# Patient Record
Sex: Male | Born: 1955 | Race: White | Hispanic: No | Marital: Married | Smoking: Never smoker
Health system: Southern US, Community
[De-identification: ages and names within clinical notes are randomized; demographics above are authoritative.]

## PROBLEM LIST (undated history)

## (undated) DIAGNOSIS — I499 Cardiac arrhythmia, unspecified: Secondary | ICD-10-CM

## (undated) DIAGNOSIS — M1990 Unspecified osteoarthritis, unspecified site: Secondary | ICD-10-CM

## (undated) DIAGNOSIS — Z87442 Personal history of urinary calculi: Secondary | ICD-10-CM

## (undated) DIAGNOSIS — N200 Calculus of kidney: Secondary | ICD-10-CM

## (undated) DIAGNOSIS — H00019 Hordeolum externum unspecified eye, unspecified eyelid: Secondary | ICD-10-CM

## (undated) DIAGNOSIS — G709 Myoneural disorder, unspecified: Secondary | ICD-10-CM

## (undated) DIAGNOSIS — C801 Malignant (primary) neoplasm, unspecified: Secondary | ICD-10-CM

## (undated) DIAGNOSIS — I639 Cerebral infarction, unspecified: Secondary | ICD-10-CM

## (undated) DIAGNOSIS — E785 Hyperlipidemia, unspecified: Secondary | ICD-10-CM

## (undated) DIAGNOSIS — D751 Secondary polycythemia: Secondary | ICD-10-CM

## (undated) DIAGNOSIS — G4730 Sleep apnea, unspecified: Secondary | ICD-10-CM

## (undated) DIAGNOSIS — I10 Essential (primary) hypertension: Secondary | ICD-10-CM

## (undated) DIAGNOSIS — K604 Rectal fistula, unspecified: Secondary | ICD-10-CM

## (undated) DIAGNOSIS — K5730 Diverticulosis of large intestine without perforation or abscess without bleeding: Secondary | ICD-10-CM

## (undated) DIAGNOSIS — D500 Iron deficiency anemia secondary to blood loss (chronic): Secondary | ICD-10-CM

## (undated) DIAGNOSIS — K589 Irritable bowel syndrome without diarrhea: Secondary | ICD-10-CM

## (undated) DIAGNOSIS — R011 Cardiac murmur, unspecified: Secondary | ICD-10-CM

## (undated) HISTORY — DX: Cardiac arrhythmia, unspecified: I49.9

## (undated) HISTORY — DX: Irritable bowel syndrome, unspecified: K58.9

## (undated) HISTORY — DX: Hordeolum externum unspecified eye, unspecified eyelid: H00.019

## (undated) HISTORY — DX: Rectal fistula, unspecified: K60.40

## (undated) HISTORY — DX: Calculus of kidney: N20.0

## (undated) HISTORY — DX: Iron deficiency anemia secondary to blood loss (chronic): D50.0

## (undated) HISTORY — DX: Secondary polycythemia: D75.1

## (undated) HISTORY — DX: Hyperlipidemia, unspecified: E78.5

## (undated) HISTORY — DX: Essential (primary) hypertension: I10

## (undated) HISTORY — PX: ROTATOR CUFF REPAIR: SHX139

## (undated) HISTORY — PX: VASECTOMY: SHX75

## (undated) HISTORY — DX: Diverticulosis of large intestine without perforation or abscess without bleeding: K57.30

## (undated) HISTORY — PX: RETINAL DETACHMENT SURGERY: SHX105

## (undated) HISTORY — DX: Rectal fistula: K60.4

---

## 1993-05-07 HISTORY — PX: ROTATOR CUFF REPAIR: SHX139

## 2001-10-24 ENCOUNTER — Ambulatory Visit (HOSPITAL_COMMUNITY): Admission: RE | Admit: 2001-10-24 | Discharge: 2001-10-24 | Payer: Self-pay | Admitting: Internal Medicine

## 2002-09-22 ENCOUNTER — Ambulatory Visit: Admission: RE | Admit: 2002-09-22 | Discharge: 2002-09-22 | Payer: Self-pay | Admitting: Hematology & Oncology

## 2002-09-22 ENCOUNTER — Encounter (INDEPENDENT_AMBULATORY_CARE_PROVIDER_SITE_OTHER): Payer: Self-pay | Admitting: Cardiology

## 2003-05-08 HISTORY — PX: RETINAL DETACHMENT SURGERY: SHX105

## 2004-02-08 ENCOUNTER — Ambulatory Visit (HOSPITAL_COMMUNITY): Admission: RE | Admit: 2004-02-08 | Discharge: 2004-02-09 | Payer: Self-pay | Admitting: Ophthalmology

## 2004-02-23 ENCOUNTER — Emergency Department (HOSPITAL_COMMUNITY): Admission: EM | Admit: 2004-02-23 | Discharge: 2004-02-23 | Payer: Self-pay | Admitting: *Deleted

## 2004-02-23 ENCOUNTER — Encounter (INDEPENDENT_AMBULATORY_CARE_PROVIDER_SITE_OTHER): Payer: Self-pay | Admitting: *Deleted

## 2004-03-09 ENCOUNTER — Ambulatory Visit: Payer: Self-pay | Admitting: Hematology & Oncology

## 2004-05-11 ENCOUNTER — Ambulatory Visit: Payer: Self-pay | Admitting: Hematology & Oncology

## 2004-06-30 ENCOUNTER — Ambulatory Visit: Payer: Self-pay | Admitting: Hematology & Oncology

## 2004-08-15 ENCOUNTER — Ambulatory Visit: Payer: Self-pay | Admitting: Hematology & Oncology

## 2004-09-04 ENCOUNTER — Ambulatory Visit (HOSPITAL_COMMUNITY): Admission: RE | Admit: 2004-09-04 | Discharge: 2004-09-04 | Payer: Self-pay | Admitting: Hematology & Oncology

## 2004-10-11 ENCOUNTER — Ambulatory Visit: Payer: Self-pay | Admitting: Hematology & Oncology

## 2004-12-05 ENCOUNTER — Ambulatory Visit: Payer: Self-pay | Admitting: Hematology & Oncology

## 2005-01-30 ENCOUNTER — Ambulatory Visit: Payer: Self-pay | Admitting: Hematology & Oncology

## 2005-03-26 ENCOUNTER — Ambulatory Visit: Payer: Self-pay | Admitting: Hematology & Oncology

## 2005-05-24 ENCOUNTER — Ambulatory Visit: Payer: Self-pay | Admitting: Hematology & Oncology

## 2005-07-18 ENCOUNTER — Ambulatory Visit: Payer: Self-pay | Admitting: Hematology & Oncology

## 2005-08-23 LAB — CBC WITH DIFFERENTIAL/PLATELET
BASO%: 1.5 % (ref 0.0–2.0)
Basophils Absolute: 0.1 10*3/uL (ref 0.0–0.1)
EOS%: 1.7 % (ref 0.0–7.0)
Eosinophils Absolute: 0.1 10*3/uL (ref 0.0–0.5)
HCT: 41.7 % (ref 38.7–49.9)
HGB: 14 g/dL (ref 13.0–17.1)
LYMPH%: 14.4 % (ref 14.0–48.0)
MCH: 27.4 pg — ABNORMAL LOW (ref 28.0–33.4)
MCHC: 33.5 g/dL (ref 32.0–35.9)
MCV: 81.8 fL (ref 81.6–98.0)
MONO#: 0.3 10*3/uL (ref 0.1–0.9)
MONO%: 7.8 % (ref 0.0–13.0)
NEUT#: 2.8 10*3/uL (ref 1.5–6.5)
NEUT%: 74.6 % (ref 40.0–75.0)
Platelets: 435 10*3/uL — ABNORMAL HIGH (ref 145–400)
RBC: 5.1 10*6/uL (ref 4.20–5.71)
RDW: 31.7 % — ABNORMAL HIGH (ref 11.2–14.6)
WBC: 3.7 10*3/uL — ABNORMAL LOW (ref 4.0–10.0)
lymph#: 0.5 10*3/uL — ABNORMAL LOW (ref 0.9–3.3)

## 2005-08-23 LAB — CHCC SMEAR

## 2005-08-23 LAB — FERRITIN: Ferritin: 10 ng/mL — ABNORMAL LOW (ref 22–322)

## 2005-08-30 ENCOUNTER — Ambulatory Visit (HOSPITAL_COMMUNITY): Admission: RE | Admit: 2005-08-30 | Discharge: 2005-08-30 | Payer: Self-pay | Admitting: Hematology & Oncology

## 2005-09-29 ENCOUNTER — Ambulatory Visit: Payer: Self-pay | Admitting: Hematology & Oncology

## 2005-10-04 LAB — CBC WITH DIFFERENTIAL/PLATELET
BASO%: 0.5 % (ref 0.0–2.0)
Basophils Absolute: 0 10e3/uL (ref 0.0–0.1)
EOS%: 2.3 % (ref 0.0–7.0)
Eosinophils Absolute: 0.1 10e3/uL (ref 0.0–0.5)
HCT: 43.2 % (ref 38.7–49.9)
HGB: 14.7 g/dL (ref 13.0–17.1)
LYMPH%: 20.7 % (ref 14.0–48.0)
MCH: 31.3 pg (ref 28.0–33.4)
MCHC: 34 g/dL (ref 32.0–35.9)
MCV: 91.9 fL (ref 81.6–98.0)
MONO#: 0.5 10e3/uL (ref 0.1–0.9)
MONO%: 9.2 % (ref 0.0–13.0)
NEUT#: 3.6 10e3/uL (ref 1.5–6.5)
NEUT%: 67.3 % (ref 40.0–75.0)
Platelets: 434 10e3/uL — ABNORMAL HIGH (ref 145–400)
RBC: 4.7 10e6/uL (ref 4.20–5.71)
RDW: 21.6 % — ABNORMAL HIGH (ref 11.2–14.6)
WBC: 5.3 10e3/uL (ref 4.0–10.0)
lymph#: 1.1 10e3/uL (ref 0.9–3.3)

## 2005-10-04 LAB — CHCC SMEAR

## 2005-11-26 ENCOUNTER — Ambulatory Visit: Payer: Self-pay | Admitting: Hematology & Oncology

## 2005-12-31 ENCOUNTER — Ambulatory Visit: Payer: Self-pay | Admitting: Internal Medicine

## 2006-01-11 ENCOUNTER — Ambulatory Visit: Payer: Self-pay | Admitting: Internal Medicine

## 2006-01-11 ENCOUNTER — Encounter (INDEPENDENT_AMBULATORY_CARE_PROVIDER_SITE_OTHER): Payer: Self-pay | Admitting: Specialist

## 2006-01-11 ENCOUNTER — Encounter (INDEPENDENT_AMBULATORY_CARE_PROVIDER_SITE_OTHER): Payer: Self-pay | Admitting: *Deleted

## 2006-01-22 ENCOUNTER — Ambulatory Visit: Payer: Self-pay | Admitting: Hematology & Oncology

## 2006-01-24 LAB — CBC WITH DIFFERENTIAL/PLATELET
BASO%: 1.7 % (ref 0.0–2.0)
Basophils Absolute: 0.1 10*3/uL (ref 0.0–0.1)
EOS%: 2.4 % (ref 0.0–7.0)
Eosinophils Absolute: 0.1 10*3/uL (ref 0.0–0.5)
HCT: 46.9 % (ref 38.7–49.9)
HGB: 16 g/dL (ref 13.0–17.1)
LYMPH%: 20.5 % (ref 14.0–48.0)
MCH: 30.3 pg (ref 28.0–33.4)
MCHC: 34 g/dL (ref 32.0–35.9)
MCV: 89 fL (ref 81.6–98.0)
MONO#: 0.4 10*3/uL (ref 0.1–0.9)
MONO%: 7.3 % (ref 0.0–13.0)
NEUT#: 3.4 10*3/uL (ref 1.5–6.5)
NEUT%: 68.1 % (ref 40.0–75.0)
Platelets: 515 10*3/uL — ABNORMAL HIGH (ref 145–400)
RBC: 5.27 10*6/uL (ref 4.20–5.71)
RDW: 15.7 % — ABNORMAL HIGH (ref 11.2–14.6)
WBC: 4.9 10*3/uL (ref 4.0–10.0)
lymph#: 1 10*3/uL (ref 0.9–3.3)

## 2006-01-24 LAB — CHCC SMEAR

## 2006-01-27 LAB — FERRITIN: Ferritin: 12 ng/mL — ABNORMAL LOW (ref 22–322)

## 2006-01-27 LAB — TRANSFERRIN RECEPTOR, SOLUABLE: Transferrin Receptor, Soluble: 9.6 mg/L — ABNORMAL HIGH (ref 2.2–5.0)

## 2006-03-19 ENCOUNTER — Ambulatory Visit: Payer: Self-pay | Admitting: Hematology & Oncology

## 2006-03-21 LAB — CBC WITH DIFFERENTIAL/PLATELET
BASO%: 1.4 % (ref 0.0–2.0)
Basophils Absolute: 0.1 10*3/uL (ref 0.0–0.1)
EOS%: 2.7 % (ref 0.0–7.0)
Eosinophils Absolute: 0.1 10*3/uL (ref 0.0–0.5)
HCT: 47.1 % (ref 38.7–49.9)
HGB: 15.9 g/dL (ref 13.0–17.1)
LYMPH%: 18 % (ref 14.0–48.0)
MCH: 30.5 pg (ref 28.0–33.4)
MCHC: 33.8 g/dL (ref 32.0–35.9)
MCV: 90.5 fL (ref 81.6–98.0)
MONO#: 0.5 10*3/uL (ref 0.1–0.9)
MONO%: 8.9 % (ref 0.0–13.0)
NEUT#: 3.8 10*3/uL (ref 1.5–6.5)
NEUT%: 69 % (ref 40.0–75.0)
Platelets: 440 10*3/uL — ABNORMAL HIGH (ref 145–400)
RBC: 5.2 10*6/uL (ref 4.20–5.71)
RDW: 18.2 % — ABNORMAL HIGH (ref 11.2–14.6)
WBC: 5.5 10*3/uL (ref 4.0–10.0)
lymph#: 1 10*3/uL (ref 0.9–3.3)

## 2006-04-18 LAB — COMPREHENSIVE METABOLIC PANEL
ALT: 18 U/L (ref 0–53)
AST: 18 U/L (ref 0–37)
Albumin: 5 g/dL (ref 3.5–5.2)
Alkaline Phosphatase: 44 U/L (ref 39–117)
BUN: 14 mg/dL (ref 6–23)
CO2: 25 mEq/L (ref 19–32)
Calcium: 9.3 mg/dL (ref 8.4–10.5)
Chloride: 105 mEq/L (ref 96–112)
Creatinine, Ser: 1.09 mg/dL (ref 0.40–1.50)
Glucose, Bld: 92 mg/dL (ref 70–99)
Potassium: 4 mEq/L (ref 3.5–5.3)
Sodium: 140 mEq/L (ref 135–145)
Total Bilirubin: 0.8 mg/dL (ref 0.3–1.2)
Total Protein: 6.7 g/dL (ref 6.0–8.3)

## 2006-04-18 LAB — CBC WITH DIFFERENTIAL/PLATELET
BASO%: 1.3 % (ref 0.0–2.0)
Basophils Absolute: 0.1 10*3/uL (ref 0.0–0.1)
EOS%: 2.4 % (ref 0.0–7.0)
Eosinophils Absolute: 0.1 10*3/uL (ref 0.0–0.5)
HCT: 46.3 % (ref 38.7–49.9)
HGB: 15.8 g/dL (ref 13.0–17.1)
LYMPH%: 18.3 % (ref 14.0–48.0)
MCH: 30.9 pg (ref 28.0–33.4)
MCHC: 34.1 g/dL (ref 32.0–35.9)
MCV: 90.7 fL (ref 81.6–98.0)
MONO#: 0.4 10*3/uL (ref 0.1–0.9)
MONO%: 7.6 % (ref 0.0–13.0)
NEUT#: 4 10*3/uL (ref 1.5–6.5)
NEUT%: 70.4 % (ref 40.0–75.0)
Platelets: 400 10*3/uL (ref 145–400)
RBC: 5.1 10*6/uL (ref 4.20–5.71)
RDW: 17 % — ABNORMAL HIGH (ref 11.2–14.6)
WBC: 5.7 10*3/uL (ref 4.0–10.0)
lymph#: 1 10*3/uL (ref 0.9–3.3)

## 2006-04-18 LAB — FERRITIN: Ferritin: 7 ng/mL — ABNORMAL LOW (ref 22–322)

## 2006-05-13 ENCOUNTER — Ambulatory Visit: Payer: Self-pay | Admitting: Hematology & Oncology

## 2006-05-16 LAB — CBC WITH DIFFERENTIAL/PLATELET
BASO%: 0.4 % (ref 0.0–2.0)
Basophils Absolute: 0 10*3/uL (ref 0.0–0.1)
EOS%: 1.8 % (ref 0.0–7.0)
Eosinophils Absolute: 0.1 10*3/uL (ref 0.0–0.5)
HCT: 41.9 % (ref 38.7–49.9)
HGB: 14.3 g/dL (ref 13.0–17.1)
LYMPH%: 12.7 % — ABNORMAL LOW (ref 14.0–48.0)
MCH: 31.3 pg (ref 28.0–33.4)
MCHC: 34.2 g/dL (ref 32.0–35.9)
MCV: 91.4 fL (ref 81.6–98.0)
MONO#: 0.4 10*3/uL (ref 0.1–0.9)
MONO%: 5.7 % (ref 0.0–13.0)
NEUT#: 5.2 10*3/uL (ref 1.5–6.5)
NEUT%: 79.4 % — ABNORMAL HIGH (ref 40.0–75.0)
Platelets: 373 10*3/uL (ref 145–400)
RBC: 4.59 10*6/uL (ref 4.20–5.71)
RDW: 17 % — ABNORMAL HIGH (ref 11.2–14.6)
WBC: 6.6 10*3/uL (ref 4.0–10.0)
lymph#: 0.8 10*3/uL — ABNORMAL LOW (ref 0.9–3.3)

## 2006-06-13 LAB — CBC WITH DIFFERENTIAL/PLATELET
BASO%: 1 % (ref 0.0–2.0)
Basophils Absolute: 0.1 10*3/uL (ref 0.0–0.1)
EOS%: 2.1 % (ref 0.0–7.0)
Eosinophils Absolute: 0.1 10*3/uL (ref 0.0–0.5)
HCT: 41.2 % (ref 38.7–49.9)
HGB: 14.3 g/dL (ref 13.0–17.1)
LYMPH%: 19.7 % (ref 14.0–48.0)
MCH: 31.4 pg (ref 28.0–33.4)
MCHC: 34.7 g/dL (ref 32.0–35.9)
MCV: 90.4 fL (ref 81.6–98.0)
MONO#: 0.4 10*3/uL (ref 0.1–0.9)
MONO%: 7.2 % (ref 0.0–13.0)
NEUT#: 3.7 10*3/uL (ref 1.5–6.5)
NEUT%: 70 % (ref 40.0–75.0)
Platelets: 426 10*3/uL — ABNORMAL HIGH (ref 145–400)
RBC: 4.55 10*6/uL (ref 4.20–5.71)
RDW: 15.6 % — ABNORMAL HIGH (ref 11.2–14.6)
WBC: 5.3 10*3/uL (ref 4.0–10.0)
lymph#: 1 10*3/uL (ref 0.9–3.3)

## 2006-07-08 ENCOUNTER — Ambulatory Visit: Payer: Self-pay | Admitting: Hematology & Oncology

## 2006-07-11 LAB — CBC WITH DIFFERENTIAL/PLATELET
BASO%: 1.5 % (ref 0.0–2.0)
Basophils Absolute: 0.1 10*3/uL (ref 0.0–0.1)
EOS%: 3.9 % (ref 0.0–7.0)
Eosinophils Absolute: 0.2 10*3/uL (ref 0.0–0.5)
HCT: 45.3 % (ref 38.7–49.9)
HGB: 15.8 g/dL (ref 13.0–17.1)
LYMPH%: 21 % (ref 14.0–48.0)
MCH: 30.9 pg (ref 28.0–33.4)
MCHC: 35 g/dL (ref 32.0–35.9)
MCV: 88.3 fL (ref 81.6–98.0)
MONO#: 0.5 10*3/uL (ref 0.1–0.9)
MONO%: 8.5 % (ref 0.0–13.0)
NEUT#: 3.9 10*3/uL (ref 1.5–6.5)
NEUT%: 65.1 % (ref 40.0–75.0)
Platelets: 494 10*3/uL — ABNORMAL HIGH (ref 145–400)
RBC: 5.12 10*6/uL (ref 4.20–5.71)
RDW: 14.4 % (ref 11.2–14.6)
WBC: 6.1 10*3/uL (ref 4.0–10.0)
lymph#: 1.3 10*3/uL (ref 0.9–3.3)

## 2006-08-20 ENCOUNTER — Ambulatory Visit: Payer: Self-pay | Admitting: Hematology & Oncology

## 2006-08-22 LAB — CBC WITH DIFFERENTIAL/PLATELET
BASO%: 2.7 % — ABNORMAL HIGH (ref 0.0–2.0)
Basophils Absolute: 0.2 10*3/uL — ABNORMAL HIGH (ref 0.0–0.1)
EOS%: 2.1 % (ref 0.0–7.0)
Eosinophils Absolute: 0.1 10*3/uL (ref 0.0–0.5)
HCT: 41.5 % (ref 38.7–49.9)
HGB: 14.4 g/dL (ref 13.0–17.1)
LYMPH%: 16.2 % (ref 14.0–48.0)
MCH: 29.3 pg (ref 28.0–33.4)
MCHC: 34.8 g/dL (ref 32.0–35.9)
MCV: 84.4 fL (ref 81.6–98.0)
MONO#: 0.4 10*3/uL (ref 0.1–0.9)
MONO%: 6.9 % (ref 0.0–13.0)
NEUT#: 4.5 10*3/uL (ref 1.5–6.5)
NEUT%: 72.1 % (ref 40.0–75.0)
Platelets: 384 10*3/uL (ref 145–400)
RBC: 4.92 10*6/uL (ref 4.20–5.71)
RDW: 12.8 % (ref 11.2–14.6)
WBC: 6.3 10*3/uL (ref 4.0–10.0)
lymph#: 1 10*3/uL (ref 0.9–3.3)

## 2006-10-10 ENCOUNTER — Ambulatory Visit: Payer: Self-pay | Admitting: Hematology & Oncology

## 2006-10-15 LAB — CBC WITH DIFFERENTIAL/PLATELET
BASO%: 2.6 % — ABNORMAL HIGH (ref 0.0–2.0)
Basophils Absolute: 0.2 10*3/uL — ABNORMAL HIGH (ref 0.0–0.1)
EOS%: 2.7 % (ref 0.0–7.0)
Eosinophils Absolute: 0.2 10*3/uL (ref 0.0–0.5)
HCT: 48.1 % (ref 38.7–49.9)
HGB: 15.9 g/dL (ref 13.0–17.1)
LYMPH%: 13.8 % — ABNORMAL LOW (ref 14.0–48.0)
MCH: 27.3 pg — ABNORMAL LOW (ref 28.0–33.4)
MCHC: 33.1 g/dL (ref 32.0–35.9)
MCV: 82.4 fL (ref 81.6–98.0)
MONO#: 0.5 10*3/uL (ref 0.1–0.9)
MONO%: 6.5 % (ref 0.0–13.0)
NEUT#: 5.4 10*3/uL (ref 1.5–6.5)
NEUT%: 74.4 % (ref 40.0–75.0)
Platelets: 303 10*3/uL (ref 145–400)
RBC: 5.83 10*6/uL — ABNORMAL HIGH (ref 4.20–5.71)
RDW: 14.4 % (ref 11.2–14.6)
WBC: 7.3 10*3/uL (ref 4.0–10.0)
lymph#: 1 10*3/uL (ref 0.9–3.3)

## 2006-12-25 ENCOUNTER — Ambulatory Visit: Payer: Self-pay | Admitting: Hematology & Oncology

## 2006-12-27 LAB — CBC & DIFF AND RETIC
BASO%: 1.9 % (ref 0.0–2.0)
Basophils Absolute: 0.1 10*3/uL (ref 0.0–0.1)
EOS%: 2.6 % (ref 0.0–7.0)
Eosinophils Absolute: 0.1 10*3/uL (ref 0.0–0.5)
HCT: 44.4 % (ref 38.7–49.9)
HGB: 15.6 g/dL (ref 13.0–17.1)
IRF: 0.34 (ref 0.070–0.380)
LYMPH%: 20.9 % (ref 14.0–48.0)
MCH: 30.1 pg (ref 28.0–33.4)
MCHC: 35.2 g/dL (ref 32.0–35.9)
MCV: 85.6 fL (ref 81.6–98.0)
MONO#: 0.4 10*3/uL (ref 0.1–0.9)
MONO%: 6.7 % (ref 0.0–13.0)
NEUT#: 3.9 10*3/uL (ref 1.5–6.5)
NEUT%: 67.9 % (ref 40.0–75.0)
Platelets: 365 10*3/uL (ref 145–400)
RBC: 5.19 10*6/uL (ref 4.20–5.71)
RDW: 21.8 % — ABNORMAL HIGH (ref 11.2–14.6)
RETIC #: 90.8 10*3/uL (ref 31.8–103.9)
Retic %: 1.8 % (ref 0.7–2.3)
WBC: 5.7 10*3/uL (ref 4.0–10.0)
lymph#: 1.2 10*3/uL (ref 0.9–3.3)

## 2006-12-27 LAB — CHCC SMEAR

## 2006-12-28 LAB — FERRITIN: Ferritin: 9 ng/mL — ABNORMAL LOW (ref 22–322)

## 2007-02-18 ENCOUNTER — Ambulatory Visit: Payer: Self-pay | Admitting: Hematology & Oncology

## 2007-02-20 LAB — CBC WITH DIFFERENTIAL/PLATELET
BASO%: 1.2 % (ref 0.0–2.0)
Basophils Absolute: 0.1 10*3/uL (ref 0.0–0.1)
EOS%: 2.2 % (ref 0.0–7.0)
Eosinophils Absolute: 0.1 10*3/uL (ref 0.0–0.5)
HCT: 43.5 % (ref 38.7–49.9)
HGB: 15.3 g/dL (ref 13.0–17.1)
LYMPH%: 20.5 % (ref 14.0–48.0)
MCH: 32.9 pg (ref 28.0–33.4)
MCHC: 35.2 g/dL (ref 32.0–35.9)
MCV: 93.3 fL (ref 81.6–98.0)
MONO#: 0.5 10*3/uL (ref 0.1–0.9)
MONO%: 10 % (ref 0.0–13.0)
NEUT#: 3.5 10*3/uL (ref 1.5–6.5)
NEUT%: 66.1 % (ref 40.0–75.0)
Platelets: 364 10*3/uL (ref 145–400)
RBC: 4.66 10*6/uL (ref 4.20–5.71)
RDW: 20.7 % — ABNORMAL HIGH (ref 11.2–14.6)
WBC: 5.3 10*3/uL (ref 4.0–10.0)
lymph#: 1.1 10*3/uL (ref 0.9–3.3)

## 2007-04-22 ENCOUNTER — Ambulatory Visit: Payer: Self-pay | Admitting: Hematology & Oncology

## 2007-04-24 LAB — CBC WITH DIFFERENTIAL/PLATELET
BASO%: 2.1 % — ABNORMAL HIGH (ref 0.0–2.0)
Basophils Absolute: 0.1 10*3/uL (ref 0.0–0.1)
EOS%: 2.2 % (ref 0.0–7.0)
Eosinophils Absolute: 0.1 10*3/uL (ref 0.0–0.5)
HCT: UNDETERMINED % (ref 38.7–49.9)
HGB: 14.4 g/dL (ref 13.0–17.1)
LYMPH%: 18.4 % (ref 14.0–48.0)
MCH: UNDETERMINED pg (ref 28.0–33.4)
MCHC: UNDETERMINED g/dL (ref 32.0–35.9)
MCV: UNDETERMINED fL (ref 81.6–98.0)
MONO#: 0.4 10*3/uL (ref 0.1–0.9)
MONO%: 7.1 % (ref 0.0–13.0)
NEUT#: 3.5 10*3/uL (ref 1.5–6.5)
NEUT%: 70.2 % (ref 40.0–75.0)
Platelets: 342 10*3/uL (ref 145–400)
RBC: UNDETERMINED 10*6/uL (ref 4.20–5.71)
RDW: 15.2 % — ABNORMAL HIGH (ref 11.2–14.6)
WBC: 5 10*3/uL (ref 4.0–10.0)
lymph#: 0.9 10*3/uL (ref 0.9–3.3)

## 2007-04-24 LAB — FERRITIN: Ferritin: 51 ng/mL (ref 22–322)

## 2007-04-24 LAB — CHCC SMEAR

## 2007-06-24 ENCOUNTER — Ambulatory Visit: Payer: Self-pay | Admitting: Hematology & Oncology

## 2007-06-26 LAB — CBC WITH DIFFERENTIAL/PLATELET
BASO%: 0.6 % (ref 0.0–2.0)
Basophils Absolute: 0 10*3/uL (ref 0.0–0.1)
EOS%: 2.3 % (ref 0.0–7.0)
Eosinophils Absolute: 0.1 10*3/uL (ref 0.0–0.5)
HCT: 43.6 % (ref 38.7–49.9)
HGB: 15.9 g/dL (ref 13.0–17.1)
LYMPH%: 11.8 % — ABNORMAL LOW (ref 14.0–48.0)
MCH: 39.2 pg — ABNORMAL HIGH (ref 28.0–33.4)
MCHC: 36.5 g/dL — ABNORMAL HIGH (ref 32.0–35.9)
MCV: 107.3 fL — ABNORMAL HIGH (ref 81.6–98.0)
MONO#: 0.5 10*3/uL (ref 0.1–0.9)
MONO%: 7.9 % (ref 0.0–13.0)
NEUT#: 5.1 10*3/uL (ref 1.5–6.5)
NEUT%: 77.4 % — ABNORMAL HIGH (ref 40.0–75.0)
Platelets: 478 10*3/uL — ABNORMAL HIGH (ref 145–400)
RBC: 4.07 10*6/uL — ABNORMAL LOW (ref 4.20–5.71)
RDW: 13.3 % (ref 11.2–14.6)
WBC: 6.6 10*3/uL (ref 4.0–10.0)
lymph#: 0.8 10*3/uL — ABNORMAL LOW (ref 0.9–3.3)

## 2007-07-14 ENCOUNTER — Ambulatory Visit (HOSPITAL_COMMUNITY): Admission: RE | Admit: 2007-07-14 | Discharge: 2007-07-14 | Payer: Self-pay | Admitting: Hematology & Oncology

## 2007-07-14 ENCOUNTER — Encounter (INDEPENDENT_AMBULATORY_CARE_PROVIDER_SITE_OTHER): Payer: Self-pay | Admitting: *Deleted

## 2007-08-20 ENCOUNTER — Ambulatory Visit: Payer: Self-pay | Admitting: Hematology & Oncology

## 2007-08-22 LAB — CBC WITH DIFFERENTIAL/PLATELET
BASO%: 0.4 % (ref 0.0–2.0)
Basophils Absolute: 0 10*3/uL (ref 0.0–0.1)
EOS%: 1.9 % (ref 0.0–7.0)
Eosinophils Absolute: 0.1 10*3/uL (ref 0.0–0.5)
HCT: 44.6 % (ref 38.7–49.9)
HGB: 16.1 g/dL (ref 13.0–17.1)
LYMPH%: 17.4 % (ref 14.0–48.0)
MCH: 38.1 pg — ABNORMAL HIGH (ref 28.0–33.4)
MCHC: 36.1 g/dL — ABNORMAL HIGH (ref 32.0–35.9)
MCV: 105.6 fL — ABNORMAL HIGH (ref 81.6–98.0)
MONO#: 0.3 10*3/uL (ref 0.1–0.9)
MONO%: 5.2 % (ref 0.0–13.0)
NEUT#: 3.7 10*3/uL (ref 1.5–6.5)
NEUT%: 75.1 % — ABNORMAL HIGH (ref 40.0–75.0)
Platelets: 465 10*3/uL — ABNORMAL HIGH (ref 145–400)
RBC: 4.23 10*6/uL (ref 4.20–5.71)
RDW: 14.2 % (ref 11.2–14.6)
WBC: 5 10*3/uL (ref 4.0–10.0)
lymph#: 0.9 10*3/uL (ref 0.9–3.3)

## 2007-08-22 LAB — CHCC SMEAR

## 2007-09-03 LAB — JAK2 GENOTYPR

## 2007-09-03 LAB — FERRITIN: Ferritin: 67 ng/mL (ref 22–322)

## 2007-10-21 ENCOUNTER — Ambulatory Visit: Payer: Self-pay | Admitting: Hematology & Oncology

## 2007-10-23 LAB — FERRITIN: Ferritin: 13 ng/mL — ABNORMAL LOW (ref 22–322)

## 2007-10-23 LAB — CBC WITH DIFFERENTIAL/PLATELET
BASO%: 1.3 % (ref 0.0–2.0)
Basophils Absolute: 0.1 10*3/uL (ref 0.0–0.1)
EOS%: 2.1 % (ref 0.0–7.0)
Eosinophils Absolute: 0.1 10*3/uL (ref 0.0–0.5)
HCT: 48 % (ref 38.7–49.9)
HGB: 16.7 g/dL (ref 13.0–17.1)
LYMPH%: 15.6 % (ref 14.0–48.0)
MCH: 35 pg — ABNORMAL HIGH (ref 28.0–33.4)
MCHC: 34.8 g/dL (ref 32.0–35.9)
MCV: 100.6 fL — ABNORMAL HIGH (ref 81.6–98.0)
MONO#: 0.5 10*3/uL (ref 0.1–0.9)
MONO%: 8.7 % (ref 0.0–13.0)
NEUT#: 4.3 10*3/uL (ref 1.5–6.5)
NEUT%: 72.3 % (ref 40.0–75.0)
Platelets: 394 10*3/uL (ref 145–400)
RBC: 4.77 10*6/uL (ref 4.20–5.71)
RDW: 14.4 % (ref 11.2–14.6)
WBC: 6 10*3/uL (ref 4.0–10.0)
lymph#: 0.9 10*3/uL (ref 0.9–3.3)

## 2007-11-06 LAB — CBC WITH DIFFERENTIAL/PLATELET
BASO%: 0.4 % (ref 0.0–2.0)
Basophils Absolute: 0 10*3/uL (ref 0.0–0.1)
EOS%: 2.6 % (ref 0.0–7.0)
Eosinophils Absolute: 0.2 10*3/uL (ref 0.0–0.5)
HCT: 43.9 % (ref 38.7–49.9)
HGB: 15.2 g/dL (ref 13.0–17.1)
LYMPH%: 16.4 % (ref 14.0–48.0)
MCH: 33.1 pg (ref 28.0–33.4)
MCHC: 34.7 g/dL (ref 32.0–35.9)
MCV: 95.3 fL (ref 81.6–98.0)
MONO#: 0.5 10*3/uL (ref 0.1–0.9)
MONO%: 7.1 % (ref 0.0–13.0)
NEUT#: 5.4 10*3/uL (ref 1.5–6.5)
NEUT%: 73.5 % (ref 40.0–75.0)
Platelets: 507 10*3/uL — ABNORMAL HIGH (ref 145–400)
RBC: 4.61 10*6/uL (ref 4.20–5.71)
RDW: 14.9 % — ABNORMAL HIGH (ref 11.2–14.6)
WBC: 7.3 10*3/uL (ref 4.0–10.0)
lymph#: 1.2 10*3/uL (ref 0.9–3.3)

## 2007-12-23 ENCOUNTER — Ambulatory Visit: Payer: Self-pay | Admitting: Hematology & Oncology

## 2007-12-25 LAB — CBC WITH DIFFERENTIAL (CANCER CENTER ONLY)
BASO#: 0 10*3/uL (ref 0.0–0.2)
BASO%: 0.4 % (ref 0.0–2.0)
EOS%: 4.4 % (ref 0.0–7.0)
Eosinophils Absolute: 0.2 10*3/uL (ref 0.0–0.5)
HCT: 45.6 % (ref 38.7–49.9)
HGB: 15.4 g/dL (ref 13.0–17.1)
LYMPH#: 0.9 10*3/uL (ref 0.9–3.3)
LYMPH%: 21.5 % (ref 14.0–48.0)
MCH: 28.9 pg (ref 28.0–33.4)
MCHC: 33.8 g/dL (ref 32.0–35.9)
MCV: 86 fL (ref 82–98)
MONO#: 0.3 10*3/uL (ref 0.1–0.9)
MONO%: 7.4 % (ref 0.0–13.0)
NEUT#: 2.7 10*3/uL (ref 1.5–6.5)
NEUT%: 66.3 % (ref 40.0–80.0)
Platelets: 337 10*3/uL (ref 145–400)
RBC: 5.32 10*6/uL (ref 4.20–5.70)
RDW: 14.7 % — ABNORMAL HIGH (ref 10.5–14.6)
WBC: 4.1 10*3/uL (ref 4.0–10.0)

## 2007-12-25 LAB — FERRITIN: Ferritin: 7 ng/mL — ABNORMAL LOW (ref 22–322)

## 2007-12-25 LAB — CHCC SATELLITE - SMEAR

## 2008-02-11 ENCOUNTER — Ambulatory Visit: Payer: Self-pay | Admitting: Hematology & Oncology

## 2008-02-12 LAB — CBC WITH DIFFERENTIAL (CANCER CENTER ONLY)
HCT: 47.3 % (ref 38.7–49.9)
HGB: 16 g/dL (ref 13.0–17.1)
MCH: 28 pg (ref 28.0–33.4)
MCHC: 33.9 g/dL (ref 32.0–35.9)
MCV: 83 fL (ref 82–98)
Platelets: 291 10*3/uL (ref 145–400)
RBC: 5.72 10*6/uL — ABNORMAL HIGH (ref 4.20–5.70)
RDW: 19.7 % — ABNORMAL HIGH (ref 10.5–14.6)
WBC: 4.7 10*3/uL (ref 4.0–10.0)

## 2008-02-12 LAB — MANUAL DIFFERENTIAL (CHCC SATELLITE)
ALC: 1.4 10*3/uL (ref 0.9–3.3)
ANC (CHCC HP manual diff): 2.9 10*3/uL (ref 1.5–6.5)
BASO: 2 % (ref 0–2)
Eos: 4 % (ref 0–7)
LYMPH: 29 % (ref 14–48)
MONO: 4 % (ref 0–13)
PLT EST ~~LOC~~: ADEQUATE
SEG: 61 % (ref 40–75)

## 2008-04-07 ENCOUNTER — Ambulatory Visit: Payer: Self-pay | Admitting: Hematology & Oncology

## 2008-04-08 LAB — CBC WITH DIFFERENTIAL (CANCER CENTER ONLY)
BASO#: 0.1 10*3/uL (ref 0.0–0.2)
BASO%: 1.4 % (ref 0.0–2.0)
EOS%: 3.7 % (ref 0.0–7.0)
Eosinophils Absolute: 0.3 10*3/uL (ref 0.0–0.5)
HCT: 46.6 % (ref 38.7–49.9)
HGB: 15.7 g/dL (ref 13.0–17.1)
LYMPH#: 1.4 10*3/uL (ref 0.9–3.3)
LYMPH%: 19.1 % (ref 14.0–48.0)
MCH: 28.5 pg (ref 28.0–33.4)
MCHC: 33.8 g/dL (ref 32.0–35.9)
MCV: 84 fL (ref 82–98)
MONO#: 0.5 10*3/uL (ref 0.1–0.9)
MONO%: 6.7 % (ref 0.0–13.0)
NEUT#: 5.2 10*3/uL (ref 1.5–6.5)
NEUT%: 69.1 % (ref 40.0–80.0)
Platelets: 456 10*3/uL — ABNORMAL HIGH (ref 145–400)
RBC: 5.53 10*6/uL (ref 4.20–5.70)
RDW: 14.5 % (ref 10.5–14.6)
WBC: 7.5 10*3/uL (ref 4.0–10.0)

## 2008-04-08 LAB — FERRITIN: Ferritin: 9 ng/mL — ABNORMAL LOW (ref 22–322)

## 2008-04-08 LAB — CHCC SATELLITE - SMEAR

## 2008-04-09 LAB — URINALYSIS, MICROSCOPIC (CHCC SATELLITE)
Bacteria, UA: NEGATIVE
Bilirubin (Urine): NEGATIVE
Blood: NEGATIVE
Glucose: NEGATIVE g/dL
Ketones: 5 mg/dL
Leukocyte Esterase: NEGATIVE
Nitrite: NEGATIVE
Protein: <30 mg/dL
RBC: NEGATIVE (ref 0–2)
Specific Gravity, Urine: 1.015 (ref 1.003–1.035)
pH: 6 (ref 4.60–8.00)

## 2008-04-11 LAB — URINE CULTURE

## 2008-04-15 LAB — CBC WITH DIFFERENTIAL (CANCER CENTER ONLY)
BASO#: 0 10*3/uL (ref 0.0–0.2)
BASO%: 0.6 % (ref 0.0–2.0)
EOS%: 4 % (ref 0.0–7.0)
Eosinophils Absolute: 0.3 10*3/uL (ref 0.0–0.5)
HCT: 44.4 % (ref 38.7–49.9)
HGB: 14.9 g/dL (ref 13.0–17.1)
LYMPH#: 1.1 10*3/uL (ref 0.9–3.3)
LYMPH%: 15.2 % (ref 14.0–48.0)
MCH: 27.8 pg — ABNORMAL LOW (ref 28.0–33.4)
MCHC: 33.5 g/dL (ref 32.0–35.9)
MCV: 83 fL (ref 82–98)
MONO#: 0.6 10*3/uL (ref 0.1–0.9)
MONO%: 8.1 % (ref 0.0–13.0)
NEUT#: 5.1 10*3/uL (ref 1.5–6.5)
NEUT%: 72.1 % (ref 40.0–80.0)
Platelets: 577 10*3/uL — ABNORMAL HIGH (ref 145–400)
RBC: 5.34 10*6/uL (ref 4.20–5.70)
RDW: 13.8 % (ref 10.5–14.6)
WBC: 7.1 10*3/uL (ref 4.0–10.0)

## 2008-04-22 LAB — CBC WITH DIFFERENTIAL (CANCER CENTER ONLY)
BASO#: 0.1 10*3/uL (ref 0.0–0.2)
BASO%: 0.9 % (ref 0.0–2.0)
EOS%: 3.6 % (ref 0.0–7.0)
Eosinophils Absolute: 0.3 10*3/uL (ref 0.0–0.5)
HCT: 42.2 % (ref 38.7–49.9)
HGB: 14.2 g/dL (ref 13.0–17.1)
LYMPH#: 1.3 10*3/uL (ref 0.9–3.3)
LYMPH%: 18.6 % (ref 14.0–48.0)
MCH: 27.4 pg — ABNORMAL LOW (ref 28.0–33.4)
MCHC: 33.6 g/dL (ref 32.0–35.9)
MCV: 82 fL (ref 82–98)
MONO#: 0.5 10*3/uL (ref 0.1–0.9)
MONO%: 7.1 % (ref 0.0–13.0)
NEUT#: 4.9 10*3/uL (ref 1.5–6.5)
NEUT%: 69.8 % (ref 40.0–80.0)
Platelets: 572 10*3/uL — ABNORMAL HIGH (ref 145–400)
RBC: 5.18 10*6/uL (ref 4.20–5.70)
RDW: 14.5 % (ref 10.5–14.6)
WBC: 7 10*3/uL (ref 4.0–10.0)

## 2008-05-19 LAB — FECAL OCCULT BLOOD, GUIAC - CHCC SATELLITE: Occult Blood: NEGATIVE

## 2008-05-26 ENCOUNTER — Ambulatory Visit: Payer: Self-pay | Admitting: Hematology & Oncology

## 2008-05-27 ENCOUNTER — Encounter: Payer: Self-pay | Admitting: Internal Medicine

## 2008-06-03 DIAGNOSIS — D751 Secondary polycythemia: Secondary | ICD-10-CM | POA: Insufficient documentation

## 2008-06-03 DIAGNOSIS — K5730 Diverticulosis of large intestine without perforation or abscess without bleeding: Secondary | ICD-10-CM | POA: Insufficient documentation

## 2008-06-03 DIAGNOSIS — E785 Hyperlipidemia, unspecified: Secondary | ICD-10-CM | POA: Insufficient documentation

## 2008-06-08 ENCOUNTER — Ambulatory Visit: Payer: Self-pay | Admitting: Internal Medicine

## 2008-06-08 DIAGNOSIS — K625 Hemorrhage of anus and rectum: Secondary | ICD-10-CM | POA: Insufficient documentation

## 2008-06-08 DIAGNOSIS — K5900 Constipation, unspecified: Secondary | ICD-10-CM | POA: Insufficient documentation

## 2008-06-29 ENCOUNTER — Ambulatory Visit: Payer: Self-pay | Admitting: Internal Medicine

## 2008-06-29 ENCOUNTER — Encounter: Payer: Self-pay | Admitting: Internal Medicine

## 2008-07-01 ENCOUNTER — Encounter: Payer: Self-pay | Admitting: Internal Medicine

## 2008-07-01 LAB — CBC WITH DIFFERENTIAL (CANCER CENTER ONLY)
BASO#: 0 10*3/uL (ref 0.0–0.2)
BASO%: 0.7 % (ref 0.0–2.0)
EOS%: 4.9 % (ref 0.0–7.0)
Eosinophils Absolute: 0.3 10*3/uL (ref 0.0–0.5)
HCT: 43.5 % (ref 38.7–49.9)
HGB: 14.1 g/dL (ref 13.0–17.1)
LYMPH#: 1.2 10*3/uL (ref 0.9–3.3)
LYMPH%: 19.8 % (ref 14.0–48.0)
MCH: 25.3 pg — ABNORMAL LOW (ref 28.0–33.4)
MCHC: 32.4 g/dL (ref 32.0–35.9)
MCV: 78 fL — ABNORMAL LOW (ref 82–98)
MONO#: 0.4 10*3/uL (ref 0.1–0.9)
MONO%: 6.5 % (ref 0.0–13.0)
NEUT#: 4.1 10*3/uL (ref 1.5–6.5)
NEUT%: 68.1 % (ref 40.0–80.0)
Platelets: 362 10*3/uL (ref 145–400)
RBC: 5.57 10*6/uL (ref 4.20–5.70)
RDW: 15.6 % — ABNORMAL HIGH (ref 10.5–14.6)
WBC: 6 10*3/uL (ref 4.0–10.0)

## 2008-07-04 ENCOUNTER — Encounter: Payer: Self-pay | Admitting: Internal Medicine

## 2008-08-10 ENCOUNTER — Ambulatory Visit: Payer: Self-pay | Admitting: Hematology & Oncology

## 2008-08-12 LAB — CBC WITH DIFFERENTIAL (CANCER CENTER ONLY)
BASO#: 0.1 10*3/uL (ref 0.0–0.2)
BASO%: 0.8 % (ref 0.0–2.0)
EOS%: 4.3 % (ref 0.0–7.0)
Eosinophils Absolute: 0.4 10*3/uL (ref 0.0–0.5)
HCT: 46.3 % (ref 38.7–49.9)
HGB: 15 g/dL (ref 13.0–17.1)
LYMPH#: 1.4 10*3/uL (ref 0.9–3.3)
LYMPH%: 15.8 % (ref 14.0–48.0)
MCH: 24.4 pg — ABNORMAL LOW (ref 28.0–33.4)
MCHC: 32.4 g/dL (ref 32.0–35.9)
MCV: 75 fL — ABNORMAL LOW (ref 82–98)
MONO#: 0.5 10*3/uL (ref 0.1–0.9)
MONO%: 5.5 % (ref 0.0–13.0)
NEUT#: 6.4 10*3/uL (ref 1.5–6.5)
NEUT%: 73.6 % (ref 40.0–80.0)
Platelets: 674 10*3/uL — ABNORMAL HIGH (ref 145–400)
RBC: 6.15 10*6/uL — ABNORMAL HIGH (ref 4.20–5.70)
RDW: 14.7 % — ABNORMAL HIGH (ref 10.5–14.6)
WBC: 8.8 10*3/uL (ref 4.0–10.0)

## 2008-08-12 LAB — LACTATE DEHYDROGENASE: LDH: 283 U/L — ABNORMAL HIGH (ref 94–250)

## 2008-08-12 LAB — CHCC SATELLITE - SMEAR

## 2008-08-12 LAB — TECHNOLOGIST REVIEW CHCC SATELLITE: Tech Review: 1

## 2008-08-12 LAB — FERRITIN: Ferritin: 4 ng/mL — ABNORMAL LOW (ref 22–322)

## 2008-08-19 LAB — CBC WITH DIFFERENTIAL (CANCER CENTER ONLY)
BASO#: 0 10*3/uL (ref 0.0–0.2)
BASO%: 0.5 % (ref 0.0–2.0)
EOS%: 4.6 % (ref 0.0–7.0)
Eosinophils Absolute: 0.3 10*3/uL (ref 0.0–0.5)
HCT: 43.5 % (ref 38.7–49.9)
HGB: 14 g/dL (ref 13.0–17.1)
LYMPH#: 1.4 10*3/uL (ref 0.9–3.3)
LYMPH%: 20.4 % (ref 14.0–48.0)
MCH: 23.8 pg — ABNORMAL LOW (ref 28.0–33.4)
MCHC: 32.3 g/dL (ref 32.0–35.9)
MCV: 74 fL — ABNORMAL LOW (ref 82–98)
MONO#: 0.4 10*3/uL (ref 0.1–0.9)
MONO%: 5.4 % (ref 0.0–13.0)
NEUT#: 4.7 10*3/uL (ref 1.5–6.5)
NEUT%: 69.1 % (ref 40.0–80.0)
Platelets: 416 10*3/uL — ABNORMAL HIGH (ref 145–400)
RBC: 5.88 10*6/uL — ABNORMAL HIGH (ref 4.20–5.70)
RDW: 15.1 % — ABNORMAL HIGH (ref 10.5–14.6)
WBC: 6.8 10*3/uL (ref 4.0–10.0)

## 2008-09-23 ENCOUNTER — Encounter: Payer: Self-pay | Admitting: Internal Medicine

## 2008-09-23 LAB — CBC WITH DIFFERENTIAL (CANCER CENTER ONLY)
BASO#: 0.1 10*3/uL (ref 0.0–0.2)
BASO%: 0.6 % (ref 0.0–2.0)
EOS%: 4 % (ref 0.0–7.0)
Eosinophils Absolute: 0.3 10*3/uL (ref 0.0–0.5)
HCT: 42.5 % (ref 38.7–49.9)
HGB: 14.1 g/dL (ref 13.0–17.1)
LYMPH#: 1.5 10*3/uL (ref 0.9–3.3)
LYMPH%: 18.5 % (ref 14.0–48.0)
MCH: 24.7 pg — ABNORMAL LOW (ref 28.0–33.4)
MCHC: 33.2 g/dL (ref 32.0–35.9)
MCV: 74 fL — ABNORMAL LOW (ref 82–98)
MONO#: 0.6 10*3/uL (ref 0.1–0.9)
MONO%: 7.7 % (ref 0.0–13.0)
NEUT#: 5.6 10*3/uL (ref 1.5–6.5)
NEUT%: 69.2 % (ref 40.0–80.0)
Platelets: 396 10*3/uL (ref 145–400)
RBC: 5.73 10*6/uL — ABNORMAL HIGH (ref 4.20–5.70)
RDW: 15.8 % — ABNORMAL HIGH (ref 10.5–14.6)
WBC: 8.1 10*3/uL (ref 4.0–10.0)

## 2008-10-14 ENCOUNTER — Ambulatory Visit (HOSPITAL_BASED_OUTPATIENT_CLINIC_OR_DEPARTMENT_OTHER): Admission: RE | Admit: 2008-10-14 | Discharge: 2008-10-14 | Payer: Self-pay | Admitting: Hematology & Oncology

## 2008-10-14 ENCOUNTER — Ambulatory Visit: Payer: Self-pay | Admitting: Diagnostic Radiology

## 2008-11-10 ENCOUNTER — Ambulatory Visit: Payer: Self-pay | Admitting: Hematology & Oncology

## 2008-11-11 ENCOUNTER — Encounter: Payer: Self-pay | Admitting: Internal Medicine

## 2008-11-11 LAB — CBC WITH DIFFERENTIAL (CANCER CENTER ONLY)
BASO#: 0.1 10*3/uL (ref 0.0–0.2)
BASO%: 1.1 % (ref 0.0–2.0)
EOS%: 5.6 % (ref 0.0–7.0)
Eosinophils Absolute: 0.5 10*3/uL (ref 0.0–0.5)
HCT: 44.8 % (ref 38.7–49.9)
HGB: 14.2 g/dL (ref 13.0–17.1)
LYMPH#: 1.6 10*3/uL (ref 0.9–3.3)
LYMPH%: 17.9 % (ref 14.0–48.0)
MCH: 23.2 pg — ABNORMAL LOW (ref 28.0–33.4)
MCHC: 31.7 g/dL — ABNORMAL LOW (ref 32.0–35.9)
MCV: 73 fL — ABNORMAL LOW (ref 82–98)
MONO#: 0.5 10*3/uL (ref 0.1–0.9)
MONO%: 6 % (ref 0.0–13.0)
NEUT#: 6.2 10*3/uL (ref 1.5–6.5)
NEUT%: 69.4 % (ref 40.0–80.0)
Platelets: 441 10*3/uL — ABNORMAL HIGH (ref 145–400)
RBC: 6.13 10*6/uL — ABNORMAL HIGH (ref 4.20–5.70)
RDW: 15.8 % — ABNORMAL HIGH (ref 10.5–14.6)
WBC: 9 10*3/uL (ref 4.0–10.0)

## 2008-11-11 LAB — CHCC SATELLITE - SMEAR

## 2008-11-19 ENCOUNTER — Encounter: Payer: Self-pay | Admitting: Cardiology

## 2009-01-26 ENCOUNTER — Ambulatory Visit: Payer: Self-pay | Admitting: Hematology & Oncology

## 2009-01-27 ENCOUNTER — Encounter: Payer: Self-pay | Admitting: Internal Medicine

## 2009-01-27 LAB — CBC WITH DIFFERENTIAL (CANCER CENTER ONLY)
BASO#: 0.1 10*3/uL (ref 0.0–0.2)
BASO%: 0.9 % (ref 0.0–2.0)
EOS%: 5.4 % (ref 0.0–7.0)
Eosinophils Absolute: 0.7 10*3/uL — ABNORMAL HIGH (ref 0.0–0.5)
HCT: 44.4 % (ref 38.7–49.9)
HGB: 14.4 g/dL (ref 13.0–17.1)
LYMPH#: 1.7 10*3/uL (ref 0.9–3.3)
LYMPH%: 13.9 % — ABNORMAL LOW (ref 14.0–48.0)
MCH: 22.3 pg — ABNORMAL LOW (ref 28.0–33.4)
MCHC: 32.5 g/dL (ref 32.0–35.9)
MCV: 68 fL — ABNORMAL LOW (ref 82–98)
MONO#: 0.8 10*3/uL (ref 0.1–0.9)
MONO%: 6.1 % (ref 0.0–13.0)
NEUT#: 9 10*3/uL — ABNORMAL HIGH (ref 1.5–6.5)
NEUT%: 73.7 % (ref 40.0–80.0)
Platelets: 715 10*3/uL — ABNORMAL HIGH (ref 145–400)
RBC: 6.49 10*6/uL — ABNORMAL HIGH (ref 4.20–5.70)
RDW: 15.4 % — ABNORMAL HIGH (ref 10.5–14.6)
WBC: 12.2 10*3/uL — ABNORMAL HIGH (ref 4.0–10.0)

## 2009-01-27 LAB — FERRITIN: Ferritin: 7 ng/mL — ABNORMAL LOW (ref 22–322)

## 2009-02-24 ENCOUNTER — Encounter: Payer: Self-pay | Admitting: Cardiology

## 2009-02-24 LAB — CBC WITH DIFFERENTIAL (CANCER CENTER ONLY)
BASO#: 0.1 10*3/uL (ref 0.0–0.2)
BASO%: 0.8 % (ref 0.0–2.0)
EOS%: 4.5 % (ref 0.0–7.0)
Eosinophils Absolute: 0.3 10*3/uL (ref 0.0–0.5)
HCT: 43.3 % (ref 38.7–49.9)
HGB: 14.2 g/dL (ref 13.0–17.1)
LYMPH#: 1.4 10*3/uL (ref 0.9–3.3)
LYMPH%: 18.8 % (ref 14.0–48.0)
MCH: 22.4 pg — ABNORMAL LOW (ref 28.0–33.4)
MCHC: 32.7 g/dL (ref 32.0–35.9)
MCV: 68 fL — ABNORMAL LOW (ref 82–98)
MONO#: 0.4 10*3/uL (ref 0.1–0.9)
MONO%: 5.4 % (ref 0.0–13.0)
NEUT#: 5.3 10*3/uL (ref 1.5–6.5)
NEUT%: 70.5 % (ref 40.0–80.0)
Platelets: 697 10*3/uL — ABNORMAL HIGH (ref 145–400)
RBC: 6.32 10*6/uL — ABNORMAL HIGH (ref 4.20–5.70)
RDW: 17.7 % — ABNORMAL HIGH (ref 10.5–14.6)
WBC: 7.5 10*3/uL (ref 4.0–10.0)

## 2009-04-12 ENCOUNTER — Ambulatory Visit: Payer: Self-pay | Admitting: Hematology & Oncology

## 2009-04-14 LAB — COMPREHENSIVE METABOLIC PANEL
ALT: 68 U/L — ABNORMAL HIGH (ref 0–53)
AST: 46 U/L — ABNORMAL HIGH (ref 0–37)
Albumin: 4.8 g/dL (ref 3.5–5.2)
Alkaline Phosphatase: 66 U/L (ref 39–117)
BUN: 16 mg/dL (ref 6–23)
CO2: 22 mEq/L (ref 19–32)
Calcium: 9 mg/dL (ref 8.4–10.5)
Chloride: 104 mEq/L (ref 96–112)
Creatinine, Ser: 1.3 mg/dL (ref 0.40–1.50)
Glucose, Bld: 97 mg/dL (ref 70–99)
Potassium: 3.7 mEq/L (ref 3.5–5.3)
Sodium: 138 mEq/L (ref 135–145)
Total Bilirubin: 0.8 mg/dL (ref 0.3–1.2)
Total Protein: 6.7 g/dL (ref 6.0–8.3)

## 2009-04-14 LAB — CHCC SATELLITE - SMEAR

## 2009-04-14 LAB — CBC WITH DIFFERENTIAL (CANCER CENTER ONLY)
BASO#: 0.1 10*3/uL (ref 0.0–0.2)
BASO%: 0.8 % (ref 0.0–2.0)
EOS%: 4.4 % (ref 0.0–7.0)
Eosinophils Absolute: 0.4 10*3/uL (ref 0.0–0.5)
HCT: 44.8 % (ref 38.7–49.9)
HGB: 14.8 g/dL (ref 13.0–17.1)
LYMPH#: 1.6 10*3/uL (ref 0.9–3.3)
LYMPH%: 16.2 % (ref 14.0–48.0)
MCH: 22.5 pg — ABNORMAL LOW (ref 28.0–33.4)
MCHC: 33 g/dL (ref 32.0–35.9)
MCV: 68 fL — ABNORMAL LOW (ref 82–98)
MONO#: 0.7 10*3/uL (ref 0.1–0.9)
MONO%: 7 % (ref 0.0–13.0)
NEUT#: 7 10*3/uL — ABNORMAL HIGH (ref 1.5–6.5)
NEUT%: 71.6 % (ref 40.0–80.0)
Platelets: 732 10*3/uL — ABNORMAL HIGH (ref 145–400)
RBC: 6.57 10*6/uL — ABNORMAL HIGH (ref 4.20–5.70)
RDW: 18.6 % — ABNORMAL HIGH (ref 10.5–14.6)
WBC: 9.8 10*3/uL (ref 4.0–10.0)

## 2009-04-14 LAB — FERRITIN: Ferritin: 8 ng/mL — ABNORMAL LOW (ref 22–322)

## 2009-05-24 ENCOUNTER — Ambulatory Visit: Payer: Self-pay | Admitting: Hematology & Oncology

## 2009-05-26 LAB — CBC WITH DIFFERENTIAL (CANCER CENTER ONLY)
BASO#: 0.1 10*3/uL (ref 0.0–0.2)
BASO%: 0.7 % (ref 0.0–2.0)
EOS%: 3.8 % (ref 0.0–7.0)
Eosinophils Absolute: 0.4 10*3/uL (ref 0.0–0.5)
HCT: 45.8 % (ref 38.7–49.9)
HGB: 15 g/dL (ref 13.0–17.1)
LYMPH#: 1.6 10*3/uL (ref 0.9–3.3)
LYMPH%: 14.8 % (ref 14.0–48.0)
MCH: 22.9 pg — ABNORMAL LOW (ref 28.0–33.4)
MCHC: 32.6 g/dL (ref 32.0–35.9)
MCV: 70 fL — ABNORMAL LOW (ref 82–98)
MONO#: 0.6 10*3/uL (ref 0.1–0.9)
MONO%: 5.1 % (ref 0.0–13.0)
NEUT#: 8.1 10*3/uL — ABNORMAL HIGH (ref 1.5–6.5)
NEUT%: 75.6 % (ref 40.0–80.0)
Platelets: 500 10*3/uL — ABNORMAL HIGH (ref 145–400)
RBC: 6.53 10*6/uL — ABNORMAL HIGH (ref 4.20–5.70)
RDW: 16.6 % — ABNORMAL HIGH (ref 10.5–14.6)
WBC: 10.8 10*3/uL — ABNORMAL HIGH (ref 4.0–10.0)

## 2009-05-26 LAB — FERRITIN: Ferritin: 8 ng/mL — ABNORMAL LOW (ref 22–322)

## 2009-05-26 LAB — CHCC SATELLITE - SMEAR

## 2009-05-26 LAB — VITAMIN D 25 HYDROXY (VIT D DEFICIENCY, FRACTURES): Vit D, 25-Hydroxy: 31 ng/mL (ref 30–89)

## 2009-06-29 ENCOUNTER — Ambulatory Visit: Payer: Self-pay | Admitting: Hematology & Oncology

## 2009-06-30 ENCOUNTER — Encounter: Payer: Self-pay | Admitting: Cardiology

## 2009-06-30 ENCOUNTER — Ambulatory Visit (HOSPITAL_BASED_OUTPATIENT_CLINIC_OR_DEPARTMENT_OTHER): Admission: RE | Admit: 2009-06-30 | Discharge: 2009-06-30 | Payer: Self-pay | Admitting: Hematology & Oncology

## 2009-06-30 ENCOUNTER — Ambulatory Visit: Payer: Self-pay | Admitting: Diagnostic Radiology

## 2009-06-30 LAB — CBC WITH DIFFERENTIAL (CANCER CENTER ONLY)
BASO#: 0.1 10*3/uL (ref 0.0–0.2)
BASO%: 0.8 % (ref 0.0–2.0)
EOS%: 4.8 % (ref 0.0–7.0)
Eosinophils Absolute: 0.4 10*3/uL (ref 0.0–0.5)
HCT: 40.5 % (ref 38.7–49.9)
HGB: 13 g/dL (ref 13.0–17.1)
LYMPH#: 1.4 10*3/uL (ref 0.9–3.3)
LYMPH%: 16.9 % (ref 14.0–48.0)
MCH: 22.5 pg — ABNORMAL LOW (ref 28.0–33.4)
MCHC: 32.1 g/dL (ref 32.0–35.9)
MCV: 70 fL — ABNORMAL LOW (ref 82–98)
MONO#: 0.6 10*3/uL (ref 0.1–0.9)
MONO%: 6.9 % (ref 0.0–13.0)
NEUT#: 5.8 10*3/uL (ref 1.5–6.5)
NEUT%: 70.6 % (ref 40.0–80.0)
Platelets: 659 10*3/uL — ABNORMAL HIGH (ref 145–400)
RBC: 5.77 10*6/uL — ABNORMAL HIGH (ref 4.20–5.70)
RDW: 16.7 % — ABNORMAL HIGH (ref 10.5–14.6)
WBC: 8.3 10*3/uL (ref 4.0–10.0)

## 2009-06-30 LAB — COMPREHENSIVE METABOLIC PANEL
ALT: 36 U/L (ref 0–53)
AST: 33 U/L (ref 0–37)
Albumin: 5 g/dL (ref 3.5–5.2)
Alkaline Phosphatase: 55 U/L (ref 39–117)
BUN: 14 mg/dL (ref 6–23)
CO2: 25 mEq/L (ref 19–32)
Calcium: 9.4 mg/dL (ref 8.4–10.5)
Chloride: 103 mEq/L (ref 96–112)
Creatinine, Ser: 0.88 mg/dL (ref 0.40–1.50)
Glucose, Bld: 93 mg/dL (ref 70–99)
Potassium: 4 mEq/L (ref 3.5–5.3)
Sodium: 138 mEq/L (ref 135–145)
Total Bilirubin: 0.7 mg/dL (ref 0.3–1.2)
Total Protein: 6.9 g/dL (ref 6.0–8.3)

## 2009-06-30 LAB — VITAMIN D 25 HYDROXY (VIT D DEFICIENCY, FRACTURES): Vit D, 25-Hydroxy: 30 ng/mL (ref 30–89)

## 2009-08-09 ENCOUNTER — Ambulatory Visit: Payer: Self-pay | Admitting: Hematology & Oncology

## 2009-08-11 ENCOUNTER — Encounter: Payer: Self-pay | Admitting: Cardiology

## 2009-08-11 LAB — CBC WITH DIFFERENTIAL (CANCER CENTER ONLY)
BASO#: 0.2 10*3/uL (ref 0.0–0.2)
BASO%: 2.1 % — ABNORMAL HIGH (ref 0.0–2.0)
EOS%: 2.9 % (ref 0.0–7.0)
Eosinophils Absolute: 0.2 10*3/uL (ref 0.0–0.5)
HCT: 42 % (ref 38.7–49.9)
HGB: 13.2 g/dL (ref 13.0–17.1)
LYMPH#: 1.1 10*3/uL (ref 0.9–3.3)
LYMPH%: 14.3 % (ref 14.0–48.0)
MCH: 23.7 pg — ABNORMAL LOW (ref 28.0–33.4)
MCHC: 31.5 g/dL — ABNORMAL LOW (ref 32.0–35.9)
MCV: 75 fL — ABNORMAL LOW (ref 82–98)
MONO#: 0.5 10*3/uL (ref 0.1–0.9)
MONO%: 6.8 % (ref 0.0–13.0)
NEUT#: 5.4 10*3/uL (ref 1.5–6.5)
NEUT%: 73.9 % (ref 40.0–80.0)
Platelets: 490 10*3/uL — ABNORMAL HIGH (ref 145–400)
RBC: 5.58 10*6/uL (ref 4.20–5.70)
RDW: 21.3 % — ABNORMAL HIGH (ref 10.5–14.6)
WBC: 7.4 10*3/uL (ref 4.0–10.0)

## 2009-08-11 LAB — FERRITIN: Ferritin: 6 ng/mL — ABNORMAL LOW (ref 22–322)

## 2009-08-11 LAB — CHCC SATELLITE - SMEAR

## 2009-09-09 DIAGNOSIS — R079 Chest pain, unspecified: Secondary | ICD-10-CM | POA: Insufficient documentation

## 2009-09-14 ENCOUNTER — Ambulatory Visit: Payer: Self-pay | Admitting: Cardiovascular Disease

## 2009-09-14 ENCOUNTER — Encounter: Payer: Self-pay | Admitting: Cardiology

## 2009-09-14 DIAGNOSIS — R002 Palpitations: Secondary | ICD-10-CM | POA: Insufficient documentation

## 2009-09-15 ENCOUNTER — Ambulatory Visit: Payer: Self-pay | Admitting: Hematology & Oncology

## 2009-09-16 ENCOUNTER — Encounter: Payer: Self-pay | Admitting: Cardiology

## 2009-09-16 LAB — COMPREHENSIVE METABOLIC PANEL
ALT: 41 U/L (ref 0–53)
AST: 30 U/L (ref 0–37)
Albumin: 4.7 g/dL (ref 3.5–5.2)
Alkaline Phosphatase: 66 U/L (ref 39–117)
BUN: 14 mg/dL (ref 6–23)
CO2: 29 mEq/L (ref 19–32)
Calcium: 8.8 mg/dL (ref 8.4–10.5)
Chloride: 101 mEq/L (ref 96–112)
Creatinine, Ser: 0.9 mg/dL (ref 0.40–1.50)
Glucose, Bld: 76 mg/dL (ref 70–99)
Potassium: 4 mEq/L (ref 3.5–5.3)
Sodium: 138 mEq/L (ref 135–145)
Total Bilirubin: 0.7 mg/dL (ref 0.3–1.2)
Total Protein: 6.5 g/dL (ref 6.0–8.3)

## 2009-09-16 LAB — CBC WITH DIFFERENTIAL (CANCER CENTER ONLY)
BASO#: 0 10*3/uL (ref 0.0–0.2)
BASO%: 0.6 % (ref 0.0–2.0)
EOS%: 3.5 % (ref 0.0–7.0)
Eosinophils Absolute: 0.2 10*3/uL (ref 0.0–0.5)
HCT: 40.7 % (ref 38.7–49.9)
HGB: 13.5 g/dL (ref 13.0–17.1)
LYMPH#: 1.1 10*3/uL (ref 0.9–3.3)
LYMPH%: 19.3 % (ref 14.0–48.0)
MCH: 25.5 pg — ABNORMAL LOW (ref 28.0–33.4)
MCHC: 33.1 g/dL (ref 32.0–35.9)
MCV: 77 fL — ABNORMAL LOW (ref 82–98)
MONO#: 0.6 10*3/uL (ref 0.1–0.9)
MONO%: 10.5 % (ref 0.0–13.0)
NEUT#: 3.7 10*3/uL (ref 1.5–6.5)
NEUT%: 66.1 % (ref 40.0–80.0)
Platelets: 406 10*3/uL — ABNORMAL HIGH (ref 145–400)
RBC: 5.28 10*6/uL (ref 4.20–5.70)
RDW: 21 % — ABNORMAL HIGH (ref 10.5–14.6)
WBC: 5.6 10*3/uL (ref 4.0–10.0)

## 2009-09-16 LAB — CHCC SATELLITE - SMEAR

## 2009-09-16 LAB — FERRITIN: Ferritin: 4 ng/mL — ABNORMAL LOW (ref 22–322)

## 2009-09-16 LAB — VITAMIN D 25 HYDROXY (VIT D DEFICIENCY, FRACTURES): Vit D, 25-Hydroxy: 48 ng/mL (ref 30–89)

## 2009-09-26 ENCOUNTER — Ambulatory Visit: Payer: Self-pay | Admitting: Cardiology

## 2009-10-13 ENCOUNTER — Ambulatory Visit: Payer: Self-pay

## 2009-10-13 ENCOUNTER — Ambulatory Visit (HOSPITAL_COMMUNITY): Admission: RE | Admit: 2009-10-13 | Discharge: 2009-10-13 | Payer: Self-pay | Admitting: Cardiology

## 2009-10-13 ENCOUNTER — Ambulatory Visit: Payer: Self-pay | Admitting: Cardiovascular Disease

## 2009-10-13 ENCOUNTER — Ambulatory Visit: Payer: Self-pay | Admitting: Cardiology

## 2009-10-26 ENCOUNTER — Ambulatory Visit: Payer: Self-pay | Admitting: Hematology & Oncology

## 2009-10-27 ENCOUNTER — Encounter: Payer: Self-pay | Admitting: Cardiology

## 2009-10-27 LAB — CBC WITH DIFFERENTIAL (CANCER CENTER ONLY)
BASO#: 0.1 10*3/uL (ref 0.0–0.2)
BASO%: 0.8 % (ref 0.0–2.0)
EOS%: 4.4 % (ref 0.0–7.0)
Eosinophils Absolute: 0.3 10*3/uL (ref 0.0–0.5)
HCT: 41.5 % (ref 38.7–49.9)
HGB: 14 g/dL (ref 13.0–17.1)
LYMPH#: 1.3 10*3/uL (ref 0.9–3.3)
LYMPH%: 21.2 % (ref 14.0–48.0)
MCH: 28 pg (ref 28.0–33.4)
MCHC: 33.7 g/dL (ref 32.0–35.9)
MCV: 83 fL (ref 82–98)
MONO#: 0.5 10*3/uL (ref 0.1–0.9)
MONO%: 8 % (ref 0.0–13.0)
NEUT#: 4.1 10*3/uL (ref 1.5–6.5)
NEUT%: 65.6 % (ref 40.0–80.0)
Platelets: 541 10*3/uL — ABNORMAL HIGH (ref 145–400)
RBC: 4.99 10*6/uL (ref 4.20–5.70)
RDW: 18.9 % — ABNORMAL HIGH (ref 10.5–14.6)
WBC: 6.2 10*3/uL (ref 4.0–10.0)

## 2009-10-27 LAB — COMPREHENSIVE METABOLIC PANEL
ALT: 33 U/L (ref 0–53)
AST: 32 U/L (ref 0–37)
Albumin: 4.7 g/dL (ref 3.5–5.2)
Alkaline Phosphatase: 57 U/L (ref 39–117)
BUN: 13 mg/dL (ref 6–23)
CO2: 23 mEq/L (ref 19–32)
Calcium: 9.2 mg/dL (ref 8.4–10.5)
Chloride: 104 mEq/L (ref 96–112)
Creatinine, Ser: 0.97 mg/dL (ref 0.40–1.50)
Glucose, Bld: 90 mg/dL (ref 70–99)
Potassium: 4.1 mEq/L (ref 3.5–5.3)
Sodium: 140 mEq/L (ref 135–145)
Total Bilirubin: 0.6 mg/dL (ref 0.3–1.2)
Total Protein: 6.6 g/dL (ref 6.0–8.3)

## 2009-10-27 LAB — RETICULOCYTES (CHCC)
ABS Retic: 77.1 10*3/uL (ref 19.0–186.0)
RBC.: 5.14 MIL/uL (ref 4.22–5.81)
Retic Ct Pct: 1.5 % (ref 0.4–3.1)

## 2009-10-27 LAB — FERRITIN: Ferritin: 8 ng/mL — ABNORMAL LOW (ref 22–322)

## 2009-10-27 LAB — CHCC SATELLITE - SMEAR

## 2009-11-03 ENCOUNTER — Telehealth (INDEPENDENT_AMBULATORY_CARE_PROVIDER_SITE_OTHER): Payer: Self-pay | Admitting: *Deleted

## 2009-11-16 ENCOUNTER — Encounter: Payer: Self-pay | Admitting: Cardiology

## 2009-11-16 ENCOUNTER — Ambulatory Visit: Payer: Self-pay | Admitting: Cardiology

## 2009-12-21 ENCOUNTER — Ambulatory Visit: Payer: Self-pay | Admitting: Hematology & Oncology

## 2009-12-22 LAB — COMPREHENSIVE METABOLIC PANEL
ALT: 62 U/L — ABNORMAL HIGH (ref 0–53)
AST: 33 U/L (ref 0–37)
Albumin: 4.7 g/dL (ref 3.5–5.2)
Alkaline Phosphatase: 65 U/L (ref 39–117)
BUN: 15 mg/dL (ref 6–23)
CO2: 23 mEq/L (ref 19–32)
Calcium: 9.3 mg/dL (ref 8.4–10.5)
Chloride: 105 mEq/L (ref 96–112)
Creatinine, Ser: 0.94 mg/dL (ref 0.40–1.50)
Glucose, Bld: 98 mg/dL (ref 70–99)
Potassium: 4.4 mEq/L (ref 3.5–5.3)
Sodium: 140 mEq/L (ref 135–145)
Total Bilirubin: 0.7 mg/dL (ref 0.3–1.2)
Total Protein: 6.2 g/dL (ref 6.0–8.3)

## 2009-12-22 LAB — CBC WITH DIFFERENTIAL (CANCER CENTER ONLY)
BASO#: 0.1 10*3/uL (ref 0.0–0.2)
BASO%: 0.6 % (ref 0.0–2.0)
EOS%: 4.4 % (ref 0.0–7.0)
Eosinophils Absolute: 0.4 10*3/uL (ref 0.0–0.5)
HCT: 45.5 % (ref 38.7–49.9)
HGB: 15 g/dL (ref 13.0–17.1)
LYMPH#: 1.6 10*3/uL (ref 0.9–3.3)
LYMPH%: 17.5 % (ref 14.0–48.0)
MCH: 26.3 pg — ABNORMAL LOW (ref 28.0–33.4)
MCHC: 32.9 g/dL (ref 32.0–35.9)
MCV: 80 fL — ABNORMAL LOW (ref 82–98)
MONO#: 0.5 10*3/uL (ref 0.1–0.9)
MONO%: 5.6 % (ref 0.0–13.0)
NEUT#: 6.7 10*3/uL — ABNORMAL HIGH (ref 1.5–6.5)
NEUT%: 71.9 % (ref 40.0–80.0)
Platelets: 778 10*3/uL — ABNORMAL HIGH (ref 145–400)
RBC: 5.7 10*6/uL (ref 4.20–5.70)
RDW: 14.3 % (ref 10.5–14.6)
WBC: 9.3 10*3/uL (ref 4.0–10.0)

## 2009-12-22 LAB — FERRITIN: Ferritin: 7 ng/mL — ABNORMAL LOW (ref 22–322)

## 2009-12-22 LAB — LACTATE DEHYDROGENASE: LDH: 276 U/L — ABNORMAL HIGH (ref 94–250)

## 2009-12-22 LAB — CHCC SATELLITE - SMEAR

## 2010-01-06 LAB — MORPHOLOGY - CHCC SATELLITE: PLT EST ~~LOC~~: ADEQUATE

## 2010-01-06 LAB — CBC WITH DIFFERENTIAL (CANCER CENTER ONLY)
BASO#: 0.1 10*3/uL (ref 0.0–0.2)
BASO%: 0.9 % (ref 0.0–2.0)
EOS%: 4.9 % (ref 0.0–7.0)
Eosinophils Absolute: 0.3 10*3/uL (ref 0.0–0.5)
HCT: 43.5 % (ref 38.7–49.9)
HGB: 14.3 g/dL (ref 13.0–17.1)
LYMPH#: 1.3 10*3/uL (ref 0.9–3.3)
LYMPH%: 20.4 % (ref 14.0–48.0)
MCH: 25.6 pg — ABNORMAL LOW (ref 28.0–33.4)
MCHC: 32.8 g/dL (ref 32.0–35.9)
MCV: 78 fL — ABNORMAL LOW (ref 82–98)
MONO#: 0.6 10*3/uL (ref 0.1–0.9)
MONO%: 8.9 % (ref 0.0–13.0)
NEUT#: 4.3 10*3/uL (ref 1.5–6.5)
NEUT%: 64.9 % (ref 40.0–80.0)
Platelets: 321 10*3/uL (ref 145–400)
RBC: 5.56 10*6/uL (ref 4.20–5.70)
RDW: 14.4 % (ref 10.5–14.6)
WBC: 6.6 10*3/uL (ref 4.0–10.0)

## 2010-01-24 ENCOUNTER — Ambulatory Visit: Payer: Self-pay | Admitting: Hematology & Oncology

## 2010-01-25 LAB — CBC WITH DIFFERENTIAL (CANCER CENTER ONLY)
BASO#: 0 10*3/uL (ref 0.0–0.2)
BASO%: 0.7 % (ref 0.0–2.0)
EOS%: 3.8 % (ref 0.0–7.0)
Eosinophils Absolute: 0.2 10*3/uL (ref 0.0–0.5)
HCT: 43.4 % (ref 38.7–49.9)
HGB: 14.2 g/dL (ref 13.0–17.1)
LYMPH#: 1.5 10*3/uL (ref 0.9–3.3)
LYMPH%: 24.9 % (ref 14.0–48.0)
MCH: 25 pg — ABNORMAL LOW (ref 28.0–33.4)
MCHC: 32.6 g/dL (ref 32.0–35.9)
MCV: 76 fL — ABNORMAL LOW (ref 82–98)
MONO#: 0.5 10*3/uL (ref 0.1–0.9)
MONO%: 8.7 % (ref 0.0–13.0)
NEUT#: 3.8 10*3/uL (ref 1.5–6.5)
NEUT%: 61.9 % (ref 40.0–80.0)
Platelets: 242 10*3/uL (ref 145–400)
RBC: 5.67 10*6/uL (ref 4.20–5.70)
RDW: 15.3 % — ABNORMAL HIGH (ref 10.5–14.6)
WBC: 6.1 10*3/uL (ref 4.0–10.0)

## 2010-01-25 LAB — LACTATE DEHYDROGENASE: LDH: 196 U/L (ref 94–250)

## 2010-01-25 LAB — COMPREHENSIVE METABOLIC PANEL
ALT: 54 U/L — ABNORMAL HIGH (ref 0–53)
AST: 41 U/L — ABNORMAL HIGH (ref 0–37)
Albumin: 4.8 g/dL (ref 3.5–5.2)
Alkaline Phosphatase: 58 U/L (ref 39–117)
BUN: 13 mg/dL (ref 6–23)
CO2: 29 mEq/L (ref 19–32)
Calcium: 9.4 mg/dL (ref 8.4–10.5)
Chloride: 102 mEq/L (ref 96–112)
Creatinine, Ser: 0.89 mg/dL (ref 0.40–1.50)
Glucose, Bld: 102 mg/dL — ABNORMAL HIGH (ref 70–99)
Potassium: 3.8 mEq/L (ref 3.5–5.3)
Sodium: 139 mEq/L (ref 135–145)
Total Bilirubin: 0.8 mg/dL (ref 0.3–1.2)
Total Protein: 6.8 g/dL (ref 6.0–8.3)

## 2010-01-25 LAB — RETICULOCYTES (CHCC)
ABS Retic: 56.6 10*3/uL (ref 19.0–186.0)
RBC.: 5.66 MIL/uL (ref 4.22–5.81)
Retic Ct Pct: 1 % (ref 0.4–3.1)

## 2010-01-25 LAB — FERRITIN: Ferritin: 7 ng/mL — ABNORMAL LOW (ref 22–322)

## 2010-01-25 LAB — CHCC SATELLITE - SMEAR

## 2010-03-08 ENCOUNTER — Ambulatory Visit: Payer: Self-pay | Admitting: Hematology & Oncology

## 2010-03-09 ENCOUNTER — Encounter: Payer: Self-pay | Admitting: Cardiology

## 2010-03-09 LAB — TECHNOLOGIST REVIEW CHCC SATELLITE

## 2010-03-09 LAB — CBC WITH DIFFERENTIAL (CANCER CENTER ONLY)
BASO#: 0 10*3/uL (ref 0.0–0.2)
BASO%: 0.7 % (ref 0.0–2.0)
EOS%: 4.3 % (ref 0.0–7.0)
Eosinophils Absolute: 0.3 10*3/uL (ref 0.0–0.5)
HCT: 46.9 % (ref 38.7–49.9)
HGB: 15.4 g/dL (ref 13.0–17.1)
LYMPH#: 1.1 10*3/uL (ref 0.9–3.3)
LYMPH%: 18 % (ref 14.0–48.0)
MCH: 25.9 pg — ABNORMAL LOW (ref 28.0–33.4)
MCHC: 32.8 g/dL (ref 32.0–35.9)
MCV: 79 fL — ABNORMAL LOW (ref 82–98)
MONO#: 0.7 10*3/uL (ref 0.1–0.9)
MONO%: 10.8 % (ref 0.0–13.0)
NEUT#: 4.1 10*3/uL (ref 1.5–6.5)
NEUT%: 66.2 % (ref 40.0–80.0)
Platelets: 485 10*3/uL — ABNORMAL HIGH (ref 145–400)
RBC: 5.93 10*6/uL — ABNORMAL HIGH (ref 4.20–5.70)
RDW: 20.2 % — ABNORMAL HIGH (ref 10.5–14.6)
WBC: 6.3 10*3/uL (ref 4.0–10.0)

## 2010-03-09 LAB — CHCC SATELLITE - SMEAR

## 2010-03-09 LAB — FERRITIN: Ferritin: 7 ng/mL — ABNORMAL LOW (ref 22–322)

## 2010-05-17 ENCOUNTER — Encounter: Payer: Self-pay | Admitting: Cardiology

## 2010-05-17 ENCOUNTER — Ambulatory Visit
Admission: RE | Admit: 2010-05-17 | Discharge: 2010-05-17 | Payer: Self-pay | Source: Home / Self Care | Attending: Cardiology | Admitting: Cardiology

## 2010-05-17 ENCOUNTER — Ambulatory Visit: Payer: Self-pay | Admitting: Hematology & Oncology

## 2010-05-18 ENCOUNTER — Encounter: Payer: Self-pay | Admitting: Cardiology

## 2010-05-18 LAB — LACTATE DEHYDROGENASE: LDH: 204 U/L (ref 94–250)

## 2010-05-18 LAB — CHCC SATELLITE - SMEAR

## 2010-05-18 LAB — CBC WITH DIFFERENTIAL (CANCER CENTER ONLY)
BASO#: 0 10*3/uL (ref 0.0–0.2)
BASO%: 0.6 % (ref 0.0–2.0)
EOS%: 3 % (ref 0.0–7.0)
Eosinophils Absolute: 0.2 10*3/uL (ref 0.0–0.5)
HCT: 43.1 % (ref 38.7–49.9)
HGB: 14.4 g/dL (ref 13.0–17.1)
LYMPH#: 1 10*3/uL (ref 0.9–3.3)
LYMPH%: 15.8 % (ref 14.0–48.0)
MCH: 27.5 pg — ABNORMAL LOW (ref 28.0–33.4)
MCHC: 33.3 g/dL (ref 32.0–35.9)
MCV: 82 fL (ref 82–98)
MONO#: 0.5 10*3/uL (ref 0.1–0.9)
MONO%: 7.8 % (ref 0.0–13.0)
NEUT#: 4.6 10*3/uL (ref 1.5–6.5)
NEUT%: 72.8 % (ref 40.0–80.0)
Platelets: 470 10*3/uL — ABNORMAL HIGH (ref 145–400)
RBC: 5.23 10*6/uL (ref 4.20–5.70)
RDW: 14.4 % (ref 10.5–14.6)
WBC: 6.3 10*3/uL (ref 4.0–10.0)

## 2010-05-18 LAB — FERRITIN: Ferritin: 6 ng/mL — ABNORMAL LOW (ref 22–322)

## 2010-05-28 ENCOUNTER — Encounter: Payer: Self-pay | Admitting: Hematology & Oncology

## 2010-05-29 ENCOUNTER — Encounter: Payer: Self-pay | Admitting: Hematology & Oncology

## 2010-06-04 LAB — CONVERTED CEMR LAB: TSH: 3.38 microintl units/mL (ref 0.35–5.50)

## 2010-06-06 NOTE — Progress Notes (Signed)
  Phone Note Outgoing Call   Call placed by: Deliah Goody, RN,  November 03, 2009 11:42 AM Summary of Call: monitor reviewed by dr [name], sinus with occ PAC and PVC's. pt aware Deliah Goody, RN  November 03, 2009 11:44 AM

## 2010-06-06 NOTE — Assessment & Plan Note (Signed)
Summary: Rectal Bleeding / Change Bowel Habits    History of Present Illness Visit Type: follow up Primary GI MD: Yancey Flemings MD Primary Provider: Jarome Matin Requesting Provider: Arlan Organ Chief Complaint: Rectal bleeding, onset 1 mo blood in stool, one time only History of Present Illness:   55 year old white male with a history of polycythemia and dyslipidemia. He is sent by Dr. Myna Hidalgo regarding change in bowel habits and rectal bleeding. The patient reports a 4 to five-month history of change in bowel habits. Specifically, he describes been more constipated and experiencing cramping in the lower abdomen with increased gas. He is also noticed increased bowel sounds with a churning or gurgling noise. Previously, he had difficulties with loose stools and cramping for which she was using Levbid and Lomotil. He has not used these and months.Last month, he had one episode of moderately severe rectal bleeding. Specifically, he describes blood admixed with the stool as well as bright blood on the tissue with wiping. The problem resolved without recurrence. She has had no weight loss. He does report that symptoms with his abdomen seem worse with stress. At times, this makes his work difficult as he feels the need to defecate urgently or he feels embarrassment from loud bowel sounds. He did undergo complete colonoscopy in September of 2007 for the purposes of screening as well as to evaluate abdominal discomfort and diarrhea. Sigmoid diverticulosis was noted. The colon was otherwise normal as was the ileum. Random biopsies of the colon were normal. He denies the interval development of colon cancer in his family.             Updated Prior Medication List: HYOSCYAMINE SULFATE CR 0.375 MG  XR12H-CAP (HYOSCYAMINE SULFATE) 1 tablet two times a day  as needed for cramps and loose stools AMBIEN CR 12.5 MG CR-TABS (ZOLPIDEM TARTRATE) 1 tablet by mouth at bedtime PROPRANOLOL HCL 10 MG TABS  (PROPRANOLOL HCL) 1 tablet by mouth as needed ASPIRIN 81 MG TBEC (ASPIRIN) 1 tablet by mouth once daily MULTIVITAMINS  TABS (MULTIPLE VITAMIN) 1 tablet by mouth once daily TYLENOL 325 MG TABS (ACETAMINOPHEN) as needed DIPHENOXYLATE-ATROPINE 2.5-0.025 MG TABS (DIPHENOXYLATE-ATROPINE) as needed  Current Allergies (reviewed today): No known allergies   Past Medical History:    Reviewed history from 06/03/2008 and no changes required:       Current Problems:        DIVERTICULOSIS OF COLON (ICD-562.10)       DYSLIPIDEMIA (ICD-272.4)       POLYCYTHEMIA (ICD-289.0)         Past Surgical History:    Reviewed history from 06/03/2008 and no changes required:       Rt. Shoulder Rotator Cuff       Vasectomy   Family History:    Reviewed history and no changes required:       Abdominal aneuryesm- Father       Kidney Stones-patient       IBS-Patient  Social History:    Reviewed history and no changes required:       Occupation: Art gallery manager, [place] [employer]       Patient has never smoked.        Alcohol Use - yes       Daily Caffeine Use       Illicit Drug Use - no       Patient gets regular exercise.-walking, excercise   Risk Factors:  Tobacco use:  never Drug use:  no Alcohol use:  yes Exercise:  yes    Vital Signs:  Patient Profile:   56 Years Old Male Height:     72 inches Weight:      197.38 pounds Pulse rate:   72 / minute Pulse rhythm:   regular BP sitting:   114 / 80  (right arm) Cuff size:   regular  Vitals Entered By: Lowry Ram CMA (June 08, 2008 8:54 AM)                  Physical Exam  General:     Well developed, well nourished, no acute distress. Head:     Normocephalic and atraumatic. Eyes:     PERRLA, no icterus. Mouth:     No deformity or lesions, dentition normal. Neck:     Supple; no masses or thyromegaly. Lungs:     Clear throughout to auscultation. Heart:     Regular rate and rhythm; no murmurs, rubs,  or  bruits. Abdomen:     Soft, nontender and nondistended. No masses, hepatosplenomegaly or hernias noted. Normal bowel sounds. Rectal:     Normal exam. Msk:     Symmetrical with no gross deformities. Normal posture. Pulses:     Normal pulses noted. Extremities:     No clubbing, cyanosis, edema or deformities noted. Neurologic:     Alert and  oriented x4;  grossly normal neurologically. Skin:     Intact without significant lesions or rashes. Psych:     Alert and cooperative. Normal mood and affect.    Impression & Recommendations:  Problem # 1:  RECTAL BLEEDING (ICD-569.3) Rectal bleeding of uncertain etiology. Complete colonoscopy in 2007 as described. He may have internal hemorrhoid. Neoplasia should be excluded.  Plan: Colonoscopy. The nature of the procedure as well as the risks, benefits, and alternatives were reviewed in detail. He understood and agreed to proceed. Movi prep.  Problem # 2:  CONSTIPATION (ICD-564.00) likely functional. Recommend GlycoLax p.r.n. Colonoscopy to exclude obstructing lesion, particularly given bleeding.   Patient Instructions: 1)  Colonoscopy [facility] 06/29/08 1:30  2)  Movi prep instructions given to patient. 3)  Movi prep Rx. sent to pharmacy. 4)  Colonoscopy  brochure given. 5)  Copy to: Dr. Arlan Organ, Dr. Jarome Matin    Prescriptions: MOVIPREP 100 GM  SOLR (PEG-KCL-NACL-NASULF-NA ASC-C) As per prep instructions.  #1 x 0   Entered by:   Milford Cage CMA   Authorized by:   Hilarie Fredrickson MD   Signed by:   Milford Cage CMA on 06/08/2008   Method used:   Electronically to        [facility] [phone]* (retail)       [address]       [place], [place]  [address]       Ph: [phone]       Fax: [phone]   RxID:   [id number]

## 2010-06-06 NOTE — Letter (Signed)
Summary: [facility]  [facility]   Imported By: Kassie Mends 09/22/2009 09:45:46  _____________________________________________________________________  External Attachment:    Type:   Image     Comment:   External Document

## 2010-06-06 NOTE — Letter (Signed)
[facility]   Imported By: Lanelle Bal 06/15/2008 15:49:30  _____________________________________________________________________  External Attachment:    Type:   Image     Comment:   External Document

## 2010-06-06 NOTE — Assessment & Plan Note (Signed)
Summary: [facility]  Medications Added HYDROXYUREA 500 MG CAPS (HYDROXYUREA) Take 1 capsule by mouth two times a day PROBIOTIC  CAPS (PROBIOTIC PRODUCT) 240mg . oncce a day      Allergies Added: NKDA  Visit Type:  Initial Consult Primary Provider:  Jarome Matin  CC:  Chest discomfort- palpitations.  History of Present Illness: 55 yo male for evaluation of chest pain. The patient has no prior cardiac history. He typically does not have dyspnea on exertion, orthopnea, PND, pedal edema, syncope or exertional chest pain. He has had occasional chest pain for over one year. It is in various locations on his chest. It is described as a sharp pain without radiation. There is no associated symptoms. It is not pleuritic, positional or exertional. It lasts anywhere from 5-30 minutes and resolve spontaneously. He has also had intermittent palpitations for years. They are becoming more frequent. They are described as heart racing usually lasting 2-5 minutes and resolving spontaneously. There is no associated chest pain, shortness of breath or syncope. Because of the above we were asked to further evaluate.  Current Medications (verified): 1)  Hyoscyamine Sulfate Cr 0.375 Mg  Xr12h-Cap (Hyoscyamine Sulfate) .Marland Kitchen.. 1 Tablet Two Times A Day  As Needed For Cramps and Loose Stools 2)  Ambien Cr 12.5 Mg Cr-Tabs (Zolpidem Tartrate) .Marland Kitchen.. 1 Tablet By Mouth At Bedtime 3)  Propranolol Hcl 10 Mg Tabs (Propranolol Hcl) .Marland Kitchen.. 1 Tablet By Mouth As Needed 4)  Aspirin 81 Mg Tbec (Aspirin) .Marland Kitchen.. 1 Tablet By Mouth Once Daily 5)  Tylenol 325 Mg Tabs (Acetaminophen) .... As Needed 6)  Diphenoxylate-Atropine 2.5-0.025 Mg Tabs (Diphenoxylate-Atropine) .... As Needed 7)  Hydroxyurea 500 Mg Caps (Hydroxyurea) .... Take 1 Capsule By Mouth Two Times A Day 8)  Probiotic  Caps (Probiotic Product) .... 240mg . Oncce A Day  Allergies (verified): No Known Drug Allergies  Past History:  Past Medical History: DYSLIPIDEMIA  (ICD-272.4) DIVERTICULOSIS OF COLON (ICD-562.10) POLYCYTHEMIA (ICD-289.0) Nephrolithiasis Irritable Bowel Syndrome  Past Surgical History: Rt. Shoulder Rotator Cuff Vasectomy Detached Retina  Family History: Reviewed history from 06/08/2008 and no changes required. Abdominal aneuryesm- Father Brother with MI  Social History: Reviewed history from 06/08/2008 and no changes required. 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  Alcohol Use - yes Daily Caffeine Use Illicit Drug Use - no Patient gets regular exercise.-walking, excercise  Review of Systems       Some arthralgiasbut no fevers or chills, productive cough, hemoptysis, dysphasia, odynophagia, melena, hematochezia, dysuria, hematuria, rash, seizure activity, orthopnea, PND, pedal edema, claudication. Remaining systems are negative.   Vital Signs:  Patient profile:   55 year old male Height:      72 inches Weight:      197 pounds BMI:     26.81 Pulse rate:   60 / minute Pulse rhythm:   regular Resp:     18 per minute BP sitting:   140 / 90  (left arm) Cuff size:   large  Vitals Entered By: Vikki Ports (Sep 14, 2009 10:14 AM)  Physical Exam  General:  Well developed/well nourished in NAD Skin warm/dry Patient not depressed No peripheral clubbing Back-normal HEENT-normal/normal eyelids Neck supple/normal carotid upstroke bilaterally; no bruits; no JVD; no thyromegaly chest - CTA/ normal expansion CV - RRR/normal S1 and S2; no murmurs, rubs or gallops;  PMI nondisplaced Abdomen -NT/ND, no HSM, no mass, + bowel sounds, no bruit 2+ femoral pulses, no bruits Ext-no edema, chords, 2+ DP Neuro-grossly nonfocal  EKG  Procedure date:  09/14/2009  Findings:      NSR with no ST changes.  Impression & Recommendations:  Problem # 1:  PALPITATIONS (ICD-785.1) Continue propranolol. Schedule CardioNet. His updated medication list for this problem includes:    Propranolol Hcl  10 Mg Tabs (Propranolol hcl) .Marland Kitchen... 1 tablet by mouth as needed    Aspirin 81 Mg Tbec (Aspirin) .Marland Kitchen... 1 tablet by mouth once daily  Orders: Event (Event)  Problem # 2:  CHEST PAIN (ICD-786.50) Symptoms somewhat atypical. Schedule stress echocardiogram. His updated medication list for this problem includes:    Propranolol Hcl 10 Mg Tabs (Propranolol hcl) .Marland Kitchen... 1 tablet by mouth as needed    Aspirin 81 Mg Tbec (Aspirin) .Marland Kitchen... 1 tablet by mouth once daily  Orders: Stress Echo (Stress Echo)  Problem # 3:  DYSLIPIDEMIA (ICD-272.4) Management per primary care.  Problem # 4:  POLYCYTHEMIA (ICD-289.0) Followed by hematology.  Patient Instructions: 1)  Your physician recommends that you schedule a follow-up appointment in: 6-8 WEEKS 2)  Your physician recommends that you return for lab work ZO:XWRU STRESS TEST 3)  Your physician has requested that you have a stress echocardiogram. For further information please visit [email or web address]  Please follow instruction sheet as given. 4)  Your physician has recommended that you wear an event monitor.  Event monitors are medical devices that record the heart's electrical activity. Doctors most often use these monitors to diagnose arrhythmias. Arrhythmias are problems with the speed or rhythm of the heartbeat. The monitor is a small, portable device. You can wear one while you do your normal daily activities. This is usually used to diagnose what is causing palpitations/syncope (passing out).

## 2010-06-06 NOTE — Letter (Signed)
Summary: Patient Notice- Polyp Results  [facility]  [address] [place], [place] [address]   Phone: [phone]  Fax: [phone]        July 04, 2008 MRN: [id number]    [facility] [address] [place], [place]  [address]    Dear Mr. Salemi,  I am pleased to inform you that the colon polyp(s) removed during your recent colonoscopy was (were) found to be benign (no cancer detected) upon pathologic examination.  I recommend you have a repeat colonoscopy examination in 5 years to look for recurrent polyps, as having colon polyps increases your risk for having recurrent polyps or even colon cancer in the future.  Should you develop new or worsening symptoms of abdominal pain, bowel habit changes or bleeding from the rectum or bowels, please schedule an evaluation with either your primary care physician or with me.  Additional information/recommendations:  _X_ No further action with gastroenterology is needed at this time. Please      follow-up with your primary care physician for your other healthcare      needs.  Please call us if you are having persistent problems or have questions about your condition that have not been fully answered at this time.  Sincerely,  Hilarie Fredrickson MD  This letter has been electronically signed by your physician.

## 2010-06-06 NOTE — Letter (Signed)
Summary: [facility]  [facility]   Imported By: Sherian Rein 03/27/2010 14:43:51  _____________________________________________________________________  External Attachment:    Type:   Image     Comment:   External Document

## 2010-06-06 NOTE — Letter (Signed)
[facility]  [facility]   Imported By: Marylou Mccoy 11/25/2009 08:53:36  _____________________________________________________________________  External Attachment:    Type:   Image     Comment:   External Document

## 2010-06-06 NOTE — Procedures (Signed)
Summary: Colonoscopy   Colonoscopy  Procedure date:  01/11/2006  Findings:      Results: Diverticulosis.       Location:  [facility].    Comments:      Further recommendations pending biopsy results.   Patient Name: Josean, Lycan MRN:  Procedure Procedures: Colonoscopy CPT: [phone].    with biopsy. CPT: [id number].  Personnel: Endoscopist: [name]. [name], MD.  Referred By: Barry Dienes [name], MD.  Exam Location: Exam performed in Outpatient Clinic. Outpatient  Patient Consent: Procedure, Alternatives, Risks and Benefits discussed, consent obtained, from patient. Consent was obtained by the RN.  Indications Symptoms: Diarrhea Abdominal pain / bloating.  Average Risk Screening Routine.  History  Current Medications: Patient is not currently taking Coumadin.  Pre-Exam Physical: Performed Jan 11, 2006. Cardio-pulmonary exam, Rectal exam, HEENT exam , Abdominal exam, Mental status exam WNL.  Comments: Pt. history reviewed/updated, physical exam performed prior to initiation of sedation?YES Exam Exam: Extent of exam reached: Terminal Ileum, extent intended: Terminal Ileum.  The cecum was identified by appendiceal orifice and IC valve. Patient position: on left side. Time to Cecum: 00:01:41. Time for Withdrawl: 00:11:24. Colon retroflexion performed. Images taken. ASA Classification: II. Tolerance: excellent.  Monitoring: Pulse and BP monitoring, Oximetry used. Supplemental O2 given.  Colon Prep Used MIRALAX for colon prep. Prep results: excellent.  Sedation Meds: Patient assessed and found to be appropriate for moderate (conscious) sedation. Fentanyl 75 mcg. given IV. Versed 8 mg. given IV.  Findings NORMAL EXAM: Cecum to Rectum. Biopsy/Normal Exam taken. Comments: RANDOM BX TAKEN TO R/O MICRO. COLITIS.  NORMAL EXAM: Ileum. Comments: NORMAL X 10CM.  - DIVERTICULOSIS: Descending Colon to Sigmoid Colon. ICD9: Diverticulosis, Colon: 562.10.    Assessment  Diagnoses: 562.10: Diverticulosis, Colon.   Comments: SUSPECT IRRITABLE BOWEL Events  Unplanned Interventions: No intervention was required.  Unplanned Events: There were no complications. Plans Comments: CONTINUE FIBER AND IMMODIUM AS NEEDED. WILL ADD LEVBID BID PRN Disposition: After procedure patient sent to recovery. After recovery patient sent home.  Scheduling/Referral: Call office for appointment, to [name]. [name], MD, [place] 6-8 WEEKS,    This report was created from the original endoscopy report, which was reviewed and signed by the above listed endoscopist.   cc:  Barry Dienes. [name], MD      Arlan Organ, MD      The Patient

## 2010-06-06 NOTE — Assessment & Plan Note (Signed)
Summary: [name] [name]   Visit Type:  6 weeks follow up Primary Provider:  Jarome Matin  CC:  Irregular heart beat. Toes numbness.  History of Present Illness: 55 yo male I saw in May of 2011 for evaluation of chest pain and palpitations. Stress echocardiogram in June of 2011 was normal. A TSH was normal. A CardioNet revealed sinus rhythm with occasional PACs and PVCs. Since I last saw him the patient denies any dyspnea on exertion, orthopnea, PND, pedal edema, syncope or chest pain. He does have occasional rare palpitations but not associated with other symptoms.    Current Medications (verified): 1)  Hyoscyamine Sulfate Cr 0.375 Mg  Xr12h-Cap (Hyoscyamine Sulfate) .Marland Kitchen.. 1 Tablet Two Times A Day  As Needed For Cramps and Loose Stools 2)  Ambien Cr 12.5 Mg Cr-Tabs (Zolpidem Tartrate) .Marland Kitchen.. 1 Tablet By Mouth At Bedtime 3)  Propranolol Hcl 10 Mg Tabs (Propranolol Hcl) .Marland Kitchen.. 1 Tablet By Mouth As Needed 4)  Aspirin 81 Mg Tbec (Aspirin) .Marland Kitchen.. 1 Tablet By Mouth Once Daily 5)  Tylenol 325 Mg Tabs (Acetaminophen) .... As Needed 6)  Diphenoxylate-Atropine 2.5-0.025 Mg Tabs (Diphenoxylate-Atropine) .... As Needed 7)  Hydroxyurea 500 Mg Caps (Hydroxyurea) .... Take 1 Capsule By Mouth Two Times A Day  Allergies (verified): No Known Drug Allergies  Past History:  Past Medical History: Reviewed history from 09/14/2009 and no changes required. DYSLIPIDEMIA (ICD-272.4) DIVERTICULOSIS OF COLON (ICD-562.10) POLYCYTHEMIA (ICD-289.0) Nephrolithiasis Irritable Bowel Syndrome  Past Surgical History: Reviewed history from 09/14/2009 and no changes required. Rt. Shoulder Rotator Cuff Vasectomy Detached Retina  Social History: Reviewed history from 06/08/2008 and no changes required. 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  Alcohol Use - yes Daily Caffeine Use Illicit Drug Use - no Patient gets regular exercise.-walking, excercise  Review of Systems   Tingling Sensations in His  Feet and Hands but no fevers or chills, productive cough, hemoptysis, dysphasia, odynophagia, melena, hematochezia, dysuria, hematuria, rash, seizure activity, orthopnea, PND, pedal edema, claudication. Remaining systems are negative.   Vital Signs:  Patient profile:   55 year old male Height:      72 inches Weight:      196.50 pounds BMI:     26.75 Pulse rate:   72 / minute Pulse rhythm:   regular Resp:     18 per minute BP sitting:   120 / 84  (left arm) Cuff size:   large  Vitals Entered By: Vikki Ports (November 16, 2009 8:51 AM)  Physical Exam  General:  Well-developed well-nourished in no acute distress.  Skin is warm and dry.  HEENT is normal.  Neck is supple. No thyromegaly.  Chest is clear to auscultation with normal expansion.  Cardiovascular exam is regular rate and rhythm.  Abdominal exam nontender or distended. No masses palpated. Extremities show no edema. neuro grossly intact    Impression & Recommendations:  Problem # 1:  PALPITATIONS (ICD-785.1) These appeared to be secondary to PACs and PVCs. We have a discussion today concerning treatment options. I explained that these are benign. We have elected to continue propranolol as needed. If they worsen we will add a running dose. His updated medication list for this problem includes:    Propranolol Hcl 10 Mg Tabs (Propranolol hcl) .Marland Kitchen... 1 tablet by mouth as needed    Aspirin 81 Mg Tbec (Aspirin) .Marland Kitchen... 1 tablet by mouth once daily  Problem # 2:  CHEST PAIN (ICD-786.50) No further symptoms. Stress echo normal. No further cardiac workup  at this time. His updated medication list for this problem includes:    Propranolol Hcl 10 Mg Tabs (Propranolol hcl) .Marland Kitchen... 1 tablet by mouth as needed    Aspirin 81 Mg Tbec (Aspirin) .Marland Kitchen... 1 tablet by mouth once daily  Problem # 3:  DYSLIPIDEMIA (ICD-272.4) Management per primary care.  Problem # 4:  POLYCYTHEMIA (ICD-289.0) Management per  hematology.  Patient Instructions: 1)  Your physician recommends that you schedule a follow-up appointment in: 6 MONTHS

## 2010-06-06 NOTE — Letter (Signed)
Summary: Follow Up/[facility]  Follow Up/[facility]   Imported By: Sherian Rein 07/20/2008 08:53:47  _____________________________________________________________________  External Attachment:    Type:   Image     Comment:   External Document

## 2010-06-06 NOTE — Letter (Signed)
Summary: [facility]  [facility]   Imported By: Sherian Rein 10/13/2008 10:06:16  _____________________________________________________________________  External Attachment:    Type:   Image     Comment:   External Document

## 2010-06-06 NOTE — Letter (Signed)
Summary: [facility] Office Progress [facility]   [facility] Office Progress Note   Imported By: Roderic Ovens 10/31/2009 12:18:24  _____________________________________________________________________  External Attachment:    Type:   Image     Comment:   External Document

## 2010-06-06 NOTE — Letter (Signed)
[facility]   [facility]   Imported By: Roderic Ovens 11/30/2009 09:40:51  _____________________________________________________________________  External Attachment:    Type:   Image     Comment:   External Document

## 2010-06-06 NOTE — Letter (Signed)
[facility]   [facility]   Imported By: Roderic Ovens 11/30/2009 09:42:54  _____________________________________________________________________  External Attachment:    Type:   Image     Comment:   External Document

## 2010-06-06 NOTE — Letter (Signed)
Summary: [facility] Note [facility]  [facility] Note / [facility]   Imported By: Lennie Odor 12/16/2008 16:51:12  _____________________________________________________________________  External Attachment:    Type:   Image     Comment:   External Document

## 2010-06-06 NOTE — Procedures (Signed)
Summary: Colonoscopy   Colonoscopy  Procedure date:  06/29/2008  Findings:      Location:  [facility].    Procedures Next Due Date:    Colonoscopy: 07/2013  COLONOSCOPY PROCEDURE REPORT  PATIENT:  [name] [name], [name] [name]  MR#:  [id number] BIRTHDATE:   November 03, 1955   GENDER:   male  ENDOSCOPIST:   [name]. Eda Keys, MD Referred by: Arlan Organ, M.D.  PROCEDURE DATE:  06/29/2008 PROCEDURE:  Colonoscopy with snare polypectomy ASA CLASS:   Class II INDICATIONS: rectal bleeding, change in bowel habits   MEDICATIONS:    Fentanyl 75 mcg IV, Versed 8 mg IV  DESCRIPTION OF PROCEDURE:   After the risks benefits and alternatives of the procedure were thoroughly explained, informed consent was obtained.  Digital rectal exam was performed and revealed no abnormalities.   The LB CF-[id number] endoscope was introduced through the anus and advanced to the cecum, which was identified by both the appendix and ileocecal valve, without limitations. TIME TO CECUM = 1:50 MIN The quality of the prep was excellent, using MoviPrep.  The instrument was then withdrawn (TIME 12:25 MIN) as the colon was fully examined. <<PROCEDUREIMAGES>>          <<OLD IMAGES>>  FINDINGS:  Mild diverticulosis was found in the sigmoid colon.  A 3mm diminutive polyp was found in the proximal transverse colon. Polyp was snared without cautery. Retrieval was successful.    Retroflexed views in the rectum revealed internal hemorrhoids.    The scope was then withdrawn from the patient and the procedure completed.  COMPLICATIONS:   None  ENDOSCOPIC IMPRESSION:  1) Mild diverticulosis in the sigmoid colon  2) Diminutive polyp in the proximal transverse colon - removed  3) Internal hemorrhoids - cause for prior bleeding  RECOMMENDATIONS:  1) Repeat colonoscopy in 5 years if polyp adenomatous; otherwise 10 years 2) Glycolax as needed for contipation  3) Trial of probiotic Align, one daily for 2-3  weeks    _______________________________ [name]. Eda Keys, MD  CC: Arlan Organ MD, Jarome Matin MD, The Patient    REPORT OF SURGICAL PATHOLOGY   Case #: [id number] Patient Name: [name] [name], [name] [name] Office Chart Number:  [id number]   MRN: [id number] Pathologist: Ivin Booty B. Colonel Bald, MD DOB/Age  [birth year] (Age: 55)    Gender: M Date Taken:  06/29/2008 Date Received: 06/30/2008   FINAL DIAGNOSIS   ***MICROSCOPIC EXAMINATION AND DIAGNOSIS***   COLON, TRANSVERSE, POLYP: - TUBULAR ADENOMA. - HIGH GRADE DYSPLASIA IS NOT IDENTIFIED.    mj Date Reported:  07/01/2008     Ferd Hibbs. Colonel Bald, MD *** Electronically Signed Out By JBK ***   July 04, 2008 MRN: [id number]    [facility] [address], [place]  [address]    Dear [name] [name],  I am pleased to inform you that the colon polyp(s) removed during your recent colonoscopy was (were) found to be benign (no cancer detected) upon pathologic examination.  I recommend you have a repeat colonoscopy examination in 5 years to look for recurrent polyps, as having colon polyps increases your risk for having recurrent polyps or even colon cancer in the future.  Should you develop new or worsening symptoms of abdominal pain, bowel habit changes or bleeding from the rectum or bowels, please schedule an evaluation with either your primary care physician or with me.  Additional information/recommendations:  _X_ No further action with gastroenterology is needed at this time. Please      follow-up with your primary  care physician for your other healthcare      needs.  Please call us if you are having persistent problems or have questions about your condition that have not been fully answered at this time.  Sincerely,  Hilarie Fredrickson MD  This letter has been electronically signed by your physician.  This report was created from the original endoscopy report, which was reviewed and signed by the above listed endoscopist.

## 2010-06-08 NOTE — Assessment & Plan Note (Signed)
Summary: [name] [name]   Visit Type:  Follow-up Primary Provider:  Jarome Matin  CC:  irregular heart beat- chest pains around chest.  History of Present Illness: 55 yo male I saw in May of 2011 for evaluation of chest pain and palpitations. Stress echocardiogram in June of 2011 was normal. A TSH was normal. A CardioNet revealed sinus rhythm with occasional PACs and PVCs.  I last saw him in July of 2011. Since then, he continues to have occasional palpitations that are described as a flutter. There is no associated syncope. There is no dyspnea on exertion, orthopnea, PND or pedal edema. He continues to have occasional pain for one to 2 seconds at various locations on his chest. These can also occur in his legs.  Current Medications (verified): 1)  Hyoscyamine Sulfate Cr 0.375 Mg  Xr12h-Cap (Hyoscyamine Sulfate) .Marland Kitchen.. 1 Tablet Two Times A Day  As Needed For Cramps and Loose Stools 2)  Ambien Cr 12.5 Mg Cr-Tabs (Zolpidem Tartrate) .Marland Kitchen.. 1 Tablet By Mouth At Bedtime. As Needed 3)  Propranolol Hcl 10 Mg Tabs (Propranolol Hcl) .Marland Kitchen.. 1 Tablet By Mouth As Needed 4)  Aspirin 81 Mg Tbec (Aspirin) .Marland Kitchen.. 1 Tablet By Mouth Once Daily 5)  Tylenol 325 Mg Tabs (Acetaminophen) .... As Needed 6)  Diphenoxylate-Atropine 2.5-0.025 Mg Tabs (Diphenoxylate-Atropine) .... As Needed 7)  Hydroxyurea 500 Mg Caps (Hydroxyurea) .... Take 1 Capsule By Mouth Two Times A Day  Allergies (verified): No Known Drug Allergies  Past History:  Past Medical History: Reviewed history from 09/14/2009 and no changes required. DYSLIPIDEMIA (ICD-272.4) DIVERTICULOSIS OF COLON (ICD-562.10) POLYCYTHEMIA (ICD-289.0) Nephrolithiasis Irritable Bowel Syndrome  Past Surgical History: Reviewed history from 09/14/2009 and no changes required. Rt. Shoulder Rotator Cuff Vasectomy Detached Retina  Social History: Reviewed history from 06/08/2008 and no changes required. Occupation: Art gallery manager, [place] [employer] Patient has  never smoked.  Alcohol Use - yes Daily Caffeine Use Illicit Drug Use - no Patient gets regular exercise.-walking, excercise  Review of Systems       C/O occasional headaches, abdominal pain but no fevers or chills, productive cough, hemoptysis, dysphasia, odynophagia, melena, hematochezia, dysuria, hematuria, rash, seizure activity, orthopnea, PND, pedal edema, claudication. Remaining systems are negative.   Vital Signs:  Patient profile:   55 year old male Height:      72 inches Weight:      197.50 pounds BMI:     26.88 Pulse rate:   70 / minute Pulse rhythm:   regular Resp:     18 per minute BP sitting:   130 / 90  (right arm) Cuff size:   large  Vitals Entered By: Vikki Ports (May 17, 2010 10:35 AM)  Physical Exam  General:  Well-developed well-nourished in no acute distress.  Skin is warm and dry.  HEENT is normal.  Neck is supple. No thyromegaly.  Chest is clear to auscultation with normal expansion.  Cardiovascular exam is regular rate and rhythm.  Abdominal exam nontender or distended. No masses palpated. Extremities show no edema. neuro grossly intact    EKG  Procedure date:  05/17/2010  Findings:      Sinus with Incomplete Right Bundle Branch Block  Impression & Recommendations:  Problem # 1:  PALPITATIONS (ICD-785.1) Previous monitor showed PACs and PVCs. They continued to be bothersome. Add Inderal LA 80 mg p.o. daily. His updated medication list for this problem includes:    Inderal La 80 Mg Xr24h-cap (Propranolol hcl) ..... One tablet by mouth once daily  Aspirin 81 Mg Tbec (Aspirin) .Marland Kitchen... 1 tablet by mouth once daily  Problem # 2:  CHEST PAIN (ICD-786.50) Symptoms atypical. Previous stress test normal. No further workup at this time. His updated medication list for this problem includes:    Inderal La 80 Mg Xr24h-cap (Propranolol hcl) ..... One tablet by mouth once daily    Aspirin 81 Mg Tbec (Aspirin) .Marland Kitchen... 1 tablet by mouth once  daily  Problem # 3:  DYSLIPIDEMIA (ICD-272.4) Management per primary care.  Problem # 4:  POLYCYTHEMIA (ICD-289.0)  Patient Instructions: 1)  Your physician has recommended you make the following change in your medication: START INDERAL LA 80MG  ONCE DAILY 2)  Your physician wants you to follow-up in:6 MONTHS   You will receive a reminder letter in the mail two months in advance. If you don't receive a letter, please call our office to schedule the follow-up appointment. Prescriptions: INDERAL LA 80 MG XR24H-CAP (PROPRANOLOL HCL) one tablet by mouth once daily  #30 x 12   Entered by:   Deliah Goody, RN   Authorized by:   Ferman Hamming, MD, [facility]   Signed by:   Deliah Goody, RN on 05/17/2010   Method used:   Electronically to        [facility] [phone]* (retail)       [address]       [place], [place]  [address]       Ph: [phone]       Fax: [phone]   RxID:   [id number]

## 2010-06-09 NOTE — Letter (Signed)
Summary: [facility]  [facility]   Imported By: Sherian Rein 02/24/2009 10:40:29  _____________________________________________________________________  External Attachment:    Type:   Image     Comment:   External Document

## 2010-06-21 ENCOUNTER — Encounter: Payer: Self-pay | Admitting: Cardiology

## 2010-06-21 ENCOUNTER — Encounter (HOSPITAL_BASED_OUTPATIENT_CLINIC_OR_DEPARTMENT_OTHER): Payer: Managed Care, Other (non HMO) | Admitting: Hematology & Oncology

## 2010-06-21 ENCOUNTER — Other Ambulatory Visit: Payer: Self-pay | Admitting: Hematology & Oncology

## 2010-06-21 DIAGNOSIS — D45 Polycythemia vera: Secondary | ICD-10-CM

## 2010-06-21 DIAGNOSIS — D696 Thrombocytopenia, unspecified: Secondary | ICD-10-CM

## 2010-06-21 DIAGNOSIS — Z7982 Long term (current) use of aspirin: Secondary | ICD-10-CM

## 2010-06-21 LAB — CBC WITH DIFFERENTIAL (CANCER CENTER ONLY)
BASO#: 0 10*3/uL (ref 0.0–0.2)
BASO%: 0.6 % (ref 0.0–2.0)
EOS%: 4.9 % (ref 0.0–7.0)
Eosinophils Absolute: 0.3 10*3/uL (ref 0.0–0.5)
HCT: 44.5 % (ref 38.7–49.9)
HGB: 14.9 g/dL (ref 13.0–17.1)
LYMPH#: 1.3 10*3/uL (ref 0.9–3.3)
LYMPH%: 25.2 % (ref 14.0–48.0)
MCH: 28.3 pg (ref 28.0–33.4)
MCHC: 33.4 g/dL (ref 32.0–35.9)
MCV: 85 fL (ref 82–98)
MONO#: 0.5 10*3/uL (ref 0.1–0.9)
MONO%: 10.3 % (ref 0.0–13.0)
NEUT#: 3.1 10*3/uL (ref 1.5–6.5)
NEUT%: 59 % (ref 40.0–80.0)
Platelets: 145 10*3/uL (ref 145–400)
RBC: 5.26 10*6/uL (ref 4.20–5.70)
RDW: 15.4 % — ABNORMAL HIGH (ref 10.5–14.6)
WBC: 5.2 10*3/uL (ref 4.0–10.0)

## 2010-06-21 LAB — RETICULOCYTES (CHCC)
ABS Retic: 63.2 10*3/uL (ref 19.0–186.0)
RBC.: 5.27 MIL/uL (ref 4.22–5.81)
Retic Ct Pct: 1.2 % (ref 0.4–3.1)

## 2010-06-21 LAB — FERRITIN: Ferritin: 15 ng/mL — ABNORMAL LOW (ref 22–322)

## 2010-06-21 LAB — LACTATE DEHYDROGENASE: LDH: 193 U/L (ref 94–250)

## 2010-06-21 LAB — CHCC SATELLITE - SMEAR

## 2010-07-04 NOTE — Progress Notes (Signed)
Summary: [facility]: Office Progress Note  [facility]: Office Progress Note   Imported By: Earl Many 06/28/2010 09:18:03  _____________________________________________________________________  External Attachment:    Type:   Image     Comment:   External Document

## 2010-07-18 NOTE — Progress Notes (Signed)
Summary: [facility]: Office Visit  [facility]: Office Visit   Imported By: Earl Many 07/14/2010 13:28:18  _____________________________________________________________________  External Attachment:    Type:   Image     Comment:   External Document

## 2010-08-03 ENCOUNTER — Other Ambulatory Visit: Payer: Self-pay | Admitting: Hematology & Oncology

## 2010-08-03 ENCOUNTER — Encounter (HOSPITAL_BASED_OUTPATIENT_CLINIC_OR_DEPARTMENT_OTHER): Payer: Managed Care, Other (non HMO) | Admitting: Hematology & Oncology

## 2010-08-03 DIAGNOSIS — D45 Polycythemia vera: Secondary | ICD-10-CM

## 2010-08-03 DIAGNOSIS — Z7982 Long term (current) use of aspirin: Secondary | ICD-10-CM

## 2010-08-03 LAB — RETICULOCYTES (CHCC)
ABS Retic: 83.3 10*3/uL (ref 19.0–186.0)
RBC.: 5.55 MIL/uL (ref 4.22–5.81)
Retic Ct Pct: 1.5 % (ref 0.4–3.1)

## 2010-08-03 LAB — CBC WITH DIFFERENTIAL (CANCER CENTER ONLY)
BASO#: 0.2 10*3/uL (ref 0.0–0.2)
BASO%: 2.1 % — ABNORMAL HIGH (ref 0.0–2.0)
EOS%: 3.4 % (ref 0.0–7.0)
Eosinophils Absolute: 0.3 10*3/uL (ref 0.0–0.5)
HCT: 44.8 % (ref 38.7–49.9)
HGB: 14.8 g/dL (ref 13.0–17.1)
LYMPH#: 1.3 10*3/uL (ref 0.9–3.3)
LYMPH%: 13.9 % — ABNORMAL LOW (ref 14.0–48.0)
MCH: 27.2 pg — ABNORMAL LOW (ref 28.0–33.4)
MCHC: 33 g/dL (ref 32.0–35.9)
MCV: 82 fL (ref 82–98)
MONO#: 0.8 10*3/uL (ref 0.1–0.9)
MONO%: 8.8 % (ref 0.0–13.0)
NEUT#: 6.5 10*3/uL (ref 1.5–6.5)
NEUT%: 71.8 % (ref 40.0–80.0)
Platelets: 407 10*3/uL — ABNORMAL HIGH (ref 145–400)
RBC: 5.45 10*6/uL (ref 4.20–5.70)
RDW: 15.6 % (ref 11.1–15.7)
WBC: 9 10*3/uL (ref 4.0–10.0)

## 2010-08-03 LAB — LACTATE DEHYDROGENASE: LDH: 236 U/L (ref 94–250)

## 2010-08-03 LAB — CHCC SATELLITE - SMEAR

## 2010-08-03 LAB — COMPREHENSIVE METABOLIC PANEL
ALT: 36 U/L (ref 0–53)
AST: 32 U/L (ref 0–37)
Albumin: 4.7 g/dL (ref 3.5–5.2)
Alkaline Phosphatase: 62 U/L (ref 39–117)
BUN: 12 mg/dL (ref 6–23)
CO2: 29 mEq/L (ref 19–32)
Calcium: 9.2 mg/dL (ref 8.4–10.5)
Chloride: 106 mEq/L (ref 96–112)
Creatinine, Ser: 0.91 mg/dL (ref 0.40–1.50)
Glucose, Bld: 93 mg/dL (ref 70–99)
Potassium: 4.1 mEq/L (ref 3.5–5.3)
Sodium: 144 mEq/L (ref 135–145)
Total Bilirubin: 0.8 mg/dL (ref 0.3–1.2)
Total Protein: 6.7 g/dL (ref 6.0–8.3)

## 2010-08-03 LAB — FERRITIN: Ferritin: 8 ng/mL — ABNORMAL LOW (ref 22–322)

## 2010-09-21 ENCOUNTER — Other Ambulatory Visit: Payer: Self-pay | Admitting: Hematology & Oncology

## 2010-09-21 ENCOUNTER — Encounter (HOSPITAL_BASED_OUTPATIENT_CLINIC_OR_DEPARTMENT_OTHER): Payer: Managed Care, Other (non HMO) | Admitting: Hematology & Oncology

## 2010-09-21 DIAGNOSIS — D696 Thrombocytopenia, unspecified: Secondary | ICD-10-CM

## 2010-09-21 DIAGNOSIS — Z7982 Long term (current) use of aspirin: Secondary | ICD-10-CM

## 2010-09-21 DIAGNOSIS — D45 Polycythemia vera: Secondary | ICD-10-CM

## 2010-09-21 LAB — CBC WITH DIFFERENTIAL (CANCER CENTER ONLY)
BASO#: 0.3 10*3/uL — ABNORMAL HIGH (ref 0.0–0.2)
BASO%: 2.9 % — ABNORMAL HIGH (ref 0.0–2.0)
EOS%: 4.6 % (ref 0.0–7.0)
Eosinophils Absolute: 0.5 10*3/uL (ref 0.0–0.5)
HCT: 44.9 % (ref 38.7–49.9)
HGB: 14.5 g/dL (ref 13.0–17.1)
LYMPH#: 1.5 10*3/uL (ref 0.9–3.3)
LYMPH%: 14.9 % (ref 14.0–48.0)
MCH: 24.2 pg — ABNORMAL LOW (ref 28.0–33.4)
MCHC: 32.3 g/dL (ref 32.0–35.9)
MCV: 75 fL — ABNORMAL LOW (ref 82–98)
MONO#: 0.8 10*3/uL (ref 0.1–0.9)
MONO%: 8.6 % (ref 0.0–13.0)
NEUT#: 6.8 10*3/uL — ABNORMAL HIGH (ref 1.5–6.5)
NEUT%: 69 % (ref 40.0–80.0)
Platelets: 429 10*3/uL — ABNORMAL HIGH (ref 145–400)
RBC: 5.99 10*6/uL — ABNORMAL HIGH (ref 4.20–5.70)
RDW: 15.9 % — ABNORMAL HIGH (ref 11.1–15.7)
WBC: 9.8 10*3/uL (ref 4.0–10.0)

## 2010-09-21 LAB — TECHNOLOGIST REVIEW CHCC SATELLITE

## 2010-09-21 LAB — FERRITIN: Ferritin: 7 ng/mL — ABNORMAL LOW (ref 22–322)

## 2010-09-21 LAB — CHCC SATELLITE - SMEAR

## 2010-09-21 LAB — RETICULOCYTES (CHCC)
ABS Retic: 76.7 10*3/uL (ref 19.0–186.0)
RBC.: 5.9 MIL/uL — ABNORMAL HIGH (ref 4.22–5.81)
Retic Ct Pct: 1.3 % (ref 0.4–3.1)

## 2010-09-22 NOTE — Op Note (Signed)
NAMEJARREN, PARA               ACCOUNT NO.:  [id number]   MEDICAL RECORD NO.:  [id number]          PATIENT TYPE:  OIB   LOCATION:  2895                         FACILITY:  [facility]   PHYSICIAN:  Beulah Gandy. Ashley Royalty, M.D. DATE OF BIRTH:  Jan 21, 1956   DATE OF PROCEDURE:  02/08/2004  DATE OF DISCHARGE:                                 OPERATIVE REPORT   PREOPERATIVE DIAGNOSIS:  Rhegmatogenous retinal detachment with lattice  degeneration, left eye.   POSTOPERATIVE DIAGNOSIS:  Rhegmatogenous retinal detachment with lattice  degeneration, left eye.   PROCEDURE:  Scleral buckle left eye, retinal photocoagulation  left eye.   SURGEON:  Beulah Gandy. Ashley Royalty, M.D.   ASSISTANT:  Bryan Lemma. Lundquist, P.A.   ANESTHESIA:  General.   DESCRIPTION OF PROCEDURE:  Usual prep and drape.  A 360 degrees limbal  peritomy.  Isolation of 4 rectus muscles on 2-0 silk.  Localization of break  at 4:30 o'clock. Scleral dissection from 12 o'clock to 7 o'clock to admit a  #279 intrascleral implant.  Diathermy placed in the bed.  Two sutures per  quadrant, for a total of 5 sutures placed in the scleral flaps.   The 279 implant was placed with a 508 G radial segment placed beneath the  4:30 break.  Perforation site chosen at 5 o'clock.  A small amount of thick,  colorless, subretinal fluid came forth.  The buckle elements were placed and  the scleral flaps were closed.  A 240 band placed around the eye with a belt  loop at 8 o'clock and  suture at 10 o'clock, 270 sleeve at 11 o'clock.   The indirect ophthalmoscopy showed the retina to be lying nicely on the  scleral buckle with a break well supported. Indirect ophthalmoscope laser  was moved into place, and 197 burns were placed around the retinal periphery  with a power of 500 milliwatts, 1000 microns each and 0.1 seconds each.  The  buckle was adjusted and trimmed, the band was adjusted and trimmed.  The  sutures were knotted and trimmed. The conjunctiva was  reposited with 7-0  chromic suture.  Polymixin and gentamicin were irrigated in Tenon's space.  Paracenteses x2 obtained a closing tension between 10 and 15 with a  Barraquer tonometer.  Decadron 10 mg was injected into the lower  subconjunctival space.  Marcaine was injected around the globe for postop  pain. Polymyxin and gentamicin were irrigated in Tenon's space. Atropine  solution was applied.  Polysporin, a patch and shield were placed.  The  patient was awakened and taken to recovery in satisfactory condition      JDM/MEDQ  D:  02/08/2004  T:  02/08/2004  Job:  [id number]

## 2010-09-22 NOTE — Assessment & Plan Note (Signed)
[name] [name]                           GASTROENTEROLOGY OFFICE NOTE   NAME:[name], [name]                        MRN:          [id number]  DATE:12/31/2005                            DOB:          August 24, 1955    REFERRING PHYSICIAN:  Barry [name]. [name] Harman, MD   REASON FOR CONSULTATION:  Chronic abdominal complaints.   HISTORY:  This is a 55 year old white male with a history of polycythemia  and dyslipidemia who was referred through the courtesy of Dr. Eloise [name]  regarding chronic abdominal complaints.  Patient reports a 30-year history  of postprandial abdominal cramping associated with increased intestinal  activity, lower abdominal pressure and gas, all followed by severe urgency  and generally loose bowel movements.  Defecation does result in relief.  Problems are worse around times of stress or travel.  He denies weight loss  or bleeding.  No fevers.  No significant nocturnal component.  He apparently  underwent a flexible sigmoidoscopy about 15 years ago and was told he had  diverticular disease.  He self-medicates himself with Metamucil at night,  which he states helps him have a predictable bowel movement in the morning  which may aid in the remainder of his day.  He will also use Imodium  sparingly if he is out in social situations which transiently seems to help.  No upper gastrointestinal complaints, such as heartburn, dysphagia, nausea  or vomiting.  Rarely will he have problems with constipation, often related  to the use of Imodium.  No significant bleeding other than occasional red  blood on the tissue.  He had had some weight loss last year by intention,  though has gained some weight recently.   PAST MEDICAL HISTORY:  1. Dyslipidemia.  2. Polycythemia for which he undergoes phlebotomy and is on medication.   PAST SURGICAL HISTORY:  1. Right rotator cuff repair.  2. Vasectomy.  3. Bilateral retinal eye surgery.   ALLERGIES:  None  listed.   CURRENT MEDICATIONS:  1. Hydroxyurea 1000 mg daily.  2. Fenofibrate 160 mg daily.  3. Ambien CR 12.5 mg at night.  4. Propranolol p.r.n..  5. Aspirin 81 mg daily.  6. Multivitamin without iron.  7. Mirtazapine 30 mg daily.  8. Tylenol p.r.n.Marland Kitchen   FAMILY HISTORY:  No family history of gastrointestinal malignancy or  inflammatory bowel disease.   SOCIAL HISTORY:  The patient is married with two daughters.  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  He works  for the [employer] in the area of transportation, mostly as an  Production designer, theatre/television/film.  He does not smoke, occasionally has beer and wine.   REVIEW OF SYSTEMS:  Per diagnostic evaluation form.   PHYSICAL EXAMINATION:  Well-appearing male in no acute distress.  Blood  pressure is 128/76.  Heart rate is 68.  Weight is 185 degrees.  He is 6 foot  in height.  HEENT:  Sclerae anicteric.  Conjunctivae are pink.  Oral mucosa  intact.  No adenopathy.  LUNGS:  Clear.  HEART:  Regular.  ABDOMEN:  Soft  without tenderness, mass or hernia.  Spleen tip is palpable.  Liver is  normal in terms of size.  RECTAL:  Exam is omitted.  Recent Hemoccult  studies were negative.  EXTREMITIES:  Without edema.   LABORATORY DATA:  Laboratories 11/12/2005 reveal normal comprehensive  metabolic panel, CBC, and lipids.  As well, normal urinalysis and PSA.   IMPRESSION:  This is a 55 year old gentleman with chronic lower abdominal  complaints, principally manifested by postprandial or stress-induced  cramping with urgency and loose stools.  Symptoms consistent with irritable  bowel syndrome.   RECOMMENDATIONS:  1. Obtain tissue transglutaminase antibody as a screening test for occult      celiac sprue.  2. Schedule colonoscopy to evaluate symptoms and exclude occult      microscopic colitis or occult inflammatory bowel disease (though      unlikely). As well, at 75-1/53 years old, provide colorectal neoplasia       screening.  The nature of the procedure, as well as the risks, benefits      and alternatives were discussed in great detail.  He understood and      agreed to proceed.                                   [name] [name]. [name] [name]., MD   [name] [name]  DD:  12/31/2005  DT:  01/01/2006  Job #:  [id number]   cc:   [name] [name]. [name] Harman, MD  [name] Phi. [name] Hidalgo, MD

## 2010-09-26 ENCOUNTER — Ambulatory Visit (HOSPITAL_BASED_OUTPATIENT_CLINIC_OR_DEPARTMENT_OTHER)
Admission: RE | Admit: 2010-09-26 | Discharge: 2010-09-26 | Disposition: A | Discharge status: Home / Self Care | Payer: Managed Care, Other (non HMO) | Source: Ambulatory Visit | Attending: Hematology & Oncology | Admitting: Hematology & Oncology

## 2010-09-26 DIAGNOSIS — K824 Cholesterolosis of gallbladder: Secondary | ICD-10-CM | POA: Insufficient documentation

## 2010-09-26 DIAGNOSIS — D45 Polycythemia vera: Secondary | ICD-10-CM

## 2010-09-26 DIAGNOSIS — R161 Splenomegaly, not elsewhere classified: Secondary | ICD-10-CM | POA: Insufficient documentation

## 2010-10-31 ENCOUNTER — Ambulatory Visit (INDEPENDENT_AMBULATORY_CARE_PROVIDER_SITE_OTHER): Payer: Managed Care, Other (non HMO) | Admitting: Internal Medicine

## 2010-10-31 ENCOUNTER — Encounter: Payer: Self-pay | Admitting: Internal Medicine

## 2010-10-31 VITALS — BP 100/70 | HR 56 | Ht 72.0 in | Wt 199.0 lb

## 2010-10-31 DIAGNOSIS — B49 Unspecified mycosis: Secondary | ICD-10-CM

## 2010-10-31 DIAGNOSIS — R1084 Generalized abdominal pain: Secondary | ICD-10-CM

## 2010-10-31 DIAGNOSIS — B689 Taeniasis, unspecified: Secondary | ICD-10-CM

## 2010-10-31 DIAGNOSIS — B369 Superficial mycosis, unspecified: Secondary | ICD-10-CM

## 2010-10-31 DIAGNOSIS — K589 Irritable bowel syndrome without diarrhea: Secondary | ICD-10-CM

## 2010-10-31 DIAGNOSIS — R197 Diarrhea, unspecified: Secondary | ICD-10-CM

## 2010-10-31 MED ORDER — CILIDINIUM-CHLORDIAZEPOXIDE 2.5-5 MG PO CAPS: 1.0000 | ORAL_CAPSULE | Freq: Three times a day (TID) | ORAL | Status: DC

## 2010-10-31 NOTE — Patient Instructions (Signed)
Lamisil cream OTC use twice daily x 4 weeks Librax #100 take 1 before meals as needed   Rx. Sent to your pharmacy Lomotil as needed for diarrhea Follow-up in 4-6 weeks

## 2010-10-31 NOTE — Progress Notes (Signed)
HISTORY OF PRESENT ILLNESS:  [name] [name] is a 55 y.o. male with long-standing diarrhea predominant irritable bowel syndrome. He presents today with chief complaints of tender perianal skin, abdominal pain, ongoing diarrhea. Patient was evaluated for chronic abdominal complaints in August of 2007. See that dictation. Subsequent complete colonoscopy including ileal intubation revealed diverticulosis. Random colon biopsies were normal. It appeared therapies including fiber, Imodium, and Levbid. He was to followup thereafter, but did not. He has not been seen since February 2010 when he presented with a chief complaint of rectal bleeding. Repeat colonoscopy revealed diverticulosis, internal hemorrhoids parentheses as a cause for bleeding) and a diminutive colon polyp (tubular adenoma) which was removed. Routine followup in 5 years recommended. Patient has a history of polycythemia for which he undergoes chronic phlebotomy. Recent abdominal ultrasound was unremarkable except for mild splenomegaly and gallbladder polyp. He continues with intermittent abdominal cramping described as sharp. As well her urgency with diarrhea. Problems are exacerbated by meals and stress. He feels that his symptoms have been worse over the past 3-5 months. No bleeding or fevers. Weight has been stable  REVIEW OF SYSTEMS:  All non-GI ROS negative except for anxiety, arthritis, back pain, fatigue, palpitations, muscle cramps, insomnia, pain with urination and urinary frequency.  Past Medical History  Diagnosis Date  . Other and unspecified hyperlipidemia   . Diverticulosis of colon (without mention of hemorrhage)   . Polycythemia, secondary   . Nephrolithiasis   . IBS (irritable bowel syndrome)     Past Surgical History  Procedure Date  . Rotator cuff repair     rt.  . Vasectomy   . Retinal detachment surgery     Social History Norvell Caswell  reports that he has never smoked. He has never used smokeless tobacco. He  reports that he drinks alcohol. He reports that he does not use illicit drugs.  family history includes Aortic aneurysm in his father and Heart attack in his brother.  No Known Allergies     PHYSICAL EXAMINATION: Vital signs: BP 100/70  Pulse 56  Ht 6' (1.829 m)  Wt 199 lb (90.266 kg)  BMI 26.99 kg/m2  Constitutional: generally well-appearing, no acute distress Psychiatric: alert and oriented x3, cooperative Eyes: extraocular movements intact, anicteric, conjunctiva pink Mouth: oral pharynx moist, no lesions Neck: supple no lymphadenopathy Cardiovascular: heart regular rate and rhythm, no murmur Lungs: clear to auscultation bilaterally Abdomen: soft, nontender, nondistended, no obvious ascites, no peritoneal signs, normal bowel sounds, no organomegaly Rectal: Excoriated perianal skin, possibly fungal related Extremities: no lower extremity edema bilaterally Skin: no lesions on visible extremities Neuro: No focal deficits.   ASSESSMENT:  #1. Diarrhea predominant irritable bowel syndrome. Worsening symptoms over 3-5 months #2. Excoriated perianal skin, likely fungal related #3. History of adenomatous polyps. Last colonoscopy 2010   PLAN:  #1. Prescribed Librax 1 by mouth a.c. when necessary #2. Imodium when necessary #3. Lamisil cream twice a day to affected area #4. Keep affected area dry #5. Routine office followup in 4-6 weeks #6. Surveillance colonoscopy planned for 2015

## 2010-11-15 ENCOUNTER — Other Ambulatory Visit: Payer: Self-pay | Admitting: Hematology & Oncology

## 2010-11-15 ENCOUNTER — Encounter (HOSPITAL_BASED_OUTPATIENT_CLINIC_OR_DEPARTMENT_OTHER): Payer: Managed Care, Other (non HMO) | Admitting: Hematology & Oncology

## 2010-11-15 DIAGNOSIS — D45 Polycythemia vera: Secondary | ICD-10-CM

## 2010-11-15 LAB — CBC WITH DIFFERENTIAL (CANCER CENTER ONLY)
BASO#: 0.3 10*3/uL — ABNORMAL HIGH (ref 0.0–0.2)
BASO%: 2.9 % — ABNORMAL HIGH (ref 0.0–2.0)
EOS%: 5.5 % (ref 0.0–7.0)
Eosinophils Absolute: 0.6 10*3/uL — ABNORMAL HIGH (ref 0.0–0.5)
HCT: 44.2 % (ref 38.7–49.9)
HGB: 14.6 g/dL (ref 13.0–17.1)
LYMPH#: 1.7 10*3/uL (ref 0.9–3.3)
LYMPH%: 15.5 % (ref 14.0–48.0)
MCH: 23.2 pg — ABNORMAL LOW (ref 28.0–33.4)
MCHC: 33 g/dL (ref 32.0–35.9)
MCV: 70 fL — ABNORMAL LOW (ref 82–98)
MONO#: 0.8 10*3/uL (ref 0.1–0.9)
MONO%: 7.4 % (ref 0.0–13.0)
NEUT#: 7.4 10*3/uL — ABNORMAL HIGH (ref 1.5–6.5)
NEUT%: 68.7 % (ref 40.0–80.0)
Platelets: 527 10*3/uL — ABNORMAL HIGH (ref 145–400)
RBC: 6.3 10*6/uL — ABNORMAL HIGH (ref 4.20–5.70)
RDW: 18 % — ABNORMAL HIGH (ref 11.1–15.7)
WBC: 10.8 10*3/uL — ABNORMAL HIGH (ref 4.0–10.0)

## 2010-11-15 LAB — FERRITIN: Ferritin: 8 ng/mL — ABNORMAL LOW (ref 22–322)

## 2010-11-15 LAB — RETICULOCYTES (CHCC)
ABS Retic: 93 10*3/uL (ref 19.0–186.0)
RBC.: 6.2 MIL/uL — ABNORMAL HIGH (ref 4.22–5.81)
Retic Ct Pct: 1.5 % (ref 0.4–2.3)

## 2010-11-15 LAB — IRON AND TIBC
Iron: 21 ug/dL — ABNORMAL LOW (ref 42–165)
UIBC: >450 ug/dL

## 2010-11-15 LAB — VITAMIN D 25 HYDROXY (VIT D DEFICIENCY, FRACTURES): Vit D, 25-Hydroxy: 40 ng/mL (ref 30–89)

## 2010-11-15 LAB — LACTATE DEHYDROGENASE: LDH: 262 U/L — ABNORMAL HIGH (ref 94–250)

## 2010-11-15 LAB — CHCC SATELLITE - SMEAR

## 2010-11-28 ENCOUNTER — Encounter: Payer: Self-pay | Admitting: Internal Medicine

## 2010-11-28 ENCOUNTER — Ambulatory Visit (INDEPENDENT_AMBULATORY_CARE_PROVIDER_SITE_OTHER): Payer: Managed Care, Other (non HMO) | Admitting: Internal Medicine

## 2010-11-28 VITALS — BP 114/82 | HR 60 | Ht 72.0 in | Wt 204.0 lb

## 2010-11-28 DIAGNOSIS — R21 Rash and other nonspecific skin eruption: Secondary | ICD-10-CM

## 2010-11-28 DIAGNOSIS — K589 Irritable bowel syndrome without diarrhea: Secondary | ICD-10-CM

## 2010-11-28 MED ORDER — ALIGN PO CAPS: 1.0000 | ORAL_CAPSULE | Freq: Every day | ORAL | Status: DC

## 2010-11-28 NOTE — Patient Instructions (Signed)
Take the Align 1 per day for 1 month if this help you may purchase it over the counter. Please follow up as needed.

## 2010-11-28 NOTE — Progress Notes (Signed)
HISTORY OF PRESENT ILLNESS:  [name] [name] is a 55 y.o. male with a history of polycythemia, irritable bowel syndrome, and colon polyps. He was seen in 4 weeks ago regarding diarrhea predominant irritable bowel syndrome and excoriated perianal skin. See that dictation for details. For his IBS complaints, he was treated with Librax before meals as needed. He has used this periodically with good results. He has not been using Imodium. Still with symptoms, however. Next, Lamisil cream has helped his rash. Still with minimal symptoms. Finally, he reports ongoing problems with bloating as well as several month history of abdominal discomfort to the touch over the abdominal wall. Recent abdominal ultrasound negative except for splenomegaly  REVIEW OF SYSTEMS:  All non-GI ROS negative. Past Medical History  Diagnosis Date  . Other and unspecified hyperlipidemia   . Diverticulosis of colon (without mention of hemorrhage)   . Polycythemia, secondary   . Nephrolithiasis   . IBS (irritable bowel syndrome)     Past Surgical History  Procedure Date  . Rotator cuff repair     rt.  . Vasectomy   . Retinal detachment surgery     Social History Lelan Cush  reports that he has never smoked. He has never used smokeless tobacco. He reports that he drinks alcohol. He reports that he does not use illicit drugs.  family history includes Aortic aneurysm in his father and Heart attack in his brother.  No Known Allergies     PHYSICAL EXAMINATION:  Vital signs: BP 114/82  Pulse 60  Ht 6' (1.829 m)  Wt 204 lb (92.534 kg)  BMI 27.67 kg/m2 General: Well-developed, well-nourished, no acute distress HEENT: Sclerae are anicteric, conjunctiva pink. Oral mucosa intact Lungs: Clear Heart: Regular Abdomen: soft, abdominal wall tenderness, nondistended, no obvious ascites, no peritoneal signs, normal bowel sounds. No organomegaly. Extremities: No edema Psychiatric: alert and oriented x3. Cooperative      ASSESSMENT:  #1. Diarrhea predominant IBS. Improved with Librax. Will provide probiotic trial #2. Perianal fungal rash. Improved with Lamisil #3. History of adenomatous colon polyps. Surveillance up to date. Due for followup around March 2015 #4. Abdominal wall tenderness   PLAN:  #1. Continue Librax when necessary #2. Continue Imodium when necessary #3. Continue Lamisil until rash cleared #4. Surveillance colonoscopy 2015 #5. Prilosec probiotic Align. 4 weeks samples given #6. GI followup as needed

## 2011-01-03 ENCOUNTER — Other Ambulatory Visit: Payer: Self-pay | Admitting: Hematology & Oncology

## 2011-01-03 ENCOUNTER — Encounter (HOSPITAL_BASED_OUTPATIENT_CLINIC_OR_DEPARTMENT_OTHER): Payer: Managed Care, Other (non HMO) | Admitting: Hematology & Oncology

## 2011-01-03 DIAGNOSIS — D45 Polycythemia vera: Secondary | ICD-10-CM

## 2011-01-03 LAB — CBC WITH DIFFERENTIAL (CANCER CENTER ONLY)
BASO#: 0.3 10*3/uL — ABNORMAL HIGH (ref 0.0–0.2)
BASO%: 2.9 % — ABNORMAL HIGH (ref 0.0–2.0)
EOS%: 4.9 % (ref 0.0–7.0)
Eosinophils Absolute: 0.5 10*3/uL (ref 0.0–0.5)
HCT: 45.5 % (ref 38.7–49.9)
HGB: 15 g/dL (ref 13.0–17.1)
LYMPH#: 1.3 10*3/uL (ref 0.9–3.3)
LYMPH%: 11.8 % — ABNORMAL LOW (ref 14.0–48.0)
MCH: 22.8 pg — ABNORMAL LOW (ref 28.0–33.4)
MCHC: 33 g/dL (ref 32.0–35.9)
MCV: 69 fL — ABNORMAL LOW (ref 82–98)
MONO#: 0.8 10*3/uL (ref 0.1–0.9)
MONO%: 7.6 % (ref 0.0–13.0)
NEUT#: 7.8 10*3/uL — ABNORMAL HIGH (ref 1.5–6.5)
NEUT%: 72.8 % (ref 40.0–80.0)
Platelets: 564 10*3/uL — ABNORMAL HIGH (ref 145–400)
RBC: 6.58 10*6/uL — ABNORMAL HIGH (ref 4.20–5.70)
RDW: 19.7 % — ABNORMAL HIGH (ref 11.1–15.7)
WBC: 10.7 10*3/uL — ABNORMAL HIGH (ref 4.0–10.0)

## 2011-01-03 LAB — COMPREHENSIVE METABOLIC PANEL
ALT: 39 U/L (ref 0–53)
AST: 35 U/L (ref 0–37)
Albumin: 5 g/dL (ref 3.5–5.2)
Alkaline Phosphatase: 57 U/L (ref 39–117)
BUN: 17 mg/dL (ref 6–23)
CO2: 25 mEq/L (ref 19–32)
Calcium: 9.2 mg/dL (ref 8.4–10.5)
Chloride: 105 mEq/L (ref 96–112)
Creatinine, Ser: 0.84 mg/dL (ref 0.50–1.35)
Glucose, Bld: 92 mg/dL (ref 70–99)
Potassium: 4.2 mEq/L (ref 3.5–5.3)
Sodium: 141 mEq/L (ref 135–145)
Total Bilirubin: 0.8 mg/dL (ref 0.3–1.2)
Total Protein: 6.5 g/dL (ref 6.0–8.3)

## 2011-01-03 LAB — RETICULOCYTES (CHCC)
ABS Retic: 104.8 10*3/uL (ref 19.0–186.0)
RBC.: 6.55 MIL/uL — ABNORMAL HIGH (ref 4.22–5.81)
Retic Ct Pct: 1.6 % (ref 0.4–2.3)

## 2011-01-03 LAB — FERRITIN: Ferritin: 9 ng/mL — ABNORMAL LOW (ref 22–322)

## 2011-01-03 LAB — CHCC SATELLITE - SMEAR

## 2011-01-03 LAB — LACTATE DEHYDROGENASE: LDH: 340 U/L — ABNORMAL HIGH (ref 94–250)

## 2011-02-21 ENCOUNTER — Encounter (HOSPITAL_BASED_OUTPATIENT_CLINIC_OR_DEPARTMENT_OTHER): Payer: Managed Care, Other (non HMO) | Admitting: Hematology & Oncology

## 2011-02-21 ENCOUNTER — Other Ambulatory Visit: Payer: Self-pay | Admitting: Hematology & Oncology

## 2011-02-21 DIAGNOSIS — D45 Polycythemia vera: Secondary | ICD-10-CM

## 2011-02-21 LAB — CBC WITH DIFFERENTIAL (CANCER CENTER ONLY)
BASO#: 0.4 10*3/uL — ABNORMAL HIGH (ref 0.0–0.2)
BASO%: 2.9 % — ABNORMAL HIGH (ref 0.0–2.0)
EOS%: 4.6 % (ref 0.0–7.0)
Eosinophils Absolute: 0.6 10*3/uL — ABNORMAL HIGH (ref 0.0–0.5)
HCT: 43.7 % (ref 38.7–49.9)
HGB: 14.1 g/dL (ref 13.0–17.1)
LYMPH#: 1.5 10*3/uL (ref 0.9–3.3)
LYMPH%: 12.8 % — ABNORMAL LOW (ref 14.0–48.0)
MCH: 22.3 pg — ABNORMAL LOW (ref 28.0–33.4)
MCHC: 32.3 g/dL (ref 32.0–35.9)
MCV: 69 fL — ABNORMAL LOW (ref 82–98)
MONO#: 0.9 10*3/uL (ref 0.1–0.9)
MONO%: 7.3 % (ref 0.0–13.0)
NEUT#: 8.6 10*3/uL — ABNORMAL HIGH (ref 1.5–6.5)
NEUT%: 72.4 % (ref 40.0–80.0)
Platelets: 603 10*3/uL — ABNORMAL HIGH (ref 145–400)
RBC: 6.31 10*6/uL — ABNORMAL HIGH (ref 4.20–5.70)
RDW: 18.8 % — ABNORMAL HIGH (ref 11.1–15.7)
WBC: 11.9 10*3/uL — ABNORMAL HIGH (ref 4.0–10.0)

## 2011-02-21 LAB — LACTATE DEHYDROGENASE: LDH: 328 U/L — ABNORMAL HIGH (ref 94–250)

## 2011-02-21 LAB — RETICULOCYTES (CHCC)
ABS Retic: 87.6 10*3/uL (ref 19.0–186.0)
RBC.: 6.26 MIL/uL — ABNORMAL HIGH (ref 4.22–5.81)
Retic Ct Pct: 1.4 % (ref 0.4–2.3)

## 2011-02-21 LAB — COMPREHENSIVE METABOLIC PANEL
ALT: 62 U/L — ABNORMAL HIGH (ref 0–53)
AST: 36 U/L (ref 0–37)
Albumin: 4.8 g/dL (ref 3.5–5.2)
Alkaline Phosphatase: 68 U/L (ref 39–117)
BUN: 14 mg/dL (ref 6–23)
CO2: 26 mEq/L (ref 19–32)
Calcium: 9.2 mg/dL (ref 8.4–10.5)
Chloride: 106 mEq/L (ref 96–112)
Creatinine, Ser: 0.85 mg/dL (ref 0.50–1.35)
Glucose, Bld: 98 mg/dL (ref 70–99)
Potassium: 4.2 mEq/L (ref 3.5–5.3)
Sodium: 141 mEq/L (ref 135–145)
Total Bilirubin: 0.6 mg/dL (ref 0.3–1.2)
Total Protein: 6.4 g/dL (ref 6.0–8.3)

## 2011-02-21 LAB — IRON AND TIBC
Iron: 28 ug/dL — ABNORMAL LOW (ref 42–165)
UIBC: >450 ug/dL — ABNORMAL HIGH (ref 125–400)

## 2011-02-21 LAB — CHCC SATELLITE - SMEAR

## 2011-02-21 LAB — FERRITIN: Ferritin: 7 ng/mL — ABNORMAL LOW (ref 22–322)

## 2011-02-21 LAB — VITAMIN D 25 HYDROXY (VIT D DEFICIENCY, FRACTURES): Vit D, 25-Hydroxy: 51 ng/mL (ref 30–89)

## 2011-04-06 ENCOUNTER — Other Ambulatory Visit (HOSPITAL_BASED_OUTPATIENT_CLINIC_OR_DEPARTMENT_OTHER): Payer: Managed Care, Other (non HMO) | Admitting: Lab

## 2011-04-06 ENCOUNTER — Other Ambulatory Visit: Payer: Self-pay | Admitting: Hematology & Oncology

## 2011-04-06 ENCOUNTER — Ambulatory Visit (HOSPITAL_BASED_OUTPATIENT_CLINIC_OR_DEPARTMENT_OTHER): Payer: Managed Care, Other (non HMO) | Admitting: Hematology & Oncology

## 2011-04-06 VITALS — BP 137/93 | HR 65 | Temp 97.3°F | Ht 72.0 in | Wt 204.0 lb

## 2011-04-06 DIAGNOSIS — D45 Polycythemia vera: Secondary | ICD-10-CM

## 2011-04-06 DIAGNOSIS — D751 Secondary polycythemia: Secondary | ICD-10-CM

## 2011-04-06 DIAGNOSIS — M542 Cervicalgia: Secondary | ICD-10-CM

## 2011-04-06 LAB — CBC WITH DIFFERENTIAL (CANCER CENTER ONLY)
BASO#: 0.3 10*3/uL — ABNORMAL HIGH (ref 0.0–0.2)
BASO%: 2.8 % — ABNORMAL HIGH (ref 0.0–2.0)
EOS%: 5.5 % (ref 0.0–7.0)
Eosinophils Absolute: 0.6 10*3/uL — ABNORMAL HIGH (ref 0.0–0.5)
HCT: 47.2 % (ref 38.7–49.9)
HGB: 15.3 g/dL (ref 13.0–17.1)
LYMPH#: 1.7 10*3/uL (ref 0.9–3.3)
LYMPH%: 15.6 % (ref 14.0–48.0)
MCH: 22.8 pg — ABNORMAL LOW (ref 28.0–33.4)
MCHC: 32.4 g/dL (ref 32.0–35.9)
MCV: 70 fL — ABNORMAL LOW (ref 82–98)
MONO#: 0.8 10*3/uL (ref 0.1–0.9)
MONO%: 7.1 % (ref 0.0–13.0)
NEUT#: 7.5 10*3/uL — ABNORMAL HIGH (ref 1.5–6.5)
NEUT%: 69 % (ref 40.0–80.0)
Platelets: 394 10*3/uL (ref 145–400)
RBC: 6.72 10*6/uL — ABNORMAL HIGH (ref 4.20–5.70)
RDW: 20.2 % — ABNORMAL HIGH (ref 11.1–15.7)
WBC: 10.9 10*3/uL — ABNORMAL HIGH (ref 4.0–10.0)

## 2011-04-06 LAB — IRON AND TIBC
%SAT: 7 % — ABNORMAL LOW (ref 20–55)
Iron: 33 ug/dL — ABNORMAL LOW (ref 42–165)
TIBC: 474 ug/dL — ABNORMAL HIGH (ref 215–435)
UIBC: 441 ug/dL — ABNORMAL HIGH (ref 125–400)

## 2011-04-06 LAB — FERRITIN: Ferritin: 8 ng/mL — ABNORMAL LOW (ref 22–322)

## 2011-04-06 LAB — CHCC SATELLITE - SMEAR

## 2011-04-06 LAB — LACTATE DEHYDROGENASE: LDH: 283 U/L — ABNORMAL HIGH (ref 94–250)

## 2011-04-06 NOTE — Progress Notes (Signed)
This office note has been dictated.

## 2011-04-06 NOTE — Progress Notes (Signed)
CC:   [name] [name]. [name], M.D. [name]. [name], MD [name]. [name], MD, [facility]  DIAGNOSIS:  Polycythemia vera, JAK2 positive.  CURRENT THERAPY: 1. Hydrea 500 mg p.o. b.i.d. 2. Aspirin 81 mg p.o. daily. 3. Phlebotomy to maintain hematocrit less than 45%.  INTERIM HISTORY:  [name] [name] comes in for followup.  He is doing well. He has had a good Thanksgiving.  He and his wife are heading down to [facility] next weekend to see the daughter who goes to [employer].  He is doing well with the increased dose of Hydrea.  He has not had any nausea or vomiting.  He has not noted any cough.  There is no fever.  He has had no rashes. There has been no leg swelling.  He does state that he had a brief episode of pain in his right neck. This seemed to radiate up to his head and the back of his scalp.  This seemed to resolve on its own.  His last phlebotomy was back in August.  PHYSICAL EXAM:  General:  This is a well-developed well-nourished white gentleman in no obvious distress.  Vital Signs:  Temperature of 97.3, pulse 65, respiratory rate 18, blood pressure 137/93.  Weight is 204. Head/Neck:  Normocephalic, atraumatic skull.  There are no ocular or oral lesions.  There are no palpable cervical or supraclavicular lymph nodes.  Lungs:  Clear to percussion and auscultation bilaterally. Cardiac:  Regular rate and rhythm with a normal S1, S2.  There are no murmurs, rubs or bruits.  Abdomen:  Soft with good bowel sounds.  There is no palpable abdominal mass.  There is no fluid wave.  There is no palpable hepatosplenomegaly.  Back:  No tenderness of the spine, ribs, or hips.  Extremities:  No clubbing, cyanosis or edema.  LABORATORY STUDIES:  White cell count 10.9, hemoglobin 15.3, hematocrit 47.2, platelet count 394.  IMPRESSION:  [name] [name] is a 55 year old gentleman with polycythemia. He has done well with this.  He has had no complications to date from this.  His Hydrea dose is doing  well.  I will maintain him on his b.i.d. dosing.  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  It sounds suggestive of carotidynia.  We will phlebotomize him now.  He actually has done quite well.  He typically gets phlebotomized every 3-4 months.  We will have him come back in 6 weeks for followup.  I do not see the need for any x-ray, tests or labs in between visits.    ______________________________ Josph Macho, M.D. PRE/MEDQ  D:  04/06/2011  T:  04/06/2011  Job:  [id number]

## 2011-04-21 ENCOUNTER — Emergency Department (INDEPENDENT_AMBULATORY_CARE_PROVIDER_SITE_OTHER): Payer: Managed Care, Other (non HMO)

## 2011-04-21 ENCOUNTER — Emergency Department (HOSPITAL_BASED_OUTPATIENT_CLINIC_OR_DEPARTMENT_OTHER)
Admission: EM | Admit: 2011-04-21 | Discharge: 2011-04-21 | Disposition: A | Discharge status: Home / Self Care | Payer: Managed Care, Other (non HMO) | Attending: Emergency Medicine | Admitting: Emergency Medicine

## 2011-04-21 ENCOUNTER — Encounter (HOSPITAL_BASED_OUTPATIENT_CLINIC_OR_DEPARTMENT_OTHER): Payer: Self-pay | Admitting: *Deleted

## 2011-04-21 DIAGNOSIS — K589 Irritable bowel syndrome without diarrhea: Secondary | ICD-10-CM | POA: Insufficient documentation

## 2011-04-21 DIAGNOSIS — E785 Hyperlipidemia, unspecified: Secondary | ICD-10-CM | POA: Insufficient documentation

## 2011-04-21 DIAGNOSIS — X58XXXA Exposure to other specified factors, initial encounter: Secondary | ICD-10-CM

## 2011-04-21 DIAGNOSIS — W19XXXA Unspecified fall, initial encounter: Secondary | ICD-10-CM | POA: Insufficient documentation

## 2011-04-21 DIAGNOSIS — S99929A Unspecified injury of unspecified foot, initial encounter: Secondary | ICD-10-CM

## 2011-04-21 DIAGNOSIS — Z79899 Other long term (current) drug therapy: Secondary | ICD-10-CM | POA: Insufficient documentation

## 2011-04-21 DIAGNOSIS — S8990XA Unspecified injury of unspecified lower leg, initial encounter: Secondary | ICD-10-CM

## 2011-04-21 DIAGNOSIS — S93409A Sprain of unspecified ligament of unspecified ankle, initial encounter: Secondary | ICD-10-CM | POA: Insufficient documentation

## 2011-04-21 MED ORDER — HYDROCODONE-ACETAMINOPHEN 5-500 MG PO TABS: 2.0000 | ORAL_TABLET | Freq: Four times a day (QID) | ORAL | Status: AC | PRN

## 2011-04-21 MED ORDER — OXYCODONE-ACETAMINOPHEN 5-325 MG PO TABS
1.0000 | ORAL_TABLET | Freq: Once | ORAL | Status: AC
  Administered 2011-04-21: 1 via ORAL
  Filled 2011-04-21: qty 1

## 2011-04-21 NOTE — ED Notes (Signed)
Neuro and circulation intact post splint placement, verified by this RN.

## 2011-04-21 NOTE — ED Notes (Signed)
Pt states he missed a step and twisted his left ankle earlier this p.m.

## 2011-04-21 NOTE — ED Provider Notes (Signed)
History  Scribed for Merlie Noga K Lue Dubuque-Rasch, MD, the patient was seen in room Room/bed info not found. This chart was scribed by Candelaria Stagers. The patient's care started at 8:35 PM    CSN: [id number] Arrival date & time: 04/21/2011  8:16 PM   First MD Initiated Contact with Patient 04/21/11 2012      Chief Complaint  Patient presents with  . Ankle Pain     Patient is a 55 y.o. male presenting with ankle pain.  Ankle Pain  The incident occurred less than 1 hour ago. The incident occurred at home. The injury mechanism was a fall. The pain is present in the left ankle. The quality of the pain is described as sharp. The pain is at a severity of 8/10. The pain is severe. The pain has been constant since onset. Pertinent negatives include no numbness, no inability to bear weight, no loss of motion, no muscle weakness, no loss of sensation and no tingling. He reports no foreign bodies present. The symptoms are aggravated by palpation. He has tried nothing for the symptoms. The treatment provided no relief.   [name] [name] is a 55 y.o. male who presents to the Emergency Department complaining of sharp left ankle pain after missing a step and twisting his ankle about 1 hour ago.  Pain is a 7/10 on palpation and 2/10 presently.  He is experiencing no other sx.     Past Medical History  Diagnosis Date  . Other and unspecified hyperlipidemia   . Diverticulosis of colon (without mention of hemorrhage)   . Polycythemia, secondary   . Nephrolithiasis   . IBS (irritable bowel syndrome)     Past Surgical History  Procedure Date  . Rotator cuff repair     rt.  . Vasectomy   . Retinal detachment surgery     Family History  Problem Relation Age of Onset  . Aortic aneurysm Father   . Heart attack Brother     History  Substance Use Topics  . Smoking status: Never Smoker   . Smokeless tobacco: Never Used  . Alcohol Use: Yes     3-4 beers a week      Review of Systems    Constitutional: Negative for fever.       10 Systems reviewed and are negative for acute change except as noted in the HPI.  HENT: Negative for congestion.   Eyes: Negative for discharge and redness.  Respiratory: Negative for cough and shortness of breath.   Cardiovascular: Negative for chest pain.  Gastrointestinal: Negative for vomiting and abdominal pain.  Musculoskeletal: Positive for arthralgias (left ankle). Negative for back pain.  Skin: Negative for rash.  Neurological: Negative for tingling, syncope, numbness and headaches.  Hematological: Negative.   Psychiatric/Behavioral: Negative.        No behavior change.    Allergies  Review of patient's allergies indicates no known allergies.  Home Medications   Current Outpatient Rx  Name Route Sig Dispense Refill  . ACETAMINOPHEN 500 MG PO TABS Oral Take 1,000 mg by mouth every 6 (six) hours as needed. For pain     . ASPIRIN 81 MG PO TABS Oral Take 81 mg by mouth daily.      Marland Kitchen CLINDINIUM-CHLORDIAZEPOXIDE 2.5-5 MG PO CAPS Oral Take 1 capsule by mouth 3 (three) times daily before meals. 100 capsule 6  . DIPHENHYDRAMINE HCL 50 MG/30ML PO LIQD Oral Take by mouth.     Marland Kitchen DIPHENOXYLATE-ATROPINE 2.5-0.025 MG PO TABS Oral Take  1 tablet by mouth 4 (four) times daily as needed. For diarrhea    . HYDROXYUREA 500 MG PO CAPS Oral Take 500 mg by mouth 2 (two) times daily. May take with food to minimize GI side effects.    Marland Kitchen HYOSCYAMINE SULFATE 0.375 MG PO TB12 Oral Take 0.375 mg by mouth every 12 (twelve) hours as needed. For cramping    . NAPROXEN 500 MG PO TABS Oral Take 500 mg by mouth 2 (two) times daily with a meal. For pain     . PROPRANOLOL HCL 80 MG PO TABS Oral Take 80 mg by mouth daily.      Marland Kitchen ZOLPIDEM TARTRATE ER 12.5 MG PO TBCR Oral Take 12.5 mg by mouth at bedtime as needed. For sleep    . IMIQUIMOD 5 % EX CREA        BP 127/78  Pulse 68  Temp(Src) 98.1 F (36.7 C) (Oral)  Resp 20  Ht 6' (1.829 m)  Wt 195 lb (88.451 kg)   BMI 26.45 kg/m2  SpO2 98%  Physical Exam  Nursing note and vitals reviewed. Constitutional: He is oriented to person, place, and time. He appears well-developed and well-nourished. No distress.  HENT:  Head: Normocephalic and atraumatic.  Mouth/Throat: Oropharynx is clear and moist. No oropharyngeal exudate.  Eyes: Conjunctivae and EOM are normal. Pupils are equal, round, and reactive to light.  Neck: Normal range of motion. Neck supple.  Cardiovascular: Normal rate and regular rhythm.  Exam reveals no gallop and no friction rub.   No murmur heard. Pulmonary/Chest: Effort normal. He has no wheezes. He has no rales.  Abdominal: Soft. Bowel sounds are normal.  Musculoskeletal: Normal range of motion. He exhibits tenderness.       Pain over the anterior ligament and lateral malleolus. Neg. Tompson test. 2+ dorsalis pedis.    Neurological: He is alert and oriented to person, place, and time.  Skin: Skin is warm and dry.  Psychiatric: He has a normal mood and affect. His behavior is normal.    ED Course  Procedures   DIAGNOSTIC STUDIES: Oxygen Saturation is 98% on room air, normal by my interpretation.    COORDINATION OF CARE:  8:15PM Ordered: DG ANKLE COMPLETE LEFT  8:48PM Ordered: Apply ASO ankle ; oxyCODONE-acetaminophen (PERCOCET) 5-325 MG per tablet 1 tablet    Labs Reviewed - No data to display Dg Ankle Complete Left  04/21/2011  *RADIOLOGY REPORT*  Clinical Data: Ankle injury  LEFT ANKLE COMPLETE - 3+ VIEW  Comparison: None.  Findings: Normal alignment and no fracture.  Mild lateral soft tissue swelling.  IMPRESSION: Negative for fracture.  Original Report Authenticated By: Camelia Phenes, M.D.     No diagnosis found.    MDM  Negative for fracture per radiology report, will give air cast and percocet for pain. Return for worsening symptoms.  Patient and family verbalize understanding  I personally performed the services described in this documentation, which was  scribed in my presence. The recorded information has been reviewed and considered.        Jasmine Awe, MD 04/21/11 2057

## 2011-05-18 ENCOUNTER — Other Ambulatory Visit: Payer: Self-pay | Admitting: Internal Medicine

## 2011-05-29 ENCOUNTER — Other Ambulatory Visit: Payer: Self-pay | Admitting: Cardiology

## 2011-05-29 MED ORDER — PROPRANOLOL HCL 80 MG PO TABS: 80.0000 mg | ORAL_TABLET | Freq: Every day | ORAL | Status: DC

## 2011-05-29 MED ORDER — PROPRANOLOL HCL ER BEADS 80 MG PO CP24: 80.0000 mg | ORAL_CAPSULE | Freq: Every day | ORAL | Status: DC

## 2011-05-31 ENCOUNTER — Other Ambulatory Visit (HOSPITAL_BASED_OUTPATIENT_CLINIC_OR_DEPARTMENT_OTHER): Payer: Managed Care, Other (non HMO) | Admitting: Lab

## 2011-05-31 ENCOUNTER — Ambulatory Visit (HOSPITAL_BASED_OUTPATIENT_CLINIC_OR_DEPARTMENT_OTHER): Payer: Managed Care, Other (non HMO) | Admitting: Hematology & Oncology

## 2011-05-31 VITALS — BP 121/82 | HR 68 | Temp 97.3°F | Ht 72.0 in | Wt 206.0 lb

## 2011-05-31 DIAGNOSIS — D751 Secondary polycythemia: Secondary | ICD-10-CM

## 2011-05-31 DIAGNOSIS — D45 Polycythemia vera: Secondary | ICD-10-CM

## 2011-05-31 LAB — COMPREHENSIVE METABOLIC PANEL
ALT: 29 U/L (ref 0–53)
AST: 26 U/L (ref 0–37)
Albumin: 4.7 g/dL (ref 3.5–5.2)
Alkaline Phosphatase: 56 U/L (ref 39–117)
BUN: 14 mg/dL (ref 6–23)
CO2: 23 mEq/L (ref 19–32)
Calcium: 8.8 mg/dL (ref 8.4–10.5)
Chloride: 107 mEq/L (ref 96–112)
Creatinine, Ser: 0.97 mg/dL (ref 0.50–1.35)
Glucose, Bld: 87 mg/dL (ref 70–99)
Potassium: 4 mEq/L (ref 3.5–5.3)
Sodium: 141 mEq/L (ref 135–145)
Total Bilirubin: 0.6 mg/dL (ref 0.3–1.2)
Total Protein: 6.2 g/dL (ref 6.0–8.3)

## 2011-05-31 LAB — CBC WITH DIFFERENTIAL (CANCER CENTER ONLY)
BASO#: 0.2 10*3/uL (ref 0.0–0.2)
BASO%: 2.4 % — ABNORMAL HIGH (ref 0.0–2.0)
EOS%: 3.3 % (ref 0.0–7.0)
Eosinophils Absolute: 0.2 10*3/uL (ref 0.0–0.5)
HCT: 44.3 % (ref 38.7–49.9)
HGB: 14.3 g/dL (ref 13.0–17.1)
LYMPH#: 0.9 10*3/uL (ref 0.9–3.3)
LYMPH%: 12.9 % — ABNORMAL LOW (ref 14.0–48.0)
MCH: 24.7 pg — ABNORMAL LOW (ref 28.0–33.4)
MCHC: 32.3 g/dL (ref 32.0–35.9)
MCV: 77 fL — ABNORMAL LOW (ref 82–98)
MONO#: 0.6 10*3/uL (ref 0.1–0.9)
MONO%: 8.4 % (ref 0.0–13.0)
NEUT#: 4.9 10*3/uL (ref 1.5–6.5)
NEUT%: 73 % (ref 40.0–80.0)
Platelets: 400 10*3/uL (ref 145–400)
RBC: 5.78 10*6/uL — ABNORMAL HIGH (ref 4.20–5.70)
RDW: 23.8 % — ABNORMAL HIGH (ref 11.1–15.7)
WBC: 6.7 10*3/uL (ref 4.0–10.0)

## 2011-05-31 LAB — FERRITIN: Ferritin: 9 ng/mL — ABNORMAL LOW (ref 22–322)

## 2011-05-31 LAB — LACTATE DEHYDROGENASE: LDH: 237 U/L (ref 94–250)

## 2011-05-31 NOTE — Progress Notes (Signed)
This office note has been dictated.

## 2011-06-01 ENCOUNTER — Telehealth: Payer: Self-pay | Admitting: Hematology & Oncology

## 2011-06-01 NOTE — Progress Notes (Signed)
CC:   Barry Dienes. [name], M.D. [name]. [name], MD [name]. [name], MD, [facility]  DIAGNOSIS:  Polycythemia vera, JAK2 positive.  CURRENT THERAPY: 1. Hydrea 500 mg p.o. b.i.d. 2. Aspirin 81 mg p.o. daily. 3. Phlebotomy to maintain hematocrit less than 45%.  INTERIM HISTORY:  [name] comes in for follow-up.  He is doing okay.  He was last phlebotomized on November 30th.  By that point in time, his hematocrit was 47.3.  He had no problems over the holidays.  He has had no complaints.  He did sprain his, I think, left ankle at his daughter's house.  Thankfully, he did not require a cast or any kind of walking boot.  He is still working without any difficulties.  There has been no nausea or vomiting with the Hydrea.  He has had no bleeding.  There has been no change in bowel or bladder habits.  He has not had any abdominal pain.  We want to make sure that his iron is nice and low.  When we last saw him in November, his ferritin was only 8 with iron saturation of 7.  PHYSICAL EXAMINATION:  General Appearance:  This is a well-developed, well-nourished white gentleman in no obvious distress.  Vital Signs: Show a temperature of 97.3, pulse 68, respiratory rate 14, blood pressure 121/82.  Weight is 206.  Head and Neck Exam:  Shows a normocephalic, atraumatic skull.  There are no ocular or oral lesions. There are no palpable cervical or supraclavicular lymph nodes.  Lungs: Clear to percussion and auscultation bilaterally.  Cardiac Exam: Regular rate and rhythm with a normal S1 and S2.  There are no murmurs, rubs or bruits.  Abdominal Exam:  Soft with good bowel sounds.  There is no palpable abdominal mass.  There is no fluid wave.  There is no palpable hepatomegaly.  His spleen tip is not palpable with deep inspiration.  Extremities:  Show no clubbing, cyanosis or edema.  Skin Exam:  No rashes, ecchymosis, or petechia.  Neurological Exam:  Shows no focal neurological  deficits.  LABORATORY STUDIES:  White cell count 6.7, hemoglobin 14.3, hematocrit 44.3, platelet count 400,000.  MCV is 77.  IMPRESSION:  [name] is a 56 year old gentleman with polycythemia vera.  He is doing quite well.  He has had no complications from the polycythemia to date.  The Hydrea dose is appropriate for him.  His platelet count is nice and stable.  He does not need to be phlebotomized today.  I do want to make sure that I keep his hematocrit below 45.  I want to see him back in about 6 weeks' time.  At that point in time, we are probably going to have to phlebotomize him.    ______________________________ Josph Macho, M.D. PRE/MEDQ  D:  05/31/2011  T:  06/01/2011  Job:  [id number]

## 2011-06-01 NOTE — Telephone Encounter (Signed)
Mailed 2-27 schedule

## 2011-07-04 ENCOUNTER — Ambulatory Visit (HOSPITAL_BASED_OUTPATIENT_CLINIC_OR_DEPARTMENT_OTHER): Payer: Managed Care, Other (non HMO) | Admitting: Hematology & Oncology

## 2011-07-04 ENCOUNTER — Other Ambulatory Visit (HOSPITAL_BASED_OUTPATIENT_CLINIC_OR_DEPARTMENT_OTHER): Payer: Managed Care, Other (non HMO) | Admitting: Lab

## 2011-07-04 ENCOUNTER — Ambulatory Visit (HOSPITAL_BASED_OUTPATIENT_CLINIC_OR_DEPARTMENT_OTHER): Payer: Managed Care, Other (non HMO)

## 2011-07-04 ENCOUNTER — Telehealth: Payer: Self-pay | Admitting: Hematology & Oncology

## 2011-07-04 VITALS — BP 126/84 | HR 74 | Temp 97.4°F | Ht 70.0 in | Wt 206.0 lb

## 2011-07-04 DIAGNOSIS — D45 Polycythemia vera: Secondary | ICD-10-CM

## 2011-07-04 LAB — CBC WITH DIFFERENTIAL (CANCER CENTER ONLY)
BASO#: 0.2 10*3/uL (ref 0.0–0.2)
BASO%: 3.6 % — ABNORMAL HIGH (ref 0.0–2.0)
EOS%: 3.9 % (ref 0.0–7.0)
Eosinophils Absolute: 0.2 10*3/uL (ref 0.0–0.5)
HCT: 44.8 % (ref 38.7–49.9)
HGB: 15 g/dL (ref 13.0–17.1)
LYMPH#: 1.1 10*3/uL (ref 0.9–3.3)
LYMPH%: 19.2 % (ref 14.0–48.0)
MCH: 27.8 pg — ABNORMAL LOW (ref 28.0–33.4)
MCHC: 33.5 g/dL (ref 32.0–35.9)
MCV: 83 fL (ref 82–98)
MONO#: 0.5 10*3/uL (ref 0.1–0.9)
MONO%: 8.2 % (ref 0.0–13.0)
NEUT#: 3.8 10*3/uL (ref 1.5–6.5)
NEUT%: 65.1 % (ref 40.0–80.0)
Platelets: 201 10*3/uL (ref 145–400)
RBC: 5.39 10*6/uL (ref 4.20–5.70)
RDW: 23.6 % — ABNORMAL HIGH (ref 11.1–15.7)
WBC: 5.8 10*3/uL (ref 4.0–10.0)

## 2011-07-04 NOTE — Telephone Encounter (Signed)
Per patient moved 4-11 to 4-17 due to time needs

## 2011-07-04 NOTE — Progress Notes (Signed)
[name] [name] presents today for phlebotomy per MD orders. Phlebotomy procedure started at 1500 and ended at 1515. 500 grams removed. Patient observed for 30 minutes after procedure without any incident. Patient tolerated procedure well. IV needle removed intact. Last set of vital sign BP-124/81 P-97.4 RR 18 T-97.4

## 2011-07-04 NOTE — Progress Notes (Signed)
This office note has been dictated.

## 2011-07-05 NOTE — Progress Notes (Signed)
CC:   [name] [name]. [name] [name], M.D. [name] [name]. [name] Goodell, MD [name] [name]. [name] Som, MD, [facility]  DIAGNOSIS:  Polycythemia vera, JAK2 positive.  CURRENT THERAPY: 1. Hydrea 500 mg p.o. b.i.d. 2. Aspirin 81 mg p.o. daily. 3. Phlebotomy to maintain hematocrit less than 45%.  INTERIM HISTORY:  [name] [name] comes in for his follow-up.  We last saw him back in January.  He is doing well.  He is getting ready to go down to [facility] to see his daughter who is at [employer] tomorrow.  He has had no headache.  He has had no increase in fatigue or weakness.  He does state an occasional pain over on the left side.  He was last phlebotomized back in November.  He typically gets phlebotomized every 3 or 4 months.  When we last saw him in January, his ferritin was 9.  He has had no headache.  He has had no double vision or blurred vision. He has had no leg swelling.  PHYSICAL EXAMINATION:  General Appearance:  This is a well-developed, well-nourished white gentleman in no obvious distress.  Vital Signs: Show a temperature of 97.4, pulse 59, respiratory rate 18, blood pressure 124/81.  Weight was 206.  Head and Neck Exam:  Shows a normocephalic, atraumatic skull.  There are no ocular or oral lesions. He has no scleral icterus.  There is no adenopathy in his neck.  Lungs: Clear bilaterally.  Cardiac Exam:  Regular rate and rhythm with a normal S1 and S2.  There are no murmurs, rubs or bruits.  Abdominal Exam:  Soft with good bowel sounds.  There is no palpable abdominal mass.  There is no fluid wave.  There may be some slight tenderness in the left upper quadrant to palpation.  There is no splenomegaly.  Extremities:  Show no clubbing, cyanosis or edema.  Skin Exam:  No rashes, ecchymosis, or petechia.  He has a little bit of a ruddy complexion.  Back Exam:  No tenderness over the spine, ribs or hips.  Neurological Exam:  No focal neurological deficits.  LABORATORY STUDIES:  White cell count is 5.8,  hemoglobin 15, hematocrit 44.8, platelet count 201.  IMPRESSION:  [name] [name] is a 56 year old gentleman with polycythemia vera.  He actually is JAK2 positive.  His platelet count is certainly nice and stable.  We will have to monitor his platelet count.  He is on Hydrea twice a day.  We may need to readjust this dose depending on his next platelet count.  We will go ahead and phlebotomize him today.  I will plan to see him back in another 6 weeks.    ______________________________ Josph Macho, M.D. PRE/MEDQ  D:  07/04/2011  T:  07/05/2011  Job:  [id number]

## 2011-08-16 ENCOUNTER — Ambulatory Visit: Payer: Managed Care, Other (non HMO) | Admitting: Hematology & Oncology

## 2011-08-16 ENCOUNTER — Other Ambulatory Visit: Payer: Managed Care, Other (non HMO) | Admitting: Lab

## 2011-08-22 ENCOUNTER — Ambulatory Visit (HOSPITAL_BASED_OUTPATIENT_CLINIC_OR_DEPARTMENT_OTHER): Payer: Managed Care, Other (non HMO) | Admitting: Hematology & Oncology

## 2011-08-22 ENCOUNTER — Ambulatory Visit: Payer: Managed Care, Other (non HMO)

## 2011-08-22 ENCOUNTER — Other Ambulatory Visit (HOSPITAL_BASED_OUTPATIENT_CLINIC_OR_DEPARTMENT_OTHER): Payer: Managed Care, Other (non HMO) | Admitting: Lab

## 2011-08-22 VITALS — BP 118/82 | HR 65 | Temp 96.2°F | Ht 70.0 in | Wt 202.0 lb

## 2011-08-22 DIAGNOSIS — D45 Polycythemia vera: Secondary | ICD-10-CM

## 2011-08-22 DIAGNOSIS — D751 Secondary polycythemia: Secondary | ICD-10-CM

## 2011-08-22 DIAGNOSIS — D509 Iron deficiency anemia, unspecified: Secondary | ICD-10-CM

## 2011-08-22 LAB — FERRITIN: Ferritin: 12 ng/mL — ABNORMAL LOW (ref 22–322)

## 2011-08-22 LAB — CBC WITH DIFFERENTIAL (CANCER CENTER ONLY)
BASO#: 0.2 10*3/uL (ref 0.0–0.2)
BASO%: 4.1 % — ABNORMAL HIGH (ref 0.0–2.0)
EOS%: 3.5 % (ref 0.0–7.0)
Eosinophils Absolute: 0.2 10*3/uL (ref 0.0–0.5)
HCT: 41.9 % (ref 38.7–49.9)
HGB: 14 g/dL (ref 13.0–17.1)
LYMPH#: 0.9 10*3/uL (ref 0.9–3.3)
LYMPH%: 17.2 % (ref 14.0–48.0)
MCH: 30.4 pg (ref 28.0–33.4)
MCHC: 33.4 g/dL (ref 32.0–35.9)
MCV: 91 fL (ref 82–98)
MONO#: 0.5 10*3/uL (ref 0.1–0.9)
MONO%: 9.8 % (ref 0.0–13.0)
NEUT#: 3.6 10*3/uL (ref 1.5–6.5)
NEUT%: 65.4 % (ref 40.0–80.0)
Platelets: 207 10*3/uL (ref 145–400)
RBC: 4.61 10*6/uL (ref 4.20–5.70)
RDW: 17.9 % — ABNORMAL HIGH (ref 11.1–15.7)
WBC: 5.4 10*3/uL (ref 4.0–10.0)

## 2011-08-22 LAB — LACTATE DEHYDROGENASE: LDH: 208 U/L (ref 94–250)

## 2011-08-22 NOTE — Progress Notes (Signed)
This office note has been dictated.

## 2011-08-22 NOTE — Progress Notes (Signed)
No phlebotomy today per Dr Ennever 

## 2011-08-23 NOTE — Progress Notes (Signed)
CC:   [name] [name]. [name], M.D. [name] [name], MD, [facility]  DIAGNOSIS: 1. Polycythemia vera/JAK2 positive.  CURRENT THERAPY: 1. Hydrea 500 mg p.o. b.i.d. 2. Aspirin 81 mg p.o. daily. 3. Phlebotomy to maintain hematocrit less than 45%.  INTERVAL HISTORY:  [name] [name] comes in for his followup.  He is doing well.  He has had no real complaints.  He has noticed some slight tenderness under the arms.  He also states that occasionally at nighttime, his arms "go to sleep."  This seems to be positionally related from my point of view.  His phlebotomy was actually back in November of last year.  His iron studies show that he is clearly iron deficient.  His last ferritin was 9 back in January.  He is working without any difficulties.  He has had no leg swelling.  He has had no rashes.  He has had no dysphagia or odynophagia.  He is on a beta blocker for palpitations.  This has helped.  PHYSICAL EXAMINATION:  General:  This is a well-developed, well- nourished white gentleman in no obvious distress.  Vital Signs: Temperature 96.8, pulse 65, respiratory rate 18, blood pressure 118/82, weight is 202.  Head and Neck Exam:  Shows a normocephalic, atraumatic skull.  There are no ocular or oral lesions.  There are no palpable cervical or supraclavicular lymph nodes.  Lungs:  Clear bilaterally. Cardiac Exam:  Regular rate and rhythm with a normal S1 and S2.  There are no murmurs, rubs, or bruits.  Abdominal Exam:  Soft with good bowel sounds.  There is no palpable abdominal mass.  There is no fluid wave. There is no palpable hepatosplenomegaly.  Back Exam:  No tenderness over the spine, ribs, or hips.  Extremities:  Show no clubbing, cyanosis, or edema.  Neurological Exam:  Shows no focal neurological deficits.  LABORATORY STUDIES:  White cell count 5.4, hemoglobin 14, hematocrit 40, platelet count 207.  MCV is 91.  IMPRESSION:  [name] [name] is a 56 year old gentleman with  polycythemia vera.  He has actually done quite well.  The fact that he has not been phlebotomized now in close to 5 months is remarkable.  I will not change his Hydrea dose.  He is iron deficient, which is helping.  We will plan to get back in 6 weeks' time now.  I do not see that he needs any blood work in between visits.    ______________________________ Josph Macho, M.D. PRE/MEDQ  D:  08/22/2011  T:  08/23/2011  Job:  [id number]

## 2011-08-27 ENCOUNTER — Other Ambulatory Visit: Payer: Self-pay

## 2011-08-27 MED ORDER — PROPRANOLOL HCL ER BEADS 80 MG PO CP24: 80.0000 mg | ORAL_CAPSULE | Freq: Every day | ORAL | Status: DC

## 2011-08-29 ENCOUNTER — Encounter: Payer: Self-pay | Admitting: Internal Medicine

## 2011-08-29 ENCOUNTER — Ambulatory Visit (INDEPENDENT_AMBULATORY_CARE_PROVIDER_SITE_OTHER): Payer: Managed Care, Other (non HMO) | Admitting: Internal Medicine

## 2011-08-29 ENCOUNTER — Telehealth: Payer: Self-pay | Admitting: Internal Medicine

## 2011-08-29 VITALS — BP 120/84 | HR 60 | Ht 72.0 in | Wt 203.0 lb

## 2011-08-29 DIAGNOSIS — K603 Anal fistula: Secondary | ICD-10-CM

## 2011-08-29 DIAGNOSIS — Z86010 Personal history of colonic polyps: Secondary | ICD-10-CM

## 2011-08-29 DIAGNOSIS — K589 Irritable bowel syndrome without diarrhea: Secondary | ICD-10-CM

## 2011-08-29 MED ORDER — METRONIDAZOLE 500 MG PO TABS: 500.0000 mg | ORAL_TABLET | Freq: Two times a day (BID) | ORAL | Status: AC

## 2011-08-29 MED ORDER — CIPROFLOXACIN HCL 500 MG PO TABS: 500.0000 mg | ORAL_TABLET | Freq: Two times a day (BID) | ORAL | Status: AC

## 2011-08-29 NOTE — Telephone Encounter (Signed)
Pt states he is having problems with hemorrhoids/rectal pain. Requesting to be seen today. Scheduled pt to see Dr. [name] today at 2:15pm. Pt aware of appt date and time.

## 2011-08-29 NOTE — Progress Notes (Signed)
HISTORY OF PRESENT ILLNESS:  [name] [name] is a 56 y.o. male with a history of polycythemia vera, curable bowel syndrome, and adenomatous colon polyps. He has undergone complete colonoscopy with intubation of the terminal ileum in September 2007. This was normal except for left-sided diverticulosis. Random biopsies of the colon were normal. He underwent repeat colonoscopy in February 2010 to evaluate rectal bleeding and change in bowel habits. Examination revealed a diminutive transverse colon polyp (adenoma) which was removed, mild diverticulosis, and internal hemorrhoids as the cause for bleeding. He presents today regarding tenderness around the buttock. His problem began about 3 weeks ago he noticed a tender nodule slightly larger than a pea. The pain intensified in detail the nodule ruptured. He has has had purulent drainage. Subsequently developed a similar tender nodule on the opposite buttock near the anus. He self medicated 3 days ago with amoxicillin, which he had on hand for other reasons. He thinks he may have had fever. He continues with alternating bowel habits and abdominal discomfort, but no significant change from years gone by.  REVIEW OF SYSTEMS:  All non-GI ROS negative .  Past Medical History  Diagnosis Date  . Other and unspecified hyperlipidemia   . Diverticulosis of colon (without mention of hemorrhage)   . Polycythemia, secondary   . Nephrolithiasis   . IBS (irritable bowel syndrome)   . Hemorrhoids   . Irregular heartbeat     Past Surgical History  Procedure Date  . Rotator cuff repair     rt.  . Vasectomy   . Retinal detachment surgery     Social History [name] [name]  reports that he has never smoked. He has never used smokeless tobacco. He reports that he drinks alcohol. He reports that he does not use illicit drugs.  family history includes Aortic aneurysm in his father and Heart attack in his brother.  There is no history of Colon cancer.  No Known  Allergies     PHYSICAL EXAMINATION: Vital signs: BP 120/84  Pulse 60  Ht 6' (1.829 m)  Wt 203 lb (92.08 kg)  BMI 27.53 kg/m2  Constitutional: generally well-appearing, no acute distress Psychiatric: alert and oriented x3, cooperative Eyes: extraocular movements intact, anicteric, conjunctiva pink Mouth: oral pharynx moist, no lesions Neck: supple no lymphadenopathy Cardiovascular: heart regular rate and rhythm, no murmur Lungs: clear to auscultation bilaterally Abdomen: soft, nontender, nondistended, no obvious ascites, no peritoneal signs, normal bowel sounds, no organomegaly Rectal:draining perirectal fistula on the right buttock, at 1:00 and slightly tender nondraining nodule on the left buttock, at 9:00 Extremities: no lower extremity edema bilaterally Skin: no lesions on visible extremities Neuro: No focal deficits.   ASSESSMENT:  #1. Perirectal fistula, draining. As well, nondraining slightly tender nodule. #2. Irritable bowel syndrome #3. History of adenomatous colon polyps #4. Colonoscopy in 2007 and again in 2010 without evidence of Crohn's disease macroscopically were microscopically   PLAN:  #1. Prescribed ciprofloxacin 500 mg by mouth twice a day x2 weeks #2. Prescribed metronidazole 500 mg by mouth twice a day x2 weeks. Avoid alcohol #3. Sitz baths twice a day #4. Office followup in 3-4 weeks #5. If symptoms worsen in the interim, general surgical referral and/or ER evaluation (if general surgeon not readily available for outpatient evaluation and consultation). He understands

## 2011-08-29 NOTE — Patient Instructions (Addendum)
We have sent the following medications to your pharmacy for you to pick up at your convenience:  cipro and flagyl  You may take sitz baths twice a day  Please follow up in 3-4 weeks

## 2011-09-25 ENCOUNTER — Encounter: Payer: Self-pay | Admitting: Internal Medicine

## 2011-09-25 ENCOUNTER — Ambulatory Visit (INDEPENDENT_AMBULATORY_CARE_PROVIDER_SITE_OTHER): Payer: Managed Care, Other (non HMO) | Admitting: Internal Medicine

## 2011-09-25 VITALS — BP 118/70 | HR 78 | Ht 72.0 in | Wt 199.0 lb

## 2011-09-25 DIAGNOSIS — K625 Hemorrhage of anus and rectum: Secondary | ICD-10-CM

## 2011-09-25 DIAGNOSIS — K648 Other hemorrhoids: Secondary | ICD-10-CM

## 2011-09-25 DIAGNOSIS — K603 Anal fistula: Secondary | ICD-10-CM

## 2011-09-25 DIAGNOSIS — K604 Rectal fistula: Secondary | ICD-10-CM

## 2011-09-25 DIAGNOSIS — K5900 Constipation, unspecified: Secondary | ICD-10-CM

## 2011-09-25 DIAGNOSIS — K589 Irritable bowel syndrome without diarrhea: Secondary | ICD-10-CM

## 2011-09-25 NOTE — Progress Notes (Signed)
HISTORY OF PRESENT ILLNESS:  [name] [name] is a 56 y.o. male with a history of polycythemia vera, irritable bowel syndrome, and adenomatous colon polyps. He was last seen 08/29/2011 regarding a draining perirectal fistula and non-draining tender perirectal nodule. See that dictation. He was treated with antibiotics and sitz baths therapies. He presents today for followup. The patient reports, at about 5 days into his treatment, his symptoms resolved. He has been asymptomatic since. He does mention a tendency toward constipation and some minor rectal bleeding. He is known to have hemorrhoids on his most recent colonoscopy.  REVIEW OF SYSTEMS:  All non-GI ROS negative your patient report   Past Medical History  Diagnosis Date  . Other and unspecified hyperlipidemia   . Diverticulosis of colon (without mention of hemorrhage)   . Polycythemia, secondary   . Nephrolithiasis   . IBS (irritable bowel syndrome)   . Hemorrhoids   . Irregular heartbeat     Past Surgical History  Procedure Date  . Rotator cuff repair     rt.  . Vasectomy   . Retinal detachment surgery     Social History [name]  reports that he has never smoked. He has never used smokeless tobacco. He reports that he drinks alcohol. He reports that he does not use illicit drugs.  family history includes Aortic aneurysm in his father and Heart attack in his brother.  There is no history of Colon cancer.  No Known Allergies     PHYSICAL EXAMINATION: Vital signs: BP 118/70  Pulse 78  Ht 6' (1.829 m)  Wt 199 lb (90.266 kg)  BMI 26.99 kg/m2 General: Well-developed, well-nourished, no acute distress Abdomen: not reexamined Rectal: Complete healing of perirectal area Psychiatric: alert and oriented x3. Cooperative      ASSESSMENT:  #1. Perirectal fistula with drainage. Resolved with medical therapies #2. Irritable bowel syndrome #3. History of adenomatous colon polyps. Previous colonoscopies 2007 and again  in 2010 #4. Mild constipation with minor rectal bleeding due to known hemorrhoids   PLAN:  #1. Increase fiber #2. Surveillance colonoscopy around 2015. Interval followup as needed

## 2011-09-25 NOTE — Patient Instructions (Signed)
Please follow up as needed 

## 2011-10-02 ENCOUNTER — Other Ambulatory Visit: Payer: Self-pay | Admitting: *Deleted

## 2011-10-02 MED ORDER — PROPRANOLOL HCL ER BEADS 80 MG PO CP24: 80.0000 mg | ORAL_CAPSULE | Freq: Every day | ORAL | Status: DC

## 2011-10-03 ENCOUNTER — Other Ambulatory Visit (HOSPITAL_BASED_OUTPATIENT_CLINIC_OR_DEPARTMENT_OTHER): Payer: Managed Care, Other (non HMO) | Admitting: Lab

## 2011-10-03 ENCOUNTER — Ambulatory Visit (HOSPITAL_BASED_OUTPATIENT_CLINIC_OR_DEPARTMENT_OTHER): Payer: Managed Care, Other (non HMO) | Admitting: Hematology & Oncology

## 2011-10-03 ENCOUNTER — Other Ambulatory Visit: Payer: Self-pay | Admitting: *Deleted

## 2011-10-03 ENCOUNTER — Ambulatory Visit (HOSPITAL_BASED_OUTPATIENT_CLINIC_OR_DEPARTMENT_OTHER): Payer: Managed Care, Other (non HMO)

## 2011-10-03 VITALS — BP 112/71 | HR 72 | Temp 97.0°F | Ht 71.0 in | Wt 199.0 lb

## 2011-10-03 VITALS — BP 112/78 | HR 78 | Temp 97.4°F

## 2011-10-03 DIAGNOSIS — D751 Secondary polycythemia: Secondary | ICD-10-CM

## 2011-10-03 DIAGNOSIS — D45 Polycythemia vera: Secondary | ICD-10-CM

## 2011-10-03 LAB — CBC WITH DIFFERENTIAL (CANCER CENTER ONLY)
BASO#: 0.2 10*3/uL (ref 0.0–0.2)
BASO%: 2.6 % — ABNORMAL HIGH (ref 0.0–2.0)
EOS%: 2.9 % (ref 0.0–7.0)
Eosinophils Absolute: 0.2 10*3/uL (ref 0.0–0.5)
HCT: 44.7 % (ref 38.7–49.9)
HGB: 14.9 g/dL (ref 13.0–17.1)
LYMPH#: 0.9 10*3/uL (ref 0.9–3.3)
LYMPH%: 11.7 % — ABNORMAL LOW (ref 14.0–48.0)
MCH: 30.5 pg (ref 28.0–33.4)
MCHC: 33.3 g/dL (ref 32.0–35.9)
MCV: 91 fL (ref 82–98)
MONO#: 0.6 10*3/uL (ref 0.1–0.9)
MONO%: 8.6 % (ref 0.0–13.0)
NEUT#: 5.5 10*3/uL (ref 1.5–6.5)
NEUT%: 74.2 % (ref 40.0–80.0)
Platelets: 334 10*3/uL (ref 145–400)
RBC: 4.89 10*6/uL (ref 4.20–5.70)
RDW: 16.1 % — ABNORMAL HIGH (ref 11.1–15.7)
WBC: 7.4 10*3/uL (ref 4.0–10.0)

## 2011-10-03 NOTE — Progress Notes (Signed)
This office note has been dictated.

## 2011-10-03 NOTE — Progress Notes (Signed)
[name] [name] presents today for phlebotomy per MD orders. Phlebotomy procedure started at 1640 and ended at 1700.  removed. Patient observed for 30 minutes after procedure without any incident. Patient tolerated procedure well. IV needle removed intact.

## 2011-10-04 NOTE — Progress Notes (Signed)
CC:   Barry Dienes. [name], M.D. [name] [name], MD, [facility]  DIAGNOSIS:  Polycythemia vera, JAK2-positive.  CURRENT THERAPY: 1. Hydrea 500 mg p.o. b.i.d. 2. Aspirin 81 mg p.o. daily. 3. Phlebotomy to maintain hematocrit less than 45%.  INTERIM HISTORY:  Mr. Weida comes in for followup.  He is doing okay. He has had no real complaints.  He had a good Memorial Day weekend.  His last phlebotomy was back in February.  He typically gets phlebotomized every 3 or 4 months.  His last ferritin back in April was 12.  He has had some intermittent numbness in his arms when he sleeps.  He said that this does not happen every time.  I am not sure exactly what is going on with this.  He is working without difficulties.  He has had no headache.  He has had no abdominal pain.  He has had maybe a couple of palpitations.  He is on a beta-blocker for this.  He has not noticed any change in bowel or bladder habits.  There has been no leg swelling.  PHYSICAL EXAMINATION:  This is a well-developed, well-nourished white gentleman in no obvious distress.  vital signs:  Temperature of 97, pulse 72, respiratory rate 18, blood pressure 112/71.  Weight is 199. Head and neck:  A normocephalic, atraumatic skull.  There are no ocular or oral lesions.  There are no palpable cervical or supraclavicular lymph nodes.  Lungs:  Clear to percussion and auscultation bilaterally. Cardiac:  Regular rate and rhythm with a normal S1 and S2.  There are no murmurs, rubs, or bruits.  Abdomen:  Soft with good bowel sounds.  There is no palpable abdominal mass.  There is no fluid wave.  There is no guarding or rebound tenderness.  There is no palpable hepatosplenomegaly.  Back:  No tenderness over the spine, ribs, or hips. Extremities:  No clubbing, cyanosis or edema.  He has good range of motion of his joints.  Neurologic:  No focal neurological deficits.  LABORATORY STUDIES:  White cell count is 7.2, hemoglobin 15,  hematocrit 45, platelet count 334.  MCV is 91.  IMPRESSION:  Mr. Garriga is a 56 year old gentleman with JAK2-positive polycythemia.  He is doing okay with this.  He has had no complications from this.  We will go ahead and phlebotomize him today.  I think if we do this, we can give get him through all of summer without having to come back.  He is okay with this.  He is still taking the aspirin, which I think is critical for him.  I am still not sure what's going on with his arms.  Again, I think this might be something that is positional and not anything related to his polycythemia or "hyperviscosity."  I will see Mr. Mccord back after Labor Day.  He certainly can come in anytime beforehand if he feels that he needs to have his blood checked.    ______________________________ Josph Macho, M.D. PRE/MEDQ  D:  10/03/2011  T:  10/04/2011  Job:  [id number]

## 2011-10-19 ENCOUNTER — Other Ambulatory Visit: Payer: Self-pay

## 2011-11-22 ENCOUNTER — Other Ambulatory Visit: Payer: Self-pay | Admitting: *Deleted

## 2011-11-22 MED ORDER — PROPRANOLOL HCL ER BEADS 80 MG PO CP24: 80.0000 mg | ORAL_CAPSULE | Freq: Every day | ORAL | Status: DC

## 2011-11-22 NOTE — Telephone Encounter (Signed)
Refilled propranolol one time only

## 2011-12-24 ENCOUNTER — Other Ambulatory Visit: Payer: Self-pay | Admitting: Cardiology

## 2012-01-16 ENCOUNTER — Other Ambulatory Visit (HOSPITAL_BASED_OUTPATIENT_CLINIC_OR_DEPARTMENT_OTHER): Payer: Managed Care, Other (non HMO) | Admitting: Lab

## 2012-01-16 ENCOUNTER — Ambulatory Visit (HOSPITAL_BASED_OUTPATIENT_CLINIC_OR_DEPARTMENT_OTHER): Payer: Managed Care, Other (non HMO) | Admitting: Hematology & Oncology

## 2012-01-16 ENCOUNTER — Ambulatory Visit (HOSPITAL_BASED_OUTPATIENT_CLINIC_OR_DEPARTMENT_OTHER): Payer: Managed Care, Other (non HMO)

## 2012-01-16 VITALS — BP 135/91 | HR 62 | Temp 98.0°F | Resp 20 | Ht 71.0 in | Wt 196.0 lb

## 2012-01-16 DIAGNOSIS — D45 Polycythemia vera: Secondary | ICD-10-CM

## 2012-01-16 DIAGNOSIS — L738 Other specified follicular disorders: Secondary | ICD-10-CM

## 2012-01-16 DIAGNOSIS — L678 Other hair color and hair shaft abnormalities: Secondary | ICD-10-CM

## 2012-01-16 DIAGNOSIS — L739 Follicular disorder, unspecified: Secondary | ICD-10-CM

## 2012-01-16 DIAGNOSIS — D751 Secondary polycythemia: Secondary | ICD-10-CM

## 2012-01-16 LAB — COMPREHENSIVE METABOLIC PANEL
ALT: 15 U/L (ref 0–53)
AST: 18 U/L (ref 0–37)
Albumin: 4.7 g/dL (ref 3.5–5.2)
Alkaline Phosphatase: 51 U/L (ref 39–117)
BUN: 14 mg/dL (ref 6–23)
CO2: 26 mEq/L (ref 19–32)
Calcium: 9 mg/dL (ref 8.4–10.5)
Chloride: 103 mEq/L (ref 96–112)
Creatinine, Ser: 0.87 mg/dL (ref 0.50–1.35)
Glucose, Bld: 78 mg/dL (ref 70–99)
Potassium: 4 mEq/L (ref 3.5–5.3)
Sodium: 138 mEq/L (ref 135–145)
Total Bilirubin: 0.9 mg/dL (ref 0.3–1.2)
Total Protein: 6.5 g/dL (ref 6.0–8.3)

## 2012-01-16 LAB — CBC WITH DIFFERENTIAL (CANCER CENTER ONLY)
BASO#: 0.2 10*3/uL (ref 0.0–0.2)
BASO%: 3 % — ABNORMAL HIGH (ref 0.0–2.0)
EOS%: 2.4 % (ref 0.0–7.0)
Eosinophils Absolute: 0.2 10*3/uL (ref 0.0–0.5)
HCT: 44.5 % (ref 38.7–49.9)
HGB: 15.4 g/dL (ref 13.0–17.1)
LYMPH#: 1 10*3/uL (ref 0.9–3.3)
LYMPH%: 15.2 % (ref 14.0–48.0)
MCH: 31.7 pg (ref 28.0–33.4)
MCHC: 34.6 g/dL (ref 32.0–35.9)
MCV: 92 fL (ref 82–98)
MONO#: 0.6 10*3/uL (ref 0.1–0.9)
MONO%: 9.5 % (ref 0.0–13.0)
NEUT#: 4.4 10*3/uL (ref 1.5–6.5)
NEUT%: 69.9 % (ref 40.0–80.0)
Platelets: 279 10*3/uL (ref 145–400)
RBC: 4.86 10*6/uL (ref 4.20–5.70)
RDW: 17.4 % — ABNORMAL HIGH (ref 11.1–15.7)
WBC: 6.3 10*3/uL (ref 4.0–10.0)

## 2012-01-16 LAB — LIPID PANEL
Cholesterol: 166 mg/dL (ref 0–200)
HDL: 32 mg/dL — ABNORMAL LOW (ref >39)
LDL Cholesterol: 73 mg/dL (ref 0–99)
Total CHOL/HDL Ratio: 5.2 Ratio
Triglycerides: 303 mg/dL — ABNORMAL HIGH (ref <150)
VLDL: 61 mg/dL — ABNORMAL HIGH (ref 0–40)

## 2012-01-16 MED ORDER — DOXYCYCLINE HYCLATE 100 MG PO TABS: 100.0000 mg | ORAL_TABLET | Freq: Two times a day (BID) | ORAL | Status: AC

## 2012-01-16 NOTE — Patient Instructions (Signed)
Call if probleems

## 2012-01-16 NOTE — Progress Notes (Signed)
This office note has been dictated.

## 2012-01-17 NOTE — Progress Notes (Signed)
CC:   [name] [name]. [name], M.D. [name]. [name], MD [name]. [name], MD, [facility]  DIAGNOSIS:  Polycythemia vera, JAK2 positive.  CURRENT THERAPY: 1. Hydrea 500 mg p.o. b.i.d. 2. Aspirin 81 mg p.o. daily. 3. Phlebotomy to keep hematocrit below 45%.  INTERIM HISTORY:  [name] [name] comes in for his followup.  He is doing okay.  He seems to have developed some folliculitis on his gluteal area. He also has a tiny carbuncle under the right axilla.  I will go ahead and put him on some doxycycline for this.  He was phlebotomized when we saw him back in May.  He has had a good summer.  He and his wife were up in [place] for 10 days. They really enjoyed themselves.  He has had no other real issues.  He has had no nausea or vomiting.  He has had no headache.  We are keeping him iron deficient.  Over the last year, his ferritin was 12.  He has not noted any problems with leg swelling.  He has had no fever.  PHYSICAL EXAMINATION:  This is a well-developed, well-nourished white gentleman in no obvious distress.  Vital signs:  Temperature of 98.2, pulse 62, respiratory rate 20, blood pressure 135/91.  Weight is 196. Head and neck:  Normocephalic, atraumatic skull.  There are no ocular or oral lesions. There are no palpable cervical or supraclavicular lymph nodes.  Lungs:  Clear bilaterally.  Cardiac:  Regular rate and rhythm with a normal S1, S2.  There are no murmurs, rubs or bruits.  Axillary exam does show the carbuncle in the right axilla.  Abdomen:  Soft with good bowel sounds.  His spleen tip is palpable with deep inspiration. There is no palpable hepatomegaly.  Back:  No tenderness of the spine, ribs, or hips.  Gluteal exam does show some areas of folliculitis on bilateral gluteal regions.  Extremities:  No clubbing, cyanosis or edema.  Neurologic:  No focal neurological deficits.  LABORATORY STUDIES:  White cell count 6.3, hemoglobin 15.4, hematocrit 44.5, platelet count 279.  MCV is  92.  IMPRESSION:  [name] [name] is a 56 year old gentleman with polycythemia. We will go ahead and phlebotomize him today.  I like to make sure that we keep his hematocrit below 45%.  I am not sure why he has developed this folliculitis.  His immune system should be intact.  He is on Hydrea, but this really has not affected his immunity from my point of view.  Again, we will put him on some doxycycline p.o., have him take it for 10 days.  We will plan to get him back in another 3 months for followup.    ______________________________ Josph Macho, M.D. PRE/MEDQ  D:  01/16/2012  T:  01/17/2012  Job:  [id number]

## 2012-01-21 ENCOUNTER — Telehealth: Payer: Self-pay | Admitting: *Deleted

## 2012-01-21 NOTE — Telephone Encounter (Signed)
Labs faxed to Dr. Dossie Arbour at [phone]. Per dr. Myna Hidalgo request

## 2012-01-30 ENCOUNTER — Encounter: Payer: Self-pay | Admitting: Cardiology

## 2012-01-30 ENCOUNTER — Ambulatory Visit (INDEPENDENT_AMBULATORY_CARE_PROVIDER_SITE_OTHER): Payer: Managed Care, Other (non HMO) | Admitting: Cardiology

## 2012-01-30 VITALS — BP 130/82 | HR 58 | Ht 72.0 in | Wt 195.0 lb

## 2012-01-30 DIAGNOSIS — R002 Palpitations: Secondary | ICD-10-CM

## 2012-01-30 MED ORDER — PROPRANOLOL HCL ER 80 MG PO CP24: 80.0000 mg | ORAL_CAPSULE | Freq: Every day | ORAL | Status: DC

## 2012-01-30 NOTE — Patient Instructions (Addendum)
Your physician recommends that you schedule a follow-up appointment in: AS NEEDED  

## 2012-01-30 NOTE — Progress Notes (Signed)
HPI: Pleasant male I previously saw in May of 2011 for evaluation of chest pain and palpitations. Stress echocardiogram in June of 2011 was normal. A TSH was normal. A CardioNet revealed sinus rhythm with occasional PACs and PVCs.  I last saw him in Jan 2012. We added beta blocker for palpitations at that time. Since then, the patient denies any dyspnea on exertion, orthopnea, PND, pedal edema, syncope or chest pain. Occasional brief palpitation but much improved on beta blocker.   Current Outpatient Prescriptions  Medication Sig Dispense Refill  . acetaminophen (TYLENOL) 500 MG tablet Take 1,000 mg by mouth every 6 (six) hours as needed. For pain       . alfuzosin (UROXATRAL) 10 MG 24 hr tablet Take 10 mg by mouth every other day.       Marland Kitchen aspirin 81 MG tablet Take 81 mg by mouth daily.        . DiphenhydrAMINE HCl (ZZZQUIL) 50 MG/30ML LIQD Take by mouth as needed.       . diphenoxylate-atropine (LOMOTIL) 2.5-0.025 MG per tablet Take 1 tablet by mouth 4 (four) times daily as needed. For diarrhea      . finasteride (PROSCAR) 5 MG tablet 5 mg every other day.       . hydroxyurea (HYDREA) 500 MG capsule Take 500 mg by mouth 2 (two) times daily. May take with food to minimize GI side effects.      . hyoscyamine (LEVBID) 0.375 MG 12 hr tablet as needed.       . propranolol (INDERAL) 10 MG tablet Take 10 mg by mouth as needed.      . propranolol ER (INDERAL LA) 80 MG 24 hr capsule TAKE 1 CAPSULE (80 MG TOTAL) BY MOUTH AT BEDTIME.  30 capsule  0  . zolpidem (AMBIEN CR) 12.5 MG CR tablet Take by mouth as needed. For sleep pt usually takes 1/2 tablet      . DISCONTD: propranolol (INNOPRAN XL) 80 MG 24 hr capsule Take 1 capsule (80 mg total) by mouth at bedtime.  30 capsule  0     Past Medical History  Diagnosis Date  . Other and unspecified hyperlipidemia   . Diverticulosis of colon (without mention of hemorrhage)   . Polycythemia, secondary   . Nephrolithiasis   . IBS (irritable bowel syndrome)    . Hemorrhoids   . Irregular heartbeat     Past Surgical History  Procedure Date  . Rotator cuff repair     rt.  . Vasectomy   . Retinal detachment surgery     History   Social History  . Marital Status: Married    Spouse Name: N/A    Number of Children: 2  . Years of Education: N/A   Occupational History  . ENGINEER [employer]   Social History Main Topics  . Smoking status: Never Smoker   . Smokeless tobacco: Never Used  . Alcohol Use: Yes     3-4 beers a week  . Drug Use: No  . Sexually Active: Not on file   Other Topics Concern  . Not on file   Social History Narrative  . No narrative on file    ROS: no fevers or chills, productive cough, hemoptysis, dysphasia, odynophagia, melena, hematochezia, dysuria, hematuria, rash, seizure activity, orthopnea, PND, pedal edema, claudication. Remaining systems are negative.  Physical Exam: Well-developed well-nourished in no acute distress.  Skin is warm and dry.  HEENT is normal.  Neck is supple.  Chest is clear to auscultation with normal expansion.  Cardiovascular exam is regular rate and rhythm.  Abdominal exam nontender or distended. No masses palpated. Extremities show no edema. neuro grossly intact  ECG sinus bradycardia at a rate of 58. Incomplete right bundle branch block. No ST changes.

## 2012-01-30 NOTE — Assessment & Plan Note (Signed)
Previous functional study negative. No further symptoms.

## 2012-01-30 NOTE — Assessment & Plan Note (Signed)
Management per primary care. 

## 2012-01-30 NOTE — Assessment & Plan Note (Signed)
Previous monitor showed PACs and PVCs. His symptoms are markedly improved on beta-blockade. Will continue. LV function is normal.

## 2012-02-28 ENCOUNTER — Other Ambulatory Visit: Payer: Self-pay | Admitting: Hematology & Oncology

## 2012-03-23 ENCOUNTER — Other Ambulatory Visit: Payer: Self-pay | Admitting: Internal Medicine

## 2012-03-26 ENCOUNTER — Other Ambulatory Visit: Payer: Self-pay | Admitting: Internal Medicine

## 2012-03-28 NOTE — Telephone Encounter (Signed)
See previous note

## 2012-04-16 ENCOUNTER — Other Ambulatory Visit (HOSPITAL_BASED_OUTPATIENT_CLINIC_OR_DEPARTMENT_OTHER): Payer: Managed Care, Other (non HMO) | Admitting: Lab

## 2012-04-16 ENCOUNTER — Ambulatory Visit (HOSPITAL_BASED_OUTPATIENT_CLINIC_OR_DEPARTMENT_OTHER): Payer: Managed Care, Other (non HMO)

## 2012-04-16 ENCOUNTER — Ambulatory Visit (HOSPITAL_BASED_OUTPATIENT_CLINIC_OR_DEPARTMENT_OTHER): Payer: Managed Care, Other (non HMO) | Admitting: Medical

## 2012-04-16 VITALS — BP 123/80 | HR 75 | Temp 98.3°F | Resp 16 | Wt 199.0 lb

## 2012-04-16 VITALS — BP 129/87 | HR 66 | Resp 20

## 2012-04-16 DIAGNOSIS — D45 Polycythemia vera: Secondary | ICD-10-CM

## 2012-04-16 DIAGNOSIS — D751 Secondary polycythemia: Secondary | ICD-10-CM

## 2012-04-16 LAB — CBC WITH DIFFERENTIAL (CANCER CENTER ONLY)
BASO#: 0.2 10*3/uL (ref 0.0–0.2)
BASO%: 3.4 % — ABNORMAL HIGH (ref 0.0–2.0)
EOS%: 2.5 % (ref 0.0–7.0)
Eosinophils Absolute: 0.1 10*3/uL (ref 0.0–0.5)
HCT: 44.5 % (ref 38.7–49.9)
HGB: 15.5 g/dL (ref 13.0–17.1)
LYMPH#: 0.8 10*3/uL — ABNORMAL LOW (ref 0.9–3.3)
LYMPH%: 14.6 % (ref 14.0–48.0)
MCH: 34.6 pg — ABNORMAL HIGH (ref 28.0–33.4)
MCHC: 34.8 g/dL (ref 32.0–35.9)
MCV: 99 fL — ABNORMAL HIGH (ref 82–98)
MONO#: 0.6 10*3/uL (ref 0.1–0.9)
MONO%: 10.3 % (ref 0.0–13.0)
NEUT#: 3.8 10*3/uL (ref 1.5–6.5)
NEUT%: 69.2 % (ref 40.0–80.0)
Platelets: 262 10*3/uL (ref 145–400)
RBC: 4.48 10*6/uL (ref 4.20–5.70)
RDW: 16.1 % — ABNORMAL HIGH (ref 11.1–15.7)
WBC: 5.6 10*3/uL (ref 4.0–10.0)

## 2012-04-16 LAB — COMPREHENSIVE METABOLIC PANEL
ALT: 27 U/L (ref 0–53)
AST: 24 U/L (ref 0–37)
Albumin: 5 g/dL (ref 3.5–5.2)
Alkaline Phosphatase: 66 U/L (ref 39–117)
BUN: 16 mg/dL (ref 6–23)
CO2: 22 mEq/L (ref 19–32)
Calcium: 9.4 mg/dL (ref 8.4–10.5)
Chloride: 107 mEq/L (ref 96–112)
Creatinine, Ser: 0.88 mg/dL (ref 0.50–1.35)
Glucose, Bld: 103 mg/dL — ABNORMAL HIGH (ref 70–99)
Potassium: 4.6 mEq/L (ref 3.5–5.3)
Sodium: 142 mEq/L (ref 135–145)
Total Bilirubin: 1 mg/dL (ref 0.3–1.2)
Total Protein: 6.5 g/dL (ref 6.0–8.3)

## 2012-04-16 LAB — FERRITIN: Ferritin: 18 ng/mL — ABNORMAL LOW (ref 22–322)

## 2012-04-16 LAB — CHCC SATELLITE - SMEAR

## 2012-04-16 NOTE — Patient Instructions (Signed)

## 2012-04-16 NOTE — Progress Notes (Signed)
[name] [name] presents today for phlebotomy per MD orders. Phlebotomy procedure started at 1530 and ended at 1550. Had to cannulate x 2 d/t needle clotting. Approximately 500 mls removed. Patient observed for 30 minutes after procedure without any incident. Patient tolerated procedure well. IV needle removed intact. Teola Bradley, Averey Koning [employer]

## 2012-04-16 NOTE — Progress Notes (Signed)
Diagnosis: Polycythemia vera, JAK 2 positive.  Current therapy: #1 Hydrea, 500 mg by mouth twice a day. #2 aspirin 81 mg by mouth daily. #3 phlebotomy to keep, hematocrit below 45%.  Interim history: [name] [name] presents today for an office followup visit.  [name] [name], reports, that he is a little bit more sluggish than his normal.  He states, that he rather have 6-8 weeks, office visits, instead of every 3 months.  He feels, that if he is phlebotomized more often.  He will be less sluggish.  His hemoglobin today is 44.5.  He will receive a phlebotomy.  We are keeping him.  Iron deficient.  His last ferritin in April, was 12.  He does not report any new problems.  He's not reporting any nausea, vomiting, he's not reporting any visual changes, or rashes.  He does have a an occasional headache.  He denies any fevers, chills, or night sweats.  He denies any cough, chest pain, or shortness of breath.  He's not noted any problems with leg swelling.  He denies any obvious, or abnormal bleeding.  He denies any recent or chronic infections.  Review of Systems: Constitutional:Negative for malaise/fatigue, fever, chills, weight loss, diaphoresis, activity change, appetite change, and unexpected weight change.  HEENT: Negative for double vision, blurred vision, visual loss, ear pain, tinnitus, congestion, rhinorrhea, epistaxis sore throat or sinus disease, oral pain/lesion, tongue soreness Respiratory: Negative for cough, chest tightness, shortness of breath, wheezing and stridor.  Cardiovascular: Negative for chest pain, palpitations, leg swelling, orthopnea, PND, DOE or claudication Gastrointestinal: Negative for nausea, vomiting, abdominal pain, diarrhea, constipation, blood in stool, melena, hematochezia, abdominal distention, anal bleeding, rectal pain, anorexia and hematemesis.  Genitourinary: Negative for dysuria, frequency, hematuria,  Musculoskeletal: Negative for myalgias, back pain, joint  swelling, arthralgias and gait problem.  Skin: Negative for rash, color change, pallor and wound.  Neurological:. Negative for dizziness/light-headedness, tremors, seizures, syncope, facial asymmetry, speech difficulty, weakness, numbness, headaches and paresthesias.  Hematological: Negative for adenopathy. Does not bruise/bleed easily.  Psychiatric/Behavioral:  Negative for depression, no loss of interest in normal activity or change in sleep pattern.   Physical Exam:  This is a pleasant, 56 year old, well-developed, well-nourished, white gentleman, in no obvious distress Vitals:, temperature 90.3 degrees, pulse 75, respirations 16, blood pressure 123.  Bradycardia, weight 199 pounds  HEENT reveals a normocephalic, atraumatic skull, no scleral icterus, no oral lesions  Neck is supple without any cervical or supraclavicular adenopathy.  Lungs are clear to auscultation bilaterally. There are no wheezes, rales or rhonci Cardiac is regular rate and rhythm with a normal S1 and S2. There are no murmurs, rubs, or bruits.  Abdomen is soft with good bowel sounds, there is no palpable mass. There is no palpable hepatosplenomegaly. There is no palpable fluid wave.  Musculoskeletal no tenderness of the spine, ribs, or hips.  Extremities there are no clubbing, cyanosis, or edema.  Skin no petechia, purpura or ecchymosis Neurologic is nonfocal.  Laboratory Data:  white count 5.6, hemoglobin 15.5, hematocrit 44.5.  Platelets 262,000   Current Outpatient Prescriptions on File Prior to Visit  Medication Sig Dispense Refill  . acetaminophen (TYLENOL) 500 MG tablet Take 1,000 mg by mouth every 6 (six) hours as needed. For pain       . alfuzosin (UROXATRAL) 10 MG 24 hr tablet Take 10 mg by mouth every other day.       Marland Kitchen aspirin 81 MG tablet Take 81 mg by mouth daily.        Marland Kitchen  clidinium-chlordiazePOXIDE (LIBRAX) 2.5-5 MG per capsule TAKE 1 CAPSULE BY MOUTH 3 (THREE) TIMES DAILY BEFORE MEALS.  100 capsule  2   . DiphenhydrAMINE HCl (ZZZQUIL) 50 MG/30ML LIQD Take by mouth as needed.       . diphenoxylate-atropine (LOMOTIL) 2.5-0.025 MG per tablet Take 1 tablet by mouth 4 (four) times daily as needed. For diarrhea      . finasteride (PROSCAR) 5 MG tablet 5 mg every other day.       . hydroxyurea (HYDREA) 500 MG capsule TAKE 2 CAPSULES BY MOUTH EVERY DAY  60 capsule  9  . hyoscyamine (LEVBID) 0.375 MG 12 hr tablet as needed.       . propranolol (INDERAL) 10 MG tablet Take 10 mg by mouth as needed.      . propranolol ER (INDERAL LA) 80 MG 24 hr capsule Take 1 capsule (80 mg total) by mouth daily.  30 capsule  12  . zolpidem (AMBIEN CR) 12.5 MG CR tablet Take by mouth as needed. For sleep pt usually takes 1/2 tablet      . [DISCONTINUED] propranolol (INNOPRAN XL) 80 MG 24 hr capsule Take 1 capsule (80 mg total) by mouth at bedtime.  30 capsule  0   Assessment/Plan: This is a pleasant, 56 year old, white gentleman, with the following issues:  #1 polycythemia vera. He is JAK 2 positive.  He will go ahead and get phlebotomized today.  He would like to be seen regularly on a 6-8 weeks.  Schedule instead of every 3 month.  He seems to be more symptomatic past the eight week period.  He will remain on his Hydrea, and aspirin.  #2.Follow.  [name] [name] follow back up with [place] in 6-8 weeks, but before then should there be questions or concerns.

## 2012-05-29 ENCOUNTER — Ambulatory Visit: Payer: Managed Care, Other (non HMO)

## 2012-05-29 ENCOUNTER — Other Ambulatory Visit (HOSPITAL_BASED_OUTPATIENT_CLINIC_OR_DEPARTMENT_OTHER): Payer: Managed Care, Other (non HMO) | Admitting: Lab

## 2012-05-29 ENCOUNTER — Ambulatory Visit (HOSPITAL_BASED_OUTPATIENT_CLINIC_OR_DEPARTMENT_OTHER): Payer: Managed Care, Other (non HMO) | Admitting: Hematology & Oncology

## 2012-05-29 ENCOUNTER — Other Ambulatory Visit: Payer: Managed Care, Other (non HMO) | Admitting: Lab

## 2012-05-29 ENCOUNTER — Ambulatory Visit: Payer: Managed Care, Other (non HMO) | Admitting: Hematology & Oncology

## 2012-05-29 VITALS — BP 141/82 | HR 56 | Temp 98.1°F | Resp 18 | Ht 72.0 in | Wt 201.0 lb

## 2012-05-29 DIAGNOSIS — D45 Polycythemia vera: Secondary | ICD-10-CM

## 2012-05-29 DIAGNOSIS — D751 Secondary polycythemia: Secondary | ICD-10-CM

## 2012-05-29 LAB — CBC WITH DIFFERENTIAL (CANCER CENTER ONLY)
BASO#: 0.2 10*3/uL (ref 0.0–0.2)
BASO%: 2.8 % — ABNORMAL HIGH (ref 0.0–2.0)
EOS%: 2.6 % (ref 0.0–7.0)
Eosinophils Absolute: 0.2 10*3/uL (ref 0.0–0.5)
HCT: 44.3 % (ref 38.7–49.9)
HGB: 15.2 g/dL (ref 13.0–17.1)
LYMPH#: 0.9 10*3/uL (ref 0.9–3.3)
LYMPH%: 15.9 % (ref 14.0–48.0)
MCH: 33.3 pg (ref 28.0–33.4)
MCHC: 34.3 g/dL (ref 32.0–35.9)
MCV: 97 fL (ref 82–98)
MONO#: 0.7 10*3/uL (ref 0.1–0.9)
MONO%: 11.6 % (ref 0.0–13.0)
NEUT#: 3.9 10*3/uL (ref 1.5–6.5)
NEUT%: 67.1 % (ref 40.0–80.0)
Platelets: 355 10*3/uL (ref 145–400)
RBC: 4.56 10*6/uL (ref 4.20–5.70)
RDW: 14.9 % (ref 11.1–15.7)
WBC: 5.8 10*3/uL (ref 4.0–10.0)

## 2012-05-29 LAB — COMPREHENSIVE METABOLIC PANEL
ALT: 18 U/L (ref 0–53)
AST: 22 U/L (ref 0–37)
Albumin: 4.8 g/dL (ref 3.5–5.2)
Alkaline Phosphatase: 53 U/L (ref 39–117)
BUN: 13 mg/dL (ref 6–23)
CO2: 29 mEq/L (ref 19–32)
Calcium: 9.1 mg/dL (ref 8.4–10.5)
Chloride: 104 mEq/L (ref 96–112)
Creatinine, Ser: 0.82 mg/dL (ref 0.50–1.35)
Glucose, Bld: 80 mg/dL (ref 70–99)
Potassium: 4 mEq/L (ref 3.5–5.3)
Sodium: 140 mEq/L (ref 135–145)
Total Bilirubin: 1.1 mg/dL (ref 0.3–1.2)
Total Protein: 6.4 g/dL (ref 6.0–8.3)

## 2012-05-29 LAB — CHCC SATELLITE - SMEAR

## 2012-05-29 LAB — FERRITIN: Ferritin: 12 ng/mL — ABNORMAL LOW (ref 22–322)

## 2012-05-29 NOTE — Progress Notes (Signed)
This office note has been dictated.

## 2012-05-29 NOTE — Progress Notes (Signed)
Patient seen by Dr. Myna Hidalgo, no phlebotomy indicated. HCT  44.3.  [name] [name], [name] [name]

## 2012-05-30 NOTE — Progress Notes (Signed)
CC:   [name] [name]. [name] [name], M.D.  DIAGNOSES:  Polycythemia vera, JAK2 positive.  CURRENT THERAPY: 1. Hydrea 500 mg p.o. b.i.d. 2. Aspirin 81 mg p.o. daily. 3. Phlebotomy to keep hematocrit below 45%.  INTERIM HISTORY:  [name] [name] comes in today for followup.  We last saw him back in December.  He is doing okay.  He is under a lot of stress with work and family issues.  As always, he will get through this.  I think his last phlebotomy was back in December.  His hematocrit was 44.5%.  I felt that he likely would benefit from phlebotomy.  He is complaining of pain in both axilla.  This comes and goes.  It is not a sharp pain.  It is more of a nagging type of pain.  There is no burning associated with this.  It is not associated with any radicular component.  I am not sure what could be causing this.  He is on Hydrea.  Sometimes Hydrea might be associated with chest wall pain.  He has had no cough or shortness of breath.  He has had no bleeding. There is no change in bowel or bladder habits.  He has not had any cardiac issues.  He is on a beta-blocker.  There has been no leg swelling.  He has had no rashes.  PHYSICAL EXAMINATION:  General:  This is a well-developed, well- nourished white gentleman in no obvious distress.  Vital signs:  Show temperature of 98.1, pulse 56, respiratory rate 18, blood pressure 141/82.  Weight is 201.  Head and neck:  Shows a normocephalic, atraumatic skull.  There are no ocular or oral lesions.  There are no palpable cervical or supraclavicular lymph nodes.  Lungs:  Clear bilaterally.  Cardiac:  Regular rate and rhythm with a normal S1, S2. There are no murmurs, rubs or bruits.  Abdomen:  Soft with good bowel sounds.  There is no palpable abdominal mass.  There is no palpable hepatosplenomegaly.  Extremities:  Show no clubbing, cyanosis or edema. Skin:  No rash, ecchymosis or petechia.  Neurological:  Shows no focal neurological  deficits.  LABORATORY STUDIES:  White cell count is 5.8, hemoglobin 15.2, hematocrit 44.3, platelet count 355.  MCV is 97.  Ferritin (04/16/2012) was 18.  IMPRESSION:  [name] [name] is a 57 year old gentleman with polycythemia. Again, he is JAK2 positive.  We will hold on phlebotomy for right now.  I am not sure what the source of this axillary discomfort is.  I cannot feel anything when I examine his axilla.  There is no adenopathy.  There is really not much tenderness when I palpated the axillary region.  We will plan to get him back in 2 months' time.  I think we could probably keep his visits to every 2 months.  I do not think that his hematocrit will go up all that quickly given that he is iron deficient and that he is on Hydrea.    ______________________________ Josph Macho, M.D. PRE/MEDQ  D:  05/29/2012  T:  05/30/2012  Job:  [id number]

## 2012-07-16 ENCOUNTER — Other Ambulatory Visit: Payer: Managed Care, Other (non HMO) | Admitting: Lab

## 2012-07-16 ENCOUNTER — Ambulatory Visit: Payer: Managed Care, Other (non HMO) | Admitting: Hematology & Oncology

## 2012-07-31 ENCOUNTER — Ambulatory Visit (HOSPITAL_BASED_OUTPATIENT_CLINIC_OR_DEPARTMENT_OTHER): Payer: Managed Care, Other (non HMO)

## 2012-07-31 ENCOUNTER — Ambulatory Visit (HOSPITAL_BASED_OUTPATIENT_CLINIC_OR_DEPARTMENT_OTHER): Payer: Managed Care, Other (non HMO) | Admitting: Medical

## 2012-07-31 ENCOUNTER — Other Ambulatory Visit (HOSPITAL_BASED_OUTPATIENT_CLINIC_OR_DEPARTMENT_OTHER): Payer: Managed Care, Other (non HMO) | Admitting: Lab

## 2012-07-31 VITALS — BP 113/83 | HR 68 | Temp 97.7°F | Resp 18 | Ht 72.0 in | Wt 202.0 lb

## 2012-07-31 DIAGNOSIS — D751 Secondary polycythemia: Secondary | ICD-10-CM

## 2012-07-31 DIAGNOSIS — D45 Polycythemia vera: Secondary | ICD-10-CM

## 2012-07-31 LAB — CBC WITH DIFFERENTIAL (CANCER CENTER ONLY)
BASO#: 0.1 10*3/uL (ref 0.0–0.2)
BASO%: 3 % — ABNORMAL HIGH (ref 0.0–2.0)
EOS%: 2.4 % (ref 0.0–7.0)
Eosinophils Absolute: 0.1 10*3/uL (ref 0.0–0.5)
HCT: 44.4 % (ref 38.7–49.9)
HGB: 16 g/dL (ref 13.0–17.1)
LYMPH#: 0.8 10*3/uL — ABNORMAL LOW (ref 0.9–3.3)
LYMPH%: 16.1 % (ref 14.0–48.0)
MCH: 33.8 pg — ABNORMAL HIGH (ref 28.0–33.4)
MCHC: 36 g/dL — ABNORMAL HIGH (ref 32.0–35.9)
MCV: 94 fL (ref 82–98)
MONO#: 0.5 10*3/uL (ref 0.1–0.9)
MONO%: 9.9 % (ref 0.0–13.0)
NEUT#: 3.2 10*3/uL (ref 1.5–6.5)
NEUT%: 68.6 % (ref 40.0–80.0)
Platelets: 293 10*3/uL (ref 145–400)
RBC: 4.74 10*6/uL (ref 4.20–5.70)
RDW: 18.8 % — ABNORMAL HIGH (ref 11.1–15.7)
WBC: 4.7 10*3/uL (ref 4.0–10.0)

## 2012-07-31 LAB — LACTATE DEHYDROGENASE: LDH: 244 U/L (ref 94–250)

## 2012-07-31 LAB — COMPREHENSIVE METABOLIC PANEL
ALT: 64 U/L — ABNORMAL HIGH (ref 0–53)
AST: 30 U/L (ref 0–37)
Albumin: 4.6 g/dL (ref 3.5–5.2)
Alkaline Phosphatase: 78 U/L (ref 39–117)
BUN: 11 mg/dL (ref 6–23)
CO2: 28 mEq/L (ref 19–32)
Calcium: 9.3 mg/dL (ref 8.4–10.5)
Chloride: 101 mEq/L (ref 96–112)
Creatinine, Ser: 0.99 mg/dL (ref 0.50–1.35)
Glucose, Bld: 79 mg/dL (ref 70–99)
Potassium: 4.2 mEq/L (ref 3.5–5.3)
Sodium: 138 mEq/L (ref 135–145)
Total Bilirubin: 0.9 mg/dL (ref 0.3–1.2)
Total Protein: 6.5 g/dL (ref 6.0–8.3)

## 2012-07-31 LAB — CHCC SATELLITE - SMEAR

## 2012-07-31 LAB — FERRITIN: Ferritin: 52 ng/mL (ref 22–322)

## 2012-07-31 NOTE — Patient Instructions (Signed)
Polycythemia Vera  Polycythemia Dwana Curd is a condition in which the body makes too many red blood cells and there is no known cause. The red blood cells (erythrocytes) are the cells which carry the oxygen in your blood stream to the cells of your body. Because of the increased red blood cells, the blood becomes thicker and does not circulate as well. It would be similar to your car having oil which is too thick so it cannot start and circulate as well. When the blood is too thick it often causes headaches and dizziness. It may also cause blood clots. Even though the blood clots easier, these patients bleed easier. The bleeding is caused because the blood cells which help stop bleeding (platelets) do not function normally. It occurs in all age groups but is more common in the 53 to 80 year age range. TREATMENT  The treatment of polycythemia vera for many years has been blood removal (phlebotomy) which is similar to blood removal in a blood bank, however this blood is not used for donation. Hydroxyurea is used to supplement phlebotomy. Aspirin is commonly given to thin the blood as long as the patient does not have a problem with bleeding. Other drugs are used based on the progression of the disease. Document Released: 01/16/2001 Document Revised: 07/16/2011 Document Reviewed: 07/23/2008 [facility] Patient Information 2013 [place], [place].

## 2012-07-31 NOTE — Progress Notes (Signed)
Diagnosis: Polycythemia vera, JAK 2 positive.  Current therapy: #1 Hydrea, 500 mg by mouth twice a day. #2 aspirin 81 mg by mouth daily. #3 phlebotomy to keep, hematocrit below 45%.  Interim history: [name] [name] presents today for an office followup visit.  [name] [name], reports that he's been doing relatively well.  He's not had any new problems or new complaints.  He's not on any new medication.  He still likes to the, seen on an every 6-8 week basis.Marland Kitchen  He feels, that if he is phlebotomized more often, he will be less sluggish.  His hemoglobin today is 44.4.  He will receive a phlebotomy.  We are keeping him iron deficient.  His last ferritin in January, was 12.  He's not reporting any nausea, vomiting, he's not reporting any visual changes, or rashes.  He does have a an occasional headache.  He denies any fevers, chills, or night sweats.  He denies any cough, chest pain, or shortness of breath.  He's not noted any problems with leg swelling.  He denies any obvious, or abnormal bleeding.  He denies any recent or chronic infections.  Review of Systems: Constitutional:Negative for malaise/fatigue, fever, chills, weight loss, diaphoresis, activity change, appetite change, and unexpected weight change.  HEENT: Negative for double vision, blurred vision, visual loss, ear pain, tinnitus, congestion, rhinorrhea, epistaxis sore throat or sinus disease, oral pain/lesion, tongue soreness Respiratory: Negative for cough, chest tightness, shortness of breath, wheezing and stridor.  Cardiovascular: Negative for chest pain, palpitations, leg swelling, orthopnea, PND, DOE or claudication Gastrointestinal: Negative for nausea, vomiting, abdominal pain, diarrhea, constipation, blood in stool, melena, hematochezia, abdominal distention, anal bleeding, rectal pain, anorexia and hematemesis.  Genitourinary: Negative for dysuria, frequency, hematuria,  Musculoskeletal: Negative for myalgias, back pain, joint swelling,  arthralgias and gait problem.  Skin: Negative for rash, color change, pallor and wound.  Neurological:. Negative for dizziness/light-headedness, tremors, seizures, syncope, facial asymmetry, speech difficulty, weakness, numbness, headaches and paresthesias.  Hematological: Negative for adenopathy. Does not bruise/bleed easily.  Psychiatric/Behavioral:  Negative for depression, no loss of interest in normal activity or change in sleep pattern.   Physical Exam:  This is a pleasant, 57 year old, well-developed, well-nourished, white gentleman, in no obvious distress Vitals:,  temperature 97.7 degrees, pulse 68, respirations 18, blood pressure 113/83, weight 202 pounds  HEENT reveals a normocephalic, atraumatic skull, no scleral icterus, no oral lesions  Neck is supple without any cervical or supraclavicular adenopathy.  Lungs are clear to auscultation bilaterally. There are no wheezes, rales or rhonci Cardiac is regular rate and rhythm with a normal S1 and S2. There are no murmurs, rubs, or bruits.  Abdomen is soft with good bowel sounds, there is no palpable mass. There is no palpable hepatosplenomegaly. There is no palpable fluid wave.  Musculoskeletal no tenderness of the spine, ribs, or hips.  Extremities there are no clubbing, cyanosis, or edema.  Skin no petechia, purpura or ecchymosis Neurologic is nonfocal.  Laboratory Data:  White count 4.7, hemoglobin 16.0, hematocrit 44.4, platelets 293,000  Current Outpatient Prescriptions on File Prior to Visit  Medication Sig Dispense Refill  . acetaminophen (TYLENOL) 500 MG tablet Take 1,000 mg by mouth every 6 (six) hours as needed. For pain       . alfuzosin (UROXATRAL) 10 MG 24 hr tablet Take 10 mg by mouth every other day.       Marland Kitchen aspirin 81 MG tablet Take 81 mg by mouth daily.        . clidinium-chlordiazePOXIDE (  LIBRAX) 2.5-5 MG per capsule TAKE 1 CAPSULE BY MOUTH 3 (THREE) TIMES DAILY BEFORE MEALS.  100 capsule  2  . DiphenhydrAMINE HCl  (ZZZQUIL) 50 MG/30ML LIQD Take by mouth as needed.       . diphenoxylate-atropine (LOMOTIL) 2.5-0.025 MG per tablet Take 1 tablet by mouth 4 (four) times daily as needed. For diarrhea      . finasteride (PROSCAR) 5 MG tablet 5 mg every other day.       . hydroxyurea (HYDREA) 500 MG capsule TAKE 2 CAPSULES BY MOUTH EVERY DAY  60 capsule  9  . hyoscyamine (LEVBID) 0.375 MG 12 hr tablet as needed.       . propranolol (INDERAL) 10 MG tablet Take 10 mg by mouth as needed.      . propranolol ER (INDERAL LA) 80 MG 24 hr capsule Take 1 capsule (80 mg total) by mouth daily.  30 capsule  12  . zolpidem (AMBIEN CR) 12.5 MG CR tablet Take by mouth as needed. For sleep pt usually takes 1/2 tablet      . [DISCONTINUED] propranolol (INNOPRAN XL) 80 MG 24 hr capsule Take 1 capsule (80 mg total) by mouth at bedtime.  30 capsule  0   No current facility-administered medications on file prior to visit.   Assessment/Plan: This is a pleasant, 57 year old, white gentleman, with the following issues:  #1 polycythemia vera. He is JAK 2 positive.  Marland Kitchen  He wishes to have a phlebotomy today.  We like to keep his hematocrit below 45%.    #2.Follow.  [name] [name] follow back up with [place] in 8 weeks, but before then should there be questions or concerns.

## 2012-07-31 NOTE — Progress Notes (Signed)
[name] [name] presents today for phlebotomy per MD orders. Phlebotomy procedure started at 1515 and ended at 1540. Approximately 1/3 bag removed. Cannulated x 2 d/t sluggish blood flow.  Patient observed for 30 minutes after procedure without any incident. Patient tolerated procedure well. IV needle removed intact.

## 2012-08-27 ENCOUNTER — Other Ambulatory Visit: Payer: Self-pay | Admitting: Internal Medicine

## 2012-09-25 ENCOUNTER — Other Ambulatory Visit: Payer: Managed Care, Other (non HMO) | Admitting: Lab

## 2012-09-25 ENCOUNTER — Ambulatory Visit: Payer: Managed Care, Other (non HMO) | Admitting: Medical

## 2012-10-02 ENCOUNTER — Ambulatory Visit (HOSPITAL_BASED_OUTPATIENT_CLINIC_OR_DEPARTMENT_OTHER): Payer: Managed Care, Other (non HMO)

## 2012-10-02 ENCOUNTER — Ambulatory Visit (HOSPITAL_BASED_OUTPATIENT_CLINIC_OR_DEPARTMENT_OTHER): Payer: Managed Care, Other (non HMO) | Admitting: Medical

## 2012-10-02 ENCOUNTER — Other Ambulatory Visit (HOSPITAL_BASED_OUTPATIENT_CLINIC_OR_DEPARTMENT_OTHER): Payer: Managed Care, Other (non HMO) | Admitting: Lab

## 2012-10-02 VITALS — BP 137/91 | HR 57 | Resp 20

## 2012-10-02 VITALS — BP 124/80 | HR 65 | Temp 98.2°F | Resp 18 | Ht 70.0 in | Wt 204.0 lb

## 2012-10-02 DIAGNOSIS — D45 Polycythemia vera: Secondary | ICD-10-CM

## 2012-10-02 DIAGNOSIS — D751 Secondary polycythemia: Secondary | ICD-10-CM

## 2012-10-02 LAB — COMPREHENSIVE METABOLIC PANEL
ALT: 20 U/L (ref 0–53)
AST: 21 U/L (ref 0–37)
Albumin: 4.2 g/dL (ref 3.5–5.2)
Alkaline Phosphatase: 55 U/L (ref 39–117)
BUN: 10 mg/dL (ref 6–23)
CO2: 25 mEq/L (ref 19–32)
Calcium: 8.9 mg/dL (ref 8.4–10.5)
Chloride: 105 mEq/L (ref 96–112)
Creatinine, Ser: 0.83 mg/dL (ref 0.50–1.35)
Glucose, Bld: 112 mg/dL — ABNORMAL HIGH (ref 70–99)
Potassium: 3.7 mEq/L (ref 3.5–5.3)
Sodium: 137 mEq/L (ref 135–145)
Total Bilirubin: 1.2 mg/dL (ref 0.3–1.2)
Total Protein: 6.1 g/dL (ref 6.0–8.3)

## 2012-10-02 LAB — CBC WITH DIFFERENTIAL (CANCER CENTER ONLY)
BASO#: 0.1 10*3/uL (ref 0.0–0.2)
BASO%: 2.7 % — ABNORMAL HIGH (ref 0.0–2.0)
EOS%: 2.5 % (ref 0.0–7.0)
Eosinophils Absolute: 0.1 10*3/uL (ref 0.0–0.5)
HCT: 44.7 % (ref 38.7–49.9)
HGB: 15.7 g/dL (ref 13.0–17.1)
LYMPH#: 0.8 10*3/uL — ABNORMAL LOW (ref 0.9–3.3)
LYMPH%: 15.8 % (ref 14.0–48.0)
MCH: 35.8 pg — ABNORMAL HIGH (ref 28.0–33.4)
MCHC: 35.1 g/dL (ref 32.0–35.9)
MCV: 102 fL — ABNORMAL HIGH (ref 82–98)
MONO#: 0.5 10*3/uL (ref 0.1–0.9)
MONO%: 9.1 % (ref 0.0–13.0)
NEUT#: 3.6 10*3/uL (ref 1.5–6.5)
NEUT%: 69.9 % (ref 40.0–80.0)
Platelets: 350 10*3/uL (ref 145–400)
RBC: 4.38 10*6/uL (ref 4.20–5.70)
RDW: 17 % — ABNORMAL HIGH (ref 11.1–15.7)
WBC: 5.1 10*3/uL (ref 4.0–10.0)

## 2012-10-02 LAB — FERRITIN: Ferritin: 20 ng/mL — ABNORMAL LOW (ref 22–322)

## 2012-10-02 NOTE — Progress Notes (Signed)
[name] [name] presents today for phlebotomy per MD orders. Phlebotomy procedure started at 1555and ended at . 500 mls removed. Patient observed for 30 minutes after procedure without any incident. Patient tolerated procedure well. IV needle removed intact.

## 2012-10-02 NOTE — Progress Notes (Signed)
Diagnosis: Polycythemia vera, JAK 2 positive.  Current therapy: #1 Hydrea, 500 mg by mouth twice a day. #2 aspirin 81 mg by mouth daily. #3 phlebotomy to keep, hematocrit below 45%.  Interim history: [name] presents today for an office followup visit.  [name], reports that he's been doing relatively well.  He's not had any new problems or new complaints.  He's not on any new medication.  He still likes to the, seen on an every 6-8 week basis.Marland Kitchen Unfortunately, the last time he was here the nurses were unable to phlebotomize him.  His hematocrit is 44.7 today.  We will go ahead and phlebotomize him. He feels, that if he is phlebotomized more often, he will be less sluggish.   We like to keep him iron deficient.  His last ferritin in March, was 52.  He's not reporting any nausea, vomiting, he's not reporting any visual changes, or rashes.  He does have a an occasional headache.  He denies any fevers, chills, or night sweats.  He denies any cough, chest pain, or shortness of breath.  He's not noted any problems with leg swelling.  He denies any obvious, or abnormal bleeding.  He denies any recent or chronic infections.  Review of Systems: Constitutional:Negative for malaise/fatigue, fever, chills, weight loss, diaphoresis, activity change, appetite change, and unexpected weight change.  HEENT: Negative for double vision, blurred vision, visual loss, ear pain, tinnitus, congestion, rhinorrhea, epistaxis sore throat or sinus disease, oral pain/lesion, tongue soreness Respiratory: Negative for cough, chest tightness, shortness of breath, wheezing and stridor.  Cardiovascular: Negative for chest pain, palpitations, leg swelling, orthopnea, PND, DOE or claudication Gastrointestinal: Negative for nausea, vomiting, abdominal pain, diarrhea, constipation, blood in stool, melena, hematochezia, abdominal distention, anal bleeding, rectal pain, anorexia and hematemesis.  Genitourinary: Negative for  dysuria, frequency, hematuria,  Musculoskeletal: Negative for myalgias, back pain, joint swelling, arthralgias and gait problem.  Skin: Negative for rash, color change, pallor and wound.  Neurological:. Negative for dizziness/light-headedness, tremors, seizures, syncope, facial asymmetry, speech difficulty, weakness, numbness, headaches and paresthesias.  Hematological: Negative for adenopathy. Does not bruise/bleed easily.  Psychiatric/Behavioral:  Negative for depression, no loss of interest in normal activity or change in sleep pattern.   Physical Exam:  This is a pleasant, 57 year old, well-developed, well-nourished, white gentleman, in no obvious distress Vitals:,  Temperature 97.7 degrees pulse 81 respirations 18 blood pressure 92/55 weight 37 pounds HEENT reveals a normocephalic, atraumatic skull, no scleral icterus, no oral lesions  Neck is supple without any cervical or supraclavicular adenopathy.  Lungs are clear to auscultation bilaterally. There are no wheezes, rales or rhonci Cardiac is regular rate and rhythm with a normal S1 and S2. There are no murmurs, rubs, or bruits.  Abdomen is soft with good bowel sounds, there is no palpable mass. There is no palpable hepatosplenomegaly. There is no palpable fluid wave.   Musculoskeletal no tenderness of the spine, ribs, or hips.  Extremities there are no clubbing, cyanosis, or edema.  Skin no petechia, purpura or ecchymosis Neurologic is nonfocal.  Laboratory Data: White count 5.1 hemoglobin 15.7 hematocrit 44.7 platelets 350,000  Current Outpatient Prescriptions on File Prior to Visit  Medication Sig Dispense Refill  . acetaminophen (TYLENOL) 500 MG tablet Take 1,000 mg by mouth every 6 (six) hours as needed. For pain       . alfuzosin (UROXATRAL) 10 MG 24 hr tablet Take 10 mg by mouth every other day.       Marland Kitchen aspirin 81 MG  tablet Take 81 mg by mouth daily.        . clidinium-chlordiazePOXIDE (LIBRAX) 2.5-5 MG per capsule TAKE 1  CAPSULE BY MOUTH 3 (THREE) TIMES DAILY BEFORE MEALS.  100 capsule  2  . DiphenhydrAMINE HCl (ZZZQUIL) 50 MG/30ML LIQD Take by mouth as needed.       . diphenoxylate-atropine (LOMOTIL) 2.5-0.025 MG per tablet Take 1 tablet by mouth 4 (four) times daily as needed. For diarrhea      . finasteride (PROSCAR) 5 MG tablet 5 mg every other day.       . hydroxyurea (HYDREA) 500 MG capsule TAKE 2 CAPSULES BY MOUTH EVERY DAY  60 capsule  9  . hyoscyamine (LEVBID) 0.375 MG 12 hr tablet as needed.       . propranolol (INDERAL) 10 MG tablet Take 10 mg by mouth as needed.      . propranolol ER (INDERAL LA) 80 MG 24 hr capsule Take 1 capsule (80 mg total) by mouth daily.  30 capsule  12  . zolpidem (AMBIEN CR) 12.5 MG CR tablet Take by mouth as needed. For sleep pt usually takes 1/2 tablet      . [DISCONTINUED] propranolol (INNOPRAN XL) 80 MG 24 hr capsule Take 1 capsule (80 mg total) by mouth at bedtime.  30 capsule  0   No current facility-administered medications on file prior to visit.   Assessment/Plan: This is a pleasant, 57 year old, white gentleman, with the following issues:  #1 polycythemia vera. He is JAK 2 positive.  Marland Kitchen  He wishes to have a phlebotomy today.  We like to keep his hematocrit below 45%.    #2.Follow Up.  [name] follow back up with [place] in 6 weeks, but before then should there be questions or concerns.

## 2012-10-02 NOTE — Patient Instructions (Signed)

## 2012-11-13 ENCOUNTER — Ambulatory Visit (HOSPITAL_BASED_OUTPATIENT_CLINIC_OR_DEPARTMENT_OTHER): Payer: Managed Care, Other (non HMO) | Admitting: Hematology & Oncology

## 2012-11-13 ENCOUNTER — Ambulatory Visit (HOSPITAL_BASED_OUTPATIENT_CLINIC_OR_DEPARTMENT_OTHER): Payer: Managed Care, Other (non HMO)

## 2012-11-13 ENCOUNTER — Other Ambulatory Visit (HOSPITAL_BASED_OUTPATIENT_CLINIC_OR_DEPARTMENT_OTHER): Payer: Managed Care, Other (non HMO) | Admitting: Lab

## 2012-11-13 VITALS — BP 127/86 | HR 64 | Resp 20

## 2012-11-13 VITALS — BP 135/84 | HR 65 | Temp 98.0°F | Resp 18 | Ht 70.0 in | Wt 201.0 lb

## 2012-11-13 DIAGNOSIS — D751 Secondary polycythemia: Secondary | ICD-10-CM

## 2012-11-13 LAB — COMPREHENSIVE METABOLIC PANEL
ALT: 48 U/L (ref 0–53)
AST: 40 U/L — ABNORMAL HIGH (ref 0–37)
Albumin: 4.8 g/dL (ref 3.5–5.2)
Alkaline Phosphatase: 55 U/L (ref 39–117)
BUN: 12 mg/dL (ref 6–23)
CO2: 27 mEq/L (ref 19–32)
Calcium: 9 mg/dL (ref 8.4–10.5)
Chloride: 104 mEq/L (ref 96–112)
Creatinine, Ser: 0.93 mg/dL (ref 0.50–1.35)
Glucose, Bld: 82 mg/dL (ref 70–99)
Potassium: 4.1 mEq/L (ref 3.5–5.3)
Sodium: 139 mEq/L (ref 135–145)
Total Bilirubin: 1 mg/dL (ref 0.3–1.2)
Total Protein: 6.4 g/dL (ref 6.0–8.3)

## 2012-11-13 LAB — CBC WITH DIFFERENTIAL (CANCER CENTER ONLY)
BASO#: 0.2 10*3/uL (ref 0.0–0.2)
BASO%: 3.7 % — ABNORMAL HIGH (ref 0.0–2.0)
EOS%: 2.9 % (ref 0.0–7.0)
Eosinophils Absolute: 0.1 10*3/uL (ref 0.0–0.5)
HCT: 44.6 % (ref 38.7–49.9)
HGB: 15.5 g/dL (ref 13.0–17.1)
LYMPH#: 0.9 10*3/uL (ref 0.9–3.3)
LYMPH%: 17.8 % (ref 14.0–48.0)
MCH: 36.6 pg — ABNORMAL HIGH (ref 28.0–33.4)
MCHC: 34.8 g/dL (ref 32.0–35.9)
MCV: 105 fL — ABNORMAL HIGH (ref 82–98)
MONO#: 0.5 10*3/uL (ref 0.1–0.9)
MONO%: 10.4 % (ref 0.0–13.0)
NEUT#: 3.2 10*3/uL (ref 1.5–6.5)
NEUT%: 65.2 % (ref 40.0–80.0)
Platelets: 306 10*3/uL (ref 145–400)
RBC: 4.23 10*6/uL (ref 4.20–5.70)
RDW: 15.6 % (ref 11.1–15.7)
WBC: 4.8 10*3/uL (ref 4.0–10.0)

## 2012-11-13 NOTE — Progress Notes (Signed)
[name] [name] presents today for phlebotomy per MD orders. Phlebotomy procedure started at 1558 and ended at 1630 Approximately 500 mls removed. Patient observed for 30 minutes after procedure without any incident. Patient tolerated procedure well. IV needle removed intact.

## 2012-11-13 NOTE — Patient Instructions (Signed)

## 2012-11-13 NOTE — Progress Notes (Signed)
This office note has been dictated.

## 2012-11-14 LAB — FERRITIN CHCC: Ferritin: 18 ng/ml — ABNORMAL LOW (ref 22–316)

## 2012-11-14 NOTE — Progress Notes (Signed)
CC:   Barry Dienes. [name], M.D.  DIAGNOSIS:  Polycythemia vera, JAK2 positive.  CURRENT THERAPY: 1. Hydrea 500 mg p.o. b.i.d. 2. Aspirin 81 mg p.o. daily. 3. Phlebotomy to maintain hematocrit below 45%.  INTERIM HISTORY:  Mr. Colden comes in for followup.  We last saw him back in May.  He is doing fairly well.  He had a good summer so far.  He has had no problems with headache.  He has had no abdominal pain.  He has had no cardiac issues.  He has not noted any problems with fevers, sweats or chills.  There has been no leg swelling.  He has had no rashes.  He is iron-deficient.  When we last checked his ferritin, it was 20 back in May.  PHYSICAL EXAMINATION:  General:  This is a well-developed, well- nourished white gentleman in no obvious distress.  Vital signs: Temperature of 98.6, pulse 65, respiratory rate 18, blood pressure 135/84.  Weight is 201.  Head and neck:  Normocephalic, atraumatic skull.  There are no ocular or oral lesions.  There are no palpable cervical or supraclavicular lymph nodes.  Lungs:  Clear bilaterally. Cardiac:  Regular rate and rhythm with a normal S1 and S2.  There are no murmurs, rubs or bruits.  Abdomen:  Soft with good bowel sounds.  There is no palpable abdominal mass.  There is no fluid wave.  There is no palpable hepatosplenomegaly.  Extremities:  Show no clubbing, no cyanosis or edema.  Neurological:  Shows no focal neurological deficits.  LABORATORY STUDIES:  White cell count is 4.8, hemoglobin 15.5, hematocrit 44.6, platelet count is 306.  IMPRESSION:  Mr. Wishon is a 57 year old gentleman with polycythemia vera.  He has had no complications from this to date.  We will go ahead and phlebotomize him.  He just feels better, so that he will not have to come back for another couple of months.  We will plan to get him back after Labor Day now.   ______________________________ Josph Macho, M.D. PRE/MEDQ  D:  11/13/2012  T:   11/14/2012  Job:  [id number]

## 2012-12-25 ENCOUNTER — Ambulatory Visit: Payer: Managed Care, Other (non HMO) | Admitting: Hematology & Oncology

## 2012-12-25 ENCOUNTER — Other Ambulatory Visit: Payer: Managed Care, Other (non HMO) | Admitting: Lab

## 2013-01-08 ENCOUNTER — Other Ambulatory Visit: Payer: Self-pay | Admitting: Hematology & Oncology

## 2013-01-08 ENCOUNTER — Ambulatory Visit (HOSPITAL_BASED_OUTPATIENT_CLINIC_OR_DEPARTMENT_OTHER): Payer: Managed Care, Other (non HMO) | Admitting: Hematology & Oncology

## 2013-01-08 ENCOUNTER — Other Ambulatory Visit (HOSPITAL_BASED_OUTPATIENT_CLINIC_OR_DEPARTMENT_OTHER): Payer: Managed Care, Other (non HMO) | Admitting: Lab

## 2013-01-08 ENCOUNTER — Ambulatory Visit: Payer: Managed Care, Other (non HMO)

## 2013-01-08 VITALS — BP 120/72 | HR 67 | Temp 98.0°F | Resp 67 | Ht 70.0 in | Wt 202.0 lb

## 2013-01-08 DIAGNOSIS — D751 Secondary polycythemia: Secondary | ICD-10-CM

## 2013-01-08 DIAGNOSIS — D45 Polycythemia vera: Secondary | ICD-10-CM

## 2013-01-08 LAB — CBC WITH DIFFERENTIAL (CANCER CENTER ONLY)
BASO#: 0.1 10*3/uL (ref 0.0–0.2)
BASO%: 2.3 % — ABNORMAL HIGH (ref 0.0–2.0)
EOS%: 2.5 % (ref 0.0–7.0)
Eosinophils Absolute: 0.1 10*3/uL (ref 0.0–0.5)
HCT: 43.4 % (ref 38.7–49.9)
HGB: 15.2 g/dL (ref 13.0–17.1)
LYMPH#: 0.9 10*3/uL (ref 0.9–3.3)
LYMPH%: 18.8 % (ref 14.0–48.0)
MCH: 37.3 pg — ABNORMAL HIGH (ref 28.0–33.4)
MCHC: 35 g/dL (ref 32.0–35.9)
MCV: 106 fL — ABNORMAL HIGH (ref 82–98)
MONO#: 0.3 10*3/uL (ref 0.1–0.9)
MONO%: 6.9 % (ref 0.0–13.0)
NEUT#: 3.3 10*3/uL (ref 1.5–6.5)
NEUT%: 69.5 % (ref 40.0–80.0)
Platelets: 255 10*3/uL (ref 145–400)
RBC: 4.08 10*6/uL — ABNORMAL LOW (ref 4.20–5.70)
RDW: 14.9 % (ref 11.1–15.7)
WBC: 4.8 10*3/uL (ref 4.0–10.0)

## 2013-01-08 LAB — COMPREHENSIVE METABOLIC PANEL
ALT: 32 U/L (ref 0–53)
AST: 25 U/L (ref 0–37)
Albumin: 4.4 g/dL (ref 3.5–5.2)
Alkaline Phosphatase: 49 U/L (ref 39–117)
BUN: 12 mg/dL (ref 6–23)
CO2: 26 mEq/L (ref 19–32)
Calcium: 8.5 mg/dL (ref 8.4–10.5)
Chloride: 104 mEq/L (ref 96–112)
Creatinine, Ser: 0.73 mg/dL (ref 0.50–1.35)
Glucose, Bld: 86 mg/dL (ref 70–99)
Potassium: 3.7 mEq/L (ref 3.5–5.3)
Sodium: 139 mEq/L (ref 135–145)
Total Bilirubin: 1.1 mg/dL (ref 0.3–1.2)
Total Protein: 6.2 g/dL (ref 6.0–8.3)

## 2013-01-08 LAB — LACTATE DEHYDROGENASE: LDH: 212 U/L (ref 94–250)

## 2013-01-08 LAB — CHCC SATELLITE - SMEAR

## 2013-01-08 NOTE — Progress Notes (Signed)
No phlebotomy needed today per d.r ennever 

## 2013-01-08 NOTE — Progress Notes (Signed)
This office note has been dictated.

## 2013-01-09 LAB — FERRITIN CHCC: Ferritin: 14 ng/ml — ABNORMAL LOW (ref 22–316)

## 2013-01-09 NOTE — Progress Notes (Signed)
CC:   [name] Rea. [name], MD, [name], FACP, FAGA [name], M.D.  DIAGNOSIS:  Polycythemia vera-JAK2 positive.  CURRENT THERAPY: 1. Hydrea 500 mg p.o. b.i.d. 2. Aspirin 81 mg p.o. daily. 3. Phlebotomy to maintain hematocrit below 45%.  INTERIM HISTORY:  [name] [name] comes back for his followup.  He is doing quite well.  He has had a very busy summer.  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  He really needed to "treat himself" because quality is done for his work and his family.  He was last phlebotomized him, I think maybe back in May.  His last ferritin was 18.  He is not having any kind of cough or shortness of breath issues.  He does have some occasional palpitations.  There has been no problems with bowels or bladder.  He has had no rashes.  He has had no leg swelling.  PHYSICAL EXAMINATION:  General:  This is a well-developed, well- nourished white gentleman in no obvious distress.  Vital signs: Temperature of 98, pulse 67, respiratory rate 18, blood pressure 120/72. Weight is 202.  Head and neck exam:  Shows no ocular or oral lesions. There is no scleral icterus.  He has no conjunctival inflammation. There is no facial plethora.  Lungs:  Clear bilaterally.  Cardiac: Regular rate and rhythm with a normal S1, S2.  There are no murmurs, rubs or bruits.  Abdomen:  Soft.  He has good bowel sounds.  There is no fluid wave.  There is no palpable hepatosplenomegaly.  Extremities: Show no clubbing, cyanosis or edema.  Skin:  No rashes, ecchymoses or petechia.  There is no ruddy complexion.  LABORATORY STUDIES:  White blood cell count 4.8, hemoglobin 15.2, hematocrit 43.4, platelet count 255,000.  IMPRESSION:  [name] [name] is a 57 year old gentleman with polycythemia vera.  Again, he is JAK2 positive.  He does not need to be phlebotomized today.  I will plan to get him back in another, I think 6 weeks so.  I think that we are doing pretty good with respect to any  complications from the polycythemia.    ______________________________ [name] [name], M.D. PRE/MEDQ  D:  01/08/2013  T:  01/09/2013  Job:  [id number]

## 2013-02-07 ENCOUNTER — Other Ambulatory Visit: Payer: Self-pay | Admitting: Cardiology

## 2013-03-04 ENCOUNTER — Other Ambulatory Visit (HOSPITAL_BASED_OUTPATIENT_CLINIC_OR_DEPARTMENT_OTHER): Payer: Managed Care, Other (non HMO) | Admitting: Lab

## 2013-03-04 ENCOUNTER — Ambulatory Visit (HOSPITAL_BASED_OUTPATIENT_CLINIC_OR_DEPARTMENT_OTHER)
Admission: RE | Admit: 2013-03-04 | Discharge: 2013-03-04 | Disposition: A | Discharge status: Home / Self Care | Payer: Managed Care, Other (non HMO) | Source: Ambulatory Visit | Attending: Hematology & Oncology | Admitting: Hematology & Oncology

## 2013-03-04 ENCOUNTER — Ambulatory Visit: Payer: Managed Care, Other (non HMO)

## 2013-03-04 ENCOUNTER — Ambulatory Visit (HOSPITAL_BASED_OUTPATIENT_CLINIC_OR_DEPARTMENT_OTHER): Payer: Managed Care, Other (non HMO) | Admitting: Hematology & Oncology

## 2013-03-04 VITALS — BP 107/75 | HR 63 | Temp 98.3°F | Resp 18 | Ht 70.0 in | Wt 202.0 lb

## 2013-03-04 DIAGNOSIS — M25559 Pain in unspecified hip: Secondary | ICD-10-CM | POA: Insufficient documentation

## 2013-03-04 DIAGNOSIS — M1990 Unspecified osteoarthritis, unspecified site: Secondary | ICD-10-CM

## 2013-03-04 DIAGNOSIS — D751 Secondary polycythemia: Secondary | ICD-10-CM

## 2013-03-04 LAB — CBC WITH DIFFERENTIAL (CANCER CENTER ONLY)
BASO#: 0.1 10*3/uL (ref 0.0–0.2)
BASO%: 2.1 % — ABNORMAL HIGH (ref 0.0–2.0)
EOS%: 1.9 % (ref 0.0–7.0)
Eosinophils Absolute: 0.1 10*3/uL (ref 0.0–0.5)
HCT: 41.6 % (ref 38.7–49.9)
HGB: 14.9 g/dL (ref 13.0–17.1)
LYMPH#: 0.9 10*3/uL (ref 0.9–3.3)
LYMPH%: 14.8 % (ref 14.0–48.0)
MCH: 39 pg — ABNORMAL HIGH (ref 28.0–33.4)
MCHC: 35.8 g/dL (ref 32.0–35.9)
MCV: 109 fL — ABNORMAL HIGH (ref 82–98)
MONO#: 0.8 10*3/uL (ref 0.1–0.9)
MONO%: 12.2 % (ref 0.0–13.0)
NEUT#: 4.3 10*3/uL (ref 1.5–6.5)
NEUT%: 69 % (ref 40.0–80.0)
Platelets: 332 10*3/uL (ref 145–400)
RBC: 3.82 10*6/uL — ABNORMAL LOW (ref 4.20–5.70)
RDW: 17.5 % — ABNORMAL HIGH (ref 11.1–15.7)
WBC: 6.2 10*3/uL (ref 4.0–10.0)

## 2013-03-04 MED ORDER — MELOXICAM 15 MG PO TABS: 15.0000 mg | ORAL_TABLET | Freq: Every day | ORAL | Status: DC

## 2013-03-04 NOTE — Progress Notes (Signed)
Pt seen by Dr. Myna Hidalgo, no phlebotomy today, Hgb 14.0, Hct 41.6.

## 2013-03-04 NOTE — Progress Notes (Signed)
This office note has been dictated.

## 2013-03-05 ENCOUNTER — Telehealth: Payer: Self-pay | Admitting: *Deleted

## 2013-03-05 LAB — FERRITIN CHCC: Ferritin: 81 ng/ml (ref 22–316)

## 2013-03-05 NOTE — Telephone Encounter (Signed)
Called patient to let him know that his hip xray is ok per dr. Myna Hidalgo.  If continues, may need to see orthopedist

## 2013-03-05 NOTE — Telephone Encounter (Signed)
Message copied by Anselm Jungling on Thu Mar 05, 2013 12:01 PM ------      Message from: Arlan Organ R      Created: Wed Mar 04, 2013  7:10 PM       Call - hip xray looks ok!!  If continued issues, may need to see orthopedist.  Cindee Lame ------

## 2013-03-05 NOTE — Progress Notes (Signed)
DIAGNOSIS:  Polycythemia vera-JAK2 positive.  CURRENT THERAPY: 1. Hydrea 500 mg p.o. b.i.d. 2. Aspirin 81 mg p.o. daily. 3. Phlebotomy to maintain hematocrit below 45%.  INTERIM HISTORY:  Mr. Alegria comes in for followup.  We last saw him back in early September.  He has been doing pretty well.  He and his wife were in [place] a couple of weeks ago.  He had a good time.  I think he was last phlebotomized back in July.  Back in September, his ferritin was only 14.  He was complaining of some pain in the right hip. He does have some arthritic issues.  We will get some x-rays of the right hip to see how that is looking.  He has had no cough.  He has had no nausea or vomiting.  There has been no change in bowel or bladder habits.  He has had no leg swelling. There has been no rashes.  PHYSICAL EXAMINATION:  General:  This is a well-developed, well- nourished white gentleman in no obvious distress.  Vital signs: Temperature of 98.3, pulse 63, respiratory rate 18, blood pressure 107/75.  Weight is 202 pounds.  Head and Neck:  Show a normocephalic atraumatic skull.  There are no ocular or oral lesions.  There are no palpable cervical or supraclavicular lymph nodes.  Lungs:  Clear bilaterally.  Cardiac:  Regular rate and rhythm with a normal S1 and S2. There are no murmurs, rubs, or bruits.  Abdomen:  Soft.  He has good bowel sounds.  There is no fluid wave.  There is no palpable abdominal mass.  There is no palpable hepatosplenomegaly.  Back:  Exam shows no tenderness over the spine, ribs, or hips.  He has decent range of motion of the right hip.  Extremities:  Show no clubbing, cyanosis, or edema. He has good strength in his legs. SKIN:  No rashes, ecchymosis, or petechiae.  Neurological:  Shows no focal neurological deficits.  LABORATORY STUDIES:  White cell count is 6.2, hemoglobin 14.9, hematocrit 41.6, platelet count 332.  MCV is 109.  IMPRESSION:  Mr. Arterberry is a 57 year old  gentleman with polycythemia vera.  Again, he is JAK2 positive. He has done very well.  We have been following him now for, I think, a good 7 or 8 years.  He does not need to be phlebotomized today.  I am not sure why he is having all this arthritic issue.  He had been on Mobic before.  I do not see any problems with him being on Mobic.  I told him to make sure he takes the Mobic with food. We will see what the x-ray shows of his left hip.  I reviewed his lab work with him.  He is very satisfied with how well he is doing.  I looked at his smear under the microscope.  I do not see anything that would suggest any transformation to a more malignant bone marrow disorder.  We will plan to get him back in 6 weeks time.    ______________________________ Josph Macho, M.D. PRE/MEDQ  D:  03/04/2013  T:  03/05/2013  Job:  [id number]

## 2013-03-18 ENCOUNTER — Other Ambulatory Visit: Payer: Self-pay | Admitting: Cardiology

## 2013-04-16 ENCOUNTER — Ambulatory Visit (HOSPITAL_BASED_OUTPATIENT_CLINIC_OR_DEPARTMENT_OTHER): Payer: Managed Care, Other (non HMO) | Admitting: Hematology & Oncology

## 2013-04-16 ENCOUNTER — Ambulatory Visit: Payer: Managed Care, Other (non HMO)

## 2013-04-16 ENCOUNTER — Other Ambulatory Visit (HOSPITAL_BASED_OUTPATIENT_CLINIC_OR_DEPARTMENT_OTHER): Payer: Managed Care, Other (non HMO) | Admitting: Lab

## 2013-04-16 VITALS — BP 115/66 | HR 72 | Temp 98.0°F | Resp 14 | Ht 70.0 in | Wt 204.0 lb

## 2013-04-16 DIAGNOSIS — D45 Polycythemia vera: Secondary | ICD-10-CM

## 2013-04-16 DIAGNOSIS — D751 Secondary polycythemia: Secondary | ICD-10-CM

## 2013-04-16 DIAGNOSIS — R002 Palpitations: Secondary | ICD-10-CM

## 2013-04-16 LAB — CMP (CANCER CENTER ONLY)
ALT(SGPT): 38 U/L (ref 10–47)
AST: 32 U/L (ref 11–38)
Albumin: 4 g/dL (ref 3.3–5.5)
Alkaline Phosphatase: 51 U/L (ref 26–84)
BUN, Bld: 11 mg/dL (ref 7–22)
CO2: 32 mEq/L (ref 18–33)
Calcium: 8.6 mg/dL (ref 8.0–10.3)
Chloride: 102 mEq/L (ref 98–108)
Creat: 0.9 mg/dl (ref 0.6–1.2)
Glucose, Bld: 98 mg/dL (ref 73–118)
Potassium: 3.7 mEq/L (ref 3.3–4.7)
Sodium: 138 mEq/L (ref 128–145)
Total Bilirubin: 1.4 mg/dl (ref 0.20–1.60)
Total Protein: 6.7 g/dL (ref 6.4–8.1)

## 2013-04-16 LAB — CBC WITH DIFFERENTIAL (CANCER CENTER ONLY)
BASO#: 0.1 10*3/uL (ref 0.0–0.2)
BASO%: 2.4 % — ABNORMAL HIGH (ref 0.0–2.0)
EOS%: 2.1 % (ref 0.0–7.0)
Eosinophils Absolute: 0.1 10*3/uL (ref 0.0–0.5)
HCT: 39.5 % (ref 38.7–49.9)
HGB: 13.8 g/dL (ref 13.0–17.1)
LYMPH#: 0.7 10*3/uL — ABNORMAL LOW (ref 0.9–3.3)
LYMPH%: 17.9 % (ref 14.0–48.0)
MCH: 40.1 pg — ABNORMAL HIGH (ref 28.0–33.4)
MCHC: 34.9 g/dL (ref 32.0–35.9)
MCV: 115 fL — ABNORMAL HIGH (ref 82–98)
MONO#: 0.2 10*3/uL (ref 0.1–0.9)
MONO%: 6.3 % (ref 0.0–13.0)
NEUT#: 2.7 10*3/uL (ref 1.5–6.5)
NEUT%: 71.3 % (ref 40.0–80.0)
Platelets: 305 10*3/uL (ref 145–400)
RBC: 3.44 10*6/uL — ABNORMAL LOW (ref 4.20–5.70)
RDW: 15.3 % (ref 11.1–15.7)
WBC: 3.8 10*3/uL — ABNORMAL LOW (ref 4.0–10.0)

## 2013-04-16 LAB — LACTATE DEHYDROGENASE: LDH: 244 U/L (ref 94–250)

## 2013-04-16 NOTE — Progress Notes (Incomplete)
Per

## 2013-04-16 NOTE — Progress Notes (Signed)
This office note has been dictated.

## 2013-04-17 LAB — IRON AND TIBC CHCC
%SAT: 39 % (ref 20–55)
Iron: 144 ug/dL (ref 42–163)
TIBC: 370 ug/dL (ref 202–409)
UIBC: 226 ug/dL (ref 117–376)

## 2013-04-17 LAB — FERRITIN CHCC: Ferritin: 57 ng/ml (ref 22–316)

## 2013-04-17 LAB — TSH CHCC: TSH: 1.823 m(IU)/L (ref 0.320–4.118)

## 2013-04-18 NOTE — Progress Notes (Signed)
CC:   [name] [name]. [name] [name], M.D.  DIAGNOSIS:  Polycythemia vera -- JAK-2 positive.  CURRENT THERAPY: 1. Hydrea 500 mg p.o. b.i.d., alternating with daily dosing. 2. Aspirin 81 mg p.o. daily. 3. Phlebotomy to maintain hematocrit below 45%.  INTERIM HISTORY:  [name] [name] comes in for his followup.  He is doing fairly well.  He does feel tired.  I think a lot of this is due to the fact that he is iron deficient.  We have knocked his iron down.  His last ferritin was actually 81 back in October.  We have not phlebotomized him now probably for a good 5 years.  I am going to go ahead and adjust his Hydrea dose because his white cell count is down a little bit.  I think we can probably alternate 500 mg a day with 1000 mg a day.  He has had no bleeding or bruising.  He has had no fever.  He has had no palpitations.  He has had no change in bowel or bladder habits.  PHYSICAL EXAMINATION:  General:  This is a well-developed, well- nourished white gentleman, in no obvious distress.  Vital Signs: Temperature of 98, pulse 72, respiratory rate 14, blood pressure 115/66. Weight is 204 pounds.  Head and Neck:  Normocephalic, atraumatic skull. There are no ocular or oral lesions.  There is no palpable cervical or supraclavicular lymph nodes.  Lungs:  Clear bilaterally.  Cardiac: Regular rate and rhythm with a normal S1 and S2.  There are no murmurs, rubs, or bruits.  Abdomen:  Soft.  He has good bowel sounds.  There is no fluid wave.  I cannot palpate his spleen tip.  There is no palpable hepatomegaly.  Back:  No tenderness over the spine, ribs, or hips. Extremities:  No clubbing, cyanosis, or edema.  There is good range motion of his joints.  Skin:  No rashes, ecchymosis, or petechiae. Neurologic:  No focal neurological deficits.  LABORATORY STUDIES:  White cell count is 3.8, hemoglobin 13.8, hematocrit 39.5, platelet count 305.  MCV is 115.  His TSH is pending.  IMPRESSION:  [name] [name] is  a 57 year old gentleman with polycythemia vera.  Again, he is JAK-2 positive.  We have been seeing him now for about 7 years or more.  We are still holding on phlebotomy.  Again, hopefully, adjusting his Hydrea dose might make him feel a little bit better.  We will plan to check his thyroid because of his fatigue.  I want to see him back in about 6 weeks' time.  Hopefully, we will see how he is feeling, maybe he might be feeling a little bit better.    ______________________________ Josph Macho, M.D. PRE/MEDQ  D:  04/16/2013  T:  04/17/2013  Job:  [id number]

## 2013-04-20 ENCOUNTER — Telehealth: Payer: Self-pay | Admitting: Nurse Practitioner

## 2013-04-20 NOTE — Telephone Encounter (Addendum)
Message copied by Glee Arvin on Mon Apr 20, 2013  5:51 PM ------      Message from: Josph Macho      Created: Sun Apr 19, 2013  8:36 PM       Call - thyroid is normal!! Cindee Lame ------LVM on pt's personal machine and instructed him to contact our office if he has further concerns.

## 2013-06-04 ENCOUNTER — Ambulatory Visit (HOSPITAL_BASED_OUTPATIENT_CLINIC_OR_DEPARTMENT_OTHER): Payer: Managed Care, Other (non HMO) | Admitting: Hematology & Oncology

## 2013-06-04 ENCOUNTER — Other Ambulatory Visit (HOSPITAL_BASED_OUTPATIENT_CLINIC_OR_DEPARTMENT_OTHER): Payer: Managed Care, Other (non HMO) | Admitting: Lab

## 2013-06-04 ENCOUNTER — Encounter: Payer: Self-pay | Admitting: Hematology & Oncology

## 2013-06-04 ENCOUNTER — Ambulatory Visit (HOSPITAL_BASED_OUTPATIENT_CLINIC_OR_DEPARTMENT_OTHER): Payer: Managed Care, Other (non HMO)

## 2013-06-04 VITALS — BP 140/100 | HR 75 | Resp 20

## 2013-06-04 VITALS — BP 136/81 | HR 73 | Temp 98.2°F | Resp 18 | Ht 72.0 in | Wt 208.0 lb

## 2013-06-04 DIAGNOSIS — R002 Palpitations: Secondary | ICD-10-CM

## 2013-06-04 DIAGNOSIS — Z7982 Long term (current) use of aspirin: Secondary | ICD-10-CM

## 2013-06-04 DIAGNOSIS — D751 Secondary polycythemia: Secondary | ICD-10-CM

## 2013-06-04 LAB — COMPREHENSIVE METABOLIC PANEL
ALT: 69 U/L — ABNORMAL HIGH (ref 0–53)
AST: 41 U/L — ABNORMAL HIGH (ref 0–37)
Albumin: 4.7 g/dL (ref 3.5–5.2)
Alkaline Phosphatase: 55 U/L (ref 39–117)
BUN: 11 mg/dL (ref 6–23)
CO2: 26 mEq/L (ref 19–32)
Calcium: 9.3 mg/dL (ref 8.4–10.5)
Chloride: 101 mEq/L (ref 96–112)
Creatinine, Ser: 0.79 mg/dL (ref 0.50–1.35)
Glucose, Bld: 85 mg/dL (ref 70–99)
Potassium: 4.1 mEq/L (ref 3.5–5.3)
Sodium: 134 mEq/L — ABNORMAL LOW (ref 135–145)
Total Bilirubin: 1 mg/dL (ref 0.2–1.2)
Total Protein: 6.7 g/dL (ref 6.0–8.3)

## 2013-06-04 LAB — LACTATE DEHYDROGENASE: LDH: 322 U/L — ABNORMAL HIGH (ref 94–250)

## 2013-06-04 LAB — CBC WITH DIFFERENTIAL (CANCER CENTER ONLY)
BASO#: 0.2 10*3/uL (ref 0.0–0.2)
BASO%: 2.2 % — ABNORMAL HIGH (ref 0.0–2.0)
EOS%: 3.5 % (ref 0.0–7.0)
Eosinophils Absolute: 0.3 10*3/uL (ref 0.0–0.5)
HCT: 48.2 % (ref 38.7–49.9)
HGB: 16.6 g/dL (ref 13.0–17.1)
LYMPH#: 1.1 10*3/uL (ref 0.9–3.3)
LYMPH%: 14.6 % (ref 14.0–48.0)
MCH: 33.3 pg (ref 28.0–33.4)
MCHC: 34.4 g/dL (ref 32.0–35.9)
MCV: 97 fL (ref 82–98)
MONO#: 0.6 10*3/uL (ref 0.1–0.9)
MONO%: 8.7 % (ref 0.0–13.0)
NEUT#: 5.2 10*3/uL (ref 1.5–6.5)
NEUT%: 71 % (ref 40.0–80.0)
Platelets: 480 10*3/uL — ABNORMAL HIGH (ref 145–400)
RBC: 4.99 10*6/uL (ref 4.20–5.70)
RDW: 13.9 % (ref 11.1–15.7)
WBC: 7.4 10*3/uL (ref 4.0–10.0)

## 2013-06-04 NOTE — Patient Instructions (Signed)

## 2013-06-04 NOTE — Progress Notes (Signed)
This office note has been dictated.

## 2013-06-05 LAB — IRON AND TIBC CHCC
%SAT: 13 % — ABNORMAL LOW (ref 20–55)
Iron: 60 ug/dL (ref 42–163)
TIBC: 454 ug/dL — ABNORMAL HIGH (ref 202–409)
UIBC: 394 ug/dL — ABNORMAL HIGH (ref 117–376)

## 2013-06-05 LAB — FERRITIN CHCC: Ferritin: 13 ng/ml — ABNORMAL LOW (ref 22–316)

## 2013-06-05 NOTE — Progress Notes (Signed)
CC:   Ermalene Searing. Philip Aspen, M.D.  DIAGNOSIS:  Polycythemia vera - JAK2 positive.  CURRENT THERAPY: 1. Hydrea 500 mg p.o. daily. 2. Aspirin 181 mg p.o. daily. 3. Phlebotomy to maintain hematocrit below 45%.  INTERIM HISTORY:  [name] [name] comes in for followup.  We last saw him back in December.  Since then, he has been doing okay.  He gained a little weight over the holidays.  He is not too happy about that part.  When we saw him in December, his ferritin was 57 with an iron saturation of 39%.  We cut his Hydrea dose back a little bit when we saw him at that point in time.  He has had no headache.  He has had some shortness of breath.  He feels this probably is from him gaining a little weight.  He has had no bleeding.  He has had no fever.  He has had no dysphagia or odynophagia.  He has had no change in bowel or bladder habits.  He has not had any issues with palpitations.  He has had no leg swelling.  PHYSICAL EXAMINATION:  General:  This is a well-developed, well- nourished white gentleman in no obvious distress.  Vital Signs: Temperature of 98.2, pulse 73, respiratory rate 18, blood pressure 136/81, and weight is 208 pounds.  Head and Neck:  Normocephalic and atraumatic skull.  There are no ocular or oral lesions.  There are no palpable cervical or supraclavicular lymph nodes.  Lungs:  Clear bilaterally.  Cardiac:  Regular rate and rhythm with a normal S1 and S2. There are no murmurs, rubs, or bruits.  Abdomen:  Soft.  He has good bowel sounds.  There is no fluid wave.  There is no palpable hepatomegaly.  Spleen tip is not palpable.  Back:  No tenderness over the spine, ribs, or hips.  Extremities:  No clubbing, cyanosis, or edema.  He has good range motion of his joints.  He has good muscle strength in upper and lower extremities.  Back:  No tenderness over the spine, ribs, or hips.  LABORATORY DATA:  White cell count 7.4, hemoglobin 16.6, hematocrit 48.2, and platelet  count 480.  MCV is 97.  IMPRESSION:  Mr. Choquette is a 58 year old gentleman.  He has polycythemia vera.  He is JAK2 positive.  We will go ahead and phlebotomize him.  We also will need to increase his Hydrea dose again.  We will get him back to 500 mg a day alternating with 1000 mg a day.  This was pretty effective for him.  I though we could try to cut his dose back a little bit just to make it little bit easier with taking medications.  We will plan to get him back in 6 weeks' time now.  I just want to make sure that we keep staying aggressive with his phlebotomies.    ______________________________ [name], M.D. PRE/MEDQ  D:  06/04/2013  T:  06/05/2013  Job:  [id number]

## 2013-06-12 ENCOUNTER — Encounter: Payer: Self-pay | Admitting: Internal Medicine

## 2013-06-20 ENCOUNTER — Other Ambulatory Visit: Payer: Self-pay | Admitting: Internal Medicine

## 2013-06-22 ENCOUNTER — Other Ambulatory Visit: Payer: Self-pay

## 2013-06-22 MED ORDER — CILIDINIUM-CHLORDIAZEPOXIDE 2.5-5 MG PO CAPS: 1.0000 | ORAL_CAPSULE | Freq: Three times a day (TID) | ORAL | Status: DC

## 2013-06-24 ENCOUNTER — Other Ambulatory Visit: Payer: Self-pay

## 2013-06-24 ENCOUNTER — Encounter: Payer: Self-pay | Admitting: Internal Medicine

## 2013-06-24 MED ORDER — CILIDINIUM-CHLORDIAZEPOXIDE 2.5-5 MG PO CAPS: 1.0000 | ORAL_CAPSULE | Freq: Three times a day (TID) | ORAL | Status: DC

## 2013-06-24 NOTE — Telephone Encounter (Signed)
Denied Librax , needs office visit.  Last seen May 2013.

## 2013-07-16 ENCOUNTER — Ambulatory Visit (HOSPITAL_BASED_OUTPATIENT_CLINIC_OR_DEPARTMENT_OTHER): Payer: Managed Care, Other (non HMO) | Admitting: Hematology & Oncology

## 2013-07-16 ENCOUNTER — Ambulatory Visit (HOSPITAL_BASED_OUTPATIENT_CLINIC_OR_DEPARTMENT_OTHER): Payer: Managed Care, Other (non HMO)

## 2013-07-16 ENCOUNTER — Other Ambulatory Visit (HOSPITAL_BASED_OUTPATIENT_CLINIC_OR_DEPARTMENT_OTHER): Payer: Managed Care, Other (non HMO) | Admitting: Lab

## 2013-07-16 ENCOUNTER — Encounter: Payer: Self-pay | Admitting: Hematology & Oncology

## 2013-07-16 VITALS — BP 135/82 | HR 61 | Temp 97.5°F | Resp 18 | Ht 70.0 in | Wt 208.0 lb

## 2013-07-16 VITALS — BP 141/93 | HR 56 | Resp 20

## 2013-07-16 DIAGNOSIS — D45 Polycythemia vera: Secondary | ICD-10-CM

## 2013-07-16 DIAGNOSIS — D751 Secondary polycythemia: Secondary | ICD-10-CM

## 2013-07-16 LAB — CBC WITH DIFFERENTIAL (CANCER CENTER ONLY)
BASO#: 0.2 10*3/uL (ref 0.0–0.2)
BASO%: 3.1 % — ABNORMAL HIGH (ref 0.0–2.0)
EOS%: 4 % (ref 0.0–7.0)
Eosinophils Absolute: 0.3 10*3/uL (ref 0.0–0.5)
HCT: 48.7 % (ref 38.7–49.9)
HGB: 16.1 g/dL (ref 13.0–17.1)
LYMPH#: 1.3 10*3/uL (ref 0.9–3.3)
LYMPH%: 17.6 % (ref 14.0–48.0)
MCH: 29 pg (ref 28.0–33.4)
MCHC: 33.1 g/dL (ref 32.0–35.9)
MCV: 88 fL (ref 82–98)
MONO#: 0.5 10*3/uL (ref 0.1–0.9)
MONO%: 7.2 % (ref 0.0–13.0)
NEUT#: 5.1 10*3/uL (ref 1.5–6.5)
NEUT%: 68.1 % (ref 40.0–80.0)
Platelets: 396 10*3/uL (ref 145–400)
RBC: 5.56 10*6/uL (ref 4.20–5.70)
RDW: 15.8 % — ABNORMAL HIGH (ref 11.1–15.7)
WBC: 7.5 10*3/uL (ref 4.0–10.0)

## 2013-07-16 NOTE — Progress Notes (Signed)
[name] [name] presents today for phlebotomy per MD orders. Phlebotomy procedure started at 1545 and ended at 1625. 500 mls  removed. Patient observed for 30 minutes after procedure without any incident. Patient tolerated procedure well. IV needle removed intact.

## 2013-07-16 NOTE — Patient Instructions (Signed)

## 2013-07-16 NOTE — Progress Notes (Signed)
Hematology and Oncology Follow Up Visit  [name] [name] [id number] 1956-01-28 58 y.o. 07/16/2013   Principle Diagnosis:   Polycythemia vera- JAK2 (+)  Current Therapy:    Phlebotomy to maintain hematocrit below 45%  Hydrea 500 mg by mouth daily alternating with 1000 mg by mouth daily  Aspirin 81 mg by mouth daily     Interim History:  Mr.  [name] [name] is in for followup. He is doing well. He's had no problems with headache. There is no abdominal pain. Is having no cough. There is no leg swelling. He's had no rashes.  He still working. He's had no fatigue or weakness. He's had no nausea or vomiting. He's had no arthralgias or myalgias.  We are mayhem iron deficient. His last ferritin was 13 back in January.      Medications: Current outpatient prescriptions:acetaminophen (TYLENOL) 500 MG tablet, Take 1,000 mg by mouth every 6 (six) hours as needed. For pain , Disp: , Rfl: ;  aspirin 81 MG tablet, Take 81 mg by mouth daily.  , Disp: , Rfl: ;  clidinium-chlordiazePOXIDE (LIBRAX) 5-2.5 MG per capsule, Take 1 capsule by mouth as needed., Disp: , Rfl: ;  hydroxyurea (HYDREA) 500 MG capsule, TAKE 1 CAPSULES BY MOUTH EVERY DAY, Disp: , Rfl:  hyoscyamine (LEVBID) 0.375 MG 12 hr tablet, TAKE 1 TABLET BY MOUTH TWICE A DAY AS NEEDED FOR CRAMPS AND LOOSE STOOLS, Disp: 60 tablet, Rfl: 1;  meloxicam (MOBIC) 15 MG tablet, Take 1 tablet (15 mg total) by mouth daily., Disp: 30 tablet, Rfl: 6;  propranolol (INDERAL) 10 MG tablet, Take 10 mg by mouth as needed., Disp: , Rfl: ;  propranolol ER (INDERAL LA) 80 MG 24 hr capsule, TAKE 1 CAPSULE (80 MG TOTAL) BY MOUTH DAILY., Disp: 30 capsule, Rfl: 3 psyllium (METAMUCIL) 58.6 % powder, Take 1 packet by mouth daily., Disp: , Rfl: ;  traZODone (DESYREL) 50 MG tablet, Take 50 mg by mouth at bedtime as needed. , Disp: , Rfl: ;  zolpidem (AMBIEN CR) 12.5 MG CR tablet, Take by mouth as needed. For sleep pt usually takes 1/2 tablet, Disp: , Rfl: ;  [DISCONTINUED]  propranolol (INNOPRAN XL) 80 MG 24 hr capsule, Take 1 capsule (80 mg total) by mouth at bedtime., Disp: 30 capsule, Rfl: 0  Allergies: No Known Allergies  Past Medical History, Surgical history, Social history, and Family History were reviewed and updated.  Review of Systems: As above  Physical Exam:  height is 5\' 10"  (1.778 m) and weight is 208 lb (94.348 kg). His oral temperature is 97.5 F (36.4 C). His blood pressure is 135/82 and his pulse is 61. His respiration is 18.   Lungs are clear. Cardiac exam regular in rhythm. Abdomen soft. No fluid wave. No palpable spleen. Liver is non-palpable. Head and neck exam shows no adenopathy. No oral lesions. Extremities no clubbing cyanosis or edema. Neurological exam no focal neurological deficits. Skin exam no rashes.  Lab Results  Component Value Date   WBC 7.5 07/16/2013   HGB 16.1 07/16/2013   HCT 48.7 07/16/2013   MCV 88 07/16/2013   PLT 396 07/16/2013     Chemistry      Component Value Date/Time   NA 134* 06/04/2013 1440   NA 138 04/16/2013 1418   K 4.1 06/04/2013 1440   K 3.7 04/16/2013 1418   CL 101 06/04/2013 1440   CL 102 04/16/2013 1418   CO2 26 06/04/2013 1440   CO2 32 04/16/2013 1418   BUN 11  06/04/2013 1440   BUN 11 04/16/2013 1418   CREATININE 0.79 06/04/2013 1440   CREATININE 0.9 04/16/2013 1418      Component Value Date/Time   CALCIUM 9.3 06/04/2013 1440   CALCIUM 8.6 04/16/2013 1418   ALKPHOS 55 06/04/2013 1440   ALKPHOS 51 04/16/2013 1418   AST 41* 06/04/2013 1440   AST 32 04/16/2013 1418   ALT 69* 06/04/2013 1440   ALT 38 04/16/2013 1418   BILITOT 1.0 06/04/2013 1440   BILITOT 1.40 04/16/2013 1418         Impression and Plan: [name] is a 58 year old gentleman. He has a polycythemia. He is a well with Hydrea.  We will phlebotomize him today. This always helps.  I will not change his Hydrea dose.  I'll plan to get him back to see me in 6 weeks.   [name], MD 3/12/20153:38 PM

## 2013-07-17 ENCOUNTER — Telehealth: Payer: Self-pay | Admitting: Hematology & Oncology

## 2013-07-17 LAB — FERRITIN CHCC: Ferritin: 11 ng/ml — ABNORMAL LOW (ref 22–316)

## 2013-07-17 NOTE — Telephone Encounter (Signed)
Pt moved 4-23 to 5-1 he is going out of town. We couldn't find other dates earlier he could do.

## 2013-07-21 ENCOUNTER — Other Ambulatory Visit: Payer: Self-pay

## 2013-07-21 MED ORDER — PROPRANOLOL HCL ER 80 MG PO CP24: ORAL_CAPSULE | ORAL | Status: DC

## 2013-07-27 ENCOUNTER — Ambulatory Visit (AMBULATORY_SURGERY_CENTER): Payer: Self-pay | Admitting: *Deleted

## 2013-07-27 VITALS — Ht 71.0 in | Wt 208.0 lb

## 2013-07-27 DIAGNOSIS — Z86010 Personal history of colonic polyps: Secondary | ICD-10-CM

## 2013-07-27 MED ORDER — MOVIPREP 100 G PO SOLR: ORAL | Status: DC

## 2013-07-27 NOTE — Progress Notes (Signed)
Patient denies any allergies to eggs or soy. Patient denies any problems with anesthesia.  

## 2013-07-29 ENCOUNTER — Encounter: Payer: Self-pay | Admitting: Nurse Practitioner

## 2013-07-29 NOTE — Progress Notes (Signed)
[name] [name] presents today 06/04/13 (late entry) phlebotomy per MD orders. Phlebotomy procedure started at 1555 and ended at 1605. 500 grams removed. Patient observed for 30 minutes after procedure without any incident. Patient tolerated procedure well. IV needle removed intact.

## 2013-08-04 ENCOUNTER — Encounter: Payer: Self-pay | Admitting: Internal Medicine

## 2013-08-10 ENCOUNTER — Ambulatory Visit (AMBULATORY_SURGERY_CENTER): Payer: Managed Care, Other (non HMO) | Admitting: Internal Medicine

## 2013-08-10 ENCOUNTER — Encounter: Payer: Self-pay | Admitting: Internal Medicine

## 2013-08-10 VITALS — BP 130/83 | HR 59 | Temp 98.3°F | Resp 16 | Ht 71.0 in | Wt 208.0 lb

## 2013-08-10 DIAGNOSIS — Z86010 Personal history of colonic polyps: Secondary | ICD-10-CM

## 2013-08-10 MED ORDER — SODIUM CHLORIDE 0.9 % IV SOLN: 500.0000 mL | INTRAVENOUS | Status: DC

## 2013-08-10 NOTE — Patient Instructions (Signed)
YOU HAD AN ENDOSCOPIC PROCEDURE TODAY AT [facility]: Refer to the procedure report that was given to you for any specific questions about what was found during the examination.  If the procedure report does not answer your questions, please call your gastroenterologist to clarify.  If you requested that your care partner not be given the details of your procedure findings, then the procedure report has been included in a sealed envelope for you to review at your convenience later.  YOU SHOULD EXPECT: Some feelings of bloating in the abdomen. Passage of more gas than usual.  Walking can help get rid of the air that was put into your GI tract during the procedure and reduce the bloating. If you had a lower endoscopy (such as a colonoscopy or flexible sigmoidoscopy) you may notice spotting of blood in your stool or on the toilet paper. If you underwent a bowel prep for your procedure, then you may not have a normal bowel movement for a few days.  DIET: Your first meal following the procedure should be a light meal and then it is ok to progress to your normal diet.  A half-sandwich or bowl of soup is an example of a good first meal.  Heavy or fried foods are harder to digest and may make you feel nauseous or bloated.  Likewise meals heavy in dairy and vegetables can cause extra gas to form and this can also increase the bloating.  Drink plenty of fluids but you should avoid alcoholic beverages for 24 hours.  Try to eat a high fiber fdiet to help prevention of Diverticulitis.  ACTIVITY: Your care partner should take you home directly after the procedure.  You should plan to take it easy, moving slowly for the rest of the day.  You can resume normal activity the day after the procedure however you should NOT DRIVE or use heavy machinery for 24 hours (because of the sedation medicines used during the test).    SYMPTOMS TO REPORT IMMEDIATELY: A gastroenterologist can be reached at any hour.   During normal business hours, 8:30 AM to 5:00 PM Monday through Friday, call [phone].  After hours and on weekends, please call the GI answering service at [phone] who will take a message and have the physician on call contact you.   Following lower endoscopy (colonoscopy or flexible sigmoidoscopy):  Excessive amounts of blood in the stool  Significant tenderness or worsening of abdominal pains  Swelling of the abdomen that is new, acute  Fever of 100F or higher  FOLLOW UP: If any biopsies were taken you will be contacted by phone or by letter within the next 1-3 weeks.  Call your gastroenterologist if you have not heard about the biopsies in 3 weeks.  Our staff will call the home number listed on your records the next business day following your procedure to check on you and address any questions or concerns that you may have at that time regarding the information given to you following your procedure. This is a courtesy call and so if there is no answer at the home number and we have not heard from you through the emergency physician on call, we will assume that you have returned to your regular daily activities without incident.  SIGNATURES/CONFIDENTIALITY: You and/or your care partner have signed paperwork which will be entered into your electronic medical record.  These signatures attest to the fact that that the information above on your After Visit Summary has  been reviewed and is understood.  Full responsibility of the confidentiality of this discharge information lies with you and/or your care-partner.

## 2013-08-10 NOTE — Op Note (Signed)
[facility]  [address] [place], [address]   COLONOSCOPY PROCEDURE REPORT  PATIENT: [name] [name], [name] [name]  MR#: [id number] BIRTHDATE: 18-Apr-1956 , 57  yrs. old GENDER: Male ENDOSCOPIST: Eustace Quail, MD REFERRED OM:[id number] Program Recall PROCEDURE DATE:  08/10/2013 PROCEDURE:   Colonoscopy, surveillance First Screening Colonoscopy - Avg.  risk and is 50 yrs.  old or older - No.  Prior Negative Screening - Now for repeat screening. N/A  History of Adenoma - Now for follow-up colonoscopy & has been > or = to 3 yrs.  Yes hx of adenoma.  Has been 3 or more years since last colonoscopy.  Polyps Removed Today? No.  Recommend repeat exam, <10 yrs? No. ASA CLASS:   Class II INDICATIONS:Patient's personal history of adenomatous colon polyps. Index exam February 2010 with diminutive adenoma. MEDICATIONS: MAC sedation, administered by CRNA and propofol (Diprivan) 200mg  IV  DESCRIPTION OF PROCEDURE:   After the risks benefits and alternatives of the procedure were thoroughly explained, informed consent was obtained.  A digital rectal exam revealed no abnormalities of the rectum.   The LB [id number]  endoscope was introduced through the anus and advanced to the cecum, which was identified by both the appendix and ileocecal valve. No adverse events experienced.   The quality of the prep was excellent, using MoviPrep  The instrument was then slowly withdrawn as the colon was fully examined.    COLON FINDINGS: Moderate diverticulosis was noted The finding was in the left colon.   The colon was otherwise normal.  There was no inflammation, polyps or cancers unless previously stated. Retroflexed views revealed internal hemorrhoids. The time to cecum=1 minutes 28 seconds.  Withdrawal time=7 minutes 52 seconds. The scope was withdrawn and the procedure completed. COMPLICATIONS: There were no complications.  ENDOSCOPIC IMPRESSION: 1.   Moderate diverticulosis was  noted in the left colon 2.   The colon was otherwise normal  RECOMMENDATIONS: 1. Continue current colorectal surveillance recommendations with a repeat colonoscopy in 10 years.   eSigned:  Eustace Quail, MD 08/10/2013 12:25 PM   cc: Leanna Battles, MD and The Patient

## 2013-08-10 NOTE — Progress Notes (Signed)
Procedure ends, to recovery, report given and VSS. 

## 2013-08-11 ENCOUNTER — Telehealth: Payer: Self-pay

## 2013-08-11 NOTE — Telephone Encounter (Signed)
  Follow up Call-  Call back number 08/10/2013  Post procedure Call Back phone  # [phone] cell  Permission to leave phone message Yes     Patient questions:  Do you have a fever, pain , or abdominal swelling? no Pain Score  0 *  Have you tolerated food without any problems? yes  Have you been able to return to your normal activities? yes  Do you have any questions about your discharge instructions: Diet   no Medications  no Follow up visit  no  Do you have questions or concerns about your Care? no  Actions: * If pain score is 4 or above: No action needed, pain <4.  Per the pt "everyone treated me very well".  No problems noted. Maw

## 2013-08-12 ENCOUNTER — Telehealth: Payer: Self-pay | Admitting: Internal Medicine

## 2013-08-12 MED ORDER — CILIDINIUM-CHLORDIAZEPOXIDE 2.5-5 MG PO CAPS: 1.0000 | ORAL_CAPSULE | ORAL | Status: DC | PRN

## 2013-08-12 MED ORDER — HYOSCYAMINE SULFATE ER 0.375 MG PO TB12: 0.3750 mg | ORAL_TABLET | Freq: Two times a day (BID) | ORAL | Status: DC

## 2013-08-12 NOTE — Telephone Encounter (Signed)
Refilled Levbid and Librax

## 2013-08-27 ENCOUNTER — Other Ambulatory Visit: Payer: Managed Care, Other (non HMO) | Admitting: Lab

## 2013-08-27 ENCOUNTER — Ambulatory Visit: Payer: Managed Care, Other (non HMO) | Admitting: Hematology & Oncology

## 2013-09-04 ENCOUNTER — Ambulatory Visit (HOSPITAL_BASED_OUTPATIENT_CLINIC_OR_DEPARTMENT_OTHER): Payer: Managed Care, Other (non HMO)

## 2013-09-04 ENCOUNTER — Encounter: Payer: Self-pay | Admitting: Hematology & Oncology

## 2013-09-04 ENCOUNTER — Other Ambulatory Visit (HOSPITAL_BASED_OUTPATIENT_CLINIC_OR_DEPARTMENT_OTHER): Payer: Managed Care, Other (non HMO) | Admitting: Lab

## 2013-09-04 ENCOUNTER — Ambulatory Visit (HOSPITAL_BASED_OUTPATIENT_CLINIC_OR_DEPARTMENT_OTHER): Payer: Managed Care, Other (non HMO) | Admitting: Hematology & Oncology

## 2013-09-04 VITALS — BP 129/83 | HR 65 | Temp 98.1°F | Resp 18 | Ht 70.0 in | Wt 207.0 lb

## 2013-09-04 VITALS — BP 143/93 | HR 63 | Temp 97.1°F | Resp 16

## 2013-09-04 DIAGNOSIS — D751 Secondary polycythemia: Secondary | ICD-10-CM

## 2013-09-04 DIAGNOSIS — D45 Polycythemia vera: Secondary | ICD-10-CM

## 2013-09-04 LAB — CBC WITH DIFFERENTIAL (CANCER CENTER ONLY)
BASO#: 0.3 10*3/uL — ABNORMAL HIGH (ref 0.0–0.2)
BASO%: 3.3 % — ABNORMAL HIGH (ref 0.0–2.0)
EOS%: 4.1 % (ref 0.0–7.0)
Eosinophils Absolute: 0.3 10*3/uL (ref 0.0–0.5)
HCT: 48.8 % (ref 38.7–49.9)
HGB: 15.8 g/dL (ref 13.0–17.1)
LYMPH#: 1.4 10*3/uL (ref 0.9–3.3)
LYMPH%: 16.4 % (ref 14.0–48.0)
MCH: 26.5 pg — ABNORMAL LOW (ref 28.0–33.4)
MCHC: 32.4 g/dL (ref 32.0–35.9)
MCV: 82 fL (ref 82–98)
MONO#: 0.7 10*3/uL (ref 0.1–0.9)
MONO%: 8.5 % (ref 0.0–13.0)
NEUT#: 5.6 10*3/uL (ref 1.5–6.5)
NEUT%: 67.7 % (ref 40.0–80.0)
Platelets: 379 10*3/uL (ref 145–400)
RBC: 5.96 10*6/uL — ABNORMAL HIGH (ref 4.20–5.70)
RDW: 16.6 % — ABNORMAL HIGH (ref 11.1–15.7)
WBC: 8.3 10*3/uL (ref 4.0–10.0)

## 2013-09-04 LAB — CMP (CANCER CENTER ONLY)
ALT(SGPT): 36 U/L (ref 10–47)
AST: 35 U/L (ref 11–38)
Albumin: 4 g/dL (ref 3.3–5.5)
Alkaline Phosphatase: 46 U/L (ref 26–84)
BUN, Bld: 13 mg/dL (ref 7–22)
CO2: 32 mEq/L (ref 18–33)
Calcium: 9.1 mg/dL (ref 8.0–10.3)
Chloride: 101 mEq/L (ref 98–108)
Creat: 1.2 mg/dl (ref 0.6–1.2)
Glucose, Bld: 85 mg/dL (ref 73–118)
Potassium: 4 mEq/L (ref 3.3–4.7)
Sodium: 138 mEq/L (ref 128–145)
Total Bilirubin: 0.9 mg/dl (ref 0.20–1.60)
Total Protein: 6.7 g/dL (ref 6.4–8.1)

## 2013-09-04 LAB — LACTATE DEHYDROGENASE: LDH: 219 U/L (ref 94–250)

## 2013-09-04 NOTE — Progress Notes (Signed)
[name] [name] presents today for phlebotomy per MD orders. Phlebotomy procedure started at 1555 and ended at 1605. 585mL removed. Patient observed for 30 minutes after procedure without any incident. Patient tolerated procedure well. IV needle removed intact.

## 2013-09-04 NOTE — Patient Instructions (Signed)

## 2013-09-05 NOTE — Progress Notes (Signed)
Hematology and Oncology Follow Up Visit  Tosh Glaze [id number] [birth year] 58 y.o. 09/05/2013   Principle Diagnosis:  Polycythemia vera- JAK2 (+)  Current Therapy:    Phlebotomy to maintain hematocrit below 45%  Hydrea 500 mg by mouth daily alternating with 1000 mg by mouth daily  Aspirin 81 mg by mouth daily     Interim History:  Mr.  Paolini is back for followup. He's doing okay. He's had no specific complaints. He's had no nausea vomiting. His headache. He's had a skin parotids. There's been no change in bowel or bladder habits. He's had no fever. Generally swollen. He's had no cough.  Medications: Current outpatient prescriptions:acetaminophen (TYLENOL) 500 MG tablet, Take 1,000 mg by mouth every 6 (six) hours as needed. For pain , Disp: , Rfl: ;  aspirin 81 MG tablet, Take 81 mg by mouth daily.  , Disp: , Rfl: ;  clidinium-chlordiazePOXIDE (LIBRAX) 5-2.5 MG per capsule, Take 1 capsule by mouth as needed., Disp: 60 capsule, Rfl: 1;  diphenhydrAMINE (BENADRYL) 25 MG tablet, Take 25 mg by mouth as needed., Disp: , Rfl:  diphenoxylate-atropine (LOMOTIL) 2.5-0.025 MG per tablet, Take 1 tablet by mouth as needed for diarrhea or loose stools., Disp: , Rfl: ;  doxycycline (VIBRAMYCIN) 100 MG capsule, Take 100 mg by mouth daily. , Disp: , Rfl: ;  hydroxyurea (HYDREA) 500 MG capsule, TAKE 1 CAPSULES BY MOUTH EVERY DAY, Disp: , Rfl: ;  hyoscyamine (LEVBID) 0.375 MG 12 hr tablet, Take 0.375 mg by mouth as needed., Disp: , Rfl:  meloxicam (MOBIC) 15 MG tablet, Take 15 mg by mouth as needed., Disp: , Rfl: ;  Naproxen Sod-Diphenhydramine (ALEVE PM PO), Take 0.5 tablets by mouth as needed., Disp: , Rfl: ;  propranolol ER (INDERAL LA) 80 MG 24 hr capsule, TAKE 1 CAPSULE (80 MG TOTAL) BY MOUTH DAILY., Disp: 30 capsule, Rfl: 3;  psyllium (METAMUCIL) 58.6 % powder, Take 1 packet by mouth daily., Disp: , Rfl:  TOBRADEX ophthalmic ointment, Place 1 application into the right eye 2 (two) times daily. , Disp: ,  Rfl: ;  zolpidem (AMBIEN CR) 12.5 MG CR tablet, Take by mouth as needed. For sleep pt usually takes 1/2 tablet, Disp: , Rfl: ;  [DISCONTINUED] propranolol (INNOPRAN XL) 80 MG 24 hr capsule, Take 1 capsule (80 mg total) by mouth at bedtime., Disp: 30 capsule, Rfl: 0  Allergies: No Known Allergies  Past Medical History, Surgical history, Social history, and Family History were reviewed and updated.  Review of Systems: As above  Physical Exam:  height is 5\' 10"  (1.778 m) and weight is 207 lb (93.895 kg). His oral temperature is 98.1 F (36.7 C). His blood pressure is 129/83 and his pulse is 65. His respiration is 18.   Well-developed and well-nourished white gentleman. Lungs are clear. Cardiac exam regular in rhythm with no murmurs rubs or bruits. Abdomen soft. Has good bowel sounds. There is no fluid wave. There is a palpable liver or spleen tip. Back exam no tenderness over the spine ribs or hips. Extremities shows no clubbing cyanosis or edema. Skin exam no rashes ecchymosis or petechia. Neurological exam is nonfocal.  Lab Results  Component Value Date   WBC 8.3 09/04/2013   HGB 15.8 09/04/2013   HCT 48.8 09/04/2013   MCV 82 09/04/2013   PLT 379 09/04/2013     Chemistry      Component Value Date/Time   NA 138 09/04/2013 1442   NA 134* 06/04/2013 1440   K 4.0  09/04/2013 1442   K 4.1 06/04/2013 1440   CL 101 09/04/2013 1442   CL 101 06/04/2013 1440   CO2 32 09/04/2013 1442   CO2 26 06/04/2013 1440   BUN 13 09/04/2013 1442   BUN 11 06/04/2013 1440   CREATININE 1.2 09/04/2013 1442   CREATININE 0.79 06/04/2013 1440      Component Value Date/Time   CALCIUM 9.1 09/04/2013 1442   CALCIUM 9.3 06/04/2013 1440   ALKPHOS 46 09/04/2013 1442   ALKPHOS 55 06/04/2013 1440   AST 35 09/04/2013 1442   AST 41* 06/04/2013 1440   ALT 36 09/04/2013 1442   ALT 69* 06/04/2013 1440   BILITOT 0.90 09/04/2013 1442   BILITOT 1.0 06/04/2013 1440         Impression and Plan: Mr. Dilone is 58 year old gentleman with polycythemia.  He's doing well with his. He's had no complications from this.  We will go ahead and phlebotomize him today.  He continues on Hydrea. He's done well with this. We will have to watch his response continually. It is possible that if we see that his hemoglobin is pending further upward, we might have to consider switching him over to [facility].  We will go ahead and plan to get him back in another 4-6 weeks.   [name], MD 5/2/20156:59 AM

## 2013-09-07 LAB — FERRITIN CHCC: Ferritin: 7 ng/ml — ABNORMAL LOW (ref 22–316)

## 2013-09-15 ENCOUNTER — Ambulatory Visit: Payer: Managed Care, Other (non HMO) | Admitting: Internal Medicine

## 2013-09-17 ENCOUNTER — Other Ambulatory Visit: Payer: Managed Care, Other (non HMO) | Admitting: Lab

## 2013-09-17 ENCOUNTER — Ambulatory Visit: Payer: Managed Care, Other (non HMO) | Admitting: Hematology & Oncology

## 2013-10-15 ENCOUNTER — Other Ambulatory Visit (HOSPITAL_BASED_OUTPATIENT_CLINIC_OR_DEPARTMENT_OTHER): Payer: Managed Care, Other (non HMO) | Admitting: Lab

## 2013-10-15 ENCOUNTER — Encounter: Payer: Self-pay | Admitting: Hematology & Oncology

## 2013-10-15 ENCOUNTER — Ambulatory Visit (HOSPITAL_BASED_OUTPATIENT_CLINIC_OR_DEPARTMENT_OTHER): Payer: Managed Care, Other (non HMO) | Admitting: Hematology & Oncology

## 2013-10-15 ENCOUNTER — Telehealth: Payer: Self-pay | Admitting: Hematology & Oncology

## 2013-10-15 ENCOUNTER — Ambulatory Visit (HOSPITAL_BASED_OUTPATIENT_CLINIC_OR_DEPARTMENT_OTHER): Payer: Managed Care, Other (non HMO)

## 2013-10-15 VITALS — BP 121/74 | HR 70 | Temp 98.3°F | Resp 18 | Ht 70.0 in | Wt 207.0 lb

## 2013-10-15 DIAGNOSIS — H0019 Chalazion unspecified eye, unspecified eyelid: Secondary | ICD-10-CM

## 2013-10-15 DIAGNOSIS — D751 Secondary polycythemia: Secondary | ICD-10-CM

## 2013-10-15 DIAGNOSIS — D45 Polycythemia vera: Secondary | ICD-10-CM

## 2013-10-15 LAB — CMP (CANCER CENTER ONLY)
ALT(SGPT): 36 U/L (ref 10–47)
AST: 35 U/L (ref 11–38)
Albumin: 4 g/dL (ref 3.3–5.5)
Alkaline Phosphatase: 48 U/L (ref 26–84)
BUN, Bld: 12 mg/dL (ref 7–22)
CO2: 29 mEq/L (ref 18–33)
Calcium: 8.9 mg/dL (ref 8.0–10.3)
Chloride: 102 mEq/L (ref 98–108)
Creat: 1.1 mg/dl (ref 0.6–1.2)
Glucose, Bld: 97 mg/dL (ref 73–118)
Potassium: 3.7 mEq/L (ref 3.3–4.7)
Sodium: 139 mEq/L (ref 128–145)
Total Bilirubin: 1.1 mg/dl (ref 0.20–1.60)
Total Protein: 6.9 g/dL (ref 6.4–8.1)

## 2013-10-15 LAB — CBC WITH DIFFERENTIAL (CANCER CENTER ONLY)
BASO#: 0.3 10*3/uL — ABNORMAL HIGH (ref 0.0–0.2)
BASO%: 2.9 % — ABNORMAL HIGH (ref 0.0–2.0)
EOS%: 4.5 % (ref 0.0–7.0)
Eosinophils Absolute: 0.4 10*3/uL (ref 0.0–0.5)
HCT: 47.5 % (ref 38.7–49.9)
HGB: 15.4 g/dL (ref 13.0–17.1)
LYMPH#: 1.3 10*3/uL (ref 0.9–3.3)
LYMPH%: 13.5 % — ABNORMAL LOW (ref 14.0–48.0)
MCH: 25.1 pg — ABNORMAL LOW (ref 28.0–33.4)
MCHC: 32.4 g/dL (ref 32.0–35.9)
MCV: 78 fL — ABNORMAL LOW (ref 82–98)
MONO#: 0.7 10*3/uL (ref 0.1–0.9)
MONO%: 7.1 % (ref 0.0–13.0)
NEUT#: 6.7 10*3/uL — ABNORMAL HIGH (ref 1.5–6.5)
NEUT%: 72 % (ref 40.0–80.0)
Platelets: 521 10*3/uL — ABNORMAL HIGH (ref 145–400)
RBC: 6.13 10*6/uL — ABNORMAL HIGH (ref 4.20–5.70)
RDW: 16.9 % — ABNORMAL HIGH (ref 11.1–15.7)
WBC: 9.3 10*3/uL (ref 4.0–10.0)

## 2013-10-15 LAB — LACTATE DEHYDROGENASE: LDH: 273 U/L — ABNORMAL HIGH (ref 94–250)

## 2013-10-15 NOTE — Telephone Encounter (Signed)
MD aware pt made 7-23 appointments which is 6 weeks

## 2013-10-15 NOTE — Progress Notes (Signed)
Hematology and Oncology Follow Up Visit  [name] [name] [id number] [birth year] 58 y.o. 10/15/2013   Principle Diagnosis:  Polycythemia vera- JAK2 (+)  Current Therapy:    Phlebotomy to maintain hematocrit below 45%  Hydrea 500 mg by mouth daily alternating with 1000 mg by mouth daily  Aspirin 81 mg by mouth daily     Interim History:  [name] [name] is back for followup. We saw him back in early May. He was phlebotomized then.  He's been doing okay. He has had quite a bit of stress. Work is becoming more of a problem for him. 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 This is a [place] a stressful job for him but he is managing quite well.  He's had problems with his right eyelid. He has a chalazion. He will see an ophthalmologist for this. History on some topical antibiotic.  He has had no bowel pain. He has had no cough or shortness of breath. There has been no positive bowels or bladder. He has had no rashes. He has had an occasional headache. Medications: Current outpatient prescriptions:acetaminophen (TYLENOL) 500 MG tablet, Take 1,000 mg by mouth every 6 (six) hours as needed. For pain , Disp: , Rfl: ;  aspirin 81 MG tablet, Take 81 mg by mouth daily.  , Disp: , Rfl: ;  clidinium-chlordiazePOXIDE (LIBRAX) 5-2.5 MG per capsule, Take 1 capsule by mouth as needed., Disp: 60 capsule, Rfl: 1;  diphenhydrAMINE (BENADRYL) 25 MG tablet, Take 25 mg by mouth as needed., Disp: , Rfl:  diphenhydramine-acetaminophen (TYLENOL PM) 25-500 MG TABS, Take 1 tablet by mouth at bedtime as needed., Disp: , Rfl: ;  diphenoxylate-atropine (LOMOTIL) 2.5-0.025 MG per tablet, Take 1 tablet by mouth as needed for diarrhea or loose stools., Disp: , Rfl: ;  hydroxyurea (HYDREA) 500 MG capsule, TAKE 1 CAPSULES BY MOUTH EVERY DAY, Disp: , Rfl: ;  hyoscyamine (LEVBID) 0.375 MG 12 hr tablet, Take 0.375 mg by mouth as needed., Disp: , Rfl:  meloxicam (MOBIC) 15 MG tablet, Take 15 mg by mouth as needed.,  Disp: , Rfl: ;  propranolol ER (INDERAL LA) 80 MG 24 hr capsule, TAKE 1 CAPSULE (80 MG TOTAL) BY MOUTH DAILY., Disp: 30 capsule, Rfl: 3;  psyllium (METAMUCIL) 58.6 % powder, Take 1 packet by mouth daily., Disp: , Rfl: ;  TOBRADEX ophthalmic ointment, Place 1 application into the right eye 2 (two) times daily. , Disp: , Rfl:  zolpidem (AMBIEN CR) 12.5 MG CR tablet, Take by mouth as needed. For sleep pt usually takes 1/2 tablet, Disp: , Rfl: ;  [DISCONTINUED] propranolol (INNOPRAN XL) 80 MG 24 hr capsule, Take 1 capsule (80 mg total) by mouth at bedtime., Disp: 30 capsule, Rfl: 0  Allergies: No Known Allergies  Past Medical History, Surgical history, Social history, and Family History were reviewed and updated.  Review of Systems: As above  Physical Exam:  height is 5\' 10"  (1.778 m) and weight is 207 lb (93.895 kg). His oral temperature is 98.3 F (36.8 C). His blood pressure is 121/74 and his pulse is 70. His respiration is 18.   Well-developed and well-nourished gentleman. There are no ocular or oral lesions. He does have a chalazion on the right eyelid. It is somewhat red. It is slightly tender. It probably measures 2 mm in size. There is no oral lesion. He has no adenopathy in the neck. Thyroid is nonpalpable. Lungs are clear. Cardiac exam regular rate and rhythm. There are no murmurs.  Abdomen is soft. Has good bowel sounds. There is no fluid wave. There is no palpable liver or spleen tip. Extremities shows no clubbing cyanosis or edema. Skin exam no rashes. Neurological exam no focal deficits.  Lab Results  Component Value Date   WBC 9.3 10/15/2013   HGB 15.4 10/15/2013   HCT 47.5 10/15/2013   MCV 78* 10/15/2013   PLT 521* 10/15/2013     Chemistry      Component Value Date/Time   NA 139 10/15/2013 1406   NA 134* 06/04/2013 1440   K 3.7 10/15/2013 1406   K 4.1 06/04/2013 1440   CL 102 10/15/2013 1406   CL 101 06/04/2013 1440   CO2 29 10/15/2013 1406   CO2 26 06/04/2013 1440   BUN 12 10/15/2013  1406   BUN 11 06/04/2013 1440   CREATININE 1.1 10/15/2013 1406   CREATININE 0.79 06/04/2013 1440      Component Value Date/Time   CALCIUM 8.9 10/15/2013 1406   CALCIUM 9.3 06/04/2013 1440   ALKPHOS 48 10/15/2013 1406   ALKPHOS 55 06/04/2013 1440   AST 35 10/15/2013 1406   AST 41* 06/04/2013 1440   ALT 36 10/15/2013 1406   ALT 69* 06/04/2013 1440   BILITOT 1.10 10/15/2013 1406   BILITOT 1.0 06/04/2013 1440         Impression and Plan: [name] [name] is 58 year old white male with polycythemia vera.  He will be phlebotomized.  Am not sure as to why the platelet count is so high. His platelet count is totally been fairly well controlled. I suppose this might be reflective of this chalazion. If there is infection or inflammation the platelets could go up.  I will not change his Hydrea dose for right now.  I do want him back in about a month.  If we are starting to see his blood counts becoming more difficult to control, we may have to consider readjusting our therapy and possibly putting him on a Percell Belt, MD 6/11/20153:53 PM

## 2013-10-15 NOTE — Patient Instructions (Signed)

## 2013-10-16 LAB — IRON AND TIBC CHCC
%SAT: 10 % — ABNORMAL LOW (ref 20–55)
Iron: 48 ug/dL (ref 42–163)
TIBC: 475 ug/dL — ABNORMAL HIGH (ref 202–409)
UIBC: 427 ug/dL — ABNORMAL HIGH (ref 117–376)

## 2013-10-16 LAB — FERRITIN CHCC: Ferritin: 8 ng/ml — ABNORMAL LOW (ref 22–316)

## 2013-11-20 ENCOUNTER — Other Ambulatory Visit: Payer: Self-pay | Admitting: *Deleted

## 2013-11-20 ENCOUNTER — Other Ambulatory Visit: Payer: Managed Care, Other (non HMO) | Admitting: Lab

## 2013-11-20 ENCOUNTER — Ambulatory Visit: Payer: Managed Care, Other (non HMO) | Admitting: Hematology & Oncology

## 2013-11-20 MED ORDER — PROPRANOLOL HCL ER 80 MG PO CP24: ORAL_CAPSULE | ORAL | Status: DC

## 2013-11-26 ENCOUNTER — Encounter: Payer: Self-pay | Admitting: Hematology & Oncology

## 2013-11-26 ENCOUNTER — Ambulatory Visit (HOSPITAL_BASED_OUTPATIENT_CLINIC_OR_DEPARTMENT_OTHER)
Admission: RE | Admit: 2013-11-26 | Discharge: 2013-11-26 | Disposition: A | Discharge status: Home / Self Care | Payer: Managed Care, Other (non HMO) | Source: Ambulatory Visit | Attending: Hematology & Oncology | Admitting: Hematology & Oncology

## 2013-11-26 ENCOUNTER — Other Ambulatory Visit (HOSPITAL_BASED_OUTPATIENT_CLINIC_OR_DEPARTMENT_OTHER): Payer: Managed Care, Other (non HMO) | Admitting: Lab

## 2013-11-26 ENCOUNTER — Ambulatory Visit (HOSPITAL_BASED_OUTPATIENT_CLINIC_OR_DEPARTMENT_OTHER): Payer: Managed Care, Other (non HMO) | Admitting: Hematology & Oncology

## 2013-11-26 ENCOUNTER — Telehealth: Payer: Self-pay | Admitting: *Deleted

## 2013-11-26 ENCOUNTER — Ambulatory Visit (HOSPITAL_BASED_OUTPATIENT_CLINIC_OR_DEPARTMENT_OTHER): Payer: Managed Care, Other (non HMO)

## 2013-11-26 VITALS — BP 131/91 | HR 57 | Resp 14

## 2013-11-26 VITALS — BP 132/86 | HR 57 | Temp 98.3°F | Resp 16 | Ht 70.0 in | Wt 205.0 lb

## 2013-11-26 DIAGNOSIS — M79605 Pain in left leg: Principal | ICD-10-CM

## 2013-11-26 DIAGNOSIS — D45 Polycythemia vera: Secondary | ICD-10-CM

## 2013-11-26 DIAGNOSIS — M79609 Pain in unspecified limb: Secondary | ICD-10-CM

## 2013-11-26 DIAGNOSIS — D751 Secondary polycythemia: Secondary | ICD-10-CM

## 2013-11-26 DIAGNOSIS — M79604 Pain in right leg: Secondary | ICD-10-CM

## 2013-11-26 LAB — CBC WITH DIFFERENTIAL (CANCER CENTER ONLY)
BASO#: 0.3 10*3/uL — ABNORMAL HIGH (ref 0.0–0.2)
BASO%: 3.1 % — ABNORMAL HIGH (ref 0.0–2.0)
EOS%: 4.7 % (ref 0.0–7.0)
Eosinophils Absolute: 0.4 10*3/uL (ref 0.0–0.5)
HCT: 46.3 % (ref 38.7–49.9)
HGB: 14.6 g/dL (ref 13.0–17.1)
LYMPH#: 1.4 10*3/uL (ref 0.9–3.3)
LYMPH%: 15.2 % (ref 14.0–48.0)
MCH: 23.7 pg — ABNORMAL LOW (ref 28.0–33.4)
MCHC: 31.5 g/dL — ABNORMAL LOW (ref 32.0–35.9)
MCV: 75 fL — ABNORMAL LOW (ref 82–98)
MONO#: 0.9 10*3/uL (ref 0.1–0.9)
MONO%: 9.4 % (ref 0.0–13.0)
NEUT#: 6.4 10*3/uL (ref 1.5–6.5)
NEUT%: 67.6 % (ref 40.0–80.0)
Platelets: 424 10*3/uL — ABNORMAL HIGH (ref 145–400)
RBC: 6.15 10*6/uL — ABNORMAL HIGH (ref 4.20–5.70)
RDW: 16.3 % — ABNORMAL HIGH (ref 11.1–15.7)
WBC: 9.4 10*3/uL (ref 4.0–10.0)

## 2013-11-26 LAB — TECHNOLOGIST REVIEW CHCC SATELLITE

## 2013-11-26 LAB — COMPREHENSIVE METABOLIC PANEL
ALT: 57 U/L — ABNORMAL HIGH (ref 0–53)
AST: 37 U/L (ref 0–37)
Albumin: 4.5 g/dL (ref 3.5–5.2)
Alkaline Phosphatase: 56 U/L (ref 39–117)
BUN: 15 mg/dL (ref 6–23)
CO2: 28 mEq/L (ref 19–32)
Calcium: 8.9 mg/dL (ref 8.4–10.5)
Chloride: 102 mEq/L (ref 96–112)
Creatinine, Ser: 0.97 mg/dL (ref 0.50–1.35)
Glucose, Bld: 70 mg/dL (ref 70–99)
Potassium: 4.4 mEq/L (ref 3.5–5.3)
Sodium: 138 mEq/L (ref 135–145)
Total Bilirubin: 0.8 mg/dL (ref 0.2–1.2)
Total Protein: 6.5 g/dL (ref 6.0–8.3)

## 2013-11-26 LAB — CHCC SATELLITE - SMEAR

## 2013-11-26 LAB — LACTATE DEHYDROGENASE: LDH: 225 U/L (ref 94–250)

## 2013-11-26 NOTE — Telephone Encounter (Signed)
Left voicemail informing pt we suggest he gets the ultrasound done tomorrow before he goes out of town instead of on Monday.

## 2013-11-26 NOTE — Progress Notes (Signed)
[name] [name] presents today for phlebotomy per MD orders. Phlebotomy procedure started at 1430 and ended at 1445 . 500 mls removed. Patient observed for 30 minutes after procedure without any incident. Patient tolerated procedure well. IV needle removed intact.

## 2013-11-26 NOTE — Patient Instructions (Signed)

## 2013-11-26 NOTE — Progress Notes (Signed)
Hematology and Oncology Follow Up Visit  [name] [name] [id number] 03/08/1956 58 y.o. 11/26/2013   Principle Diagnosis:  Polycythemia vera- JAK2 (+)  Current Therapy:    Phlebotomy to maintain hematocrit below 45%  Hydrea 500 mg by mouth daily alternating with 1000 mg by mouth daily  Aspirin 81 mg by mouth daily     Interim History:  [name] [name] is back for followup. He is doing okay. He feels all right. He is complaining of some pain in his legs. He has some point tenderness in his lower legs and also in his thighs. I think we will have to check to make sure he does not have any thromboembolism. He is at risk for this.  He's done well with the Hydrea. He's had no problems with skin ulcerations. He's had no nausea vomiting.  We have been phlebotomizing him every 4-6 weeks. His hematocrit is slowly coming down.  He is still working. There is still a lot of stress at work.  He is iron deficient.  The chalazion on the right I. she is beginning better.  Medications: Current outpatient prescriptions:acetaminophen (TYLENOL) 500 MG tablet, Take 1,000 mg by mouth every 6 (six) hours as needed. For pain , Disp: , Rfl: ;  aspirin 81 MG tablet, Take 81 mg by mouth daily.  , Disp: , Rfl: ;  clidinium-chlordiazePOXIDE (LIBRAX) 5-2.5 MG per capsule, Take 1 capsule by mouth as needed., Disp: 60 capsule, Rfl: 1 diphenhydramine-acetaminophen (TYLENOL PM) 25-500 MG TABS, Take 1 tablet by mouth at bedtime as needed., Disp: , Rfl: ;  diphenoxylate-atropine (LOMOTIL) 2.5-0.025 MG per tablet, Take 1 tablet by mouth as needed for diarrhea or loose stools., Disp: , Rfl: ;  hydroxyurea (HYDREA) 500 MG capsule, TAKE 1 CAPSULES BY MOUTH EVERY DAY, Disp: , Rfl: ;  hyoscyamine (LEVBID) 0.375 MG 12 hr tablet, Take 0.375 mg by mouth as needed., Disp: , Rfl:  meloxicam (MOBIC) 15 MG tablet, Take 15 mg by mouth as needed., Disp: , Rfl: ;  propranolol ER (INDERAL LA) 80 MG 24 hr capsule, TAKE 1 CAPSULE (80 MG TOTAL) BY  MOUTH DAILY., Disp: 30 capsule, Rfl: 0;  psyllium (METAMUCIL) 58.6 % powder, Take 1 packet by mouth daily., Disp: , Rfl: ;  zolpidem (AMBIEN CR) 12.5 MG CR tablet, Take by mouth as needed. For sleep pt usually takes 1/2 tablet, Disp: , Rfl:  [DISCONTINUED] propranolol (INNOPRAN XL) 80 MG 24 hr capsule, Take 1 capsule (80 mg total) by mouth at bedtime., Disp: 30 capsule, Rfl: 0  Allergies: No Known Allergies  Past Medical History, Surgical history, Social history, and Family History were reviewed and updated.  Review of Systems: As above  Physical Exam:  height is 5\' 10"  (1.778 m) and weight is 205 lb (92.987 kg). His oral temperature is 98.3 F (36.8 C). His blood pressure is 132/86 and his pulse is 57. His respiration is 16.   Well-developed and well-nourished white male. Head and neck exam is no ocular or oral lesions. He has no palpable cervical or supraclavicular lymph nodes. Lungs are clear bilaterally. Cardiac exam regular in rhythm with no murmurs rubs or bruits. Abdomen is soft. Has good bowel sounds. There is no fluid wave. There is no palpable abdominal mass. There is no palpable liver or spleen tip. Back exam shows no tenderness over the spine ribs or hips. Extremities shows no clubbing cyanosis or edema. No obvious venous cords noted in the legs. No erythema is noted about his joints. Neurological exam  is nonfocal.  Lab Results  Component Value Date   WBC 9.4 11/26/2013   HGB 14.6 11/26/2013   HCT 46.3 11/26/2013   MCV 75* 11/26/2013   PLT 424* 11/26/2013     Chemistry      Component Value Date/Time   NA 139 10/15/2013 1406   NA 134* 06/04/2013 1440   K 3.7 10/15/2013 1406   K 4.1 06/04/2013 1440   CL 102 10/15/2013 1406   CL 101 06/04/2013 1440   CO2 29 10/15/2013 1406   CO2 26 06/04/2013 1440   BUN 12 10/15/2013 1406   BUN 11 06/04/2013 1440   CREATININE 1.1 10/15/2013 1406   CREATININE 0.79 06/04/2013 1440      Component Value Date/Time   CALCIUM 8.9 10/15/2013 1406   CALCIUM  9.3 06/04/2013 1440   ALKPHOS 48 10/15/2013 1406   ALKPHOS 55 06/04/2013 1440   AST 35 10/15/2013 1406   AST 41* 06/04/2013 1440   ALT 36 10/15/2013 1406   ALT 69* 06/04/2013 1440   BILITOT 1.10 10/15/2013 1406   BILITOT 1.0 06/04/2013 1440         Impression and Plan: [name] [name] is 58 her old gentleman with polycythemia vera.. He's doing okay. His blood count is better. This I am happy about.  We will go ahead and phlebotomize him.  We will also get Dopplers of his legs just to make sure that there is no thromboembolic disease.  If he does have thrombo-embolic diseases leg, we'll have to consider anticoagulation.  We will go ahead and get him back in another 6 weeks. Hopefully, we can get him back after the Labor Day holiday.  [name], MD 7/23/20154:48 PM

## 2013-11-27 ENCOUNTER — Telehealth: Payer: Self-pay | Admitting: *Deleted

## 2013-11-27 LAB — FERRITIN CHCC: Ferritin: 9 ng/ml — ABNORMAL LOW (ref 22–316)

## 2013-11-27 NOTE — Telephone Encounter (Signed)
Called pt back. Pt called last night and spoke with [facility] stating that he can schedule his Ultrasound appt for this Saturday.  Left voicemail informing pt that he can have an appt with imaging at 11:30 on Saturday. Gave pt imaging's phone number in case he needed to re-schedule.

## 2013-11-28 ENCOUNTER — Ambulatory Visit (HOSPITAL_BASED_OUTPATIENT_CLINIC_OR_DEPARTMENT_OTHER)
Admission: RE | Admit: 2013-11-28 | Discharge: 2013-11-28 | Disposition: A | Discharge status: Home / Self Care | Payer: Managed Care, Other (non HMO) | Source: Ambulatory Visit | Attending: Hematology & Oncology | Admitting: Hematology & Oncology

## 2013-11-28 DIAGNOSIS — M79609 Pain in unspecified limb: Secondary | ICD-10-CM | POA: Insufficient documentation

## 2013-11-28 DIAGNOSIS — M7989 Other specified soft tissue disorders: Secondary | ICD-10-CM | POA: Insufficient documentation

## 2013-11-30 ENCOUNTER — Telehealth: Payer: Self-pay | Admitting: *Deleted

## 2013-11-30 ENCOUNTER — Other Ambulatory Visit (HOSPITAL_BASED_OUTPATIENT_CLINIC_OR_DEPARTMENT_OTHER): Payer: Managed Care, Other (non HMO)

## 2013-11-30 ENCOUNTER — Ambulatory Visit (HOSPITAL_BASED_OUTPATIENT_CLINIC_OR_DEPARTMENT_OTHER): Payer: Managed Care, Other (non HMO)

## 2013-11-30 NOTE — Telephone Encounter (Signed)
Per Dr Marin Olp, informed pt that the doppler was negative for DVT's.

## 2013-12-24 ENCOUNTER — Other Ambulatory Visit: Payer: Self-pay

## 2013-12-24 MED ORDER — PROPRANOLOL HCL ER 80 MG PO CP24: ORAL_CAPSULE | ORAL | Status: DC

## 2013-12-28 ENCOUNTER — Encounter: Payer: Self-pay | Admitting: Internal Medicine

## 2014-01-07 ENCOUNTER — Other Ambulatory Visit: Payer: Managed Care, Other (non HMO) | Admitting: Lab

## 2014-01-07 ENCOUNTER — Ambulatory Visit: Payer: Managed Care, Other (non HMO) | Admitting: Hematology & Oncology

## 2014-01-08 ENCOUNTER — Telehealth: Payer: Self-pay | Admitting: Hematology & Oncology

## 2014-01-08 NOTE — Telephone Encounter (Signed)
Pt moved 9-8 to 9-25

## 2014-01-12 ENCOUNTER — Other Ambulatory Visit: Payer: Managed Care, Other (non HMO) | Admitting: Lab

## 2014-01-12 ENCOUNTER — Ambulatory Visit: Payer: Managed Care, Other (non HMO) | Admitting: Hematology & Oncology

## 2014-01-29 ENCOUNTER — Other Ambulatory Visit (HOSPITAL_BASED_OUTPATIENT_CLINIC_OR_DEPARTMENT_OTHER): Payer: Managed Care, Other (non HMO) | Admitting: Lab

## 2014-01-29 ENCOUNTER — Telehealth: Payer: Self-pay | Admitting: Hematology & Oncology

## 2014-01-29 ENCOUNTER — Ambulatory Visit: Payer: Managed Care, Other (non HMO)

## 2014-01-29 ENCOUNTER — Encounter: Payer: Self-pay | Admitting: Hematology & Oncology

## 2014-01-29 ENCOUNTER — Ambulatory Visit (HOSPITAL_BASED_OUTPATIENT_CLINIC_OR_DEPARTMENT_OTHER): Payer: Managed Care, Other (non HMO) | Admitting: Hematology & Oncology

## 2014-01-29 VITALS — BP 131/70 | HR 64 | Temp 98.8°F | Resp 18 | Ht 70.0 in | Wt 204.0 lb

## 2014-01-29 DIAGNOSIS — D751 Secondary polycythemia: Secondary | ICD-10-CM

## 2014-01-29 DIAGNOSIS — D45 Polycythemia vera: Secondary | ICD-10-CM

## 2014-01-29 LAB — CMP (CANCER CENTER ONLY)
ALT(SGPT): 45 U/L (ref 10–47)
AST: 40 U/L — ABNORMAL HIGH (ref 11–38)
Albumin: 3.9 g/dL (ref 3.3–5.5)
Alkaline Phosphatase: 51 U/L (ref 26–84)
BUN, Bld: 11 mg/dL (ref 7–22)
CO2: 24 mEq/L (ref 18–33)
Calcium: 8.6 mg/dL (ref 8.0–10.3)
Chloride: 104 mEq/L (ref 98–108)
Creat: 0.9 mg/dl (ref 0.6–1.2)
Glucose, Bld: 108 mg/dL (ref 73–118)
Potassium: 3.6 mEq/L (ref 3.3–4.7)
Sodium: 140 mEq/L (ref 128–145)
Total Bilirubin: 1.1 mg/dl (ref 0.20–1.60)
Total Protein: 6.4 g/dL (ref 6.4–8.1)

## 2014-01-29 LAB — CBC WITH DIFFERENTIAL (CANCER CENTER ONLY)
BASO#: 0.3 10*3/uL — ABNORMAL HIGH (ref 0.0–0.2)
BASO%: 3 % — ABNORMAL HIGH (ref 0.0–2.0)
EOS%: 4.4 % (ref 0.0–7.0)
Eosinophils Absolute: 0.4 10*3/uL (ref 0.0–0.5)
HCT: 43.9 % (ref 38.7–49.9)
HGB: 13.8 g/dL (ref 13.0–17.1)
LYMPH#: 1.2 10*3/uL (ref 0.9–3.3)
LYMPH%: 13.8 % — ABNORMAL LOW (ref 14.0–48.0)
MCH: 23.1 pg — ABNORMAL LOW (ref 28.0–33.4)
MCHC: 31.4 g/dL — ABNORMAL LOW (ref 32.0–35.9)
MCV: 73 fL — ABNORMAL LOW (ref 82–98)
MONO#: 0.7 10*3/uL (ref 0.1–0.9)
MONO%: 8.2 % (ref 0.0–13.0)
NEUT#: 6.2 10*3/uL (ref 1.5–6.5)
NEUT%: 70.6 % (ref 40.0–80.0)
Platelets: 391 10*3/uL (ref 145–400)
RBC: 5.98 10*6/uL — ABNORMAL HIGH (ref 4.20–5.70)
RDW: 18 % — ABNORMAL HIGH (ref 11.1–15.7)
WBC: 8.8 10*3/uL (ref 4.0–10.0)

## 2014-01-29 LAB — FERRITIN CHCC: Ferritin: 8 ng/ml — ABNORMAL LOW (ref 22–316)

## 2014-01-29 LAB — LACTATE DEHYDROGENASE: LDH: 237 U/L (ref 94–250)

## 2014-01-29 NOTE — Telephone Encounter (Signed)
Left pt message with 11-6 appointment

## 2014-01-29 NOTE — Progress Notes (Signed)
Per MD, no phlebotomy today.

## 2014-01-29 NOTE — Progress Notes (Signed)
Hematology and Oncology Follow Up Visit  [name] [name] [id number] August 28, 1955 58 y.o. 01/29/2014   Principle Diagnosis:  Polycythemia vera- JAK2 (+)  Current Therapy:    Phlebotomy to maintain hematocrit below 45%  Hydrea 500 mg by mouth daily  Aspirin 81 mg by mouth daily     Interim History:  [name] [name] is back for followup. He's been doing quite good. We last saw him back in July.  He's had no problems with pain. He's had no problems with cough present no problems with shortness of breath. He's had no problems with bowels or bladder. He's had no leg swelling. He's had no rashes. He's had no headache.  We were making him iron deficient. We last saw him, his ferritin was 9.  Medications: Current outpatient prescriptions:acetaminophen (TYLENOL) 500 MG tablet, Take 1,000 mg by mouth every 6 (six) hours as needed. For pain , Disp: , Rfl: ;  aspirin 81 MG tablet, Take 81 mg by mouth daily.  , Disp: , Rfl: ;  BuPROPion HCl (WELLBUTRIN PO), Take by mouth daily. TAKES 1/2  TAB DAILY, Disp: , Rfl: ;  clidinium-chlordiazePOXIDE (LIBRAX) 5-2.5 MG per capsule, Take 1 capsule by mouth as needed., Disp: 60 capsule, Rfl: 1 diphenhydramine-acetaminophen (TYLENOL PM) 25-500 MG TABS, Take 1 tablet by mouth at bedtime as needed. TAKES 1/2 TAB AT BEDTIME, Disp: , Rfl: ;  diphenoxylate-atropine (LOMOTIL) 2.5-0.025 MG per tablet, Take 1 tablet by mouth as needed for diarrhea or loose stools., Disp: , Rfl: ;  hydroxyurea (HYDREA) 500 MG capsule, TAKE 1 CAPSULES BY MOUTH EVERY DAY, Disp: , Rfl:  hyoscyamine (LEVBID) 0.375 MG 12 hr tablet, Take 0.375 mg by mouth as needed., Disp: , Rfl: ;  meloxicam (MOBIC) 15 MG tablet, Take 15 mg by mouth as needed., Disp: , Rfl: ;  propranolol ER (INDERAL LA) 80 MG 24 hr capsule, TAKE 1 CAPSULE (80 MG TOTAL) BY MOUTH DAILY., Disp: 15 capsule, Rfl: 0;  psyllium (METAMUCIL) 58.6 % powder, Take 1 packet by mouth daily., Disp: , Rfl:  zolpidem (AMBIEN CR) 12.5 MG CR tablet,  Take by mouth as needed. For sleep pt usually takes 1/2 tablet, Disp: , Rfl: ;  [DISCONTINUED] propranolol (INNOPRAN XL) 80 MG 24 hr capsule, Take 1 capsule (80 mg total) by mouth at bedtime., Disp: 30 capsule, Rfl: 0  Allergies: No Known Allergies  Past Medical History, Surgical history, Social history, and Family History were reviewed and updated.  Review of Systems: As above  Physical Exam:  height is 5\' 10"  (1.778 m) and weight is 204 lb (92.534 kg). His oral temperature is 98.8 F (37.1 C). His blood pressure is 131/70 and his pulse is 64. His respiration is 18.   Well-developed and well-nourished white gentleman. Head and neck exam shows no ocular or oral lesions. He has no scleral icterus. She has no adenopathy in the neck. Lungs are clear. Cardiac exam regular rate and rhythm with no murmurs, rubs or bruits. Abdomen soft. Has good bowel sounds. There is no fluid wave. There is no palpable liver or spleen tip. Extremities shows no clubbing, cyanosis or edema. Skin exam no rashes, ecchymosis or petechia. Neurological exam shows no focal neurological deficits.  Lab Results  Component Value Date   WBC 8.8 01/29/2014   HGB 13.8 01/29/2014   HCT 43.9 01/29/2014   MCV 73* 01/29/2014   PLT 391 01/29/2014     Chemistry      Component Value Date/Time   NA 140 01/29/2014 0816  NA 138 11/26/2013 1344   K 3.6 01/29/2014 0816   K 4.4 11/26/2013 1344   CL 104 01/29/2014 0816   CL 102 11/26/2013 1344   CO2 24 01/29/2014 0816   CO2 28 11/26/2013 1344   BUN 11 01/29/2014 0816   BUN 15 11/26/2013 1344   CREATININE 0.9 01/29/2014 0816   CREATININE 0.97 11/26/2013 1344      Component Value Date/Time   CALCIUM 8.6 01/29/2014 0816   CALCIUM 8.9 11/26/2013 1344   ALKPHOS 51 01/29/2014 0816   ALKPHOS 56 11/26/2013 1344   AST 40* 01/29/2014 0816   AST 37 11/26/2013 1344   ALT 45 01/29/2014 0816   ALT 57* 11/26/2013 1344   BILITOT 1.10 01/29/2014 0816   BILITOT 0.8 11/26/2013 1344         Impression and  Plan: [name] [name] is 58 year old gentleman with polycythemia vera. He's doing very nicely. He does not a to be phlebotomized today. Again, his artificial soak this will help with decreasing the speed of his hematocrit rise.  His platelet count is actually better. His white cell count is actually better. He's on on 500 mg a day of Hydrea.  We will plan to get him back in 6 weeks. By then,, we probably will phlebotomize him so we can get him through the holidays.     [name], MD 9/25/20159:04 AM

## 2014-02-18 ENCOUNTER — Other Ambulatory Visit: Payer: Self-pay | Admitting: Nurse Practitioner

## 2014-02-18 MED ORDER — HYDROXYUREA 500 MG PO CAPS: ORAL_CAPSULE | ORAL | Status: DC

## 2014-03-03 ENCOUNTER — Other Ambulatory Visit: Payer: Self-pay | Admitting: Internal Medicine

## 2014-03-04 ENCOUNTER — Telehealth: Payer: Self-pay | Admitting: Hematology & Oncology

## 2014-03-04 NOTE — Telephone Encounter (Signed)
Pt moved 11-6 to 11-25

## 2014-03-12 ENCOUNTER — Ambulatory Visit: Payer: Managed Care, Other (non HMO) | Admitting: Hematology & Oncology

## 2014-03-12 ENCOUNTER — Other Ambulatory Visit: Payer: Managed Care, Other (non HMO) | Admitting: Lab

## 2014-03-23 ENCOUNTER — Emergency Department (HOSPITAL_BASED_OUTPATIENT_CLINIC_OR_DEPARTMENT_OTHER)
Admission: EM | Admit: 2014-03-23 | Discharge: 2014-03-23 | Disposition: A | Discharge status: Home / Self Care | Payer: Managed Care, Other (non HMO) | Attending: Emergency Medicine | Admitting: Emergency Medicine

## 2014-03-23 ENCOUNTER — Encounter (HOSPITAL_BASED_OUTPATIENT_CLINIC_OR_DEPARTMENT_OTHER): Payer: Self-pay

## 2014-03-23 DIAGNOSIS — Z79899 Other long term (current) drug therapy: Secondary | ICD-10-CM | POA: Insufficient documentation

## 2014-03-23 DIAGNOSIS — Z87448 Personal history of other diseases of urinary system: Secondary | ICD-10-CM | POA: Diagnosis not present

## 2014-03-23 DIAGNOSIS — S61002A Unspecified open wound of left thumb without damage to nail, initial encounter: Secondary | ICD-10-CM | POA: Insufficient documentation

## 2014-03-23 DIAGNOSIS — Z8639 Personal history of other endocrine, nutritional and metabolic disease: Secondary | ICD-10-CM | POA: Insufficient documentation

## 2014-03-23 DIAGNOSIS — Z8669 Personal history of other diseases of the nervous system and sense organs: Secondary | ICD-10-CM | POA: Diagnosis not present

## 2014-03-23 DIAGNOSIS — Z23 Encounter for immunization: Secondary | ICD-10-CM | POA: Diagnosis not present

## 2014-03-23 DIAGNOSIS — Y9389 Activity, other specified: Secondary | ICD-10-CM | POA: Diagnosis not present

## 2014-03-23 DIAGNOSIS — W5501XA Bitten by cat, initial encounter: Secondary | ICD-10-CM | POA: Diagnosis not present

## 2014-03-23 DIAGNOSIS — Z7982 Long term (current) use of aspirin: Secondary | ICD-10-CM | POA: Insufficient documentation

## 2014-03-23 DIAGNOSIS — K589 Irritable bowel syndrome without diarrhea: Secondary | ICD-10-CM | POA: Diagnosis not present

## 2014-03-23 DIAGNOSIS — Y998 Other external cause status: Secondary | ICD-10-CM | POA: Diagnosis not present

## 2014-03-23 DIAGNOSIS — Z8679 Personal history of other diseases of the circulatory system: Secondary | ICD-10-CM | POA: Diagnosis not present

## 2014-03-23 DIAGNOSIS — Z862 Personal history of diseases of the blood and blood-forming organs and certain disorders involving the immune mechanism: Secondary | ICD-10-CM | POA: Insufficient documentation

## 2014-03-23 DIAGNOSIS — Y9289 Other specified places as the place of occurrence of the external cause: Secondary | ICD-10-CM | POA: Insufficient documentation

## 2014-03-23 DIAGNOSIS — S61051A Open bite of right thumb without damage to nail, initial encounter: Secondary | ICD-10-CM | POA: Diagnosis present

## 2014-03-23 DIAGNOSIS — S61252A Open bite of right middle finger without damage to nail, initial encounter: Secondary | ICD-10-CM | POA: Insufficient documentation

## 2014-03-23 MED ORDER — TETANUS-DIPHTH-ACELL PERTUSSIS 5-2.5-18.5 LF-MCG/0.5 IM SUSP
0.5000 mL | Freq: Once | INTRAMUSCULAR | Status: AC
  Administered 2014-03-23: 0.5 mL via INTRAMUSCULAR
  Filled 2014-03-23: qty 0.5

## 2014-03-23 MED ORDER — AMOXICILLIN-POT CLAVULANATE 875-125 MG PO TABS: 1.0000 | ORAL_TABLET | Freq: Two times a day (BID) | ORAL | Status: DC

## 2014-03-23 NOTE — Discharge Instructions (Signed)

## 2014-03-23 NOTE — ED Notes (Addendum)
Pt was bit by own cat when he was rescuing cat from dog attacking cat-puncture wounds to both thumbs and right index finger-cat UTD-pt states he has cleaned wounds-small puncture to index finger continues to ooze blood-gauze given

## 2014-03-23 NOTE — ED Provider Notes (Signed)
CSN: [id number]     Arrival date & time 03/23/14  1551 History   First MD Initiated Contact with Patient 03/23/14 1610     Chief Complaint  Patient presents with  . Animal Bite     (Consider location/radiation/quality/duration/timing/severity/associated sxs/prior Treatment) Patient is a 58 y.o. male presenting with animal bite.  Animal Bite Contact animal:  Cat Location:  Hand Hand injury location:  R fingers and L fingers Pain details:    Quality:  Aching   Severity:  Mild   Timing:  Constant   Progression:  Unchanged Incident location:  Home Provoked: provoked   Notifications:  Animal control Animal's rabies vaccination status:  Up to date Animal in possession: yes   Tetanus status:  Out of date Relieved by:  Nothing Worsened by:  Activity Ineffective treatments:  None tried Associated symptoms: swelling   Associated symptoms: no fever and no numbness     Past Medical History  Diagnosis Date  . Other and unspecified hyperlipidemia   . Diverticulosis of colon (without mention of hemorrhage)   . Polycythemia, secondary   . Nephrolithiasis   . IBS (irritable bowel syndrome)   . Hemorrhoids   . Irregular heartbeat   . Stye     blocker duct right eye lid  . Perirectal fistula    Past Surgical History  Procedure Laterality Date  . Rotator cuff repair      rt.  . Vasectomy    . Retinal detachment surgery     Family History  Problem Relation Age of Onset  . Aortic aneurysm Father   . Heart attack Brother   . Colon cancer Neg Hx   . Esophageal cancer Neg Hx   . Pancreatic cancer Neg Hx   . Prostate cancer Neg Hx   . Rectal cancer Neg Hx   . Stomach cancer Neg Hx    History  Substance Use Topics  . Smoking status: Never Smoker   . Smokeless tobacco: Never Used     Comment: never used tobacco  . Alcohol Use: Yes    Review of Systems  Constitutional: Negative for fever.  Neurological: Negative for numbness.  All other systems reviewed and are  negative.     Allergies  Review of patient's allergies indicates no known allergies.  Home Medications   Prior to Admission medications   Medication Sig Start Date End Date Taking? Authorizing Provider  acetaminophen (TYLENOL) 500 MG tablet Take 1,000 mg by mouth every 6 (six) hours as needed. For pain     Historical Provider, MD  amoxicillin-clavulanate (AUGMENTIN) 875-125 MG per tablet Take 1 tablet by mouth every 12 (twelve) hours. 03/23/14   Debby Freiberg, MD  aspirin 81 MG tablet Take 81 mg by mouth daily.      Historical Provider, MD  BuPROPion HCl (WELLBUTRIN PO) Take by mouth daily. TAKES 1/2  TAB DAILY 01/11/14   Historical Provider, MD  clidinium-chlordiazePOXIDE (LIBRAX) 5-2.5 MG per capsule TAKE 1 CAPSULE BY MOUTH AS NEEDED 03/04/14   Irene Shipper, MD  diphenhydramine-acetaminophen (TYLENOL PM) 25-500 MG TABS Take 1 tablet by mouth at bedtime as needed. TAKES 1/2 TAB AT BEDTIME    Historical Provider, MD  diphenoxylate-atropine (LOMOTIL) 2.5-0.025 MG per tablet Take 1 tablet by mouth as needed for diarrhea or loose stools.    Historical Provider, MD  hydroxyurea (HYDREA) 500 MG capsule TAKE 1 CAPSULES BY MOUTH EVERY DAY 02/18/14   [name], MD  hyoscyamine (LEVBID) 0.375 MG 12 hr tablet  Take 0.375 mg by mouth as needed. 08/12/13   Irene Shipper, MD  meloxicam (MOBIC) 15 MG tablet Take 15 mg by mouth as needed. 03/04/13   [name], MD  propranolol ER (INDERAL LA) 80 MG 24 hr capsule TAKE 1 CAPSULE (80 MG TOTAL) BY MOUTH DAILY. 12/24/13   Lelon Perla, MD  psyllium (METAMUCIL) 58.6 % powder Take 1 packet by mouth daily.    Historical Provider, MD  zolpidem (AMBIEN CR) 12.5 MG CR tablet Take by mouth as needed. For sleep pt usually takes 1/2 tablet    Historical Provider, MD   BP 149/88 mmHg  Pulse 64  Temp(Src) 98.3 F (36.8 C) (Oral)  Resp 18  Ht 6' (1.829 m)  Wt 195 lb (88.451 kg)  BMI 26.44 kg/m2 Physical Exam  Constitutional: He is oriented to person,  place, and time. He appears well-developed and well-nourished.  HENT:  Head: Normocephalic and atraumatic.  Eyes: Conjunctivae and EOM are normal.  Neck: Normal range of motion. Neck supple.  Cardiovascular: Normal rate, regular rhythm and normal heart sounds.   Pulmonary/Chest: Effort normal and breath sounds normal. No respiratory distress.  Abdominal: He exhibits no distension. There is no tenderness. There is no rebound and no guarding.  Musculoskeletal: Normal range of motion.  Punctures to R thumb, 3rd finger, L thumb  Neurological: He is alert and oriented to person, place, and time.  Skin: Skin is warm and dry.  Vitals reviewed.   ED Course  Procedures (including critical care time) Labs Review Labs Reviewed - No data to display  Imaging Review No results found.   EKG Interpretation None      MDM   Final diagnoses:  Cat bite, initial encounter    58 y.o. male with pertinent PMH of polycythemia presents with cat bites from his own cat.  Patient was in his normal state of health and noticed that his cat was being attacked by an unknown dog. The cat was severely injured. On taking his cat to the, who is up-to-date on all vaccinations, he was bitten in the hands numerous times. He has had no systemic symptoms. He washed the wound copiously at the vet. On arrival today vitals signs and physical exam as above. Systemic symptoms. No acute infections this time. Will update tetanus and discharged with prophylactic Augmentin. Discharge home to follow-up for wound check in 3 days.    1. Cat bite, initial encounter         Debby Freiberg, MD 03/23/14 1623

## 2014-03-31 ENCOUNTER — Encounter: Payer: Self-pay | Admitting: Hematology & Oncology

## 2014-03-31 ENCOUNTER — Other Ambulatory Visit (HOSPITAL_BASED_OUTPATIENT_CLINIC_OR_DEPARTMENT_OTHER): Payer: Managed Care, Other (non HMO) | Admitting: Lab

## 2014-03-31 ENCOUNTER — Ambulatory Visit (HOSPITAL_BASED_OUTPATIENT_CLINIC_OR_DEPARTMENT_OTHER): Payer: Managed Care, Other (non HMO) | Admitting: Hematology & Oncology

## 2014-03-31 ENCOUNTER — Ambulatory Visit (HOSPITAL_BASED_OUTPATIENT_CLINIC_OR_DEPARTMENT_OTHER): Payer: Managed Care, Other (non HMO)

## 2014-03-31 VITALS — BP 130/81 | HR 60 | Temp 98.0°F | Resp 18 | Ht 71.0 in | Wt 205.0 lb

## 2014-03-31 DIAGNOSIS — D45 Polycythemia vera: Secondary | ICD-10-CM

## 2014-03-31 DIAGNOSIS — D751 Secondary polycythemia: Secondary | ICD-10-CM

## 2014-03-31 LAB — CMP (CANCER CENTER ONLY)
ALT(SGPT): 103 U/L — ABNORMAL HIGH (ref 10–47)
AST: 53 U/L — ABNORMAL HIGH (ref 11–38)
Albumin: 3.8 g/dL (ref 3.3–5.5)
Alkaline Phosphatase: 70 U/L (ref 26–84)
BUN, Bld: 9 mg/dL (ref 7–22)
CO2: 23 mEq/L (ref 18–33)
Calcium: 8.9 mg/dL (ref 8.0–10.3)
Chloride: 102 mEq/L (ref 98–108)
Creat: 0.8 mg/dl (ref 0.6–1.2)
Glucose, Bld: 116 mg/dL (ref 73–118)
Potassium: 4 mEq/L (ref 3.3–4.7)
Sodium: 139 mEq/L (ref 128–145)
Total Bilirubin: 0.8 mg/dl (ref 0.20–1.60)
Total Protein: 6.9 g/dL (ref 6.4–8.1)

## 2014-03-31 LAB — CBC WITH DIFFERENTIAL (CANCER CENTER ONLY)
BASO#: 0.3 10*3/uL — ABNORMAL HIGH (ref 0.0–0.2)
BASO%: 2.4 % — ABNORMAL HIGH (ref 0.0–2.0)
EOS%: 5.1 % (ref 0.0–7.0)
Eosinophils Absolute: 0.6 10*3/uL — ABNORMAL HIGH (ref 0.0–0.5)
HCT: 48.7 % (ref 38.7–49.9)
HGB: 15.2 g/dL (ref 13.0–17.1)
LYMPH#: 1.5 10*3/uL (ref 0.9–3.3)
LYMPH%: 11.9 % — ABNORMAL LOW (ref 14.0–48.0)
MCH: 22.9 pg — ABNORMAL LOW (ref 28.0–33.4)
MCHC: 31.2 g/dL — ABNORMAL LOW (ref 32.0–35.9)
MCV: 73 fL — ABNORMAL LOW (ref 82–98)
MONO#: 0.7 10*3/uL (ref 0.1–0.9)
MONO%: 5.7 % (ref 0.0–13.0)
NEUT#: 9.3 10*3/uL — ABNORMAL HIGH (ref 1.5–6.5)
NEUT%: 74.9 % (ref 40.0–80.0)
Platelets: 824 10*3/uL — ABNORMAL HIGH (ref 145–400)
RBC: 6.64 10*6/uL — ABNORMAL HIGH (ref 4.20–5.70)
RDW: 20.4 % — ABNORMAL HIGH (ref 11.1–15.7)
WBC: 12.4 10*3/uL — ABNORMAL HIGH (ref 4.0–10.0)

## 2014-03-31 LAB — LACTATE DEHYDROGENASE: LDH: 432 U/L — ABNORMAL HIGH (ref 94–250)

## 2014-03-31 NOTE — Progress Notes (Signed)
[name] [name] presents today for phlebotomy per MD orders. Phlebotomy procedure started at 1430 and ended at 1436 500 grams removed. Patient observed for 30 minutes after procedure without any incident. Patient tolerated procedure well. IV needle removed intact.

## 2014-03-31 NOTE — Progress Notes (Signed)
Hematology and Oncology Follow Up Visit  Eric Nees [id number] 04-15-1956 58 y.o. 03/31/2014   Principle Diagnosis:  Polycythemia vera- JAK2 (+)  Current Therapy:    Phlebotomy to maintain hematocrit below 45%  Hydrea 500 mg by mouth daily  Aspirin 81 mg by mouth daily     Interim History:  Mr.  Hornstein is back for followup. We last saw him in September. I think it has been about 3 months since he was last phlebotomized. It actually was back in July of this year.  Unfortunately, his cat got mauled by a dog and did not survive. He tried to get the cat to the doctors. In trying different to do this, the cat really bit him and he got an infection. He had antibiotics. Again, the cat did not make it. This is very upsetting to me.  His oldest daughter is moving to [place] with her husband. They are leaving this weekend area. Overall, he is been doing okay otherwise. He is still on Hydrea.  He's had no bleeding. He does state that there is some blood per to him. He may have some hemorrhoids.  He's had no abdominal pain. He's had no nausea or vomiting. He's had no cough. He's had no rashes. There's been no leg swelling.  His last ferritin back in September was 8.  Medications: Current outpatient prescriptions: acetaminophen (TYLENOL) 500 MG tablet, Take 1,000 mg by mouth every 6 (six) hours as needed. For pain , Disp: , Rfl: ;  aspirin 81 MG tablet, Take 81 mg by mouth daily.  , Disp: , Rfl: ;  BuPROPion HCl (WELLBUTRIN PO), Take by mouth daily. TAKES 1/2  TAB DAILY, Disp: , Rfl: ;  clidinium-chlordiazePOXIDE (LIBRAX) 5-2.5 MG per capsule, TAKE 1 CAPSULE BY MOUTH AS NEEDED, Disp: 60 capsule, Rfl: 1 diphenoxylate-atropine (LOMOTIL) 2.5-0.025 MG per tablet, Take 1 tablet by mouth as needed for diarrhea or loose stools., Disp: , Rfl: ;  hydroxyurea (HYDREA) 500 MG capsule, TAKE 1 CAPSULES BY MOUTH EVERY DAY, Disp: 60 capsule, Rfl: 9;  hyoscyamine (LEVBID) 0.375 MG 12 hr tablet, Take 0.375 mg  by mouth as needed., Disp: , Rfl: ;  meloxicam (MOBIC) 15 MG tablet, Take 15 mg by mouth as needed., Disp: , Rfl:  propranolol ER (INDERAL LA) 80 MG 24 hr capsule, TAKE 1 CAPSULE (80 MG TOTAL) BY MOUTH DAILY., Disp: 15 capsule, Rfl: 0;  psyllium (METAMUCIL) 58.6 % powder, Take 1 packet by mouth daily., Disp: , Rfl: ;  zolpidem (AMBIEN CR) 12.5 MG CR tablet, Take by mouth as needed. For sleep pt usually takes 1/2 tablet, Disp: , Rfl:  [DISCONTINUED] propranolol (INNOPRAN XL) 80 MG 24 hr capsule, Take 1 capsule (80 mg total) by mouth at bedtime., Disp: 30 capsule, Rfl: 0  Allergies: No Known Allergies  Past Medical History, Surgical history, Social history, and Family History were reviewed and updated.  Review of Systems: As above  Physical Exam:  height is 5\' 11"  (1.803 m) and weight is 205 lb (92.987 kg). His oral temperature is 98 F (36.7 C). His blood pressure is 130/81 and his pulse is 60. His respiration is 18.   Well-developed and well-nourished white gentleman. Head and neck exam shows no ocular or oral lesions. He has no scleral icterus. She has no adenopathy in the neck. Lungs are clear. Cardiac exam regular rate and rhythm with no murmurs, rubs or bruits. Abdomen soft. Has good bowel sounds. There is no fluid wave. There is no palpable liver  or spleen tip. Extremities shows no clubbing, cyanosis or edema. Skin exam no rashes, ecchymosis or petechia. Neurological exam shows no focal neurological deficits.  Lab Results  Component Value Date   WBC 12.4* 03/31/2014   HGB 15.2 03/31/2014   HCT 48.7 03/31/2014   MCV 73* 03/31/2014   PLT 824* 03/31/2014     Chemistry      Component Value Date/Time   NA 139 03/31/2014 1309   NA 138 11/26/2013 1344   K 4.0 03/31/2014 1309   K 4.4 11/26/2013 1344   CL 102 03/31/2014 1309   CL 102 11/26/2013 1344   CO2 23 03/31/2014 1309   CO2 28 11/26/2013 1344   BUN 9 03/31/2014 1309   BUN 15 11/26/2013 1344   CREATININE 0.8 03/31/2014 1309    CREATININE 0.97 11/26/2013 1344      Component Value Date/Time   CALCIUM 8.9 03/31/2014 1309   CALCIUM 8.9 11/26/2013 1344   ALKPHOS 70 03/31/2014 1309   ALKPHOS 56 11/26/2013 1344   AST 53* 03/31/2014 1309   AST 37 11/26/2013 1344   ALT 103* 03/31/2014 1309   ALT 57* 11/26/2013 1344   BILITOT 0.80 03/31/2014 1309   BILITOT 0.8 11/26/2013 1344         Impression and Plan: Mr. Hires is 58 year old gentleman with polycythemia vera. He's doing very nicely. His counts are certainly different. I think this is all reflective of the recent infection that he had secondary to the cat bite. I think the thrombocytosis is mostly reactive area and  I looked at his blood smear. I do not see nothing that looked suspicious on the blood smear that would suggest some type of transformation.  He will be phlebotomized today.  We will go ahead and plan to phlebotomize him. I will then get him back in 6 weeks. Hopefully, we will see that his counts have normalized.   [name], MD 11/25/20152:14 PM

## 2014-03-31 NOTE — Patient Instructions (Signed)

## 2014-04-02 LAB — IRON AND TIBC CHCC
%SAT: 6 % — ABNORMAL LOW (ref 20–55)
Iron: 29 ug/dL — ABNORMAL LOW (ref 42–163)
TIBC: 466 ug/dL — ABNORMAL HIGH (ref 202–409)
UIBC: 437 ug/dL — ABNORMAL HIGH (ref 117–376)

## 2014-04-02 LAB — FERRITIN CHCC: Ferritin: 9 ng/ml — ABNORMAL LOW (ref 22–316)

## 2014-05-12 ENCOUNTER — Encounter: Payer: Self-pay | Admitting: Hematology & Oncology

## 2014-05-12 ENCOUNTER — Other Ambulatory Visit (HOSPITAL_BASED_OUTPATIENT_CLINIC_OR_DEPARTMENT_OTHER): Payer: Managed Care, Other (non HMO) | Admitting: Lab

## 2014-05-12 ENCOUNTER — Ambulatory Visit: Payer: Managed Care, Other (non HMO)

## 2014-05-12 ENCOUNTER — Ambulatory Visit (HOSPITAL_BASED_OUTPATIENT_CLINIC_OR_DEPARTMENT_OTHER): Payer: Managed Care, Other (non HMO) | Admitting: Hematology & Oncology

## 2014-05-12 VITALS — BP 144/80 | HR 90 | Temp 98.2°F | Resp 18 | Ht 70.0 in | Wt 209.0 lb

## 2014-05-12 DIAGNOSIS — D45 Polycythemia vera: Secondary | ICD-10-CM

## 2014-05-12 DIAGNOSIS — R05 Cough: Secondary | ICD-10-CM

## 2014-05-12 DIAGNOSIS — R002 Palpitations: Secondary | ICD-10-CM

## 2014-05-12 DIAGNOSIS — D751 Secondary polycythemia: Secondary | ICD-10-CM

## 2014-05-12 DIAGNOSIS — R059 Cough, unspecified: Secondary | ICD-10-CM

## 2014-05-12 LAB — CMP (CANCER CENTER ONLY)
ALT(SGPT): 53 U/L — ABNORMAL HIGH (ref 10–47)
AST: 42 U/L — ABNORMAL HIGH (ref 11–38)
Albumin: 4.3 g/dL (ref 3.3–5.5)
Alkaline Phosphatase: 58 U/L (ref 26–84)
BUN, Bld: 10 mg/dL (ref 7–22)
CO2: 29 mEq/L (ref 18–33)
Calcium: 9.7 mg/dL (ref 8.0–10.3)
Chloride: 105 mEq/L (ref 98–108)
Creat: 0.8 mg/dl (ref 0.6–1.2)
Glucose, Bld: 107 mg/dL (ref 73–118)
Potassium: 3.7 mEq/L (ref 3.3–4.7)
Sodium: 143 mEq/L (ref 128–145)
Total Bilirubin: 1 mg/dl (ref 0.20–1.60)
Total Protein: 7.3 g/dL (ref 6.4–8.1)

## 2014-05-12 LAB — CBC WITH DIFFERENTIAL (CANCER CENTER ONLY)
BASO#: 0.3 10*3/uL — ABNORMAL HIGH (ref 0.0–0.2)
BASO%: 2.9 % — ABNORMAL HIGH (ref 0.0–2.0)
EOS%: 4.1 % (ref 0.0–7.0)
Eosinophils Absolute: 0.5 10*3/uL (ref 0.0–0.5)
HCT: 47 % (ref 38.7–49.9)
HGB: 15 g/dL (ref 13.0–17.1)
LYMPH#: 1.5 10*3/uL (ref 0.9–3.3)
LYMPH%: 13.4 % — ABNORMAL LOW (ref 14.0–48.0)
MCH: 23.2 pg — ABNORMAL LOW (ref 28.0–33.4)
MCHC: 31.9 g/dL — ABNORMAL LOW (ref 32.0–35.9)
MCV: 73 fL — ABNORMAL LOW (ref 82–98)
MONO#: 0.6 10*3/uL (ref 0.1–0.9)
MONO%: 5.5 % (ref 0.0–13.0)
NEUT#: 8.3 10*3/uL — ABNORMAL HIGH (ref 1.5–6.5)
NEUT%: 74.1 % (ref 40.0–80.0)
Platelets: 520 10*3/uL — ABNORMAL HIGH (ref 145–400)
RBC: 6.47 10*6/uL — ABNORMAL HIGH (ref 4.20–5.70)
RDW: 18.9 % — ABNORMAL HIGH (ref 11.1–15.7)
WBC: 11.2 10*3/uL — ABNORMAL HIGH (ref 4.0–10.0)

## 2014-05-12 LAB — LACTATE DEHYDROGENASE: LDH: 360 U/L — ABNORMAL HIGH (ref 94–250)

## 2014-05-12 LAB — CHCC SATELLITE - SMEAR

## 2014-05-12 MED ORDER — PROPRANOLOL HCL ER 120 MG PO CP24: ORAL_CAPSULE | ORAL | Status: DC

## 2014-05-12 NOTE — Patient Instructions (Signed)

## 2014-05-12 NOTE — Progress Notes (Signed)
  Abbe Amsterdam presents today for phlebotomy per MD orders. Phlebotomy procedure started at 1545 and ended at 1555. 131mL removed. Patient tolerated procedure well, but needle clotted off. IV needle removed intact.  Nursing staff tried to perform phlebotomy and were unsuccessful after three sticks.  First stick, pt was able to have a little bit of blood phlebotomized (approx. 118mL) and then it clotted off.  The second and third stick, pt's needle had flashback, but then no blood would flow.  Nursing staff were unable to use phlebotomy set because none were at the facility.  Pt stated that he would return to the facility to have his phlebotomy performed once there were phlebotomy sets again. Pt and scheduler aware to set up an appt.

## 2014-05-12 NOTE — Progress Notes (Signed)
Hematology and Oncology Follow Up Visit  [name] [name] [id number] 1955/05/12 59 y.o. 05/12/2014   Principle Diagnosis:  Polycythemia vera- JAK2 (+)  Current Therapy:    Phlebotomy to maintain hematocrit below 45%  Hydrea 500 mg by mouth daily  Aspirin 81 mg by mouth daily     Interim History:  [name] is back for followup. He's not doing as well. He's been complaining of some shortness of breath. This is been going on for a couple months. He's had a little bit of a dry cough. He's had some chronic leg pain. I think we've done Dopplers on his legs before.  He's had no fever. He's had some headache. Again, this is been going on for several weeks area did nothing triggers the headache.  He also feels a few palpitations. He does see a cardiologist but has not seen him in a while.  There's been no rashes. He's had overall, pretty decent Christmas and Thanksgiving.  There has not been any change in bowel or bladder habits.  He's had no change in medications.   His last ferritin back in November was 8.  Medications: Current outpatient prescriptions: acetaminophen (TYLENOL) 500 MG tablet, Take 1,000 mg by mouth every 6 (six) hours as needed. For pain , Disp: , Rfl: ;  aspirin 81 MG tablet, Take 81 mg by mouth daily.  , Disp: , Rfl: ;  BuPROPion HCl (WELLBUTRIN PO), Take by mouth daily. TAKES 1/2  TAB DAILY, Disp: , Rfl: ;  clidinium-chlordiazePOXIDE (LIBRAX) 5-2.5 MG per capsule, TAKE 1 CAPSULE BY MOUTH AS NEEDED, Disp: 60 capsule, Rfl: 1 diphenoxylate-atropine (LOMOTIL) 2.5-0.025 MG per tablet, Take 1 tablet by mouth as needed for diarrhea or loose stools., Disp: , Rfl: ;  doxycycline (VIBRA-TABS) 100 MG tablet, Take 100 mg by mouth every 12 (twelve) hours. Start 12-22-- take for next 1 to 2 months, Disp: , Rfl: 1;  hydroxyurea (HYDREA) 500 MG capsule, TAKE 1 CAPSULES BY MOUTH EVERY DAY, Disp: 60 capsule, Rfl: 9 hyoscyamine (LEVBID) 0.375 MG 12 hr tablet, Take 0.375 mg by mouth as  needed., Disp: , Rfl: ;  meloxicam (MOBIC) 15 MG tablet, Take 15 mg by mouth as needed., Disp: , Rfl: ;  propranolol ER (INDERAL LA) 120 MG 24 hr capsule, TAKE 1 CAPSULE (80 MG TOTAL) BY MOUTH DAILY., Disp: 30 capsule, Rfl: 5;  psyllium (METAMUCIL) 58.6 % powder, Take 1 packet by mouth daily., Disp: , Rfl:  zolpidem (AMBIEN CR) 12.5 MG CR tablet, Take by mouth as needed. For sleep pt usually takes 1/2 tablet, Disp: , Rfl: ;  [DISCONTINUED] propranolol (INNOPRAN XL) 80 MG 24 hr capsule, Take 1 capsule (80 mg total) by mouth at bedtime., Disp: 30 capsule, Rfl: 0  Allergies: No Known Allergies  Past Medical History, Surgical history, Social history, and Family History were reviewed and updated.  Review of Systems: As above  Physical Exam:  height is 5\' 10"  (1.778 m) and weight is 209 lb (94.802 kg). His oral temperature is 98.2 F (36.8 C). His blood pressure is 144/80 and his pulse is 90. His respiration is 18.   Well-developed and well-nourished white gentleman. Head and neck exam shows no ocular or oral lesions. He has no scleral icterus. He has no adenopathy in the neck. Lungs are clear. Cardiac exam regular rate and rhythm with no murmurs, rubs or bruits. Abdomen is soft. He has good bowel sounds. There is no fluid wave. There is no palpable liver or spleen tip. Extremities  shows no clubbing, cyanosis or edema. No palpable venous cord is noted. Skin exam no rashes, ecchymosis or petechia. Neurological exam shows no focal neurological deficits.  Lab Results  Component Value Date   WBC 11.2* 05/12/2014   HGB 15.0 05/12/2014   HCT 47.0 05/12/2014   MCV 73* 05/12/2014   PLT 520 Large platelets present* 05/12/2014     Chemistry      Component Value Date/Time   NA 143 05/12/2014 1412   NA 138 11/26/2013 1344   K 3.7 05/12/2014 1412   K 4.4 11/26/2013 1344   CL 105 05/12/2014 1412   CL 102 11/26/2013 1344   CO2 29 05/12/2014 1412   CO2 28 11/26/2013 1344   BUN 10 05/12/2014 1412   BUN  15 11/26/2013 1344   CREATININE 0.8 05/12/2014 1412   CREATININE 0.97 11/26/2013 1344      Component Value Date/Time   CALCIUM 9.7 05/12/2014 1412   CALCIUM 8.9 11/26/2013 1344   ALKPHOS 58 05/12/2014 1412   ALKPHOS 56 11/26/2013 1344   AST 42* 05/12/2014 1412   AST 37 11/26/2013 1344   ALT 53* 05/12/2014 1412   ALT 57* 11/26/2013 1344   BILITOT 1.00 05/12/2014 1412   BILITOT 0.8 11/26/2013 1344         Impression and Plan: [name] [name] is 59 year old gentleman with polycythemia vera.  I'm not sure what to make of his complaints. We will get a chest x-ray on him.  His possible of the headache might be from the increased blood count. He is on baby aspirin.  We will increase his Hydrea to 500 mg twice a day. His plantar callus on the high side. It's come down since we last saw him. However, I still think the platelets are too high.  I also think his Inderal LA these to be increased. I will put him on 120 mg a day. His heart rate is quite high for the current dose that he is on. His blood is a little on the high side also. He will be phlebotomized today.  We will go ahead and plan to phlebotomize him. I will then get him back in 3 weeks. I think we have to closely monitor him right now.  I spent about 30 minutes with him. [name], MD 1/6/20165:34 PM

## 2014-05-14 ENCOUNTER — Ambulatory Visit (HOSPITAL_BASED_OUTPATIENT_CLINIC_OR_DEPARTMENT_OTHER)
Admission: RE | Admit: 2014-05-14 | Discharge: 2014-05-14 | Disposition: A | Discharge status: Home / Self Care | Payer: Managed Care, Other (non HMO) | Source: Ambulatory Visit | Attending: Hematology & Oncology | Admitting: Hematology & Oncology

## 2014-05-14 ENCOUNTER — Ambulatory Visit (HOSPITAL_BASED_OUTPATIENT_CLINIC_OR_DEPARTMENT_OTHER): Payer: Managed Care, Other (non HMO)

## 2014-05-14 DIAGNOSIS — R05 Cough: Secondary | ICD-10-CM

## 2014-05-14 DIAGNOSIS — D45 Polycythemia vera: Secondary | ICD-10-CM

## 2014-05-14 DIAGNOSIS — R0602 Shortness of breath: Secondary | ICD-10-CM | POA: Insufficient documentation

## 2014-05-14 DIAGNOSIS — R002 Palpitations: Secondary | ICD-10-CM

## 2014-05-14 DIAGNOSIS — R059 Cough, unspecified: Secondary | ICD-10-CM

## 2014-05-14 DIAGNOSIS — D751 Secondary polycythemia: Secondary | ICD-10-CM

## 2014-05-14 NOTE — Progress Notes (Signed)
[name] [name] presents today for phlebotomy per MD orders. Phlebotomy procedure started at [facility] and ended at 0900. 579mL removed. Patient observed for 30 minutes after procedure without any incident. Patient tolerated procedure well. IV needle removed intact.

## 2014-05-14 NOTE — Patient Instructions (Signed)

## 2014-05-17 ENCOUNTER — Telehealth: Payer: Self-pay | Admitting: *Deleted

## 2014-05-17 NOTE — Telephone Encounter (Addendum)
Patient aware  ----- Message from [name], MD sent at 05/14/2014  4:31 PM EST ----- Call and the him know that the chest x-ray is normal.

## 2014-06-02 ENCOUNTER — Ambulatory Visit (HOSPITAL_BASED_OUTPATIENT_CLINIC_OR_DEPARTMENT_OTHER): Payer: Managed Care, Other (non HMO) | Admitting: Family

## 2014-06-02 ENCOUNTER — Other Ambulatory Visit (HOSPITAL_BASED_OUTPATIENT_CLINIC_OR_DEPARTMENT_OTHER): Payer: Managed Care, Other (non HMO) | Admitting: Lab

## 2014-06-02 ENCOUNTER — Encounter: Payer: Self-pay | Admitting: Family

## 2014-06-02 DIAGNOSIS — D751 Secondary polycythemia: Secondary | ICD-10-CM

## 2014-06-02 LAB — CBC WITH DIFFERENTIAL (CANCER CENTER ONLY)
BASO#: 0.2 10*3/uL (ref 0.0–0.2)
BASO%: 3.8 % — ABNORMAL HIGH (ref 0.0–2.0)
EOS%: 4.8 % (ref 0.0–7.0)
Eosinophils Absolute: 0.3 10*3/uL (ref 0.0–0.5)
HCT: 43.7 % (ref 38.7–49.9)
HGB: 13.8 g/dL (ref 13.0–17.1)
LYMPH#: 1.4 10*3/uL (ref 0.9–3.3)
LYMPH%: 26 % (ref 14.0–48.0)
MCH: 23.7 pg — ABNORMAL LOW (ref 28.0–33.4)
MCHC: 31.6 g/dL — ABNORMAL LOW (ref 32.0–35.9)
MCV: 75 fL — ABNORMAL LOW (ref 82–98)
MONO#: 0.4 10*3/uL (ref 0.1–0.9)
MONO%: 6.9 % (ref 0.0–13.0)
NEUT#: 3.2 10*3/uL (ref 1.5–6.5)
NEUT%: 58.5 % (ref 40.0–80.0)
Platelets: 164 10*3/uL (ref 145–400)
RBC: 5.83 10*6/uL — ABNORMAL HIGH (ref 4.20–5.70)
RDW: 20 % — ABNORMAL HIGH (ref 11.1–15.7)
WBC: 5.5 10*3/uL (ref 4.0–10.0)

## 2014-06-02 NOTE — Progress Notes (Signed)
[facility]  [phone] [phone]  ID: [name] [name] OB: 03/16/1956 MR#: [id number] SNK#:[id number] Patient Care Team: Leanna Battles, MD as PCP - General (Internal Medicine)  DIAGNOSIS: Polycythemia vera- JAK2 (+)  INTERVAL HISTORY: [name] [name] is back for a follow-up. He doing much better.  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  His headaches are improved. He denies fever, chills, n/v, cough, rash, dizziness, SOB, chest pain, palpitations, abdominal pain, constipation, diarrhea, blood in urine or stool.  No swelling, tenderness or numbness in his extremities. He has mild tingling in his feet at times.  His appetite is good and he is drinking plenty of fluids. His weight is stable at 207 lbs.  His platelet count is improved at 164. He is doing well on the increased dose of Hydrea.   CURRENT TREATMENT: Phlebotomy to maintain hematocrit below 45% Hydrea 1000 mg by mouth daily Aspirin 81 mg by mouth daily  REVIEW OF SYSTEMS: All other 10 point review of systems is negative.   PAST MEDICAL HISTORY: Past Medical History  Diagnosis Date  . Other and unspecified hyperlipidemia   . Diverticulosis of colon (without mention of hemorrhage)   . Polycythemia, secondary   . Nephrolithiasis   . IBS (irritable bowel syndrome)   . Hemorrhoids   . Irregular heartbeat   . Stye     blocker duct right eye lid  . Perirectal fistula     PAST SURGICAL HISTORY: Past Surgical History  Procedure Laterality Date  . Rotator cuff repair      rt.  . Vasectomy    . Retinal detachment surgery      FAMILY HISTORY Family History  Problem Relation Age of Onset  . Aortic aneurysm Father   . Heart attack Brother   . Colon cancer Neg Hx   . Esophageal cancer Neg Hx   . Pancreatic cancer Neg Hx   . Prostate cancer Neg Hx   . Rectal cancer Neg Hx   . Stomach cancer Neg Hx     GYNECOLOGIC HISTORY:  No LMP for male patient.   SOCIAL  HISTORY: History   Social History  . Marital Status: Married    Spouse Name: N/A    Number of Children: 2  . Years of Education: N/A   Occupational History  . [place] History Main Topics  . Smoking status: Never Smoker   . Smokeless tobacco: Never Used     Comment: never used tobacco  . Alcohol Use: 0.0 oz/week    0 Not specified per week  . Drug Use: No  . Sexual Activity: Not on file   Other Topics Concern  . Not on file   Social History Narrative    ADVANCED DIRECTIVES:  <no information>  HEALTH MAINTENANCE: History  Substance Use Topics  . Smoking status: Never Smoker   . Smokeless tobacco: Never Used     Comment: never used tobacco  . Alcohol Use: 0.0 oz/week    0 Not specified per week   Colonoscopy: PAP: Bone density: Lipid panel:  No Known Allergies  Current Outpatient Prescriptions  Medication Sig Dispense Refill  . acetaminophen (TYLENOL) 500 MG tablet Take 1,000 mg by mouth every 6 (six) hours as needed. For pain     . aspirin 81 MG tablet Take 81 mg by mouth daily.      . BuPROPion HCl (WELLBUTRIN PO) Take by mouth daily. TAKES 1/2  TAB DAILY    .  clidinium-chlordiazePOXIDE (LIBRAX) 5-2.5 MG per capsule TAKE 1 CAPSULE BY MOUTH AS NEEDED 60 capsule 1  . diphenoxylate-atropine (LOMOTIL) 2.5-0.025 MG per tablet Take 1 tablet by mouth as needed for diarrhea or loose stools.    Marland Kitchen doxycycline (VIBRA-TABS) 100 MG tablet Take 100 mg by mouth every 12 (twelve) hours. Start 12-22-- take for next 1 to 2 months  1  . hydroxyurea (HYDREA) 500 MG capsule TAKE 1 CAPSULES BY MOUTH EVERY DAY (Patient taking differently: Take by mouth 2 (two) times daily. TAKE 2 CAPSULES BY MOUTH EVERY DAY) 60 capsule 9  . hyoscyamine (LEVBID) 0.375 MG 12 hr tablet Take 0.375 mg by mouth as needed.    . meloxicam (MOBIC) 15 MG tablet Take 15 mg by mouth as needed.    . propranolol ER (INDERAL LA) 120 MG 24 hr capsule TAKE 1 CAPSULE (80 MG TOTAL) BY MOUTH  DAILY. (Patient taking differently: Take 120 mg by mouth daily. TAKE 1 CAPSULE (120 MG TOTAL) BY MOUTH DAILY.) 30 capsule 5  . psyllium (METAMUCIL) 58.6 % powder Take 1 packet by mouth daily.    Marland Kitchen zolpidem (AMBIEN CR) 12.5 MG CR tablet Take by mouth as needed. For sleep pt usually takes 1/2 tablet    . [DISCONTINUED] propranolol (INNOPRAN XL) 80 MG 24 hr capsule Take 1 capsule (80 mg total) by mouth at bedtime. 30 capsule 0   No current facility-administered medications for this visit.    OBJECTIVE: Filed Vitals:   06/02/14 1454  BP: 122/79  Pulse: 68  Temp: 97.9 F (36.6 C)  Resp: 18    Filed Weights   06/02/14 1454  Weight: 207 lb (93.895 kg)   ECOG FS:0 - Asymptomatic Ocular: Sclerae unicteric, pupils equal, round and reactive to light Ear-nose-throat: Oropharynx clear, dentition fair Lymphatic: No cervical or supraclavicular adenopathy Lungs no rales or rhonchi, good excursion bilaterally Heart regular rate and rhythm, no murmur appreciated Abd soft, nontender, positive bowel sounds MSK no focal spinal tenderness, no joint edema Neuro: non-focal, well-oriented, appropriate affect  LAB RESULTS: CMP     Component Value Date/Time   NA 143 05/12/2014 1412   NA 138 11/26/2013 1344   K 3.7 05/12/2014 1412   K 4.4 11/26/2013 1344   CL 105 05/12/2014 1412   CL 102 11/26/2013 1344   CO2 29 05/12/2014 1412   CO2 28 11/26/2013 1344   GLUCOSE 107 05/12/2014 1412   GLUCOSE 70 11/26/2013 1344   BUN 10 05/12/2014 1412   BUN 15 11/26/2013 1344   CREATININE 0.8 05/12/2014 1412   CREATININE 0.97 11/26/2013 1344   CALCIUM 9.7 05/12/2014 1412   CALCIUM 8.9 11/26/2013 1344   PROT 7.3 05/12/2014 1412   PROT 6.5 11/26/2013 1344   ALBUMIN 4.5 11/26/2013 1344   AST 42* 05/12/2014 1412   AST 37 11/26/2013 1344   ALT 53* 05/12/2014 1412   ALT 57* 11/26/2013 1344   ALKPHOS 58 05/12/2014 1412   ALKPHOS 56 11/26/2013 1344   BILITOT 1.00 05/12/2014 1412   BILITOT 0.8 11/26/2013 1344    INo results found for: SPEP, UPEP Lab Results  Component Value Date   WBC 5.5 06/02/2014   NEUTROABS 3.2 06/02/2014   HGB 13.8 06/02/2014   HCT 43.7 06/02/2014   MCV 75* 06/02/2014   PLT 164 06/02/2014   No results found for: LABCA2 No components found for: [id number] No results for input(s): INR in the last 168 hours.  STUDIES: Dg Chest 2 View  05/14/2014   CLINICAL DATA:  Shortness of breath.  EXAM: CHEST  2 VIEW  COMPARISON:  02/08/2004.  FINDINGS: Mediastinum and hilar structures normal. Lungs are clear. Heart size normal. No pleural effusion or pneumothorax. Postsurgical changes right shoulder.  IMPRESSION: No active cardiopulmonary disease.   Electronically Signed   By: Marcello Moores  Register   On: 05/14/2014 12:13    ASSESSMENT/PLAN: Mr. Kimes is 59 year old gentleman with polycythemia vera. He is feeling much better and is asymptomatic with the polycythemia at this time.   He is doing well on the increased dose of Hydrea. His platelets are now 164.  He will continue taking the Baby aspirin daily. His Hct today is 43.7 so we will not phlebotomize him at this time.  We will see him back in 3 weeks for labs and follow-up.  He knows to call here with any questions or concerns and to go to the ED in the event of an emergency. We can certainly see him sooner if needed.   Eliezer Bottom, NP 06/02/2014 3:27 PM

## 2014-06-03 ENCOUNTER — Telehealth: Payer: Self-pay | Admitting: Hematology & Oncology

## 2014-06-03 NOTE — Telephone Encounter (Signed)
Pt made 2-22 appointment is aware MD wanted him to come back in 3 weeks not 4

## 2014-06-28 ENCOUNTER — Ambulatory Visit (HOSPITAL_BASED_OUTPATIENT_CLINIC_OR_DEPARTMENT_OTHER): Payer: Managed Care, Other (non HMO) | Admitting: Hematology & Oncology

## 2014-06-28 ENCOUNTER — Other Ambulatory Visit (HOSPITAL_BASED_OUTPATIENT_CLINIC_OR_DEPARTMENT_OTHER): Payer: Managed Care, Other (non HMO) | Admitting: Lab

## 2014-06-28 ENCOUNTER — Encounter: Payer: Self-pay | Admitting: Hematology & Oncology

## 2014-06-28 VITALS — BP 145/81 | HR 58 | Temp 98.0°F | Resp 18 | Ht 70.0 in | Wt 206.0 lb

## 2014-06-28 DIAGNOSIS — D45 Polycythemia vera: Secondary | ICD-10-CM

## 2014-06-28 DIAGNOSIS — D751 Secondary polycythemia: Secondary | ICD-10-CM

## 2014-06-28 LAB — CBC WITH DIFFERENTIAL (CANCER CENTER ONLY)
BASO#: 0.4 10*3/uL — ABNORMAL HIGH (ref 0.0–0.2)
BASO%: 4.3 % — ABNORMAL HIGH (ref 0.0–2.0)
EOS%: 3.3 % (ref 0.0–7.0)
Eosinophils Absolute: 0.3 10*3/uL (ref 0.0–0.5)
HCT: 43.9 % (ref 38.7–49.9)
HGB: 13.8 g/dL (ref 13.0–17.1)
LYMPH#: 1.4 10*3/uL (ref 0.9–3.3)
LYMPH%: 17 % (ref 14.0–48.0)
MCH: 24.8 pg — ABNORMAL LOW (ref 28.0–33.4)
MCHC: 31.4 g/dL — ABNORMAL LOW (ref 32.0–35.9)
MCV: 79 fL — ABNORMAL LOW (ref 82–98)
MONO#: 0.5 10*3/uL (ref 0.1–0.9)
MONO%: 6.3 % (ref 0.0–13.0)
NEUT#: 5.8 10*3/uL (ref 1.5–6.5)
NEUT%: 69.1 % (ref 40.0–80.0)
Platelets: 280 10*3/uL (ref 145–400)
RBC: 5.57 10*6/uL (ref 4.20–5.70)
RDW: 22.9 % — ABNORMAL HIGH (ref 11.1–15.7)
WBC: 8.4 10*3/uL (ref 4.0–10.0)

## 2014-06-28 NOTE — Progress Notes (Signed)
Hematology and Oncology Follow Up Visit  [name] [name] [id number] [birth year] 59 y.o. 06/28/2014   Principle Diagnosis:  Polycythemia vera- JAK2 (+)  Current Therapy:    Phlebotomy to maintain hematocrit below 45%  Hydrea 500 mg by mouth daily  Aspirin 81 mg by mouth daily     Interim History:  [name] is back for followup. He is complaining of some fatigue. He has a headache. The headache is frontal in nature. There is no time a day in which it bothers him. Is not associated with any visual issues. He's had no nausea with it.  He sees his eye doctor tomorrow. He has a chalazion on the right eye. He is not sure if this may not be causing some of the headache issue.  He is fatigued. This might be from iron deficiency. His last ferritin was 8 that we checked. Unfortunately, we cannot give him any iron because of his polycythemia.  He's had no abdominal pain. There's been no change in bowel or bladder habits.  He's had no leg swelling. He's had no rashes. He's had no pruritus.  He's had no cough or shortness of breath.  Overall, his performance status is ECOG 1. .  Medications:  Current outpatient prescriptions:  .  acetaminophen (TYLENOL) 500 MG tablet, Take 1,000 mg by mouth every 6 (six) hours as needed. For pain , Disp: , Rfl:  .  aspirin 81 MG tablet, Take 81 mg by mouth daily.  , Disp: , Rfl:  .  BuPROPion HCl (WELLBUTRIN PO), Take by mouth daily. TAKES 1/2  TAB DAILY, Disp: , Rfl:  .  clidinium-chlordiazePOXIDE (LIBRAX) 5-2.5 MG per capsule, TAKE 1 CAPSULE BY MOUTH AS NEEDED, Disp: 60 capsule, Rfl: 1 .  diphenoxylate-atropine (LOMOTIL) 2.5-0.025 MG per tablet, Take 1 tablet by mouth as needed for diarrhea or loose stools., Disp: , Rfl:  .  doxycycline (VIBRA-TABS) 100 MG tablet, Take 100 mg by mouth every 12 (twelve) hours. Start 12-22-- take for next 1 to 2 months, Disp: , Rfl: 1 .  hydroxyurea (HYDREA) 500 MG capsule, TAKE 1 CAPSULES BY MOUTH EVERY DAY (Patient  taking differently: Take 500 mg by mouth 2 (two) times daily. TAKE 2 CAPSULES BY MOUTH EVERY DAY), Disp: 60 capsule, Rfl: 9 .  hyoscyamine (LEVBID) 0.375 MG 12 hr tablet, Take 0.375 mg by mouth as needed., Disp: , Rfl:  .  meloxicam (MOBIC) 15 MG tablet, Take 15 mg by mouth as needed., Disp: , Rfl:  .  propranolol ER (INDERAL LA) 120 MG 24 hr capsule, TAKE 1 CAPSULE (80 MG TOTAL) BY MOUTH DAILY. (Patient taking differently: Take 120 mg by mouth daily. TAKE 1 CAPSULE (120 MG TOTAL) BY MOUTH DAILY.), Disp: 30 capsule, Rfl: 5 .  psyllium (METAMUCIL) 58.6 % powder, Take 1 packet by mouth daily., Disp: , Rfl:  .  zolpidem (AMBIEN CR) 12.5 MG CR tablet, Take by mouth as needed. For sleep pt usually takes 1/2 tablet, Disp: , Rfl:  .  [DISCONTINUED] propranolol (INNOPRAN XL) 80 MG 24 hr capsule, Take 1 capsule (80 mg total) by mouth at bedtime., Disp: 30 capsule, Rfl: 0  Allergies: No Known Allergies  Past Medical History, Surgical history, Social history, and Family History were reviewed and updated.  Review of Systems: As above  Physical Exam:  height is 5\' 10"  (1.778 m) and weight is 206 lb (93.441 kg). His oral temperature is 98 F (36.7 C). His blood pressure is 145/81 and his pulse is 58.  His respiration is 18.   Well-developed and well-nourished white gentleman. Head and neck exam shows no ocular or oral lesions. He does have the chalazion on both the lower eyelid and upper eyelid on the right eye. 5 positive He has no scleral icterus. He has no adenopathy in the neck. Lungs are clear. Cardiac exam regular rate and rhythm with no murmurs, rubs or bruits. Abdomen is soft. He has good bowel sounds. There is no fluid wave. There is no palpable liver or spleen tip. Extremities shows no clubbing, cyanosis or edema. No palpable venous cord is noted. Skin exam no rashes, ecchymosis or petechia. Neurological exam shows no focal neurological deficits.  Lab Results  Component Value Date   WBC 8.4  06/28/2014   HGB 13.8 06/28/2014   HCT 43.9 06/28/2014   MCV 79* 06/28/2014   PLT 280 06/28/2014     Chemistry      Component Value Date/Time   NA 143 05/12/2014 1412   NA 138 11/26/2013 1344   K 3.7 05/12/2014 1412   K 4.4 11/26/2013 1344   CL 105 05/12/2014 1412   CL 102 11/26/2013 1344   CO2 29 05/12/2014 1412   CO2 28 11/26/2013 1344   BUN 10 05/12/2014 1412   BUN 15 11/26/2013 1344   CREATININE 0.8 05/12/2014 1412   CREATININE 0.97 11/26/2013 1344      Component Value Date/Time   CALCIUM 9.7 05/12/2014 1412   CALCIUM 8.9 11/26/2013 1344   ALKPHOS 58 05/12/2014 1412   ALKPHOS 56 11/26/2013 1344   AST 42* 05/12/2014 1412   AST 37 11/26/2013 1344   ALT 53* 05/12/2014 1412   ALT 57* 11/26/2013 1344   BILITOT 1.00 05/12/2014 1412   BILITOT 0.8 11/26/2013 1344         Impression and Plan: [name] [name] is 59 year old gentleman with polycythemia vera.  I'm not sure what to make of his complaints. It is possible that being iron deficient can cause and had the fatigue. He still has the eye problems. He sees his eye doctor tomorrow.  We will decrease his Hydrea to 500 mg daily.. This might make a difference and might help his fatigue.  I told him to start taking vitamin D. I told him that 2000 units of vitamin D daily might help with some of the fatigue and arthralgias that he has.  I also think his Inderal LA these to be increased. I will put him on 120 mg a day. His heart rate is quite high for the current dose that he is on. His blood is a little on the high side also. He will be phlebotomized today.  We will go ahead and plan to phlebotomize him. I will then get him back in 3 weeks. I think we have to closely monitor him right now.  I spent about 30 minutes with him. [name], MD 2/22/20164:16 PM

## 2014-06-29 LAB — IRON AND TIBC CHCC
%SAT: 12 % — ABNORMAL LOW (ref 20–55)
Iron: 48 ug/dL (ref 42–163)
TIBC: 413 ug/dL — ABNORMAL HIGH (ref 202–409)
UIBC: 365 ug/dL (ref 117–376)

## 2014-06-29 LAB — FERRITIN CHCC: Ferritin: 11 ng/ml — ABNORMAL LOW (ref 22–316)

## 2014-07-29 ENCOUNTER — Ambulatory Visit: Payer: Managed Care, Other (non HMO) | Admitting: Hematology & Oncology

## 2014-07-29 ENCOUNTER — Other Ambulatory Visit: Payer: Managed Care, Other (non HMO) | Admitting: Lab

## 2014-08-06 ENCOUNTER — Ambulatory Visit: Payer: Managed Care, Other (non HMO)

## 2014-08-06 ENCOUNTER — Other Ambulatory Visit (HOSPITAL_BASED_OUTPATIENT_CLINIC_OR_DEPARTMENT_OTHER): Payer: Managed Care, Other (non HMO)

## 2014-08-06 ENCOUNTER — Ambulatory Visit (HOSPITAL_BASED_OUTPATIENT_CLINIC_OR_DEPARTMENT_OTHER): Payer: Managed Care, Other (non HMO) | Admitting: Hematology & Oncology

## 2014-08-06 ENCOUNTER — Encounter: Payer: Self-pay | Admitting: Hematology & Oncology

## 2014-08-06 VITALS — BP 136/89 | HR 64 | Temp 98.0°F | Resp 18 | Ht 70.0 in | Wt 204.0 lb

## 2014-08-06 DIAGNOSIS — D751 Secondary polycythemia: Secondary | ICD-10-CM

## 2014-08-06 DIAGNOSIS — D45 Polycythemia vera: Secondary | ICD-10-CM

## 2014-08-06 LAB — COMPREHENSIVE METABOLIC PANEL
ALT: 33 U/L (ref 0–53)
AST: 30 U/L (ref 0–37)
Albumin: 4.4 g/dL (ref 3.5–5.2)
Alkaline Phosphatase: 51 U/L (ref 39–117)
BUN: 15 mg/dL (ref 6–23)
CO2: 26 mEq/L (ref 19–32)
Calcium: 8.9 mg/dL (ref 8.4–10.5)
Chloride: 104 mEq/L (ref 96–112)
Creatinine, Ser: 0.87 mg/dL (ref 0.50–1.35)
Glucose, Bld: 81 mg/dL (ref 70–99)
Potassium: 4.1 mEq/L (ref 3.5–5.3)
Sodium: 138 mEq/L (ref 135–145)
Total Bilirubin: 0.7 mg/dL (ref 0.2–1.2)
Total Protein: 6.4 g/dL (ref 6.0–8.3)

## 2014-08-06 LAB — CBC WITH DIFFERENTIAL (CANCER CENTER ONLY)
BASO#: 0.3 10*3/uL — ABNORMAL HIGH (ref 0.0–0.2)
BASO%: 3.3 % — ABNORMAL HIGH (ref 0.0–2.0)
EOS%: 4.2 % (ref 0.0–7.0)
Eosinophils Absolute: 0.4 10*3/uL (ref 0.0–0.5)
HCT: 44.3 % (ref 38.7–49.9)
HGB: 14.1 g/dL (ref 13.0–17.1)
LYMPH#: 1.5 10*3/uL (ref 0.9–3.3)
LYMPH%: 16.6 % (ref 14.0–48.0)
MCH: 25.3 pg — ABNORMAL LOW (ref 28.0–33.4)
MCHC: 31.8 g/dL — ABNORMAL LOW (ref 32.0–35.9)
MCV: 79 fL — ABNORMAL LOW (ref 82–98)
MONO#: 0.8 10*3/uL (ref 0.1–0.9)
MONO%: 9.1 % (ref 0.0–13.0)
NEUT#: 6 10*3/uL (ref 1.5–6.5)
NEUT%: 66.8 % (ref 40.0–80.0)
Platelets: 389 10*3/uL (ref 145–400)
RBC: 5.58 10*6/uL (ref 4.20–5.70)
RDW: 20.2 % — ABNORMAL HIGH (ref 11.1–15.7)
WBC: 9 10*3/uL (ref 4.0–10.0)

## 2014-08-06 NOTE — Progress Notes (Signed)
Hematology and Oncology Follow Up Visit  [name] [name] [id number] [birth year] 59 y.o. 08/06/2014   Principle Diagnosis:  Polycythemia vera- JAK2 (+)  Current Therapy:    Phlebotomy to maintain hematocrit below 45%  Hydrea 500 mg by mouth daily  Aspirin 81 mg by mouth daily     Interim History:  Mr.  [name] [name] is back for followup. He is doing a little bit better. Unfortunately, over the Easter weekend, a brother of his had a grand mal seizure. Thank you, he was able to get help. He went to the hospital. He has a history of cerebral palsy. They've really cannot find any source for the seizure.  [name] had the chalazion on his right lower eyelid removed. This is done at [facility]. Is feeling better now.  He is working. He's having no problems with work. He's having no increasing fatigue. We'll last saw him, he was having a lot of fatigue. This seems to be better.  The big news is that he has wife are going up to [facility] the end of May to see their daughter and son-in-law. They will then be going on a cruise up to [place]. He is looking for to this.  He's had no fever. His had no cough. He's had no leg swelling. He's had no rashes.   He's had no cough or shortness of breath.  Overall, his performance status is ECOG 1. .  Medications:  Current outpatient prescriptions:  .  acetaminophen (TYLENOL) 500 MG tablet, Take 1,000 mg by mouth every 6 (six) hours as needed. For pain , Disp: , Rfl:  .  aspirin 81 MG tablet, Take 81 mg by mouth daily.  , Disp: , Rfl:  .  BuPROPion HCl (WELLBUTRIN PO), Take by mouth daily. TAKES 1/2  TAB DAILY, Disp: , Rfl:  .  clidinium-chlordiazePOXIDE (LIBRAX) 5-2.5 MG per capsule, TAKE 1 CAPSULE BY MOUTH AS NEEDED, Disp: 60 capsule, Rfl: 1 .  diphenoxylate-atropine (LOMOTIL) 2.5-0.025 MG per tablet, Take 1 tablet by mouth as needed for diarrhea or loose stools., Disp: , Rfl:  .  doxycycline (VIBRA-TABS) 100 MG tablet, Take 100 mg by mouth every  12 (twelve) hours. Start 12-22-- take for next 1 to 2 months, Disp: , Rfl: 1 .  hydroxyurea (HYDREA) 500 MG capsule, TAKE 1 CAPSULES BY MOUTH EVERY DAY (Patient taking differently: Take 500 mg by mouth 2 (two) times daily. TAKE 2 CAPSULES BY MOUTH EVERY DAY), Disp: 60 capsule, Rfl: 9 .  hyoscyamine (LEVBID) 0.375 MG 12 hr tablet, Take 0.375 mg by mouth as needed., Disp: , Rfl:  .  meloxicam (MOBIC) 15 MG tablet, Take 15 mg by mouth as needed., Disp: , Rfl:  .  propranolol ER (INDERAL LA) 120 MG 24 hr capsule, TAKE 1 CAPSULE (80 MG TOTAL) BY MOUTH DAILY. (Patient taking differently: Take 120 mg by mouth daily. TAKE 1 CAPSULE (120 MG TOTAL) BY MOUTH DAILY.), Disp: 30 capsule, Rfl: 5 .  psyllium (METAMUCIL) 58.6 % powder, Take 1 packet by mouth daily., Disp: , Rfl:  .  zolpidem (AMBIEN CR) 12.5 MG CR tablet, Take by mouth as needed. For sleep pt usually takes 1/2 tablet, Disp: , Rfl:  .  [DISCONTINUED] propranolol (INNOPRAN XL) 80 MG 24 hr capsule, Take 1 capsule (80 mg total) by mouth at bedtime., Disp: 30 capsule, Rfl: 0  Allergies: No Known Allergies  Past Medical History, Surgical history, Social history, and Family History were reviewed and updated.  Review of Systems: As above  Physical Exam:  height is 5\' 10"  (1.778 m) and weight is 204 lb (92.534 kg). His oral temperature is 98 F (36.7 C). His blood pressure is 136/89 and his pulse is 64. His respiration is 18.   Well-developed and well-nourished white gentleman. Head and neck exam shows no ocular or oral lesions. He does have the surgical incision on the lower eyelid and on the right eye. He has no scleral icterus. He has no adenopathy in the neck. Lungs are clear. Cardiac exam regular rate and rhythm with no murmurs, rubs or bruits. Abdomen is soft. He has good bowel sounds. There is no fluid wave. There is no palpable liver or spleen tip. Extremities shows no clubbing, cyanosis or edema. No palpable venous cord is noted. Skin exam no  rashes, ecchymosis or petechia. Neurological exam shows no focal neurological deficits.  Lab Results  Component Value Date   WBC 9.0 08/06/2014   HGB 14.1 08/06/2014   HCT 44.3 08/06/2014   MCV 79* 08/06/2014   PLT 389 08/06/2014     Chemistry      Component Value Date/Time   NA 143 05/12/2014 1412   NA 138 11/26/2013 1344   K 3.7 05/12/2014 1412   K 4.4 11/26/2013 1344   CL 105 05/12/2014 1412   CL 102 11/26/2013 1344   CO2 29 05/12/2014 1412   CO2 28 11/26/2013 1344   BUN 10 05/12/2014 1412   BUN 15 11/26/2013 1344   CREATININE 0.8 05/12/2014 1412   CREATININE 0.97 11/26/2013 1344      Component Value Date/Time   CALCIUM 9.7 05/12/2014 1412   CALCIUM 8.9 11/26/2013 1344   ALKPHOS 58 05/12/2014 1412   ALKPHOS 56 11/26/2013 1344   AST 42* 05/12/2014 1412   AST 37 11/26/2013 1344   ALT 53* 05/12/2014 1412   ALT 57* 11/26/2013 1344   BILITOT 1.00 05/12/2014 1412   BILITOT 0.8 11/26/2013 1344         Impression and Plan: [name] is 59 year old gentleman with polycythemia vera.  He does not need to be phlebotomized today.  Since he will be going up to [place] state and going on a cruise in of May, we will try to coordinate his appointments here so that we get him seen before he goes out [place]. I want to make sure that his blood is doing well when he does travel.  I'm glad that his right eye is doing well. He went to [facility] and have this operated on. This is doing much better.  He has had no problems from the polycythemia. He's had no couple occasions from the polycythemia so this is a blessing. I will not change his Hydrea dose.  I will see him back in one month.  I spent about 30 minutes with him. [name], MD 4/1/20165:18 PM

## 2014-08-06 NOTE — Progress Notes (Signed)
No phlebotomy today per dr. ennever 

## 2014-08-09 LAB — IRON AND TIBC CHCC
%SAT: 8 % — ABNORMAL LOW (ref 20–55)
Iron: 32 ug/dL — ABNORMAL LOW (ref 42–163)
TIBC: 416 ug/dL — ABNORMAL HIGH (ref 202–409)
UIBC: 384 ug/dL — ABNORMAL HIGH (ref 117–376)

## 2014-08-09 LAB — FERRITIN CHCC: Ferritin: 9 ng/mL — ABNORMAL LOW (ref 22–316)

## 2014-08-23 ENCOUNTER — Emergency Department (HOSPITAL_BASED_OUTPATIENT_CLINIC_OR_DEPARTMENT_OTHER): Payer: Managed Care, Other (non HMO)

## 2014-08-23 ENCOUNTER — Encounter (HOSPITAL_BASED_OUTPATIENT_CLINIC_OR_DEPARTMENT_OTHER): Payer: Self-pay | Admitting: *Deleted

## 2014-08-23 ENCOUNTER — Emergency Department (HOSPITAL_BASED_OUTPATIENT_CLINIC_OR_DEPARTMENT_OTHER)
Admission: EM | Admit: 2014-08-23 | Discharge: 2014-08-23 | Disposition: A | Discharge status: Home / Self Care | Payer: Managed Care, Other (non HMO) | Attending: Emergency Medicine | Admitting: Emergency Medicine

## 2014-08-23 DIAGNOSIS — H00019 Hordeolum externum unspecified eye, unspecified eyelid: Secondary | ICD-10-CM | POA: Diagnosis not present

## 2014-08-23 DIAGNOSIS — Z87442 Personal history of urinary calculi: Secondary | ICD-10-CM | POA: Insufficient documentation

## 2014-08-23 DIAGNOSIS — Y999 Unspecified external cause status: Secondary | ICD-10-CM | POA: Insufficient documentation

## 2014-08-23 DIAGNOSIS — Y929 Unspecified place or not applicable: Secondary | ICD-10-CM | POA: Insufficient documentation

## 2014-08-23 DIAGNOSIS — Z8719 Personal history of other diseases of the digestive system: Secondary | ICD-10-CM | POA: Insufficient documentation

## 2014-08-23 DIAGNOSIS — Z7982 Long term (current) use of aspirin: Secondary | ICD-10-CM | POA: Insufficient documentation

## 2014-08-23 DIAGNOSIS — Z79899 Other long term (current) drug therapy: Secondary | ICD-10-CM | POA: Insufficient documentation

## 2014-08-23 DIAGNOSIS — Z862 Personal history of diseases of the blood and blood-forming organs and certain disorders involving the immune mechanism: Secondary | ICD-10-CM | POA: Diagnosis not present

## 2014-08-23 DIAGNOSIS — S8991XA Unspecified injury of right lower leg, initial encounter: Secondary | ICD-10-CM | POA: Diagnosis present

## 2014-08-23 DIAGNOSIS — Z8639 Personal history of other endocrine, nutritional and metabolic disease: Secondary | ICD-10-CM | POA: Insufficient documentation

## 2014-08-23 DIAGNOSIS — I499 Cardiac arrhythmia, unspecified: Secondary | ICD-10-CM | POA: Insufficient documentation

## 2014-08-23 DIAGNOSIS — X58XXXA Exposure to other specified factors, initial encounter: Secondary | ICD-10-CM | POA: Diagnosis not present

## 2014-08-23 DIAGNOSIS — Y939 Activity, unspecified: Secondary | ICD-10-CM | POA: Insufficient documentation

## 2014-08-23 DIAGNOSIS — S86911A Strain of unspecified muscle(s) and tendon(s) at lower leg level, right leg, initial encounter: Secondary | ICD-10-CM | POA: Insufficient documentation

## 2014-08-23 DIAGNOSIS — T148XXA Other injury of unspecified body region, initial encounter: Secondary | ICD-10-CM

## 2014-08-23 MED ORDER — MELOXICAM 15 MG PO TABS: 15.0000 mg | ORAL_TABLET | Freq: Every day | ORAL | Status: DC

## 2014-08-23 MED ORDER — KETOROLAC TROMETHAMINE 60 MG/2ML IM SOLN
60.0000 mg | Freq: Once | INTRAMUSCULAR | Status: AC
  Administered 2014-08-23: 60 mg via INTRAMUSCULAR
  Filled 2014-08-23: qty 2

## 2014-08-23 NOTE — ED Provider Notes (Signed)
CSN: [id number]     Arrival date & time 08/23/14  2023 History  This chart was scribed for [name] Romano, MD by Tula Nakayama, ED Scribe. This patient was seen in room MH02/MH02 and the patient's care was started at 10:55 PM.    Chief Complaint  Patient presents with  . Leg Pain   Patient is a 59 y.o. male presenting with leg pain. The history is provided by the patient. No language interpreter was used.  Leg Pain Location:  Leg Time since incident:  2 days Injury: no   Leg location:  R lower leg Pain details:    Quality:  Aching   Radiates to:  Does not radiate   Severity:  Moderate   Onset quality:  Gradual   Duration:  2 days   Timing:  Constant   Progression:  Unchanged Chronicity:  New Dislocation: no   Prior injury to area:  Unable to specify Relieved by:  Nothing Worsened by:  Nothing tried Ineffective treatments:  Acetaminophen Associated symptoms: no back pain, no decreased ROM, no fever, no numbness, no swelling and no tingling   Risk factors: no concern for non-accidental trauma     HPI Comments: [name] [name] is a 59 y.o. male who presents to the Emergency Department complaining of constant, moderate pain in his lower right leg that started 2 days ago. He tried Aleve last night with no relief. Pt does not recall any injuries, but states he was working in the yard prior to the onset of pain. He was wearing tennis shoes. Pt denies recent long travel. He denies ankle swelling as an associated symptom.  Past Medical History  Diagnosis Date  . Other and unspecified hyperlipidemia   . Diverticulosis of colon (without mention of hemorrhage)   . Polycythemia, secondary   . Nephrolithiasis   . IBS (irritable bowel syndrome)   . Hemorrhoids   . Irregular heartbeat   . Stye     blocker duct right eye lid  . Perirectal fistula    Past Surgical History  Procedure Laterality Date  . Rotator cuff repair      rt.  . Vasectomy    . Retinal detachment surgery      Family History  Problem Relation Age of Onset  . Aortic aneurysm Father   . Heart attack Brother   . Colon cancer Neg Hx   . Esophageal cancer Neg Hx   . Pancreatic cancer Neg Hx   . Prostate cancer Neg Hx   . Rectal cancer Neg Hx   . Stomach cancer Neg Hx    History  Substance Use Topics  . Smoking status: Never Smoker   . Smokeless tobacco: Never Used     Comment: never used tobacco  . Alcohol Use: 0.0 oz/week    0 Standard drinks or equivalent per week    Review of Systems  Constitutional: Negative for fever.  Cardiovascular: Negative for chest pain, palpitations and leg swelling.  Musculoskeletal: Positive for arthralgias. Negative for back pain.  Neurological: Negative for numbness.  All other systems reviewed and are negative.     Allergies  Review of patient's allergies indicates no known allergies.  Home Medications   Prior to Admission medications   Medication Sig Start Date End Date Taking? Authorizing Provider  acetaminophen (TYLENOL) 500 MG tablet Take 1,000 mg by mouth every 6 (six) hours as needed. For pain     Historical Provider, MD  aspirin 81 MG tablet Take 81 mg by mouth  daily.      Historical Provider, MD  BuPROPion HCl (WELLBUTRIN PO) Take by mouth daily. TAKES 1/2  TAB DAILY 01/11/14   Historical Provider, MD  clidinium-chlordiazePOXIDE (LIBRAX) 5-2.5 MG per capsule TAKE 1 CAPSULE BY MOUTH AS NEEDED 03/04/14   Irene Shipper, MD  diphenoxylate-atropine (LOMOTIL) 2.5-0.025 MG per tablet Take 1 tablet by mouth as needed for diarrhea or loose stools.    Historical Provider, MD  hydroxyurea (HYDREA) 500 MG capsule TAKE 1 CAPSULES BY MOUTH EVERY DAY Patient taking differently: Take 500 mg by mouth 2 (two) times daily. TAKE 2 CAPSULES BY MOUTH EVERY DAY 02/18/14   [name], MD  hyoscyamine (LEVBID) 0.375 MG 12 hr tablet Take 0.375 mg by mouth as needed. 08/12/13   Irene Shipper, MD  meloxicam (MOBIC) 15 MG tablet Take 15 mg by mouth as needed. 03/04/13    [name], MD  propranolol ER (INDERAL LA) 120 MG 24 hr capsule TAKE 1 CAPSULE (80 MG TOTAL) BY MOUTH DAILY. Patient taking differently: Take 120 mg by mouth daily. TAKE 1 CAPSULE (120 MG TOTAL) BY MOUTH DAILY. 05/12/14   [name], MD  psyllium (METAMUCIL) 58.6 % powder Take 1 packet by mouth daily.    Historical Provider, MD  zolpidem (AMBIEN CR) 12.5 MG CR tablet Take by mouth as needed. For sleep pt usually takes 1/2 tablet    Historical Provider, MD   BP 136/91 mmHg  Pulse 72  Temp(Src) 99.4 F (37.4 C) (Oral)  Resp 16  Ht 6' (1.829 m)  Wt 195 lb (88.451 kg)  BMI 26.44 kg/m2  SpO2 100% Physical Exam  Constitutional: He is oriented to person, place, and time. He appears well-developed and well-nourished. No distress.  HENT:  Head: Normocephalic and atraumatic.  Right Ear: External ear normal.  Mouth/Throat: Oropharynx is clear and moist.  Eyes: Conjunctivae and EOM are normal. Pupils are equal, round, and reactive to light.  Neck: Normal range of motion. Neck supple. No tracheal deviation present.  Cardiovascular: Normal rate, regular rhythm and normal heart sounds.   Pulmonary/Chest: Effort normal and breath sounds normal. No respiratory distress. He has no wheezes. He has no rales.  Abdominal: Soft. Bowel sounds are normal. There is no tenderness. There is no rebound and no guarding.  Musculoskeletal: Normal range of motion.       Right ankle: Normal. Achilles tendon normal.       Right lower leg: He exhibits no bony tenderness, no swelling, no edema and no deformity.       Right foot: Normal.  3+ dorsalis pedis; 3+ posterior tibial; intact Achilles; right foot neurovascularly intact; no cords; no edema; no laxity of the knee to varus or valgus stress; negative anterior and posterior drawer test; no step-offs of tibial plateau  Neurological: He is alert and oriented to person, place, and time.  Skin: Skin is warm and dry.  Psychiatric: He has a normal mood and  affect. His behavior is normal.  Nursing note and vitals reviewed.   ED Course  Procedures   DIAGNOSTIC STUDIES: Oxygen Saturation is 100% on RA, normal by my interpretation.    COORDINATION OF CARE: 10:58 PM Discussed treatment plan with pt at bedside and pt agreed to plan.  Labs Review Labs Reviewed - No data to display  Imaging Review US Venous Img Lower Unilateral Right  08/23/2014   CLINICAL DATA:  Polycythemia vera.  Right upper calf pain.  EXAM: Right LOWER EXTREMITY VENOUS DOPPLER  ULTRASOUND  TECHNIQUE: Gray-scale sonography with graded compression, as well as color Doppler and duplex ultrasound were performed to evaluate the lower extremity deep venous systems from the level of the common femoral vein and including the common femoral, femoral, profunda femoral, popliteal and calf veins including the posterior tibial, peroneal and gastrocnemius veins when visible. The superficial great saphenous vein was also interrogated. Spectral Doppler was utilized to evaluate flow at rest and with distal augmentation maneuvers in the common femoral, femoral and popliteal veins.  COMPARISON:  None.  FINDINGS: Contralateral Common Femoral Vein: Respiratory phasicity is normal and symmetric with the symptomatic side. No evidence of thrombus. Normal compressibility.  Common Femoral Vein: No evidence of thrombus. Normal compressibility, respiratory phasicity and response to augmentation.  Saphenofemoral Junction: No evidence of thrombus. Normal compressibility and flow on color Doppler imaging.  Profunda Femoral Vein: No evidence of thrombus. Normal compressibility and flow on color Doppler imaging.  Femoral Vein: No evidence of thrombus. Normal compressibility, respiratory phasicity and response to augmentation.  Popliteal Vein: No evidence of thrombus. Normal compressibility, respiratory phasicity and response to augmentation.  Calf Veins: Limited assessment.  Normal compressibility.  Superficial Great  Saphenous Vein: No evidence of thrombus. Normal compressibility.  Venous Reflux:  None.  Other Findings: No appreciable abnormality corresponding to the site of pain.  IMPRESSION: No evidence of deep venous thrombosis.   Electronically Signed   By: Carlos Levering M.D.   On: 08/23/2014 22:06     EKG Interpretation None      MDM   Final diagnoses:  None   No DVT pain is muscular in nature suspect strain from working in the yard will treat symptomatically. I personally performed the services described in this documentation, which was scribed in my presence. The recorded information has been reviewed and is accurate.     Veatrice Kells, MD 08/24/14 [phone]

## 2014-08-23 NOTE — Discharge Instructions (Signed)

## 2014-08-23 NOTE — ED Notes (Signed)
Pt c/o left lower leg and calf pain x 2 days

## 2014-08-24 ENCOUNTER — Encounter (HOSPITAL_BASED_OUTPATIENT_CLINIC_OR_DEPARTMENT_OTHER): Payer: Self-pay | Admitting: Emergency Medicine

## 2014-09-09 ENCOUNTER — Encounter: Payer: Self-pay | Admitting: Hematology & Oncology

## 2014-09-09 ENCOUNTER — Ambulatory Visit (HOSPITAL_BASED_OUTPATIENT_CLINIC_OR_DEPARTMENT_OTHER): Payer: Managed Care, Other (non HMO) | Admitting: Hematology & Oncology

## 2014-09-09 ENCOUNTER — Other Ambulatory Visit (HOSPITAL_BASED_OUTPATIENT_CLINIC_OR_DEPARTMENT_OTHER): Payer: Managed Care, Other (non HMO)

## 2014-09-09 ENCOUNTER — Ambulatory Visit (HOSPITAL_BASED_OUTPATIENT_CLINIC_OR_DEPARTMENT_OTHER): Payer: Managed Care, Other (non HMO)

## 2014-09-09 VITALS — BP 141/79 | HR 64 | Temp 98.5°F | Resp 18 | Ht 72.0 in | Wt 201.0 lb

## 2014-09-09 VITALS — BP 136/90 | HR 59 | Resp 18

## 2014-09-09 DIAGNOSIS — D751 Secondary polycythemia: Secondary | ICD-10-CM

## 2014-09-09 LAB — CBC WITH DIFFERENTIAL (CANCER CENTER ONLY)
BASO#: 0.4 10*3/uL — ABNORMAL HIGH (ref 0.0–0.2)
BASO%: 3.5 % — ABNORMAL HIGH (ref 0.0–2.0)
EOS%: 4.1 % (ref 0.0–7.0)
Eosinophils Absolute: 0.4 10*3/uL (ref 0.0–0.5)
HCT: 46.9 % (ref 38.7–49.9)
HGB: 14.9 g/dL (ref 13.0–17.1)
LYMPH#: 1.4 10*3/uL (ref 0.9–3.3)
LYMPH%: 12.9 % — ABNORMAL LOW (ref 14.0–48.0)
MCH: 25.2 pg — ABNORMAL LOW (ref 28.0–33.4)
MCHC: 31.8 g/dL — ABNORMAL LOW (ref 32.0–35.9)
MCV: 79 fL — ABNORMAL LOW (ref 82–98)
MONO#: 0.9 10*3/uL (ref 0.1–0.9)
MONO%: 8.2 % (ref 0.0–13.0)
NEUT#: 7.5 10*3/uL — ABNORMAL HIGH (ref 1.5–6.5)
NEUT%: 71.3 % (ref 40.0–80.0)
Platelets: 596 10*3/uL — ABNORMAL HIGH (ref 145–400)
RBC: 5.92 10*6/uL — ABNORMAL HIGH (ref 4.20–5.70)
RDW: 18.7 % — ABNORMAL HIGH (ref 11.1–15.7)
WBC: 10.6 10*3/uL — ABNORMAL HIGH (ref 4.0–10.0)

## 2014-09-09 LAB — COMPREHENSIVE METABOLIC PANEL
ALT: 23 U/L (ref 0–53)
AST: 27 U/L (ref 0–37)
Albumin: 4.4 g/dL (ref 3.5–5.2)
Alkaline Phosphatase: 53 U/L (ref 39–117)
BUN: 12 mg/dL (ref 6–23)
CO2: 22 mEq/L (ref 19–32)
Calcium: 9.1 mg/dL (ref 8.4–10.5)
Chloride: 103 mEq/L (ref 96–112)
Creatinine, Ser: 0.98 mg/dL (ref 0.50–1.35)
Glucose, Bld: 81 mg/dL (ref 70–99)
Potassium: 4.5 mEq/L (ref 3.5–5.3)
Sodium: 139 mEq/L (ref 135–145)
Total Bilirubin: 0.9 mg/dL (ref 0.2–1.2)
Total Protein: 6.6 g/dL (ref 6.0–8.3)

## 2014-09-09 NOTE — Patient Instructions (Signed)

## 2014-09-09 NOTE — Progress Notes (Signed)
Hematology and Oncology Follow Up Visit  [name] [name] [id number] January 19, 1956 59 y.o. 09/09/2014   Principle Diagnosis:  Polycythemia vera- JAK2 (+)  Current Therapy:    Phlebotomy to maintain hematocrit below 45%  Hydrea 1000 mg by mouth daily  Aspirin 81 mg by mouth daily     Interim History:  [name] [name] is back for followup. He is doing a little bit better. He's doing okay. He and his wife are going to go to [place] for a cruise in late May. They will leave 3 weeks from today.  He had a good emergency room a couple weeks ago. He had pain in the right leg. He had a little bit swelling. A Doppler was done which was negative for any thrombus.  He is still working. He's not having any problems with nausea vomiting. He's had no cough. He's had no abdominal pain.  He is on Hydrea 500 mg a day.I will increase his of 2000 mg a day given his blood count being much more elevated.   He's had no cough or shortness of breath.  Overall, his performance status is ECOG 1. .  Medications:  Current outpatient prescriptions:  .  acetaminophen (TYLENOL) 500 MG tablet, Take 1,000 mg by mouth every 6 (six) hours as needed. For pain , Disp: , Rfl:  .  aspirin 81 MG tablet, Take 81 mg by mouth daily.  , Disp: , Rfl:  .  BuPROPion HCl (WELLBUTRIN PO), Take by mouth daily. TAKES 1/2  TAB DAILY, Disp: , Rfl:  .  clidinium-chlordiazePOXIDE (LIBRAX) 5-2.5 MG per capsule, TAKE 1 CAPSULE BY MOUTH AS NEEDED, Disp: 60 capsule, Rfl: 1 .  diphenoxylate-atropine (LOMOTIL) 2.5-0.025 MG per tablet, Take 1 tablet by mouth as needed for diarrhea or loose stools., Disp: , Rfl:  .  doxycycline (PERIOSTAT) 20 MG tablet, Take 20 mg by mouth 2 (two) times daily., Disp: , Rfl: 11 .  hydroxyurea (HYDREA) 500 MG capsule, TAKE 1 CAPSULES BY MOUTH EVERY DAY (Patient taking differently: Take 500 mg by mouth 2 (two) times daily. ), Disp: 60 capsule, Rfl: 9 .  hyoscyamine (LEVBID) 0.375 MG 12 hr tablet, Take 0.375 mg by  mouth as needed., Disp: , Rfl:  .  meloxicam (MOBIC) 15 MG tablet, Take 15 mg by mouth as needed., Disp: , Rfl:  .  propranolol ER (INDERAL LA) 120 MG 24 hr capsule, TAKE 1 CAPSULE (80 MG TOTAL) BY MOUTH DAILY. (Patient taking differently: Take 120 mg by mouth daily. TAKE 1 CAPSULE (120 MG TOTAL) BY MOUTH DAILY.), Disp: 30 capsule, Rfl: 5 .  psyllium (METAMUCIL) 58.6 % powder, Take 1 packet by mouth daily., Disp: , Rfl:  .  TOBRADEX ophthalmic ointment, , Disp: , Rfl: 0 .  zolpidem (AMBIEN CR) 12.5 MG CR tablet, Take by mouth as needed. For sleep pt usually takes 1/2 tablet, Disp: , Rfl:  .  [DISCONTINUED] propranolol (INNOPRAN XL) 80 MG 24 hr capsule, Take 1 capsule (80 mg total) by mouth at bedtime., Disp: 30 capsule, Rfl: 0  Allergies: No Known Allergies  Past Medical History, Surgical history, Social history, and Family History were reviewed and updated.  Review of Systems: As above  Physical Exam:  height is 6' (1.829 m) and weight is 201 lb (91.173 kg). His oral temperature is 98.5 F (36.9 C). His blood pressure is 141/79 and his pulse is 64. His respiration is 18.   Well-developed and well-nourished white gentleman. Head and neck exam shows no ocular or  oral lesions. He does have the surgical incision on the lower eyelid and on the right eye. He has no scleral icterus. He has no adenopathy in the neck. Lungs are clear. Cardiac exam regular rate and rhythm with no murmurs, rubs or bruits. Abdomen is soft. He has good bowel sounds. There is no fluid wave. There is no palpable liver or spleen tip. Extremities shows no clubbing, cyanosis or edema. No palpable venous cord is noted. Skin exam no rashes, ecchymosis or petechia. Neurological exam shows no focal neurological deficits.  Lab Results  Component Value Date   WBC 10.6* 09/09/2014   HGB 14.9 09/09/2014   HCT 46.9 09/09/2014   MCV 79* 09/09/2014   PLT 596* 09/09/2014     Chemistry      Component Value Date/Time   NA 138  08/06/2014 1428   NA 143 05/12/2014 1412   K 4.1 08/06/2014 1428   K 3.7 05/12/2014 1412   CL 104 08/06/2014 1428   CL 105 05/12/2014 1412   CO2 26 08/06/2014 1428   CO2 29 05/12/2014 1412   BUN 15 08/06/2014 1428   BUN 10 05/12/2014 1412   CREATININE 0.87 08/06/2014 1428   CREATININE 0.8 05/12/2014 1412      Component Value Date/Time   CALCIUM 8.9 08/06/2014 1428   CALCIUM 9.7 05/12/2014 1412   ALKPHOS 51 08/06/2014 1428   ALKPHOS 58 05/12/2014 1412   AST 30 08/06/2014 1428   AST 42* 05/12/2014 1412   ALT 33 08/06/2014 1428   ALT 53* 05/12/2014 1412   BILITOT 0.7 08/06/2014 1428   BILITOT 1.00 05/12/2014 1412         Impression and Plan: [name] [name] is 59 year old gentleman with polycythemia vera.  We will go ahead and phlebotomize him today. His been a while since he has been phlebotomized.  I will increase his Hydrea dose to 1000 mg a day. His platelet count has gone up too much for my taste.  I'm sad that he had to go to the emergency room a couple weeks ago. I'm glad that there is no thrombus noted on Doppler.  I want to see him back before he goes on his cruise up to [place]. He leaves in 3 weeks. I want to try to get him back right before the cruise so Ican at least check his blood work. If this is not feasible, then we will plan to see him back in about 6 weeks.  I spent about 30 minutes with him. [name], MD 5/5/20164:02 PM

## 2014-09-09 NOTE — Progress Notes (Signed)
[name] [name] presents today for phlebotomy per MD orders. Phlebotomy procedure started at 1520 and ended at 1545. 516mL removed. Patient observed for 30 minutes after procedure without any incident. Patient tolerated procedure well. IV catheter removed intact.

## 2014-09-23 ENCOUNTER — Other Ambulatory Visit (HOSPITAL_BASED_OUTPATIENT_CLINIC_OR_DEPARTMENT_OTHER): Payer: Managed Care, Other (non HMO)

## 2014-09-23 ENCOUNTER — Other Ambulatory Visit: Payer: Self-pay | Admitting: *Deleted

## 2014-09-23 DIAGNOSIS — D751 Secondary polycythemia: Secondary | ICD-10-CM | POA: Diagnosis not present

## 2014-09-23 LAB — CBC WITH DIFFERENTIAL (CANCER CENTER ONLY)
BASO#: 0.2 10*3/uL (ref 0.0–0.2)
BASO%: 3.4 % — ABNORMAL HIGH (ref 0.0–2.0)
EOS%: 4.3 % (ref 0.0–7.0)
Eosinophils Absolute: 0.3 10*3/uL (ref 0.0–0.5)
HCT: 42.8 % (ref 38.7–49.9)
HGB: 13.9 g/dL (ref 13.0–17.1)
LYMPH#: 1.2 10*3/uL (ref 0.9–3.3)
LYMPH%: 17.2 % (ref 14.0–48.0)
MCH: 25.7 pg — ABNORMAL LOW (ref 28.0–33.4)
MCHC: 32.5 g/dL (ref 32.0–35.9)
MCV: 79 fL — ABNORMAL LOW (ref 82–98)
MONO#: 0.7 10*3/uL (ref 0.1–0.9)
MONO%: 9.2 % (ref 0.0–13.0)
NEUT#: 4.6 10*3/uL (ref 1.5–6.5)
NEUT%: 65.9 % (ref 40.0–80.0)
Platelets: 289 10*3/uL (ref 145–400)
RBC: 5.41 10*6/uL (ref 4.20–5.70)
RDW: 16.9 % — ABNORMAL HIGH (ref 11.1–15.7)
WBC: 7 10*3/uL (ref 4.0–10.0)

## 2014-09-24 ENCOUNTER — Telehealth: Payer: Self-pay | Admitting: *Deleted

## 2014-09-24 NOTE — Telephone Encounter (Signed)
-----   Message from [name], MD sent at 09/23/2014  8:15 PM EDT ----- Call - Platelets are much better!!!  pete

## 2014-10-21 ENCOUNTER — Other Ambulatory Visit: Payer: Managed Care, Other (non HMO)

## 2014-10-21 ENCOUNTER — Ambulatory Visit: Payer: Managed Care, Other (non HMO) | Admitting: Hematology & Oncology

## 2014-10-25 ENCOUNTER — Telehealth: Payer: Self-pay | Admitting: Hematology & Oncology

## 2014-10-25 NOTE — Telephone Encounter (Signed)
Contacted pt regarding future appt on 7/8 per pt request

## 2014-11-12 ENCOUNTER — Other Ambulatory Visit (HOSPITAL_BASED_OUTPATIENT_CLINIC_OR_DEPARTMENT_OTHER): Payer: Managed Care, Other (non HMO)

## 2014-11-12 ENCOUNTER — Other Ambulatory Visit: Payer: Self-pay | Admitting: Hematology & Oncology

## 2014-11-12 ENCOUNTER — Ambulatory Visit (HOSPITAL_BASED_OUTPATIENT_CLINIC_OR_DEPARTMENT_OTHER): Payer: Managed Care, Other (non HMO)

## 2014-11-12 ENCOUNTER — Ambulatory Visit (HOSPITAL_BASED_OUTPATIENT_CLINIC_OR_DEPARTMENT_OTHER): Payer: Managed Care, Other (non HMO) | Admitting: Hematology & Oncology

## 2014-11-12 VITALS — BP 134/77 | HR 64 | Temp 98.2°F | Resp 18

## 2014-11-12 VITALS — BP 138/85 | HR 63 | Temp 98.0°F | Resp 18 | Wt 199.0 lb

## 2014-11-12 DIAGNOSIS — D751 Secondary polycythemia: Secondary | ICD-10-CM

## 2014-11-12 DIAGNOSIS — D45 Polycythemia vera: Secondary | ICD-10-CM

## 2014-11-12 LAB — CBC WITH DIFFERENTIAL (CANCER CENTER ONLY)
BASO#: 0.4 10*3/uL — ABNORMAL HIGH (ref 0.0–0.2)
BASO%: 3.9 % — ABNORMAL HIGH (ref 0.0–2.0)
EOS%: 5.1 % (ref 0.0–7.0)
Eosinophils Absolute: 0.5 10*3/uL (ref 0.0–0.5)
HCT: 46.3 % (ref 38.7–49.9)
HGB: 14.8 g/dL (ref 13.0–17.1)
LYMPH#: 1.4 10*3/uL (ref 0.9–3.3)
LYMPH%: 14 % (ref 14.0–48.0)
MCH: 24.6 pg — ABNORMAL LOW (ref 28.0–33.4)
MCHC: 32 g/dL (ref 32.0–35.9)
MCV: 77 fL — ABNORMAL LOW (ref 82–98)
MONO#: 0.9 10*3/uL (ref 0.1–0.9)
MONO%: 8.3 % (ref 0.0–13.0)
NEUT#: 7.1 10*3/uL — ABNORMAL HIGH (ref 1.5–6.5)
NEUT%: 68.7 % (ref 40.0–80.0)
Platelets: 367 10*3/uL (ref 145–400)
RBC: 6.02 10*6/uL — ABNORMAL HIGH (ref 4.20–5.70)
RDW: 17.7 % — ABNORMAL HIGH (ref 11.1–15.7)
WBC: 10.3 10*3/uL — ABNORMAL HIGH (ref 4.0–10.0)

## 2014-11-12 LAB — CHCC SATELLITE - SMEAR

## 2014-11-12 LAB — COMPREHENSIVE METABOLIC PANEL
ALT: 18 U/L (ref 0–53)
AST: 23 U/L (ref 0–37)
Albumin: 4.6 g/dL (ref 3.5–5.2)
Alkaline Phosphatase: 53 U/L (ref 39–117)
BUN: 11 mg/dL (ref 6–23)
CO2: 26 mEq/L (ref 19–32)
Calcium: 9.1 mg/dL (ref 8.4–10.5)
Chloride: 102 mEq/L (ref 96–112)
Creatinine, Ser: 0.94 mg/dL (ref 0.50–1.35)
Glucose, Bld: 81 mg/dL (ref 70–99)
Potassium: 4.2 mEq/L (ref 3.5–5.3)
Sodium: 139 mEq/L (ref 135–145)
Total Bilirubin: 0.8 mg/dL (ref 0.2–1.2)
Total Protein: 6.6 g/dL (ref 6.0–8.3)

## 2014-11-12 LAB — TECHNOLOGIST REVIEW CHCC SATELLITE

## 2014-11-12 LAB — LACTATE DEHYDROGENASE: LDH: 239 U/L (ref 94–250)

## 2014-11-12 NOTE — Patient Instructions (Signed)

## 2014-11-12 NOTE — Progress Notes (Signed)
Hematology and Oncology Follow Up Visit  [name] [name] [id number] 1955/07/28 59 y.o. 11/12/2014   Principle Diagnosis:  Polycythemia vera- JAK2 (+)  Current Therapy:    Phlebotomy to maintain hematocrit below 45%  Hydrea 1000 mg by mouth daily  Aspirin 81 mg by mouth daily     Interim History:  [name] is back for followup. He is doing well. He had a great time over in [place] and [place]. He was over there for about 21/2 weeks. His daughter and her husband just moved to [place]. He went on a cruise with his wife up to [place] and had a great time.  He is still working. He does not have any problems with fatigue or weakness.  He's had no abdominal pain. He's had no cardiac issues. He's had no palpitations.  He's had no leg swelling. He's had no rashes. He's had no weight loss or weight gain.  He's had no cough or shortness of breath.  Overall, his performance status is ECOG 1. .  Medications:  Current outpatient prescriptions:  .  acetaminophen (TYLENOL) 500 MG tablet, Take 1,000 mg by mouth every 6 (six) hours as needed. For pain , Disp: , Rfl:  .  aspirin 81 MG tablet, Take 81 mg by mouth daily.  , Disp: , Rfl:  .  BuPROPion HCl (WELLBUTRIN PO), Take by mouth daily. TAKES 1/2  TAB DAILY, Disp: , Rfl:  .  clidinium-chlordiazePOXIDE (LIBRAX) 5-2.5 MG per capsule, TAKE 1 CAPSULE BY MOUTH AS NEEDED, Disp: 60 capsule, Rfl: 1 .  diphenoxylate-atropine (LOMOTIL) 2.5-0.025 MG per tablet, Take 1 tablet by mouth as needed for diarrhea or loose stools., Disp: , Rfl:  .  doxycycline (PERIOSTAT) 20 MG tablet, Take 20 mg by mouth 2 (two) times daily., Disp: , Rfl: 11 .  hydroxyurea (HYDREA) 500 MG capsule, TAKE 1 CAPSULES BY MOUTH EVERY DAY (Patient taking differently: Take 500 mg by mouth 2 (two) times daily. ), Disp: 60 capsule, Rfl: 9 .  hyoscyamine (LEVBID) 0.375 MG 12 hr tablet, Take 0.375 mg by mouth as needed., Disp: , Rfl:  .  meloxicam (MOBIC) 15 MG tablet, Take 15 mg by  mouth as needed., Disp: , Rfl:  .  propranolol ER (INDERAL LA) 120 MG 24 hr capsule, TAKE ONE CAPSULE BY MOUTH EVERY DAY, Disp: 30 capsule, Rfl: 5 .  psyllium (METAMUCIL) 58.6 % powder, Take 1 packet by mouth daily., Disp: , Rfl:  .  TOBRADEX ophthalmic ointment, , Disp: , Rfl: 0 .  zolpidem (AMBIEN CR) 12.5 MG CR tablet, Take by mouth as needed. For sleep pt usually takes 1/2 tablet, Disp: , Rfl:  .  [DISCONTINUED] propranolol (INNOPRAN XL) 80 MG 24 hr capsule, Take 1 capsule (80 mg total) by mouth at bedtime., Disp: 30 capsule, Rfl: 0  Allergies: No Known Allergies  Past Medical History, Surgical history, Social history, and Family History were reviewed and updated.  Review of Systems: As above  Physical Exam:  weight is 199 lb (90.266 kg). His oral temperature is 98 F (36.7 C). His blood pressure is 138/85 and his pulse is 63. His respiration is 18.   Well-developed and well-nourished white gentleman. Head and neck exam shows no ocular or oral lesions. He does have the surgical incision on the lower eyelid and on the right eye. He has no scleral icterus. He has no adenopathy in the neck. Lungs are clear. Cardiac exam regular rate and rhythm with no murmurs, rubs or bruits. Abdomen is  soft. He has good bowel sounds. There is no fluid wave. There is no palpable liver or spleen tip. Extremities shows no clubbing, cyanosis or edema. No palpable venous cord is noted. Skin exam no rashes, ecchymosis or petechia. Neurological exam shows no focal neurological deficits.  Lab Results  Component Value Date   WBC 10.3* 11/12/2014   HGB 14.8 11/12/2014   HCT 46.3 11/12/2014   MCV 77* 11/12/2014   PLT 367 11/12/2014     Chemistry      Component Value Date/Time   NA 139 09/09/2014 1408   NA 143 05/12/2014 1412   K 4.5 09/09/2014 1408   K 3.7 05/12/2014 1412   CL 103 09/09/2014 1408   CL 105 05/12/2014 1412   CO2 22 09/09/2014 1408   CO2 29 05/12/2014 1412   BUN 12 09/09/2014 1408   BUN  10 05/12/2014 1412   CREATININE 0.98 09/09/2014 1408   CREATININE 0.8 05/12/2014 1412      Component Value Date/Time   CALCIUM 9.1 09/09/2014 1408   CALCIUM 9.7 05/12/2014 1412   ALKPHOS 53 09/09/2014 1408   ALKPHOS 58 05/12/2014 1412   AST 27 09/09/2014 1408   AST 42* 05/12/2014 1412   ALT 23 09/09/2014 1408   ALT 53* 05/12/2014 1412   BILITOT 0.9 09/09/2014 1408   BILITOT 1.00 05/12/2014 1412         Impression and Plan: [name] [name] is 59 year old gentleman with polycythemia vera.  We will go ahead and phlebotomize him today. It has been a couple months since he had a phlebotomy.  Otherwise, I do not see need to make any changes with his Hydrea.  He still is on baby aspirin.  I think we've probably get him back now in 2 months.    I spent about 30 minutes with him.   [name], MD 7/8/20163:35 PM

## 2015-01-13 ENCOUNTER — Other Ambulatory Visit (HOSPITAL_BASED_OUTPATIENT_CLINIC_OR_DEPARTMENT_OTHER): Payer: Managed Care, Other (non HMO)

## 2015-01-13 ENCOUNTER — Ambulatory Visit: Payer: Managed Care, Other (non HMO)

## 2015-01-13 ENCOUNTER — Encounter: Payer: Self-pay | Admitting: Hematology & Oncology

## 2015-01-13 ENCOUNTER — Ambulatory Visit (HOSPITAL_BASED_OUTPATIENT_CLINIC_OR_DEPARTMENT_OTHER): Payer: Managed Care, Other (non HMO) | Admitting: Hematology & Oncology

## 2015-01-13 VITALS — BP 121/71 | HR 62 | Temp 98.5°F | Resp 16 | Ht 72.0 in | Wt 203.0 lb

## 2015-01-13 DIAGNOSIS — D45 Polycythemia vera: Secondary | ICD-10-CM | POA: Diagnosis not present

## 2015-01-13 DIAGNOSIS — D751 Secondary polycythemia: Secondary | ICD-10-CM

## 2015-01-13 LAB — COMPREHENSIVE METABOLIC PANEL
ALT: 23 U/L (ref 9–46)
AST: 22 U/L (ref 10–35)
Albumin: 4.6 g/dL (ref 3.6–5.1)
Alkaline Phosphatase: 52 U/L (ref 40–115)
BUN: 12 mg/dL (ref 7–25)
CO2: 27 mmol/L (ref 20–31)
Calcium: 8.9 mg/dL (ref 8.6–10.3)
Chloride: 103 mmol/L (ref 98–110)
Creatinine, Ser: 0.86 mg/dL (ref 0.70–1.33)
Glucose, Bld: 103 mg/dL — ABNORMAL HIGH (ref 65–99)
Potassium: 4.2 mmol/L (ref 3.5–5.3)
Sodium: 138 mmol/L (ref 135–146)
Total Bilirubin: 0.7 mg/dL (ref 0.2–1.2)
Total Protein: 6.4 g/dL (ref 6.1–8.1)

## 2015-01-13 LAB — LACTATE DEHYDROGENASE: LDH: 208 U/L (ref 94–250)

## 2015-01-13 LAB — CBC WITH DIFFERENTIAL (CANCER CENTER ONLY)
BASO#: 0.2 10*3/uL (ref 0.0–0.2)
BASO%: 2.7 % — ABNORMAL HIGH (ref 0.0–2.0)
EOS%: 4.6 % (ref 0.0–7.0)
Eosinophils Absolute: 0.4 10*3/uL (ref 0.0–0.5)
HCT: 43.1 % (ref 38.7–49.9)
HGB: 13.1 g/dL (ref 13.0–17.1)
LYMPH#: 1.1 10*3/uL (ref 0.9–3.3)
LYMPH%: 13.9 % — ABNORMAL LOW (ref 14.0–48.0)
MCH: 22.7 pg — ABNORMAL LOW (ref 28.0–33.4)
MCHC: 30.4 g/dL — ABNORMAL LOW (ref 32.0–35.9)
MCV: 75 fL — ABNORMAL LOW (ref 82–98)
MONO#: 0.5 10*3/uL (ref 0.1–0.9)
MONO%: 6.2 % (ref 0.0–13.0)
NEUT#: 5.9 10*3/uL (ref 1.5–6.5)
NEUT%: 72.6 % (ref 40.0–80.0)
Platelets: 368 10*3/uL (ref 145–400)
RBC: 5.76 10*6/uL — ABNORMAL HIGH (ref 4.20–5.70)
RDW: 16.1 % — ABNORMAL HIGH (ref 11.1–15.7)
WBC: 8.1 10*3/uL (ref 4.0–10.0)

## 2015-01-13 NOTE — Progress Notes (Signed)
No phlebotomy today per dr. ennever 

## 2015-01-13 NOTE — Progress Notes (Signed)
Hematology and Oncology Follow Up Visit  [name] [name] [id number] Jul 05, 1955 59 y.o. 01/13/2015   Principle Diagnosis:  Polycythemia vera- JAK2 (+)  Current Therapy:    Phlebotomy to maintain hematocrit below 45%  Hydrea 1000 mg by mouth daily  Aspirin 81 mg by mouth daily     Interim History:  [name] is back for followup. He is doing quite well. His only complaint has been some headaches. He's had this for a couple weeks. They seem to respond to either Tylenol or Motrin. They're not related to anything he does. They're not related to eating. They're not worse at nighttime.  He was phlebotomized last time he was here.  He is working without difficulties.  He has wife are getting ready to go down to [facility] to see their daughter tomorrow.  He is under quite a bit of stress. His mother, who is 77 years old, can no longer drive so he has to pick up the "slack" for her.  He's had no cough or shortness of breath.  Overall, his performance status is ECOG 1. .  Medications:  Current outpatient prescriptions:  .  acetaminophen (TYLENOL) 500 MG tablet, Take 1,000 mg by mouth every 6 (six) hours as needed. For pain , Disp: , Rfl:  .  aspirin 81 MG tablet, Take 81 mg by mouth daily.  , Disp: , Rfl:  .  BuPROPion HCl (WELLBUTRIN PO), Take by mouth daily. TAKES 1/2  TAB DAILY, Disp: , Rfl:  .  clidinium-chlordiazePOXIDE (LIBRAX) 5-2.5 MG per capsule, TAKE 1 CAPSULE BY MOUTH AS NEEDED, Disp: 60 capsule, Rfl: 1 .  diphenoxylate-atropine (LOMOTIL) 2.5-0.025 MG per tablet, Take 1 tablet by mouth as needed for diarrhea or loose stools., Disp: , Rfl:  .  doxazosin (CARDURA) 4 MG tablet, TAKE 1/2 TABLET AT BEDTIME FOR 1 WEEK THEN TAKE 1 TABLET BY MOUTH EVERY DAY AT BEDTIME, Disp: , Rfl: 11 .  doxycycline (PERIOSTAT) 20 MG tablet, Take 20 mg by mouth 2 (two) times daily., Disp: , Rfl: 11 .  hydroxyurea (HYDREA) 500 MG capsule, TAKE 1 CAPSULES BY MOUTH EVERY DAY (Patient taking differently:  Take 500 mg by mouth 2 (two) times daily. ), Disp: 60 capsule, Rfl: 9 .  hyoscyamine (LEVBID) 0.375 MG 12 hr tablet, Take 0.375 mg by mouth as needed., Disp: , Rfl:  .  meloxicam (MOBIC) 15 MG tablet, Take 15 mg by mouth as needed., Disp: , Rfl:  .  propranolol ER (INDERAL LA) 120 MG 24 hr capsule, TAKE ONE CAPSULE BY MOUTH EVERY DAY, Disp: 30 capsule, Rfl: 5 .  psyllium (METAMUCIL) 58.6 % powder, Take 1 packet by mouth daily., Disp: , Rfl:  .  TOBRADEX ophthalmic ointment, , Disp: , Rfl: 0 .  zolpidem (AMBIEN CR) 12.5 MG CR tablet, Take by mouth as needed. For sleep pt usually takes 1/2 tablet, Disp: , Rfl:  .  [DISCONTINUED] propranolol (INNOPRAN XL) 80 MG 24 hr capsule, Take 1 capsule (80 mg total) by mouth at bedtime., Disp: 30 capsule, Rfl: 0  Allergies: No Known Allergies  Past Medical History, Surgical history, Social history, and Family History were reviewed and updated.  Review of Systems: As above  Physical Exam:  height is 6' (1.829 m) and weight is 203 lb (92.08 kg). His oral temperature is 98.5 F (36.9 C). His blood pressure is 121/71 and his pulse is 62. His respiration is 16.   Well-developed and well-nourished white gentleman. Head and neck exam shows no ocular  or oral lesions. He does have the surgical incision on the lower eyelid and on the right eye. He has no scleral icterus. He has no adenopathy in the neck. Lungs are clear. Cardiac exam regular rate and rhythm with no murmurs, rubs or bruits. Abdomen is soft. He has good bowel sounds. There is no fluid wave. There is no palpable liver or spleen tip. Extremities shows no clubbing, cyanosis or edema. No palpable venous cord is noted. Skin exam no rashes, ecchymosis or petechia. Neurological exam shows no focal neurological deficits.  Lab Results  Component Value Date   WBC 8.1 01/13/2015   HGB 13.1 01/13/2015   HCT 43.1 01/13/2015   MCV 75* 01/13/2015   PLT 368 01/13/2015     Chemistry      Component Value  Date/Time   NA 139 11/12/2014 1421   NA 143 05/12/2014 1412   K 4.2 11/12/2014 1421   K 3.7 05/12/2014 1412   CL 102 11/12/2014 1421   CL 105 05/12/2014 1412   CO2 26 11/12/2014 1421   CO2 29 05/12/2014 1412   BUN 11 11/12/2014 1421   BUN 10 05/12/2014 1412   CREATININE 0.94 11/12/2014 1421   CREATININE 0.8 05/12/2014 1412      Component Value Date/Time   CALCIUM 9.1 11/12/2014 1421   CALCIUM 9.7 05/12/2014 1412   ALKPHOS 53 11/12/2014 1421   ALKPHOS 58 05/12/2014 1412   AST 23 11/12/2014 1421   AST 42* 05/12/2014 1412   ALT 18 11/12/2014 1421   ALT 53* 05/12/2014 1412   BILITOT 0.8 11/12/2014 1421   BILITOT 1.00 05/12/2014 1412         Impression and Plan: [name] [name] is 59 year old gentleman with polycythemia vera. She does not need to be phlebotomized today.  I'll plan to get him back in about 6-8 weeks. I want to get him back before the holidays make sure that everything is doing okay and tried again through the holidays without having to be phlebotomized.  I'm not sure was having some headaches. His blood pressure looks pretty good. His hemoglobin certainly is not too high. He is taking his aspirin. He does see his family doctor in a couple weeks.     [name], MD 9/8/20163:05 PM

## 2015-01-14 LAB — IRON AND TIBC CHCC
%SAT: 7 % — ABNORMAL LOW (ref 20–55)
Iron: 32 ug/dL — ABNORMAL LOW (ref 42–163)
TIBC: 460 ug/dL — ABNORMAL HIGH (ref 202–409)
UIBC: 429 ug/dL — ABNORMAL HIGH (ref 117–376)

## 2015-01-14 LAB — FERRITIN CHCC: Ferritin: 7 ng/ml — ABNORMAL LOW (ref 22–316)

## 2015-02-24 ENCOUNTER — Encounter: Payer: Self-pay | Admitting: Hematology & Oncology

## 2015-02-24 ENCOUNTER — Ambulatory Visit (HOSPITAL_BASED_OUTPATIENT_CLINIC_OR_DEPARTMENT_OTHER): Payer: Managed Care, Other (non HMO)

## 2015-02-24 ENCOUNTER — Ambulatory Visit (HOSPITAL_BASED_OUTPATIENT_CLINIC_OR_DEPARTMENT_OTHER): Payer: Managed Care, Other (non HMO) | Admitting: Hematology & Oncology

## 2015-02-24 ENCOUNTER — Other Ambulatory Visit (HOSPITAL_BASED_OUTPATIENT_CLINIC_OR_DEPARTMENT_OTHER): Payer: Managed Care, Other (non HMO)

## 2015-02-24 VITALS — BP 119/75 | HR 63 | Resp 18

## 2015-02-24 VITALS — BP 130/77 | HR 60 | Temp 98.4°F | Resp 18

## 2015-02-24 DIAGNOSIS — D45 Polycythemia vera: Secondary | ICD-10-CM | POA: Diagnosis not present

## 2015-02-24 DIAGNOSIS — D751 Secondary polycythemia: Secondary | ICD-10-CM

## 2015-02-24 LAB — COMPREHENSIVE METABOLIC PANEL
ALT: 20 U/L (ref 9–46)
AST: 22 U/L (ref 10–35)
Albumin: 4.6 g/dL (ref 3.6–5.1)
Alkaline Phosphatase: 52 U/L (ref 40–115)
BUN: 17 mg/dL (ref 7–25)
CO2: 26 mmol/L (ref 20–31)
Calcium: 9.1 mg/dL (ref 8.6–10.3)
Chloride: 104 mmol/L (ref 98–110)
Creatinine, Ser: 0.89 mg/dL (ref 0.70–1.33)
Glucose, Bld: 83 mg/dL (ref 65–99)
Potassium: 4.5 mmol/L (ref 3.5–5.3)
Sodium: 140 mmol/L (ref 135–146)
Total Bilirubin: 0.7 mg/dL (ref 0.2–1.2)
Total Protein: 6.5 g/dL (ref 6.1–8.1)

## 2015-02-24 LAB — CBC WITH DIFFERENTIAL (CANCER CENTER ONLY)
BASO#: 0.3 10*3/uL — ABNORMAL HIGH (ref 0.0–0.2)
BASO%: 2.8 % — ABNORMAL HIGH (ref 0.0–2.0)
EOS%: 5 % (ref 0.0–7.0)
Eosinophils Absolute: 0.4 10*3/uL (ref 0.0–0.5)
HCT: 45.6 % (ref 38.7–49.9)
HGB: 14.1 g/dL (ref 13.0–17.1)
LYMPH#: 1.1 10*3/uL (ref 0.9–3.3)
LYMPH%: 12.2 % — ABNORMAL LOW (ref 14.0–48.0)
MCH: 23 pg — ABNORMAL LOW (ref 28.0–33.4)
MCHC: 30.9 g/dL — ABNORMAL LOW (ref 32.0–35.9)
MCV: 74 fL — ABNORMAL LOW (ref 82–98)
MONO#: 0.7 10*3/uL (ref 0.1–0.9)
MONO%: 7.9 % (ref 0.0–13.0)
NEUT#: 6.4 10*3/uL (ref 1.5–6.5)
NEUT%: 72.1 % (ref 40.0–80.0)
Platelets: 596 10*3/uL — ABNORMAL HIGH (ref 145–400)
RBC: 6.14 10*6/uL — ABNORMAL HIGH (ref 4.20–5.70)
RDW: 18.8 % — ABNORMAL HIGH (ref 11.1–15.7)
WBC: 8.8 10*3/uL (ref 4.0–10.0)

## 2015-02-24 LAB — LACTATE DEHYDROGENASE: LDH: 274 U/L — ABNORMAL HIGH (ref 94–250)

## 2015-02-24 NOTE — Progress Notes (Signed)
Hematology and Oncology Follow Up Visit  [name] [name] [id number] [birth year] 59 y.o. 02/24/2015   Principle Diagnosis:  Polycythemia vera- JAK2 (+)  Current Therapy:    Phlebotomy to maintain hematocrit below 45%  Hydrea 1000 mg by mouth daily  Aspirin 81 mg by mouth daily     Interim History:  [name] is back for followup. He is doing okay. He is quite busy at work. This is the [employer] . With this, he does stay busy.  He apparently has been only taking the Hydrea at 500 mg a day.  He's had no problem with headaches. There's been no abdominal pain. He's had no nausea or vomiting. He's had no cough. He's had no rashes.  His iron studies back in early September showed a ferritin of 7 with an iron saturation of 7%. His LDH in September was 208.  He has not had any issues with fever. He has had no problems with cough or shortness of breath.  His performance status is ECOG 0.   Medications:  Current outpatient prescriptions:  .  acetaminophen (TYLENOL) 500 MG tablet, Take 1,000 mg by mouth every 6 (six) hours as needed. For pain , Disp: , Rfl:  .  aspirin 81 MG tablet, Take 81 mg by mouth daily.  , Disp: , Rfl:  .  buPROPion (WELLBUTRIN SR) 150 MG 12 hr tablet, Take 150 mg by mouth every morning., Disp: , Rfl: 12 .  BuPROPion HCl (WELLBUTRIN PO), Take by mouth daily. TAKES 1/2  TAB DAILY, Disp: , Rfl:  .  clidinium-chlordiazePOXIDE (LIBRAX) 5-2.5 MG per capsule, TAKE 1 CAPSULE BY MOUTH AS NEEDED, Disp: 60 capsule, Rfl: 1 .  diphenoxylate-atropine (LOMOTIL) 2.5-0.025 MG per tablet, Take 1 tablet by mouth as needed for diarrhea or loose stools., Disp: , Rfl:  .  doxazosin (CARDURA) 4 MG tablet, TAKE 1/2 TABLET AT BEDTIME FOR 1 WEEK THEN TAKE 1 TABLET BY MOUTH EVERY DAY AT BEDTIME, Disp: , Rfl: 11 .  doxycycline (PERIOSTAT) 20 MG tablet, Take 20 mg by mouth 2 (two) times daily., Disp: , Rfl: 11 .  hydroxyurea (HYDREA) 500 MG capsule, TAKE 1 CAPSULES BY MOUTH EVERY  DAY (Patient taking differently: Take 500 mg by mouth 2 (two) times daily. ), Disp: 60 capsule, Rfl: 9 .  hyoscyamine (LEVBID) 0.375 MG 12 hr tablet, Take 0.375 mg by mouth as needed., Disp: , Rfl:  .  meloxicam (MOBIC) 15 MG tablet, Take 15 mg by mouth as needed., Disp: , Rfl:  .  propranolol ER (INDERAL LA) 120 MG 24 hr capsule, TAKE ONE CAPSULE BY MOUTH EVERY DAY, Disp: 30 capsule, Rfl: 5 .  psyllium (METAMUCIL) 58.6 % powder, Take 1 packet by mouth daily., Disp: , Rfl:  .  TOBRADEX ophthalmic ointment, , Disp: , Rfl: 0 .  zolpidem (AMBIEN CR) 12.5 MG CR tablet, Take by mouth as needed. For sleep pt usually takes 1/2 tablet, Disp: , Rfl:  .  [DISCONTINUED] propranolol (INNOPRAN XL) 80 MG 24 hr capsule, Take 1 capsule (80 mg total) by mouth at bedtime., Disp: 30 capsule, Rfl: 0  Allergies: No Known Allergies  Past Medical History, Surgical history, Social history, and Family History were reviewed and updated.  Review of Systems: As above  Physical Exam:  oral temperature is 98.4 F (36.9 C). His blood pressure is 130/77 and his pulse is 60. His respiration is 18.   Well-developed and well-nourished white gentleman. Head and neck exam shows no ocular or oral  lesions. He does have the surgical incision on the lower eyelid and on the right eye. He has no scleral icterus. He has no adenopathy in the neck. Lungs are clear. Cardiac exam regular rate and rhythm with no murmurs, rubs or bruits. Abdomen is soft. He has good bowel sounds. There is no fluid wave. There is no palpable liver or spleen tip. Extremities shows no clubbing, cyanosis or edema. No palpable venous cord is noted. Skin exam no rashes, ecchymosis or petechia. Neurological exam shows no focal neurological deficits.  Lab Results  Component Value Date   WBC 8.8 02/24/2015   HGB 14.1 02/24/2015   HCT 45.6 02/24/2015   MCV 74* 02/24/2015   PLT 596* 02/24/2015     Chemistry      Component Value Date/Time   NA 138 01/13/2015  1411   NA 143 05/12/2014 1412   K 4.2 01/13/2015 1411   K 3.7 05/12/2014 1412   CL 103 01/13/2015 1411   CL 105 05/12/2014 1412   CO2 27 01/13/2015 1411   CO2 29 05/12/2014 1412   BUN 12 01/13/2015 1411   BUN 10 05/12/2014 1412   CREATININE 0.86 01/13/2015 1411   CREATININE 0.8 05/12/2014 1412      Component Value Date/Time   CALCIUM 8.9 01/13/2015 1411   CALCIUM 9.7 05/12/2014 1412   ALKPHOS 52 01/13/2015 1411   ALKPHOS 58 05/12/2014 1412   AST 22 01/13/2015 1411   AST 42* 05/12/2014 1412   ALT 23 01/13/2015 1411   ALT 53* 05/12/2014 1412   BILITOT 0.7 01/13/2015 1411   BILITOT 1.00 05/12/2014 1412         Impression and Plan: [name] [name] is 58 year old gentleman with polycythemia vera. Apparently, he is only doing the Hydrea 500 mg a day. I will have to make sure that he goes up to 1000 mg daily.  We will go ahead and phlebotomize him today.  I will does blood on the microscope. I do not see anything that looked suspicious for any type of progression or transformation.  We will plan to get him back in about 6 weeks now. I want to try to coordinate his appointments so that we do not interfere with his vacation schedule.    [name], MD 10/20/20164:42 PM

## 2015-02-24 NOTE — Patient Instructions (Signed)
Therapeutic Phlebotomy, Care After  Refer to this sheet in the next few weeks. These instructions provide you with information about caring for yourself after your procedure. Your health care provider may also give you more specific instructions. Your treatment has been planned according to current medical practices, but problems sometimes occur. Call your health care provider if you have any problems or questions after your procedure.  WHAT TO EXPECT AFTER THE PROCEDURE  After your procedure, it is common to have:   Light-headedness or dizziness. You may feel faint.   Nausea.   Tiredness.  HOME CARE INSTRUCTIONS  Activities   Return to your normal activities as directed by your health care provider. Most people can go back to their normal activities right away.   Avoid strenuous physical activity and heavy lifting or pulling for about 5 hours after the procedure. Do not lift anything that is heavier than 10 lb (4.5 kg).   Athletes should avoid strenuous exercise for at least 12 hours.   Change positions slowly for the remainder of the day. This will help to prevent light-headedness or fainting.   If you feel light-headed, lie down until the feeling goes away.  Eating and Drinking   Be sure to eat well-balanced meals for the next 24 hours.   Drink enough fluid to keep your urine clear or pale yellow.   Avoid drinking alcohol on the day that you had the procedure.  Care of the Needle Insertion Site   Keep your bandage dry. You can remove the bandage after about 5 hours or as directed by your health care provider.   If you have bleeding from the needle insertion site, elevate your arm and press firmly on the site until the bleeding stops.   If you have bruising at the site, apply ice to the area:   Put ice in a plastic bag.   Place a towel between your skin and the bag.   Leave the ice on for 20 minutes, 2-3 times a day for the first 24 hours.   If the swelling does not go away after 24 hours, apply  a warm, moist washcloth to the area for 20 minutes, 2-3 times a day.  General Instructions   Avoid smoking for at least 30 minutes after the procedure.   Keep all follow-up visits as directed by your health care provider. It is important to continue with further therapeutic phlebotomy treatments as directed.  SEEK MEDICAL CARE IF:   You have redness, swelling, or pain at the needle insertion site.   You have fluid, blood, or pus coming from the needle insertion site.   You feel light-headed, dizzy, or nauseated, and the feeling does not go away.   You notice new bruising at the needle insertion site.   You feel weaker than normal.   You have a fever or chills.  SEEK IMMEDIATE MEDICAL CARE IF:   You have severe nausea or vomiting.   You have chest pain.   You have trouble breathing.    This information is not intended to replace advice given to you by your health care provider. Make sure you discuss any questions you have with your health care provider.    Document Released: 09/25/2010 Document Revised: 09/07/2014 Document Reviewed: 04/19/2014  Elsevier Interactive Patient Education 2016 [employer]

## 2015-02-24 NOTE — Progress Notes (Signed)
[name] [name] presents today for phlebotomy per MD orders. Phlebotomy procedure started at 1605 and ended at 1620. 500 ml removed. Patient observed for 30 minutes after procedure without any incident. Patient tolerated procedure well. IV needle removed intact.

## 2015-02-25 LAB — FERRITIN CHCC: Ferritin: 9 ng/ml — ABNORMAL LOW (ref 22–316)

## 2015-02-25 LAB — IRON AND TIBC CHCC
%SAT: 7 % — ABNORMAL LOW (ref 20–55)
Iron: 31 ug/dL — ABNORMAL LOW (ref 42–163)
TIBC: 445 ug/dL — ABNORMAL HIGH (ref 202–409)
UIBC: 414 ug/dL — ABNORMAL HIGH (ref 117–376)

## 2015-03-16 ENCOUNTER — Other Ambulatory Visit: Payer: Self-pay | Admitting: Hematology & Oncology

## 2015-03-25 ENCOUNTER — Other Ambulatory Visit: Payer: Self-pay | Admitting: Internal Medicine

## 2015-04-14 ENCOUNTER — Ambulatory Visit (HOSPITAL_BASED_OUTPATIENT_CLINIC_OR_DEPARTMENT_OTHER): Payer: Managed Care, Other (non HMO) | Admitting: Hematology & Oncology

## 2015-04-14 ENCOUNTER — Ambulatory Visit (HOSPITAL_BASED_OUTPATIENT_CLINIC_OR_DEPARTMENT_OTHER): Payer: Managed Care, Other (non HMO)

## 2015-04-14 ENCOUNTER — Encounter: Payer: Self-pay | Admitting: Hematology & Oncology

## 2015-04-14 ENCOUNTER — Other Ambulatory Visit (HOSPITAL_BASED_OUTPATIENT_CLINIC_OR_DEPARTMENT_OTHER): Payer: Managed Care, Other (non HMO)

## 2015-04-14 VITALS — BP 133/77 | HR 71 | Temp 98.2°F | Resp 16 | Ht 72.0 in | Wt 204.0 lb

## 2015-04-14 VITALS — BP 118/79 | HR 61

## 2015-04-14 DIAGNOSIS — K5901 Slow transit constipation: Secondary | ICD-10-CM

## 2015-04-14 DIAGNOSIS — D45 Polycythemia vera: Secondary | ICD-10-CM

## 2015-04-14 DIAGNOSIS — D751 Secondary polycythemia: Secondary | ICD-10-CM

## 2015-04-14 DIAGNOSIS — R292 Abnormal reflex: Secondary | ICD-10-CM | POA: Diagnosis not present

## 2015-04-14 LAB — CBC WITH DIFFERENTIAL (CANCER CENTER ONLY)
BASO#: 0.3 10*3/uL — ABNORMAL HIGH (ref 0.0–0.2)
BASO%: 2.7 % — ABNORMAL HIGH (ref 0.0–2.0)
EOS%: 4.6 % (ref 0.0–7.0)
Eosinophils Absolute: 0.5 10*3/uL (ref 0.0–0.5)
HCT: 44.4 % (ref 38.7–49.9)
HGB: 13.8 g/dL (ref 13.0–17.1)
LYMPH#: 1.1 10*3/uL (ref 0.9–3.3)
LYMPH%: 10.8 % — ABNORMAL LOW (ref 14.0–48.0)
MCH: 22.5 pg — ABNORMAL LOW (ref 28.0–33.4)
MCHC: 31.1 g/dL — ABNORMAL LOW (ref 32.0–35.9)
MCV: 72 fL — ABNORMAL LOW (ref 82–98)
MONO#: 0.7 10*3/uL (ref 0.1–0.9)
MONO%: 7 % (ref 0.0–13.0)
NEUT#: 7.4 10*3/uL — ABNORMAL HIGH (ref 1.5–6.5)
NEUT%: 74.9 % (ref 40.0–80.0)
Platelets: 545 10*3/uL — ABNORMAL HIGH (ref 145–400)
RBC: 6.13 10*6/uL — ABNORMAL HIGH (ref 4.20–5.70)
RDW: 18.3 % — ABNORMAL HIGH (ref 11.1–15.7)
WBC: 9.9 10*3/uL (ref 4.0–10.0)

## 2015-04-14 LAB — COMPREHENSIVE METABOLIC PANEL (CC13)
ALT: 25 U/L (ref 9–46)
AST: 26 U/L (ref 10–35)
Albumin: 4.3 g/dL (ref 3.6–5.1)
Alkaline Phosphatase: 50 U/L (ref 40–115)
BUN: 12 mg/dL (ref 7–25)
CO2: 31 mmol/L (ref 20–31)
Calcium: 9.6 mg/dL (ref 8.6–10.3)
Chloride: 102 mmol/L (ref 98–110)
Creatinine, Ser: 0.96 mg/dL (ref 0.70–1.33)
Glucose, Bld: 76 mg/dL (ref 65–99)
Potassium: 4.1 mmol/L (ref 3.5–5.3)
Sodium: 140 mmol/L (ref 135–146)
Total Bilirubin: 0.7 mg/dL (ref 0.2–1.2)
Total Protein: 6.5 g/dL (ref 6.1–8.1)

## 2015-04-14 MED ORDER — HYOSCYAMINE SULFATE ER 0.375 MG PO TB12: 0.3750 mg | ORAL_TABLET | ORAL | Status: DC | PRN

## 2015-04-14 MED ORDER — CILIDINIUM-CHLORDIAZEPOXIDE 2.5-5 MG PO CAPS: ORAL_CAPSULE | ORAL | Status: DC

## 2015-04-14 NOTE — Patient Instructions (Signed)
Therapeutic Phlebotomy, Care After  Refer to this sheet in the next few weeks. These instructions provide you with information about caring for yourself after your procedure. Your health care provider may also give you more specific instructions. Your treatment has been planned according to current medical practices, but problems sometimes occur. Call your health care provider if you have any problems or questions after your procedure.  WHAT TO EXPECT AFTER THE PROCEDURE  After your procedure, it is common to have:   Light-headedness or dizziness. You may feel faint.   Nausea.   Tiredness.  HOME CARE INSTRUCTIONS  Activities   Return to your normal activities as directed by your health care provider. Most people can go back to their normal activities right away.   Avoid strenuous physical activity and heavy lifting or pulling for about 5 hours after the procedure. Do not lift anything that is heavier than 10 lb (4.5 kg).   Athletes should avoid strenuous exercise for at least 12 hours.   Change positions slowly for the remainder of the day. This will help to prevent light-headedness or fainting.   If you feel light-headed, lie down until the feeling goes away.  Eating and Drinking   Be sure to eat well-balanced meals for the next 24 hours.   Drink enough fluid to keep your urine clear or pale yellow.   Avoid drinking alcohol on the day that you had the procedure.  Care of the Needle Insertion Site   Keep your bandage dry. You can remove the bandage after about 5 hours or as directed by your health care provider.   If you have bleeding from the needle insertion site, elevate your arm and press firmly on the site until the bleeding stops.   If you have bruising at the site, apply ice to the area:   Put ice in a plastic bag.   Place a towel between your skin and the bag.   Leave the ice on for 20 minutes, 2-3 times a day for the first 24 hours.   If the swelling does not go away after 24 hours, apply  a warm, moist washcloth to the area for 20 minutes, 2-3 times a day.  General Instructions   Avoid smoking for at least 30 minutes after the procedure.   Keep all follow-up visits as directed by your health care provider. It is important to continue with further therapeutic phlebotomy treatments as directed.  SEEK MEDICAL CARE IF:   You have redness, swelling, or pain at the needle insertion site.   You have fluid, blood, or pus coming from the needle insertion site.   You feel light-headed, dizzy, or nauseated, and the feeling does not go away.   You notice new bruising at the needle insertion site.   You feel weaker than normal.   You have a fever or chills.  SEEK IMMEDIATE MEDICAL CARE IF:   You have severe nausea or vomiting.   You have chest pain.   You have trouble breathing.    This information is not intended to replace advice given to you by your health care provider. Make sure you discuss any questions you have with your health care provider.    Document Released: 09/25/2010 Document Revised: 09/07/2014 Document Reviewed: 04/19/2014  Elsevier Interactive Patient Education 2016 [employer]

## 2015-04-14 NOTE — Progress Notes (Signed)
Abbe Amsterdam presents today for phlebotomy per MD orders. Phlebotomy procedure started at 1615 and ended at 1635. 500 ml  removed. Patient observed for 30 minutes after procedure without any incident. Patient tolerated procedure well. IV needle removed intact.

## 2015-04-14 NOTE — Progress Notes (Signed)
Hematology and Oncology Follow Up Visit  Rodrick Priestly [id number] 09-20-1955 59 y.o. 04/14/2015   Principle Diagnosis:  Polycythemia vera- JAK2 (+)  Current Therapy:    Phlebotomy to maintain hematocrit below 45%  Hydrea 1000 mg by mouth daily  Aspirin 81 mg by mouth daily     Interim History:  Mr.  Sagal is back for followup. He had a pretty busy Thanksgiving.   Hopefully, he will be staying home for Christmas.    his main complaint has been this reflux-type symptom. This seems to happen after he eats. It doesn't happen all the time but most of the time. He has no vomiting. There's been no diarrhea.   He also has noticed some discomfort in the left upper chest wall. This seems to be localized.   He's had no fever.  He's had no change in his medications. He sometimes misses his Hydrea.   He has seen Dr. Scarlette Shorts of gastroenterology.    he is still working. He is not having any problems with work.    there has not been any rashes. He's had no leg swelling. He's had no change in bowel or bladder habits.  His performance status is ECOG 0.   Medications:  Current outpatient prescriptions:  .  acetaminophen (TYLENOL) 500 MG tablet, Take 1,000 mg by mouth every 6 (six) hours as needed. For pain , Disp: , Rfl:  .  aspirin 81 MG tablet, Take 81 mg by mouth daily.  , Disp: , Rfl:  .  BuPROPion HCl (WELLBUTRIN PO), Take by mouth daily. TAKES 1/2  TAB DAILY, Disp: , Rfl:  .  clidinium-chlordiazePOXIDE (LIBRAX) 5-2.5 MG capsule, TAKE 1 CAPSULE BY MOUTH AS NEEDED every 12 hrs, Disp: 60 capsule, Rfl: 2 .  diphenoxylate-atropine (LOMOTIL) 2.5-0.025 MG per tablet, Take 1 tablet by mouth as needed for diarrhea or loose stools., Disp: , Rfl:  .  doxazosin (CARDURA) 4 MG tablet, TAKE 1/2 TABLET AT BEDTIME FOR 1 WEEK THEN TAKE 1 TABLET BY MOUTH EVERY DAY AT BEDTIME, Disp: , Rfl: 11 .  doxycycline (PERIOSTAT) 20 MG tablet, Take 20 mg by mouth 2 (two) times daily., Disp: , Rfl: 11 .   hydroxyurea (HYDREA) 500 MG capsule, TAKE 1 CAPSULES BY MOUTH EVERY DAY, Disp: 60 capsule, Rfl: 6 .  hyoscyamine (LEVBID) 0.375 MG 12 hr tablet, Take 1 tablet (0.375 mg total) by mouth as needed., Disp: 60 tablet, Rfl: 4 .  propranolol ER (INDERAL LA) 120 MG 24 hr capsule, TAKE ONE CAPSULE BY MOUTH EVERY DAY, Disp: 30 capsule, Rfl: 5 .  psyllium (METAMUCIL) 58.6 % powder, Take 1 packet by mouth daily., Disp: , Rfl:  .  TOBRADEX ophthalmic ointment, , Disp: , Rfl: 0 .  zolpidem (AMBIEN CR) 12.5 MG CR tablet, Take by mouth as needed. For sleep pt usually takes 1/2 tablet, Disp: , Rfl:  .  [DISCONTINUED] propranolol (INNOPRAN XL) 80 MG 24 hr capsule, Take 1 capsule (80 mg total) by mouth at bedtime., Disp: 30 capsule, Rfl: 0  Allergies: No Known Allergies  Past Medical History, Surgical history, Social history, and Family History were reviewed and updated.  Review of Systems: As above  Physical Exam:  height is 6' (1.829 m) and weight is 204 lb (92.534 kg). His oral temperature is 98.2 F (36.8 C). His blood pressure is 133/77 and his pulse is 71. His respiration is 16.   Well-developed and well-nourished white gentleman. Head and neck exam shows no ocular or oral lesions.  He does have the surgical incision on the lower eyelid and on the right eye. He has no scleral icterus. He has no adenopathy in the neck. Lungs are clear. Cardiac exam regular rate and rhythm with no murmurs, rubs or bruits. Abdomen is soft. He has good bowel sounds. There is no fluid wave. There is no palpable liver or spleen tip. Extremities shows no clubbing, cyanosis or edema. No palpable venous cord is noted. Skin exam no rashes, ecchymosis or petechia. Neurological exam shows no focal neurological deficits.  Lab Results  Component Value Date   WBC 9.9 04/14/2015   HGB 13.8 04/14/2015   HCT 44.4 04/14/2015   MCV 72* 04/14/2015   PLT 545* 04/14/2015     Chemistry      Component Value Date/Time   NA 140 02/24/2015  1509   NA 143 05/12/2014 1412   K 4.5 02/24/2015 1509   K 3.7 05/12/2014 1412   CL 104 02/24/2015 1509   CL 105 05/12/2014 1412   CO2 26 02/24/2015 1509   CO2 29 05/12/2014 1412   BUN 17 02/24/2015 1509   BUN 10 05/12/2014 1412   CREATININE 0.89 02/24/2015 1509   CREATININE 0.8 05/12/2014 1412      Component Value Date/Time   CALCIUM 9.1 02/24/2015 1509   CALCIUM 9.7 05/12/2014 1412   ALKPHOS 52 02/24/2015 1509   ALKPHOS 58 05/12/2014 1412   AST 22 02/24/2015 1509   AST 42* 05/12/2014 1412   ALT 20 02/24/2015 1509   ALT 53* 05/12/2014 1412   BILITOT 0.7 02/24/2015 1509   BILITOT 1.00 05/12/2014 1412         Impression and Plan: Mr. Worku is 59 year old gentleman with polycythemia vera.  his main complaint now is that he has his reflux-type symptom. I'm not sure what is going on with this  So I think that an upper GI probably would not be a bad idea.   I will go ahead and refill his Levbid and  Librax.   I don't know if he has some kind of hiatal hernia.   We will go ahead and phlebotomize him today. I think that he is close enough to 45% that we could phlebotomize him area   We will plan to get him back to see [place] in another 2 months. If he does have a gastroenterologist , Dr. Scarlette Shorts , who he can see if there are any GI problems.  [name], MD 12/8/20164:00 PM

## 2015-04-15 ENCOUNTER — Telehealth: Payer: Self-pay | Admitting: Hematology & Oncology

## 2015-04-15 LAB — LACTATE DEHYDROGENASE: LDH: 344 U/L — ABNORMAL HIGH (ref 125–245)

## 2015-04-15 LAB — IRON AND TIBC
%SAT: 6 % — ABNORMAL LOW (ref 20–55)
Iron: 28 ug/dL — ABNORMAL LOW (ref 42–163)
TIBC: 456 ug/dL — ABNORMAL HIGH (ref 202–409)
UIBC: 429 ug/dL — ABNORMAL HIGH (ref 117–376)

## 2015-04-15 LAB — FERRITIN: Ferritin: 5 ng/ml — ABNORMAL LOW (ref 22–316)

## 2015-04-15 NOTE — Telephone Encounter (Signed)
Called patient and l/m for patient to return my call to Dr. Vernona Rieger office.        [facility].

## 2015-04-21 ENCOUNTER — Inpatient Hospital Stay (HOSPITAL_COMMUNITY): Admission: RE | Admit: 2015-04-21 | Payer: Managed Care, Other (non HMO) | Source: Ambulatory Visit

## 2015-05-22 ENCOUNTER — Other Ambulatory Visit: Payer: Self-pay | Admitting: Hematology & Oncology

## 2015-06-15 ENCOUNTER — Ambulatory Visit: Payer: Managed Care, Other (non HMO)

## 2015-06-15 ENCOUNTER — Encounter: Payer: Self-pay | Admitting: Hematology & Oncology

## 2015-06-15 ENCOUNTER — Other Ambulatory Visit (HOSPITAL_BASED_OUTPATIENT_CLINIC_OR_DEPARTMENT_OTHER): Payer: Managed Care, Other (non HMO)

## 2015-06-15 ENCOUNTER — Ambulatory Visit (HOSPITAL_BASED_OUTPATIENT_CLINIC_OR_DEPARTMENT_OTHER): Payer: Managed Care, Other (non HMO) | Admitting: Hematology & Oncology

## 2015-06-15 VITALS — BP 117/72 | HR 66 | Temp 98.2°F | Resp 16 | Ht 72.0 in | Wt 204.0 lb

## 2015-06-15 DIAGNOSIS — D751 Secondary polycythemia: Secondary | ICD-10-CM

## 2015-06-15 DIAGNOSIS — K5901 Slow transit constipation: Secondary | ICD-10-CM

## 2015-06-15 DIAGNOSIS — D45 Polycythemia vera: Secondary | ICD-10-CM

## 2015-06-15 DIAGNOSIS — R292 Abnormal reflex: Secondary | ICD-10-CM

## 2015-06-15 LAB — CBC WITH DIFFERENTIAL (CANCER CENTER ONLY)
BASO#: 0.1 10*3/uL (ref 0.0–0.2)
BASO%: 2.4 % — ABNORMAL HIGH (ref 0.0–2.0)
EOS%: 4.1 % (ref 0.0–7.0)
Eosinophils Absolute: 0.2 10*3/uL (ref 0.0–0.5)
HCT: 43.3 % (ref 38.7–49.9)
HGB: 13.3 g/dL (ref 13.0–17.1)
LYMPH#: 0.9 10*3/uL (ref 0.9–3.3)
LYMPH%: 20.1 % (ref 14.0–48.0)
MCH: 22.4 pg — ABNORMAL LOW (ref 28.0–33.4)
MCHC: 30.7 g/dL — ABNORMAL LOW (ref 32.0–35.9)
MCV: 73 fL — ABNORMAL LOW (ref 82–98)
MONO#: 0.3 10*3/uL (ref 0.1–0.9)
MONO%: 7.1 % (ref 0.0–13.0)
NEUT#: 3.1 10*3/uL (ref 1.5–6.5)
NEUT%: 66.3 % (ref 40.0–80.0)
Platelets: 211 10*3/uL (ref 145–400)
RBC: 5.95 10*6/uL — ABNORMAL HIGH (ref 4.20–5.70)
RDW: 18.7 % — ABNORMAL HIGH (ref 11.1–15.7)
WBC: 4.6 10*3/uL (ref 4.0–10.0)

## 2015-06-15 LAB — COMPREHENSIVE METABOLIC PANEL (CC13)
ALT: 45 IU/L — ABNORMAL HIGH (ref 0–44)
AST (SGOT): 36 IU/L (ref 0–40)
Albumin, Serum: 4.8 g/dL (ref 3.5–5.5)
Albumin/Globulin Ratio: 2.7 — ABNORMAL HIGH (ref 1.1–2.5)
Alkaline Phosphatase, S: 56 IU/L (ref 39–117)
BUN/Creatinine Ratio: 15 (ref 9–20)
BUN: 13 mg/dL (ref 6–24)
Bilirubin Total: 0.8 mg/dL (ref 0.0–1.2)
Calcium, Ser: 9.3 mg/dL (ref 8.7–10.2)
Carbon Dioxide, Total: 28 mmol/L (ref 18–29)
Chloride, Ser: 102 mmol/L (ref 96–106)
Creatinine, Ser: 0.86 mg/dL (ref 0.76–1.27)
GFR calc Af Amer: 111 mL/min/{1.73_m2} (ref >59)
GFR calc non Af Amer: 96 mL/min/{1.73_m2} (ref >59)
Globulin, Total: 1.8 g/dL (ref 1.5–4.5)
Glucose: 88 mg/dL (ref 65–99)
Potassium, Ser: 4 mmol/L (ref 3.5–5.2)
Sodium: 138 mmol/L (ref 134–144)
Total Protein: 6.6 g/dL (ref 6.0–8.5)

## 2015-06-15 NOTE — Progress Notes (Signed)
No Phlebotomy today per DR. Ennever order.

## 2015-06-15 NOTE — Progress Notes (Signed)
Hematology and Oncology Follow Up Visit  [name] [name] [id number] 06/07/1955 60 y.o. 06/15/2015   Principle Diagnosis:  Polycythemia vera- JAK2 (+)  Current Therapy:    Phlebotomy to maintain hematocrit below 45%  Hydrea 1000 mg by mouth daily alternate with 500 mg by mouth daily  Aspirin 81 mg by mouth daily     Interim History:  Mr.  [name] [name] is back for followup. Several, he's been doing pretty well. He was busy over the holidays.  We increased his Hydrea to 1000 mg a day. He is tolerating this pretty well.  He's had no problems with bleeding or bruising. He's had no change in bowel or bladder habits. He's had no cough. He's had no rashes. He's had no leg swelling. He's had no white problems.  He is still working. He's having no problem with fatigue or weakness.  There's been no fever. He did have a cold in December. He said took about 3 weeks from it over this period he was on antibiotics.  His performance status is ECOG 0.   Medications:  Current outpatient prescriptions:  .  acetaminophen (TYLENOL) 500 MG tablet, Take 1,000 mg by mouth every 6 (six) hours as needed. For pain , Disp: , Rfl:  .  aspirin 81 MG tablet, Take 81 mg by mouth daily.  , Disp: , Rfl:  .  BuPROPion HCl (WELLBUTRIN PO), Take by mouth daily. TAKES 1/2  TAB DAILY, Disp: , Rfl:  .  clidinium-chlordiazePOXIDE (LIBRAX) 5-2.5 MG capsule, TAKE 1 CAPSULE BY MOUTH AS NEEDED every 12 hrs, Disp: 60 capsule, Rfl: 2 .  diphenoxylate-atropine (LOMOTIL) 2.5-0.025 MG per tablet, Take 1 tablet by mouth as needed for diarrhea or loose stools., Disp: , Rfl:  .  doxazosin (CARDURA) 4 MG tablet, TAKE 1/2 TABLET AT BEDTIME FOR 1 WEEK THEN TAKE 1 TABLET BY MOUTH EVERY DAY AT BEDTIME, Disp: , Rfl: 11 .  doxycycline (PERIOSTAT) 20 MG tablet, Take 20 mg by mouth 2 (two) times daily., Disp: , Rfl: 11 .  hydroxyurea (HYDREA) 500 MG capsule, TAKE 1 CAPSULES BY MOUTH EVERY DAY, Disp: 60 capsule, Rfl: 6 .  hyoscyamine (LEVBID)  0.375 MG 12 hr tablet, Take 1 tablet (0.375 mg total) by mouth as needed., Disp: 60 tablet, Rfl: 4 .  propranolol ER (INDERAL LA) 120 MG 24 hr capsule, TAKE ONE CAPSULE BY MOUTH EVERY DAY, Disp: 30 capsule, Rfl: 5 .  psyllium (METAMUCIL) 58.6 % powder, Take 1 packet by mouth daily., Disp: , Rfl:  .  TOBRADEX ophthalmic ointment, , Disp: , Rfl: 0 .  zolpidem (AMBIEN CR) 12.5 MG CR tablet, Take by mouth as needed. For sleep pt usually takes 1/2 tablet, Disp: , Rfl:  .  [DISCONTINUED] propranolol (INNOPRAN XL) 80 MG 24 hr capsule, Take 1 capsule (80 mg total) by mouth at bedtime., Disp: 30 capsule, Rfl: 0  Allergies: No Known Allergies  Past Medical History, Surgical history, Social history, and Family History were reviewed and updated.  Review of Systems: As above  Physical Exam:  height is 6' (1.829 m) and weight is 204 lb (92.534 kg). His oral temperature is 98.2 F (36.8 C). His blood pressure is 117/72 and his pulse is 66. His respiration is 16.   Well-developed and well-nourished white gentleman. Head and neck exam shows no ocular or oral lesions. He does have the surgical incision on the lower eyelid and on the right eye. He has no scleral icterus. He has no adenopathy in the neck. Lungs  are clear. Cardiac exam regular rate and rhythm with no murmurs, rubs or bruits. Abdomen is soft. He has good bowel sounds. There is no fluid wave. There is no palpable liver or spleen tip. Extremities shows no clubbing, cyanosis or edema. No palpable venous cord is noted. Skin exam no rashes, ecchymosis or petechia. Neurological exam shows no focal neurological deficits.  Lab Results  Component Value Date   WBC 4.6 06/15/2015   HGB 13.3 06/15/2015   HCT 43.3 06/15/2015   MCV 73* 06/15/2015   PLT 211 06/15/2015     Chemistry      Component Value Date/Time   NA 140 04/14/2015 1520   NA 143 05/12/2014 1412   K 4.1 04/14/2015 1520   K 3.7 05/12/2014 1412   CL 102 04/14/2015 1520   CL 105  05/12/2014 1412   CO2 31 04/14/2015 1520   CO2 29 05/12/2014 1412   BUN 12 04/14/2015 1520   BUN 10 05/12/2014 1412   CREATININE 0.96 04/14/2015 1520   CREATININE 0.8 05/12/2014 1412      Component Value Date/Time   CALCIUM 9.6 04/14/2015 1520   CALCIUM 9.7 05/12/2014 1412   ALKPHOS 50 04/14/2015 1520   ALKPHOS 58 05/12/2014 1412   AST 26 04/14/2015 1520   AST 42* 05/12/2014 1412   ALT 25 04/14/2015 1520   ALT 53* 05/12/2014 1412   BILITOT 0.7 04/14/2015 1520   BILITOT 1.00 05/12/2014 1412         Impression and Plan: [name] is 60 year old gentleman with polycythemia vera.  I am glad that his blood counts look good. He does not need to be phlebotomized.  We will decrease his Hydrea to 1000 mg every other day alternating with 500 mg every other day.   I will plan to get him back in 2 more months. I think this would be a reasonable frame for follow-up.     [name], MD 2/8/20174:29 PM

## 2015-06-16 LAB — LACTATE DEHYDROGENASE: LDH: 242 U/L (ref 125–245)

## 2015-08-18 ENCOUNTER — Other Ambulatory Visit (HOSPITAL_BASED_OUTPATIENT_CLINIC_OR_DEPARTMENT_OTHER): Payer: Managed Care, Other (non HMO)

## 2015-08-18 ENCOUNTER — Encounter: Payer: Self-pay | Admitting: Hematology & Oncology

## 2015-08-18 ENCOUNTER — Ambulatory Visit: Payer: Managed Care, Other (non HMO)

## 2015-08-18 ENCOUNTER — Ambulatory Visit (HOSPITAL_BASED_OUTPATIENT_CLINIC_OR_DEPARTMENT_OTHER): Payer: Managed Care, Other (non HMO) | Admitting: Hematology & Oncology

## 2015-08-18 VITALS — BP 125/77 | HR 68 | Temp 98.6°F | Resp 16 | Ht 72.0 in | Wt 203.0 lb

## 2015-08-18 DIAGNOSIS — D751 Secondary polycythemia: Secondary | ICD-10-CM

## 2015-08-18 DIAGNOSIS — D45 Polycythemia vera: Secondary | ICD-10-CM | POA: Diagnosis not present

## 2015-08-18 DIAGNOSIS — R002 Palpitations: Secondary | ICD-10-CM | POA: Diagnosis not present

## 2015-08-18 LAB — COMPREHENSIVE METABOLIC PANEL (CC13)
ALT: 26 IU/L (ref 0–44)
AST (SGOT): 26 IU/L (ref 0–40)
Albumin, Serum: 4.5 g/dL (ref 3.5–5.5)
Albumin/Globulin Ratio: 2.4 — ABNORMAL HIGH (ref 1.2–2.2)
Alkaline Phosphatase, S: 52 IU/L (ref 39–117)
BUN/Creatinine Ratio: 20 (ref 9–20)
BUN: 16 mg/dL (ref 6–24)
Bilirubin Total: 0.6 mg/dL (ref 0.0–1.2)
Calcium, Ser: 9 mg/dL (ref 8.7–10.2)
Carbon Dioxide, Total: 24 mmol/L (ref 18–29)
Chloride, Ser: 102 mmol/L (ref 96–106)
Creatinine, Ser: 0.82 mg/dL (ref 0.76–1.27)
GFR calc Af Amer: 113 mL/min/{1.73_m2} (ref >59)
GFR calc non Af Amer: 98 mL/min/{1.73_m2} (ref >59)
Globulin, Total: 1.9 g/dL (ref 1.5–4.5)
Glucose: 107 mg/dL — ABNORMAL HIGH (ref 65–99)
Potassium, Ser: 3.9 mmol/L (ref 3.5–5.2)
Sodium: 135 mmol/L (ref 134–144)
Total Protein: 6.4 g/dL (ref 6.0–8.5)

## 2015-08-18 LAB — CBC WITH DIFFERENTIAL (CANCER CENTER ONLY)
BASO#: 0.2 10*3/uL (ref 0.0–0.2)
BASO%: 2.1 % — ABNORMAL HIGH (ref 0.0–2.0)
EOS%: 4.2 % (ref 0.0–7.0)
Eosinophils Absolute: 0.4 10*3/uL (ref 0.0–0.5)
HCT: 43.5 % (ref 38.7–49.9)
HGB: 13.7 g/dL (ref 13.0–17.1)
LYMPH#: 1 10*3/uL (ref 0.9–3.3)
LYMPH%: 10.2 % — ABNORMAL LOW (ref 14.0–48.0)
MCH: 24.3 pg — ABNORMAL LOW (ref 28.0–33.4)
MCHC: 31.5 g/dL — ABNORMAL LOW (ref 32.0–35.9)
MCV: 77 fL — ABNORMAL LOW (ref 82–98)
MONO#: 0.7 10*3/uL (ref 0.1–0.9)
MONO%: 7 % (ref 0.0–13.0)
NEUT#: 7.8 10*3/uL — ABNORMAL HIGH (ref 1.5–6.5)
NEUT%: 76.5 % (ref 40.0–80.0)
Platelets: 557 10*3/uL — ABNORMAL HIGH (ref 145–400)
RBC: 5.64 10*6/uL (ref 4.20–5.70)
RDW: 19.8 % — ABNORMAL HIGH (ref 11.1–15.7)
WBC: 10.2 10*3/uL — ABNORMAL HIGH (ref 4.0–10.0)

## 2015-08-18 MED ORDER — HYDROXYUREA 500 MG PO CAPS: 1000.0000 mg | ORAL_CAPSULE | Freq: Every day | ORAL | Status: DC

## 2015-08-18 NOTE — Progress Notes (Signed)
Hematology and Oncology Follow Up Visit  [name] [name] [id number] 14-May-1955 60 y.o. 08/18/2015   Principle Diagnosis:  Polycythemia vera- JAK2 (+)  Current Therapy:    Phlebotomy to maintain hematocrit below 45%  Hydrea 1000 mg by mouth daily  Aspirin 81 mg by mouth daily     Interim History:  [name] is back for followup. He looks good. He is somewhat tired. 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 This is taking up most of his weekends.  He's had some palpitations. I just wonder if the elevated platelets can sometimes lead to palpitations because of chemical factor release.   He's had no nausea or vomiting. He has had some urinary issues. He does see a urologist.   He's had no rashes. He's had no fever. He's had no cough. There is been no issues with his bowels.   He's had no leg swelling. He's had no joint problems.   His performance status is ECOG 0.   Medications:  Current outpatient prescriptions:  .  acetaminophen (TYLENOL) 500 MG tablet, Take 1,000 mg by mouth every 6 (six) hours as needed. For pain , Disp: , Rfl:  .  aspirin 81 MG tablet, Take 81 mg by mouth daily.  , Disp: , Rfl:  .  buPROPion (WELLBUTRIN SR) 150 MG 12 hr tablet, Take 150 mg by mouth every morning., Disp: , Rfl: 12 .  clidinium-chlordiazePOXIDE (LIBRAX) 5-2.5 MG capsule, TAKE 1 CAPSULE BY MOUTH AS NEEDED every 12 hrs, Disp: 60 capsule, Rfl: 2 .  doxazosin (CARDURA) 4 MG tablet, TAKE 1/2 TABLET AT BEDTIME FOR 1 WEEK THEN TAKE 1 TABLET BY MOUTH EVERY DAY AT BEDTIME, Disp: , Rfl: 11 .  doxycycline (PERIOSTAT) 20 MG tablet, Take 20 mg by mouth 2 (two) times daily., Disp: , Rfl: 11 .  hydroxyurea (HYDREA) 500 MG capsule, Take 2 capsules (1,000 mg total) by mouth daily. May take with food to minimize GI side effects., Disp: 60 capsule, Rfl: 6 .  hyoscyamine (LEVBID) 0.375 MG 12 hr tablet, Take 1 tablet (0.375 mg total) by mouth as needed., Disp: 60 tablet, Rfl: 4 .   propranolol ER (INDERAL LA) 120 MG 24 hr capsule, TAKE ONE CAPSULE BY MOUTH EVERY DAY, Disp: 30 capsule, Rfl: 5 .  psyllium (METAMUCIL) 58.6 % powder, Take 1 packet by mouth daily., Disp: , Rfl:  .  zolpidem (AMBIEN CR) 12.5 MG CR tablet, Take by mouth as needed. For sleep pt usually takes 1/2 tablet, Disp: , Rfl:  .  [DISCONTINUED] propranolol (INNOPRAN XL) 80 MG 24 hr capsule, Take 1 capsule (80 mg total) by mouth at bedtime., Disp: 30 capsule, Rfl: 0  Allergies: No Known Allergies  Past Medical History, Surgical history, Social history, and Family History were reviewed and updated.  Review of Systems: As above  Physical Exam:  height is 6' (1.829 m) and weight is 203 lb (92.08 kg). His oral temperature is 98.6 F (37 C). His blood pressure is 125/77 and his pulse is 68. His respiration is 16.   Well-developed and well-nourished white gentleman. Head and neck exam shows no ocular or oral lesions. He does have the surgical incision on the lower eyelid and on the right eye. He has no scleral icterus. He has no adenopathy in the neck. Lungs are clear. Cardiac exam regular rate and rhythm with no murmurs, rubs or bruits. Abdomen is soft. He has good bowel sounds. There is no fluid wave. There is  no palpable liver or spleen tip. Extremities shows no clubbing, cyanosis or edema. No palpable venous cord is noted. Skin exam no rashes, ecchymosis or petechia. Neurological exam shows no focal neurological deficits.  Lab Results  Component Value Date   WBC 10.2* 08/18/2015   HGB 13.7 08/18/2015   HCT 43.5 08/18/2015   MCV 77* 08/18/2015   PLT 557* 08/18/2015     Chemistry      Component Value Date/Time   NA 138 06/15/2015 1519   NA 140 04/14/2015 1520   NA 143 05/12/2014 1412   K 4.0 06/15/2015 1519   K 4.1 04/14/2015 1520   K 3.7 05/12/2014 1412   CL 102 06/15/2015 1519   CL 102 04/14/2015 1520   CL 105 05/12/2014 1412   CO2 28 06/15/2015 1519   CO2 31 04/14/2015 1520   CO2 29  05/12/2014 1412   BUN 13 06/15/2015 1519   BUN 12 04/14/2015 1520   BUN 10 05/12/2014 1412   CREATININE 0.86 06/15/2015 1519   CREATININE 0.96 04/14/2015 1520   CREATININE 0.8 05/12/2014 1412      Component Value Date/Time   CALCIUM 9.3 06/15/2015 1519   CALCIUM 9.6 04/14/2015 1520   CALCIUM 9.7 05/12/2014 1412   ALKPHOS 56 06/15/2015 1519   ALKPHOS 50 04/14/2015 1520   ALKPHOS 58 05/12/2014 1412   AST 36 06/15/2015 1519   AST 26 04/14/2015 1520   AST 42* 05/12/2014 1412   ALT 45* 06/15/2015 1519   ALT 25 04/14/2015 1520   ALT 53* 05/12/2014 1412   BILITOT 0.8 06/15/2015 1519   BILITOT 0.7 04/14/2015 1520   BILITOT 1.00 05/12/2014 1412         Impression and Plan: [name] [name] is 60 year old gentleman with polycythemia vera. We will have to increase his Hydrea to 1000 mg a day. It is clear that his bone marrow response much better to the 1000 milligram daily dose.  He does not need be phlebotomized.  We will plan to see him back in 6 weeks.  [name], MD 4/13/20174:13 PM

## 2015-08-19 LAB — IRON AND TIBC
%SAT: 6 % — ABNORMAL LOW (ref 20–55)
Iron: 26 ug/dL — ABNORMAL LOW (ref 42–163)
TIBC: 454 ug/dL — ABNORMAL HIGH (ref 202–409)
UIBC: 428 ug/dL — ABNORMAL HIGH (ref 117–376)

## 2015-08-19 LAB — FERRITIN: Ferritin: 12 ng/ml — ABNORMAL LOW (ref 22–316)

## 2015-09-22 ENCOUNTER — Other Ambulatory Visit (HOSPITAL_BASED_OUTPATIENT_CLINIC_OR_DEPARTMENT_OTHER): Payer: Managed Care, Other (non HMO)

## 2015-09-22 ENCOUNTER — Encounter: Payer: Self-pay | Admitting: Hematology & Oncology

## 2015-09-22 ENCOUNTER — Ambulatory Visit (HOSPITAL_BASED_OUTPATIENT_CLINIC_OR_DEPARTMENT_OTHER): Payer: Managed Care, Other (non HMO)

## 2015-09-22 ENCOUNTER — Ambulatory Visit (HOSPITAL_BASED_OUTPATIENT_CLINIC_OR_DEPARTMENT_OTHER): Payer: Managed Care, Other (non HMO) | Admitting: Hematology & Oncology

## 2015-09-22 VITALS — BP 124/81 | HR 70 | Resp 16

## 2015-09-22 VITALS — BP 127/72 | HR 69 | Temp 98.1°F | Resp 16 | Ht 72.0 in | Wt 204.0 lb

## 2015-09-22 DIAGNOSIS — D751 Secondary polycythemia: Secondary | ICD-10-CM | POA: Diagnosis not present

## 2015-09-22 LAB — CBC WITH DIFFERENTIAL (CANCER CENTER ONLY)
BASO#: 0.3 10*3/uL — ABNORMAL HIGH (ref 0.0–0.2)
BASO%: 3.4 % — ABNORMAL HIGH (ref 0.0–2.0)
EOS%: 3.7 % (ref 0.0–7.0)
Eosinophils Absolute: 0.3 10*3/uL (ref 0.0–0.5)
HCT: 44.6 % (ref 38.7–49.9)
HGB: 14.2 g/dL (ref 13.0–17.1)
LYMPH#: 1.1 10*3/uL (ref 0.9–3.3)
LYMPH%: 12.5 % — ABNORMAL LOW (ref 14.0–48.0)
MCH: 24.7 pg — ABNORMAL LOW (ref 28.0–33.4)
MCHC: 31.8 g/dL — ABNORMAL LOW (ref 32.0–35.9)
MCV: 78 fL — ABNORMAL LOW (ref 82–98)
MONO#: 0.5 10*3/uL (ref 0.1–0.9)
MONO%: 5.4 % (ref 0.0–13.0)
NEUT#: 6.8 10*3/uL — ABNORMAL HIGH (ref 1.5–6.5)
NEUT%: 75 % (ref 40.0–80.0)
Platelets: 369 10*3/uL (ref 145–400)
RBC: 5.75 10*6/uL — ABNORMAL HIGH (ref 4.20–5.70)
RDW: 17.7 % — ABNORMAL HIGH (ref 11.1–15.7)
WBC: 9.1 10*3/uL (ref 4.0–10.0)

## 2015-09-22 LAB — COMPREHENSIVE METABOLIC PANEL
ALT: 39 U/L (ref 0–55)
AST: 29 U/L (ref 5–34)
Albumin: 4.3 g/dL (ref 3.5–5.0)
Alkaline Phosphatase: 55 U/L (ref 40–150)
Anion Gap: 8 mEq/L (ref 3–11)
BUN: 15.6 mg/dL (ref 7.0–26.0)
CO2: 26 mEq/L (ref 22–29)
Calcium: 9.3 mg/dL (ref 8.4–10.4)
Chloride: 108 mEq/L (ref 98–109)
Creatinine: 0.9 mg/dL (ref 0.7–1.3)
EGFR: >90 mL/min/{1.73_m2} (ref >90)
Glucose: 111 mg/dl (ref 70–140)
Potassium: 3.7 mEq/L (ref 3.5–5.1)
Sodium: 142 mEq/L (ref 136–145)
Total Bilirubin: 0.84 mg/dL (ref 0.20–1.20)
Total Protein: 6.7 g/dL (ref 6.4–8.3)

## 2015-09-22 NOTE — Progress Notes (Signed)
Hematology and Oncology Follow Up Visit  [name] [name] [id number] 1955/06/26 60 y.o. 09/22/2015   Principle Diagnosis:  Polycythemia vera- JAK2 (+)  Current Therapy:    Phlebotomy to maintain hematocrit below 45%  Hydrea 1000 mg by mouth daily  Aspirin 81 mg by mouth daily     Interim History:  [name] is back for followup. He looks good. He has wife will be going down to [place] over [place] weekend. They'll be visiting in-laws. He is looking for to this. They will be driving.   He is on a higher dose of Hydrea now. This is because his platelet count was going up. His blood count seems to be doing better.   He is complaining of some numbness in his hands. His most is when he wakes up. I don't know if he is sleeping unusual or not. I don't of this might be coming from his neck. If this continues, he probably needs to go see a neurologist.   He's had no bleeding. He's had no fever. He's had no cough or shortness of breath.   He has had no nausea or vomiting.   He has had no rashes.   Overall, his performance status is ECOG 0.   Medications:  Current outpatient prescriptions:  .  acetaminophen (TYLENOL) 500 MG tablet, Take 1,000 mg by mouth every 6 (six) hours as needed. For pain , Disp: , Rfl:  .  aspirin 81 MG tablet, Take 81 mg by mouth daily.  , Disp: , Rfl:  .  buPROPion (WELLBUTRIN SR) 150 MG 12 hr tablet, Take 150 mg by mouth every morning., Disp: , Rfl: 12 .  clidinium-chlordiazePOXIDE (LIBRAX) 5-2.5 MG capsule, TAKE 1 CAPSULE BY MOUTH AS NEEDED every 12 hrs, Disp: 60 capsule, Rfl: 2 .  doxazosin (CARDURA) 4 MG tablet, TAKE 1/2 TABLET AT BEDTIME FOR 1 WEEK THEN TAKE 1 TABLET BY MOUTH EVERY DAY AT BEDTIME, Disp: , Rfl: 11 .  doxycycline (PERIOSTAT) 20 MG tablet, Take 20 mg by mouth 2 (two) times daily., Disp: , Rfl: 11 .  hydroxyurea (HYDREA) 500 MG capsule, Take 2 capsules (1,000 mg total) by mouth daily. May take with food to minimize GI side effects., Disp:  60 capsule, Rfl: 6 .  hyoscyamine (LEVBID) 0.375 MG 12 hr tablet, Take 1 tablet (0.375 mg total) by mouth as needed., Disp: 60 tablet, Rfl: 4 .  propranolol ER (INDERAL LA) 120 MG 24 hr capsule, TAKE ONE CAPSULE BY MOUTH EVERY DAY, Disp: 30 capsule, Rfl: 5 .  psyllium (METAMUCIL) 58.6 % powder, Take 1 packet by mouth daily., Disp: , Rfl:  .  zolpidem (AMBIEN CR) 12.5 MG CR tablet, Take by mouth as needed. For sleep pt usually takes 1/2 tablet, Disp: , Rfl:  .  [DISCONTINUED] propranolol (INNOPRAN XL) 80 MG 24 hr capsule, Take 1 capsule (80 mg total) by mouth at bedtime., Disp: 30 capsule, Rfl: 0  Allergies: No Known Allergies  Past Medical History, Surgical history, Social history, and Family History were reviewed and updated.  Review of Systems: As above  Physical Exam:  height is 6' (1.829 m) and weight is 204 lb (92.534 kg). His oral temperature is 98.1 F (36.7 C). His blood pressure is 127/72 and his pulse is 69. His respiration is 16.   Well-developed and well-nourished white gentleman. Head and neck exam shows no ocular or oral lesions. He does have the surgical incision on the lower eyelid and on the right eye. He has  no scleral icterus. He has no adenopathy in the neck. Lungs are clear. Cardiac exam regular rate and rhythm with no murmurs, rubs or bruits. Abdomen is soft. He has good bowel sounds. There is no fluid wave. There is no palpable liver or spleen tip. Extremities shows no clubbing, cyanosis or edema. No palpable venous cord is noted. Skin exam no rashes, ecchymosis or petechia. Neurological exam shows no focal neurological deficits.  Lab Results  Component Value Date   WBC 9.1 09/22/2015   HGB 14.2 09/22/2015   HCT 44.6 09/22/2015   MCV 78* 09/22/2015   PLT 369 09/22/2015     Chemistry      Component Value Date/Time   NA 135 08/18/2015 1457   NA 140 04/14/2015 1520   NA 143 05/12/2014 1412   K 3.9 08/18/2015 1457   K 4.1 04/14/2015 1520   K 3.7 05/12/2014 1412     CL 102 08/18/2015 1457   CL 102 04/14/2015 1520   CL 105 05/12/2014 1412   CO2 24 08/18/2015 1457   CO2 31 04/14/2015 1520   CO2 29 05/12/2014 1412   BUN 16 08/18/2015 1457   BUN 12 04/14/2015 1520   BUN 10 05/12/2014 1412   CREATININE 0.82 08/18/2015 1457   CREATININE 0.96 04/14/2015 1520   CREATININE 0.8 05/12/2014 1412      Component Value Date/Time   CALCIUM 9.0 08/18/2015 1457   CALCIUM 9.6 04/14/2015 1520   CALCIUM 9.7 05/12/2014 1412   ALKPHOS 52 08/18/2015 1457   ALKPHOS 50 04/14/2015 1520   ALKPHOS 58 05/12/2014 1412   AST 26 08/18/2015 1457   AST 26 04/14/2015 1520   AST 42* 05/12/2014 1412   ALT 26 08/18/2015 1457   ALT 25 04/14/2015 1520   ALT 53* 05/12/2014 1412   BILITOT 0.6 08/18/2015 1457   BILITOT 0.7 04/14/2015 1520   BILITOT 1.00 05/12/2014 1412         Impression and Plan: [name] [name] is 60 year old gentleman with polycythemia vera. His blood count is doing much better on the higher dose of Hydrea.  We will need to phlebotomize him today. He is close enough to 45% that a phlebotomy would help.  I will plan to see him back in 2 months. I did this would be very reasonable.  If he continues to have problems with his hands, he may need to go to a neurologist for nerve conduction studies.   [name] Napoleon, MD 5/18/20173:05 PM

## 2015-09-22 NOTE — Progress Notes (Signed)
Abbe Amsterdam presents today for phlebotomy per MD orders. Phlebotomy procedure started at 1510 and ended at 1520. 500 grams removed. Patient observed for 30 minutes after procedure without any incident. Patient tolerated procedure well. IV needle removed intact.

## 2015-09-22 NOTE — Patient Instructions (Signed)

## 2015-09-23 LAB — LACTATE DEHYDROGENASE: LDH: 276 U/L — ABNORMAL HIGH (ref 125–245)

## 2015-10-31 ENCOUNTER — Other Ambulatory Visit: Payer: Self-pay | Admitting: Hematology & Oncology

## 2015-11-24 ENCOUNTER — Ambulatory Visit (HOSPITAL_BASED_OUTPATIENT_CLINIC_OR_DEPARTMENT_OTHER): Payer: Managed Care, Other (non HMO)

## 2015-11-24 ENCOUNTER — Other Ambulatory Visit (HOSPITAL_BASED_OUTPATIENT_CLINIC_OR_DEPARTMENT_OTHER): Payer: Managed Care, Other (non HMO)

## 2015-11-24 ENCOUNTER — Ambulatory Visit (HOSPITAL_BASED_OUTPATIENT_CLINIC_OR_DEPARTMENT_OTHER): Payer: Managed Care, Other (non HMO) | Admitting: Hematology & Oncology

## 2015-11-24 ENCOUNTER — Encounter: Payer: Self-pay | Admitting: Hematology & Oncology

## 2015-11-24 VITALS — BP 120/74 | HR 57

## 2015-11-24 VITALS — BP 125/72 | HR 62 | Temp 98.0°F | Resp 16 | Ht 72.0 in | Wt 202.0 lb

## 2015-11-24 DIAGNOSIS — D751 Secondary polycythemia: Secondary | ICD-10-CM

## 2015-11-24 DIAGNOSIS — D45 Polycythemia vera: Secondary | ICD-10-CM | POA: Diagnosis not present

## 2015-11-24 LAB — COMPREHENSIVE METABOLIC PANEL
ALT: 20 U/L (ref 0–55)
AST: 22 U/L (ref 5–34)
Albumin: 4.1 g/dL (ref 3.5–5.0)
Alkaline Phosphatase: 54 U/L (ref 40–150)
Anion Gap: 11 mEq/L (ref 3–11)
BUN: 13.1 mg/dL (ref 7.0–26.0)
CO2: 24 mEq/L (ref 22–29)
Calcium: 8.9 mg/dL (ref 8.4–10.4)
Chloride: 106 mEq/L (ref 98–109)
Creatinine: 0.9 mg/dL (ref 0.7–1.3)
EGFR: 89 mL/min/{1.73_m2} — ABNORMAL LOW (ref >90)
Glucose: 105 mg/dl (ref 70–140)
Potassium: 3.6 mEq/L (ref 3.5–5.1)
Sodium: 141 mEq/L (ref 136–145)
Total Bilirubin: 0.9 mg/dL (ref 0.20–1.20)
Total Protein: 6.4 g/dL (ref 6.4–8.3)

## 2015-11-24 LAB — CBC WITH DIFFERENTIAL (CANCER CENTER ONLY)
BASO#: 0.2 10*3/uL (ref 0.0–0.2)
BASO%: 3 % — ABNORMAL HIGH (ref 0.0–2.0)
EOS%: 5 % (ref 0.0–7.0)
Eosinophils Absolute: 0.4 10*3/uL (ref 0.0–0.5)
HCT: 44.7 % (ref 38.7–49.9)
HGB: 14.2 g/dL (ref 13.0–17.1)
LYMPH#: 0.9 10*3/uL (ref 0.9–3.3)
LYMPH%: 13.2 % — ABNORMAL LOW (ref 14.0–48.0)
MCH: 24.4 pg — ABNORMAL LOW (ref 28.0–33.4)
MCHC: 31.8 g/dL — ABNORMAL LOW (ref 32.0–35.9)
MCV: 77 fL — ABNORMAL LOW (ref 82–98)
MONO#: 0.5 10*3/uL (ref 0.1–0.9)
MONO%: 7.2 % (ref 0.0–13.0)
NEUT#: 5 10*3/uL (ref 1.5–6.5)
NEUT%: 71.6 % (ref 40.0–80.0)
Platelets: 379 10*3/uL (ref 145–400)
RBC: 5.83 10*6/uL — ABNORMAL HIGH (ref 4.20–5.70)
RDW: 17.9 % — ABNORMAL HIGH (ref 11.1–15.7)
WBC: 7 10*3/uL (ref 4.0–10.0)

## 2015-11-24 NOTE — Progress Notes (Signed)
Hematology and Oncology Follow Up Visit  [name] [name]:[id number] Jan 13, 1956 60 y.o. 11/24/2015   Principle Diagnosis:  Polycythemia vera- JAK2 (+)  Current Therapy:    Phlebotomy to maintain hematocrit below 45%  Hydrea 1000 mg by mouth daily  Aspirin 81 mg by mouth daily     Interim History:  [name] [name] is back for followup. He looks good. It is been a tough month for him. His mom hadn't be put in the hospital and then into a nursing home. He is trying to help out there. His brother had a heart attack. As such, he's been doing a lot to help the family.  Thankfully, his 2 kids are doing okay.  Physically, he is doing pretty well. He's had no abdominal pain.  He's had no change in bowel or bladder habits.   He's had no rashes. He has had no joint issues. His hands do not seem to be bother him as much. He has had no headache. He has had no cough or shortness of breath. He has had no nausea.   Overall, his performance status is ECOG 0.   Medications:  Current outpatient prescriptions:  .  acetaminophen (TYLENOL) 500 MG tablet, Take 1,000 mg by mouth every 6 (six) hours as needed. For pain , Disp: , Rfl:  .  aspirin 81 MG tablet, Take 81 mg by mouth daily.  , Disp: , Rfl:  .  buPROPion (WELLBUTRIN SR) 150 MG 12 hr tablet, Take 150 mg by mouth every morning., Disp: , Rfl: 12 .  clidinium-chlordiazePOXIDE (LIBRAX) 5-2.5 MG capsule, TAKE 1 CAPSULE BY MOUTH AS NEEDED every 12 hrs, Disp: 60 capsule, Rfl: 2 .  doxazosin (CARDURA) 4 MG tablet, TAKE 1/2 TABLET AT BEDTIME FOR 1 WEEK THEN TAKE 1 TABLET BY MOUTH EVERY DAY AT BEDTIME, Disp: , Rfl: 11 .  doxycycline (PERIOSTAT) 20 MG tablet, Take 20 mg by mouth 2 (two) times daily., Disp: , Rfl: 11 .  hydroxyurea (HYDREA) 500 MG capsule, Take 2 capsules (1,000 mg total) by mouth daily. May take with food to minimize GI side effects., Disp: 60 capsule, Rfl: 6 .  hyoscyamine (LEVBID) 0.375 MG 12 hr tablet, Take 1 tablet (0.375 mg total) by  mouth as needed., Disp: 60 tablet, Rfl: 4 .  propranolol ER (INDERAL LA) 120 MG 24 hr capsule, TAKE ONE CAPSULE BY MOUTH EVERY DAY, Disp: 30 capsule, Rfl: 4 .  psyllium (METAMUCIL) 58.6 % powder, Take 1 packet by mouth daily., Disp: , Rfl:  .  zolpidem (AMBIEN CR) 12.5 MG CR tablet, Take by mouth as needed. For sleep pt usually takes 1/2 tablet, Disp: , Rfl:  .  [DISCONTINUED] propranolol (INNOPRAN XL) 80 MG 24 hr capsule, Take 1 capsule (80 mg total) by mouth at bedtime., Disp: 30 capsule, Rfl: 0  Allergies: No Known Allergies  Past Medical History, Surgical history, Social history, and Family History were reviewed and updated.  Review of Systems: As above  Physical Exam:  height is 6' (1.829 m) and weight is 202 lb (91.627 kg). His oral temperature is 98 F (36.7 C). His blood pressure is 125/72 and his pulse is 62. His respiration is 16.   Well-developed and well-nourished white gentleman. Head and neck exam shows no ocular or oral lesions. He does have the surgical incision on the lower eyelid and on the right eye. He has no scleral icterus. He has no adenopathy in the neck. Lungs are clear. Cardiac exam regular rate and rhythm with  no murmurs, rubs or bruits. Abdomen is soft. He has good bowel sounds. There is no fluid wave. There is no palpable liver or spleen tip. Extremities shows no clubbing, cyanosis or edema. No palpable venous cord is noted. Skin exam no rashes, ecchymosis or petechia. Neurological exam shows no focal neurological deficits.  Lab Results  Component Value Date   WBC 7.0 11/24/2015   HGB 14.2 11/24/2015   HCT 44.7 11/24/2015   MCV 77* 11/24/2015   PLT 379 11/24/2015     Chemistry      Component Value Date/Time   NA 141 11/24/2015 1350   NA 135 08/18/2015 1457   NA 140 04/14/2015 1520   NA 143 05/12/2014 1412   K 3.6 11/24/2015 1350   K 3.9 08/18/2015 1457   K 4.1 04/14/2015 1520   K 3.7 05/12/2014 1412   CL 102 08/18/2015 1457   CL 102 04/14/2015 1520     CL 105 05/12/2014 1412   CO2 24 11/24/2015 1350   CO2 24 08/18/2015 1457   CO2 31 04/14/2015 1520   CO2 29 05/12/2014 1412   BUN 13.1 11/24/2015 1350   BUN 16 08/18/2015 1457   BUN 12 04/14/2015 1520   BUN 10 05/12/2014 1412   CREATININE 0.9 11/24/2015 1350   CREATININE 0.82 08/18/2015 1457   CREATININE 0.96 04/14/2015 1520   CREATININE 0.8 05/12/2014 1412      Component Value Date/Time   CALCIUM 8.9 11/24/2015 1350   CALCIUM 9.0 08/18/2015 1457   CALCIUM 9.6 04/14/2015 1520   CALCIUM 9.7 05/12/2014 1412   ALKPHOS 54 11/24/2015 1350   ALKPHOS 52 08/18/2015 1457   ALKPHOS 50 04/14/2015 1520   ALKPHOS 58 05/12/2014 1412   AST 22 11/24/2015 1350   AST 26 08/18/2015 1457   AST 26 04/14/2015 1520   AST 42* 05/12/2014 1412   ALT 20 11/24/2015 1350   ALT 26 08/18/2015 1457   ALT 25 04/14/2015 1520   ALT 53* 05/12/2014 1412   BILITOT 0.90 11/24/2015 1350   BILITOT 0.6 08/18/2015 1457   BILITOT 0.7 04/14/2015 1520   BILITOT 1.00 05/12/2014 1412         Impression and Plan: [name] [name] is 60 year old gentleman with polycythemia vera. His blood count is doing much better on the higher dose of Hydrea.  We will need to phlebotomize him today. He is close enough to 45% that a phlebotomy would help.  I will plan to get another ultrasound of his abdomen. I want to see how everything looks with his spleen. I will try to get this set up in about 3-4 weeks.  I will plan to see him back in 2 months. I think this would be very reasonable.    [name], MD 7/20/20175:18 PM

## 2015-11-24 NOTE — Progress Notes (Signed)
[name] [name] presents today for phlebotomy per MD orders. Phlebotomy procedure started at 1450 and ended at 1500. 500 ml removed. Patient observed for 30 minutes after procedure without any incident. Patient tolerated procedure well. IV needle removed intact.

## 2015-11-24 NOTE — Patient Instructions (Signed)
Therapeutic Phlebotomy, Care After  Refer to this sheet in the next few weeks. These instructions provide you with information about caring for yourself after your procedure. Your health care provider may also give you more specific instructions. Your treatment has been planned according to current medical practices, but problems sometimes occur. Call your health care provider if you have any problems or questions after your procedure.  WHAT TO EXPECT AFTER THE PROCEDURE  After your procedure, it is common to have:   Light-headedness or dizziness. You may feel faint.   Nausea.   Tiredness.  HOME CARE INSTRUCTIONS  Activities   Return to your normal activities as directed by your health care provider. Most people can go back to their normal activities right away.   Avoid strenuous physical activity and heavy lifting or pulling for about 5 hours after the procedure. Do not lift anything that is heavier than 10 lb (4.5 kg).   Athletes should avoid strenuous exercise for at least 12 hours.   Change positions slowly for the remainder of the day. This will help to prevent light-headedness or fainting.   If you feel light-headed, lie down until the feeling goes away.  Eating and Drinking   Be sure to eat well-balanced meals for the next 24 hours.   Drink enough fluid to keep your urine clear or pale yellow.   Avoid drinking alcohol on the day that you had the procedure.  Care of the Needle Insertion Site   Keep your bandage dry. You can remove the bandage after about 5 hours or as directed by your health care provider.   If you have bleeding from the needle insertion site, elevate your arm and press firmly on the site until the bleeding stops.   If you have bruising at the site, apply ice to the area:   Put ice in a plastic bag.   Place a towel between your skin and the bag.   Leave the ice on for 20 minutes, 2-3 times a day for the first 24 hours.   If the swelling does not go away after 24 hours, apply  a warm, moist washcloth to the area for 20 minutes, 2-3 times a day.  General Instructions   Avoid smoking for at least 30 minutes after the procedure.   Keep all follow-up visits as directed by your health care provider. It is important to continue with further therapeutic phlebotomy treatments as directed.  SEEK MEDICAL CARE IF:   You have redness, swelling, or pain at the needle insertion site.   You have fluid, blood, or pus coming from the needle insertion site.   You feel light-headed, dizzy, or nauseated, and the feeling does not go away.   You notice new bruising at the needle insertion site.   You feel weaker than normal.   You have a fever or chills.  SEEK IMMEDIATE MEDICAL CARE IF:   You have severe nausea or vomiting.   You have chest pain.   You have trouble breathing.    This information is not intended to replace advice given to you by your health care provider. Make sure you discuss any questions you have with your health care provider.    Document Released: 09/25/2010 Document Revised: 09/07/2014 Document Reviewed: 04/19/2014  Elsevier Interactive Patient Education 2016 [employer]

## 2015-11-25 LAB — LACTATE DEHYDROGENASE: LDH: 234 U/L (ref 125–245)

## 2015-12-14 ENCOUNTER — Other Ambulatory Visit (HOSPITAL_BASED_OUTPATIENT_CLINIC_OR_DEPARTMENT_OTHER): Payer: Managed Care, Other (non HMO)

## 2015-12-15 ENCOUNTER — Ambulatory Visit (HOSPITAL_BASED_OUTPATIENT_CLINIC_OR_DEPARTMENT_OTHER): Payer: Managed Care, Other (non HMO)

## 2015-12-22 ENCOUNTER — Ambulatory Visit (HOSPITAL_BASED_OUTPATIENT_CLINIC_OR_DEPARTMENT_OTHER)
Admission: RE | Admit: 2015-12-22 | Discharge: 2015-12-22 | Disposition: A | Discharge status: Home / Self Care | Payer: Managed Care, Other (non HMO) | Source: Ambulatory Visit | Attending: Hematology & Oncology | Admitting: Hematology & Oncology

## 2015-12-22 DIAGNOSIS — N281 Cyst of kidney, acquired: Secondary | ICD-10-CM | POA: Diagnosis not present

## 2015-12-22 DIAGNOSIS — D751 Secondary polycythemia: Secondary | ICD-10-CM | POA: Diagnosis not present

## 2015-12-22 DIAGNOSIS — K824 Cholesterolosis of gallbladder: Secondary | ICD-10-CM | POA: Insufficient documentation

## 2016-02-16 ENCOUNTER — Encounter: Payer: Self-pay | Admitting: Hematology & Oncology

## 2016-02-16 ENCOUNTER — Other Ambulatory Visit (HOSPITAL_BASED_OUTPATIENT_CLINIC_OR_DEPARTMENT_OTHER): Payer: Managed Care, Other (non HMO)

## 2016-02-16 ENCOUNTER — Ambulatory Visit (HOSPITAL_BASED_OUTPATIENT_CLINIC_OR_DEPARTMENT_OTHER): Payer: Managed Care, Other (non HMO) | Admitting: Hematology & Oncology

## 2016-02-16 ENCOUNTER — Ambulatory Visit (HOSPITAL_BASED_OUTPATIENT_CLINIC_OR_DEPARTMENT_OTHER): Payer: Managed Care, Other (non HMO)

## 2016-02-16 VITALS — BP 127/70 | HR 57 | Temp 98.0°F | Resp 16 | Ht 72.0 in | Wt 203.4 lb

## 2016-02-16 VITALS — BP 148/84 | HR 51 | Resp 16

## 2016-02-16 DIAGNOSIS — D751 Secondary polycythemia: Secondary | ICD-10-CM

## 2016-02-16 DIAGNOSIS — D45 Polycythemia vera: Secondary | ICD-10-CM

## 2016-02-16 DIAGNOSIS — R161 Splenomegaly, not elsewhere classified: Secondary | ICD-10-CM | POA: Diagnosis not present

## 2016-02-16 LAB — CHCC SATELLITE - SMEAR

## 2016-02-16 LAB — CBC WITH DIFFERENTIAL (CANCER CENTER ONLY)
BASO#: 0.2 10*3/uL (ref 0.0–0.2)
BASO%: 2.6 % — ABNORMAL HIGH (ref 0.0–2.0)
EOS%: 4.4 % (ref 0.0–7.0)
Eosinophils Absolute: 0.4 10*3/uL (ref 0.0–0.5)
HCT: 46.1 % (ref 38.7–49.9)
HGB: 14.5 g/dL (ref 13.0–17.1)
LYMPH#: 1 10*3/uL (ref 0.9–3.3)
LYMPH%: 12.8 % — ABNORMAL LOW (ref 14.0–48.0)
MCH: 24 pg — ABNORMAL LOW (ref 28.0–33.4)
MCHC: 31.5 g/dL — ABNORMAL LOW (ref 32.0–35.9)
MCV: 77 fL — ABNORMAL LOW (ref 82–98)
MONO#: 0.5 10*3/uL (ref 0.1–0.9)
MONO%: 6.7 % (ref 0.0–13.0)
NEUT#: 5.9 10*3/uL (ref 1.5–6.5)
NEUT%: 73.5 % (ref 40.0–80.0)
Platelets: 339 10*3/uL (ref 145–400)
RBC: 6.03 10*6/uL — ABNORMAL HIGH (ref 4.20–5.70)
RDW: 18.1 % — ABNORMAL HIGH (ref 11.1–15.7)
WBC: 8 10*3/uL (ref 4.0–10.0)

## 2016-02-16 LAB — COMPREHENSIVE METABOLIC PANEL (CC13)
ALT: 30 IU/L (ref 0–44)
AST (SGOT): 26 IU/L (ref 0–40)
Albumin, Serum: 4.5 g/dL (ref 3.5–5.5)
Albumin/Globulin Ratio: 2.1 (ref 1.2–2.2)
Alkaline Phosphatase, S: 56 IU/L (ref 39–117)
BUN/Creatinine Ratio: 15 (ref 9–20)
BUN: 13 mg/dL (ref 6–24)
Bilirubin Total: 0.6 mg/dL (ref 0.0–1.2)
Calcium, Ser: 9.3 mg/dL (ref 8.7–10.2)
Carbon Dioxide, Total: 27 mmol/L (ref 18–29)
Chloride, Ser: 103 mmol/L (ref 96–106)
Creatinine, Ser: 0.89 mg/dL (ref 0.76–1.27)
GFR calc Af Amer: 109 mL/min/{1.73_m2} (ref >59)
GFR calc non Af Amer: 94 mL/min/{1.73_m2} (ref >59)
Globulin, Total: 2.1 g/dL (ref 1.5–4.5)
Glucose: 92 mg/dL (ref 65–99)
Potassium, Ser: 3.9 mmol/L (ref 3.5–5.2)
Sodium: 136 mmol/L (ref 134–144)
Total Protein: 6.6 g/dL (ref 6.0–8.5)

## 2016-02-16 NOTE — Progress Notes (Signed)
Hematology and Oncology Follow Up Visit  [name] [name] [id number] 1955/09/27 59 y.o. 02/16/2016   Principle Diagnosis:  Polycythemia vera- JAK2 (+)  Current Therapy:    Phlebotomy to maintain hematocrit below 45%  Hydrea 1000 mg by mouth daily  Aspirin 81 mg by mouth daily     Interim History:  [name] is back for followup. He looks good. It is been a tough month for him. His mom finally passed away. She passed away about a month ago. She is 58 years old. This is been a little difficult for him. He has had to pretty much take care of most of the arrangements. I think he has a brother who might have some physical and special needs. Thankfully, his 2 daughters came home for the funeral. This helped out quite a bit.   We did do a ultrasound of his abdomen when I last saw him. His splenic size has increased. His spleen now is 972 cm3.    He is really not having any pain over on the left side of his abdomen. He is not having any fevers. He's having no urinary issues. Prevacid is that this morning, he got up from a meeting and felt dizzy. He is on baby aspirin.   He is doing well with the Hydrea.   His been no nausea or vomiting. He has had no diarrhea. He's had no leg swelling. He's had no rashes.   Overall, his performance status is ECOG 0.   Medications:  Current Outpatient Prescriptions:  .  acetaminophen (TYLENOL) 500 MG tablet, Take 1,000 mg by mouth every 6 (six) hours as needed. For pain , Disp: , Rfl:  .  aspirin 81 MG tablet, Take 81 mg by mouth daily.  , Disp: , Rfl:  .  buPROPion (WELLBUTRIN SR) 150 MG 12 hr tablet, Take 150 mg by mouth every morning., Disp: , Rfl: 12 .  clidinium-chlordiazePOXIDE (LIBRAX) 5-2.5 MG capsule, TAKE 1 CAPSULE BY MOUTH AS NEEDED every 12 hrs, Disp: 60 capsule, Rfl: 2 .  doxazosin (CARDURA) 4 MG tablet, TAKE 1/2 TABLET AT BEDTIME FOR 1 WEEK THEN TAKE 1 TABLET BY MOUTH EVERY DAY AT BEDTIME, Disp: , Rfl: 11 .  doxycycline (PERIOSTAT) 20 MG  tablet, Take 20 mg by mouth 2 (two) times daily., Disp: , Rfl: 11 .  hydroxyurea (HYDREA) 500 MG capsule, Take 2 capsules (1,000 mg total) by mouth daily. May take with food to minimize GI side effects., Disp: 60 capsule, Rfl: 6 .  hyoscyamine (LEVBID) 0.375 MG 12 hr tablet, Take 1 tablet (0.375 mg total) by mouth as needed., Disp: 60 tablet, Rfl: 4 .  propranolol ER (INDERAL LA) 120 MG 24 hr capsule, TAKE ONE CAPSULE BY MOUTH EVERY DAY, Disp: 30 capsule, Rfl: 4 .  psyllium (METAMUCIL) 58.6 % powder, Take 1 packet by mouth daily., Disp: , Rfl:  .  zolpidem (AMBIEN CR) 12.5 MG CR tablet, Take by mouth as needed. For sleep pt usually takes 1/2 tablet, Disp: , Rfl:   Allergies: No Known Allergies  Past Medical History, Surgical history, Social history, and Family History were reviewed and updated.  Review of Systems: As above  Physical Exam:  height is 6' (1.829 m) and weight is 203 lb 6.4 oz (92.3 kg). His oral temperature is 98 F (36.7 C). His blood pressure is 127/70 and his pulse is 57 (abnormal). His respiration is 16.   Well-developed and well-nourished white gentleman. Head and neck exam shows no ocular or  oral lesions. He does have the surgical incision on the lower eyelid and on the right eye. He has no scleral icterus. He has no adenopathy in the neck. Lungs are clear. Cardiac exam regular rate and rhythm with no murmurs, rubs or bruits. Abdomen is soft. He has good bowel sounds. There is no fluid wave. There is no palpable liver or spleen tip. Extremities shows no clubbing, cyanosis or edema. No palpable venous cord is noted. Skin exam no rashes, ecchymosis or petechia. Neurological exam shows no focal neurological deficits.  Lab Results  Component Value Date   WBC 8.0 02/16/2016   HGB 14.5 02/16/2016   HCT 46.1 02/16/2016   MCV 77 (L) 02/16/2016   PLT 339 02/16/2016     Chemistry      Component Value Date/Time   NA 141 11/24/2015 1350   K 3.6 11/24/2015 1350   CL 102  08/18/2015 1457   CL 105 05/12/2014 1412   CO2 24 11/24/2015 1350   BUN 13.1 11/24/2015 1350   CREATININE 0.9 11/24/2015 1350      Component Value Date/Time   CALCIUM 8.9 11/24/2015 1350   ALKPHOS 54 11/24/2015 1350   AST 22 11/24/2015 1350   ALT 20 11/24/2015 1350   BILITOT 0.90 11/24/2015 1350         Impression and Plan: [name] [name] is 60 year old gentleman with polycythemia vera.  I am a little bit worried about his spleen being larger. We will have to watch this closely. I probably will do another ultrasound in about 6 months.   I do not see that we have to utilize [name] [name] as of yet. I'll see any other indications that we would have to use this. He is not having pain from splenomegaly. He's having no systemic symptoms. There is no fevers. He's had no weight loss. Had no sweats.   We will go ahead and phlebotomize him today. We will then plan to get him back in another 6 weeks. I would like to see him back right around Thanksgiving and that way we can make sure his blood is okay for the holidays.   I am sorry that his mom passed away about a month ago. I know that she had her health issues but it is still difficult.    [name], MD 10/12/20174:27 PM

## 2016-02-16 NOTE — Progress Notes (Signed)
Abbe Amsterdam presents today for phlebotomy per MD orders. Phlebotomy procedure started at 1624 and ended at 1640. Approximately 500 mls removed. Patient observed for 30 minutes after procedure without any incident. Patient tolerated procedure well. IV needle removed intact.

## 2016-02-16 NOTE — Patient Instructions (Signed)
Therapeutic Phlebotomy, Care After  Refer to this sheet in the next few weeks. These instructions provide you with information about caring for yourself after your procedure. Your health care provider may also give you more specific instructions. Your treatment has been planned according to current medical practices, but problems sometimes occur. Call your health care provider if you have any problems or questions after your procedure.  WHAT TO EXPECT AFTER THE PROCEDURE  After your procedure, it is common to have:   Light-headedness or dizziness. You may feel faint.   Nausea.   Tiredness.  HOME CARE INSTRUCTIONS  Activities   Return to your normal activities as directed by your health care provider. Most people can go back to their normal activities right away.   Avoid strenuous physical activity and heavy lifting or pulling for about 5 hours after the procedure. Do not lift anything that is heavier than 10 lb (4.5 kg).   Athletes should avoid strenuous exercise for at least 12 hours.   Change positions slowly for the remainder of the day. This will help to prevent light-headedness or fainting.   If you feel light-headed, lie down until the feeling goes away.  Eating and Drinking   Be sure to eat well-balanced meals for the next 24 hours.   Drink enough fluid to keep your urine clear or pale yellow.   Avoid drinking alcohol on the day that you had the procedure.  Care of the Needle Insertion Site   Keep your bandage dry. You can remove the bandage after about 5 hours or as directed by your health care provider.   If you have bleeding from the needle insertion site, elevate your arm and press firmly on the site until the bleeding stops.   If you have bruising at the site, apply ice to the area:   Put ice in a plastic bag.   Place a towel between your skin and the bag.   Leave the ice on for 20 minutes, 2-3 times a day for the first 24 hours.   If the swelling does not go away after 24 hours, apply  a warm, moist washcloth to the area for 20 minutes, 2-3 times a day.  General Instructions   Avoid smoking for at least 30 minutes after the procedure.   Keep all follow-up visits as directed by your health care provider. It is important to continue with further therapeutic phlebotomy treatments as directed.  SEEK MEDICAL CARE IF:   You have redness, swelling, or pain at the needle insertion site.   You have fluid, blood, or pus coming from the needle insertion site.   You feel light-headed, dizzy, or nauseated, and the feeling does not go away.   You notice new bruising at the needle insertion site.   You feel weaker than normal.   You have a fever or chills.  SEEK IMMEDIATE MEDICAL CARE IF:   You have severe nausea or vomiting.   You have chest pain.   You have trouble breathing.    This information is not intended to replace advice given to you by your health care provider. Make sure you discuss any questions you have with your health care provider.    Document Released: 09/25/2010 Document Revised: 09/07/2014 Document Reviewed: 04/19/2014  Elsevier Interactive Patient Education 2016 [employer]

## 2016-02-17 LAB — LACTATE DEHYDROGENASE: LDH: 278 U/L — ABNORMAL HIGH (ref 125–245)

## 2016-03-28 ENCOUNTER — Other Ambulatory Visit: Payer: Self-pay | Admitting: Hematology & Oncology

## 2016-03-28 DIAGNOSIS — D751 Secondary polycythemia: Secondary | ICD-10-CM

## 2016-03-30 ENCOUNTER — Ambulatory Visit: Payer: Managed Care, Other (non HMO) | Admitting: Hematology & Oncology

## 2016-03-30 ENCOUNTER — Other Ambulatory Visit: Payer: Managed Care, Other (non HMO)

## 2016-04-12 ENCOUNTER — Other Ambulatory Visit (HOSPITAL_BASED_OUTPATIENT_CLINIC_OR_DEPARTMENT_OTHER): Payer: Managed Care, Other (non HMO)

## 2016-04-12 ENCOUNTER — Ambulatory Visit (HOSPITAL_BASED_OUTPATIENT_CLINIC_OR_DEPARTMENT_OTHER): Payer: Managed Care, Other (non HMO)

## 2016-04-12 ENCOUNTER — Ambulatory Visit (HOSPITAL_BASED_OUTPATIENT_CLINIC_OR_DEPARTMENT_OTHER): Payer: Managed Care, Other (non HMO) | Admitting: Hematology & Oncology

## 2016-04-12 VITALS — BP 125/79 | HR 63 | Temp 97.9°F | Resp 18 | Wt 206.0 lb

## 2016-04-12 VITALS — BP 124/79 | HR 68

## 2016-04-12 DIAGNOSIS — E291 Testicular hypofunction: Secondary | ICD-10-CM

## 2016-04-12 DIAGNOSIS — D751 Secondary polycythemia: Secondary | ICD-10-CM

## 2016-04-12 DIAGNOSIS — D45 Polycythemia vera: Secondary | ICD-10-CM

## 2016-04-12 DIAGNOSIS — R002 Palpitations: Secondary | ICD-10-CM | POA: Diagnosis not present

## 2016-04-12 DIAGNOSIS — D509 Iron deficiency anemia, unspecified: Secondary | ICD-10-CM | POA: Diagnosis not present

## 2016-04-12 LAB — CBC WITH DIFFERENTIAL (CANCER CENTER ONLY)
BASO#: 0.3 10*3/uL — ABNORMAL HIGH (ref 0.0–0.2)
BASO%: 2.6 % — ABNORMAL HIGH (ref 0.0–2.0)
EOS%: 4.2 % (ref 0.0–7.0)
Eosinophils Absolute: 0.4 10*3/uL (ref 0.0–0.5)
HCT: 46.6 % (ref 38.7–49.9)
HGB: 14.8 g/dL (ref 13.0–17.1)
LYMPH#: 1.2 10*3/uL (ref 0.9–3.3)
LYMPH%: 11.2 % — ABNORMAL LOW (ref 14.0–48.0)
MCH: 24 pg — ABNORMAL LOW (ref 28.0–33.4)
MCHC: 31.8 g/dL — ABNORMAL LOW (ref 32.0–35.9)
MCV: 76 fL — ABNORMAL LOW (ref 82–98)
MONO#: 0.8 10*3/uL (ref 0.1–0.9)
MONO%: 7.6 % (ref 0.0–13.0)
NEUT#: 7.7 10*3/uL — ABNORMAL HIGH (ref 1.5–6.5)
NEUT%: 74.4 % (ref 40.0–80.0)
Platelets: 628 10*3/uL — ABNORMAL HIGH (ref 145–400)
RBC: 6.17 10*6/uL — ABNORMAL HIGH (ref 4.20–5.70)
RDW: 17.7 % — ABNORMAL HIGH (ref 11.1–15.7)
WBC: 10.3 10*3/uL — ABNORMAL HIGH (ref 4.0–10.0)

## 2016-04-12 LAB — COMPREHENSIVE METABOLIC PANEL
ALT: 36 U/L (ref 0–55)
AST: 31 U/L (ref 5–34)
Albumin: 4.2 g/dL (ref 3.5–5.0)
Alkaline Phosphatase: 66 U/L (ref 40–150)
Anion Gap: 10 mEq/L (ref 3–11)
BUN: 16.3 mg/dL (ref 7.0–26.0)
CO2: 22 mEq/L (ref 22–29)
Calcium: 9.1 mg/dL (ref 8.4–10.4)
Chloride: 107 mEq/L (ref 98–109)
Creatinine: 0.9 mg/dL (ref 0.7–1.3)
EGFR: >90 mL/min/{1.73_m2} (ref >90)
Glucose: 91 mg/dl (ref 70–140)
Potassium: 4.1 mEq/L (ref 3.5–5.1)
Sodium: 139 mEq/L (ref 136–145)
Total Bilirubin: 0.73 mg/dL (ref 0.20–1.20)
Total Protein: 7 g/dL (ref 6.4–8.3)

## 2016-04-12 LAB — IRON AND TIBC
%SAT: 7 % — ABNORMAL LOW (ref 20–55)
Iron: 32 ug/dL — ABNORMAL LOW (ref 42–163)
TIBC: 469 ug/dL — ABNORMAL HIGH (ref 202–409)
UIBC: 437 ug/dL — ABNORMAL HIGH (ref 117–376)

## 2016-04-12 LAB — LACTATE DEHYDROGENASE: LDH: 346 U/L — ABNORMAL HIGH (ref 125–245)

## 2016-04-12 LAB — FERRITIN: Ferritin: 7 ng/ml — ABNORMAL LOW (ref 22–316)

## 2016-04-12 LAB — CHCC SATELLITE - SMEAR

## 2016-04-12 NOTE — Progress Notes (Signed)
Hematology and Oncology Follow Up Visit  [name] [name] [id number] [birth year] 60 y.o. 04/12/2016   Principle Diagnosis:  Polycythemia vera- JAK2 (+)  Current Therapy:    Phlebotomy to maintain hematocrit below 45%  Hydrea 1000 mg by mouth daily  Aspirin 81 mg by mouth daily     Interim History:  [name] [name] is back for followup. He doesn't feel all that well. He has arthralgias. He has some myalgias. He has some fatigue.  He is trying to move his challenged brother down close to home. This is been a lot of bit of a struggle.  He did have a nice Thanksgiving.  He does have iron deficiency. We have may have iron deficient with his phlebotomies. He has not has iron levels checked probably since April. His ferritin was 12 area and his iron saturation was 6%. I will have to follow-up with this.  I am not sure if his testosterone level has been checked. This may also be some other issue.  He's had some fatigue. He's had some leg achiness. He's had no change in bowel or bladder habits. He has had some discomfort under the left arm.   Overall, his performance status is ECOG 1.   Medications:  Current Outpatient Prescriptions:  .  acetaminophen (TYLENOL) 500 MG tablet, Take 1,000 mg by mouth every 6 (six) hours as needed. For pain , Disp: , Rfl:  .  aspirin 81 MG tablet, Take 81 mg by mouth daily.  , Disp: , Rfl:  .  buPROPion (WELLBUTRIN SR) 150 MG 12 hr tablet, Take 150 mg by mouth every morning., Disp: , Rfl: 12 .  clidinium-chlordiazePOXIDE (LIBRAX) 5-2.5 MG capsule, TAKE 1 CAPSULE BY MOUTH AS NEEDED every 12 hrs, Disp: 60 capsule, Rfl: 2 .  doxazosin (CARDURA) 4 MG tablet, TAKE 1/2 TABLET AT BEDTIME FOR 1 WEEK THEN TAKE 1 TABLET BY MOUTH EVERY DAY AT BEDTIME, Disp: , Rfl: 11 .  doxycycline (PERIOSTAT) 20 MG tablet, Take 20 mg by mouth 2 (two) times daily., Disp: , Rfl: 11 .  hydroxyurea (HYDREA) 500 MG capsule, TAKE 2 CAPSULES BY MOUTH DAILY. MAY TAKE WITH FOOD TO MINIMIZE GI  SIDE EFFECTS, Disp: 60 capsule, Rfl: 5 .  hyoscyamine (LEVBID) 0.375 MG 12 hr tablet, Take 1 tablet (0.375 mg total) by mouth as needed., Disp: 60 tablet, Rfl: 4 .  propranolol ER (INDERAL LA) 120 MG 24 hr capsule, TAKE ONE CAPSULE BY MOUTH EVERY DAY, Disp: 30 capsule, Rfl: 4 .  psyllium (METAMUCIL) 58.6 % powder, Take 1 packet by mouth daily., Disp: , Rfl:  .  zolpidem (AMBIEN CR) 12.5 MG CR tablet, Take by mouth as needed. For sleep pt usually takes 1/2 tablet, Disp: , Rfl:   Allergies: No Known Allergies  Past Medical History, Surgical history, Social history, and Family History were reviewed and updated.  Review of Systems: As above  Physical Exam:  weight is 206 lb (93.4 kg). His oral temperature is 97.9 F (36.6 C). His blood pressure is 125/79 and his pulse is 63. His respiration is 18.   Well-developed and well-nourished white gentleman. Head and neck exam shows no ocular or oral lesions. He does have the surgical incision on the lower eyelid and on the right eye. He has no scleral icterus. He has no adenopathy in the neck. Lungs are clear. Cardiac exam regular rate and rhythm with no murmurs, rubs or bruits. Abdomen is soft. He has good bowel sounds. There is no fluid wave. There  is no palpable liver or spleen tip. Extremities shows no clubbing, cyanosis or edema. No palpable venous cord is noted. Skin exam no rashes, ecchymosis or petechia. Neurological exam shows no focal neurological deficits.  Lab Results  Component Value Date   WBC 10.3 (H) 04/12/2016   HGB 14.8 04/12/2016   HCT 46.6 04/12/2016   MCV 76 (L) 04/12/2016   PLT 628 (H) 04/12/2016     Chemistry      Component Value Date/Time   NA 136 02/16/2016 1451   NA 141 11/24/2015 1350   K 3.9 02/16/2016 1451   K 3.6 11/24/2015 1350   CL 103 02/16/2016 1451   CL 105 05/12/2014 1412   CO2 27 02/16/2016 1451   CO2 24 11/24/2015 1350   BUN 13 02/16/2016 1451   BUN 13.1 11/24/2015 1350   CREATININE 0.89 02/16/2016  1451   CREATININE 0.9 11/24/2015 1350      Component Value Date/Time   CALCIUM 9.3 02/16/2016 1451   CALCIUM 8.9 11/24/2015 1350   ALKPHOS 56 02/16/2016 1451   ALKPHOS 54 11/24/2015 1350   AST 26 02/16/2016 1451   AST 22 11/24/2015 1350   ALT 30 02/16/2016 1451   ALT 20 11/24/2015 1350   BILITOT 0.6 02/16/2016 1451   BILITOT 0.90 11/24/2015 1350         Impression and Plan: [name] [name] is 60 year old gentleman with polycythemia vera.  I Am concerned about his platelet count being high now. I'm not sure as to why it would be so high.  I told him to take 2 of the 500 mg Hydrea pills daily. He apparently is alternating 2 pills a day with one pill a day.   He clearly needs to be phlebotomized. We will phlebotomize him today.   It is possible that we may have to consider changing him over to [place]. If we consider this, we may have to do a bone marrow test on him. He probably has not had a bone marrow test for over 10 years.   Some of the symptoms that he has I think are reflective of his polycythemia. I just hope that he is not trying to transform over to another myeloproliferative disorder.   I will see him back in one month.     [name], MD 12/7/201712:55 PM

## 2016-04-12 NOTE — Patient Instructions (Signed)
     Therapeutic Phlebotomy, Care After Refer to this sheet in the next few weeks. These instructions provide you with information about caring for yourself after your procedure. Your health care provider may also give you more specific instructions. Your treatment has been planned according to current medical practices, but problems sometimes occur. Call your health care provider if you have any problems or questions after your procedure. What can I expect after the procedure? After the procedure, it is common to have:  Light-headedness or dizziness. You may feel faint.  Nausea.  Tiredness. Follow these instructions at home: Activity  Return to your normal activities as directed by your health care provider. Most people can go back to their normal activities right away.  Avoid strenuous physical activity and heavy lifting or pulling for about 5 hours after the procedure. Do not lift anything that is heavier than 10 lb (4.5 kg).  Athletes should avoid strenuous exercise for at least 12 hours.  Change positions slowly for the remainder of the day. This will help to prevent light-headedness or fainting.  If you feel light-headed, lie down until the feeling goes away. Eating and drinking  Be sure to eat well-balanced meals for the next 24 hours.  Drink enough fluid to keep your urine clear or pale yellow.  Avoid drinking alcohol on the day that you had the procedure. Care of the Needle Insertion Site  Keep your bandage dry. You can remove the bandage after about 5 hours or as directed by your health care provider.  If you have bleeding from the needle insertion site, elevate your arm and press firmly on the site until the bleeding stops.  If you have bruising at the site, apply ice to the area:  Put ice in a plastic bag.  Place a towel between your skin and the bag.  Leave the ice on for 20 minutes, 2-3 times a day for the first 24 hours.  If the swelling does not go away  after 24 hours, apply a warm, moist washcloth to the area for 20 minutes, 2-3 times a day. General instructions  Avoid smoking for at least 30 minutes after the procedure.  Keep all follow-up visits as directed by your health care provider. It is important to continue with further therapeutic phlebotomy treatments as directed. Contact a health care provider if:  You have redness, swelling, or pain at the needle insertion site.  You have fluid, blood, or pus coming from the needle insertion site.  You feel light-headed, dizzy, or nauseated, and the feeling does not go away.  You notice new bruising at the needle insertion site.  You feel weaker than normal.  You have a fever or chills. Get help right away if:  You have severe nausea or vomiting.  You have chest pain.  You have trouble breathing. This information is not intended to replace advice given to you by your health care provider. Make sure you discuss any questions you have with your health care provider. Document Released: 09/25/2010 Document Revised: 12/24/2015 Document Reviewed: 04/19/2014 Elsevier Interactive Patient Education  2017 [employer]  

## 2016-04-12 NOTE — Progress Notes (Signed)
[name] [name] presents today for phlebotomy per MD orders. Phlebotomy procedure started at 1300 and ended at 1315. 500 grams removed. Patient observed for 30 minutes after procedure without any incident. Patient tolerated procedure well. IV needle removed intact.

## 2016-04-16 ENCOUNTER — Other Ambulatory Visit: Payer: Self-pay | Admitting: Hematology & Oncology

## 2016-05-24 ENCOUNTER — Other Ambulatory Visit (HOSPITAL_BASED_OUTPATIENT_CLINIC_OR_DEPARTMENT_OTHER): Payer: Managed Care, Other (non HMO)

## 2016-05-24 ENCOUNTER — Ambulatory Visit (HOSPITAL_BASED_OUTPATIENT_CLINIC_OR_DEPARTMENT_OTHER): Payer: Managed Care, Other (non HMO) | Admitting: Hematology & Oncology

## 2016-05-24 ENCOUNTER — Ambulatory Visit (HOSPITAL_BASED_OUTPATIENT_CLINIC_OR_DEPARTMENT_OTHER): Payer: Managed Care, Other (non HMO)

## 2016-05-24 VITALS — BP 129/80 | HR 62 | Temp 98.2°F | Wt 205.0 lb

## 2016-05-24 VITALS — BP 126/82 | HR 61

## 2016-05-24 DIAGNOSIS — D45 Polycythemia vera: Secondary | ICD-10-CM

## 2016-05-24 DIAGNOSIS — E291 Testicular hypofunction: Secondary | ICD-10-CM

## 2016-05-24 DIAGNOSIS — G629 Polyneuropathy, unspecified: Secondary | ICD-10-CM

## 2016-05-24 DIAGNOSIS — D509 Iron deficiency anemia, unspecified: Secondary | ICD-10-CM | POA: Diagnosis not present

## 2016-05-24 DIAGNOSIS — G63 Polyneuropathy in diseases classified elsewhere: Secondary | ICD-10-CM

## 2016-05-24 DIAGNOSIS — D751 Secondary polycythemia: Secondary | ICD-10-CM

## 2016-05-24 DIAGNOSIS — E8809 Other disorders of plasma-protein metabolism, not elsewhere classified: Secondary | ICD-10-CM

## 2016-05-24 DIAGNOSIS — R002 Palpitations: Secondary | ICD-10-CM

## 2016-05-24 LAB — CMP (CANCER CENTER ONLY)
ALT(SGPT): 33 U/L (ref 10–47)
AST: 31 U/L (ref 11–38)
Albumin: 4.2 g/dL (ref 3.3–5.5)
Alkaline Phosphatase: 57 U/L (ref 26–84)
BUN, Bld: 10 mg/dL (ref 7–22)
CO2: 27 mEq/L (ref 18–33)
Calcium: 9.2 mg/dL (ref 8.0–10.3)
Chloride: 105 mEq/L (ref 98–108)
Creat: 1.1 mg/dl (ref 0.6–1.2)
Glucose, Bld: 105 mg/dL (ref 73–118)
Potassium: 3.8 mEq/L (ref 3.3–4.7)
Sodium: 137 mEq/L (ref 128–145)
Total Bilirubin: 1 mg/dl (ref 0.20–1.60)
Total Protein: 6.5 g/dL (ref 6.4–8.1)

## 2016-05-24 LAB — CHCC SATELLITE - SMEAR

## 2016-05-24 LAB — CBC WITH DIFFERENTIAL (CANCER CENTER ONLY)
BASO#: 0.3 10*3/uL — ABNORMAL HIGH (ref 0.0–0.2)
BASO%: 2.9 % — ABNORMAL HIGH (ref 0.0–2.0)
EOS%: 4 % (ref 0.0–7.0)
Eosinophils Absolute: 0.4 10*3/uL (ref 0.0–0.5)
HCT: 46.5 % (ref 38.7–49.9)
HGB: 14.8 g/dL (ref 13.0–17.1)
LYMPH#: 1.2 10*3/uL (ref 0.9–3.3)
LYMPH%: 12.5 % — ABNORMAL LOW (ref 14.0–48.0)
MCH: 24.4 pg — ABNORMAL LOW (ref 28.0–33.4)
MCHC: 31.8 g/dL — ABNORMAL LOW (ref 32.0–35.9)
MCV: 77 fL — ABNORMAL LOW (ref 82–98)
MONO#: 0.6 10*3/uL (ref 0.1–0.9)
MONO%: 6.6 % (ref 0.0–13.0)
NEUT#: 7.2 10*3/uL — ABNORMAL HIGH (ref 1.5–6.5)
NEUT%: 74 % (ref 40.0–80.0)
Platelets: 375 10*3/uL (ref 145–400)
RBC: 6.06 10*6/uL — ABNORMAL HIGH (ref 4.20–5.70)
RDW: 19.3 % — ABNORMAL HIGH (ref 11.1–15.7)
WBC: 9.7 10*3/uL (ref 4.0–10.0)

## 2016-05-24 MED ORDER — VITAMIN B-6 250 MG PO TABS: 250.0000 mg | ORAL_TABLET | Freq: Every day | ORAL | 12 refills | Status: DC

## 2016-05-24 NOTE — Progress Notes (Signed)
Hematology and Oncology Follow Up Visit  [name] [name] [id number] 1955/11/04 61 y.o. 05/24/2016   Principle Diagnosis:  Polycythemia vera- JAK2 (+)  Current Therapy:    Phlebotomy to maintain hematocrit below 45%  Hydrea 1000 mg by mouth daily  Aspirin 81 mg by mouth daily     Interim History:  [name] is back for followup. He feels a little bit better. He was quite busy over the holidays. He and his wife help moved his brother closer to home. This was quite a struggle.   He is complaining of some tingling in his fingers when he wakes up. After believe that this is something that is anatomic. I don't think it has anything to do with his polycythemia or with him having some mild erythrocytosis. He is on aspirin. As such, I think he has decent circulation.   He is having some abdominal spasms. Again this might be from low iron. His pain is more on the right side and not the left side. He does have some splenomegaly which could be causing some discomfort on the left side. This does not appear to be much of a problem for him.   He has had no fever. He does have some occasional flushing.  There is no diarrhea or constipation.   Overall, his performance status is ECOG 1.   Medications:  Current Outpatient Prescriptions:  .  acetaminophen (TYLENOL) 500 MG tablet, Take 1,000 mg by mouth every 6 (six) hours as needed. For pain , Disp: , Rfl:  .  aspirin 81 MG tablet, Take 81 mg by mouth daily.  , Disp: , Rfl:  .  buPROPion (WELLBUTRIN SR) 150 MG 12 hr tablet, Take 150 mg by mouth every morning., Disp: , Rfl: 12 .  clidinium-chlordiazePOXIDE (LIBRAX) 5-2.5 MG capsule, TAKE 1 CAPSULE BY MOUTH AS NEEDED every 12 hrs, Disp: 60 capsule, Rfl: 2 .  doxycycline (PERIOSTAT) 20 MG tablet, Take 20 mg by mouth 2 (two) times daily., Disp: , Rfl: 11 .  gabapentin (NEURONTIN) 300 MG capsule, Take 300 mg by mouth every morning., Disp: , Rfl:  .  hydroxyurea (HYDREA) 500 MG capsule, TAKE 2 CAPSULES  BY MOUTH DAILY. MAY TAKE WITH FOOD TO MINIMIZE GI SIDE EFFECTS, Disp: 60 capsule, Rfl: 5 .  hyoscyamine (LEVBID) 0.375 MG 12 hr tablet, Take 1 tablet (0.375 mg total) by mouth as needed., Disp: 60 tablet, Rfl: 4 .  propranolol ER (INDERAL LA) 120 MG 24 hr capsule, TAKE ONE CAPSULE BY MOUTH EVERY DAY, Disp: 30 capsule, Rfl: 4 .  zolpidem (AMBIEN CR) 12.5 MG CR tablet, Take by mouth as needed. For sleep pt usually takes 1/2 tablet, Disp: , Rfl:   Allergies: No Known Allergies  Past Medical History, Surgical history, Social history, and Family History were reviewed and updated.  Review of Systems: As above  Physical Exam:  weight is 205 lb (93 kg). His oral temperature is 98.2 F (36.8 C). His blood pressure is 129/80 and his pulse is 62.   Well-developed and well-nourished white gentleman. Head and neck exam shows no ocular or oral lesions. He does have the surgical incision on the lower eyelid and on the right eye. He has no scleral icterus. He has no adenopathy in the neck. Lungs are clear. Cardiac exam regular rate and rhythm with no murmurs, rubs or bruits. Abdomen is soft. He has good bowel sounds. There is no fluid wave. There is no palpable liver or spleen tip. Extremities shows no clubbing,  cyanosis or edema. No palpable venous cord is noted. Skin exam no rashes, ecchymosis or petechia. Neurological exam shows no focal neurological deficits.  Lab Results  Component Value Date   WBC 9.7 05/24/2016   HGB 14.8 05/24/2016   HCT 46.5 05/24/2016   MCV 77 (L) 05/24/2016   PLT 375 05/24/2016     Chemistry      Component Value Date/Time   NA 137 05/24/2016 1446   NA 139 04/12/2016 1113   K 3.8 05/24/2016 1446   K 4.1 04/12/2016 1113   CL 105 05/24/2016 1446   CO2 27 05/24/2016 1446   CO2 22 04/12/2016 1113   BUN 10 05/24/2016 1446   BUN 16.3 04/12/2016 1113   CREATININE 1.1 05/24/2016 1446   CREATININE 0.9 04/12/2016 1113      Component Value Date/Time   CALCIUM 9.2 05/24/2016  1446   CALCIUM 9.1 04/12/2016 1113   ALKPHOS 57 05/24/2016 1446   ALKPHOS 66 04/12/2016 1113   AST 31 05/24/2016 1446   AST 31 04/12/2016 1113   ALT 33 05/24/2016 1446   ALT 36 04/12/2016 1113   BILITOT 1.00 05/24/2016 1446   BILITOT 0.73 04/12/2016 1113         Impression and Plan: [name] [name] is 61 year old gentleman with polycythemia vera.  Thankfully, his blood count has come back down. I don't note the last reading was a "quirk". We did increase his Hydrea to 1000 mg a day. I will keep him on this for right now.  I will call in some vitamin B6. I am not sure if this will help with the fingers. It sounds like this is more of an anatomic issue.  He is iron deficient. This will be an ongoing issue. This might be causing some of the problems that he has. I don't think that we have to give him any iron.   Again, we will phlebotomize him today. I would like to see him back in one month so that we can monitor his blood counts closely.   His wife came in today. This actually the first time that we have seen her since we've been following [name] [name]. She just wanted to make sure everything was okay. I answered all of her questions. She felt much more reassured after our meeting.   [name], MD 1/18/20184:08 PM

## 2016-05-24 NOTE — Addendum Note (Signed)
Addended by: Burney Gauze R on: 05/24/2016 04:52 PM   Modules accepted: Orders

## 2016-05-24 NOTE — Progress Notes (Signed)
[name] [name] presents today for phlebotomy per MD orders. Phlebotomy procedure started at 1405 and ended at 1420, 550  grams removed via 16 G placed in R AC by Sheria Lang RN. Diet and nutrition provided.  Patient observed for 30 minutes after procedure without any incident. Patient tolerated procedure well.

## 2016-05-24 NOTE — Progress Notes (Signed)
[name] [name] presents today for phlebotomy per MD orders. Phlebotomy procedure started at 1620 and ended at 1630. 550 grams removed. Patient observed for 30 minutes after procedure without any incident. Patient tolerated procedure well. IV needle removed intact.

## 2016-05-24 NOTE — Patient Instructions (Signed)
Therapeutic Phlebotomy Therapeutic phlebotomy is the controlled removal of blood from a person's body for the purpose of treating a medical condition. The procedure is similar to donating blood. Usually, about a pint (470 mL, or 0.47L) of blood is removed. The average adult has 9-12 pints (4.3-5.7 L) of blood. Therapeutic phlebotomy may be used to treat the following medical conditions:  Hemochromatosis. This is a condition in which the blood contains too much iron.  Polycythemia vera. This is a condition in which the blood contains too many red blood cells.  Porphyria cutanea tarda. This is a disease in which an important part of hemoglobin is not made properly. It results in the buildup of abnormal amounts of porphyrins in the body.  Sickle cell disease. This is a condition in which the red blood cells form an abnormal crescent shape rather than a round shape. Tell a health care provider about:  Any allergies you have.  All medicines you are taking, including vitamins, herbs, eye drops, creams, and over-the-counter medicines.  Any problems you or family members have had with anesthetic medicines.  Any blood disorders you have.  Any surgeries you have had.  Any medical conditions you have. What are the risks? Generally, this is a safe procedure. However, problems may occur, including:  Nausea or light-headedness.  Low blood pressure.  Soreness, bleeding, swelling, or bruising at the needle insertion site.  Infection. What happens before the procedure?  Follow instructions from your health care provider about eating or drinking restrictions.  Ask your health care provider about changing or stopping your regular medicines. This is especially important if you are taking diabetes medicines or blood thinners.  Wear clothing with sleeves that can be raised above the elbow.  Plan to have someone take you home after the procedure.  You may have a blood sample taken. What happens  during the procedure?  A needle will be inserted into one of your veins.  Tubing and a collection bag will be attached to that needle.  Blood will flow through the needle and tubing into the collection bag.  You may be asked to open and close your hand slowly and continually during the entire collection.  After the specified amount of blood has been removed from your body, the collection bag and tubing will be clamped.  The needle will be removed from your vein.  Pressure will be held on the site of the needle insertion to stop the bleeding.  A bandage (dressing) will be placed over the needle insertion site. The procedure may vary among health care providers and hospitals. What happens after the procedure?  Your recovery will be assessed and monitored.  You can return to your normal activities as directed by your health care provider. This information is not intended to replace advice given to you by your health care provider. Make sure you discuss any questions you have with your health care provider. Document Released: 09/25/2010 Document Revised: 12/24/2015 Document Reviewed: 04/19/2014 Elsevier Interactive Patient Education  2017 [employer]  

## 2016-05-25 LAB — IRON AND TIBC
%SAT: 10 % — ABNORMAL LOW (ref 20–55)
Iron: 44 ug/dL (ref 42–163)
TIBC: 458 ug/dL — ABNORMAL HIGH (ref 202–409)
UIBC: 414 ug/dL — ABNORMAL HIGH (ref 117–376)

## 2016-05-25 LAB — TSH: TSH: 2.308 m(IU)/L (ref 0.320–4.118)

## 2016-05-25 LAB — FERRITIN: Ferritin: 5 ng/ml — ABNORMAL LOW (ref 22–316)

## 2016-05-26 LAB — TESTOSTERONE: Testosterone, Serum: 296 ng/dL (ref 264–916)

## 2016-06-28 ENCOUNTER — Other Ambulatory Visit (HOSPITAL_BASED_OUTPATIENT_CLINIC_OR_DEPARTMENT_OTHER): Payer: Managed Care, Other (non HMO)

## 2016-06-28 ENCOUNTER — Ambulatory Visit (HOSPITAL_BASED_OUTPATIENT_CLINIC_OR_DEPARTMENT_OTHER): Payer: Managed Care, Other (non HMO) | Admitting: Hematology & Oncology

## 2016-06-28 ENCOUNTER — Ambulatory Visit (HOSPITAL_BASED_OUTPATIENT_CLINIC_OR_DEPARTMENT_OTHER): Payer: Managed Care, Other (non HMO)

## 2016-06-28 VITALS — BP 129/80 | HR 69 | Temp 98.0°F | Wt 203.4 lb

## 2016-06-28 VITALS — BP 135/85 | HR 67 | Resp 18

## 2016-06-28 DIAGNOSIS — D751 Secondary polycythemia: Secondary | ICD-10-CM

## 2016-06-28 DIAGNOSIS — D45 Polycythemia vera: Secondary | ICD-10-CM

## 2016-06-28 DIAGNOSIS — E8809 Other disorders of plasma-protein metabolism, not elsewhere classified: Secondary | ICD-10-CM

## 2016-06-28 DIAGNOSIS — G63 Polyneuropathy in diseases classified elsewhere: Secondary | ICD-10-CM

## 2016-06-28 LAB — CBC WITH DIFFERENTIAL (CANCER CENTER ONLY)
BASO#: 0.3 10*3/uL — ABNORMAL HIGH (ref 0.0–0.2)
BASO%: 3 % — ABNORMAL HIGH (ref 0.0–2.0)
EOS%: 4.4 % (ref 0.0–7.0)
Eosinophils Absolute: 0.5 10*3/uL (ref 0.0–0.5)
HCT: 46.3 % (ref 38.7–49.9)
HGB: 14.7 g/dL (ref 13.0–17.1)
LYMPH#: 1.4 10*3/uL (ref 0.9–3.3)
LYMPH%: 12.9 % — ABNORMAL LOW (ref 14.0–48.0)
MCH: 24.2 pg — ABNORMAL LOW (ref 28.0–33.4)
MCHC: 31.7 g/dL — ABNORMAL LOW (ref 32.0–35.9)
MCV: 76 fL — ABNORMAL LOW (ref 82–98)
MONO#: 0.6 10*3/uL (ref 0.1–0.9)
MONO%: 5.6 % (ref 0.0–13.0)
NEUT#: 8.3 10*3/uL — ABNORMAL HIGH (ref 1.5–6.5)
NEUT%: 74.1 % (ref 40.0–80.0)
Platelets: 655 10*3/uL — ABNORMAL HIGH (ref 145–400)
RBC: 6.07 10*6/uL — ABNORMAL HIGH (ref 4.20–5.70)
RDW: 18.5 % — ABNORMAL HIGH (ref 11.1–15.7)
WBC: 11.2 10*3/uL — ABNORMAL HIGH (ref 4.0–10.0)

## 2016-06-28 LAB — COMPREHENSIVE METABOLIC PANEL (CC13)
ALT: 32 IU/L (ref 0–44)
AST (SGOT): 32 IU/L (ref 0–40)
Albumin, Serum: 4.9 g/dL (ref 3.5–5.5)
Albumin/Globulin Ratio: 2.6 — ABNORMAL HIGH (ref 1.2–2.2)
Alkaline Phosphatase, S: 59 IU/L (ref 39–117)
BUN/Creatinine Ratio: 15 (ref 9–20)
BUN: 13 mg/dL (ref 6–24)
Bilirubin Total: 0.6 mg/dL (ref 0.0–1.2)
Calcium, Ser: 9.7 mg/dL (ref 8.7–10.2)
Carbon Dioxide, Total: 26 mmol/L (ref 18–29)
Chloride, Ser: 103 mmol/L (ref 96–106)
Creatinine, Ser: 0.89 mg/dL (ref 0.76–1.27)
GFR calc Af Amer: 109 (ref >59)
GFR calc non Af Amer: 94 (ref >59)
Globulin, Total: 1.9 (ref 1.5–4.5)
Glucose: 106 mg/dL — ABNORMAL HIGH (ref 65–99)
Potassium, Ser: 4.1 mmol/L (ref 3.5–5.2)
Sodium: 136 mmol/L (ref 134–144)
Total Protein: 6.8 g/dL (ref 6.0–8.5)

## 2016-06-28 NOTE — Progress Notes (Signed)
Hematology and Oncology Follow Up Visit  [name] [name] [id number] June 10, 1955 61 y.o. 06/28/2016   Principle Diagnosis:  Polycythemia vera- JAK2 (+)  Current Therapy:    Phlebotomy to maintain hematocrit below 45%  Hydrea 1000 mg by mouth daily  Aspirin 81 mg by mouth daily     Interim History:  [name] [name] is back for followup. He doesn't feel as well. He feels more tired. He feels flushed.  I have a feeling that his polycythemia might be more of an issue.  He is working. He's had no problems with work. He is had no headache. He's had no nausea or vomiting. He's had no cough. He's had no change in bowel or bladder habits. He's not noted any rashes.   We last saw him, his ferritin was 5 with an iron saturation of 10%.  His LDH was up a little bit at 346.    Overall, his performance status is ECOG 1.   Medications:  Current Outpatient Prescriptions:  .  acetaminophen (TYLENOL) 500 MG tablet, Take 1,000 mg by mouth every 6 (six) hours as needed. For pain , Disp: , Rfl:  .  aspirin 81 MG tablet, Take 81 mg by mouth daily.  , Disp: , Rfl:  .  buPROPion (WELLBUTRIN SR) 150 MG 12 hr tablet, Take 150 mg by mouth every morning., Disp: , Rfl: 12 .  clidinium-chlordiazePOXIDE (LIBRAX) 5-2.5 MG capsule, TAKE 1 CAPSULE BY MOUTH AS NEEDED every 12 hrs, Disp: 60 capsule, Rfl: 2 .  doxycycline (PERIOSTAT) 20 MG tablet, Take 20 mg by mouth 2 (two) times daily., Disp: , Rfl: 11 .  gabapentin (NEURONTIN) 300 MG capsule, Take 300 mg by mouth every morning., Disp: , Rfl:  .  hydroxyurea (HYDREA) 500 MG capsule, TAKE 2 CAPSULES BY MOUTH DAILY. MAY TAKE WITH FOOD TO MINIMIZE GI SIDE EFFECTS, Disp: 60 capsule, Rfl: 5 .  hyoscyamine (LEVBID) 0.375 MG 12 hr tablet, Take 1 tablet (0.375 mg total) by mouth as needed., Disp: 60 tablet, Rfl: 4 .  propranolol ER (INDERAL LA) 120 MG 24 hr capsule, TAKE ONE CAPSULE BY MOUTH EVERY DAY, Disp: 30 capsule, Rfl: 4 .  Pyridoxine HCl (VITAMIN B-6) 250 MG tablet,  Take 1 tablet (250 mg total) by mouth daily., Disp: 30 tablet, Rfl: 12 .  zolpidem (AMBIEN CR) 12.5 MG CR tablet, Take by mouth as needed. For sleep pt usually takes 1/2 tablet, Disp: , Rfl:   Allergies: No Known Allergies  Past Medical History, Surgical history, Social history, and Family History were reviewed and updated.  Review of Systems: As above  Physical Exam:  weight is 203 lb 6.4 oz (92.3 kg). His oral temperature is 98 F (36.7 C). His blood pressure is 129/80 and his pulse is 69.   Well-developed and well-nourished white gentleman. Head and neck exam shows no ocular or oral lesions. He does have the surgical incision on the lower eyelid and on the right eye. He has no scleral icterus. He has no adenopathy in the neck. Lungs are clear. Cardiac exam regular rate and rhythm with no murmurs, rubs or bruits. Abdomen is soft. He has good bowel sounds. There is no fluid wave. There is no palpable liver or spleen tip. Extremities shows no clubbing, cyanosis or edema. No palpable venous cord is noted. Skin exam no rashes, ecchymosis or petechia. Neurological exam shows no focal neurological deficits.  Lab Results  Component Value Date   WBC 11.2 (H) 06/28/2016   HGB 14.7 06/28/2016  HCT 46.3 06/28/2016   MCV 76 (L) 06/28/2016   PLT 655 (H) 06/28/2016     Chemistry      Component Value Date/Time   NA 137 05/24/2016 1446   NA 139 04/12/2016 1113   K 3.8 05/24/2016 1446   K 4.1 04/12/2016 1113   CL 105 05/24/2016 1446   CO2 27 05/24/2016 1446   CO2 22 04/12/2016 1113   BUN 10 05/24/2016 1446   BUN 16.3 04/12/2016 1113   CREATININE 1.1 05/24/2016 1446   CREATININE 0.9 04/12/2016 1113      Component Value Date/Time   CALCIUM 9.2 05/24/2016 1446   CALCIUM 9.1 04/12/2016 1113   ALKPHOS 57 05/24/2016 1446   ALKPHOS 66 04/12/2016 1113   AST 31 05/24/2016 1446   AST 31 04/12/2016 1113   ALT 33 05/24/2016 1446   ALT 36 04/12/2016 1113   BILITOT 1.00 05/24/2016 1446   BILITOT  0.73 04/12/2016 1113         Impression and Plan: [name] [name] is 61 year old gentleman with polycythemia vera.  Unfortunately, his platelet count is back up. I really think that we are at a point now that we have to consider making a change in his therapy. I think we have to consider JAKAFI for him.  Since we are going to make a change in his therapy, I think we will need a bone marrow test on him. I'm not sure if he has had a bone marrow test. If he has had one, it has been many years.  I spent about 30 minutes with him today. I explained him why thought we had to make a change. I just don't want to go up on his Hydrea dose. He is JAK2 positive. I think JAKAFI woodwork nicely.  I explained how a bone marrow test is done. We will have to have radiology do this since short stay at [facility] is no longer doing bone marrow tests.  We'll try to get things set up in the next 2 or 3 weeks. I think he wants to talk to was wife first.  We will keep him on Hydrea for right now. I'm not going to change his dose.  I would like to try to see him back here in one month.   [name] Napoleon, MD 2/22/20184:10 PMit

## 2016-06-28 NOTE — Progress Notes (Signed)
[name] [name] presents today for phlebotomy per MD orders. Phlebotomy procedure started at 1525 and ended at 1600. 450 grams removed using 16g phlebotomy kit Patient observed for 30 minutes after procedure without any incident. Patient tolerated procedure well. IV needle removed intact.

## 2016-06-28 NOTE — Patient Instructions (Signed)
Therapeutic Phlebotomy Therapeutic phlebotomy is the controlled removal of blood from a person's body for the purpose of treating a medical condition. The procedure is similar to donating blood. Usually, about a pint (470 mL, or 0.47L) of blood is removed. The average adult has 9-12 pints (4.3-5.7 L) of blood. Therapeutic phlebotomy may be used to treat the following medical conditions:  Hemochromatosis. This is a condition in which the blood contains too much iron.  Polycythemia vera. This is a condition in which the blood contains too many red blood cells.  Porphyria cutanea tarda. This is a disease in which an important part of hemoglobin is not made properly. It results in the buildup of abnormal amounts of porphyrins in the body.  Sickle cell disease. This is a condition in which the red blood cells form an abnormal crescent shape rather than a round shape. Tell a health care provider about:  Any allergies you have.  All medicines you are taking, including vitamins, herbs, eye drops, creams, and over-the-counter medicines.  Any problems you or family members have had with anesthetic medicines.  Any blood disorders you have.  Any surgeries you have had.  Any medical conditions you have. What are the risks? Generally, this is a safe procedure. However, problems may occur, including:  Nausea or light-headedness.  Low blood pressure.  Soreness, bleeding, swelling, or bruising at the needle insertion site.  Infection. What happens before the procedure?  Follow instructions from your health care provider about eating or drinking restrictions.  Ask your health care provider about changing or stopping your regular medicines. This is especially important if you are taking diabetes medicines or blood thinners.  Wear clothing with sleeves that can be raised above the elbow.  Plan to have someone take you home after the procedure.  You may have a blood sample taken. What happens  during the procedure?  A needle will be inserted into one of your veins.  Tubing and a collection bag will be attached to that needle.  Blood will flow through the needle and tubing into the collection bag.  You may be asked to open and close your hand slowly and continually during the entire collection.  After the specified amount of blood has been removed from your body, the collection bag and tubing will be clamped.  The needle will be removed from your vein.  Pressure will be held on the site of the needle insertion to stop the bleeding.  A bandage (dressing) will be placed over the needle insertion site. The procedure may vary among health care providers and hospitals. What happens after the procedure?  Your recovery will be assessed and monitored.  You can return to your normal activities as directed by your health care provider. This information is not intended to replace advice given to you by your health care provider. Make sure you discuss any questions you have with your health care provider. Document Released: 09/25/2010 Document Revised: 12/24/2015 Document Reviewed: 04/19/2014 Elsevier Interactive Patient Education  2017 [employer]  

## 2016-06-29 LAB — IRON AND TIBC
%SAT: 7 % — ABNORMAL LOW (ref 20–55)
Iron: 34 ug/dL — ABNORMAL LOW (ref 42–163)
TIBC: 488 ug/dL — ABNORMAL HIGH (ref 202–409)
UIBC: 454 ug/dL — ABNORMAL HIGH (ref 117–376)

## 2016-06-29 LAB — LACTATE DEHYDROGENASE: LDH: 315 U/L — ABNORMAL HIGH (ref 125–245)

## 2016-06-29 LAB — FERRITIN: Ferritin: 6 ng/ml — ABNORMAL LOW (ref 22–316)

## 2016-07-25 ENCOUNTER — Ambulatory Visit: Payer: Managed Care, Other (non HMO) | Admitting: Hematology & Oncology

## 2016-07-25 ENCOUNTER — Other Ambulatory Visit: Payer: Managed Care, Other (non HMO)

## 2016-08-02 ENCOUNTER — Ambulatory Visit (HOSPITAL_BASED_OUTPATIENT_CLINIC_OR_DEPARTMENT_OTHER): Payer: Managed Care, Other (non HMO) | Admitting: Hematology & Oncology

## 2016-08-02 ENCOUNTER — Other Ambulatory Visit (HOSPITAL_BASED_OUTPATIENT_CLINIC_OR_DEPARTMENT_OTHER): Payer: Managed Care, Other (non HMO)

## 2016-08-02 VITALS — BP 136/80 | HR 70 | Temp 98.4°F | Resp 17 | Wt 208.0 lb

## 2016-08-02 DIAGNOSIS — N451 Epididymitis: Secondary | ICD-10-CM | POA: Diagnosis not present

## 2016-08-02 DIAGNOSIS — D45 Polycythemia vera: Secondary | ICD-10-CM

## 2016-08-02 DIAGNOSIS — N5089 Other specified disorders of the male genital organs: Secondary | ICD-10-CM

## 2016-08-02 DIAGNOSIS — D751 Secondary polycythemia: Secondary | ICD-10-CM

## 2016-08-02 LAB — COMPREHENSIVE METABOLIC PANEL (CC13)
ALT: 31 IU/L (ref 0–44)
AST (SGOT): 31 IU/L (ref 0–40)
Albumin, Serum: 4.5 g/dL (ref 3.5–5.5)
Albumin/Globulin Ratio: 2.3 — ABNORMAL HIGH (ref 1.2–2.2)
Alkaline Phosphatase, S: 64 IU/L (ref 39–117)
BUN/Creatinine Ratio: 13 (ref 9–20)
BUN: 12 mg/dL (ref 6–24)
Bilirubin Total: 0.6 mg/dL (ref 0.0–1.2)
Calcium, Ser: 8.9 mg/dL (ref 8.7–10.2)
Carbon Dioxide, Total: 27 mmol/L (ref 18–29)
Chloride, Ser: 103 mmol/L (ref 96–106)
Creatinine, Ser: 0.96 mg/dL (ref 0.76–1.27)
GFR calc Af Amer: 100 mL/min/{1.73_m2} (ref >59)
GFR calc non Af Amer: 87 mL/min/{1.73_m2} (ref >59)
Globulin, Total: 2 g/dL (ref 1.5–4.5)
Glucose: 107 mg/dL — ABNORMAL HIGH (ref 65–99)
Potassium, Ser: 3.7 mmol/L (ref 3.5–5.2)
Sodium: 138 mmol/L (ref 134–144)
Total Protein: 6.5 g/dL (ref 6.0–8.5)

## 2016-08-02 LAB — CBC WITH DIFFERENTIAL (CANCER CENTER ONLY)
BASO#: 0.3 10*3/uL — ABNORMAL HIGH (ref 0.0–0.2)
BASO%: 4.2 % — ABNORMAL HIGH (ref 0.0–2.0)
EOS%: 3.4 % (ref 0.0–7.0)
Eosinophils Absolute: 0.3 10*3/uL (ref 0.0–0.5)
HCT: 43.1 % (ref 38.7–49.9)
HGB: 13.9 g/dL (ref 13.0–17.1)
LYMPH#: 1.2 10*3/uL (ref 0.9–3.3)
LYMPH%: 14.3 % (ref 14.0–48.0)
MCH: 25.5 pg — ABNORMAL LOW (ref 28.0–33.4)
MCHC: 32.3 g/dL (ref 32.0–35.9)
MCV: 79 fL — ABNORMAL LOW (ref 82–98)
MONO#: 0.8 10*3/uL (ref 0.1–0.9)
MONO%: 9.2 % (ref 0.0–13.0)
NEUT#: 5.6 10*3/uL (ref 1.5–6.5)
NEUT%: 68.9 % (ref 40.0–80.0)
Platelets: 229 10*3/uL (ref 145–400)
RBC: 5.46 10*6/uL (ref 4.20–5.70)
RDW: 21.4 % — ABNORMAL HIGH (ref 11.1–15.7)
WBC: 8.1 10*3/uL (ref 4.0–10.0)

## 2016-08-02 LAB — TECHNOLOGIST REVIEW CHCC SATELLITE

## 2016-08-02 LAB — CHCC SATELLITE - SMEAR

## 2016-08-02 NOTE — Progress Notes (Addendum)
Hematology and Oncology Follow Up Visit  [name] [id number] December 03, 1955 61 y.o. 08/02/2016   Principle Diagnosis:  Polycythemia vera- JAK2 (+)  Current Therapy:    Phlebotomy to maintain hematocrit below 45%  Hydrea 1000 mg by mouth daily  Aspirin 81 mg by mouth daily     Interim History:  Mr.  Ala is back for followup. He is feeling better. He does have some pain and swelling in the right scrotal region. This is begun on for a couple weeks. He denies any trauma. There's been no problems with bowels or bladder. He's had no fever. He's had no nausea or vomiting.  We probably will have to do an ultrasound of the scrotal sac.  He, otherwise, seems to be feeling a little bit better. We saw him last time, there was some concern about his polycythemia coming more "active". I thought maybe we'll have to switch him over to [place].  He's had no cough. He's had no rashes. There's been no leg swelling.  He's had no headache.  Overall, his performance status is ECOG 0.   Medications:  Current Outpatient Prescriptions:  .  acetaminophen (TYLENOL) 500 MG tablet, Take 1,000 mg by mouth every 6 (six) hours as needed. For pain , Disp: , Rfl:  .  aspirin 81 MG tablet, Take 81 mg by mouth daily.  , Disp: , Rfl:  .  buPROPion (WELLBUTRIN SR) 150 MG 12 hr tablet, Take 150 mg by mouth every morning., Disp: , Rfl: 12 .  clidinium-chlordiazePOXIDE (LIBRAX) 5-2.5 MG capsule, TAKE 1 CAPSULE BY MOUTH AS NEEDED every 12 hrs, Disp: 60 capsule, Rfl: 2 .  doxycycline (PERIOSTAT) 20 MG tablet, Take 20 mg by mouth 2 (two) times daily., Disp: , Rfl: 11 .  hydroxyurea (HYDREA) 500 MG capsule, TAKE 2 CAPSULES BY MOUTH DAILY. MAY TAKE WITH FOOD TO MINIMIZE GI SIDE EFFECTS, Disp: 60 capsule, Rfl: 5 .  hyoscyamine (LEVBID) 0.375 MG 12 hr tablet, Take 1 tablet (0.375 mg total) by mouth as needed., Disp: 60 tablet, Rfl: 4 .  propranolol ER (INDERAL LA) 120 MG 24 hr capsule, TAKE ONE CAPSULE BY MOUTH EVERY  DAY, Disp: 30 capsule, Rfl: 4 .  Pyridoxine HCl (VITAMIN B-6) 250 MG tablet, Take 1 tablet (250 mg total) by mouth daily., Disp: 30 tablet, Rfl: 12 .  zolpidem (AMBIEN CR) 12.5 MG CR tablet, Take by mouth as needed. For sleep pt usually takes 1/2 tablet, Disp: , Rfl:   Allergies: No Known Allergies  Past Medical History, Surgical history, Social history, and Family History were reviewed and updated.  Review of Systems: As above  Physical Exam:  weight is 208 lb (94.3 kg). His tympanic temperature is 98.4 F (36.9 C). His blood pressure is 136/80 and his pulse is 70. His respiration is 17 and oxygen saturation is 97%.   Well-developed and well-nourished white gentleman. Head and neck exam shows no ocular or oral lesions. He does have the surgical incision on the lower eyelid and on the right eye. He has no scleral icterus. He has no adenopathy in the neck. Lungs are clear. Cardiac exam regular rate and rhythm with no murmurs, rubs or bruits. Abdomen is soft. He has good bowel sounds. There is no fluid wave. There is no palpable liver or spleen tip. Extremities shows no clubbing, cyanosis or edema. No palpable venous cord is noted. Genital exam does show some swelling in the right scrotal region. There is some tenderness to palpation of the right testicle.  It is hard to discern if there is a mass. He may have a hernia. Skin exam no rashes, ecchymosis or petechia. Neurological exam shows no focal neurological deficits.  Lab Results  Component Value Date   WBC 8.1 08/02/2016   HGB 13.9 08/02/2016   HCT 43.1 08/02/2016   MCV 79 (L) 08/02/2016   PLT 229 08/02/2016     Chemistry      Component Value Date/Time   NA 136 06/28/2016 1425   NA 137 05/24/2016 1446   NA 139 04/12/2016 1113   K 4.1 06/28/2016 1425   K 3.8 05/24/2016 1446   K 4.1 04/12/2016 1113   CL 103 06/28/2016 1425   CL 105 05/24/2016 1446   CO2 26 06/28/2016 1425   CO2 27 05/24/2016 1446   CO2 22 04/12/2016 1113   BUN 13  06/28/2016 1425   BUN 10 05/24/2016 1446   BUN 16.3 04/12/2016 1113   CREATININE 0.89 06/28/2016 1425   CREATININE 1.1 05/24/2016 1446   CREATININE 0.9 04/12/2016 1113      Component Value Date/Time   CALCIUM 9.7 06/28/2016 1425   CALCIUM 9.2 05/24/2016 1446   CALCIUM 9.1 04/12/2016 1113   ALKPHOS 59 06/28/2016 1425   ALKPHOS 57 05/24/2016 1446   ALKPHOS 66 04/12/2016 1113   AST 32 06/28/2016 1425   AST 31 05/24/2016 1446   AST 31 04/12/2016 1113   ALT 32 06/28/2016 1425   ALT 33 05/24/2016 1446   ALT 36 04/12/2016 1113   BILITOT 0.6 06/28/2016 1425   BILITOT 1.00 05/24/2016 1446   BILITOT 0.73 04/12/2016 1113         Impression and Plan: [name] is 61 year old gentleman with polycythemia vera.for right now, he does not need be phlebotomized. I'm happy about that. However, I think we have to figure out what is going on with the right testicle. I [place] of this is an actual testicular mass or if he has a hernia.  We will set him up with a ultrasound of the scrotum.  I don't think we have to make any changes with the Hydrea.  I do we still have to follow him very closely area and I would like to see him back in one month.  If there is an issue with the right testicle, then we will get him to urology for follow-up.   I forgot to mention that his brother was hospitalized with anemia. He seems to be doing better.  [name], MD 3/29/20184:42 PMit   ADDENDUM:  We got a scrotal ultrasound on [name]. It looks like he has epididymitis of the right epididymis. There is a complex hypoechoic lesion with increased vascularity. It was felt that this may represent a focal abscess. Everything else looked okay. There were normal-appearing testicles.  I called him. I told him what I thought was going on. I will call in some Levaquin. He will take 500 mg daily for 10 days. I told him to try some nonsteroidals for any type of pain.  If after 10 days, the pain and  swelling are not better, then we will have to get him to his urologist.  He understands all this and is agreeable.

## 2016-08-03 ENCOUNTER — Ambulatory Visit (HOSPITAL_BASED_OUTPATIENT_CLINIC_OR_DEPARTMENT_OTHER)
Admission: RE | Admit: 2016-08-03 | Discharge: 2016-08-03 | Disposition: A | Discharge status: Home / Self Care | Payer: Managed Care, Other (non HMO) | Source: Ambulatory Visit | Attending: Hematology & Oncology | Admitting: Hematology & Oncology

## 2016-08-03 DIAGNOSIS — N5089 Other specified disorders of the male genital organs: Secondary | ICD-10-CM

## 2016-08-03 LAB — LACTATE DEHYDROGENASE: LDH: 302 U/L — ABNORMAL HIGH (ref 125–245)

## 2016-08-03 LAB — IRON AND TIBC
%SAT: 17 % — ABNORMAL LOW (ref 20–55)
Iron: 77 ug/dL (ref 42–163)
TIBC: 446 ug/dL — ABNORMAL HIGH (ref 202–409)
UIBC: 369 ug/dL (ref 117–376)

## 2016-08-03 LAB — FERRITIN: Ferritin: 13 ng/ml — ABNORMAL LOW (ref 22–316)

## 2016-08-03 MED ORDER — LEVOFLOXACIN 500 MG PO TABS: 500.0000 mg | ORAL_TABLET | Freq: Every day | ORAL | 0 refills | Status: DC

## 2016-08-03 NOTE — Addendum Note (Signed)
Addended by: Burney Gauze R on: 08/03/2016 04:55 PM   Modules accepted: Orders

## 2016-09-11 ENCOUNTER — Other Ambulatory Visit: Payer: Self-pay | Admitting: *Deleted

## 2016-09-11 DIAGNOSIS — D751 Secondary polycythemia: Secondary | ICD-10-CM

## 2016-09-12 ENCOUNTER — Other Ambulatory Visit (HOSPITAL_BASED_OUTPATIENT_CLINIC_OR_DEPARTMENT_OTHER): Payer: Managed Care, Other (non HMO)

## 2016-09-12 ENCOUNTER — Ambulatory Visit (HOSPITAL_BASED_OUTPATIENT_CLINIC_OR_DEPARTMENT_OTHER): Payer: Managed Care, Other (non HMO)

## 2016-09-12 ENCOUNTER — Ambulatory Visit (HOSPITAL_BASED_OUTPATIENT_CLINIC_OR_DEPARTMENT_OTHER): Payer: Managed Care, Other (non HMO) | Admitting: Hematology & Oncology

## 2016-09-12 VITALS — BP 141/88 | HR 68 | Resp 17

## 2016-09-12 VITALS — BP 137/81 | HR 65 | Temp 98.1°F | Resp 17 | Wt 207.0 lb

## 2016-09-12 DIAGNOSIS — D45 Polycythemia vera: Secondary | ICD-10-CM | POA: Diagnosis not present

## 2016-09-12 DIAGNOSIS — D751 Secondary polycythemia: Secondary | ICD-10-CM

## 2016-09-12 LAB — CBC WITH DIFFERENTIAL (CANCER CENTER ONLY)
BASO#: 0.2 10*3/uL (ref 0.0–0.2)
BASO%: 3.2 % — ABNORMAL HIGH (ref 0.0–2.0)
EOS%: 3.2 % (ref 0.0–7.0)
Eosinophils Absolute: 0.2 10*3/uL (ref 0.0–0.5)
HCT: 44.7 % (ref 38.7–49.9)
HGB: 14.7 g/dL (ref 13.0–17.1)
LYMPH#: 1.1 10*3/uL (ref 0.9–3.3)
LYMPH%: 16.2 % (ref 14.0–48.0)
MCH: 27.4 pg — ABNORMAL LOW (ref 28.0–33.4)
MCHC: 32.9 g/dL (ref 32.0–35.9)
MCV: 83 fL (ref 82–98)
MONO#: 0.6 10*3/uL (ref 0.1–0.9)
MONO%: 9.2 % (ref 0.0–13.0)
NEUT#: 4.4 10*3/uL (ref 1.5–6.5)
NEUT%: 68.2 % (ref 40.0–80.0)
Platelets: 324 10*3/uL (ref 145–400)
RBC: 5.37 10*6/uL (ref 4.20–5.70)
RDW: 21.4 % — ABNORMAL HIGH (ref 11.1–15.7)
WBC: 6.5 10*3/uL (ref 4.0–10.0)

## 2016-09-12 LAB — CHCC SATELLITE - SMEAR

## 2016-09-12 NOTE — Patient Instructions (Signed)
Therapeutic Phlebotomy Therapeutic phlebotomy is the controlled removal of blood from a person's body for the purpose of treating a medical condition. The procedure is similar to donating blood. Usually, about a pint (470 mL, or 0.47L) of blood is removed. The average adult has 9-12 pints (4.3-5.7 L) of blood. Therapeutic phlebotomy may be used to treat the following medical conditions:  Hemochromatosis. This is a condition in which the blood contains too much iron.  Polycythemia vera. This is a condition in which the blood contains too many red blood cells.  Porphyria cutanea tarda. This is a disease in which an important part of hemoglobin is not made properly. It results in the buildup of abnormal amounts of porphyrins in the body.  Sickle cell disease. This is a condition in which the red blood cells form an abnormal crescent shape rather than a round shape. Tell a health care provider about:  Any allergies you have.  All medicines you are taking, including vitamins, herbs, eye drops, creams, and over-the-counter medicines.  Any problems you or family members have had with anesthetic medicines.  Any blood disorders you have.  Any surgeries you have had.  Any medical conditions you have. What are the risks? Generally, this is a safe procedure. However, problems may occur, including:  Nausea or light-headedness.  Low blood pressure.  Soreness, bleeding, swelling, or bruising at the needle insertion site.  Infection. What happens before the procedure?  Follow instructions from your health care provider about eating or drinking restrictions.  Ask your health care provider about changing or stopping your regular medicines. This is especially important if you are taking diabetes medicines or blood thinners.  Wear clothing with sleeves that can be raised above the elbow.  Plan to have someone take you home after the procedure.  You may have a blood sample taken. What happens  during the procedure?  A needle will be inserted into one of your veins.  Tubing and a collection bag will be attached to that needle.  Blood will flow through the needle and tubing into the collection bag.  You may be asked to open and close your hand slowly and continually during the entire collection.  After the specified amount of blood has been removed from your body, the collection bag and tubing will be clamped.  The needle will be removed from your vein.  Pressure will be held on the site of the needle insertion to stop the bleeding.  A bandage (dressing) will be placed over the needle insertion site. The procedure may vary among health care providers and hospitals. What happens after the procedure?  Your recovery will be assessed and monitored.  You can return to your normal activities as directed by your health care provider. This information is not intended to replace advice given to you by your health care provider. Make sure you discuss any questions you have with your health care provider. Document Released: 09/25/2010 Document Revised: 12/24/2015 Document Reviewed: 04/19/2014 Elsevier Interactive Patient Education  2017 [employer]  

## 2016-09-12 NOTE — Progress Notes (Signed)
Hematology and Oncology Follow Up Visit  [name] [name] [id number] 1955-05-09 61 y.o. 09/12/2016   Principle Diagnosis:  Polycythemia vera- JAK2 (+)  Current Therapy:    Phlebotomy to maintain hematocrit below 45%  Hydrea 1000 mg by mouth daily  Aspirin 81 mg by mouth daily     Interim History:  [name] is back for followup. He is feeling better.he had an episode of epididymitis. This resolved with Levaquin.  He is doing well otherwise. He's had no fatigue or weakness. He's had no rash. He's had no cough. He's had no headache.  He's had no issues with leg swelling. He's had no obvious change in bowel or bladder habits.   We last saw him in late March, his ferritin was 13 with an iron saturation of 17%.  Overall, his performance status is ECOG 0.    Medications:  Current Outpatient Prescriptions:  .  acetaminophen (TYLENOL) 500 MG tablet, Take 1,000 mg by mouth every 6 (six) hours as needed. For pain , Disp: , Rfl:  .  aspirin 81 MG tablet, Take 81 mg by mouth daily.  , Disp: , Rfl:  .  buPROPion (WELLBUTRIN SR) 150 MG 12 hr tablet, Take 150 mg by mouth every morning., Disp: , Rfl: 12 .  clidinium-chlordiazePOXIDE (LIBRAX) 5-2.5 MG capsule, TAKE 1 CAPSULE BY MOUTH AS NEEDED every 12 hrs, Disp: 60 capsule, Rfl: 2 .  doxycycline (PERIOSTAT) 20 MG tablet, Take 20 mg by mouth 2 (two) times daily., Disp: , Rfl: 11 .  hydroxyurea (HYDREA) 500 MG capsule, TAKE 2 CAPSULES BY MOUTH DAILY. MAY TAKE WITH FOOD TO MINIMIZE GI SIDE EFFECTS, Disp: 60 capsule, Rfl: 5 .  hyoscyamine (LEVBID) 0.375 MG 12 hr tablet, Take 1 tablet (0.375 mg total) by mouth as needed., Disp: 60 tablet, Rfl: 4 .  propranolol ER (INDERAL LA) 120 MG 24 hr capsule, TAKE ONE CAPSULE BY MOUTH EVERY DAY, Disp: 30 capsule, Rfl: 4 .  zolpidem (AMBIEN CR) 12.5 MG CR tablet, Take by mouth as needed. For sleep pt usually takes 1/2 tablet, Disp: , Rfl:   Allergies: No Known Allergies  Past Medical History, Surgical  history, Social history, and Family History were reviewed and updated.  Review of Systems: As above  Physical Exam:  weight is 207 lb (93.9 kg). His oral temperature is 98.1 F (36.7 C). His blood pressure is 137/81 and his pulse is 65. His respiration is 17 and oxygen saturation is 97%.   Well-developed and well-nourished white gentleman. Head and neck exam shows no ocular or oral lesions. He does have the surgical incision on the lower eyelid and on the right eye. He has no scleral icterus. He has no adenopathy in the neck. Lungs are clear. Cardiac exam regular rate and rhythm with no murmurs, rubs or bruits. Abdomen is soft. He has good bowel sounds. There is no fluid wave. There is no palpable liver or spleen tip. Extremities shows no clubbing, cyanosis or edema. No palpable venous cord is noted. Genital exam does show some swelling in the right scrotal region. There is some tenderness to palpation of the right testicle. It is hard to discern if there is a mass. He may have a hernia. Skin exam no rashes, ecchymosis or petechia. Neurological exam shows no focal neurological deficits.  Lab Results  Component Value Date   WBC 6.5 09/12/2016   HGB 14.7 09/12/2016   HCT 44.7 09/12/2016   MCV 83 09/12/2016   PLT 324 09/12/2016  Chemistry      Component Value Date/Time   NA 138 08/02/2016 1503   NA 137 05/24/2016 1446   NA 139 04/12/2016 1113   K 3.7 08/02/2016 1503   K 3.8 05/24/2016 1446   K 4.1 04/12/2016 1113   CL 103 08/02/2016 1503   CL 105 05/24/2016 1446   CO2 27 08/02/2016 1503   CO2 27 05/24/2016 1446   CO2 22 04/12/2016 1113   BUN 12 08/02/2016 1503   BUN 10 05/24/2016 1446   BUN 16.3 04/12/2016 1113   CREATININE 0.96 08/02/2016 1503   CREATININE 1.1 05/24/2016 1446   CREATININE 0.9 04/12/2016 1113      Component Value Date/Time   CALCIUM 8.9 08/02/2016 1503   CALCIUM 9.2 05/24/2016 1446   CALCIUM 9.1 04/12/2016 1113   ALKPHOS 64 08/02/2016 1503   ALKPHOS 57  05/24/2016 1446   ALKPHOS 66 04/12/2016 1113   AST 31 08/02/2016 1503   AST 31 05/24/2016 1446   AST 31 04/12/2016 1113   ALT 31 08/02/2016 1503   ALT 33 05/24/2016 1446   ALT 36 04/12/2016 1113   BILITOT 0.6 08/02/2016 1503   BILITOT 1.00 05/24/2016 1446   BILITOT 0.73 04/12/2016 1113         Impression and Plan: [name] [name] is 61 year old gentleman with polycythemia vera.  We will go ahead and phlebotomize him. He is close enough to 45% with his hematocrit that I think a phlebotomy would be helpful.  I forgot to mention that there are still issues with his younger brother. He is mentally challenged. He is in a rehabilitation center right now. He the fell and broke a leg or hip.  We will plan to get [name] [name] back in 2 months. I think with his phlebotomy, weak and get him back in 2 months.   [name], MD 5/9/20184:07 PMit   .

## 2016-09-12 NOTE — Progress Notes (Signed)
[name] [name] presents today for phlebotomy per MD orders. Phlebotomy procedure started at 1420 and ended at 1438 with 530 grams removed via 16 G to L AC. Patient observed for 30 minutes after procedure without any incident.  Diet and nutrition offered. Patient tolerated procedure well.

## 2016-09-13 LAB — COMPREHENSIVE METABOLIC PANEL (CC13)
ALT: 33 IU/L (ref 0–44)
AST (SGOT): 29 IU/L (ref 0–40)
Albumin, Serum: 4.6 g/dL (ref 3.5–5.5)
Albumin/Globulin Ratio: 2.6 — ABNORMAL HIGH (ref 1.2–2.2)
Alkaline Phosphatase, S: 62 IU/L (ref 39–117)
BUN/Creatinine Ratio: 13 (ref 9–20)
BUN: 12 mg/dL (ref 6–24)
Bilirubin Total: 0.9 mg/dL (ref 0.0–1.2)
Calcium, Ser: 9.4 mg/dL (ref 8.7–10.2)
Carbon Dioxide, Total: 26 mmol/L (ref 18–29)
Chloride, Ser: 101 mmol/L (ref 96–106)
Creatinine, Ser: 0.93 mg/dL (ref 0.76–1.27)
GFR calc Af Amer: 104 mL/min/{1.73_m2} (ref >59)
GFR calc non Af Amer: 90 mL/min/{1.73_m2} (ref >59)
Globulin, Total: 1.8 g/dL (ref 1.5–4.5)
Glucose: 112 mg/dL — ABNORMAL HIGH (ref 65–99)
Potassium, Ser: 3.8 mmol/L (ref 3.5–5.2)
Sodium: 132 mmol/L — ABNORMAL LOW (ref 134–144)
Total Protein: 6.4 g/dL (ref 6.0–8.5)

## 2016-09-13 LAB — IRON AND TIBC
%SAT: 12 % — ABNORMAL LOW (ref 20–55)
Iron: 57 ug/dL (ref 42–163)
TIBC: 493 ug/dL — ABNORMAL HIGH (ref 202–409)
UIBC: 435 ug/dL — ABNORMAL HIGH (ref 117–376)

## 2016-09-13 LAB — FERRITIN: Ferritin: 7 ng/ml — ABNORMAL LOW (ref 22–316)

## 2016-09-13 LAB — LACTATE DEHYDROGENASE: LDH: 240 U/L (ref 125–245)

## 2016-10-10 ENCOUNTER — Other Ambulatory Visit: Payer: Self-pay | Admitting: Hematology & Oncology

## 2016-12-05 ENCOUNTER — Other Ambulatory Visit (HOSPITAL_BASED_OUTPATIENT_CLINIC_OR_DEPARTMENT_OTHER): Payer: Managed Care, Other (non HMO)

## 2016-12-05 ENCOUNTER — Ambulatory Visit (HOSPITAL_BASED_OUTPATIENT_CLINIC_OR_DEPARTMENT_OTHER): Payer: Managed Care, Other (non HMO)

## 2016-12-05 ENCOUNTER — Ambulatory Visit (HOSPITAL_BASED_OUTPATIENT_CLINIC_OR_DEPARTMENT_OTHER): Payer: Managed Care, Other (non HMO) | Admitting: Hematology & Oncology

## 2016-12-05 VITALS — BP 134/84 | HR 60 | Temp 98.0°F | Resp 20

## 2016-12-05 VITALS — BP 134/85 | HR 66 | Temp 98.6°F | Resp 17 | Wt 205.0 lb

## 2016-12-05 DIAGNOSIS — D751 Secondary polycythemia: Secondary | ICD-10-CM

## 2016-12-05 DIAGNOSIS — D45 Polycythemia vera: Secondary | ICD-10-CM | POA: Diagnosis not present

## 2016-12-05 LAB — CBC WITH DIFFERENTIAL (CANCER CENTER ONLY)
BASO#: 0.3 10*3/uL — ABNORMAL HIGH (ref 0.0–0.2)
BASO%: 2.7 % — ABNORMAL HIGH (ref 0.0–2.0)
EOS%: 3.5 % (ref 0.0–7.0)
Eosinophils Absolute: 0.4 10*3/uL (ref 0.0–0.5)
HCT: 46.6 % (ref 38.7–49.9)
HGB: 14.7 g/dL (ref 13.0–17.1)
LYMPH#: 1.1 10*3/uL (ref 0.9–3.3)
LYMPH%: 10 % — ABNORMAL LOW (ref 14.0–48.0)
MCH: 26 pg — ABNORMAL LOW (ref 28.0–33.4)
MCHC: 31.5 g/dL — ABNORMAL LOW (ref 32.0–35.9)
MCV: 83 fL (ref 82–98)
MONO#: 0.7 10*3/uL (ref 0.1–0.9)
MONO%: 6.5 % (ref 0.0–13.0)
NEUT#: 8.8 10*3/uL — ABNORMAL HIGH (ref 1.5–6.5)
NEUT%: 77.3 % (ref 40.0–80.0)
Platelets: 668 10*3/uL — ABNORMAL HIGH (ref 145–400)
RBC: 5.65 10*6/uL (ref 4.20–5.70)
RDW: 16.6 % — ABNORMAL HIGH (ref 11.1–15.7)
WBC: 11.4 10*3/uL — ABNORMAL HIGH (ref 4.0–10.0)

## 2016-12-05 LAB — CMP (CANCER CENTER ONLY)
ALT(SGPT): 35 U/L (ref 10–47)
AST: 40 U/L — ABNORMAL HIGH (ref 11–38)
Albumin: 4.1 g/dL (ref 3.3–5.5)
Alkaline Phosphatase: 54 U/L (ref 26–84)
BUN, Bld: 10 mg/dL (ref 7–22)
CO2: 30 mEq/L (ref 18–33)
Calcium: 9.2 mg/dL (ref 8.0–10.3)
Chloride: 104 mEq/L (ref 98–108)
Creat: 0.9 mg/dl (ref 0.6–1.2)
Glucose, Bld: 101 mg/dL (ref 73–118)
Potassium: 4 mEq/L (ref 3.3–4.7)
Sodium: 141 mEq/L (ref 128–145)
Total Bilirubin: 0.9 mg/dl (ref 0.20–1.60)
Total Protein: 6.7 g/dL (ref 6.4–8.1)

## 2016-12-05 NOTE — Progress Notes (Signed)
[name] [name] presents today for phlebotomy per MD orders. Phlebotomy procedure started at 1600 and ended at 1610. 500 grams removed. Patient observed for 30 minutes after procedure without any incident. Patient tolerated procedure well. IV needle removed intact.

## 2016-12-05 NOTE — Progress Notes (Signed)
Hematology and Oncology Follow Up Visit  [name] [name] [id number] Feb 09, 1956 61 y.o. 12/05/2016   Principle Diagnosis:  Polycythemia vera- JAK2 (+)  Current Therapy:    Phlebotomy to maintain hematocrit below 45%  Hydrea 1000 mg by mouth daily  Aspirin 81 mg by mouth daily     Interim History:  [name] [name] is back for followup. He is not feeling as well as. We have not see him for about 3 months. He's been busy at work. He is under a lot of stress with work.  He has had no cough. He's had no fever. He's had no nausea or vomiting.  We last saw him in May, his ferritin was 7 with iron saturation of 12%.   He's taking his Hydrea. He is taking aspirin.  The big news is that his daughter in [place] might be moving to [place].  Overall, his performance status is ECOG 0.    Medications:  Current Outpatient Prescriptions:  .  acetaminophen (TYLENOL) 500 MG tablet, Take 1,000 mg by mouth every 6 (six) hours as needed. For pain , Disp: , Rfl:  .  aspirin 81 MG tablet, Take 81 mg by mouth daily.  , Disp: , Rfl:  .  buPROPion (WELLBUTRIN SR) 150 MG 12 hr tablet, Take 150 mg by mouth every morning., Disp: , Rfl: 12 .  clidinium-chlordiazePOXIDE (LIBRAX) 5-2.5 MG capsule, TAKE 1 CAPSULE BY MOUTH AS NEEDED every 12 hrs, Disp: 60 capsule, Rfl: 2 .  doxycycline (PERIOSTAT) 20 MG tablet, Take 20 mg by mouth 2 (two) times daily., Disp: , Rfl: 11 .  hydroxyurea (HYDREA) 500 MG capsule, TAKE 2 CAPSULES BY MOUTH DAILY. MAY TAKE WITH FOOD TO MINIMIZE GI SIDE EFFECTS, Disp: 60 capsule, Rfl: 5 .  hyoscyamine (LEVBID) 0.375 MG 12 hr tablet, Take 1 tablet (0.375 mg total) by mouth as needed., Disp: 60 tablet, Rfl: 4 .  propranolol ER (INDERAL LA) 120 MG 24 hr capsule, TAKE ONE CAPSULE BY MOUTH EVERY DAY, Disp: 30 capsule, Rfl: 4 .  zolpidem (AMBIEN CR) 12.5 MG CR tablet, Take by mouth as needed. For sleep pt usually takes 1/2 tablet, Disp: , Rfl:   Allergies: No Known Allergies  Past Medical  History, Surgical history, Social history, and Family History were reviewed and updated.  Review of Systems: As above  Physical Exam:  weight is 205 lb (93 kg). His oral temperature is 98.6 F (37 C). His blood pressure is 134/85 and his pulse is 66. His respiration is 17 and oxygen saturation is 96%.   Well-developed and well-nourished white gentleman. Head and neck exam shows no ocular or oral lesions. He does have the surgical incision on the lower eyelid and on the right eye. He has no scleral icterus. He has no adenopathy in the neck. Lungs are clear. Cardiac exam regular rate and rhythm with no murmurs, rubs or bruits. Abdomen is soft. He has good bowel sounds. There is no fluid wave. There is no palpable liver or spleen tip. Extremities shows no clubbing, cyanosis or edema. No palpable venous cord is noted. Genital exam does show some swelling in the right scrotal region. There is some tenderness to palpation of the right testicle. It is hard to discern if there is a mass. He may have a hernia. Skin exam no rashes, ecchymosis or petechia. Neurological exam shows no focal neurological deficits.  Lab Results  Component Value Date   WBC 11.4 (H) 12/05/2016   HGB 14.7 12/05/2016   HCT  46.6 12/05/2016   MCV 83 12/05/2016   PLT 668 (H) 12/05/2016     Chemistry      Component Value Date/Time   NA 141 12/05/2016 1430   NA 139 04/12/2016 1113   K 4.0 12/05/2016 1430   K 4.1 04/12/2016 1113   CL 104 12/05/2016 1430   CO2 30 12/05/2016 1430   CO2 22 04/12/2016 1113   BUN 10 12/05/2016 1430   BUN 16.3 04/12/2016 1113   CREATININE 0.9 12/05/2016 1430   CREATININE 0.9 04/12/2016 1113      Component Value Date/Time   CALCIUM 9.2 12/05/2016 1430   CALCIUM 9.1 04/12/2016 1113   ALKPHOS 54 12/05/2016 1430   ALKPHOS 66 04/12/2016 1113   AST 40 (H) 12/05/2016 1430   AST 31 04/12/2016 1113   ALT 35 12/05/2016 1430   ALT 36 04/12/2016 1113   BILITOT 0.90 12/05/2016 1430   BILITOT 0.73  04/12/2016 1113         Impression and Plan: [name] [name] is 61 year old gentleman with polycythemia vera.  We will go ahead and phlebotomize him.   I am somewhat surprised by his platelet count. He is on the Hydrea. We will have to watch this closely. I some of it might be from iron deficiency.  I have talked to him in the past about [place]. We still may have to consider this. Another option might be anagrelide since his platelets are the biggest issue.   I want to see him back in one month.    [name], MD 8/1/20183:29 PMit

## 2016-12-05 NOTE — Patient Instructions (Signed)
Therapeutic Phlebotomy Therapeutic phlebotomy is the controlled removal of blood from a person's body for the purpose of treating a medical condition. The procedure is similar to donating blood. Usually, about a pint (470 mL, or 0.47L) of blood is removed. The average adult has 9-12 pints (4.3-5.7 L) of blood. Therapeutic phlebotomy may be used to treat the following medical conditions:  Hemochromatosis. This is a condition in which the blood contains too much iron.  Polycythemia vera. This is a condition in which the blood contains too many red blood cells.  Porphyria cutanea tarda. This is a disease in which an important part of hemoglobin is not made properly. It results in the buildup of abnormal amounts of porphyrins in the body.  Sickle cell disease. This is a condition in which the red blood cells form an abnormal crescent shape rather than a round shape.  Tell a health care provider about:  Any allergies you have.  All medicines you are taking, including vitamins, herbs, eye drops, creams, and over-the-counter medicines.  Any problems you or family members have had with anesthetic medicines.  Any blood disorders you have.  Any surgeries you have had.  Any medical conditions you have. What are the risks? Generally, this is a safe procedure. However, problems may occur, including:  Nausea or light-headedness.  Low blood pressure.  Soreness, bleeding, swelling, or bruising at the needle insertion site.  Infection.  What happens before the procedure?  Follow instructions from your health care provider about eating or drinking restrictions.  Ask your health care provider about changing or stopping your regular medicines. This is especially important if you are taking diabetes medicines or blood thinners.  Wear clothing with sleeves that can be raised above the elbow.  Plan to have someone take you home after the procedure.  You may have a blood sample taken. What  happens during the procedure?  A needle will be inserted into one of your veins.  Tubing and a collection bag will be attached to that needle.  Blood will flow through the needle and tubing into the collection bag.  You may be asked to open and close your hand slowly and continually during the entire collection.  After the specified amount of blood has been removed from your body, the collection bag and tubing will be clamped.  The needle will be removed from your vein.  Pressure will be held on the site of the needle insertion to stop the bleeding.  A bandage (dressing) will be placed over the needle insertion site. The procedure may vary among health care providers and hospitals. What happens after the procedure?  Your recovery will be assessed and monitored.  You can return to your normal activities as directed by your health care provider. This information is not intended to replace advice given to you by your health care provider. Make sure you discuss any questions you have with your health care provider. Document Released: 09/25/2010 Document Revised: 12/24/2015 Document Reviewed: 04/19/2014 Elsevier Interactive Patient Education  2018 [employer] Inc[employer]  

## 2016-12-06 LAB — LACTATE DEHYDROGENASE: LDH: 439 U/L — ABNORMAL HIGH (ref 125–245)

## 2016-12-31 ENCOUNTER — Other Ambulatory Visit: Payer: Self-pay | Admitting: Hematology & Oncology

## 2016-12-31 DIAGNOSIS — D751 Secondary polycythemia: Secondary | ICD-10-CM

## 2017-01-02 ENCOUNTER — Ambulatory Visit (HOSPITAL_BASED_OUTPATIENT_CLINIC_OR_DEPARTMENT_OTHER): Payer: Managed Care, Other (non HMO) | Admitting: Hematology & Oncology

## 2017-01-02 ENCOUNTER — Other Ambulatory Visit (HOSPITAL_BASED_OUTPATIENT_CLINIC_OR_DEPARTMENT_OTHER): Payer: Managed Care, Other (non HMO)

## 2017-01-02 DIAGNOSIS — G4452 New daily persistent headache (NDPH): Secondary | ICD-10-CM | POA: Diagnosis not present

## 2017-01-02 DIAGNOSIS — D45 Polycythemia vera: Secondary | ICD-10-CM | POA: Diagnosis not present

## 2017-01-02 DIAGNOSIS — D751 Secondary polycythemia: Secondary | ICD-10-CM

## 2017-01-02 LAB — CMP (CANCER CENTER ONLY)
ALT(SGPT): 40 U/L (ref 10–47)
AST: 43 U/L — ABNORMAL HIGH (ref 11–38)
Albumin: 4.2 g/dL (ref 3.3–5.5)
Alkaline Phosphatase: 66 U/L (ref 26–84)
BUN, Bld: 10 mg/dL (ref 7–22)
CO2: 28 mEq/L (ref 18–33)
Calcium: 9.2 mg/dL (ref 8.0–10.3)
Chloride: 106 mEq/L (ref 98–108)
Creat: 1.2 mg/dl (ref 0.6–1.2)
Glucose, Bld: 121 mg/dL — ABNORMAL HIGH (ref 73–118)
Potassium: 3.9 mEq/L (ref 3.3–4.7)
Sodium: 141 mEq/L (ref 128–145)
Total Bilirubin: 1.1 mg/dl (ref 0.20–1.60)
Total Protein: 6.9 g/dL (ref 6.4–8.1)

## 2017-01-02 LAB — LACTATE DEHYDROGENASE: LDH: 312 U/L — ABNORMAL HIGH (ref 125–245)

## 2017-01-02 LAB — CBC WITH DIFFERENTIAL (CANCER CENTER ONLY)
BASO#: 0.2 10*3/uL (ref 0.0–0.2)
BASO%: 2.8 % — ABNORMAL HIGH (ref 0.0–2.0)
EOS%: 4.2 % (ref 0.0–7.0)
Eosinophils Absolute: 0.3 10*3/uL (ref 0.0–0.5)
HCT: 43.9 % (ref 38.7–49.9)
HGB: 14.1 g/dL (ref 13.0–17.1)
LYMPH#: 0.9 10*3/uL (ref 0.9–3.3)
LYMPH%: 12.7 % — ABNORMAL LOW (ref 14.0–48.0)
MCH: 26.5 pg — ABNORMAL LOW (ref 28.0–33.4)
MCHC: 32.1 g/dL (ref 32.0–35.9)
MCV: 83 fL (ref 82–98)
MONO#: 0.4 10*3/uL (ref 0.1–0.9)
MONO%: 6.4 % (ref 0.0–13.0)
NEUT#: 5.1 10*3/uL (ref 1.5–6.5)
NEUT%: 73.9 % (ref 40.0–80.0)
Platelets: 551 10*3/uL — ABNORMAL HIGH (ref 145–400)
RBC: 5.32 10*6/uL (ref 4.20–5.70)
RDW: 18.1 % — ABNORMAL HIGH (ref 11.1–15.7)
WBC: 6.9 10*3/uL (ref 4.0–10.0)

## 2017-01-02 LAB — CHCC SATELLITE - SMEAR

## 2017-01-02 NOTE — Progress Notes (Signed)
Hematology and Oncology Follow Up Visit  [name] [name] [id number] 11-29-1955 61 y.o. 01/02/2017   Principle Diagnosis:  Polycythemia vera- JAK2 (+)  Current Therapy:    Phlebotomy to maintain hematocrit below 45%  Hydrea 1000 mg by mouth daily  Aspirin 81 mg by mouth daily     Interim History:  [name] is back for followup.  His main complaints are headaches. They seem to be more so on the right side. They are more prevalent. There is no visual symptoms. He has no nausea or vomiting. He has no weakness. He is still working without difficulties.  He is on aspirin.  There is a history of aneurysms in the family.   He says that he is taking his Hydrea more diligently and on schedule.   There is no fever. He's had no nausea or vomiting. The headaches don't cause any nausea.  He's had no rashes. He's had no urinary issues. His no bowel or bladder incontinence.   There's been no chest pain. He's had no cough. He says  coughing does make his headaches a little bit worse.   For right now, his performance status is ECOG 0   Medications:  Current Outpatient Prescriptions:  .  acetaminophen (TYLENOL) 500 MG tablet, Take 1,000 mg by mouth every 6 (six) hours as needed. For pain , Disp: , Rfl:  .  aspirin 81 MG tablet, Take 81 mg by mouth daily.  , Disp: , Rfl:  .  buPROPion (WELLBUTRIN SR) 150 MG 12 hr tablet, Take 150 mg by mouth every morning., Disp: , Rfl: 12 .  clidinium-chlordiazePOXIDE (LIBRAX) 5-2.5 MG capsule, TAKE 1 CAPSULE BY MOUTH AS NEEDED every 12 hrs, Disp: 60 capsule, Rfl: 2 .  fluorouracil (EFUDEX) 5 % cream, , Disp: , Rfl:  .  hydroxyurea (HYDREA) 500 MG capsule, TAKE 2 CAPSULES BY MOUTH DAILY. MAY TAKE WITH FOOD TO MINIMIZE GI SIDE EFFECTS, Disp: 60 capsule, Rfl: 5 .  hyoscyamine (LEVBID) 0.375 MG 12 hr tablet, Take 1 tablet (0.375 mg total) by mouth as needed., Disp: 60 tablet, Rfl: 4 .  propranolol ER (INDERAL LA) 120 MG 24 hr capsule, TAKE ONE CAPSULE BY  MOUTH EVERY DAY, Disp: 30 capsule, Rfl: 4 .  zolpidem (AMBIEN CR) 12.5 MG CR tablet, Take by mouth as needed. For sleep pt usually takes 1/2 tablet, Disp: , Rfl:   Allergies: No Known Allergies  Past Medical History, Surgical history, Social history, and Family History were reviewed and updated.  Review of Systems:  as stated in the interim history  Physical Exam:  weight is 204 lb (92.5 kg). His oral temperature is 98.3 F (36.8 C). His blood pressure is 130/79 and his pulse is 66. His respiration is 16 and oxygen saturation is 98%.   Physical Exam  Constitutional: He is oriented to person, place, and time.  HENT:  Head: Normocephalic and atraumatic.  Mouth/Throat: Oropharynx is clear and moist.  Eyes: Pupils are equal, round, and reactive to light. EOM are normal.  Neck: Normal range of motion.  Cardiovascular: Normal rate, regular rhythm and normal heart sounds.   Pulmonary/Chest: Effort normal and breath sounds normal.  Abdominal: Soft. Bowel sounds are normal.  Musculoskeletal: Normal range of motion. He exhibits no edema, tenderness or deformity.  Lymphadenopathy:    He has no cervical adenopathy.  Neurological: He is alert and oriented to person, place, and time.  Skin: Skin is warm and dry. No rash noted. No erythema.  Psychiatric: He has  a normal mood and affect. His behavior is normal. Judgment and thought content normal.  Vitals reviewed.    Lab Results  Component Value Date   WBC 6.9 01/02/2017   HGB 14.1 01/02/2017   HCT 43.9 01/02/2017   MCV 83 01/02/2017   PLT 551 (H) 01/02/2017     Chemistry      Component Value Date/Time   NA 141 01/02/2017 1334   NA 139 04/12/2016 1113   K 3.9 01/02/2017 1334   K 4.1 04/12/2016 1113   CL 106 01/02/2017 1334   CO2 28 01/02/2017 1334   CO2 22 04/12/2016 1113   BUN 10 01/02/2017 1334   BUN 16.3 04/12/2016 1113   CREATININE 1.2 01/02/2017 1334   CREATININE 0.9 04/12/2016 1113      Component Value Date/Time    CALCIUM 9.2 01/02/2017 1334   CALCIUM 9.1 04/12/2016 1113   ALKPHOS 66 01/02/2017 1334   ALKPHOS 66 04/12/2016 1113   AST 43 (H) 01/02/2017 1334   AST 31 04/12/2016 1113   ALT 40 01/02/2017 1334   ALT 36 04/12/2016 1113   BILITOT 1.10 01/02/2017 1334   BILITOT 0.73 04/12/2016 1113         Impression and Plan: [name] [name] is 61 year old gentleman with polycythemia vera.    He does not need to be phlebotomized today. I'm happy with his platelet count. I'm surprised that his MCV is only 83. He says that he is much more diligent with taking the Hydrea.   However, I am a little worried about these headaches. I think that a MRA of the brain would be reasonable. There is a family history of aneurysms. I just think we should make sure he does not have one.  I talked to him about this. He is definitely agreeable.   We will get 1 set up for one week.   I would like to see him back in 4 weeks. We may have to phlebotomize him then.  [name], MD 8/29/20183:09 PM

## 2017-01-03 ENCOUNTER — Other Ambulatory Visit: Payer: Self-pay | Admitting: Physician Assistant

## 2017-01-03 LAB — IRON AND TIBC
%SAT: 7 % — ABNORMAL LOW (ref 20–55)
Iron: 33 ug/dL — ABNORMAL LOW (ref 42–163)
TIBC: 476 ug/dL — ABNORMAL HIGH (ref 202–409)
UIBC: 443 ug/dL — ABNORMAL HIGH (ref 117–376)

## 2017-01-03 LAB — FERRITIN: Ferritin: 7 ng/ml — ABNORMAL LOW (ref 22–316)

## 2017-01-19 ENCOUNTER — Ambulatory Visit (HOSPITAL_BASED_OUTPATIENT_CLINIC_OR_DEPARTMENT_OTHER)
Admission: RE | Admit: 2017-01-19 | Discharge: 2017-01-19 | Disposition: A | Discharge status: Home / Self Care | Payer: Managed Care, Other (non HMO) | Source: Ambulatory Visit | Attending: Hematology & Oncology | Admitting: Hematology & Oncology

## 2017-01-19 DIAGNOSIS — G4452 New daily persistent headache (NDPH): Secondary | ICD-10-CM | POA: Diagnosis present

## 2017-01-21 ENCOUNTER — Telehealth: Payer: Self-pay | Admitting: *Deleted

## 2017-01-21 NOTE — Telephone Encounter (Signed)
-----   Message from [name], MD sent at 01/21/2017  7:05 AM EDT ----- Call - NO vascular problems.  No aneurysm!!! Laurey Arrow

## 2017-01-30 ENCOUNTER — Other Ambulatory Visit (HOSPITAL_BASED_OUTPATIENT_CLINIC_OR_DEPARTMENT_OTHER): Payer: Managed Care, Other (non HMO)

## 2017-01-30 ENCOUNTER — Ambulatory Visit (HOSPITAL_BASED_OUTPATIENT_CLINIC_OR_DEPARTMENT_OTHER): Payer: Managed Care, Other (non HMO) | Admitting: Hematology & Oncology

## 2017-01-30 VITALS — BP 134/81 | HR 64 | Temp 98.2°F | Resp 17 | Wt 202.0 lb

## 2017-01-30 DIAGNOSIS — D45 Polycythemia vera: Secondary | ICD-10-CM

## 2017-01-30 DIAGNOSIS — G4452 New daily persistent headache (NDPH): Secondary | ICD-10-CM

## 2017-01-30 DIAGNOSIS — D751 Secondary polycythemia: Secondary | ICD-10-CM

## 2017-01-30 LAB — CBC WITH DIFFERENTIAL (CANCER CENTER ONLY)
BASO#: 0.2 10*3/uL (ref 0.0–0.2)
BASO%: 3.5 % — ABNORMAL HIGH (ref 0.0–2.0)
EOS%: 3.5 % (ref 0.0–7.0)
Eosinophils Absolute: 0.2 10*3/uL (ref 0.0–0.5)
HCT: 43.8 % (ref 38.7–49.9)
HGB: 14.3 g/dL (ref 13.0–17.1)
LYMPH#: 1 10*3/uL (ref 0.9–3.3)
LYMPH%: 16.3 % (ref 14.0–48.0)
MCH: 27.2 pg — ABNORMAL LOW (ref 28.0–33.4)
MCHC: 32.6 g/dL (ref 32.0–35.9)
MCV: 83 fL (ref 82–98)
MONO#: 0.6 10*3/uL (ref 0.1–0.9)
MONO%: 9.7 % (ref 0.0–13.0)
NEUT#: 4.2 10*3/uL (ref 1.5–6.5)
NEUT%: 67 % (ref 40.0–80.0)
Platelets: 250 10*3/uL (ref 145–400)
RBC: 5.25 10*6/uL (ref 4.20–5.70)
RDW: 19.8 % — ABNORMAL HIGH (ref 11.1–15.7)
WBC: 6.3 10*3/uL (ref 4.0–10.0)

## 2017-01-30 LAB — CMP (CANCER CENTER ONLY)
ALT(SGPT): 47 U/L (ref 10–47)
AST: 38 U/L (ref 11–38)
Albumin: 4.1 g/dL (ref 3.3–5.5)
Alkaline Phosphatase: 66 U/L (ref 26–84)
BUN, Bld: 12 mg/dL (ref 7–22)
CO2: 28 mEq/L (ref 18–33)
Calcium: 9.2 mg/dL (ref 8.0–10.3)
Chloride: 103 mEq/L (ref 98–108)
Creat: 0.9 mg/dl (ref 0.6–1.2)
Glucose, Bld: 109 mg/dL (ref 73–118)
Potassium: 3.7 mEq/L (ref 3.3–4.7)
Sodium: 138 mEq/L (ref 128–145)
Total Bilirubin: 1.3 mg/dl (ref 0.20–1.60)
Total Protein: 6.7 g/dL (ref 6.4–8.1)

## 2017-01-30 NOTE — Progress Notes (Signed)
Hematology and Oncology Follow Up Visit  [name] [id number] 1955/05/25 61 y.o. 01/30/2017   Principle Diagnosis:  Polycythemia vera- JAK2 (+)  Current Therapy:    Phlebotomy to maintain hematocrit below 45%  Hydrea 1000 mg by mouth daily  Aspirin 81 mg by mouth daily     Interim History:  Mr.  [name] [name] is back for followup. He is doing better. His headaches are not as frequent. We did do an MRI of his brain. Thankfully, the MRI did not show anything that was vascular, specifically, there are no aneurysms.  He has had no problems with rashes. He's had no leg swelling. He's had no cough or shortness of breath. He's had no nausea or vomiting.  He's done well with the Hydrea.  He's had no fever.  He is still working. 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  Overall, his performance status is ECOG 0.    Medications:  Current Outpatient Prescriptions:  .  acetaminophen (TYLENOL) 500 MG tablet, Take 1,000 mg by mouth every 6 (six) hours as needed. For pain , Disp: , Rfl:  .  aspirin 81 MG tablet, Take 81 mg by mouth daily.  , Disp: , Rfl:  .  buPROPion (WELLBUTRIN SR) 150 MG 12 hr tablet, Take 150 mg by mouth every morning., Disp: , Rfl: 12 .  clidinium-chlordiazePOXIDE (LIBRAX) 5-2.5 MG capsule, TAKE 1 CAPSULE BY MOUTH AS NEEDED every 12 hrs, Disp: 60 capsule, Rfl: 2 .  fluorouracil (EFUDEX) 5 % cream, , Disp: , Rfl:  .  hydroxyurea (HYDREA) 500 MG capsule, TAKE 2 CAPSULES BY MOUTH DAILY. MAY TAKE WITH FOOD TO MINIMIZE GI SIDE EFFECTS, Disp: 60 capsule, Rfl: 5 .  hyoscyamine (LEVBID) 0.375 MG 12 hr tablet, Take 1 tablet (0.375 mg total) by mouth as needed., Disp: 60 tablet, Rfl: 4 .  propranolol ER (INDERAL LA) 120 MG 24 hr capsule, TAKE ONE CAPSULE BY MOUTH EVERY DAY, Disp: 30 capsule, Rfl: 4 .  zolpidem (AMBIEN CR) 12.5 MG CR tablet, Take by mouth as needed. For sleep pt usually takes 1/2 tablet, Disp: , Rfl:   Allergies: No Known Allergies  Past  Medical History, Surgical history, Social history, and Family History were reviewed and updated.  Review of Systems:  as stated in the interim history  Physical Exam:  weight is 202 lb (91.6 kg). His oral temperature is 98.2 F (36.8 C). His blood pressure is 134/81 and his pulse is 64. His respiration is 17 and oxygen saturation is 97%.   Physical Exam  Constitutional: He is oriented to person, place, and time.  HENT:  Head: Normocephalic and atraumatic.  Mouth/Throat: Oropharynx is clear and moist.  Eyes: Pupils are equal, round, and reactive to light. EOM are normal.  Neck: Normal range of motion.  Cardiovascular: Normal rate, regular rhythm and normal heart sounds.   Pulmonary/Chest: Effort normal and breath sounds normal.  Abdominal: Soft. Bowel sounds are normal.  Musculoskeletal: Normal range of motion. He exhibits no edema, tenderness or deformity.  Lymphadenopathy:    He has no cervical adenopathy.  Neurological: He is alert and oriented to person, place, and time.  Skin: Skin is warm and dry. No rash noted. No erythema.  Psychiatric: He has a normal mood and affect. His behavior is normal. Judgment and thought content normal.  Vitals reviewed.    Lab Results  Component Value Date   WBC 6.3 01/30/2017   HGB 14.3 01/30/2017   HCT 43.8 01/30/2017  MCV 83 01/30/2017   PLT 250 01/30/2017     Chemistry      Component Value Date/Time   NA 138 01/30/2017 1508   NA 139 04/12/2016 1113   K 3.7 01/30/2017 1508   K 4.1 04/12/2016 1113   CL 103 01/30/2017 1508   CO2 28 01/30/2017 1508   CO2 22 04/12/2016 1113   BUN 12 01/30/2017 1508   BUN 16.3 04/12/2016 1113   CREATININE 0.9 01/30/2017 1508   CREATININE 0.9 04/12/2016 1113      Component Value Date/Time   CALCIUM 9.2 01/30/2017 1508   CALCIUM 9.1 04/12/2016 1113   ALKPHOS 66 01/30/2017 1508   ALKPHOS 66 04/12/2016 1113   AST 38 01/30/2017 1508   AST 31 04/12/2016 1113   ALT 47 01/30/2017 1508   ALT 36  04/12/2016 1113   BILITOT 1.30 01/30/2017 1508   BILITOT 0.73 04/12/2016 1113         Impression and Plan: Mr. [name] is 61 year old gentleman with polycythemia vera.  Thankfully, the MRI looked good.  His platelet count is also much better. I think this may have been a contributor to his headaches.  He does not need to be phlebotomized.  His blood count has dropped quite a bit since he was last here. As such, thing we have to check his blood count in 2 weeks.  We will adjust his Hydrea dose depending on what his platelet count is only see him back.  I would like to see him back in one month.     [name], MD 9/26/20185:27 PM

## 2017-01-31 LAB — IRON AND TIBC
%SAT: 20 % (ref 20–55)
Iron: 94 ug/dL (ref 42–163)
TIBC: 480 ug/dL — ABNORMAL HIGH (ref 202–409)
UIBC: 386 ug/dL — ABNORMAL HIGH (ref 117–376)

## 2017-01-31 LAB — FERRITIN: Ferritin: 12 ng/ml — ABNORMAL LOW (ref 22–316)

## 2017-01-31 LAB — LACTATE DEHYDROGENASE: LDH: 259 U/L — ABNORMAL HIGH (ref 125–245)

## 2017-02-13 ENCOUNTER — Other Ambulatory Visit (HOSPITAL_BASED_OUTPATIENT_CLINIC_OR_DEPARTMENT_OTHER): Payer: Managed Care, Other (non HMO)

## 2017-02-13 DIAGNOSIS — D45 Polycythemia vera: Secondary | ICD-10-CM | POA: Diagnosis not present

## 2017-02-13 DIAGNOSIS — D751 Secondary polycythemia: Secondary | ICD-10-CM

## 2017-02-13 LAB — CBC WITH DIFFERENTIAL (CANCER CENTER ONLY)
BASO#: 0.4 10*3/uL — ABNORMAL HIGH (ref 0.0–0.2)
BASO%: 3.9 % — ABNORMAL HIGH (ref 0.0–2.0)
EOS%: 4.2 % (ref 0.0–7.0)
Eosinophils Absolute: 0.4 10*3/uL (ref 0.0–0.5)
HCT: 43.1 % (ref 38.7–49.9)
HGB: 14 g/dL (ref 13.0–17.1)
LYMPH#: 1.1 10*3/uL (ref 0.9–3.3)
LYMPH%: 12.7 % — ABNORMAL LOW (ref 14.0–48.0)
MCH: 28.6 pg (ref 28.0–33.4)
MCHC: 32.5 g/dL (ref 32.0–35.9)
MCV: 88 fL (ref 82–98)
MONO#: 0.7 10*3/uL (ref 0.1–0.9)
MONO%: 7.8 % (ref 0.0–13.0)
NEUT#: 6.3 10*3/uL (ref 1.5–6.5)
NEUT%: 71.4 % (ref 40.0–80.0)
Platelets: 627 10*3/uL — ABNORMAL HIGH (ref 145–400)
RBC: 4.89 10*6/uL (ref 4.20–5.70)
RDW: 22.2 % — ABNORMAL HIGH (ref 11.1–15.7)
WBC: 8.9 10*3/uL (ref 4.0–10.0)

## 2017-02-13 LAB — TECHNOLOGIST REVIEW CHCC SATELLITE

## 2017-03-06 ENCOUNTER — Other Ambulatory Visit (HOSPITAL_BASED_OUTPATIENT_CLINIC_OR_DEPARTMENT_OTHER): Payer: Managed Care, Other (non HMO)

## 2017-03-06 ENCOUNTER — Ambulatory Visit (HOSPITAL_BASED_OUTPATIENT_CLINIC_OR_DEPARTMENT_OTHER): Payer: Managed Care, Other (non HMO)

## 2017-03-06 ENCOUNTER — Ambulatory Visit (HOSPITAL_BASED_OUTPATIENT_CLINIC_OR_DEPARTMENT_OTHER): Payer: Managed Care, Other (non HMO) | Admitting: Hematology & Oncology

## 2017-03-06 VITALS — BP 147/85 | HR 71 | Temp 98.1°F | Resp 18 | Wt 205.0 lb

## 2017-03-06 VITALS — BP 147/86 | HR 67

## 2017-03-06 DIAGNOSIS — I10 Essential (primary) hypertension: Secondary | ICD-10-CM | POA: Diagnosis not present

## 2017-03-06 DIAGNOSIS — D45 Polycythemia vera: Secondary | ICD-10-CM

## 2017-03-06 DIAGNOSIS — D751 Secondary polycythemia: Secondary | ICD-10-CM

## 2017-03-06 LAB — CBC WITH DIFFERENTIAL (CANCER CENTER ONLY)
BASO#: 0.3 10*3/uL — ABNORMAL HIGH (ref 0.0–0.2)
BASO%: 2.5 % — ABNORMAL HIGH (ref 0.0–2.0)
EOS%: 4.1 % (ref 0.0–7.0)
Eosinophils Absolute: 0.5 10*3/uL (ref 0.0–0.5)
HCT: 45.5 % (ref 38.7–49.9)
HGB: 14.7 g/dL (ref 13.0–17.1)
LYMPH#: 1.2 10*3/uL (ref 0.9–3.3)
LYMPH%: 9.4 % — ABNORMAL LOW (ref 14.0–48.0)
MCH: 28.8 pg (ref 28.0–33.4)
MCHC: 32.3 g/dL (ref 32.0–35.9)
MCV: 89 fL (ref 82–98)
MONO#: 0.6 10*3/uL (ref 0.1–0.9)
MONO%: 5.2 % (ref 0.0–13.0)
NEUT#: 9.7 10*3/uL — ABNORMAL HIGH (ref 1.5–6.5)
NEUT%: 78.8 % (ref 40.0–80.0)
Platelets: 591 10*3/uL — ABNORMAL HIGH (ref 145–400)
RBC: 5.1 10*6/uL (ref 4.20–5.70)
RDW: 20.2 % — ABNORMAL HIGH (ref 11.1–15.7)
WBC: 12.3 10*3/uL — ABNORMAL HIGH (ref 4.0–10.0)

## 2017-03-06 LAB — CMP (CANCER CENTER ONLY)
ALT(SGPT): 57 U/L — ABNORMAL HIGH (ref 10–47)
AST: 46 U/L — ABNORMAL HIGH (ref 11–38)
Albumin: 4.1 g/dL (ref 3.3–5.5)
Alkaline Phosphatase: 83 U/L (ref 26–84)
BUN, Bld: 12 mg/dL (ref 7–22)
CO2: 28 mEq/L (ref 18–33)
Calcium: 9.4 mg/dL (ref 8.0–10.3)
Chloride: 105 mEq/L (ref 98–108)
Creat: 0.9 mg/dl (ref 0.6–1.2)
Glucose, Bld: 108 mg/dL (ref 73–118)
Potassium: 3.6 mEq/L (ref 3.3–4.7)
Sodium: 145 mEq/L (ref 128–145)
Total Bilirubin: 0.8 mg/dl (ref 0.20–1.60)
Total Protein: 6.5 g/dL (ref 6.4–8.1)

## 2017-03-06 LAB — CHCC SATELLITE - SMEAR

## 2017-03-06 MED ORDER — CLONIDINE HCL 0.1 MG PO TABS: 0.1000 mg | ORAL_TABLET | Freq: Every day | ORAL | 11 refills | Status: DC

## 2017-03-06 NOTE — Progress Notes (Signed)
Abbe Amsterdam presents today for phlebotomy per MD orders. Phlebotomy procedure started at 1558 and ended at 1612. 538 grams removed using 16g phlebotomy kit from [facility]. Patient observed for 30 minutes after procedure without any incident. Patient tolerated procedure well. IV needle removed intact.

## 2017-03-06 NOTE — Patient Instructions (Signed)
Therapeutic Phlebotomy Therapeutic phlebotomy is the controlled removal of blood from a person's body for the purpose of treating a medical condition. The procedure is similar to donating blood. Usually, about a pint (470 mL, or 0.47L) of blood is removed. The average adult has 9-12 pints (4.3-5.7 L) of blood. Therapeutic phlebotomy may be used to treat the following medical conditions:  Hemochromatosis. This is a condition in which the blood contains too much iron.  Polycythemia vera. This is a condition in which the blood contains too many red blood cells.  Porphyria cutanea tarda. This is a disease in which an important part of hemoglobin is not made properly. It results in the buildup of abnormal amounts of porphyrins in the body.  Sickle cell disease. This is a condition in which the red blood cells form an abnormal crescent shape rather than a round shape.  Tell a health care provider about:  Any allergies you have.  All medicines you are taking, including vitamins, herbs, eye drops, creams, and over-the-counter medicines.  Any problems you or family members have had with anesthetic medicines.  Any blood disorders you have.  Any surgeries you have had.  Any medical conditions you have. What are the risks? Generally, this is a safe procedure. However, problems may occur, including:  Nausea or light-headedness.  Low blood pressure.  Soreness, bleeding, swelling, or bruising at the needle insertion site.  Infection.  What happens before the procedure?  Follow instructions from your health care provider about eating or drinking restrictions.  Ask your health care provider about changing or stopping your regular medicines. This is especially important if you are taking diabetes medicines or blood thinners.  Wear clothing with sleeves that can be raised above the elbow.  Plan to have someone take you home after the procedure.  You may have a blood sample taken. What  happens during the procedure?  A needle will be inserted into one of your veins.  Tubing and a collection bag will be attached to that needle.  Blood will flow through the needle and tubing into the collection bag.  You may be asked to open and close your hand slowly and continually during the entire collection.  After the specified amount of blood has been removed from your body, the collection bag and tubing will be clamped.  The needle will be removed from your vein.  Pressure will be held on the site of the needle insertion to stop the bleeding.  A bandage (dressing) will be placed over the needle insertion site. The procedure may vary among health care providers and hospitals. What happens after the procedure?  Your recovery will be assessed and monitored.  You can return to your normal activities as directed by your health care provider. This information is not intended to replace advice given to you by your health care provider. Make sure you discuss any questions you have with your health care provider. Document Released: 09/25/2010 Document Revised: 12/24/2015 Document Reviewed: 04/19/2014 Elsevier Interactive Patient Education  2018 [employer] Inc[employer]  
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
## 2017-03-07 LAB — IRON AND TIBC
%SAT: 10 % — ABNORMAL LOW (ref 20–55)
Iron: 45 ug/dL (ref 42–163)
TIBC: 463 ug/dL — ABNORMAL HIGH (ref 202–409)
UIBC: 418 ug/dL — ABNORMAL HIGH (ref 117–376)

## 2017-03-07 LAB — LACTATE DEHYDROGENASE: LDH: 590 U/L — ABNORMAL HIGH (ref 125–245)

## 2017-03-07 LAB — FERRITIN: Ferritin: 13 ng/ml — ABNORMAL LOW (ref 22–316)

## 2017-03-15 ENCOUNTER — Encounter: Payer: Self-pay | Admitting: Cardiology

## 2017-03-27 ENCOUNTER — Other Ambulatory Visit: Payer: Self-pay | Admitting: Hematology & Oncology

## 2017-04-03 ENCOUNTER — Other Ambulatory Visit: Payer: Self-pay

## 2017-04-03 ENCOUNTER — Encounter: Payer: Self-pay | Admitting: Family

## 2017-04-03 ENCOUNTER — Ambulatory Visit (HOSPITAL_BASED_OUTPATIENT_CLINIC_OR_DEPARTMENT_OTHER): Payer: Managed Care, Other (non HMO) | Admitting: Family

## 2017-04-03 ENCOUNTER — Other Ambulatory Visit: Payer: Managed Care, Other (non HMO)

## 2017-04-03 VITALS — BP 151/75 | HR 95 | Temp 98.0°F | Wt 204.0 lb

## 2017-04-03 DIAGNOSIS — R5383 Other fatigue: Secondary | ICD-10-CM

## 2017-04-03 DIAGNOSIS — D500 Iron deficiency anemia secondary to blood loss (chronic): Secondary | ICD-10-CM

## 2017-04-03 DIAGNOSIS — I10 Essential (primary) hypertension: Secondary | ICD-10-CM

## 2017-04-03 DIAGNOSIS — R51 Headache: Secondary | ICD-10-CM | POA: Diagnosis not present

## 2017-04-03 DIAGNOSIS — D751 Secondary polycythemia: Secondary | ICD-10-CM

## 2017-04-03 DIAGNOSIS — D45 Polycythemia vera: Secondary | ICD-10-CM

## 2017-04-03 LAB — CMP (CANCER CENTER ONLY)
ALT(SGPT): 61 U/L — ABNORMAL HIGH (ref 10–47)
AST: 47 U/L — ABNORMAL HIGH (ref 11–38)
Albumin: 3.9 g/dL (ref 3.3–5.5)
Alkaline Phosphatase: 79 U/L (ref 26–84)
BUN, Bld: 16 mg/dL (ref 7–22)
CO2: 29 mEq/L (ref 18–33)
Calcium: 8.8 mg/dL (ref 8.0–10.3)
Chloride: 105 mEq/L (ref 98–108)
Creat: 1 mg/dl (ref 0.6–1.2)
Glucose, Bld: 89 mg/dL (ref 73–118)
Potassium: 4.1 mEq/L (ref 3.3–4.7)
Sodium: 143 mEq/L (ref 128–145)
Total Bilirubin: 0.8 mg/dl (ref 0.20–1.60)
Total Protein: 6.6 g/dL (ref 6.4–8.1)

## 2017-04-03 LAB — CBC WITH DIFFERENTIAL (CANCER CENTER ONLY)
BASO#: 0.4 10*3/uL — ABNORMAL HIGH (ref 0.0–0.2)
BASO%: 2.9 % — ABNORMAL HIGH (ref 0.0–2.0)
EOS%: 4.2 % (ref 0.0–7.0)
Eosinophils Absolute: 0.5 10*3/uL (ref 0.0–0.5)
HCT: 43 % (ref 38.7–49.9)
HGB: 14 g/dL (ref 13.0–17.1)
LYMPH#: 1.4 10*3/uL (ref 0.9–3.3)
LYMPH%: 11.4 % — ABNORMAL LOW (ref 14.0–48.0)
MCH: 28.9 pg (ref 28.0–33.4)
MCHC: 32.6 g/dL (ref 32.0–35.9)
MCV: 89 fL (ref 82–98)
MONO#: 1 10*3/uL — ABNORMAL HIGH (ref 0.1–0.9)
MONO%: 7.6 % (ref 0.0–13.0)
NEUT#: 9.3 10*3/uL — ABNORMAL HIGH (ref 1.5–6.5)
NEUT%: 73.9 % (ref 40.0–80.0)
Platelets: 961 10*3/uL — ABNORMAL HIGH (ref 145–400)
RBC: 4.84 10*6/uL (ref 4.20–5.70)
RDW: 18.4 % — ABNORMAL HIGH (ref 11.1–15.7)
WBC: 12.6 10*3/uL — ABNORMAL HIGH (ref 4.0–10.0)

## 2017-04-03 MED ORDER — HYDROXYUREA 500 MG PO CAPS: ORAL_CAPSULE | ORAL | 5 refills | Status: DC

## 2017-04-03 NOTE — Progress Notes (Signed)
Hematology and Oncology Follow Up Visit  Geofrey Silliman [id number] 25-Apr-1956 61 y.o. 04/03/2017   Principle Diagnosis:  Polycythemia vera- JAK2 (+)  Current Therapy:   Phlebotomy to maintain hematocrit below 45% Hydrea 1000 mg by mouth daily Aspirin 81 mg by mouth daily   Interim History:  Mr. Massi is here today for follow-up. He is feeling fatigued and still having a headache. He has had occasional palpitations, SOB with exertion and intermittent numbness and tingling in his feet and hands.  His BP is a bit elevated at 151/75, HR 95. He is taking clonidine and propranolol ER daily.  He verbalized that he has been taking Hydrea 500 mg PO BID and platelet count today is up to 961. He is also taking his 1 baby aspirin a day.  No fever, chills, n/v, cough, rash, dizziness, chest pain, abdominal pain or changes in bowel or bladder habits.  He injured his knee over a month ago. He has been to Dr. Rhona Raider and received a cortisone injection. This has helped a little. He follows up again in another month or so to determine if he will need surgical intervention.  He has a good appetite and is staying well hydrated. His weight is stable.   ECOG Performance Status: 1 - Symptomatic but completely ambulatory  Medications:  Allergies as of 04/03/2017   No Known Allergies     Medication List        Accurate as of 04/03/17  3:58 PM. Always use your most recent med list.          acetaminophen 500 MG tablet Commonly known as:  TYLENOL Take 1,000 mg by mouth every 6 (six) hours as needed. For pain   aspirin 81 MG tablet Take 81 mg by mouth daily.   buPROPion 150 MG 12 hr tablet Commonly known as:  WELLBUTRIN SR Take 150 mg by mouth every morning.   clidinium-chlordiazePOXIDE 5-2.5 MG capsule Commonly known as:  LIBRAX TAKE 1 CAPSULE BY MOUTH AS NEEDED every 12 hrs   cloNIDine 0.1 MG tablet Commonly known as:  CATAPRES Take 1 tablet (0.1 mg total) by mouth daily.     fluorouracil 5 % cream Commonly known as:  EFUDEX   hydroxyurea 500 MG capsule Commonly known as:  HYDREA TAKE 2 CAPSULES BY MOUTH DAILY. MAY TAKE WITH FOOD TO MINIMIZE GI SIDE EFFECTS   hyoscyamine 0.375 MG 12 hr tablet Commonly known as:  LEVBID Take 1 tablet (0.375 mg total) by mouth as needed.   propranolol ER 120 MG 24 hr capsule Commonly known as:  INDERAL LA TAKE ONE CAPSULE BY MOUTH EVERY DAY   zolpidem 12.5 MG CR tablet Commonly known as:  AMBIEN CR Take by mouth as needed. For sleep pt usually takes 1/2 tablet       Allergies: No Known Allergies  Past Medical History, Surgical history, Social history, and Family History were reviewed and updated.  Review of Systems: All other 10 point review of systems is negative.   Physical Exam:  weight is 204 lb (92.5 kg). His oral temperature is 98 F (36.7 C). His blood pressure is 151/75 (abnormal) and his pulse is 95. His oxygen saturation is 100%.   Wt Readings from Last 3 Encounters:  04/03/17 204 lb (92.5 kg)  03/06/17 205 lb (93 kg)  01/30/17 202 lb (91.6 kg)    Ocular: Sclerae unicteric, pupils equal, round and reactive to light Ear-nose-throat: Oropharynx clear, dentition fair Lymphatic: No cervical, supraclavicular or axillary adenopathy Lungs  no rales or rhonchi, good excursion bilaterally Heart regular rate and rhythm, no murmur appreciated Abd soft, nontender, positive bowel sounds, no liver or spleen tip palpated on exam, no fluid wave MSK no focal spinal tenderness, no joint edema Neuro: non-focal, well-oriented, appropriate affect Breasts: Deferred   Lab Results  Component Value Date   WBC 12.6 (H) 04/03/2017   HGB 14.0 04/03/2017   HCT 43.0 04/03/2017   MCV 89 04/03/2017   PLT 961 (H) 04/03/2017   Lab Results  Component Value Date   FERRITIN 13 (L) 03/06/2017   IRON 45 03/06/2017   TIBC 463 (H) 03/06/2017   UIBC 418 (H) 03/06/2017   IRONPCTSAT 10 (L) 03/06/2017   Lab Results   Component Value Date   RETICCTPCT 1.4 02/21/2011   RBC 4.84 04/03/2017   RETICCTABS 87.6 02/21/2011   No results found for: KPAFRELGTCHN, LAMBDASER, KAPLAMBRATIO No results found for: IGGSERUM, IGA, IGMSERUM No results found for: Odetta Pink, SPEI   Chemistry      Component Value Date/Time   NA 143 04/03/2017 1526   NA 139 04/12/2016 1113   K 4.1 04/03/2017 1526   K 4.1 04/12/2016 1113   CL 105 04/03/2017 1526   CO2 29 04/03/2017 1526   CO2 22 04/12/2016 1113   BUN 16 04/03/2017 1526   BUN 16.3 04/12/2016 1113   CREATININE 1.0 04/03/2017 1526   CREATININE 0.9 04/12/2016 1113      Component Value Date/Time   CALCIUM 8.8 04/03/2017 1526   CALCIUM 9.1 04/12/2016 1113   ALKPHOS 79 04/03/2017 1526   ALKPHOS 66 04/12/2016 1113   AST 47 (H) 04/03/2017 1526   AST 31 04/12/2016 1113   ALT 61 (H) 04/03/2017 1526   ALT 36 04/12/2016 1113   BILITOT 0.80 04/03/2017 1526   BILITOT 0.73 04/12/2016 1113      Impression and Plan: Mr. Eberlin is a very pleasant 61 yo caucasian gentleman with polycythemia vera, JAK2 +. His Hct today is 43.0 so no phlebotomy needed this visit.  His platelet count has significantly increased to 961 since his last visit. We will changes in hydrea to 1500 mg PO daily and plan to see him back for follow-up and repeat lab work in 2 weeks. He verbalized understanding and is in agreement with the plan.  He is symptomatic with fatigue, palpitations and SOB with over exertion.  We will see what his iron studies show.  He promises to contact our office with any questions or concerns. We can certainly see him sooner if need be.   Eliezer Bottom, NP 11/28/20183:58 PM

## 2017-04-04 LAB — IRON AND TIBC
%SAT: 8 % — ABNORMAL LOW (ref 20–55)
Iron: 39 ug/dL — ABNORMAL LOW (ref 42–163)
TIBC: 506 ug/dL — ABNORMAL HIGH (ref 202–409)
UIBC: 468 ug/dL — ABNORMAL HIGH (ref 117–376)

## 2017-04-04 LAB — LACTATE DEHYDROGENASE: LDH: 681 U/L — ABNORMAL HIGH (ref 125–245)

## 2017-04-04 LAB — FERRITIN: Ferritin: 11 ng/ml — ABNORMAL LOW (ref 22–316)

## 2017-04-17 ENCOUNTER — Other Ambulatory Visit: Payer: Managed Care, Other (non HMO)

## 2017-04-17 ENCOUNTER — Ambulatory Visit: Payer: Managed Care, Other (non HMO) | Admitting: Hematology & Oncology

## 2017-04-25 ENCOUNTER — Ambulatory Visit (HOSPITAL_BASED_OUTPATIENT_CLINIC_OR_DEPARTMENT_OTHER): Payer: Managed Care, Other (non HMO) | Admitting: Family

## 2017-04-25 ENCOUNTER — Other Ambulatory Visit: Payer: Managed Care, Other (non HMO)

## 2017-04-25 ENCOUNTER — Other Ambulatory Visit: Payer: Self-pay

## 2017-04-25 ENCOUNTER — Encounter: Payer: Self-pay | Admitting: Family

## 2017-04-25 VITALS — BP 120/75 | HR 69 | Temp 98.3°F | Resp 16 | Wt 207.0 lb

## 2017-04-25 DIAGNOSIS — D500 Iron deficiency anemia secondary to blood loss (chronic): Secondary | ICD-10-CM

## 2017-04-25 DIAGNOSIS — D45 Polycythemia vera: Secondary | ICD-10-CM

## 2017-04-25 DIAGNOSIS — R5383 Other fatigue: Secondary | ICD-10-CM

## 2017-04-25 DIAGNOSIS — D751 Secondary polycythemia: Secondary | ICD-10-CM

## 2017-04-25 LAB — CBC WITH DIFFERENTIAL (CANCER CENTER ONLY)
BASO#: 0.3 10*3/uL — ABNORMAL HIGH (ref 0.0–0.2)
BASO%: 4.9 % — ABNORMAL HIGH (ref 0.0–2.0)
EOS%: 3.7 % (ref 0.0–7.0)
Eosinophils Absolute: 0.2 10*3/uL (ref 0.0–0.5)
HCT: 39.8 % (ref 38.7–49.9)
HGB: 13.4 g/dL (ref 13.0–17.1)
LYMPH#: 1.1 10*3/uL (ref 0.9–3.3)
LYMPH%: 19.1 % (ref 14.0–48.0)
MCH: 30.6 pg (ref 28.0–33.4)
MCHC: 33.7 g/dL (ref 32.0–35.9)
MCV: 91 fL (ref 82–98)
MONO#: 0.8 10*3/uL (ref 0.1–0.9)
MONO%: 12.6 % (ref 0.0–13.0)
NEUT#: 3.6 10*3/uL (ref 1.5–6.5)
NEUT%: 59.7 % (ref 40.0–80.0)
Platelets: 651 10*3/uL — ABNORMAL HIGH (ref 145–400)
RBC: 4.38 10*6/uL (ref 4.20–5.70)
RDW: 21.4 % — ABNORMAL HIGH (ref 11.1–15.7)
WBC: 6 10*3/uL (ref 4.0–10.0)

## 2017-04-25 LAB — CMP (CANCER CENTER ONLY)
ALT(SGPT): 58 U/L — ABNORMAL HIGH (ref 10–47)
AST: 39 U/L — ABNORMAL HIGH (ref 11–38)
Albumin: 3.8 g/dL (ref 3.3–5.5)
Alkaline Phosphatase: 73 U/L (ref 26–84)
BUN, Bld: 17 mg/dL (ref 7–22)
CO2: 29 mEq/L (ref 18–33)
Calcium: 9.5 mg/dL (ref 8.0–10.3)
Chloride: 105 mEq/L (ref 98–108)
Creat: 1.2 mg/dl (ref 0.6–1.2)
Glucose, Bld: 95 mg/dL (ref 73–118)
Potassium: 4.2 mEq/L (ref 3.3–4.7)
Sodium: 144 mEq/L (ref 128–145)
Total Bilirubin: 0.9 mg/dl (ref 0.20–1.60)
Total Protein: 6.4 g/dL (ref 6.4–8.1)

## 2017-04-25 NOTE — Progress Notes (Signed)
Hematology and Oncology Follow Up Visit  [name] [name] [id number] Nov 30, 1955 61 y.o. 04/25/2017   Principle Diagnosis:  Polycythemia vera- JAK2 (+)  Current Therapy:   Phlebotomy to maintain hematocrit below 45% Hydrea 1500 mg by mouth daily - decreased back down to 1000 mg PO daily today 04/25/17 Aspirin 81 mg by mouth daily   Interim History:  [name] [name] is here today for follow-up. He is doing fairly well but with the increase in Hydrea last month has also had an increase in his fatigue. No anemia, platelet count is down to 651 and WBC count is 6.0.  He has had no issue with infections. No fever, chills, n/v, cough, rash, dizziness, chest pain, abdominal pain or changes in bowel or bladder habits.  He has had some sinus congestions and headaches and plans to follow-up with his PCP if this persists.  He has occasional palpitations and SOB with over exertion and has seen cardiology (Dr. Stanford Breed) in the past. This is unchanged.  No episodes of bleeding, bruising or petechiae. No lymphadenopathy found on exam.  The numbness and tingling in his hands and feet is unchanged. No swelling or tenderness in his extremities at this time.  He has a good appetite and is staying well hydrated. His weight is stable.   ECOG Performance Status: 1 - Symptomatic but completely ambulatory  Medications:  Allergies as of 04/25/2017   No Known Allergies     Medication List        Accurate as of 04/25/17  3:52 PM. Always use your most recent med list.          acetaminophen 500 MG tablet Commonly known as:  TYLENOL Take 1,000 mg by mouth every 6 (six) hours as needed. For pain   aspirin 81 MG tablet Take 81 mg by mouth daily.   buPROPion 150 MG 12 hr tablet Commonly known as:  WELLBUTRIN SR Take 150 mg by mouth every morning.   clidinium-chlordiazePOXIDE 5-2.5 MG capsule Commonly known as:  LIBRAX TAKE 1 CAPSULE BY MOUTH AS NEEDED every 12 hrs   cloNIDine 0.1 MG tablet Commonly  known as:  CATAPRES Take 1 tablet (0.1 mg total) by mouth daily.   fluorouracil 5 % cream Commonly known as:  EFUDEX   hydroxyurea 500 MG capsule Commonly known as:  HYDREA TAKE 3 CAPSULES BY MOUTH DAILY. MAY TAKE WITH FOOD TO MINIMIZE GI SIDE EFFECTS   hyoscyamine 0.375 MG 12 hr tablet Commonly known as:  LEVBID Take 1 tablet (0.375 mg total) by mouth as needed.   propranolol ER 120 MG 24 hr capsule Commonly known as:  INDERAL LA TAKE ONE CAPSULE BY MOUTH EVERY DAY   tamsulosin 0.4 MG Caps capsule Commonly known as:  FLOMAX Take 0.4 mg by mouth daily.   zolpidem 12.5 MG CR tablet Commonly known as:  AMBIEN CR Take by mouth as needed. For sleep pt usually takes 1/2 tablet       Allergies: No Known Allergies  Past Medical History, Surgical history, Social history, and Family History were reviewed and updated.  Review of Systems: All other 10 point review of systems is negative.   Physical Exam:  weight is 207 lb (93.9 kg). His oral temperature is 98.3 F (36.8 C). His blood pressure is 120/75 and his pulse is 69. His respiration is 16 and oxygen saturation is 97%.   Wt Readings from Last 3 Encounters:  04/25/17 207 lb (93.9 kg)  04/03/17 204 lb (92.5 kg)  03/06/17 205  lb (93 kg)    Ocular: Sclerae unicteric, pupils equal, round and reactive to light Ear-nose-throat: Oropharynx clear, dentition fair Lymphatic: No cervical, supraclavicular or axillary adenopathy Lungs no rales or rhonchi, good excursion bilaterally Heart regular rate and rhythm, no murmur appreciated Abd soft, nontender, positive bowel sounds, no liver or spleen tip palpated on exam, no fluid wave  MSK no focal spinal tenderness, no joint edema Neuro: non-focal, well-oriented, appropriate affect Breasts: Deferred   Lab Results  Component Value Date   WBC 6.0 04/25/2017   HGB 13.4 04/25/2017   HCT 39.8 04/25/2017   MCV 91 04/25/2017   PLT 651 (H) 04/25/2017   Lab Results  Component Value  Date   FERRITIN 11 (L) 04/03/2017   IRON 39 (L) 04/03/2017   TIBC 506 (H) 04/03/2017   UIBC 468 (H) 04/03/2017   IRONPCTSAT 8 (L) 04/03/2017   Lab Results  Component Value Date   RETICCTPCT 1.4 02/21/2011   RBC 4.38 04/25/2017   RETICCTABS 87.6 02/21/2011   No results found for: KPAFRELGTCHN, LAMBDASER, KAPLAMBRATIO No results found for: IGGSERUM, IGA, IGMSERUM No results found for: Odetta Pink, SPEI   Chemistry      Component Value Date/Time   NA 143 04/03/2017 1526   NA 139 04/12/2016 1113   K 4.1 04/03/2017 1526   K 4.1 04/12/2016 1113   CL 105 04/03/2017 1526   CO2 29 04/03/2017 1526   CO2 22 04/12/2016 1113   BUN 16 04/03/2017 1526   BUN 16.3 04/12/2016 1113   CREATININE 1.0 04/03/2017 1526   CREATININE 0.9 04/12/2016 1113      Component Value Date/Time   CALCIUM 8.8 04/03/2017 1526   CALCIUM 9.1 04/12/2016 1113   ALKPHOS 79 04/03/2017 1526   ALKPHOS 66 04/12/2016 1113   AST 47 (H) 04/03/2017 1526   AST 31 04/12/2016 1113   ALT 61 (H) 04/03/2017 1526   ALT 36 04/12/2016 1113   BILITOT 0.80 04/03/2017 1526   BILITOT 0.73 04/12/2016 1113      Impression and Plan: [name] [name] is a very pleasant 61 yo caucasian gentleman with polycythemia vera, JAK2 +. He is feeling quite fatigued on the 1500 mg of Hydrea daily. His Hgb is 13.4, WBC count 6.0 and platelet count is 651.  I spoke with Dr. Marin Olp and we will reduce his Hydrea back down to 1,000mg  PO daily. He is taking his daily aspirin as prescribed.  We will plan to see him back again in another 6 weeks for follow-up.  He will contact our office with any questions or concerns. We can certainly see him sooner if need be.   Laverna Peace, NP 12/20/20183:52 PM

## 2017-04-26 LAB — LACTATE DEHYDROGENASE: LDH: 602 U/L — ABNORMAL HIGH (ref 125–245)

## 2017-04-26 LAB — IRON AND TIBC
%SAT: 18 % — ABNORMAL LOW (ref 20–55)
Iron: 79 ug/dL (ref 42–163)
TIBC: 435 ug/dL — ABNORMAL HIGH (ref 202–409)
UIBC: 356 ug/dL (ref 117–376)

## 2017-04-26 LAB — FERRITIN: Ferritin: 19 ng/ml — ABNORMAL LOW (ref 22–316)

## 2017-05-20 DIAGNOSIS — M79641 Pain in right hand: Secondary | ICD-10-CM | POA: Insufficient documentation

## 2017-06-06 ENCOUNTER — Other Ambulatory Visit: Payer: Self-pay

## 2017-06-06 ENCOUNTER — Inpatient Hospital Stay (HOSPITAL_BASED_OUTPATIENT_CLINIC_OR_DEPARTMENT_OTHER): Payer: Managed Care, Other (non HMO) | Admitting: Family

## 2017-06-06 ENCOUNTER — Inpatient Hospital Stay: Payer: Managed Care, Other (non HMO) | Attending: Hematology & Oncology

## 2017-06-06 VITALS — BP 150/83 | HR 67 | Temp 98.2°F | Resp 20 | Wt 205.8 lb

## 2017-06-06 DIAGNOSIS — R200 Anesthesia of skin: Secondary | ICD-10-CM | POA: Diagnosis not present

## 2017-06-06 DIAGNOSIS — Z7982 Long term (current) use of aspirin: Secondary | ICD-10-CM

## 2017-06-06 DIAGNOSIS — R202 Paresthesia of skin: Secondary | ICD-10-CM | POA: Diagnosis not present

## 2017-06-06 DIAGNOSIS — R51 Headache: Secondary | ICD-10-CM | POA: Diagnosis not present

## 2017-06-06 DIAGNOSIS — Z79899 Other long term (current) drug therapy: Secondary | ICD-10-CM | POA: Insufficient documentation

## 2017-06-06 DIAGNOSIS — R232 Flushing: Secondary | ICD-10-CM | POA: Diagnosis not present

## 2017-06-06 DIAGNOSIS — D751 Secondary polycythemia: Secondary | ICD-10-CM | POA: Insufficient documentation

## 2017-06-06 DIAGNOSIS — R6883 Chills (without fever): Secondary | ICD-10-CM | POA: Diagnosis not present

## 2017-06-06 DIAGNOSIS — D500 Iron deficiency anemia secondary to blood loss (chronic): Secondary | ICD-10-CM

## 2017-06-06 DIAGNOSIS — R5383 Other fatigue: Secondary | ICD-10-CM

## 2017-06-06 DIAGNOSIS — M791 Myalgia, unspecified site: Secondary | ICD-10-CM

## 2017-06-06 LAB — CBC WITH DIFFERENTIAL (CANCER CENTER ONLY)
Basophils Absolute: 0.2 10*3/uL — ABNORMAL HIGH (ref 0.0–0.1)
Basophils Relative: 3 %
Eosinophils Absolute: 0.2 10*3/uL (ref 0.0–0.5)
Eosinophils Relative: 3 %
HCT: 41.5 % (ref 38.7–49.9)
Hemoglobin: 14 g/dL (ref 13.0–17.1)
Lymphocytes Relative: 14 %
Lymphs Abs: 1 10*3/uL (ref 0.9–3.3)
MCH: 31.3 pg (ref 28.0–33.4)
MCHC: 33.7 g/dL (ref 32.0–35.9)
MCV: 92.6 fL (ref 82.0–98.0)
Monocytes Absolute: 0.7 10*3/uL (ref 0.1–0.9)
Monocytes Relative: 9 %
Neutro Abs: 5.1 10*3/uL (ref 1.5–6.5)
Neutrophils Relative %: 71 %
Platelet Count: 364 10*3/uL (ref 145–400)
RBC: 4.48 MIL/uL (ref 4.20–5.70)
RDW: 19.5 % — ABNORMAL HIGH (ref 11.1–15.7)
WBC Count: 7.2 10*3/uL (ref 4.0–10.0)

## 2017-06-06 LAB — CMP (CANCER CENTER ONLY)
ALT: 29 U/L (ref 0–55)
AST: 34 U/L (ref 5–34)
Albumin: 4.2 g/dL (ref 3.5–5.0)
Alkaline Phosphatase: 61 U/L (ref 26–84)
Anion gap: 7 (ref 5–15)
BUN: 13 mg/dL (ref 7–22)
CO2: 30 mmol/L (ref 18–33)
Calcium: 8.9 mg/dL (ref 8.0–10.3)
Chloride: 108 mmol/L (ref 98–108)
Creatinine: 0.8 mg/dL (ref 0.70–1.30)
Glucose, Bld: 87 mg/dL (ref 73–118)
Potassium: 3.7 mmol/L (ref 3.3–4.7)
Sodium: 145 mmol/L (ref 128–145)
Total Bilirubin: 1.2 mg/dL (ref 0.2–1.2)
Total Protein: 6.8 g/dL (ref 6.4–8.1)

## 2017-06-06 NOTE — Progress Notes (Signed)
Hematology and Oncology Follow Up Visit  [name] [name] [id number] 16-Oct-1955 62 y.o. 06/06/2017   Principle Diagnosis:  Polycythemia vera- JAK2 (+)  Current Therapy:   Phlebotomy to maintain hematocrit below 45% Hydrea 1000 mg by mouth daily Aspirin 81 mg by mouth daily   Interim History:  [name] [name] is here today for follow-up. He is feeling a little fatigued at times and still having headaches in the evenings.  He fluctuates with chills and flushing with heat at times.  He is doing well on Hydrea and aspirin. Platelet count is 364, no anemia and WBC count is stable at 7.2.  No fever, n/v, cough, rash, dizziness, SOB, chest pain, palpitations, abdominal pain or changes in bowel or bladder habits.  No episodes of bleeding, no bruising or petechiae. No lymphadenopathy found on exam.  No swelling in his extremities. The numbness and tingling in his hands and feet is unchanged. He has generalized aches and pains in his joints. This waxes and wanes.  He has a good appetite and is staying well hydrated. His weight is stable.   ECOG Performance Status: 0 - Asymptomatic  Medications:  Allergies as of 06/06/2017   No Known Allergies     Medication List        Accurate as of 06/06/17  4:12 PM. Always use your most recent med list.          acetaminophen 500 MG tablet Commonly known as:  TYLENOL Take 1,000 mg by mouth every 6 (six) hours as needed. For pain   aspirin 81 MG tablet Take 81 mg by mouth daily.   buPROPion 150 MG 12 hr tablet Commonly known as:  WELLBUTRIN SR Take 150 mg by mouth every morning.   clidinium-chlordiazePOXIDE 5-2.5 MG capsule Commonly known as:  LIBRAX TAKE 1 CAPSULE BY MOUTH AS NEEDED every 12 hrs   cloNIDine 0.1 MG tablet Commonly known as:  CATAPRES Take 1 tablet (0.1 mg total) by mouth daily.   fluorouracil 5 % cream Commonly known as:  EFUDEX   hydroxyurea 500 MG capsule Commonly known as:  HYDREA TAKE 3 CAPSULES BY MOUTH DAILY. MAY  TAKE WITH FOOD TO MINIMIZE GI SIDE EFFECTS   hyoscyamine 0.375 MG 12 hr tablet Commonly known as:  LEVBID Take 1 tablet (0.375 mg total) by mouth as needed.   propranolol ER 120 MG 24 hr capsule Commonly known as:  INDERAL LA TAKE ONE CAPSULE BY MOUTH EVERY DAY   tamsulosin 0.4 MG Caps capsule Commonly known as:  FLOMAX Take 0.4 mg by mouth daily.   zolpidem 12.5 MG CR tablet Commonly known as:  AMBIEN CR Take by mouth as needed. For sleep pt usually takes 1/2 tablet       Allergies: No Known Allergies  Past Medical History, Surgical history, Social history, and Family History were reviewed and updated.  Review of Systems: All other 10 point review of systems is negative.   Physical Exam:  weight is 205 lb 12 oz (93.3 kg). His oral temperature is 98.2 F (36.8 C). His blood pressure is 150/83 (abnormal) and his pulse is 67. His respiration is 20 and oxygen saturation is 100%.   Wt Readings from Last 3 Encounters:  06/06/17 205 lb 12 oz (93.3 kg)  04/25/17 207 lb (93.9 kg)  04/03/17 204 lb (92.5 kg)    Ocular: Sclerae unicteric, pupils equal, round and reactive to light Ear-nose-throat: Oropharynx clear, dentition fair Lymphatic: No cervical, supraclavicular or axillary adenopathy Lungs no rales or rhonchi,  good excursion bilaterally Heart regular rate and rhythm, no murmur appreciated Abd soft, nontender, positive bowel sounds, no liver or spleen tip palpated on exam, no fluid wave  MSK no focal spinal tenderness, no joint edema Neuro: non-focal, well-oriented, appropriate affect Breasts: Deferred    Lab Results  Component Value Date   WBC 7.2 06/06/2017   HGB 13.4 04/25/2017   HCT 41.5 06/06/2017   MCV 92.6 06/06/2017   PLT 364 06/06/2017   Lab Results  Component Value Date   FERRITIN 19 (L) 04/25/2017   IRON 79 04/25/2017   TIBC 435 (H) 04/25/2017   UIBC 356 04/25/2017   IRONPCTSAT 18 (L) 04/25/2017   Lab Results  Component Value Date   RETICCTPCT  1.4 02/21/2011   RBC 4.48 06/06/2017   RETICCTABS 87.6 02/21/2011   No results found for: KPAFRELGTCHN, LAMBDASER, KAPLAMBRATIO No results found for: Kandis Cocking, IGMSERUM No results found for: Odetta Pink, SPEI   Chemistry      Component Value Date/Time   NA 145 06/06/2017 1514   NA 144 04/25/2017 1506   NA 139 04/12/2016 1113   K 3.7 06/06/2017 1514   K 4.2 04/25/2017 1506   K 4.1 04/12/2016 1113   CL 108 06/06/2017 1514   CL 105 04/25/2017 1506   CO2 30 06/06/2017 1514   CO2 29 04/25/2017 1506   CO2 22 04/12/2016 1113   BUN 13 06/06/2017 1514   BUN 17 04/25/2017 1506   BUN 16.3 04/12/2016 1113   CREATININE 1.2 04/25/2017 1506   CREATININE 0.9 04/12/2016 1113      Component Value Date/Time   CALCIUM 8.9 06/06/2017 1514   CALCIUM 9.5 04/25/2017 1506   CALCIUM 9.1 04/12/2016 1113   ALKPHOS 61 06/06/2017 1514   ALKPHOS 73 04/25/2017 1506   ALKPHOS 66 04/12/2016 1113   AST 34 06/06/2017 1514   AST 31 04/12/2016 1113   ALT 29 06/06/2017 1514   ALT 58 (H) 04/25/2017 1506   ALT 36 04/12/2016 1113   BILITOT 1.2 06/06/2017 1514   BILITOT 0.73 04/12/2016 1113      Impression and Plan: [name] [name] is a very pleasant 62 yo caucasian gentleman with polycythemia, JAK-2 positive. His Hct today is 41.5% so no phlebotomy needed at this time.  He has done well on Hydrea 1000 mg PO daily and aspirin daily. His platelet count is 364, WBC count is 7.2 and no anemia.  We will plan to see him back in another 8 weeks for repeat lab work and follow-up.  He will contact our office with any questions or concerns. We can certainly see him sooner if need be.   Laverna Peace, NP 1/31/20194:12 PM

## 2017-06-07 LAB — FERRITIN: Ferritin: 11 ng/mL — ABNORMAL LOW (ref 22–316)

## 2017-06-07 LAB — IRON AND TIBC
Iron: 52 ug/dL (ref 42–163)
Saturation Ratios: 11 % — ABNORMAL LOW (ref 42–163)
TIBC: 455 ug/dL — ABNORMAL HIGH (ref 202–409)
UIBC: 404 ug/dL

## 2017-06-07 LAB — LACTATE DEHYDROGENASE: LDH: 350 U/L — ABNORMAL HIGH (ref 125–245)

## 2017-07-15 DIAGNOSIS — G5603 Carpal tunnel syndrome, bilateral upper limbs: Secondary | ICD-10-CM | POA: Insufficient documentation

## 2017-09-03 ENCOUNTER — Other Ambulatory Visit: Payer: Self-pay | Admitting: Hematology & Oncology

## 2017-09-06 ENCOUNTER — Inpatient Hospital Stay: Payer: Managed Care, Other (non HMO)

## 2017-09-06 ENCOUNTER — Inpatient Hospital Stay: Payer: Managed Care, Other (non HMO) | Admitting: Hematology & Oncology

## 2017-09-26 ENCOUNTER — Inpatient Hospital Stay (HOSPITAL_BASED_OUTPATIENT_CLINIC_OR_DEPARTMENT_OTHER): Payer: Managed Care, Other (non HMO) | Admitting: Hematology & Oncology

## 2017-09-26 ENCOUNTER — Other Ambulatory Visit: Payer: Self-pay

## 2017-09-26 ENCOUNTER — Other Ambulatory Visit: Payer: Self-pay | Admitting: Family

## 2017-09-26 ENCOUNTER — Inpatient Hospital Stay: Payer: Managed Care, Other (non HMO) | Attending: Hematology & Oncology

## 2017-09-26 ENCOUNTER — Encounter: Payer: Self-pay | Admitting: Hematology & Oncology

## 2017-09-26 ENCOUNTER — Inpatient Hospital Stay: Payer: Managed Care, Other (non HMO)

## 2017-09-26 VITALS — BP 125/82 | HR 71 | Temp 97.9°F | Resp 16 | Wt 211.0 lb

## 2017-09-26 VITALS — BP 129/81 | HR 58

## 2017-09-26 DIAGNOSIS — R5381 Other malaise: Secondary | ICD-10-CM | POA: Insufficient documentation

## 2017-09-26 DIAGNOSIS — Z7982 Long term (current) use of aspirin: Secondary | ICD-10-CM | POA: Insufficient documentation

## 2017-09-26 DIAGNOSIS — D500 Iron deficiency anemia secondary to blood loss (chronic): Secondary | ICD-10-CM

## 2017-09-26 DIAGNOSIS — D751 Secondary polycythemia: Secondary | ICD-10-CM

## 2017-09-26 DIAGNOSIS — R5383 Other fatigue: Secondary | ICD-10-CM | POA: Diagnosis not present

## 2017-09-26 DIAGNOSIS — D45 Polycythemia vera: Secondary | ICD-10-CM | POA: Insufficient documentation

## 2017-09-26 DIAGNOSIS — Z79899 Other long term (current) drug therapy: Secondary | ICD-10-CM | POA: Insufficient documentation

## 2017-09-26 DIAGNOSIS — M791 Myalgia, unspecified site: Secondary | ICD-10-CM | POA: Diagnosis not present

## 2017-09-26 DIAGNOSIS — E611 Iron deficiency: Secondary | ICD-10-CM | POA: Diagnosis not present

## 2017-09-26 HISTORY — DX: Iron deficiency anemia secondary to blood loss (chronic): D50.0

## 2017-09-26 LAB — CMP (CANCER CENTER ONLY)
ALT: 27 U/L (ref 0–55)
AST: 34 U/L (ref 5–34)
Albumin: 4.5 g/dL (ref 3.5–5.0)
Alkaline Phosphatase: 65 U/L (ref 40–150)
Anion gap: 7 (ref 3–11)
BUN: 16 mg/dL (ref 7–26)
CO2: 26 mmol/L (ref 22–29)
Calcium: 9.3 mg/dL (ref 8.4–10.4)
Chloride: 107 mmol/L (ref 98–109)
Creatinine: 1.05 mg/dL (ref 0.70–1.30)
GFR, Est AFR Am: >60 mL/min (ref >60)
GFR, Estimated: >60 mL/min (ref >60)
Glucose, Bld: 94 mg/dL (ref 70–140)
Potassium: 4.2 mmol/L (ref 3.5–5.1)
Sodium: 140 mmol/L (ref 136–145)
Total Bilirubin: 0.9 mg/dL (ref 0.2–1.2)
Total Protein: 6.9 g/dL (ref 6.4–8.3)

## 2017-09-26 LAB — CBC WITH DIFFERENTIAL (CANCER CENTER ONLY)
Basophils Absolute: 0.3 10*3/uL — ABNORMAL HIGH (ref 0.0–0.1)
Basophils Relative: 3 %
Eosinophils Absolute: 0.5 10*3/uL (ref 0.0–0.5)
Eosinophils Relative: 5 %
HCT: 47.6 % (ref 38.7–49.9)
Hemoglobin: 15.7 g/dL (ref 13.0–17.1)
Lymphocytes Relative: 12 %
Lymphs Abs: 1.2 10*3/uL (ref 0.9–3.3)
MCH: 30 pg (ref 28.0–33.4)
MCHC: 33 g/dL (ref 32.0–35.9)
MCV: 90.8 fL (ref 82.0–98.0)
Monocytes Absolute: 0.9 10*3/uL (ref 0.1–0.9)
Monocytes Relative: 9 %
Neutro Abs: 7.2 10*3/uL — ABNORMAL HIGH (ref 1.5–6.5)
Neutrophils Relative %: 71 %
Platelet Count: 285 10*3/uL (ref 145–400)
RBC: 5.24 MIL/uL (ref 4.20–5.70)
RDW: 17.1 % — ABNORMAL HIGH (ref 11.1–15.7)
WBC Count: 10 10*3/uL (ref 4.0–10.0)

## 2017-09-26 LAB — FERRITIN: Ferritin: 12 ng/mL — ABNORMAL LOW (ref 22–316)

## 2017-09-26 LAB — IRON AND TIBC
Iron: 80 ug/dL (ref 42–163)
Saturation Ratios: 18 % — ABNORMAL LOW (ref 42–163)
TIBC: 451 ug/dL — ABNORMAL HIGH (ref 202–409)
UIBC: 372 ug/dL

## 2017-09-26 LAB — LACTATE DEHYDROGENASE: LDH: 355 U/L — ABNORMAL HIGH (ref 125–245)

## 2017-09-26 NOTE — Progress Notes (Signed)
Hematology and Oncology Follow Up Visit  [name] [name] [id number] [birth year] 62 y.o. 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     Interim History:  Mr.  [name] [name] is back for followup.  Apparently, there was a scheduling issue.  We have not seen him for 4 months.  He is not feeling all that well.  He just feels tired.  He does not have a lot of energy.  He feels fatigued.  I am sure that a lot of this is from him being phlebotomized.  I am sure that his iron levels are on the low side.  When we checked his iron levels back in March, his ferritin was 11 with iron saturation of only 11%.  I think that he would benefit from a dose of IV iron.  I realize that he has the polycythemia but symptomatically, he is more bothered by iron deficiency.  There is been no nausea or vomiting.  He still working.  He is quite busy with family issues.  He has had no palpitations.  Overall, his performance status is ECOG 0.    Medications:  Current Outpatient Medications:  .  acetaminophen (TYLENOL) 500 MG tablet, Take 1,000 mg by mouth every 6 (six) hours as needed. For pain , Disp: , Rfl:  .  aspirin 81 MG tablet, Take 81 mg by mouth daily.  , Disp: , Rfl:  .  buPROPion (WELLBUTRIN SR) 150 MG 12 hr tablet, Take 150 mg by mouth every morning., Disp: , Rfl: 12 .  clidinium-chlordiazePOXIDE (LIBRAX) 5-2.5 MG capsule, TAKE 1 CAPSULE BY MOUTH AS NEEDED every 12 hrs, Disp: 60 capsule, Rfl: 2 .  cloNIDine (CATAPRES) 0.1 MG tablet, Take 1 tablet (0.1 mg total) by mouth daily., Disp: 30 tablet, Rfl: 11 .  fluorouracil (EFUDEX) 5 % cream, , Disp: , Rfl:  .  hydroxyurea (HYDREA) 500 MG capsule, TAKE 3 CAPSULES BY MOUTH DAILY. MAY TAKE WITH FOOD TO MINIMIZE GI  SIDE EFFECTS, Disp: 90 capsule, Rfl: 5 .  hyoscyamine (LEVBID) 0.375 MG 12 hr tablet, Take 1 tablet (0.375 mg total) by mouth as needed., Disp: 60 tablet, Rfl: 4 .  propranolol ER (INDERAL LA) 120 MG 24 hr capsule, TAKE ONE CAPSULE BY MOUTH EVERY DAY, Disp: 30 capsule, Rfl: 4 .  tamsulosin (FLOMAX) 0.4 MG CAPS capsule, Take 0.4 mg by mouth daily., Disp: , Rfl: 6 .  zolpidem (AMBIEN CR) 12.5 MG CR tablet, Take by mouth as needed. For sleep pt usually takes 1/2 tablet, Disp: , Rfl:   Allergies: No Known Allergies  Past Medical History, Surgical history, Social history, and Family History were reviewed and updated.  Review of Systems: Review of Systems  Constitutional: Positive for malaise/fatigue.  HENT: Negative.   Eyes: Negative.   Respiratory: Negative.   Cardiovascular: Negative.   Gastrointestinal: Negative.   Genitourinary: Negative.   Musculoskeletal: Positive for myalgias.  Skin: Negative.   Neurological: Negative.   Endo/Heme/Allergies: Negative.   Psychiatric/Behavioral: Negative.      Physical Exam:  weight is 211 lb (95.7 kg). His oral temperature is 97.9 F (36.6 C). His blood pressure is 125/82 and his pulse is 71. His respiration is 16 and oxygen saturation is 96%.   Physical Exam  Constitutional:  He is oriented to person, place, and time.  HENT:  Head: Normocephalic and atraumatic.  Mouth/Throat: Oropharynx is clear and moist.  Eyes: Pupils are equal, round, and reactive to light. EOM are normal.  Neck: Normal range of motion.  Cardiovascular: Normal rate, regular rhythm and normal heart sounds.  Pulmonary/Chest: Effort normal and breath sounds normal.  Abdominal: Soft. Bowel sounds are normal.  Musculoskeletal: Normal range of motion. He exhibits no edema, tenderness or deformity.  Lymphadenopathy:    He has no cervical adenopathy.  Neurological: He is alert and oriented to person, place, and time.  Skin: Skin is warm and dry. No rash noted. No erythema.    Psychiatric: He has a normal mood and affect. His behavior is normal. Judgment and thought content normal.  Vitals reviewed.    Lab Results  Component Value Date   WBC 10.0 09/26/2017   HGB 15.7 09/26/2017   HCT 47.6 09/26/2017   MCV 90.8 09/26/2017   PLT 285 09/26/2017     Chemistry      Component Value Date/Time   NA 145 06/06/2017 1514   NA 144 04/25/2017 1506   NA 139 04/12/2016 1113   K 3.7 06/06/2017 1514   K 4.2 04/25/2017 1506   K 4.1 04/12/2016 1113   CL 108 06/06/2017 1514   CL 105 04/25/2017 1506   CO2 30 06/06/2017 1514   CO2 29 04/25/2017 1506   CO2 22 04/12/2016 1113   BUN 13 06/06/2017 1514   BUN 17 04/25/2017 1506   BUN 16.3 04/12/2016 1113   CREATININE 0.80 06/06/2017 1514   CREATININE 1.2 04/25/2017 1506   CREATININE 0.9 04/12/2016 1113      Component Value Date/Time   CALCIUM 8.9 06/06/2017 1514   CALCIUM 9.5 04/25/2017 1506   CALCIUM 9.1 04/12/2016 1113   ALKPHOS 61 06/06/2017 1514   ALKPHOS 73 04/25/2017 1506   ALKPHOS 66 04/12/2016 1113   AST 34 06/06/2017 1514   AST 31 04/12/2016 1113   ALT 29 06/06/2017 1514   ALT 58 (H) 04/25/2017 1506   ALT 36 04/12/2016 1113   BILITOT 1.2 06/06/2017 1514   BILITOT 0.73 04/12/2016 1113         Impression and Plan: [name] [name] is 62 year old gentleman with polycythemia vera.  We will have to phlebotomize him today.  We will go ahead and give him a dose of iron today.  I want to see him back in 6 weeks.  I think this is reasonable.  Hopefully, he will be feeling better.Marland Kitchen     [name], MD 5/23/201910:59 AM

## 2017-09-26 NOTE — Progress Notes (Signed)
[name] [name] presents today for phlebotomy per MD orders. Phlebotomy procedure started at 1037 and ended at 1048 with 550 grams removed via 16 G to [place]. Patient observed for 30 minutes after procedure without any incident. Patient tolerated procedure well; diet and nutrition offered.

## 2017-10-17 ENCOUNTER — Inpatient Hospital Stay: Payer: Managed Care, Other (non HMO)

## 2017-10-18 ENCOUNTER — Other Ambulatory Visit: Payer: Self-pay

## 2017-10-18 ENCOUNTER — Inpatient Hospital Stay: Payer: Managed Care, Other (non HMO) | Attending: Hematology & Oncology

## 2017-10-18 VITALS — BP 117/74 | HR 62 | Temp 98.0°F | Resp 16

## 2017-10-18 DIAGNOSIS — D45 Polycythemia vera: Secondary | ICD-10-CM | POA: Diagnosis present

## 2017-10-18 DIAGNOSIS — D500 Iron deficiency anemia secondary to blood loss (chronic): Secondary | ICD-10-CM

## 2017-10-18 DIAGNOSIS — E611 Iron deficiency: Secondary | ICD-10-CM | POA: Diagnosis not present

## 2017-10-18 DIAGNOSIS — D751 Secondary polycythemia: Secondary | ICD-10-CM

## 2017-10-18 MED ORDER — SODIUM CHLORIDE 0.9 % IV SOLN
510.0000 mg | Freq: Once | INTRAVENOUS | Status: AC
  Administered 2017-10-18: 510 mg via INTRAVENOUS
  Filled 2017-10-18: qty 17

## 2017-10-18 MED ORDER — SODIUM CHLORIDE 0.9 % IV SOLN
Freq: Once | INTRAVENOUS | Status: AC
Start: 2017-10-18 — End: 2017-10-18
  Administered 2017-10-18: 15:00:00 via INTRAVENOUS

## 2017-10-18 NOTE — Patient Instructions (Signed)

## 2017-11-13 ENCOUNTER — Inpatient Hospital Stay (HOSPITAL_BASED_OUTPATIENT_CLINIC_OR_DEPARTMENT_OTHER): Payer: Managed Care, Other (non HMO) | Admitting: Hematology & Oncology

## 2017-11-13 ENCOUNTER — Encounter: Payer: Self-pay | Admitting: Hematology & Oncology

## 2017-11-13 ENCOUNTER — Other Ambulatory Visit: Payer: Self-pay

## 2017-11-13 ENCOUNTER — Inpatient Hospital Stay: Payer: Managed Care, Other (non HMO)

## 2017-11-13 ENCOUNTER — Inpatient Hospital Stay: Payer: Managed Care, Other (non HMO) | Attending: Hematology & Oncology

## 2017-11-13 VITALS — BP 127/81 | HR 61 | Temp 98.4°F | Resp 20 | Wt 210.8 lb

## 2017-11-13 VITALS — BP 145/72 | HR 64 | Resp 20

## 2017-11-13 DIAGNOSIS — R5381 Other malaise: Secondary | ICD-10-CM | POA: Diagnosis not present

## 2017-11-13 DIAGNOSIS — Z79899 Other long term (current) drug therapy: Secondary | ICD-10-CM | POA: Diagnosis not present

## 2017-11-13 DIAGNOSIS — D45 Polycythemia vera: Secondary | ICD-10-CM | POA: Diagnosis present

## 2017-11-13 DIAGNOSIS — D500 Iron deficiency anemia secondary to blood loss (chronic): Secondary | ICD-10-CM

## 2017-11-13 DIAGNOSIS — Z7982 Long term (current) use of aspirin: Secondary | ICD-10-CM | POA: Insufficient documentation

## 2017-11-13 DIAGNOSIS — D751 Secondary polycythemia: Secondary | ICD-10-CM

## 2017-11-13 DIAGNOSIS — M791 Myalgia, unspecified site: Secondary | ICD-10-CM | POA: Diagnosis not present

## 2017-11-13 DIAGNOSIS — R5383 Other fatigue: Secondary | ICD-10-CM | POA: Diagnosis not present

## 2017-11-13 LAB — CMP (CANCER CENTER ONLY)
ALT: 39 U/L (ref 10–47)
AST: 34 U/L (ref 11–38)
Albumin: 3.7 g/dL (ref 3.5–5.0)
Alkaline Phosphatase: 60 U/L (ref 26–84)
Anion gap: 8 (ref 5–15)
BUN: 14 mg/dL (ref 7–22)
CO2: 30 mmol/L (ref 18–33)
Calcium: 9.3 mg/dL (ref 8.0–10.3)
Chloride: 104 mmol/L (ref 98–108)
Creatinine: 1 mg/dL (ref 0.60–1.20)
Glucose, Bld: 103 mg/dL (ref 73–118)
Potassium: 4 mmol/L (ref 3.3–4.7)
Sodium: 142 mmol/L (ref 128–145)
Total Bilirubin: 0.9 mg/dL (ref 0.2–1.6)
Total Protein: 6.2 g/dL — ABNORMAL LOW (ref 6.4–8.1)

## 2017-11-13 LAB — CBC WITH DIFFERENTIAL (CANCER CENTER ONLY)
Basophils Absolute: 0.2 10*3/uL — ABNORMAL HIGH (ref 0.0–0.1)
Basophils Relative: 3 %
Eosinophils Absolute: 0.4 10*3/uL (ref 0.0–0.5)
Eosinophils Relative: 4 %
HCT: 46.7 % (ref 38.7–49.9)
Hemoglobin: 15.5 g/dL (ref 13.0–17.1)
Lymphocytes Relative: 12 %
Lymphs Abs: 1.1 10*3/uL (ref 0.9–3.3)
MCH: 30.8 pg (ref 28.0–33.4)
MCHC: 33.2 g/dL (ref 32.0–35.9)
MCV: 92.7 fL (ref 82.0–98.0)
Monocytes Absolute: 0.6 10*3/uL (ref 0.1–0.9)
Monocytes Relative: 7 %
Neutro Abs: 7 10*3/uL — ABNORMAL HIGH (ref 1.5–6.5)
Neutrophils Relative %: 74 %
Platelet Count: 464 10*3/uL — ABNORMAL HIGH (ref 145–400)
RBC: 5.04 MIL/uL (ref 4.20–5.70)
RDW: 19.6 % — ABNORMAL HIGH (ref 11.1–15.7)
WBC Count: 9.3 10*3/uL (ref 4.0–10.0)

## 2017-11-13 MED ORDER — SODIUM CHLORIDE 0.9% FLUSH
3.0000 mL | Freq: Once | INTRAVENOUS | Status: DC | PRN
  Filled 2017-11-13: qty 10

## 2017-11-13 MED ORDER — SODIUM CHLORIDE 0.9% FLUSH
10.0000 mL | INTRAVENOUS | Status: DC | PRN
  Filled 2017-11-13: qty 10

## 2017-11-13 MED ORDER — SODIUM CHLORIDE 0.9 % IV SOLN: Freq: Once | INTRAVENOUS | Status: DC

## 2017-11-13 MED ORDER — PROPRANOLOL HCL ER 80 MG PO CP24: 80.0000 mg | ORAL_CAPSULE | Freq: Every day | ORAL | 3 refills | Status: DC

## 2017-11-13 NOTE — Progress Notes (Signed)
Hematology and Oncology Follow Up Visit  [name] [name] [id number] 12-02-1955 62 y.o. 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     Interim History:  [name] [name] is back for followup.  He is still not feeling all that well.  I thought that if we gave him iron that would make him feel better.  We did give him iron.  However, that really did not help.  I just wonder if the hydroxyurea might be causing some of the issues.  I think for right now, we can stop the hydroxyurea.  I do not think that this would be an issue.  He will continue on the aspirin.  He is also on Inderal LA 120 mg daily dose.  May be, the beta-blocker is a little bit too strong for him.  As such, I will decrease the Inderal LA dose to 80 mg a day.  He has had no fever.  He has had no cough or shortness of breath.  He has complained of some abdominal discomfort.  This is in the upper abdomen.  I am going to see what a ultrasound shows.  He and his wife are planning on going out to [place] to see what other daughters.  I am sure that he will have a good time.  It might take him a day or so to get used to the altitude adjustment.  Overall, his performance status is ECOG 1.    Medications:  Current Outpatient Medications:  .  acetaminophen (TYLENOL) 500 MG tablet, Take 1,000 mg by mouth every 6 (six) hours as needed. For pain , Disp: , Rfl:  .  aspirin 81 MG tablet, Take 81 mg by mouth daily.  , Disp: , Rfl:  .  buPROPion (WELLBUTRIN SR) 150 MG 12 hr tablet, Take 150 mg by mouth every morning., Disp: , Rfl: 12 .  clidinium-chlordiazePOXIDE (LIBRAX) 5-2.5 MG capsule, TAKE 1 CAPSULE BY MOUTH AS NEEDED every 12 hrs, Disp: 60 capsule, Rfl: 2 .   cloNIDine (CATAPRES) 0.1 MG tablet, Take 1 tablet (0.1 mg total) by mouth daily., Disp: 30 tablet, Rfl: 11 .  fluorouracil (EFUDEX) 5 % cream, , Disp: , Rfl:  .  hydroxyurea (HYDREA) 500 MG capsule, TAKE 3 CAPSULES BY MOUTH DAILY. MAY TAKE WITH FOOD TO MINIMIZE GI SIDE EFFECTS (Patient taking differently: TAKE 2 CAPSULES BY MOUTH DAILY. MAY TAKE WITH FOOD TO MINIMIZE GI SIDE EFFECTS), Disp: 90 capsule, Rfl: 5 .  hyoscyamine (LEVBID) 0.375 MG 12 hr tablet, Take 1 tablet (0.375 mg total) by mouth as needed., Disp: 60 tablet, Rfl: 4 .  propranolol ER (INDERAL LA) 120 MG 24 hr capsule, TAKE ONE CAPSULE BY MOUTH EVERY DAY, Disp: 30 capsule, Rfl: 4 .  zolpidem (AMBIEN CR) 12.5 MG CR tablet, Take by mouth as needed. For sleep pt usually takes 1/2 tablet, Disp: , Rfl:  .  tamsulosin (FLOMAX) 0.4 MG CAPS capsule, Take 0.4 mg by mouth daily., Disp: , Rfl: 6 No current facility-administered medications for this visit.   Facility-Administered Medications Ordered in Other Visits:  .  0.9 %  sodium chloride infusion, , Intravenous, Once, [place], Sarah M, NP .  sodium chloride flush (NS) 0.9 % injection 10  mL, 10 mL, Intracatheter, PRN, [place], Sarah M, NP .  sodium chloride flush (NS) 0.9 % injection 3 mL, 3 mL, Intravenous, Once PRN, [place], Holli Humbles, NP  Allergies: No Known Allergies  Past Medical History, Surgical history, Social history, and Family History were reviewed and updated.  Review of Systems: Review of Systems  Constitutional: Positive for malaise/fatigue.  HENT: Negative.   Eyes: Negative.   Respiratory: Negative.   Cardiovascular: Negative.   Gastrointestinal: Negative.   Genitourinary: Negative.   Musculoskeletal: Positive for myalgias.  Skin: Negative.   Neurological: Negative.   Endo/Heme/Allergies: Negative.   Psychiatric/Behavioral: Negative.      Physical Exam:  weight is 210 lb 12.8 oz (95.6 kg). His oral temperature is 98.4 F (36.9 C). His blood pressure is  127/81 and his pulse is 61. His respiration is 20 and oxygen saturation is 97%.   Physical Exam  Constitutional: He is oriented to person, place, and time.  HENT:  Head: Normocephalic and atraumatic.  Mouth/Throat: Oropharynx is clear and moist.  Eyes: Pupils are equal, round, and reactive to light. EOM are normal.  Neck: Normal range of motion.  Cardiovascular: Normal rate, regular rhythm and normal heart sounds.  Pulmonary/Chest: Effort normal and breath sounds normal.  Abdominal: Soft. Bowel sounds are normal.  Musculoskeletal: Normal range of motion. He exhibits no edema, tenderness or deformity.  Lymphadenopathy:    He has no cervical adenopathy.  Neurological: He is alert and oriented to person, place, and time.  Skin: Skin is warm and dry. No rash noted. No erythema.  Psychiatric: He has a normal mood and affect. His behavior is normal. Judgment and thought content normal.  Vitals reviewed.    Lab Results  Component Value Date   WBC 9.3 11/13/2017   HGB 15.5 11/13/2017   HCT 46.7 11/13/2017   MCV 92.7 11/13/2017   PLT 464 (H) 11/13/2017     Chemistry      Component Value Date/Time   NA 142 11/13/2017 1523   NA 144 04/25/2017 1506   NA 139 04/12/2016 1113   K 4.0 11/13/2017 1523   K 4.2 04/25/2017 1506   K 4.1 04/12/2016 1113   CL 104 11/13/2017 1523   CL 105 04/25/2017 1506   CO2 30 11/13/2017 1523   CO2 29 04/25/2017 1506   CO2 22 04/12/2016 1113   BUN 14 11/13/2017 1523   BUN 17 04/25/2017 1506   BUN 16.3 04/12/2016 1113   CREATININE 1.00 11/13/2017 1523   CREATININE 1.2 04/25/2017 1506   CREATININE 0.9 04/12/2016 1113      Component Value Date/Time   CALCIUM 9.3 11/13/2017 1523   CALCIUM 9.5 04/25/2017 1506   CALCIUM 9.1 04/12/2016 1113   ALKPHOS 60 11/13/2017 1523   ALKPHOS 73 04/25/2017 1506   ALKPHOS 66 04/12/2016 1113   AST 34 11/13/2017 1523   AST 31 04/12/2016 1113   ALT 39 11/13/2017 1523   ALT 58 (H) 04/25/2017 1506   ALT 36 04/12/2016  1113   BILITOT 0.9 11/13/2017 1523   BILITOT 0.73 04/12/2016 1113         Impression and Plan: [name] [name] is 62 year old gentleman with polycythemia vera.  We will phlebotomize him today.  I think this would be a good idea given that he is going to go to [place].  Hopefully having his blood count a little bit lower will improve his performance status.  Again he will stop the Hydrea.  If his platelet count does go  up, we could always restart him on anagrelide.  We will see if the decrease dose of Inderal LA will help.  I will plan to get him back in 4 weeks.  We had to make sure we stay close in contact with him.      [name], MD 7/10/20194:47 PM

## 2017-11-13 NOTE — Progress Notes (Signed)
Abbe Amsterdam presents today for phlebotomy per MD orders. Phlebotomy procedure started at 1621 and ended at 1628. 529 cc removed via 16 G needle at R antecubital site. Patient tolerated procedure well.

## 2017-11-13 NOTE — Patient Instructions (Signed)

## 2017-11-14 ENCOUNTER — Ambulatory Visit: Payer: Managed Care, Other (non HMO) | Admitting: Hematology & Oncology

## 2017-11-14 ENCOUNTER — Other Ambulatory Visit: Payer: Managed Care, Other (non HMO)

## 2017-11-14 LAB — IRON AND TIBC
Iron: 82 ug/dL (ref 42–163)
Saturation Ratios: 21 % — ABNORMAL LOW (ref 42–163)
TIBC: 388 ug/dL (ref 202–409)
UIBC: 306 ug/dL

## 2017-11-14 LAB — LACTATE DEHYDROGENASE: LDH: 451 U/L — ABNORMAL HIGH (ref 98–192)

## 2017-11-14 LAB — FERRITIN: Ferritin: 43 ng/mL (ref 24–336)

## 2017-11-16 ENCOUNTER — Ambulatory Visit (HOSPITAL_BASED_OUTPATIENT_CLINIC_OR_DEPARTMENT_OTHER)
Admission: RE | Admit: 2017-11-16 | Discharge: 2017-11-16 | Disposition: A | Discharge status: Home / Self Care | Payer: Managed Care, Other (non HMO) | Source: Ambulatory Visit | Attending: Hematology & Oncology | Admitting: Hematology & Oncology

## 2017-11-16 DIAGNOSIS — R1010 Upper abdominal pain, unspecified: Secondary | ICD-10-CM | POA: Insufficient documentation

## 2017-11-16 DIAGNOSIS — K824 Cholesterolosis of gallbladder: Secondary | ICD-10-CM | POA: Diagnosis not present

## 2017-11-16 DIAGNOSIS — R161 Splenomegaly, not elsewhere classified: Secondary | ICD-10-CM | POA: Diagnosis not present

## 2017-11-16 DIAGNOSIS — D751 Secondary polycythemia: Secondary | ICD-10-CM | POA: Diagnosis not present

## 2017-11-18 ENCOUNTER — Telehealth: Payer: Self-pay | Admitting: *Deleted

## 2017-11-18 NOTE — Telephone Encounter (Addendum)
Patient is aware of results.   ----- Message from [name] Napoleon, MD sent at 11/18/2017  8:07 AM EDT ----- Call - spleen is smaller!!!!  No other problem!!  [name] [name]

## 2017-12-11 ENCOUNTER — Inpatient Hospital Stay: Payer: Managed Care, Other (non HMO)

## 2017-12-11 ENCOUNTER — Other Ambulatory Visit: Payer: Self-pay

## 2017-12-11 ENCOUNTER — Inpatient Hospital Stay: Payer: Managed Care, Other (non HMO) | Attending: Hematology & Oncology | Admitting: Hematology & Oncology

## 2017-12-11 ENCOUNTER — Encounter: Payer: Self-pay | Admitting: Hematology & Oncology

## 2017-12-11 VITALS — BP 138/82 | HR 61 | Temp 98.3°F | Resp 17 | Wt 207.1 lb

## 2017-12-11 VITALS — BP 129/76 | HR 58 | Temp 98.1°F | Resp 18

## 2017-12-11 DIAGNOSIS — Z7982 Long term (current) use of aspirin: Secondary | ICD-10-CM | POA: Insufficient documentation

## 2017-12-11 DIAGNOSIS — D500 Iron deficiency anemia secondary to blood loss (chronic): Secondary | ICD-10-CM

## 2017-12-11 DIAGNOSIS — D45 Polycythemia vera: Secondary | ICD-10-CM

## 2017-12-11 DIAGNOSIS — D751 Secondary polycythemia: Secondary | ICD-10-CM

## 2017-12-11 DIAGNOSIS — Z79899 Other long term (current) drug therapy: Secondary | ICD-10-CM | POA: Diagnosis not present

## 2017-12-11 LAB — CMP (CANCER CENTER ONLY)
ALT: 52 U/L — ABNORMAL HIGH (ref 0–44)
AST: 62 U/L — ABNORMAL HIGH (ref 15–41)
Albumin: 4.3 g/dL (ref 3.5–5.0)
Alkaline Phosphatase: 89 U/L (ref 38–126)
Anion gap: 10 (ref 5–15)
BUN: 17 mg/dL (ref 6–20)
CO2: 24 mmol/L (ref 22–32)
Calcium: 9.4 mg/dL (ref 8.9–10.3)
Chloride: 108 mmol/L (ref 98–111)
Creatinine: 0.96 mg/dL (ref 0.61–1.24)
GFR, Est AFR Am: >60 mL/min (ref >60)
GFR, Estimated: >60 mL/min (ref >60)
Glucose, Bld: 90 mg/dL (ref 70–99)
Potassium: 3.9 mmol/L (ref 3.5–5.1)
Sodium: 142 mmol/L (ref 135–145)
Total Bilirubin: 1.2 mg/dL (ref 0.3–1.2)
Total Protein: 7 g/dL (ref 6.5–8.1)

## 2017-12-11 LAB — CBC WITH DIFFERENTIAL (CANCER CENTER ONLY)
Basophils Absolute: 0.2 10*3/uL — ABNORMAL HIGH (ref 0.0–0.1)
Basophils Relative: 1 %
Eosinophils Absolute: 0.5 10*3/uL (ref 0.0–0.5)
Eosinophils Relative: 4 %
HCT: 48.6 % (ref 38.7–49.9)
Hemoglobin: 15.8 g/dL (ref 13.0–17.1)
Lymphocytes Relative: 8 %
Lymphs Abs: 1.1 10*3/uL (ref 0.9–3.3)
MCH: 28.6 pg (ref 28.0–33.4)
MCHC: 32.5 g/dL (ref 32.0–35.9)
MCV: 88 fL (ref 82.0–98.0)
Monocytes Absolute: 0.9 10*3/uL (ref 0.1–0.9)
Monocytes Relative: 7 %
Neutro Abs: 11.1 10*3/uL — ABNORMAL HIGH (ref 1.5–6.5)
Neutrophils Relative %: 80 %
Platelet Count: 664 10*3/uL — ABNORMAL HIGH (ref 145–400)
RBC: 5.52 MIL/uL (ref 4.20–5.70)
RDW: 18.4 % — ABNORMAL HIGH (ref 11.1–15.7)
Smear Review: 1
WBC Count: 13.7 10*3/uL — ABNORMAL HIGH (ref 4.0–10.0)

## 2017-12-11 LAB — IRON AND TIBC
Iron: 107 ug/dL (ref 42–163)
Saturation Ratios: 25 % — ABNORMAL LOW (ref 42–163)
TIBC: 421 ug/dL — ABNORMAL HIGH (ref 202–409)
UIBC: 315 ug/dL

## 2017-12-11 LAB — LACTATE DEHYDROGENASE: LDH: 929 U/L — ABNORMAL HIGH (ref 98–192)

## 2017-12-11 LAB — FERRITIN: Ferritin: 19 ng/mL — ABNORMAL LOW (ref 24–336)

## 2017-12-11 MED ORDER — SODIUM CHLORIDE 0.9% FLUSH
10.0000 mL | INTRAVENOUS | Status: DC | PRN
  Filled 2017-12-11: qty 10

## 2017-12-11 MED ORDER — ANAGRELIDE HCL 0.5 MG PO CAPS: 0.5000 mg | ORAL_CAPSULE | Freq: Two times a day (BID) | ORAL | 3 refills | Status: DC

## 2017-12-11 MED ORDER — SODIUM CHLORIDE 0.9% FLUSH
3.0000 mL | Freq: Once | INTRAVENOUS | Status: DC | PRN
  Filled 2017-12-11: qty 10

## 2017-12-11 NOTE — Progress Notes (Signed)
[name] [name] presents today for phlebotomy per MD orders. Phlebotomy procedure started at 0948 and ended at [id number] cc removed  via 16 G needle at R antecubital site.. Patient tolerated procedure well.

## 2017-12-11 NOTE — Progress Notes (Signed)
Hematology and Oncology Follow Up Visit  [name] [name] [id number] 1955/08/22 62 y.o. 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   Anagrelide 0.5 mg po BID -- start 12/11/2017     Interim History:  Mr.  Koerber is back for followup.  He feels a little bit better. Last time I saw him, we stopped his Hydrea.  We also lowered his Inderal LA dose.  He does not feel as tired.  He and his wife had a great time in [place].  They are visiting 1 of their daughters.  They really enjoyed themselves.  They had a lot of good [place] food.    We did do an ultrasound of his spleen a couple weeks ago.  His spleen is actually little bit smaller in size.  His splenic size is 886 cm.  His prior splenic size was 1013 cm.  His plan to count is up.  I think this clearly shows that he needs Hydrea.  However, since Hydrea might be attributing some of his fatigue and not feeling well, we will switch him over to anagrelide and see how that does.  I will start him off on 0.5 mg p.o. twice daily of anagrelide.  He has had no fever.  He does have a little bit of a upper respiratory syndrome.  He got this when he was in [place].  He called his primary care doctor.  Dr. Sharlett Iles as always, being on top of things, sending some antibiotic for him.  He has had no headache.  He has had no leg swelling.  He has had no rashes.  Overall, his performance status is ECOG 1.      Medications:  Current Outpatient Medications:  .  acetaminophen (TYLENOL) 500 MG tablet, Take 1,000 mg by mouth every 6 (six) hours as needed. For pain , Disp: , Rfl:  .  aspirin 81 MG tablet, Take 81 mg by mouth daily.  , Disp: , Rfl:  .  buPROPion  (WELLBUTRIN SR) 150 MG 12 hr tablet, Take 150 mg by mouth every morning., Disp: , Rfl: 12 .  cloNIDine (CATAPRES) 0.1 MG tablet, Take 1 tablet (0.1 mg total) by mouth daily., Disp: 30 tablet, Rfl: 11 .  fluorouracil (EFUDEX) 5 % cream, , Disp: , Rfl:  .  hyoscyamine (LEVBID) 0.375 MG 12 hr tablet, Take 1 tablet (0.375 mg total) by mouth as needed., Disp: 60 tablet, Rfl: 4 .  zolpidem (AMBIEN CR) 12.5 MG CR tablet, Take by mouth as needed. For sleep pt usually takes 1/2 tablet, Disp: , Rfl:   Allergies: No Known Allergies  Past Medical History, Surgical history, Social history, and Family History were reviewed and updated.  Review of Systems: Review of Systems  Constitutional: Positive for malaise/fatigue.  HENT: Negative.   Eyes: Negative.   Respiratory: Negative.   Cardiovascular: Negative.   Gastrointestinal: Negative.   Genitourinary: Negative.   Musculoskeletal: Positive for myalgias.  Skin: Negative.   Neurological: Negative.   Endo/Heme/Allergies: Negative.   Psychiatric/Behavioral: Negative.      Physical Exam:  weight is 207 lb 1.9 oz (93.9 kg). His oral temperature is 98.3 F (36.8 C). His blood pressure is 138/82 and his pulse  is 61. His respiration is 17 and oxygen saturation is 99%.   Physical Exam  Constitutional: He is oriented to person, place, and time.  HENT:  Head: Normocephalic and atraumatic.  Mouth/Throat: Oropharynx is clear and moist.  Eyes: Pupils are equal, round, and reactive to light. EOM are normal.  Neck: Normal range of motion.  Cardiovascular: Normal rate, regular rhythm and normal heart sounds.  Pulmonary/Chest: Effort normal and breath sounds normal.  Abdominal: Soft. Bowel sounds are normal.  Musculoskeletal: Normal range of motion. He exhibits no edema, tenderness or deformity.  Lymphadenopathy:    He has no cervical adenopathy.  Neurological: He is alert and oriented to person, place, and time.  Skin: Skin is warm and dry. No rash  noted. No erythema.  Psychiatric: He has a normal mood and affect. His behavior is normal. Judgment and thought content normal.  Vitals reviewed.    Lab Results  Component Value Date   WBC 13.7 (H) 12/11/2017   HGB 15.8 12/11/2017   HCT 48.6 12/11/2017   MCV 88.0 12/11/2017   PLT 664 (H) 12/11/2017     Chemistry      Component Value Date/Time   NA 142 11/13/2017 1523   NA 144 04/25/2017 1506   NA 139 04/12/2016 1113   K 4.0 11/13/2017 1523   K 4.2 04/25/2017 1506   K 4.1 04/12/2016 1113   CL 104 11/13/2017 1523   CL 105 04/25/2017 1506   CO2 30 11/13/2017 1523   CO2 29 04/25/2017 1506   CO2 22 04/12/2016 1113   BUN 14 11/13/2017 1523   BUN 17 04/25/2017 1506   BUN 16.3 04/12/2016 1113   CREATININE 1.00 11/13/2017 1523   CREATININE 1.2 04/25/2017 1506   CREATININE 0.9 04/12/2016 1113      Component Value Date/Time   CALCIUM 9.3 11/13/2017 1523   CALCIUM 9.5 04/25/2017 1506   CALCIUM 9.1 04/12/2016 1113   ALKPHOS 60 11/13/2017 1523   ALKPHOS 73 04/25/2017 1506   ALKPHOS 66 04/12/2016 1113   AST 34 11/13/2017 1523   AST 31 04/12/2016 1113   ALT 39 11/13/2017 1523   ALT 58 (H) 04/25/2017 1506   ALT 36 04/12/2016 1113   BILITOT 0.9 11/13/2017 1523   BILITOT 0.73 04/12/2016 1113         Impression and Plan: Mr. Trim is 62 year old gentleman with polycythemia vera.  We will phlebotomize him today.  I think with him being off Hydrea, we may have to phlebotomize him a little more often.  Hopefully, the anagrelide will control his thrombocytosis.  We will plan to get him back to see [place] in another 3 weeks.  I want to have him come back so we can see how the anagrelide is doing and see if we need to adjust the anagrelide dose.  We also may need to phlebotomize him.     [name], MD 8/7/20199:21 AM

## 2017-12-11 NOTE — Patient Instructions (Signed)

## 2018-01-01 ENCOUNTER — Other Ambulatory Visit: Payer: Managed Care, Other (non HMO)

## 2018-01-01 ENCOUNTER — Inpatient Hospital Stay: Payer: Managed Care, Other (non HMO)

## 2018-01-01 ENCOUNTER — Ambulatory Visit: Payer: Managed Care, Other (non HMO) | Admitting: Family

## 2018-01-01 ENCOUNTER — Inpatient Hospital Stay (HOSPITAL_BASED_OUTPATIENT_CLINIC_OR_DEPARTMENT_OTHER): Payer: Managed Care, Other (non HMO) | Admitting: Hematology & Oncology

## 2018-01-01 VITALS — BP 138/80 | HR 65 | Temp 98.3°F | Resp 18 | Wt 210.0 lb

## 2018-01-01 VITALS — BP 136/82 | HR 60 | Resp 18

## 2018-01-01 DIAGNOSIS — D45 Polycythemia vera: Secondary | ICD-10-CM

## 2018-01-01 DIAGNOSIS — D500 Iron deficiency anemia secondary to blood loss (chronic): Secondary | ICD-10-CM

## 2018-01-01 DIAGNOSIS — Z7982 Long term (current) use of aspirin: Secondary | ICD-10-CM

## 2018-01-01 DIAGNOSIS — Z79899 Other long term (current) drug therapy: Secondary | ICD-10-CM

## 2018-01-01 DIAGNOSIS — D751 Secondary polycythemia: Secondary | ICD-10-CM

## 2018-01-01 LAB — CBC WITH DIFFERENTIAL (CANCER CENTER ONLY)
Basophils Absolute: 0.1 10*3/uL (ref 0.0–0.1)
Basophils Relative: 1 %
Eosinophils Absolute: 0.7 10*3/uL — ABNORMAL HIGH (ref 0.0–0.5)
Eosinophils Relative: 4 %
HCT: 45.1 % (ref 38.7–49.9)
Hemoglobin: 14.4 g/dL (ref 13.0–17.1)
Lymphocytes Relative: 7 %
Lymphs Abs: 1.3 10*3/uL (ref 0.9–3.3)
MCH: 27.3 pg — ABNORMAL LOW (ref 28.0–33.4)
MCHC: 31.9 g/dL — ABNORMAL LOW (ref 32.0–35.9)
MCV: 85.6 fL (ref 82.0–98.0)
Monocytes Absolute: 1.3 10*3/uL — ABNORMAL HIGH (ref 0.1–0.9)
Monocytes Relative: 8 %
Neutro Abs: 13.9 10*3/uL — ABNORMAL HIGH (ref 1.5–6.5)
Neutrophils Relative %: 80 %
Platelet Count: 563 10*3/uL — ABNORMAL HIGH (ref 145–400)
RBC: 5.27 MIL/uL (ref 4.20–5.70)
RDW: 18.2 % — ABNORMAL HIGH (ref 11.1–15.7)
WBC Count: 17.2 10*3/uL — ABNORMAL HIGH (ref 4.0–10.0)

## 2018-01-01 LAB — CMP (CANCER CENTER ONLY)
ALT: 28 U/L (ref 10–47)
AST: 32 U/L (ref 11–38)
Albumin: 4 g/dL (ref 3.5–5.0)
Alkaline Phosphatase: 61 U/L (ref 26–84)
Anion gap: 3 — ABNORMAL LOW (ref 5–15)
BUN: 16 mg/dL (ref 7–22)
CO2: 30 mmol/L (ref 18–33)
Calcium: 9 mg/dL (ref 8.0–10.3)
Chloride: 104 mmol/L (ref 98–108)
Creatinine: 0.7 mg/dL (ref 0.60–1.20)
Glucose, Bld: 97 mg/dL (ref 73–118)
Potassium: 3.9 mmol/L (ref 3.3–4.7)
Sodium: 137 mmol/L (ref 128–145)
Total Bilirubin: 0.9 mg/dL (ref 0.2–1.6)
Total Protein: 6.5 g/dL (ref 6.4–8.1)

## 2018-01-01 LAB — SAVE SMEAR

## 2018-01-01 NOTE — Progress Notes (Signed)
Hematology and Oncology Follow Up Visit  [name] [name] [id number] [birth year] 62 y.o. 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   Anagrelide 0.5 mg po BID -- start 12/11/2017     Interim History:  [name] is back for followup.  He is doing okay.  We did stop his Hydrea.  We also lowered his Inderal dose.  He says he does feel a little bit more the way of palpitations.  He has had no problems with anagrelide.  He is doing well with it.  Para he still is having some sinus issues.  He saw his family doctor.  He had a chest x-ray done that he says was okay.  Hopefully, the steroids that he is on will help.  His white cell count is up a little bit more today.  His platelet count is down by 100,000.  His blood count today is 563,000.  Hopefully, it would be lower without the prednisone on board.  He does want to have a phlebotomy today.  His hematocrit is just above 45%.  He would just feel better having the phlebotomy.  He has had no fever.  He has had no nausea or vomiting.  He has had no rashes.  Overall, his performance status is ECOG 0.      Medications:  Current Outpatient Medications:  .  Fexofenadine HCl (MUCINEX ALLERGY PO), Take by mouth., Disp: , Rfl:  .  fluticasone (FLONASE) 50 MCG/ACT nasal spray, Place into both nostrils daily., Disp: , Rfl:  .  PREDNISONE PO, Take by mouth., Disp: , Rfl:  .  acetaminophen (TYLENOL) 500 MG tablet, Take 1,000 mg by mouth every 6 (six) hours as needed. For pain , Disp: , Rfl:  .  anagrelide (AGRYLIN) 0.5 MG capsule, Take 1 capsule (0.5 mg total) by mouth 2 (two) times daily., Disp: 60 capsule, Rfl: 3 .  aspirin 81 MG tablet, Take 81 mg by mouth daily.  , Disp: , Rfl:    .  buPROPion (WELLBUTRIN SR) 150 MG 12 hr tablet, Take 150 mg by mouth every morning., Disp: , Rfl: 12 .  cloNIDine (CATAPRES) 0.1 MG tablet, Take 1 tablet (0.1 mg total) by mouth daily., Disp: 30 tablet, Rfl: 11 .  fluorouracil (EFUDEX) 5 % cream, , Disp: , Rfl:  .  hyoscyamine (LEVBID) 0.375 MG 12 hr tablet, Take 1 tablet (0.375 mg total) by mouth as needed., Disp: 60 tablet, Rfl: 4 .  zolpidem (AMBIEN CR) 12.5 MG CR tablet, Take by mouth as needed. For sleep pt usually takes 1/2 tablet, Disp: , Rfl:   Allergies: No Known Allergies  Past Medical History, Surgical history, Social history, and Family History were reviewed and updated.  Review of Systems: Review of Systems  Constitutional: Positive for malaise/fatigue.  HENT: Negative.   Eyes: Negative.   Respiratory: Negative.   Cardiovascular: Negative.   Gastrointestinal: Negative.   Genitourinary: Negative.   Musculoskeletal: Positive for myalgias.  Skin: Negative.   Neurological: Negative.   Endo/Heme/Allergies: Negative.   Psychiatric/Behavioral: Negative.      Physical Exam:  weight is 210 lb (95.3 kg). His oral temperature is 98.3 F (36.8 C). His blood pressure is 138/80 and his pulse is 65.  His respiration is 18 and oxygen saturation is 99%.   Physical Exam  Constitutional: He is oriented to person, place, and time.  HENT:  Head: Normocephalic and atraumatic.  Mouth/Throat: Oropharynx is clear and moist.  Eyes: Pupils are equal, round, and reactive to light. EOM are normal.  Neck: Normal range of motion.  Cardiovascular: Normal rate, regular rhythm and normal heart sounds.  Pulmonary/Chest: Effort normal and breath sounds normal.  Abdominal: Soft. Bowel sounds are normal.  Musculoskeletal: Normal range of motion. He exhibits no edema, tenderness or deformity.  Lymphadenopathy:    He has no cervical adenopathy.  Neurological: He is alert and oriented to person, place, and time.  Skin: Skin is warm and dry. No  rash noted. No erythema.  Psychiatric: He has a normal mood and affect. His behavior is normal. Judgment and thought content normal.  Vitals reviewed.    Lab Results  Component Value Date   WBC 17.2 (H) 01/01/2018   HGB 14.4 01/01/2018   HCT 45.1 01/01/2018   MCV 85.6 01/01/2018   PLT 563 (H) 01/01/2018     Chemistry      Component Value Date/Time   NA 137 01/01/2018 1440   NA 144 04/25/2017 1506   NA 139 04/12/2016 1113   K 3.9 01/01/2018 1440   K 4.2 04/25/2017 1506   K 4.1 04/12/2016 1113   CL 104 01/01/2018 1440   CL 105 04/25/2017 1506   CO2 30 01/01/2018 1440   CO2 29 04/25/2017 1506   CO2 22 04/12/2016 1113   BUN 16 01/01/2018 1440   BUN 17 04/25/2017 1506   BUN 16.3 04/12/2016 1113   CREATININE 0.70 01/01/2018 1440   CREATININE 1.2 04/25/2017 1506   CREATININE 0.9 04/12/2016 1113      Component Value Date/Time   CALCIUM 9.0 01/01/2018 1440   CALCIUM 9.5 04/25/2017 1506   CALCIUM 9.1 04/12/2016 1113   ALKPHOS 61 01/01/2018 1440   ALKPHOS 73 04/25/2017 1506   ALKPHOS 66 04/12/2016 1113   AST 32 01/01/2018 1440   AST 31 04/12/2016 1113   ALT 28 01/01/2018 1440   ALT 58 (H) 04/25/2017 1506   ALT 36 04/12/2016 1113   BILITOT 0.9 01/01/2018 1440   BILITOT 0.73 04/12/2016 1113         Impression and Plan: [name] [name] is 62 year old gentleman with polycythemia vera.  We will phlebotomize him today.  Again, I will not change his anagrelide dose.  We will plan to get him back in about 5 weeks.  I think this would be reasonable.  Hopefully, we will see his platelet count coming down even more.    [name], MD 8/28/20193:47 PM

## 2018-01-01 NOTE — Progress Notes (Signed)
[name] [name] presents today for phlebotomy per MD orders. Phlebotomy procedure started at 1608 and ended at 1613. 520 cc removed via 16 G needle at L antecubital site. Patient tolerated procedure well.

## 2018-01-02 LAB — LACTATE DEHYDROGENASE: LDH: 698 U/L — ABNORMAL HIGH (ref 98–192)

## 2018-01-02 LAB — IRON AND TIBC
Iron: 39 ug/dL — ABNORMAL LOW (ref 42–163)
Saturation Ratios: 9 % — ABNORMAL LOW (ref 42–163)
TIBC: 458 ug/dL — ABNORMAL HIGH (ref 202–409)
UIBC: 418 ug/dL

## 2018-01-02 LAB — FERRITIN: Ferritin: 14 ng/mL — ABNORMAL LOW (ref 24–336)

## 2018-01-28 ENCOUNTER — Encounter: Payer: Self-pay | Admitting: Cardiology

## 2018-02-05 NOTE — Progress Notes (Signed)
Maryellen Pile, MD Reason for referral-palpitations  HPI: 62 year old male for evaluation of palpitations at request of Bevelyn Buckles, MD.  I have seen patient previously but not since September 2013.  Stress echocardiogram June 2011 normal.  TSH at that time also normal.  CardioNet revealed sinus rhythm with occasional PACs and PVCs.  Patient states that for the past 6 months he has had a sensation occasionally at rest where he feels like he cannot take a deep breath.  He denies dyspnea on exertion, orthopnea, PND, pedal edema.  He has occasional sharp pains in his chest for 1 to 2 seconds but denies exertional chest pain.  His Inderal dose was reduced in July and he has noticed worsening palpitations.  He had a more prolonged episode for 1 hour last week.  Cardiology now asked to evaluate.  Current Outpatient Medications  Medication Sig Dispense Refill  . acetaminophen (TYLENOL) 500 MG tablet Take 1,000 mg by mouth every 6 (six) hours as needed. For pain     . anagrelide (AGRYLIN) 0.5 MG capsule Take 1 capsule (0.5 mg total) by mouth 2 (two) times daily. 60 capsule 3  . aspirin 81 MG tablet Take 81 mg by mouth daily.      Marland Kitchen buPROPion (WELLBUTRIN SR) 150 MG 12 hr tablet Take 150 mg by mouth every morning.  12  . cloNIDine (CATAPRES) 0.1 MG tablet Take 1 tablet (0.1 mg total) by mouth daily. 30 tablet 11  . Fexofenadine HCl (MUCINEX ALLERGY PO) Take 1 tablet by mouth daily as needed.     . fluorouracil (EFUDEX) 5 % cream     . fluticasone (FLONASE) 50 MCG/ACT nasal spray Place into both nostrils daily.    . hyoscyamine (LEVBID) 0.375 MG 12 hr tablet Take 1 tablet (0.375 mg total) by mouth as needed. 60 tablet 4  . propranolol (INDERAL) 80 MG tablet Take 80 mg by mouth at bedtime.    Marland Kitchen zolpidem (AMBIEN CR) 12.5 MG CR tablet Take by mouth as needed. For sleep pt usually takes 1/2 tablet     No current facility-administered medications for this visit.     No Known  Allergies   Past Medical History:  Diagnosis Date  . Diverticulosis of colon (without mention of hemorrhage)   . Hemorrhoids   . IBS (irritable bowel syndrome)   . Iron deficiency anemia due to chronic blood loss 09/26/2017  . Irregular heartbeat   . Nephrolithiasis   . Other and unspecified hyperlipidemia   . Perirectal fistula   . Polycythemia, secondary   . Stye    blocker duct right eye lid    Past Surgical History:  Procedure Laterality Date  . RETINAL DETACHMENT SURGERY    . ROTATOR CUFF REPAIR     rt.  Marland Kitchen VASECTOMY      Social History   Socioeconomic History  . Marital status: Married    Spouse name: Not on file  . Number of children: 2  . Years of education: Not on file  . Highest education level: Not on file  Occupational History  . Occupation: Lobbyist: [employer]  Social Needs  . Financial resource strain: Not on file  . Food insecurity:    Worry: Not on file    Inability: Not on file  . Transportation needs:    Medical: Not on file    Non-medical: Not on file  Tobacco Use  . Smoking status: Never Smoker  . Smokeless tobacco:  Never Used  . Tobacco comment: never used tobacco  Substance and Sexual Activity  . Alcohol use: Yes    Alcohol/week: 0.0 standard drinks    Comment: Occasional  . Drug use: No  . Sexual activity: Not on file  Lifestyle  . Physical activity:    Days per week: Not on file    Minutes per session: Not on file  . Stress: Not on file  Relationships  . Social connections:    Talks on phone: Not on file    Gets together: Not on file    Attends religious service: Not on file    Active member of club or organization: Not on file    Attends meetings of clubs or organizations: Not on file    Relationship status: Not on file  . Intimate partner violence:    Fear of current or ex partner: Not on file    Emotionally abused: Not on file    Physically abused: Not on file    Forced sexual activity: Not on file   Other Topics Concern  . Not on file  Social History Narrative  . Not on file    Family History  Problem Relation Age of Onset  . Aortic aneurysm Father   . Heart attack Brother   . Colon cancer Neg Hx   . Esophageal cancer Neg Hx   . Pancreatic cancer Neg Hx   . Prostate cancer Neg Hx   . Rectal cancer Neg Hx   . Stomach cancer Neg Hx     ROS: no fevers or chills, productive cough, hemoptysis, dysphasia, odynophagia, melena, hematochezia, dysuria, hematuria, rash, seizure activity, orthopnea, PND, pedal edema, claudication. Remaining systems are negative.  Physical Exam:   Blood pressure 122/86, pulse 61, height 6' (1.829 m), weight 206 lb 12.8 oz (93.8 kg).  General:  Well developed/well nourished in NAD Skin warm/dry Patient not depressed No peripheral clubbing Back-normal HEENT-normal/normal eyelids Neck supple/normal carotid upstroke bilaterally; no bruits; no JVD; no thyromegaly chest - CTA/ normal expansion CV - RRR/normal S1 and S2; no murmurs, rubs or gallops;  PMI nondisplaced Abdomen -NT/ND, no HSM, no mass, + bowel sounds, no bruit 2+ femoral pulses, no bruits Ext-no edema, chords, 2+ DP Neuro-grossly nonfocal  ECG -sinus rhythm at a rate of 61, no ST changes.  Personally reviewed  A/P  1 Palpitations-previous monitor showed PACs and PVCs although more recent symptoms seemed prolonged lasting 1 hour.  I will increase Inderal LA to 120 mg daily.  If symptoms persist we will plan an event monitor.  Schedule echocardiogram to assess LV function.  2 chest pain-occasional sharp chest pain that does not sound cardiac in nature.  Electrocardiogram normal.  Will not pursue further ischemia evaluation at this point.  3 hyperlipidemia-Per primary care.  4 hypertension-given that we are increasing Inderal LA I will discontinue clonidine.  Follow blood pressure and adjust regimen as needed.  5 dyspnea-etiology unclear.  He is not volume overloaded.  He states he  feels the sensation like he cannot take a deep breath at times.  Echocardiogram to assess LV function.  Note he also has polycythemia vera and echocardiogram will help rule out infiltrative cardiomyopathy.  Kirk Ruths, MD

## 2018-02-06 ENCOUNTER — Other Ambulatory Visit: Payer: Managed Care, Other (non HMO)

## 2018-02-06 ENCOUNTER — Ambulatory Visit: Payer: Managed Care, Other (non HMO) | Admitting: Hematology & Oncology

## 2018-02-12 ENCOUNTER — Encounter: Payer: Self-pay | Admitting: Cardiology

## 2018-02-12 ENCOUNTER — Ambulatory Visit: Payer: Managed Care, Other (non HMO) | Admitting: Cardiology

## 2018-02-12 ENCOUNTER — Inpatient Hospital Stay: Payer: Managed Care, Other (non HMO) | Attending: Hematology & Oncology

## 2018-02-12 ENCOUNTER — Other Ambulatory Visit: Payer: Self-pay

## 2018-02-12 ENCOUNTER — Inpatient Hospital Stay (HOSPITAL_BASED_OUTPATIENT_CLINIC_OR_DEPARTMENT_OTHER): Payer: Managed Care, Other (non HMO) | Admitting: Hematology & Oncology

## 2018-02-12 ENCOUNTER — Encounter: Payer: Self-pay | Admitting: Hematology & Oncology

## 2018-02-12 ENCOUNTER — Inpatient Hospital Stay: Payer: Managed Care, Other (non HMO)

## 2018-02-12 VITALS — BP 155/88 | HR 70 | Temp 98.3°F | Resp 20 | Wt 205.0 lb

## 2018-02-12 VITALS — BP 130/75 | HR 74

## 2018-02-12 VITALS — BP 122/86 | HR 61 | Ht 72.0 in | Wt 206.8 lb

## 2018-02-12 DIAGNOSIS — R002 Palpitations: Secondary | ICD-10-CM

## 2018-02-12 DIAGNOSIS — D500 Iron deficiency anemia secondary to blood loss (chronic): Secondary | ICD-10-CM

## 2018-02-12 DIAGNOSIS — Z79899 Other long term (current) drug therapy: Secondary | ICD-10-CM | POA: Insufficient documentation

## 2018-02-12 DIAGNOSIS — D751 Secondary polycythemia: Secondary | ICD-10-CM

## 2018-02-12 DIAGNOSIS — D45 Polycythemia vera: Secondary | ICD-10-CM

## 2018-02-12 DIAGNOSIS — Z7982 Long term (current) use of aspirin: Secondary | ICD-10-CM | POA: Diagnosis not present

## 2018-02-12 DIAGNOSIS — R0600 Dyspnea, unspecified: Secondary | ICD-10-CM

## 2018-02-12 DIAGNOSIS — I10 Essential (primary) hypertension: Secondary | ICD-10-CM | POA: Diagnosis not present

## 2018-02-12 DIAGNOSIS — R0789 Other chest pain: Secondary | ICD-10-CM | POA: Diagnosis not present

## 2018-02-12 LAB — CBC WITH DIFFERENTIAL (CANCER CENTER ONLY)
Abs Immature Granulocytes: 0.58 10*3/uL — ABNORMAL HIGH (ref 0.00–0.07)
Basophils Absolute: 0.3 10*3/uL — ABNORMAL HIGH (ref 0.0–0.1)
Basophils Relative: 2 %
Eosinophils Absolute: 0.6 10*3/uL — ABNORMAL HIGH (ref 0.0–0.5)
Eosinophils Relative: 5 %
HCT: 46.8 % (ref 39.0–52.0)
Hemoglobin: 14.8 g/dL (ref 13.0–17.0)
Immature Granulocytes: 4 %
Lymphocytes Relative: 10 %
Lymphs Abs: 1.4 10*3/uL (ref 0.7–4.0)
MCH: 25.3 pg — ABNORMAL LOW (ref 26.0–34.0)
MCHC: 31.6 g/dL (ref 30.0–36.0)
MCV: 79.9 fL — ABNORMAL LOW (ref 80.0–100.0)
Monocytes Absolute: 1.1 10*3/uL — ABNORMAL HIGH (ref 0.1–1.0)
Monocytes Relative: 8 %
Neutro Abs: 9.6 10*3/uL — ABNORMAL HIGH (ref 1.7–7.7)
Neutrophils Relative %: 71 %
Platelet Count: 563 10*3/uL — ABNORMAL HIGH (ref 150–400)
RBC: 5.86 MIL/uL — ABNORMAL HIGH (ref 4.22–5.81)
RDW: 19.2 % — ABNORMAL HIGH (ref 11.5–15.5)
WBC Count: 13.6 10*3/uL — ABNORMAL HIGH (ref 4.0–10.5)
nRBC: 0.4 % — ABNORMAL HIGH (ref 0.0–0.2)

## 2018-02-12 LAB — CMP (CANCER CENTER ONLY)
ALT: 36 U/L (ref 10–47)
AST: 42 U/L — ABNORMAL HIGH (ref 11–38)
Albumin: 3.9 g/dL (ref 3.5–5.0)
Alkaline Phosphatase: 70 U/L (ref 26–84)
Anion gap: 2 — ABNORMAL LOW (ref 5–15)
BUN: 14 mg/dL (ref 7–22)
CO2: 28 mmol/L (ref 18–33)
Calcium: 9.4 mg/dL (ref 8.0–10.3)
Chloride: 111 mmol/L — ABNORMAL HIGH (ref 98–108)
Creatinine: 0.8 mg/dL (ref 0.60–1.20)
Glucose, Bld: 115 mg/dL (ref 73–118)
Potassium: 3.9 mmol/L (ref 3.3–4.7)
Sodium: 141 mmol/L (ref 128–145)
Total Bilirubin: 1 mg/dL (ref 0.2–1.6)
Total Protein: 6.5 g/dL (ref 6.4–8.1)

## 2018-02-12 MED ORDER — ANAGRELIDE HCL 1 MG PO CAPS: 1.0000 mg | ORAL_CAPSULE | Freq: Two times a day (BID) | ORAL | 3 refills | Status: DC

## 2018-02-12 MED ORDER — PROPRANOLOL HCL ER 120 MG PO CP24: 120.0000 mg | ORAL_CAPSULE | Freq: Every day | ORAL | 3 refills | Status: DC

## 2018-02-12 MED ORDER — SODIUM CHLORIDE 0.9 % IV SOLN
510.0000 mg | Freq: Once | INTRAVENOUS | Status: DC
  Filled 2018-02-12: qty 17

## 2018-02-12 NOTE — Progress Notes (Signed)
[name] [name] presents today for phlebotomy per MD orders. Phlebotomy procedure started at 1610 and ended at 1622 with  500 grams removed via 16 to [place]. Patient observed for 30 minutes after procedure without any incident. Patient tolerated procedure well. Diet and nutrition offfered.

## 2018-02-12 NOTE — Progress Notes (Signed)
Hematology and Oncology Follow Up Visit  [name] [name] [id number] April 19, 1956 62 y.o. 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   Anagrelide 1.0 mg po BID -- start 02/12/2018     Interim History:  [name] is back for followup.  He is doing okay.  Apparently, he is having trouble getting the 0.5 mg anagrelide dosing.  With his platelet count not dropping any further, I will move him up to 1 mg p.o. twice daily dosing.  He is done well with the anagrelide from a perspective of side effects.  He saw his cardiologist today.  He had an EKG which looked all right.  Dr. Stanford Breed wanted to do an echocardiogram on him.  This will be done next week.  He has had no abdominal pain.  He has had some palpitations.  Dr. Stanford Breed increase his Inderal dose back up to 120 mg.  He has had no nausea or vomiting.  There is been no rashes.  He has had no leg swelling.  He has had no change in bowel or bladder habits.  We will go ahead and phlebotomize him today.  Overall, his performance status is ECOG 0.      Medications:  Current Outpatient Medications:  .  acetaminophen (TYLENOL) 500 MG tablet, Take 1,000 mg by mouth every 6 (six) hours as needed. For pain , Disp: , Rfl:  .  anagrelide (AGRYLIN) 1 MG capsule, Take 1 capsule (1 mg total) by mouth 2 (two) times daily., Disp: 60 capsule, Rfl: 3 .  aspirin 81 MG tablet, Take 81 mg by mouth daily.  , Disp: , Rfl:  .  buPROPion (WELLBUTRIN SR) 150 MG 12 hr tablet, Take 150 mg by mouth every morning., Disp: , Rfl: 12 .  Fexofenadine HCl (MUCINEX ALLERGY PO), Take 1 tablet by mouth daily as needed. , Disp: , Rfl:  .  fluorouracil (EFUDEX) 5 % cream, , Disp: , Rfl:  .  fluticasone  (FLONASE) 50 MCG/ACT nasal spray, Place into both nostrils daily., Disp: , Rfl:  .  hyoscyamine (LEVBID) 0.375 MG 12 hr tablet, Take 1 tablet (0.375 mg total) by mouth as needed., Disp: 60 tablet, Rfl: 4 .  propranolol ER (INDERAL LA) 120 MG 24 hr capsule, Take 1 capsule (120 mg total) by mouth daily., Disp: 90 capsule, Rfl: 3 .  zolpidem (AMBIEN CR) 12.5 MG CR tablet, Take by mouth as needed. For sleep pt usually takes 1/2 tablet, Disp: , Rfl:   Allergies: No Known Allergies  Past Medical History, Surgical history, Social history, and Family History were reviewed and updated.  Review of Systems: Review of Systems  Constitutional: Positive for malaise/fatigue.  HENT: Negative.   Eyes: Negative.   Respiratory: Negative.   Cardiovascular: Negative.   Gastrointestinal: Negative.   Genitourinary: Negative.   Musculoskeletal: Positive for myalgias.  Skin: Negative.   Neurological: Negative.   Endo/Heme/Allergies: Negative.   Psychiatric/Behavioral: Negative.      Physical Exam:  weight is 205 lb (93 kg). His oral temperature is 98.3 F (36.8 C). His blood pressure is 155/88 (abnormal) and his pulse is 70. His respiration is 20 and oxygen saturation is 98%.   Physical Exam  Constitutional: He is oriented  to person, place, and time.  HENT:  Head: Normocephalic and atraumatic.  Mouth/Throat: Oropharynx is clear and moist.  Eyes: Pupils are equal, round, and reactive to light. EOM are normal.  Neck: Normal range of motion.  Cardiovascular: Normal rate, regular rhythm and normal heart sounds.  Pulmonary/Chest: Effort normal and breath sounds normal.  Abdominal: Soft. Bowel sounds are normal.  Musculoskeletal: Normal range of motion. He exhibits no edema, tenderness or deformity.  Lymphadenopathy:    He has no cervical adenopathy.  Neurological: He is alert and oriented to person, place, and time.  Skin: Skin is warm and dry. No rash noted. No erythema.  Psychiatric: He has a normal  mood and affect. His behavior is normal. Judgment and thought content normal.  Vitals reviewed.    Lab Results  Component Value Date   WBC 13.6 (H) 02/12/2018   HGB 14.8 02/12/2018   HCT 46.8 02/12/2018   MCV 79.9 (L) 02/12/2018   PLT 563 (H) 02/12/2018     Chemistry      Component Value Date/Time   NA 141 02/12/2018 1510   NA 144 04/25/2017 1506   NA 139 04/12/2016 1113   K 3.9 02/12/2018 1510   K 4.2 04/25/2017 1506   K 4.1 04/12/2016 1113   CL 111 (H) 02/12/2018 1510   CL 105 04/25/2017 1506   CO2 28 02/12/2018 1510   CO2 29 04/25/2017 1506   CO2 22 04/12/2016 1113   BUN 14 02/12/2018 1510   BUN 17 04/25/2017 1506   BUN 16.3 04/12/2016 1113   CREATININE 0.80 02/12/2018 1510   CREATININE 1.2 04/25/2017 1506   CREATININE 0.9 04/12/2016 1113      Component Value Date/Time   CALCIUM 9.4 02/12/2018 1510   CALCIUM 9.5 04/25/2017 1506   CALCIUM 9.1 04/12/2016 1113   ALKPHOS 70 02/12/2018 1510   ALKPHOS 73 04/25/2017 1506   ALKPHOS 66 04/12/2016 1113   AST 42 (H) 02/12/2018 1510   AST 31 04/12/2016 1113   ALT 36 02/12/2018 1510   ALT 58 (H) 04/25/2017 1506   ALT 36 04/12/2016 1113   BILITOT 1.0 02/12/2018 1510   BILITOT 0.73 04/12/2016 1113         Impression and Plan: [name] [name] is 62 year old gentleman with polycythemia vera.  We will phlebotomize him today.  I think this will be helpful.  I would like to get him back in another 5 weeks.  I want to make sure that we get him back before the holiday season is in full swing so that we can adjust his medications.  I know that he will be busy over the holidays and I do not want him having to have problems with side effects from medications.     [name], MD 10/9/20194:06 PM

## 2018-02-12 NOTE — Patient Instructions (Signed)
Medication Instructions:   INCREASE PROPRANOLOL TO 120 MG ONCE DAILY  Testing/Procedures:  Your physician has requested that you have an echocardiogram. Echocardiography is a painless test that uses sound waves to create images of your heart. It provides your doctor with information about the size and shape of your heart and how well your heart's chambers and valves are working. This procedure takes approximately one hour. There are no restrictions for this procedure.  AT THE [facility]  Follow-Up:  DR Stanford Breed WOULD LIKE TO SEE YOU BACK IN 6 MONTHS. PLEASE CALL THE OFFICE 2 MONTHS PRIOR TO THAT APPOINTMENT TIME TO SCHEDULE.

## 2018-02-13 LAB — IRON AND TIBC
Iron: 111 ug/dL (ref 42–163)
Saturation Ratios: 25 % — ABNORMAL LOW (ref 42–163)
TIBC: 439 ug/dL — ABNORMAL HIGH (ref 202–409)
UIBC: 327 ug/dL

## 2018-02-13 LAB — LACTATE DEHYDROGENASE: LDH: 776 U/L — ABNORMAL HIGH (ref 98–192)

## 2018-02-13 LAB — FERRITIN: Ferritin: 25 ng/mL (ref 24–336)

## 2018-02-18 ENCOUNTER — Other Ambulatory Visit: Payer: Self-pay | Admitting: Hematology & Oncology

## 2018-02-19 ENCOUNTER — Ambulatory Visit (HOSPITAL_BASED_OUTPATIENT_CLINIC_OR_DEPARTMENT_OTHER)
Admission: RE | Admit: 2018-02-19 | Discharge: 2018-02-19 | Disposition: A | Discharge status: Home / Self Care | Payer: Managed Care, Other (non HMO) | Source: Ambulatory Visit | Attending: Cardiology | Admitting: Cardiology

## 2018-02-19 DIAGNOSIS — R002 Palpitations: Secondary | ICD-10-CM | POA: Diagnosis present

## 2018-02-19 DIAGNOSIS — I10 Essential (primary) hypertension: Secondary | ICD-10-CM | POA: Diagnosis not present

## 2018-02-19 NOTE — Progress Notes (Signed)
  Echocardiogram 2D Echocardiogram has been performed.  Emmary Culbreath T Meosha Castanon 02/19/2018, 3:45 PM

## 2018-03-19 ENCOUNTER — Ambulatory Visit: Payer: Managed Care, Other (non HMO) | Admitting: Hematology & Oncology

## 2018-03-19 ENCOUNTER — Other Ambulatory Visit: Payer: Managed Care, Other (non HMO)

## 2018-03-24 ENCOUNTER — Inpatient Hospital Stay: Payer: Managed Care, Other (non HMO)

## 2018-03-24 ENCOUNTER — Inpatient Hospital Stay: Payer: Managed Care, Other (non HMO) | Admitting: Hematology & Oncology

## 2018-04-11 ENCOUNTER — Inpatient Hospital Stay (HOSPITAL_BASED_OUTPATIENT_CLINIC_OR_DEPARTMENT_OTHER): Payer: Managed Care, Other (non HMO) | Admitting: Hematology & Oncology

## 2018-04-11 ENCOUNTER — Inpatient Hospital Stay: Payer: Managed Care, Other (non HMO)

## 2018-04-11 ENCOUNTER — Inpatient Hospital Stay: Payer: Managed Care, Other (non HMO) | Attending: Hematology & Oncology

## 2018-04-11 VITALS — BP 129/78 | HR 71 | Temp 98.0°F | Resp 18 | Ht 72.0 in | Wt 207.8 lb

## 2018-04-11 DIAGNOSIS — R5381 Other malaise: Secondary | ICD-10-CM

## 2018-04-11 DIAGNOSIS — Z7982 Long term (current) use of aspirin: Secondary | ICD-10-CM | POA: Diagnosis not present

## 2018-04-11 DIAGNOSIS — R5383 Other fatigue: Secondary | ICD-10-CM

## 2018-04-11 DIAGNOSIS — D500 Iron deficiency anemia secondary to blood loss (chronic): Secondary | ICD-10-CM

## 2018-04-11 DIAGNOSIS — D751 Secondary polycythemia: Secondary | ICD-10-CM

## 2018-04-11 DIAGNOSIS — R002 Palpitations: Secondary | ICD-10-CM | POA: Insufficient documentation

## 2018-04-11 DIAGNOSIS — Z79899 Other long term (current) drug therapy: Secondary | ICD-10-CM

## 2018-04-11 DIAGNOSIS — M791 Myalgia, unspecified site: Secondary | ICD-10-CM | POA: Insufficient documentation

## 2018-04-11 DIAGNOSIS — D45 Polycythemia vera: Secondary | ICD-10-CM | POA: Insufficient documentation

## 2018-04-11 LAB — CMP (CANCER CENTER ONLY)
ALT: 53 U/L — ABNORMAL HIGH (ref 0–44)
AST: 48 U/L — ABNORMAL HIGH (ref 15–41)
Albumin: 4.7 g/dL (ref 3.5–5.0)
Alkaline Phosphatase: 110 U/L (ref 38–126)
Anion gap: 9 (ref 5–15)
BUN: 19 mg/dL (ref 6–20)
CO2: 27 mmol/L (ref 22–32)
Calcium: 9.3 mg/dL (ref 8.9–10.3)
Chloride: 106 mmol/L (ref 98–111)
Creatinine: 1.15 mg/dL (ref 0.61–1.24)
GFR, Est AFR Am: >60 mL/min (ref >60)
GFR, Estimated: >60 mL/min (ref >60)
Glucose, Bld: 96 mg/dL (ref 70–99)
Potassium: 4.3 mmol/L (ref 3.5–5.1)
Sodium: 142 mmol/L (ref 135–145)
Total Bilirubin: 1 mg/dL (ref 0.3–1.2)
Total Protein: 6.4 g/dL — ABNORMAL LOW (ref 6.5–8.1)

## 2018-04-11 LAB — CBC WITH DIFFERENTIAL (CANCER CENTER ONLY)
Abs Immature Granulocytes: 0.89 10*3/uL — ABNORMAL HIGH (ref 0.00–0.07)
Basophils Absolute: 0.4 10*3/uL — ABNORMAL HIGH (ref 0.0–0.1)
Basophils Relative: 2 %
Eosinophils Absolute: 0.6 10*3/uL — ABNORMAL HIGH (ref 0.0–0.5)
Eosinophils Relative: 4 %
HCT: 46.6 % (ref 39.0–52.0)
Hemoglobin: 14.7 g/dL (ref 13.0–17.0)
Immature Granulocytes: 6 %
Lymphocytes Relative: 10 %
Lymphs Abs: 1.5 10*3/uL (ref 0.7–4.0)
MCH: 25 pg — ABNORMAL LOW (ref 26.0–34.0)
MCHC: 31.5 g/dL (ref 30.0–36.0)
MCV: 79.4 fL — ABNORMAL LOW (ref 80.0–100.0)
Monocytes Absolute: 1.5 10*3/uL — ABNORMAL HIGH (ref 0.1–1.0)
Monocytes Relative: 10 %
Neutro Abs: 10.4 10*3/uL — ABNORMAL HIGH (ref 1.7–7.7)
Neutrophils Relative %: 68 %
Platelet Count: 243 10*3/uL (ref 150–400)
RBC: 5.87 MIL/uL — ABNORMAL HIGH (ref 4.22–5.81)
RDW: 20.7 % — ABNORMAL HIGH (ref 11.5–15.5)
WBC Count: 15.3 10*3/uL — ABNORMAL HIGH (ref 4.0–10.5)
nRBC: 1.4 % — ABNORMAL HIGH (ref 0.0–0.2)

## 2018-04-11 NOTE — Progress Notes (Signed)
Abbe Amsterdam presents today for phlebotomy per MD orders. Phlebotomy procedure started at 1517 and ended at 1533 with  515 grams removed via 20 g to R forearm. 2nd attempt. Patient observed for 30 minutes after procedure without any incident- with MD for a follow up visit. Diet and nutrition offered.  Patient tolerated procedure well. IV needle removed intact.

## 2018-04-11 NOTE — Progress Notes (Signed)
Hematology and Oncology Follow Up Visit  [name] [name] [id number] 11-Mar-1956 62 y.o. 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   Anagrelide 1.0 mg po BID -- start 02/12/2018     Interim History:  Mr.  [name] [name] is back for followup.  He is doing pretty well.  He is a little bit upset over the new billing policies of the [facility] healthcare system.  He wants to try to figure out the best way for him not to have to come to our office to often.  He had a nice Thanksgiving.  He will be busy over Christmas.  He is doing well with the anagrelide.  His platelet count is come down quite nicely.  We will keep him on the anagrelide at 1 mg p.o. twice daily.  He has had some palpitations.  He has noted some fluttering.  He sees his cardiologist in January.  His iron studies have shown somewhat marginal iron deficiency.  I told him that we have some "room" to bring his iron levels down further.  This will help decrease the amount of phlebotomies that we need to do.  Unfortunately, he cannot go to the [employer] for phlebotomies/donation because of his disease.  He has had no fever.  He has had no cough.  There is no change in bowel or bladder habits.  Overall, his performance status is ECOG 0.      Medications:  Current Outpatient Medications:  .  acetaminophen (TYLENOL) 500 MG tablet, Take 1,000 mg by mouth every 6 (six) hours as needed. For pain , Disp: , Rfl:  .  anagrelide (AGRYLIN) 1 MG capsule, Take 1 capsule (1 mg total) by mouth 2 (two) times daily., Disp: 60 capsule, Rfl: 3 .  aspirin 81 MG tablet, Take 81 mg by mouth daily.  , Disp: , Rfl:  .  buPROPion (WELLBUTRIN SR) 150 MG 12 hr tablet, Take 150 mg by mouth every  morning., Disp: , Rfl: 12 .  Fexofenadine HCl (MUCINEX ALLERGY PO), Take 1 tablet by mouth daily as needed. , Disp: , Rfl:  .  fluorouracil (EFUDEX) 5 % cream, , Disp: , Rfl:  .  fluticasone (FLONASE) 50 MCG/ACT nasal spray, Place into both nostrils daily., Disp: , Rfl:  .  hyoscyamine (LEVBID) 0.375 MG 12 hr tablet, Take 1 tablet (0.375 mg total) by mouth as needed., Disp: 60 tablet, Rfl: 4 .  propranolol ER (INDERAL LA) 120 MG 24 hr capsule, Take 1 capsule (120 mg total) by mouth daily., Disp: 90 capsule, Rfl: 3 .  zolpidem (AMBIEN CR) 12.5 MG CR tablet, Take by mouth as needed. For sleep pt usually takes 1/2 tablet, Disp: , Rfl:   Allergies: No Known Allergies  Past Medical History, Surgical history, Social history, and Family History were reviewed and updated.  Review of Systems: Review of Systems  Constitutional: Positive for malaise/fatigue.  HENT: Negative.   Eyes: Negative.   Respiratory: Negative.   Cardiovascular: Negative.   Gastrointestinal: Negative.   Genitourinary: Negative.   Musculoskeletal: Positive for myalgias.  Skin: Negative.   Neurological: Negative.   Endo/Heme/Allergies: Negative.   Psychiatric/Behavioral: Negative.      Physical Exam:  height is 6' (1.829 m) and weight  is 207 lb 12.8 oz (94.3 kg). His oral temperature is 98 F (36.7 C). His blood pressure is 129/78 and his pulse is 71. His respiration is 18 and oxygen saturation is 100%.   Physical Exam  Constitutional: He is oriented to person, place, and time.  HENT:  Head: Normocephalic and atraumatic.  Mouth/Throat: Oropharynx is clear and moist.  Eyes: Pupils are equal, round, and reactive to light. EOM are normal.  Neck: Normal range of motion.  Cardiovascular: Normal rate, regular rhythm and normal heart sounds.  Pulmonary/Chest: Effort normal and breath sounds normal.  Abdominal: Soft. Bowel sounds are normal.  Musculoskeletal: Normal range of motion. He exhibits no edema, tenderness or  deformity.  Lymphadenopathy:    He has no cervical adenopathy.  Neurological: He is alert and oriented to person, place, and time.  Skin: Skin is warm and dry. No rash noted. No erythema.  Psychiatric: He has a normal mood and affect. His behavior is normal. Judgment and thought content normal.  Vitals reviewed.    Lab Results  Component Value Date   WBC 15.3 (H) 04/11/2018   HGB 14.7 04/11/2018   HCT 46.6 04/11/2018   MCV 79.4 (L) 04/11/2018   PLT 243 04/11/2018     Chemistry      Component Value Date/Time   NA 142 04/11/2018 1434   NA 144 04/25/2017 1506   NA 139 04/12/2016 1113   K 4.3 04/11/2018 1434   K 4.2 04/25/2017 1506   K 4.1 04/12/2016 1113   CL 106 04/11/2018 1434   CL 105 04/25/2017 1506   CO2 27 04/11/2018 1434   CO2 29 04/25/2017 1506   CO2 22 04/12/2016 1113   BUN 19 04/11/2018 1434   BUN 17 04/25/2017 1506   BUN 16.3 04/12/2016 1113   CREATININE 1.15 04/11/2018 1434   CREATININE 1.2 04/25/2017 1506   CREATININE 0.9 04/12/2016 1113      Component Value Date/Time   CALCIUM 9.3 04/11/2018 1434   CALCIUM 9.5 04/25/2017 1506   CALCIUM 9.1 04/12/2016 1113   ALKPHOS 110 04/11/2018 1434   ALKPHOS 73 04/25/2017 1506   ALKPHOS 66 04/12/2016 1113   AST 48 (H) 04/11/2018 1434   AST 31 04/12/2016 1113   ALT 53 (H) 04/11/2018 1434   ALT 58 (H) 04/25/2017 1506   ALT 36 04/12/2016 1113   BILITOT 1.0 04/11/2018 1434   BILITOT 0.73 04/12/2016 1113         Impression and Plan: [name] [name] is 62 year old gentleman with polycythemia vera.  We will phlebotomize him today.  I will also set him up for a phlebotomy in 2 weeks.  I think by doing a phlebotomy in 2 weeks, we can stretch his appointments to see me to hopefully 7-8 weeks.  I do not see that we have to make any changes in his medication.  The anagrelide is doing a very good job keeping his platelet count down.  I will see him back at the end of January.      [name], MD 12/6/20193:16  PM

## 2018-04-14 LAB — FERRITIN: Ferritin: 48 ng/mL (ref 24–336)

## 2018-04-14 LAB — IRON AND TIBC
Iron: 108 ug/dL (ref 42–163)
Saturation Ratios: 26 % (ref 20–55)
TIBC: 409 ug/dL (ref 202–409)
UIBC: 301 ug/dL (ref 117–376)

## 2018-04-14 LAB — LACTATE DEHYDROGENASE: LDH: 707 U/L — ABNORMAL HIGH (ref 98–192)

## 2018-04-24 ENCOUNTER — Inpatient Hospital Stay: Payer: Managed Care, Other (non HMO)

## 2018-04-24 DIAGNOSIS — D45 Polycythemia vera: Secondary | ICD-10-CM | POA: Diagnosis not present

## 2018-04-24 NOTE — Progress Notes (Signed)
Abbe Amsterdam presents today for phlebotomy per MD orders. Phlebotomy procedure started at [place] and ended at 1550.   506 grams removed via phlebotomy kit to left ac. Patient observed for 30 minutes after procedure without any incident.  Snack and drink taken.  Patient tolerated procedure well. IV needle removed intact.

## 2018-04-24 NOTE — Patient Instructions (Signed)

## 2018-05-13 ENCOUNTER — Other Ambulatory Visit: Payer: Self-pay | Admitting: Hematology & Oncology

## 2018-05-29 ENCOUNTER — Inpatient Hospital Stay: Payer: Managed Care, Other (non HMO) | Attending: Hematology & Oncology | Admitting: Family

## 2018-05-29 ENCOUNTER — Telehealth: Payer: Self-pay | Admitting: *Deleted

## 2018-05-29 ENCOUNTER — Telehealth: Payer: Self-pay | Admitting: Family

## 2018-05-29 ENCOUNTER — Inpatient Hospital Stay: Payer: Managed Care, Other (non HMO)

## 2018-05-29 DIAGNOSIS — R202 Paresthesia of skin: Secondary | ICD-10-CM | POA: Diagnosis not present

## 2018-05-29 DIAGNOSIS — R0602 Shortness of breath: Secondary | ICD-10-CM | POA: Diagnosis not present

## 2018-05-29 DIAGNOSIS — Z7982 Long term (current) use of aspirin: Secondary | ICD-10-CM | POA: Diagnosis not present

## 2018-05-29 DIAGNOSIS — R0981 Nasal congestion: Secondary | ICD-10-CM | POA: Diagnosis not present

## 2018-05-29 DIAGNOSIS — D751 Secondary polycythemia: Secondary | ICD-10-CM

## 2018-05-29 DIAGNOSIS — R350 Frequency of micturition: Secondary | ICD-10-CM | POA: Diagnosis not present

## 2018-05-29 DIAGNOSIS — R002 Palpitations: Secondary | ICD-10-CM | POA: Diagnosis not present

## 2018-05-29 DIAGNOSIS — F419 Anxiety disorder, unspecified: Secondary | ICD-10-CM | POA: Diagnosis not present

## 2018-05-29 DIAGNOSIS — R200 Anesthesia of skin: Secondary | ICD-10-CM | POA: Insufficient documentation

## 2018-05-29 DIAGNOSIS — Z79899 Other long term (current) drug therapy: Secondary | ICD-10-CM

## 2018-05-29 DIAGNOSIS — D45 Polycythemia vera: Secondary | ICD-10-CM | POA: Insufficient documentation

## 2018-05-29 DIAGNOSIS — D500 Iron deficiency anemia secondary to blood loss (chronic): Secondary | ICD-10-CM

## 2018-05-29 DIAGNOSIS — R42 Dizziness and giddiness: Secondary | ICD-10-CM | POA: Diagnosis not present

## 2018-05-29 LAB — CMP (CANCER CENTER ONLY)
ALT: 25 U/L (ref 0–44)
AST: 31 U/L (ref 15–41)
Albumin: 4.7 g/dL (ref 3.5–5.0)
Alkaline Phosphatase: 84 U/L (ref 38–126)
Anion gap: 7 (ref 5–15)
BUN: 15 mg/dL (ref 6–20)
CO2: 28 mmol/L (ref 22–32)
Calcium: 9.2 mg/dL (ref 8.9–10.3)
Chloride: 104 mmol/L (ref 98–111)
Creatinine: 0.99 mg/dL (ref 0.61–1.24)
GFR, Est AFR Am: >60 mL/min (ref >60)
GFR, Estimated: >60 mL/min (ref >60)
Glucose, Bld: 106 mg/dL — ABNORMAL HIGH (ref 70–99)
Potassium: 3.7 mmol/L (ref 3.5–5.1)
Sodium: 139 mmol/L (ref 135–145)
Total Bilirubin: 0.9 mg/dL (ref 0.3–1.2)
Total Protein: 6.1 g/dL — ABNORMAL LOW (ref 6.5–8.1)

## 2018-05-29 LAB — CBC WITH DIFFERENTIAL (CANCER CENTER ONLY)
Abs Immature Granulocytes: 0.83 10*3/uL — ABNORMAL HIGH (ref 0.00–0.07)
Basophils Absolute: 0.3 10*3/uL — ABNORMAL HIGH (ref 0.0–0.1)
Basophils Relative: 2 %
Eosinophils Absolute: 0.6 10*3/uL — ABNORMAL HIGH (ref 0.0–0.5)
Eosinophils Relative: 4 %
HCT: 39.4 % (ref 39.0–52.0)
Hemoglobin: 13 g/dL (ref 13.0–17.0)
Immature Granulocytes: 6 %
Lymphocytes Relative: 9 %
Lymphs Abs: 1.2 10*3/uL (ref 0.7–4.0)
MCH: 26.2 pg (ref 26.0–34.0)
MCHC: 33 g/dL (ref 30.0–36.0)
MCV: 79.3 fL — ABNORMAL LOW (ref 80.0–100.0)
Monocytes Absolute: 1.1 10*3/uL — ABNORMAL HIGH (ref 0.1–1.0)
Monocytes Relative: 9 %
Neutro Abs: 9.3 10*3/uL — ABNORMAL HIGH (ref 1.7–7.7)
Neutrophils Relative %: 70 %
Platelet Count: 234 10*3/uL (ref 150–400)
RBC: 4.97 MIL/uL (ref 4.22–5.81)
RDW: 19.8 % — ABNORMAL HIGH (ref 11.5–15.5)
WBC Count: 13.3 10*3/uL — ABNORMAL HIGH (ref 4.0–10.5)
nRBC: 1.1 % — ABNORMAL HIGH (ref 0.0–0.2)

## 2018-05-29 NOTE — Telephone Encounter (Signed)
Pt is still having heart palpitations, had discussed heart monitor at last visit and wondered if we could go ahead with that now? Please advise.

## 2018-05-29 NOTE — Progress Notes (Signed)
Hematology and Oncology Follow Up Visit  [name] [name] [id number] 21-Oct-1955 63 y.o. 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  Anagrelide 1.0 mg po BID - start 02/12/2018   Interim History:  [name] [name] is here today for follow-up. Hct today is 39.4%. He is doing well on Anagrelide and aspirin daily.  No episodes of bleeding, no bruising or petechiae.  He states that he has some stress and anxiety associated with his disabled brother's healthy.  He has noted that his episodes of palpitations are becoming more frequent. He has also experienced some dizziness and SOB with these episodes.  He has seen [name] [name] with cardiology and is on propranolol but plans to follow-up with his office to let them know about the increase in frequency.  He has had no falls or syncopal episodes.  He is followed by dermatology for rosacea of the cheeks, more so on the left cheek at this time. He is using a cream daily.  He has some sinus drainage, congestion and dry cough during this time of year No fever, chills, n/v, rash, chest pain, abdominal pain or changes in bowel or bladder habits.  He varies between urinary frequency and delayed emptying. He states that he has an appointment coming up with [facility].  His appetite is ok but he admits that he could hydrate better at home.  No swelling in his extremities at this time.  He has intermittent numbness and tingling in his hands and will also have sometimes in his feet at night.   ECOG Performance Status: 1 - Symptomatic but completely ambulatory  Medications:  Allergies as of 05/29/2018   No Known Allergies     Medication List       Accurate as of May 29, 2018  3:13 PM.  Always use your most recent med list.        acetaminophen 500 MG tablet Commonly known as:  TYLENOL Take 1,000 mg by mouth every 6 (six) hours as needed. For pain   anagrelide 1 MG capsule Commonly known as:  AGRYLIN TAKE 1 CAPSULE (1 MG TOTAL) BY MOUTH 2 (TWO) TIMES DAILY.   aspirin 81 MG tablet Take 81 mg by mouth daily.   buPROPion 150 MG 12 hr tablet Commonly known as:  WELLBUTRIN SR Take 150 mg by mouth every morning.   fluorouracil 5 % cream Commonly known as:  EFUDEX   fluticasone 50 MCG/ACT nasal spray Commonly known as:  FLONASE Place into both nostrils daily.   hyoscyamine 0.375 MG 12 hr tablet Commonly known as:  LEVBID Take 1 tablet (0.375 mg total) by mouth as needed.   MUCINEX ALLERGY PO Take 1 tablet by mouth daily as needed.   propranolol ER 120 MG 24 hr capsule Commonly known as:  INDERAL LA Take 1 capsule (120 mg total) by mouth daily.   zolpidem 12.5 MG CR tablet Commonly known as:  AMBIEN CR Take by mouth as needed. For sleep pt usually takes 1/2 tablet       Allergies: No Known Allergies  Past Medical History, Surgical history, Social history, and Family History were reviewed and updated.  Review of Systems: All other 10 point review of systems is negative.   Physical Exam:  vitals  were not taken for this visit.   Wt Readings from Last 3 Encounters:  04/11/18 207 lb 12.8 oz (94.3 kg)  02/12/18 205 lb (93 kg)  02/12/18 206 lb 12.8 oz (93.8 kg)    Ocular: Sclerae unicteric, pupils equal, round and reactive to light Ear-nose-throat: Oropharynx clear, dentition fair Lymphatic: No cervical, supraclavicular or axillary adenopathy Lungs no rales or rhonchi, good excursion bilaterally Heart regular rate and rhythm, no murmur appreciated Abd soft, nontender, positive bowel sounds, no liver or spleen tip palpated on exam, no fluid wave  MSK no focal spinal tenderness, no joint edema Neuro: non-focal, well-oriented, appropriate  affect Breasts: Deferred   Lab Results  Component Value Date   WBC 15.3 (H) 04/11/2018   HGB 14.7 04/11/2018   HCT 46.6 04/11/2018   MCV 79.4 (L) 04/11/2018   PLT 243 04/11/2018   Lab Results  Component Value Date   FERRITIN 48 04/11/2018   IRON 108 04/11/2018   TIBC 409 04/11/2018   UIBC 301 04/11/2018   IRONPCTSAT 26 04/11/2018   Lab Results  Component Value Date   RETICCTPCT 1.4 02/21/2011   RBC 5.87 (H) 04/11/2018   RETICCTABS 87.6 02/21/2011   No results found for: KPAFRELGTCHN, LAMBDASER, KAPLAMBRATIO No results found for: IGGSERUM, IGA, IGMSERUM No results found for: Kathrynn Ducking, MSPIKE, SPEI   Chemistry      Component Value Date/Time   NA 142 04/11/2018 1434   NA 144 04/25/2017 1506   NA 139 04/12/2016 1113   K 4.3 04/11/2018 1434   K 4.2 04/25/2017 1506   K 4.1 04/12/2016 1113   CL 106 04/11/2018 1434   CL 105 04/25/2017 1506   CO2 27 04/11/2018 1434   CO2 29 04/25/2017 1506   CO2 22 04/12/2016 1113   BUN 19 04/11/2018 1434   BUN 17 04/25/2017 1506   BUN 16.3 04/12/2016 1113   CREATININE 1.15 04/11/2018 1434   CREATININE 1.2 04/25/2017 1506   CREATININE 0.9 04/12/2016 1113      Component Value Date/Time   CALCIUM 9.3 04/11/2018 1434   CALCIUM 9.5 04/25/2017 1506   CALCIUM 9.1 04/12/2016 1113   ALKPHOS 110 04/11/2018 1434   ALKPHOS 73 04/25/2017 1506   ALKPHOS 66 04/12/2016 1113   AST 48 (H) 04/11/2018 1434   AST 31 04/12/2016 1113   ALT 53 (H) 04/11/2018 1434   ALT 58 (H) 04/25/2017 1506   ALT 36 04/12/2016 1113   BILITOT 1.0 04/11/2018 1434   BILITOT 0.73 04/12/2016 1113       Impression and Plan: [name] [name] is a very pleasant 63 yo caucasian gentleman with polycythemia vera, JAK2 positive.  Hct today is 39.4% so no phlebotomy needed at this time.  He will continue his same regimen with Anagrelide and baby aspirin daily.  He states that he will follow-up with [name] [name] and let him know  that his palpitations are more frequent and he is also having some associated dizziness and SOB.  We will plan to see him back in another 6 weeks.  He will contact our office with any questions or concerns. We can certainly see her sooner if need be.   [name] Peace, NP 1/23/20203:13 PM

## 2018-05-29 NOTE — Telephone Encounter (Signed)
Appointments scheduled calendar printed/ AVS declined per 1/23 los

## 2018-05-30 LAB — IRON AND TIBC
Iron: 77 ug/dL (ref 42–163)
Saturation Ratios: 18 % — ABNORMAL LOW (ref 20–55)
TIBC: 424 ug/dL — ABNORMAL HIGH (ref 202–409)
UIBC: 346 ug/dL (ref 117–376)

## 2018-05-30 LAB — LACTATE DEHYDROGENASE: LDH: 687 U/L — ABNORMAL HIGH (ref 98–192)

## 2018-05-30 LAB — FERRITIN: Ferritin: 22 ng/mL — ABNORMAL LOW (ref 24–336)

## 2018-06-03 NOTE — Telephone Encounter (Signed)
Spoke with pt, intermittent but more frequent now. He had talked with dr Stanford Breed about a monitor at his last office visit and he would like to get the monitor now. Order placed and patient scheduled for monitor. Location discussed in detail with the patient.

## 2018-06-03 NOTE — Addendum Note (Signed)
Addended by: Cristopher Estimable on: 06/03/2018 08:47 AM   Modules accepted: Orders

## 2018-06-16 ENCOUNTER — Ambulatory Visit (INDEPENDENT_AMBULATORY_CARE_PROVIDER_SITE_OTHER): Payer: Managed Care, Other (non HMO)

## 2018-06-16 DIAGNOSIS — R002 Palpitations: Secondary | ICD-10-CM

## 2018-06-28 ENCOUNTER — Telehealth: Payer: Self-pay | Admitting: Cardiology

## 2018-06-28 NOTE — Telephone Encounter (Signed)
Called by the monitor company.  The patient apparently had "30 seconds" of atrial fibrillation with a rate of 100.  The patient is a CHA DS VASC 1 for hypertension.  We will see if we can review the monitor strips and continue with his Holter monitor before committing him to systemic anticoagulation.  I'll copy this to Dr. Stanford Breed .  Kerin Ransom PA-C 06/28/2018 3:24 PM

## 2018-06-30 ENCOUNTER — Telehealth: Payer: Self-pay | Admitting: *Deleted

## 2018-06-30 NOTE — Telephone Encounter (Signed)
Telephone   06/28/2018 [place], PA-C  Cardiology   Conversation  (Newest Message First)  Ilean China       06/28/18 3:24 PM  Note    Called by the monitor company.  The patient apparently had "30 seconds" of atrial fibrillation with a rate of 100.  The patient is a CHA DS VASC 1 for hypertension.  We will see if we can review the monitor strips and continue with his Holter monitor before committing him to systemic anticoagulation.  I'll copy this to Dr. Stanford Breed .  Kerin Ransom PA-C 06/28/2018 3:24 PM     Additional Documentation   Encounter Info:   Billing Info,   History,   Allergies,   Detailed Report     Orders Placed    None  Medication Renewals and Changes     None    Medication List   Visit Diagnoses     None    Problem List

## 2018-06-30 NOTE — Telephone Encounter (Signed)
Lm to call back . Per Dr Stanford Breed left recordings with Fredia Beets RN for review./cy

## 2018-07-02 NOTE — Telephone Encounter (Signed)
Left message for patient to call to schedule follow up appointment with dr Stanford Breed in [place] per review of monitor strips by dr Stanford Breed.

## 2018-07-04 NOTE — Telephone Encounter (Signed)
Spoke with pt, Follow up scheduled  

## 2018-07-10 ENCOUNTER — Inpatient Hospital Stay: Payer: Managed Care, Other (non HMO) | Attending: Hematology & Oncology

## 2018-07-10 ENCOUNTER — Inpatient Hospital Stay (HOSPITAL_BASED_OUTPATIENT_CLINIC_OR_DEPARTMENT_OTHER): Payer: Managed Care, Other (non HMO) | Admitting: Family

## 2018-07-10 VITALS — BP 162/78 | HR 59 | Temp 98.3°F | Resp 17

## 2018-07-10 DIAGNOSIS — Z79899 Other long term (current) drug therapy: Secondary | ICD-10-CM | POA: Insufficient documentation

## 2018-07-10 DIAGNOSIS — E611 Iron deficiency: Secondary | ICD-10-CM | POA: Insufficient documentation

## 2018-07-10 DIAGNOSIS — D45 Polycythemia vera: Secondary | ICD-10-CM | POA: Insufficient documentation

## 2018-07-10 DIAGNOSIS — Z7982 Long term (current) use of aspirin: Secondary | ICD-10-CM

## 2018-07-10 DIAGNOSIS — D500 Iron deficiency anemia secondary to blood loss (chronic): Secondary | ICD-10-CM

## 2018-07-10 DIAGNOSIS — D751 Secondary polycythemia: Secondary | ICD-10-CM

## 2018-07-10 DIAGNOSIS — I10 Essential (primary) hypertension: Secondary | ICD-10-CM | POA: Insufficient documentation

## 2018-07-10 LAB — CMP (CANCER CENTER ONLY)
ALT: 30 U/L (ref 0–44)
AST: 31 U/L (ref 15–41)
Albumin: 4.8 g/dL (ref 3.5–5.0)
Alkaline Phosphatase: 83 U/L (ref 38–126)
Anion gap: 7 (ref 5–15)
BUN: 21 mg/dL — ABNORMAL HIGH (ref 6–20)
CO2: 28 mmol/L (ref 22–32)
Calcium: 9.4 mg/dL (ref 8.9–10.3)
Chloride: 107 mmol/L (ref 98–111)
Creatinine: 1.1 mg/dL (ref 0.61–1.24)
GFR, Est AFR Am: >60 mL/min (ref >60)
GFR, Estimated: >60 mL/min (ref >60)
Glucose, Bld: 92 mg/dL (ref 70–99)
Potassium: 4 mmol/L (ref 3.5–5.1)
Sodium: 142 mmol/L (ref 135–145)
Total Bilirubin: 1.6 mg/dL — ABNORMAL HIGH (ref 0.3–1.2)
Total Protein: 6.6 g/dL (ref 6.5–8.1)

## 2018-07-10 LAB — CBC WITH DIFFERENTIAL (CANCER CENTER ONLY)
Abs Immature Granulocytes: 0.89 10*3/uL — ABNORMAL HIGH (ref 0.00–0.07)
Basophils Absolute: 0.3 10*3/uL — ABNORMAL HIGH (ref 0.0–0.1)
Basophils Relative: 2 %
Eosinophils Absolute: 0.5 10*3/uL (ref 0.0–0.5)
Eosinophils Relative: 4 %
HCT: 38.9 % — ABNORMAL LOW (ref 39.0–52.0)
Hemoglobin: 12.7 g/dL — ABNORMAL LOW (ref 13.0–17.0)
Immature Granulocytes: 8 %
Lymphocytes Relative: 10 %
Lymphs Abs: 1.1 10*3/uL (ref 0.7–4.0)
MCH: 26.6 pg (ref 26.0–34.0)
MCHC: 32.6 g/dL (ref 30.0–36.0)
MCV: 81.4 fL (ref 80.0–100.0)
Monocytes Absolute: 1.2 10*3/uL — ABNORMAL HIGH (ref 0.1–1.0)
Monocytes Relative: 10 %
Neutro Abs: 7.7 10*3/uL (ref 1.7–7.7)
Neutrophils Relative %: 66 %
Platelet Count: 178 10*3/uL (ref 150–400)
RBC: 4.78 MIL/uL (ref 4.22–5.81)
RDW: 19.9 % — ABNORMAL HIGH (ref 11.5–15.5)
WBC Count: 11.7 10*3/uL — ABNORMAL HIGH (ref 4.0–10.5)
nRBC: 1 % — ABNORMAL HIGH (ref 0.0–0.2)

## 2018-07-10 NOTE — Progress Notes (Signed)
Hematology and Oncology Follow Up Visit  [name] [name] [id number] December 04, 1955 63 y.o. 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  Anagrelide 1.0 mg po BID - start 02/12/2018   Interim History:  [name] is here today for follow-up. He has sinus congestion and states that he has been taking Allegra.  He has noted headaches and intermittent HTN at home. We discussed that this may be related to the decongestants he has been taking for his sinus congestion. I suggested he follow-up with his PCP for further management.  He is seeing cardiology Dr. Stanford Breed for palpitations and episodes of lightheadedness. He is currently wearing a Holter monitor and will see [place] again in 2 weeks to go over the results.  He is doing well on Anagrelide as well as aspirin and states that he is taking as prescribed. He denies having any episodes of bleeding, no bruising or petechiae.  No fever, chills, n/v, cough, rash, SOB, chest pain, abdominal pain or changes in bowel or bladder habits.  He has occasional episodes of numbness and tingling in his hands and feet. He states that he has mild carpal tunnel in his hands.  The puffiness in his feet and ankles comes and goes. This improves when he elevates his feet.  His appetite comes and goes. He admits that he could better hydrate.  His weight is stable.  He is still stressed at home and work but states that this has improved since he sold a house last week.   ECOG Performance Status: 1 - Symptomatic but completely ambulatory  Medications:  Allergies as of 07/10/2018   No Known Allergies     Medication List       Accurate as of July 10, 2018 10:30 PM. Always use your most recent  med list.        acetaminophen 500 MG tablet Commonly known as:  TYLENOL Take 1,000 mg by mouth every 6 (six) hours as needed. For pain   anagrelide 1 MG capsule Commonly known as:  AGRYLIN TAKE 1 CAPSULE (1 MG TOTAL) BY MOUTH 2 (TWO) TIMES DAILY.   aspirin 81 MG tablet Take 81 mg by mouth daily.   buPROPion 150 MG 12 hr tablet Commonly known as:  WELLBUTRIN SR Take 150 mg by mouth every morning.   fluorouracil 5 % cream Commonly known as:  EFUDEX   fluticasone 50 MCG/ACT nasal spray Commonly known as:  FLONASE Place into both nostrils daily.   hyoscyamine 0.375 MG 12 hr tablet Commonly known as:  LEVBID Take 1 tablet (0.375 mg total) by mouth as needed.   MUCINEX ALLERGY PO Take 1 tablet by mouth daily as needed.   propranolol ER 120 MG 24 hr capsule Commonly known as:  INDERAL LA Take 1 capsule (120 mg total) by mouth daily.   zolpidem 12.5 MG CR tablet Commonly known as:  AMBIEN CR Take by mouth as needed. For sleep pt usually takes 1/2 tablet       Allergies: No Known Allergies  Past Medical History, Surgical history, Social history, and Family History were reviewed and updated.  Review of Systems: All other 10 point review of systems is negative.   Physical Exam:  oral temperature is 98.3 F (  36.8 C). His blood pressure is 162/78 (abnormal) and his pulse is 59 (abnormal). His respiration is 17 and oxygen saturation is 98%.   Wt Readings from Last 3 Encounters:  04/11/18 207 lb 12.8 oz (94.3 kg)  02/12/18 205 lb (93 kg)  02/12/18 206 lb 12.8 oz (93.8 kg)    Ocular: Sclerae unicteric, pupils equal, round and reactive to light Ear-nose-throat: Oropharynx clear, dentition fair Lymphatic: No cervical, supraclavicular or axillary adenopathy Lungs no rales or rhonchi, good excursion bilaterally Heart regular rate and rhythm, no murmur appreciated Abd soft, nontender, positive bowel sounds, no liver or spleen tip palpated on exam, no fluid wave  MSK no  focal spinal tenderness, no joint edema Neuro: non-focal, well-oriented, appropriate affect Breasts: Deferred   Lab Results  Component Value Date   WBC 11.7 (H) 07/10/2018   HGB 12.7 (L) 07/10/2018   HCT 38.9 (L) 07/10/2018   MCV 81.4 07/10/2018   PLT 178 07/10/2018   Lab Results  Component Value Date   FERRITIN 22 (L) 05/29/2018   IRON 77 05/29/2018   TIBC 424 (H) 05/29/2018   UIBC 346 05/29/2018   IRONPCTSAT 18 (L) 05/29/2018   Lab Results  Component Value Date   RETICCTPCT 1.4 02/21/2011   RBC 4.78 07/10/2018   RETICCTABS 87.6 02/21/2011   No results found for: KPAFRELGTCHN, LAMBDASER, KAPLAMBRATIO No results found for: IGGSERUM, IGA, IGMSERUM No results found for: Odetta Pink, SPEI   Chemistry      Component Value Date/Time   NA 142 07/10/2018 1454   NA 144 04/25/2017 1506   NA 139 04/12/2016 1113   K 4.0 07/10/2018 1454   K 4.2 04/25/2017 1506   K 4.1 04/12/2016 1113   CL 107 07/10/2018 1454   CL 105 04/25/2017 1506   CO2 28 07/10/2018 1454   CO2 29 04/25/2017 1506   CO2 22 04/12/2016 1113   BUN 21 (H) 07/10/2018 1454   BUN 17 04/25/2017 1506   BUN 16.3 04/12/2016 1113   CREATININE 1.10 07/10/2018 1454   CREATININE 1.2 04/25/2017 1506   CREATININE 0.9 04/12/2016 1113      Component Value Date/Time   CALCIUM 9.4 07/10/2018 1454   CALCIUM 9.5 04/25/2017 1506   CALCIUM 9.1 04/12/2016 1113   ALKPHOS 83 07/10/2018 1454   ALKPHOS 73 04/25/2017 1506   ALKPHOS 66 04/12/2016 1113   AST 31 07/10/2018 1454   AST 31 04/12/2016 1113   ALT 30 07/10/2018 1454   ALT 58 (H) 04/25/2017 1506   ALT 36 04/12/2016 1113   BILITOT 1.6 (H) 07/10/2018 1454   BILITOT 0.73 04/12/2016 1113       Impression and Plan: [name] is a very pleasant 63 yo caucasian gentleman with polycythemia vera, JAK2 positive.  His Hct remains stable at 38.9% so no phlebotomy needed at this time.  He will continue his same regimen  with Anagrelide.  We will plan to see him back in another 2 months.  He will contact our office with any questions or concerns. We can certainly see him sooner if need be.   Laverna Peace, NP 3/5/202010:30 PM

## 2018-07-11 ENCOUNTER — Telehealth: Payer: Self-pay | Admitting: Family

## 2018-07-11 LAB — IRON AND TIBC
Iron: 159 ug/dL (ref 45–182)
Saturation Ratios: 38 % (ref 17.9–39.5)
TIBC: 417 ug/dL (ref 250–450)
UIBC: 258 ug/dL

## 2018-07-11 LAB — FERRITIN: Ferritin: 78 ng/mL (ref 24–336)

## 2018-07-11 NOTE — Telephone Encounter (Signed)
Appointments scheduled calendar printed per 3/5 los

## 2018-07-14 LAB — LACTATE DEHYDROGENASE: LDH: 626 U/L — ABNORMAL HIGH (ref 98–192)

## 2018-07-16 NOTE — Progress Notes (Signed)
HPI: FU palpitations and PAF. Stress echocardiogram June 2011 normal.  TSH at that time also normal.  CardioNet revealed sinus rhythm with occasional PACs and PVCs.  Last echocardiogram October 2019 showed normal LV function, mild left ventricular hypertrophy.  Monitor February 2020 revealed sinus bradycardia, NSR, sinus tach, pacs, PVC, brief PAT and PAF.  Since last seen patient continues to have occasional palpitations lasting approximately 1 minute.  He has the sensation where he can take a deep breath at times but denies significant dyspnea on exertion.  No chest pain or syncope.  Current Outpatient Medications  Medication Sig Dispense Refill  . acetaminophen (TYLENOL) 500 MG tablet Take 1,000 mg by mouth every 6 (six) hours as needed. For pain     . amoxicillin (AMOXIL) 500 MG capsule Take 500 mg by mouth 3 (three) times daily.    Marland Kitchen anagrelide (AGRYLIN) 1 MG capsule TAKE 1 CAPSULE (1 MG TOTAL) BY MOUTH 2 (TWO) TIMES DAILY. 60 capsule 3  . aspirin 81 MG tablet Take 81 mg by mouth daily.      Marland Kitchen buPROPion (WELLBUTRIN SR) 150 MG 12 hr tablet Take 150 mg by mouth every morning.  12  . fluorouracil (EFUDEX) 5 % cream     . fluticasone (FLONASE) 50 MCG/ACT nasal spray Place into both nostrils daily.    Marland Kitchen ipratropium (ATROVENT) 0.06 % nasal spray Place 1 spray into the nose daily.    . propranolol ER (INDERAL LA) 120 MG 24 hr capsule Take 1 capsule (120 mg total) by mouth daily. 90 capsule 3  . zolpidem (AMBIEN CR) 12.5 MG CR tablet Take by mouth as needed. For sleep pt usually takes 1/2 tablet     No current facility-administered medications for this visit.      Past Medical History:  Diagnosis Date  . Diverticulosis of colon (without mention of hemorrhage)   . Hemorrhoids   . Hypertension   . IBS (irritable bowel syndrome)   . Iron deficiency anemia due to chronic blood loss 09/26/2017  . Irregular heartbeat   . Nephrolithiasis   . Other and unspecified hyperlipidemia   .  Perirectal fistula   . Polycythemia, secondary   . Stye    blocker duct right eye lid    Past Surgical History:  Procedure Laterality Date  . RETINAL DETACHMENT SURGERY    . ROTATOR CUFF REPAIR     rt.  Marland Kitchen VASECTOMY      Social History   Socioeconomic History  . Marital status: Married    Spouse name: Not on file  . Number of children: 2  . Years of education: Not on file  . Highest education level: Not on file  Occupational History  . Occupation: Lobbyist: [employer]  Social Needs  . Financial resource strain: Not on file  . Food insecurity:    Worry: Not on file    Inability: Not on file  . Transportation needs:    Medical: Not on file    Non-medical: Not on file  Tobacco Use  . Smoking status: Never Smoker  . Smokeless tobacco: Never Used  . Tobacco comment: never used tobacco  Substance and Sexual Activity  . Alcohol use: Yes    Alcohol/week: 0.0 standard drinks    Comment: Occasional  . Drug use: No  . Sexual activity: Not on file  Lifestyle  . Physical activity:    Days per week: Not on file  Minutes per session: Not on file  . Stress: Not on file  Relationships  . Social connections:    Talks on phone: Not on file    Gets together: Not on file    Attends religious service: Not on file    Active member of club or organization: Not on file    Attends meetings of clubs or organizations: Not on file    Relationship status: Not on file  . Intimate partner violence:    Fear of current or ex partner: Not on file    Emotionally abused: Not on file    Physically abused: Not on file    Forced sexual activity: Not on file  Other Topics Concern  . Not on file  Social History Narrative  . Not on file    Family History  Problem Relation Age of Onset  . Aortic aneurysm Father   . Heart attack Brother   . Colon cancer Neg Hx   . Esophageal cancer Neg Hx   . Pancreatic cancer Neg Hx   . Prostate cancer Neg Hx   . Rectal cancer Neg  Hx   . Stomach cancer Neg Hx     ROS: no fevers or chills, productive cough, hemoptysis, dysphasia, odynophagia, melena, hematochezia, dysuria, hematuria, rash, seizure activity, orthopnea, PND, pedal edema, claudication. Remaining systems are negative.  Physical Exam: Well-developed well-nourished in no acute distress.  Skin is warm and dry.  HEENT is normal.  Neck is supple.  Chest is clear to auscultation with normal expansion.  Cardiovascular exam is regular rate and rhythm.  Abdominal exam nontender or distended. No masses palpated. Extremities show no edema. neuro grossly intact  ECG-sinus rhythm at a rate of 66, no ST changes.  Personally reviewed  A/P  1 paroxysmal atrial fibrillation-patient is in sinus rhythm today.  However he continues to have palpitations.  Brief episode of atrial fibrillation on event monitor.  I have recommended alive core to better assess his palpitations.  Increase Inderal LA to 160 mg daily.  Check TSH. CHADSvasc 1 for hypertension.  However he also has polycythemia which could increase his risk for clotting.  I will discontinue aspirin and treat with apixaban 5 mg twice daily.  Note LV function is normal.  If he has documented episodes in the future that are frequent we can consider an antiarrhythmic versus referral for ablation.  2 hypertension-patient's blood pressure is borderline.  We are increasing Inderal LA both for blood pressure and rate control of his atrial fibrillation.  Follow blood pressure and add additional medications as needed.  3 hyperlipidemia-managed by primary care.  Kirk Ruths, MD

## 2018-07-23 ENCOUNTER — Other Ambulatory Visit: Payer: Self-pay

## 2018-07-23 ENCOUNTER — Encounter: Payer: Self-pay | Admitting: Cardiology

## 2018-07-23 ENCOUNTER — Ambulatory Visit (INDEPENDENT_AMBULATORY_CARE_PROVIDER_SITE_OTHER): Payer: Managed Care, Other (non HMO) | Admitting: Cardiology

## 2018-07-23 VITALS — BP 140/84 | HR 66 | Ht 72.0 in | Wt 207.1 lb

## 2018-07-23 DIAGNOSIS — R002 Palpitations: Secondary | ICD-10-CM

## 2018-07-23 DIAGNOSIS — I480 Paroxysmal atrial fibrillation: Secondary | ICD-10-CM

## 2018-07-23 DIAGNOSIS — E7800 Pure hypercholesterolemia, unspecified: Secondary | ICD-10-CM

## 2018-07-23 DIAGNOSIS — I10 Essential (primary) hypertension: Secondary | ICD-10-CM | POA: Diagnosis not present

## 2018-07-23 MED ORDER — PROPRANOLOL HCL ER 160 MG PO CP24: 160.0000 mg | ORAL_CAPSULE | Freq: Every day | ORAL | 3 refills | Status: DC

## 2018-07-23 MED ORDER — APIXABAN 5 MG PO TABS: 5.0000 mg | ORAL_TABLET | Freq: Two times a day (BID) | ORAL | 6 refills | Status: DC

## 2018-07-23 NOTE — Patient Instructions (Addendum)
Medication Instructions:   STOP ASPIRIN  START ELIQUIS 5 MG ONE TABLET TWICE DAILY  INCREASE INDERAL TO 160 MG ONCE DAILY  Labwork:  Your physician recommends that you HAVE LAB WORK TODAY  Follow-Up:  Your physician recommends that you schedule a follow-up appointment in: [name] [name]

## 2018-07-24 ENCOUNTER — Encounter: Payer: Self-pay | Admitting: *Deleted

## 2018-07-24 LAB — TSH: TSH: 3.25 u[IU]/mL (ref 0.450–4.500)

## 2018-07-29 ENCOUNTER — Telehealth: Payer: Self-pay | Admitting: *Deleted

## 2018-07-29 NOTE — Telephone Encounter (Signed)
Patient has been started on Eliquis by his cardiologist. He is also on anagrelide from this office. He wants to make sure he can take both medications.   Spoke to Dr Marin Olp. He would like patient to continue taking anagrelide without any concern related to the Eliquis.   Patient is aware of Dr Antonieta Pert approval.

## 2018-09-04 ENCOUNTER — Inpatient Hospital Stay: Payer: Managed Care, Other (non HMO)

## 2018-09-04 ENCOUNTER — Encounter: Payer: Self-pay | Admitting: Hematology & Oncology

## 2018-09-04 ENCOUNTER — Inpatient Hospital Stay: Payer: Managed Care, Other (non HMO) | Attending: Hematology & Oncology

## 2018-09-04 ENCOUNTER — Inpatient Hospital Stay (HOSPITAL_BASED_OUTPATIENT_CLINIC_OR_DEPARTMENT_OTHER): Payer: Managed Care, Other (non HMO) | Admitting: Hematology & Oncology

## 2018-09-04 ENCOUNTER — Other Ambulatory Visit: Payer: Self-pay

## 2018-09-04 VITALS — BP 142/75 | HR 72 | Temp 98.7°F | Resp 18 | Wt 226.0 lb

## 2018-09-04 DIAGNOSIS — I4892 Unspecified atrial flutter: Secondary | ICD-10-CM

## 2018-09-04 DIAGNOSIS — D45 Polycythemia vera: Secondary | ICD-10-CM | POA: Diagnosis not present

## 2018-09-04 DIAGNOSIS — Z79899 Other long term (current) drug therapy: Secondary | ICD-10-CM | POA: Diagnosis not present

## 2018-09-04 DIAGNOSIS — R109 Unspecified abdominal pain: Secondary | ICD-10-CM | POA: Diagnosis not present

## 2018-09-04 DIAGNOSIS — D500 Iron deficiency anemia secondary to blood loss (chronic): Secondary | ICD-10-CM

## 2018-09-04 DIAGNOSIS — D751 Secondary polycythemia: Secondary | ICD-10-CM

## 2018-09-04 DIAGNOSIS — R002 Palpitations: Secondary | ICD-10-CM | POA: Insufficient documentation

## 2018-09-04 DIAGNOSIS — Z7901 Long term (current) use of anticoagulants: Secondary | ICD-10-CM | POA: Insufficient documentation

## 2018-09-04 LAB — CMP (CANCER CENTER ONLY)
ALT: 29 U/L (ref 0–44)
AST: 34 U/L (ref 15–41)
Albumin: 4.7 g/dL (ref 3.5–5.0)
Alkaline Phosphatase: 136 U/L — ABNORMAL HIGH (ref 38–126)
Anion gap: 9 (ref 5–15)
BUN: 16 mg/dL (ref 8–23)
CO2: 25 mmol/L (ref 22–32)
Calcium: 9.2 mg/dL (ref 8.9–10.3)
Chloride: 104 mmol/L (ref 98–111)
Creatinine: 0.93 mg/dL (ref 0.61–1.24)
GFR, Est AFR Am: >60 mL/min (ref >60)
GFR, Estimated: >60 mL/min (ref >60)
Glucose, Bld: 108 mg/dL — ABNORMAL HIGH (ref 70–99)
Potassium: 3.9 mmol/L (ref 3.5–5.1)
Sodium: 138 mmol/L (ref 135–145)
Total Bilirubin: 1 mg/dL (ref 0.3–1.2)
Total Protein: 6.6 g/dL (ref 6.5–8.1)

## 2018-09-04 LAB — CBC WITH DIFFERENTIAL (CANCER CENTER ONLY)
Abs Immature Granulocytes: 1.02 10*3/uL — ABNORMAL HIGH (ref 0.00–0.07)
Basophils Absolute: 0.4 10*3/uL — ABNORMAL HIGH (ref 0.0–0.1)
Basophils Relative: 3 %
Eosinophils Absolute: 0.6 10*3/uL — ABNORMAL HIGH (ref 0.0–0.5)
Eosinophils Relative: 4 %
HCT: 36.3 % — ABNORMAL LOW (ref 39.0–52.0)
Hemoglobin: 12.1 g/dL — ABNORMAL LOW (ref 13.0–17.0)
Immature Granulocytes: 7 %
Lymphocytes Relative: 10 %
Lymphs Abs: 1.4 10*3/uL (ref 0.7–4.0)
MCH: 27.6 pg (ref 26.0–34.0)
MCHC: 33.3 g/dL (ref 30.0–36.0)
MCV: 82.7 fL (ref 80.0–100.0)
Monocytes Absolute: 1.2 10*3/uL — ABNORMAL HIGH (ref 0.1–1.0)
Monocytes Relative: 8 %
Neutro Abs: 9.8 10*3/uL — ABNORMAL HIGH (ref 1.7–7.7)
Neutrophils Relative %: 68 %
Platelet Count: 190 10*3/uL (ref 150–400)
RBC: 4.39 MIL/uL (ref 4.22–5.81)
RDW: 20 % — ABNORMAL HIGH (ref 11.5–15.5)
WBC Count: 14.4 10*3/uL — ABNORMAL HIGH (ref 4.0–10.5)
nRBC: 1.7 % — ABNORMAL HIGH (ref 0.0–0.2)

## 2018-09-04 NOTE — Addendum Note (Signed)
Addended by: Melton Krebs on: 09/04/2018 04:03 PM   Modules accepted: Orders

## 2018-09-04 NOTE — Progress Notes (Signed)
Hematology and Oncology Follow Up Visit  [name] [name] [id number] [birth year] 63 y.o. 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  Anagrelide 1.0 mg po q day - change on 09/04/2018   Interim History:  [name] [name] is here today for follow-up.  A lot has happened to him since we last saw him.  He is still having issues with complications.  He seemed to be having an episode of atrial flutter.  His cardiologist went ahead and put him on Eliquis.  This was in March.  He stopped the aspirin.  He still is having some palpitations and fluttering.  He is on propranolol.  He might need to actually have a cardiac cath to see exactly what is going on with his heart valves and heart chambers.  He is still working despite the coronavirus.  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  He feels little bit full over on the left side.  His spleen does feel a bit larger on exam.  We will have to see about doing an ultrasound on him.  Today is his birthday.  Thankfully, he does not need to be phlebotomized.  Unfortunately, he will not be able to go out to dinner since all the restaurants are closed.  He has had no fever.  He has had no cough.  He has had no change in bowel or bladder habits.  He has had no nausea or vomiting.  Overall, his performance status is ECOG 1.   Medications:  Allergies as of 09/04/2018   No Known Allergies     Medication List       Accurate as of September 04, 2018  3:46 PM. Always use your most recent med list.        acetaminophen 500 MG tablet Commonly known as:  TYLENOL Take 1,000 mg by mouth every 6 (six) hours as  needed. For pain   amoxicillin 500 MG capsule Commonly known as:  AMOXIL Take 500 mg by mouth 3 (three) times daily.   anagrelide 1 MG capsule Commonly known as:  AGRYLIN TAKE 1 CAPSULE (1 MG TOTAL) BY MOUTH 2 (TWO) TIMES DAILY.   apixaban 5 MG Tabs tablet Commonly known as:  ELIQUIS Take 1 tablet (5 mg total) by mouth 2 (two) times daily.   buPROPion 150 MG 12 hr tablet Commonly known as:  WELLBUTRIN SR Take 150 mg by mouth every morning.   fluorouracil 5 % cream Commonly known as:  EFUDEX   fluticasone 50 MCG/ACT nasal spray Commonly known as:  FLONASE Place into both nostrils daily.   ipratropium 0.06 % nasal spray Commonly known as:  ATROVENT Place 1 spray into the nose daily.   propranolol ER 160 MG SR capsule Commonly known as:  INDERAL LA Take 1 capsule (160 mg total) by mouth daily.   zolpidem 12.5 MG CR tablet Commonly known as:  AMBIEN CR Take by mouth as needed. For sleep pt usually takes 1/2 tablet       Allergies: No Known Allergies  Past Medical History, Surgical history, Social history, and Family History were reviewed and updated.  Review of Systems: Review of  Systems  Constitutional: Negative.   HENT: Negative.   Eyes: Negative.   Respiratory: Negative.   Cardiovascular: Positive for palpitations.  Gastrointestinal: Positive for abdominal pain.  Genitourinary: Negative.   Musculoskeletal: Negative.   Skin: Negative.   Neurological: Negative.   Endo/Heme/Allergies: Negative.   Psychiatric/Behavioral: Negative.      Physical Exam:  vitals were not taken for this visit.   Wt Readings from Last 3 Encounters:  07/23/18 207 lb 1.9 oz (93.9 kg)  04/11/18 207 lb 12.8 oz (94.3 kg)  02/12/18 205 lb (93 kg)    Physical Exam Vitals signs reviewed.  HENT:     Head: Normocephalic and atraumatic.  Eyes:     Pupils: Pupils are equal, round, and reactive to light.  Neck:     Musculoskeletal: Normal range of motion.  Cardiovascular:      Rate and Rhythm: Normal rate and regular rhythm.     Heart sounds: Normal heart sounds.  Pulmonary:     Effort: Pulmonary effort is normal.     Breath sounds: Normal breath sounds.  Abdominal:     General: Bowel sounds are normal.     Palpations: Abdomen is soft.     Comments: Abdominal exam shows a soft abdomen with good bowel sounds.  He has no fluid wave.  There is no guarding or rebound tenderness.  There is no obvious hepatomegaly.  His spleen tip does feel about 3 or 4 cm below the left costal margin.  Musculoskeletal: Normal range of motion.        General: No tenderness or deformity.  Lymphadenopathy:     Cervical: No cervical adenopathy.  Skin:    General: Skin is warm and dry.     Findings: No erythema or rash.  Neurological:     Mental Status: He is alert and oriented to person, place, and time.  Psychiatric:        Behavior: Behavior normal.        Thought Content: Thought content normal.        Judgment: Judgment normal.      Lab Results  Component Value Date   WBC 14.4 (H) 09/04/2018   HGB 12.1 (L) 09/04/2018   HCT 36.3 (L) 09/04/2018   MCV 82.7 09/04/2018   PLT 190 09/04/2018   Lab Results  Component Value Date   FERRITIN 78 07/10/2018   IRON 159 07/10/2018   TIBC 417 07/10/2018   UIBC 258 07/10/2018   IRONPCTSAT 38 07/10/2018   Lab Results  Component Value Date   RETICCTPCT 1.4 02/21/2011   RBC 4.39 09/04/2018   RETICCTABS 87.6 02/21/2011   No results found for: KPAFRELGTCHN, LAMBDASER, KAPLAMBRATIO No results found for: Kandis Cocking, IGMSERUM No results found for: Odetta Pink, SPEI   Chemistry      Component Value Date/Time   NA 138 09/04/2018 1441   NA 144 04/25/2017 1506   NA 139 04/12/2016 1113   K 3.9 09/04/2018 1441   K 4.2 04/25/2017 1506   K 4.1 04/12/2016 1113   CL 104 09/04/2018 1441   CL 105 04/25/2017 1506   CO2 25 09/04/2018 1441   CO2 29 04/25/2017 1506   CO2 22  04/12/2016 1113   BUN 16 09/04/2018 1441   BUN 17 04/25/2017 1506   BUN 16.3 04/12/2016 1113   CREATININE 0.93 09/04/2018 1441   CREATININE 1.2 04/25/2017 1506   CREATININE 0.9 04/12/2016 1113      Component Value Date/Time  CALCIUM 9.2 09/04/2018 1441   CALCIUM 9.5 04/25/2017 1506   CALCIUM 9.1 04/12/2016 1113   ALKPHOS 136 (H) 09/04/2018 1441   ALKPHOS 73 04/25/2017 1506   ALKPHOS 66 04/12/2016 1113   AST 34 09/04/2018 1441   AST 31 04/12/2016 1113   ALT 29 09/04/2018 1441   ALT 58 (H) 04/25/2017 1506   ALT 36 04/12/2016 1113   BILITOT 1.0 09/04/2018 1441   BILITOT 0.73 04/12/2016 1113       Impression and Plan: [name] [name] is a very pleasant 63 yo caucasian gentleman with polycythemia vera, JAK2 positive.   He does not need to be phlebotomized.  I am going to cut his anagrelide dose down to 1 mg a day.  This might help with the palpitations.  I would like to get a ultrasound of his abdomen.  I want to see if his spleen is getting larger.  If his spleen is getting larger, we may have to think about using Jakafi.  I know that a lot is going on right now.  We want to see him back in 1 month so we can maintain close follow-up. His Hct remains stable at 38.9% so no phlebotomy needed at this time.  He will continue his same regimen with Anagrelide.  We will plan to see him back in another 2 months.  He will contact our office with any questions or concerns. We can certainly see him sooner if need be.   [name], MD 4/30/20203:46 PM

## 2018-09-05 LAB — LACTATE DEHYDROGENASE: LDH: 704 U/L — ABNORMAL HIGH (ref 98–192)

## 2018-09-05 LAB — IRON AND TIBC
Iron: 94 ug/dL (ref 42–163)
Saturation Ratios: 25 % (ref 20–55)
TIBC: 381 ug/dL (ref 202–409)
UIBC: 288 ug/dL (ref 117–376)

## 2018-09-05 LAB — FERRITIN: Ferritin: 85 ng/mL (ref 24–336)

## 2018-09-11 ENCOUNTER — Ambulatory Visit (HOSPITAL_BASED_OUTPATIENT_CLINIC_OR_DEPARTMENT_OTHER)
Admission: RE | Admit: 2018-09-11 | Discharge: 2018-09-11 | Disposition: A | Discharge status: Home / Self Care | Payer: Managed Care, Other (non HMO) | Source: Ambulatory Visit | Attending: Hematology & Oncology | Admitting: Hematology & Oncology

## 2018-09-11 ENCOUNTER — Other Ambulatory Visit: Payer: Self-pay | Admitting: Hematology & Oncology

## 2018-09-11 ENCOUNTER — Other Ambulatory Visit: Payer: Self-pay

## 2018-09-11 DIAGNOSIS — D751 Secondary polycythemia: Secondary | ICD-10-CM | POA: Insufficient documentation

## 2018-09-11 DIAGNOSIS — I639 Cerebral infarction, unspecified: Secondary | ICD-10-CM | POA: Diagnosis not present

## 2018-09-12 ENCOUNTER — Emergency Department (HOSPITAL_BASED_OUTPATIENT_CLINIC_OR_DEPARTMENT_OTHER): Payer: Managed Care, Other (non HMO)

## 2018-09-12 ENCOUNTER — Inpatient Hospital Stay (HOSPITAL_BASED_OUTPATIENT_CLINIC_OR_DEPARTMENT_OTHER)
Admission: EM | Admit: 2018-09-12 | Discharge: 2018-09-15 | DRG: 066 | Disposition: A | Discharge status: Home / Self Care | Payer: Managed Care, Other (non HMO) | Attending: Internal Medicine | Admitting: Internal Medicine

## 2018-09-12 ENCOUNTER — Encounter (HOSPITAL_BASED_OUTPATIENT_CLINIC_OR_DEPARTMENT_OTHER): Payer: Self-pay

## 2018-09-12 ENCOUNTER — Other Ambulatory Visit: Payer: Self-pay

## 2018-09-12 ENCOUNTER — Other Ambulatory Visit (HOSPITAL_BASED_OUTPATIENT_CLINIC_OR_DEPARTMENT_OTHER): Payer: Managed Care, Other (non HMO)

## 2018-09-12 DIAGNOSIS — D751 Secondary polycythemia: Secondary | ICD-10-CM | POA: Diagnosis present

## 2018-09-12 DIAGNOSIS — Z8249 Family history of ischemic heart disease and other diseases of the circulatory system: Secondary | ICD-10-CM | POA: Diagnosis not present

## 2018-09-12 DIAGNOSIS — K5730 Diverticulosis of large intestine without perforation or abscess without bleeding: Secondary | ICD-10-CM | POA: Diagnosis not present

## 2018-09-12 DIAGNOSIS — E785 Hyperlipidemia, unspecified: Secondary | ICD-10-CM | POA: Diagnosis not present

## 2018-09-12 DIAGNOSIS — R29703 NIHSS score 3: Secondary | ICD-10-CM | POA: Diagnosis present

## 2018-09-12 DIAGNOSIS — Z8673 Personal history of transient ischemic attack (TIA), and cerebral infarction without residual deficits: Secondary | ICD-10-CM

## 2018-09-12 DIAGNOSIS — I480 Paroxysmal atrial fibrillation: Secondary | ICD-10-CM | POA: Diagnosis not present

## 2018-09-12 DIAGNOSIS — K5790 Diverticulosis of intestine, part unspecified, without perforation or abscess without bleeding: Secondary | ICD-10-CM | POA: Diagnosis not present

## 2018-09-12 DIAGNOSIS — R200 Anesthesia of skin: Secondary | ICD-10-CM

## 2018-09-12 DIAGNOSIS — Z7951 Long term (current) use of inhaled steroids: Secondary | ICD-10-CM | POA: Diagnosis not present

## 2018-09-12 DIAGNOSIS — Z87442 Personal history of urinary calculi: Secondary | ICD-10-CM

## 2018-09-12 DIAGNOSIS — D72829 Elevated white blood cell count, unspecified: Secondary | ICD-10-CM

## 2018-09-12 DIAGNOSIS — I639 Cerebral infarction, unspecified: Secondary | ICD-10-CM | POA: Diagnosis present

## 2018-09-12 DIAGNOSIS — Z20828 Contact with and (suspected) exposure to other viral communicable diseases: Secondary | ICD-10-CM | POA: Diagnosis present

## 2018-09-12 DIAGNOSIS — E663 Overweight: Secondary | ICD-10-CM | POA: Diagnosis not present

## 2018-09-12 DIAGNOSIS — Z6828 Body mass index (BMI) 28.0-28.9, adult: Secondary | ICD-10-CM | POA: Diagnosis not present

## 2018-09-12 DIAGNOSIS — Z7901 Long term (current) use of anticoagulants: Secondary | ICD-10-CM | POA: Diagnosis not present

## 2018-09-12 DIAGNOSIS — K589 Irritable bowel syndrome without diarrhea: Secondary | ICD-10-CM | POA: Diagnosis present

## 2018-09-12 DIAGNOSIS — D500 Iron deficiency anemia secondary to blood loss (chronic): Secondary | ICD-10-CM | POA: Diagnosis not present

## 2018-09-12 DIAGNOSIS — Z79899 Other long term (current) drug therapy: Secondary | ICD-10-CM | POA: Diagnosis not present

## 2018-09-12 DIAGNOSIS — R531 Weakness: Secondary | ICD-10-CM

## 2018-09-12 DIAGNOSIS — I10 Essential (primary) hypertension: Secondary | ICD-10-CM | POA: Diagnosis present

## 2018-09-12 DIAGNOSIS — I6389 Other cerebral infarction: Secondary | ICD-10-CM

## 2018-09-12 LAB — URINALYSIS, ROUTINE W REFLEX MICROSCOPIC
Bilirubin Urine: NEGATIVE
Glucose, UA: NEGATIVE mg/dL
Ketones, ur: NEGATIVE mg/dL
Leukocytes,Ua: NEGATIVE
Nitrite: NEGATIVE
Protein, ur: NEGATIVE mg/dL
Specific Gravity, Urine: 1.02 (ref 1.005–1.030)
pH: 7 (ref 5.0–8.0)

## 2018-09-12 LAB — COMPREHENSIVE METABOLIC PANEL
ALT: 29 U/L (ref 0–44)
AST: 31 U/L (ref 15–41)
Albumin: 4.3 g/dL (ref 3.5–5.0)
Alkaline Phosphatase: 117 U/L (ref 38–126)
Anion gap: 8 (ref 5–15)
BUN: 16 mg/dL (ref 8–23)
CO2: 25 mmol/L (ref 22–32)
Calcium: 8.8 mg/dL — ABNORMAL LOW (ref 8.9–10.3)
Chloride: 107 mmol/L (ref 98–111)
Creatinine, Ser: 0.96 mg/dL (ref 0.61–1.24)
GFR calc Af Amer: >60 mL/min (ref >60)
GFR calc non Af Amer: >60 mL/min (ref >60)
Glucose, Bld: 89 mg/dL (ref 70–99)
Potassium: 4 mmol/L (ref 3.5–5.1)
Sodium: 140 mmol/L (ref 135–145)
Total Bilirubin: 1.2 mg/dL (ref 0.3–1.2)
Total Protein: 6.4 g/dL — ABNORMAL LOW (ref 6.5–8.1)

## 2018-09-12 LAB — CBC
HCT: 37.3 % — ABNORMAL LOW (ref 39.0–52.0)
Hemoglobin: 12.2 g/dL — ABNORMAL LOW (ref 13.0–17.0)
MCH: 27.5 pg (ref 26.0–34.0)
MCHC: 32.7 g/dL (ref 30.0–36.0)
MCV: 84 fL (ref 80.0–100.0)
Platelets: 201 10*3/uL (ref 150–400)
RBC: 4.44 MIL/uL (ref 4.22–5.81)
RDW: 20 % — ABNORMAL HIGH (ref 11.5–15.5)
WBC: 13.1 10*3/uL — ABNORMAL HIGH (ref 4.0–10.5)
nRBC: 2 % — ABNORMAL HIGH (ref 0.0–0.2)

## 2018-09-12 LAB — DIFFERENTIAL
Abs Immature Granulocytes: 1.02 10*3/uL — ABNORMAL HIGH (ref 0.00–0.07)
Basophils Absolute: 0.3 10*3/uL — ABNORMAL HIGH (ref 0.0–0.1)
Basophils Relative: 2 %
Eosinophils Absolute: 0.5 10*3/uL (ref 0.0–0.5)
Eosinophils Relative: 4 %
Immature Granulocytes: 8 %
Lymphocytes Relative: 10 %
Lymphs Abs: 1.3 10*3/uL (ref 0.7–4.0)
Monocytes Absolute: 1.1 10*3/uL — ABNORMAL HIGH (ref 0.1–1.0)
Monocytes Relative: 9 %
Neutro Abs: 9.1 10*3/uL — ABNORMAL HIGH (ref 1.7–7.7)
Neutrophils Relative %: 67 %

## 2018-09-12 LAB — URINALYSIS, MICROSCOPIC (REFLEX)

## 2018-09-12 LAB — APTT: aPTT: 40 seconds — ABNORMAL HIGH (ref 24–36)

## 2018-09-12 LAB — PROTIME-INR
INR: 1.3 — ABNORMAL HIGH (ref 0.8–1.2)
Prothrombin Time: 15.8 seconds — ABNORMAL HIGH (ref 11.4–15.2)

## 2018-09-12 LAB — ETHANOL: Alcohol, Ethyl (B): <10 mg/dL (ref <10)

## 2018-09-12 LAB — RAPID URINE DRUG SCREEN, HOSP PERFORMED
Amphetamines: NOT DETECTED
Barbiturates: NOT DETECTED
Benzodiazepines: NOT DETECTED
Cocaine: NOT DETECTED
Opiates: NOT DETECTED
Tetrahydrocannabinol: NOT DETECTED

## 2018-09-12 LAB — SARS CORONAVIRUS 2 AG (30 MIN TAT): SARS Coronavirus 2 Ag: NEGATIVE

## 2018-09-12 NOTE — ED Provider Notes (Addendum)
11:15 AM  Patient transferred from [facility] for MRI brain.  Patient reports 5 days ago he noticed that his right leg from the knee down felt "rubbery".  States that today he had the same symptoms from the elbow down in the right arm and had some numbness in the right fingertips that then extended into the entire hand.  Also felt like he had some numbness in the right side of his face.  Symptoms have improved but still having very minimal numbness in the right fingers.  No significant headache, neck or back pain.  No weakness currently.  No history of hypertension, diabetes, hyperlipidemia, tobacco use.  Patient has history of paroxysmal atrial fibrillation and is on Eliquis.  Patient has polycythemia vera.  MRI brain without contrast ordered.  Exam currently normal other than some mild numbness in his third, fourth and fifth right fingertips.  He has equal pulses in all extremities.  Normal strength.  Cranial nerves II to XII intact.  Normal speech.  Given symptoms intermittent for the past 5 days and very mild at baseline currently with only an NIH stroke scale of 1, patient is not a TPA candidate.  1:50 AM  Pt appears to have had acute/subacute infarct on MRI.  Discussed with Dr. Rory Percy with neurology who will see patient in consult and request medicine admission.  Patient has been updated.  2:22 AM Discussed patient's case with hospitalist, Dr. Marlowe Sax.  I have recommended admission and patient (and family if present) agree with this plan. Admitting physician will place admission orders.   I reviewed all nursing notes, vitals, pertinent previous records, EKGs, lab and urine results, imaging (as available).     Gwendalyn Mcgonagle, Delice Bison, DO 09/13/18 0222    Jolita Haefner, Delice Bison, DO 09/13/18 Rogene [place]

## 2018-09-12 NOTE — ED Notes (Signed)
Pt denies any current numbness in right side.

## 2018-09-12 NOTE — ED Provider Notes (Signed)
[facility] Provider Note   CSN: [id number] Arrival date & time: 09/12/18  1558    History   Chief Complaint Chief Complaint  Patient presents with  . Numbness    HPI [name] [name] is a 63 y.o. male with past medical history of hypertension, polycythemia, hypertension, diverticulosis, splenomegaly, who presents today for evaluation of multiple episodes of weakness and feeling off balance.  His first episode was on Sunday night when he reports that his right leg from the knee down felt weak.  He was able to walk on it however it simply felt weak.  He denies any numbness or tingling or other symptoms at this time.  He went to bed approximately 20 minutes after he noted it and when he woke up the next morning it was gone.  Today when he got up he felt like his right arm was weak.  He was unable to hold his phone due to weakness.  He reports that the ulnar aspect of his hand started getting numb with decreased sensation that progressed to his entire hand.  This lasted approximately 30 minutes.  At work today he felt "wobbly."  He says that it felt like he had been drinking balance and coordination wise however he denies any alcohol or drug use.  He reports that when he walks he feels like he is not stable, however he did not lose his balance.  This started mostly at 11 AM, lasted about 1 hour.    He does not have a history of migraines.  He reports that he has had a left frontal headache since he woke up this morning, 2-3 out of 10.  He reports compliance with all of his medications including his Eliquis.  He denies striking his head or passing out.  He reports that he usually gets feeling like he has a tight neck muscles during the week which he relates to work.  What he is having today is consistent with his usual neck tightness and not any different or concerning to him.  He denies any palpitations during these episodes.  No known coronavirus contacts.  No cough,  chest pain, nausea vomiting or diarrhea.  No abdominal pain.    HPI  Past Medical History:  Diagnosis Date  . Diverticulosis of colon (without mention of hemorrhage)   . Hemorrhoids   . Hypertension   . IBS (irritable bowel syndrome)   . Iron deficiency anemia due to chronic blood loss 09/26/2017  . Irregular heartbeat   . Nephrolithiasis   . Other and unspecified hyperlipidemia   . Perirectal fistula   . Polycythemia, secondary   . Stye    blocker duct right eye lid    Patient Active Problem List   Diagnosis Date Noted  . Iron deficiency anemia due to chronic blood loss 09/26/2017  . PALPITATIONS 09/14/2009  . CHEST PAIN 09/09/2009  . Constipation 06/08/2008  . RECTAL BLEEDING 06/08/2008  . DYSLIPIDEMIA 06/03/2008  . POLYCYTHEMIA 06/03/2008  . DIVERTICULOSIS OF COLON 06/03/2008    Past Surgical History:  Procedure Laterality Date  . RETINAL DETACHMENT SURGERY    . ROTATOR CUFF REPAIR     rt.  Marland Kitchen VASECTOMY          Home Medications    Prior to Admission medications   Medication Sig Start Date End Date Taking? Authorizing Provider  acetaminophen (TYLENOL) 500 MG tablet Take 1,000 mg by mouth every 6 (six) hours as needed. For pain  [provider]  anagrelide (AGRYLIN) 1 MG capsule TAKE 1 CAPSULE (1 MG TOTAL) BY MOUTH 2 (TWO) TIMES DAILY. 09/11/18   [name], MD  apixaban (ELIQUIS) 5 MG TABS tablet Take 1 tablet (5 mg total) by mouth 2 (two) times daily. 07/23/18   Lelon Perla, MD  buPROPion (WELLBUTRIN SR) 150 MG 12 hr tablet Take 150 mg by mouth every morning. 07/31/15   [provider]  fluorouracil (EFUDEX) 5 % cream  12/26/16   [provider]  fluticasone (FLONASE) 50 MCG/ACT nasal spray Place into both nostrils daily.    [provider]  propranolol ER (INDERAL LA) 160 MG SR capsule Take 1 capsule (160 mg total) by mouth daily. 07/23/18   Lelon Perla, MD  zolpidem (AMBIEN CR) 12.5 MG CR tablet Take by  mouth as needed. For sleep pt usually takes 1/2 tablet    [provider]    Family History Family History  Problem Relation Age of Onset  . Aortic aneurysm Father   . Heart attack Brother   . Colon cancer Neg Hx   . Esophageal cancer Neg Hx   . Pancreatic cancer Neg Hx   . Prostate cancer Neg Hx   . Rectal cancer Neg Hx   . Stomach cancer Neg Hx     Social History Social History   Tobacco Use  . Smoking status: Never Smoker  . Smokeless tobacco: Never Used  Substance Use Topics  . Alcohol use: Yes    Alcohol/week: 0.0 standard drinks    Comment: Occasional  . Drug use: No     Allergies   Patient has no known allergies.   Review of Systems Review of Systems  Constitutional: Negative for chills and fever.  HENT: Negative for congestion, hearing loss, rhinorrhea and sinus pain.   Eyes: Negative for pain and visual disturbance.  Respiratory: Negative for cough, chest tightness and shortness of breath.   Cardiovascular: Negative for chest pain.  Gastrointestinal: Negative for abdominal pain, constipation, diarrhea and vomiting.  Genitourinary: Negative for dysuria.  Musculoskeletal: Positive for neck pain. Negative for back pain.  Skin: Negative for wound.  Neurological: Positive for dizziness, weakness, numbness and headaches. Negative for facial asymmetry, speech difficulty and light-headedness.  All other systems reviewed and are negative.    Physical Exam Updated Vital Signs BP (!) 161/81   Pulse (!) 58   Temp 98.5 F (36.9 C) (Oral)   Resp 18   Ht 5\' 11"  (1.803 m)   Wt 91.2 kg   SpO2 97%   BMI 28.03 kg/m   Physical Exam Vitals signs and nursing note reviewed.  Constitutional:      General: He is not in acute distress.    Appearance: He is well-developed.  HENT:     Head: Normocephalic and atraumatic.  Eyes:     Conjunctiva/sclera: Conjunctivae normal.  Neck:     Musculoskeletal: Normal range of motion and neck supple. No neck  rigidity.  Cardiovascular:     Rate and Rhythm: Normal rate and regular rhythm.     Pulses: Normal pulses.     Heart sounds: Normal heart sounds. No murmur.     Comments: 2+ DP/PT/radial pulses bilaterally. Pulmonary:     Effort: Pulmonary effort is normal. No respiratory distress.     Breath sounds: Normal breath sounds.  Abdominal:     Palpations: Abdomen is soft.     Tenderness: There is no abdominal tenderness.  Musculoskeletal: Normal range of  motion.     Right lower leg: No edema.     Left lower leg: No edema.  Skin:    General: Skin is warm and dry.  Neurological:     Mental Status: He is alert.     Sensory: No sensory deficit (Sensation to light touch intact and symmetrical to bilateral extremities.).     Comments: Mental Status:  Alert, oriented, thought content appropriate, able to give a coherent history. Speech fluent without evidence of aphasia. Able to follow 2 step commands without difficulty.  Cranial Nerves:  II:  Peripheral visual fields grossly normal, pupils equal, round, reactive to light III,IV, VI: ptosis not present, extra-ocular motions intact bilaterally  V,VII: smile symmetric, facial light touch sensation equal VIII: hearing grossly normal to voice  X: uvula elevates symmetrically  XI: bilateral shoulder shrug symmetric and strong XII: midline tongue extension without fassiculations Motor:  Normal tone. 5/5 in upper and lower extremities bilaterally including strong and equal grip strength and dorsiflexion/plantar flexion Cerebellar: normal finger-to-nose with bilateral upper extremities, normal heal shin of bilateral lower extremities.  Gait: normal gait and balance CV: distal pulses palpable throughout    Psychiatric:        Mood and Affect: Mood normal.        Behavior: Behavior normal.      ED Treatments / Results  Labs (all labs ordered are listed, but only abnormal results are displayed) Labs Reviewed  PROTIME-INR - Abnormal; Notable  for the following components:      Result Value   Prothrombin Time 15.8 (*)    INR 1.3 (*)    All other components within normal limits  APTT - Abnormal; Notable for the following components:   aPTT 40 (*)    All other components within normal limits  CBC - Abnormal; Notable for the following components:   WBC 13.1 (*)    Hemoglobin 12.2 (*)    HCT 37.3 (*)    RDW 20.0 (*)    nRBC 2.0 (*)    All other components within normal limits  DIFFERENTIAL - Abnormal; Notable for the following components:   Neutro Abs 9.1 (*)    Monocytes Absolute 1.1 (*)    Basophils Absolute 0.3 (*)    Abs Immature Granulocytes 1.02 (*)    All other components within normal limits  URINALYSIS, ROUTINE W REFLEX MICROSCOPIC - Abnormal; Notable for the following components:   Hgb urine dipstick TRACE (*)    All other components within normal limits  URINALYSIS, MICROSCOPIC (REFLEX) - Abnormal; Notable for the following components:   Bacteria, UA FEW (*)    All other components within normal limits  COMPREHENSIVE METABOLIC PANEL - Abnormal; Notable for the following components:   Calcium 8.8 (*)    Total Protein 6.4 (*)    All other components within normal limits  SARS CORONAVIRUS 2 (HOSP ORDER, PERFORMED IN [facility] VIA ABBOTT ID)  ETHANOL  RAPID URINE DRUG SCREEN, HOSP PERFORMED    EKG EKG Interpretation  Date/Time:  Friday Sep 12 2018 16:19:52 EDT Ventricular Rate:  58 PR Interval:    QRS Duration: 113 QT Interval:  436 QTC Calculation: 429 R Axis:   -42 Text Interpretation:  Sinus rhythm Atrial premature complex Borderline IVCD with LAD Abnormal R-wave progression, early transition no sig change from previous Confirmed by Charlesetta Shanks [phone]) on 09/12/2018 4:26:46 PM   Radiology-ultrasound obtained as outpatient yesterday, unrelated to this ED visit. Dg Chest 2 View  Result Date:  09/12/2018 CLINICAL DATA:  Numbness EXAM: CHEST - 2 VIEW COMPARISON:  05/14/2014 FINDINGS: The heart  size and mediastinal contours are within normal limits. Both lungs are clear. The visualized skeletal structures are unremarkable. Postsurgical changes in the right shoulder are again seen. IMPRESSION: No active cardiopulmonary disease. Electronically Signed   By: Inez Catalina M.D.   On: 09/12/2018 17:47   Ct Head Wo Contrast  Result Date: 09/12/2018 CLINICAL DATA:  Right leg tingling for several days, headaches, initial encounter EXAM: CT HEAD WITHOUT CONTRAST TECHNIQUE: Contiguous axial images were obtained from the base of the skull through the vertex without intravenous contrast. COMPARISON:  None. FINDINGS: Brain: No evidence of acute infarction, hemorrhage, hydrocephalus, extra-axial collection or mass lesion/mass effect. Mild atrophic changes are noted. Vascular: No hyperdense vessel or unexpected calcification. Skull: Normal. Negative for fracture or focal lesion. Sinuses/Orbits: No acute finding. Other: None. IMPRESSION: Mild atrophic changes without acute abnormality. Electronically Signed   By: Inez Catalina M.D.   On: 09/12/2018 17:45   US Abdomen Complete  Result Date: 09/11/2018 CLINICAL DATA:  Polycythemia, assess for splenomegaly EXAM: ABDOMEN ULTRASOUND COMPLETE COMPARISON:  CT abdomen/pelvis dated 08/27/2018. FINDINGS: Gallbladder: Adherent gallstones vs gallbladder polyps measuring up to 5 mm. No sonographic Murphy sign noted by sonographer. Common bile duct: Diameter: 4 mm Liver: Within the upper limits of normal for parenchymal echogenicity. No focal hepatic lesion is seen. Portal vein is patent on color Doppler imaging with normal direction of blood flow towards the liver. IVC: No abnormality visualized. Pancreas: Visualized portion unremarkable. Spleen: Enlarged, measuring 22.2 x 8.0 x 22.0 cm (calculated volume 2037 mL). Right Kidney: Length: 10.7 mL. Echogenicity within normal limits. No mass or hydronephrosis visualized. Left Kidney: Length: 11.7 mL. Echogenicity within normal limits.  1.9 x 1.7 x 2.0 cm mildly irregular cysts without solid component. No hydronephrosis. Abdominal aorta: No aneurysm visualized. Other findings: None. IMPRESSION: Splenomegaly, measuring 22.2 cm (calculated volume 2037 mL), as above. Electronically Signed   By: Julian Hy M.D.   On: 09/11/2018 10:37    Procedures Procedures (including critical care time)  Medications Ordered in ED Medications - No data to display   Initial Impression / Assessment and Plan / ED Course  I have reviewed the triage vital signs and the nursing notes.  Pertinent labs & imaging results that were available during my care of the patient were reviewed by me and considered in my medical decision making (see chart for details).  Clinical Course as of Sep 12 25  Fri Sep 12, 2018  1959 Spoke with Dr. Malen Gauze from neurology, he is in between code strokes and will call me back.   [EH]  2013 MRI no contrast, just head   [EH]  2056 Attempted to call ED for transfer, no answer.    [EH]  2102 I spoke with Dr. Tyrone Nine at [facility] ED who accepts patient in transfer.   [EH]    Clinical Course User Index [EH] Lorin Glass, PA-C      Patient presents today for evaluation of multiple episodes of weakness.  He initially had an episode of weakness of his right leg over the weekend which he went to bed with and had resolved by morning.  Today he has had 2 episodes where his right arm has felt too weak and he has been unable to pick up or hold his phone during this time.  He has a slight headache, in addition to intermittently feeling off balance and unsteady.  He has  not fallen.  He reports compliance with his Eliquis.  Labs were obtained and reviewed, his CBC and CMP appear consistent with his baseline.  Chest x-ray was obtained without evidence of acute abnormality.  CT head without contrast was obtained which did not show evidence of intracranial hemorrhage or other significant acute abnormalities.  Ethanol is  undetectable.  Urine microscopic exam shows 0-5 squamous epithelial cells, few bacteria, 0-5 white cells and 6-10 red cells.  He is not having any urinary symptoms therefore I suspect that this is not responsible for his symptoms.  Screening coronavirus test was obtained which was negative.  I spoke with on-call neurology Dr. Malen Gauze who recommended transfer to [facility] to obtain MRI.  He says that if MRI is normal most likely patient can go home.  I spoke with Dr. Tyrone Nine at [facility] ED who accepts the patient in transfer.  Patient transferred to [facility].  He remained hemodynamically stable while in my care without repeat episodes of weakness.   Final Clinical Impressions(s) / ED Diagnoses   Final diagnoses:  Weakness    ED Discharge Orders    None       Ollen Gross 09/13/18 Sharl Ma    Charlesetta Shanks, MD 09/14/18 2014

## 2018-09-12 NOTE — ED Notes (Signed)
Updated pt to delay, gave him some ginger ale and he is watching tv.

## 2018-09-12 NOTE — ED Notes (Signed)
Pt ambulatory to the restroom without assistance.  

## 2018-09-12 NOTE — ED Triage Notes (Addendum)
Pt c/o right LE "felt like rubber" on 5/3-lasted x 10 min-today ~ 630am pt with right UE numbness, weakness-"felt out of sorts-like vertigo"-pt NAD-steady gait

## 2018-09-13 ENCOUNTER — Observation Stay (HOSPITAL_COMMUNITY): Payer: Managed Care, Other (non HMO)

## 2018-09-13 ENCOUNTER — Emergency Department (HOSPITAL_COMMUNITY): Payer: Managed Care, Other (non HMO)

## 2018-09-13 ENCOUNTER — Observation Stay (HOSPITAL_BASED_OUTPATIENT_CLINIC_OR_DEPARTMENT_OTHER): Payer: Managed Care, Other (non HMO)

## 2018-09-13 DIAGNOSIS — Z7901 Long term (current) use of anticoagulants: Secondary | ICD-10-CM | POA: Diagnosis not present

## 2018-09-13 DIAGNOSIS — Z79899 Other long term (current) drug therapy: Secondary | ICD-10-CM | POA: Diagnosis not present

## 2018-09-13 DIAGNOSIS — D72829 Elevated white blood cell count, unspecified: Secondary | ICD-10-CM | POA: Diagnosis present

## 2018-09-13 DIAGNOSIS — Z8673 Personal history of transient ischemic attack (TIA), and cerebral infarction without residual deficits: Secondary | ICD-10-CM | POA: Diagnosis not present

## 2018-09-13 DIAGNOSIS — I361 Nonrheumatic tricuspid (valve) insufficiency: Secondary | ICD-10-CM

## 2018-09-13 DIAGNOSIS — I480 Paroxysmal atrial fibrillation: Secondary | ICD-10-CM

## 2018-09-13 DIAGNOSIS — K5790 Diverticulosis of intestine, part unspecified, without perforation or abscess without bleeding: Secondary | ICD-10-CM | POA: Diagnosis present

## 2018-09-13 DIAGNOSIS — D751 Secondary polycythemia: Secondary | ICD-10-CM | POA: Diagnosis present

## 2018-09-13 DIAGNOSIS — R531 Weakness: Secondary | ICD-10-CM | POA: Diagnosis not present

## 2018-09-13 DIAGNOSIS — Z6828 Body mass index (BMI) 28.0-28.9, adult: Secondary | ICD-10-CM | POA: Diagnosis not present

## 2018-09-13 DIAGNOSIS — D500 Iron deficiency anemia secondary to blood loss (chronic): Secondary | ICD-10-CM | POA: Diagnosis present

## 2018-09-13 DIAGNOSIS — Z87442 Personal history of urinary calculi: Secondary | ICD-10-CM | POA: Diagnosis not present

## 2018-09-13 DIAGNOSIS — E785 Hyperlipidemia, unspecified: Secondary | ICD-10-CM | POA: Diagnosis present

## 2018-09-13 DIAGNOSIS — I6389 Other cerebral infarction: Secondary | ICD-10-CM | POA: Diagnosis not present

## 2018-09-13 DIAGNOSIS — R29703 NIHSS score 3: Secondary | ICD-10-CM | POA: Diagnosis present

## 2018-09-13 DIAGNOSIS — Z20828 Contact with and (suspected) exposure to other viral communicable diseases: Secondary | ICD-10-CM | POA: Diagnosis present

## 2018-09-13 DIAGNOSIS — Z7951 Long term (current) use of inhaled steroids: Secondary | ICD-10-CM | POA: Diagnosis not present

## 2018-09-13 DIAGNOSIS — I10 Essential (primary) hypertension: Secondary | ICD-10-CM | POA: Diagnosis present

## 2018-09-13 DIAGNOSIS — I639 Cerebral infarction, unspecified: Secondary | ICD-10-CM | POA: Diagnosis present

## 2018-09-13 DIAGNOSIS — K5730 Diverticulosis of large intestine without perforation or abscess without bleeding: Secondary | ICD-10-CM | POA: Diagnosis present

## 2018-09-13 DIAGNOSIS — Z8249 Family history of ischemic heart disease and other diseases of the circulatory system: Secondary | ICD-10-CM | POA: Diagnosis not present

## 2018-09-13 DIAGNOSIS — E663 Overweight: Secondary | ICD-10-CM | POA: Diagnosis present

## 2018-09-13 DIAGNOSIS — K589 Irritable bowel syndrome without diarrhea: Secondary | ICD-10-CM | POA: Diagnosis present

## 2018-09-13 LAB — HEMOGLOBIN A1C
Hgb A1c MFr Bld: 4.8 % (ref 4.8–5.6)
Mean Plasma Glucose: 91.06 mg/dL

## 2018-09-13 LAB — LIPID PANEL
Cholesterol: 142 mg/dL (ref 0–200)
HDL: 25 mg/dL — ABNORMAL LOW (ref >40)
LDL Cholesterol: 76 mg/dL (ref 0–99)
Total CHOL/HDL Ratio: 5.7 RATIO
Triglycerides: 203 mg/dL — ABNORMAL HIGH (ref <150)
VLDL: 41 mg/dL — ABNORMAL HIGH (ref 0–40)

## 2018-09-13 LAB — CBC WITH DIFFERENTIAL/PLATELET
Abs Immature Granulocytes: 0 10*3/uL (ref 0.00–0.07)
Band Neutrophils: 3 %
Basophils Absolute: 0.1 10*3/uL (ref 0.0–0.1)
Basophils Relative: 1 %
Eosinophils Absolute: 0.3 10*3/uL (ref 0.0–0.5)
Eosinophils Relative: 2 %
HCT: 36.8 % — ABNORMAL LOW (ref 39.0–52.0)
Hemoglobin: 12.1 g/dL — ABNORMAL LOW (ref 13.0–17.0)
Lymphocytes Relative: 9 %
Lymphs Abs: 1.3 10*3/uL (ref 0.7–4.0)
MCH: 26.9 pg (ref 26.0–34.0)
MCHC: 32.9 g/dL (ref 30.0–36.0)
MCV: 82 fL (ref 80.0–100.0)
Monocytes Absolute: 0.1 10*3/uL (ref 0.1–1.0)
Monocytes Relative: 1 %
Neutro Abs: 12.4 10*3/uL — ABNORMAL HIGH (ref 1.7–7.7)
Neutrophils Relative %: 84 %
Platelets: 261 10*3/uL (ref 150–400)
RBC: 4.49 MIL/uL (ref 4.22–5.81)
RDW: 19.7 % — ABNORMAL HIGH (ref 11.5–15.5)
WBC: 14.2 10*3/uL — ABNORMAL HIGH (ref 4.0–10.5)
nRBC: 1.4 % — ABNORMAL HIGH (ref 0.0–0.2)

## 2018-09-13 LAB — ECHOCARDIOGRAM COMPLETE
Height: 71 in
Weight: 3216 oz

## 2018-09-13 LAB — HIV ANTIBODY (ROUTINE TESTING W REFLEX): HIV Screen 4th Generation wRfx: NONREACTIVE

## 2018-09-13 LAB — PROCALCITONIN: Procalcitonin: 0.1 ng/mL

## 2018-09-13 MED ORDER — ACETAMINOPHEN 650 MG RE SUPP: 650.0000 mg | RECTAL | Status: DC | PRN

## 2018-09-13 MED ORDER — ACETAMINOPHEN 160 MG/5ML PO SOLN: 650.0000 mg | ORAL | Status: DC | PRN

## 2018-09-13 MED ORDER — ATORVASTATIN CALCIUM 40 MG PO TABS
40.0000 mg | ORAL_TABLET | Freq: Every day | ORAL | Status: DC
  Administered 2018-09-13 – 2018-09-14 (×2): 40 mg via ORAL
  Filled 2018-09-13 (×2): qty 1

## 2018-09-13 MED ORDER — APIXABAN 5 MG PO TABS
5.0000 mg | ORAL_TABLET | Freq: Two times a day (BID) | ORAL | Status: DC
  Administered 2018-09-13 – 2018-09-15 (×5): 5 mg via ORAL
  Filled 2018-09-13 (×5): qty 1

## 2018-09-13 MED ORDER — LORAZEPAM 2 MG/ML IJ SOLN
1.0000 mg | Freq: Once | INTRAMUSCULAR | Status: AC
  Administered 2018-09-13: 1 mg via INTRAVENOUS
  Filled 2018-09-13: qty 1

## 2018-09-13 MED ORDER — IOHEXOL 350 MG/ML SOLN
100.0000 mL | Freq: Once | INTRAVENOUS | Status: AC | PRN
  Administered 2018-09-13: 100 mL via INTRAVENOUS

## 2018-09-13 MED ORDER — ACETAMINOPHEN 325 MG PO TABS
650.0000 mg | ORAL_TABLET | ORAL | Status: DC | PRN
  Administered 2018-09-13: 650 mg via ORAL
  Filled 2018-09-13: qty 2

## 2018-09-13 MED ORDER — STROKE: EARLY STAGES OF RECOVERY BOOK
Freq: Once | Status: AC
  Administered 2018-09-13: 06:00:00
  Filled 2018-09-13: qty 1

## 2018-09-13 MED ORDER — LORAZEPAM BOLUS VIA INFUSION: 1.0000 mg | Freq: Once | INTRAVENOUS | Status: DC

## 2018-09-13 NOTE — H&P (Signed)
History and Physical    [name] [name] LOV:[id number] DOB: 01/05/1956 DOA: 09/12/2018  PCP: Leanna Battles, MD Patient coming from: Home  Chief Complaint: Weakness  HPI: [name] [name] is a 63 y.o. male with medical history significant of hypertension, paroxysmal A. fib on Eliquis, polycythemia, and conditions listed below presenting to the hospital for evaluation of weakness.  Patient states he had weakness in his right leg a week ago.  He describes it as "a rubbery feeling."  States this started in the evening and when he woke up the next in the morning symptoms had resolved.  Then yesterday he experienced weakness in his right arm which lasted a few hours and he also had numbness and tingling in his right hand and fingers which lasted 5 minutes.  At present, he is not having any weakness, numbness, or tingling.  No changes in his vision.  Denies history of prior stroke.  He does not smoke cigarettes.  ED Course: Blood pressure 152/90, remainder of vitals stable on arrival.  White count 13.1.  Hemoglobin 12.2, no significant change since recent labs.  Blood ethanol level less than 10.  UDS negative.  INR 1.3.  UA not suggestive of infection.  COVID-19 rapid test negative.  Chest x-ray showing no active cardiopulmonary disease.  Head CT negative for acute finding.  Brain MRI showing subcentimeter acute/early subacute infarction within the cortex of the left superior precentral gyrus.  No associated hemorrhage or mass-effect.  Review of Systems: As per HPI otherwise 10 point review of systems negative.  Past Medical History:  Diagnosis Date   Diverticulosis of colon (without mention of hemorrhage)    Hemorrhoids    Hypertension    IBS (irritable bowel syndrome)    Iron deficiency anemia due to chronic blood loss 09/26/2017   Irregular heartbeat    Nephrolithiasis    Other and unspecified hyperlipidemia    Perirectal fistula    Polycythemia, secondary    Stye    blocker duct right  eye lid    Past Surgical History:  Procedure Laterality Date   RETINAL DETACHMENT SURGERY     ROTATOR CUFF REPAIR     rt.   VASECTOMY       reports that he has never smoked. He has never used smokeless tobacco. He reports current alcohol use. He reports that he does not use drugs.  No Known Allergies  Family History  Problem Relation Age of Onset   Aortic aneurysm Father    Heart attack Brother    Colon cancer Neg Hx    Esophageal cancer Neg Hx    Pancreatic cancer Neg Hx    Prostate cancer Neg Hx    Rectal cancer Neg Hx    Stomach cancer Neg Hx     Prior to Admission medications   Medication Sig Start Date End Date Taking? Authorizing Provider  acetaminophen (TYLENOL) 500 MG tablet Take 1,000 mg by mouth every 6 (six) hours as needed for mild pain.    Yes [provider]  anagrelide (AGRYLIN) 1 MG capsule TAKE 1 CAPSULE (1 MG TOTAL) BY MOUTH 2 (TWO) TIMES DAILY. 09/11/18  Yes Ennever, Rudell Cobb, MD  apixaban (ELIQUIS) 5 MG TABS tablet Take 1 tablet (5 mg total) by mouth 2 (two) times daily. 07/23/18  Yes Lelon Perla, MD  buPROPion [facility] SR) 150 MG 12 hr tablet Take 150 mg by mouth every morning. 07/31/15  Yes [provider]  fluticasone (FLONASE) 50 MCG/ACT nasal spray Place 1 spray  into both nostrils daily as needed for allergies.    Yes [provider]  ibuprofen (ADVIL) 200 MG tablet Take 400 mg by mouth every 6 (six) hours as needed for moderate pain.   Yes [provider]  propranolol ER (INDERAL LA) 160 MG SR capsule Take 1 capsule (160 mg total) by mouth daily. 07/23/18  Yes Lelon Perla, MD  zolpidem (AMBIEN CR) 12.5 MG CR tablet Take 6.25 mg by mouth at bedtime.    Yes [provider]    Physical Exam: Vitals:   09/12/18 2035 09/12/18 2345 09/13/18 0500 09/13/18 0741  BP: (!) 148/81 (!) 161/81 (!) 148/82 (!) 152/79  Pulse: 67 (!) 58 67 (!) 58  Resp: 19 18 (!) 26 16  Temp:   98 F (36.7 C) 98.1  F (36.7 C)  TempSrc:   Oral Oral  SpO2: 99% 97% 100% 99%  Weight:      Height:        Physical Exam  Constitutional: He is oriented to person, place, and time. He appears well-developed and well-nourished. No distress.  HENT:  Head: Normocephalic.  Mouth/Throat: Oropharynx is clear and moist.  Eyes: Pupils are equal, round, and reactive to light. EOM are normal.  Neck: Neck supple.  Cardiovascular: Normal rate, regular rhythm and intact distal pulses.  Pulmonary/Chest: Effort normal and breath sounds normal. No respiratory distress. He has no wheezes. He has no rales.  Abdominal: Soft. Bowel sounds are normal. He exhibits no distension. There is no abdominal tenderness. There is no guarding.  Musculoskeletal:        General: No edema.  Neurological: He is alert and oriented to person, place, and time. No cranial nerve deficit.  Speech fluent, tongue midline, no facial droop. Strength 5 out of 5 in bilateral upper and lower extremities. Sensation to light touch intact throughout.  Skin: Skin is warm and dry. He is not diaphoretic.  Psychiatric: He has a normal mood and affect. His behavior is normal.     Labs on Admission: I have personally reviewed following labs and imaging studies  CBC: Recent Labs  Lab 09/12/18 1714  WBC 13.1*  NEUTROABS 9.1*  HGB 12.2*  HCT 37.3*  MCV 84.0  PLT 297   Basic Metabolic Panel: Recent Labs  Lab 09/12/18 1800  NA 140  K 4.0  CL 107  CO2 25  GLUCOSE 89  BUN 16  CREATININE 0.96  CALCIUM 8.8*   GFR: Estimated Creatinine Clearance: 93.4 mL/min (by C-G formula based on SCr of 0.96 mg/dL). Liver Function Tests: Recent Labs  Lab 09/12/18 1800  AST 31  ALT 29  ALKPHOS 117  BILITOT 1.2  PROT 6.4*  ALBUMIN 4.3   No results for input(s): LIPASE, AMYLASE in the last 168 hours. No results for input(s): AMMONIA in the last 168 hours. Coagulation Profile: Recent Labs  Lab 09/12/18 1714  INR 1.3*   Cardiac Enzymes: No  results for input(s): CKTOTAL, CKMB, CKMBINDEX, TROPONINI in the last 168 hours. BNP (last 3 results) No results for input(s): PROBNP in the last 8760 hours. HbA1C: Recent Labs    09/13/18 0634  HGBA1C 4.8   CBG: No results for input(s): GLUCAP in the last 168 hours. Lipid Profile: Recent Labs    09/13/18 0634  CHOL 142  HDL 25*  LDLCALC 76  TRIG 203*  CHOLHDL 5.7   Thyroid Function Tests: No results for input(s): TSH, T4TOTAL, FREET4, T3FREE, THYROIDAB in the last 72 hours. Anemia Panel: No  results for input(s): VITAMINB12, FOLATE, FERRITIN, TIBC, IRON, RETICCTPCT in the last 72 hours. Urine analysis:    Component Value Date/Time   COLORURINE YELLOW 09/12/2018 1723   APPEARANCEUR CLEAR 09/12/2018 1723   LABSPEC 1.020 09/12/2018 1723   LABSPEC 1.015 04/09/2008 0801   PHURINE 7.0 09/12/2018 1723   GLUCOSEU NEGATIVE 09/12/2018 1723   HGBUR TRACE (A) 09/12/2018 1723   BILIRUBINUR NEGATIVE 09/12/2018 1723   KETONESUR NEGATIVE 09/12/2018 1723   PROTEINUR NEGATIVE 09/12/2018 1723   NITRITE NEGATIVE 09/12/2018 1723   LEUKOCYTESUR NEGATIVE 09/12/2018 1723    Radiological Exams on Admission: Ct Angio Head W Or Wo Contrast  Result Date: 09/13/2018 CLINICAL DATA:  63 y/o  M; stroke for follow-up. EXAM: CT ANGIOGRAPHY HEAD AND NECK TECHNIQUE: Multidetector CT imaging of the head and neck was performed using the standard protocol during bolus administration of intravenous contrast. Multiplanar CT image reconstructions and MIPs were obtained to evaluate the vascular anatomy. Carotid stenosis measurements (when applicable) are obtained utilizing NASCET criteria, using the distal internal carotid diameter as the denominator. CONTRAST:  18mL OMNIPAQUE IOHEXOL 350 MG/ML SOLN COMPARISON:  09/12/2018 CT head.  09/13/2018 MRI head. FINDINGS: CTA NECK FINDINGS Aortic arch: Standard branching. Imaged portion shows no evidence of aneurysm or dissection. No significant stenosis of the major arch  vessel origins. Right carotid system: No evidence of dissection, stenosis (50% or greater) or occlusion. Mild ectasia of the mid cervical ICA. Left carotid system: No evidence of dissection, stenosis (50% or greater) or occlusion. 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 There is lobulation to the lumen of the ICA and a focal posteriorly directed sacculation/penetrating ulcer at the C2 level which measures 3 mm in depth (series 10, image 127). Vertebral arteries: Right dominant. No evidence of dissection, stenosis (50% or greater) or occlusion. Skeleton: Negative. Other neck: Negative. Upper chest: Negative. Review of the MIP images confirms the above findings CTA HEAD FINDINGS Anterior circulation: Moderate right A1 and right M2 origin stenosis. Multiple areas of mild stenosis in the bilateral M2 segments. No additional scratch proximal high-grade stenosis, large vessel occlusion, aneurysm, or vascular malformation. There is a beaded appearance with multiple segments of stenosis in both the MCA and ACA distributions involving small vessels. Posterior circulation: No significant stenosis, proximal occlusion, aneurysm, or vascular malformation. Venous sinuses: As permitted by contrast timing, patent. Anatomic variants: None significant. Delayed phase: No abnormal intracranial enhancement. Review of the MIP images confirms the above findings IMPRESSION: 1. Left-greater-than-right cervical ICA ectasia measuring up to 9 mm diameter on the left. Left upper ICA small posterior directed sacculation/penetrating ulcer at C2 level. Multiple segments of stenosis in the intracranial circulation including a beaded appearance in the small vessels of the anterior circulation. Relatively mild atherosclerotic disease. 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 2. No  dissection, large vessel occlusion, aneurysm, or vascular malformation identified. No high-grade stenosis in the neck. Electronically Signed   By: Kristine Garbe M.D.   On: 09/13/2018 05:57   Dg Chest 2 View  Result Date: 09/12/2018 CLINICAL DATA:  Numbness EXAM: CHEST - 2 VIEW COMPARISON:  05/14/2014 FINDINGS: The heart size and mediastinal contours are within normal limits. Both lungs are clear. The visualized skeletal structures are unremarkable. Postsurgical changes in the right shoulder are again seen. IMPRESSION: No active cardiopulmonary disease. Electronically Signed   By: Linus Mako.D.  On: 09/12/2018 17:47   Ct Head Wo Contrast  Result Date: 09/12/2018 CLINICAL DATA:  Right leg tingling for several days, headaches, initial encounter EXAM: CT HEAD WITHOUT CONTRAST TECHNIQUE: Contiguous axial images were obtained from the base of the skull through the vertex without intravenous contrast. COMPARISON:  None. FINDINGS: Brain: No evidence of acute infarction, hemorrhage, hydrocephalus, extra-axial collection or mass lesion/mass effect. Mild atrophic changes are noted. Vascular: No hyperdense vessel or unexpected calcification. Skull: Normal. Negative for fracture or focal lesion. Sinuses/Orbits: No acute finding. Other: None. IMPRESSION: Mild atrophic changes without acute abnormality. Electronically Signed   By: Inez Catalina M.D.   On: 09/12/2018 17:45   Ct Angio Neck W Or Wo Contrast  Result Date: 09/13/2018 CLINICAL DATA:  63 y/o  M; stroke for follow-up. EXAM: CT ANGIOGRAPHY HEAD AND NECK TECHNIQUE: Multidetector CT imaging of the head and neck was performed using the standard protocol during bolus administration of intravenous contrast. Multiplanar CT image reconstructions and MIPs were obtained to evaluate the vascular anatomy. Carotid stenosis measurements (when applicable) are obtained utilizing NASCET criteria, using the distal internal carotid diameter as the denominator.  CONTRAST:  151mL OMNIPAQUE IOHEXOL 350 MG/ML SOLN COMPARISON:  09/12/2018 CT head.  09/13/2018 MRI head. FINDINGS: CTA NECK FINDINGS Aortic arch: Standard branching. Imaged portion shows no evidence of aneurysm or dissection. No significant stenosis of the major arch vessel origins. Right carotid system: No evidence of dissection, stenosis (50% or greater) or occlusion. Mild ectasia of the mid cervical ICA. Left carotid system: No evidence of dissection, stenosis (50% or greater) or occlusion. 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 There is lobulation to the lumen of the ICA and a focal posteriorly directed sacculation/penetrating ulcer at the C2 level which measures 3 mm in depth (series 10, image 127). Vertebral arteries: Right dominant. No evidence of dissection, stenosis (50% or greater) or occlusion. Skeleton: Negative. Other neck: Negative. Upper chest: Negative. Review of the MIP images confirms the above findings CTA HEAD FINDINGS Anterior circulation: Moderate right A1 and right M2 origin stenosis. Multiple areas of mild stenosis in the bilateral M2 segments. No additional scratch proximal high-grade stenosis, large vessel occlusion, aneurysm, or vascular malformation. There is a beaded appearance with multiple segments of stenosis in both the MCA and ACA distributions involving small vessels. Posterior circulation: No significant stenosis, proximal occlusion, aneurysm, or vascular malformation. Venous sinuses: As permitted by contrast timing, patent. Anatomic variants: None significant. Delayed phase: No abnormal intracranial enhancement. Review of the MIP images confirms the above findings IMPRESSION: 1. Left-greater-than-right cervical ICA ectasia measuring up to 9 mm diameter on the left. Left upper ICA small posterior directed sacculation/penetrating ulcer at C2 level. Multiple segments of stenosis in the intracranial  circulation including a beaded appearance in the small vessels of the anterior circulation. Relatively mild atherosclerotic disease. 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 2. No dissection, large vessel occlusion, aneurysm, or vascular malformation identified. No high-grade stenosis in the neck. Electronically Signed   By: Kristine Garbe M.D.   On: 09/13/2018 05:57   [name] Contrast  Result Date: 09/13/2018 CLINICAL DATA:  63 y/o M; multiple episodes of weakness and feeling off balance since 09/07/2018. Focal neuro deficit, > 6 hrs, stroke suspected. EXAM: MRI HEAD WITHOUT CONTRAST TECHNIQUE: Multiplanar, multiecho pulse sequences of the brain and surrounding structures were obtained without  intravenous contrast. COMPARISON:  09/12/2018 CT head. 01/19/2017 MRI head. 01/19/2017 MRA of the head. FINDINGS: Brain: Subcentimeter focus of reduced diffusion within the cortex of left superior precentral gyrus (series 5, image 85) compatible with acute/early subacute infarction. No associated hemorrhage or mass effect. No additional findings for stroke, hemorrhage, extra-axial collection, hydrocephalus, mass effect, or herniation. There is a very small chronic infarction within the right cerebellar hemisphere. Few punctate nonspecific T2 FLAIR hyperintensities in subcortical white matter are compatible with mild chronic microvascular ischemic changes. Mild volume loss of the brain. Vascular: Curvilinear interrupted increased susceptibility hypointensity, likely associated with vessels in the left posterior sylvian fissure (series 4, image 35). No signal abnormality on additional sequences or prior CT. Skull and upper cervical spine: Normal marrow signal. Sinuses/Orbits: Negative. Other: None. IMPRESSION: 1. Subcentimeter acute/early subacute infarction within the cortex of left superior precentral gyrus. No associated  hemorrhage or mass effect. 2. Increased susceptibility signal appears associated with blood vessel in the left posterior sylvian fissure of uncertain significance, possibly hyperemia associated with recent ischemia given adjacent infarction. No vascular malformation is present this location prior MRA of the head. Multiple thromboemboli or subarachnoid hemorrhage is considered unlikely in the absence of secondary findings. 3. Mild chronic microvascular ischemic changes of the brain. Very small chronic infarction in the right cerebellar hemisphere. These results were called by telephone at the time of interpretation on 09/13/2018 at 1:37 am to Dr. Pryor Curia , who verbally acknowledged these results. Electronically Signed   By: Kristine Garbe M.D.   On: 09/13/2018 01:45    EKG: Independently reviewed.  Sinus rhythm (heart rate 58), PACs.  Assessment/Plan Principal Problem:   Acute CVA (cerebrovascular accident) ([place]) Active Problems:   Leukocytosis   AF (paroxysmal atrial fibrillation) ([place])   Acute ischemic stroke Presenting with complaints of RLE weakness a week ago, RUE weakness a day ago along with paresthesias in his right hand.  No deficits at present.  Brain MRI showing subcentimeter acute/early subacute infarction within the cortex of the left superior precentral gyrus.  No associated hemorrhage or mass-effect. -Neurology following, appreciate recommendations -Frequent neurochecks -Permissive hypertension -Continue Eliquis -High intensity statin -PT/OT/SLP eval -Check A1c, lipid panel -Echocardiogram -CT angiogram head and neck  Mild leukocytosis Likely reactive.  White count 13.9.  Chest x-ray without evidence of pneumonia.  UA not suggestive of infection. -Continue to monitor CBC  Paroxysmal atrial fibrillation -Currently in sinus rhythm. Continue Eliquis.  Unable to safely order home medications at this time as pharmacy medication reconciliation is pending.  DVT  prophylaxis: Eliquis Code Status: Full code Family Communication: No family available. Disposition Plan: Anticipate discharge after clinical improvement. Consults called: None Admission status: Observation, telemetry  This chart was dictated using voice recognition software.  Despite best efforts to proofread, errors can occur which can change the documentation meaning.  Shela Leff MD [facility] Pager [phone]  If 7PM-7AM, please contact night-coverage [email or web address] Password [email or web address]  09/13/2018, 10:31 AM

## 2018-09-13 NOTE — Consult Note (Addendum)
Neurology Consultation  Reason for Consult: Numbness/weakness on the right Referring Physician: Dr. Leonides Schanz  CC: Right-sided numbness weakness  History is obtained from: Patient, chart  HPI: [name] [name] is a 63 y.o. male past medical history of polycythemia, hypertension, PVCs, brief PAT and paroxysmal atrial fibrillation seen on monitor in February 2020-on Eliquis, presenting to the emergency room for evaluation of about 5 days worth of right upper and lower extremity paresthesias. He described his symptoms started on this past Sunday with his right leg feeling "rubbery" when he was trying to get up after watching TV.  He did not make much of it, went to bed and the symptoms completely resolved by the next morning.  Then about 3 to 4 days later which is Friday morning 09/12/2018, he was driving to work and felt that his right arm and hand as not feeling normal-he describes the same "" rubbery feeling "in that right hand.  Because the symptoms persisted and were not normal to him, he came to the emergency room at [facility].  I was contacted over the phone about the patient's symptoms.  I recommended that an MRI be done.  He was transferred from [facility] to [facility] for an MRI which was done and showed a small left sided stroke. The patient denies any history of recent palpitations chest pain shortness of breath.  Denies any cough or chills or fevers. Denies headaches or visual changes.  Denies any frank weakness.   LKW: 8 AM on 09/12/2018-in either case outside the window for TPA tpa given?: no, outside the window for TPA Premorbid modified Rankin scale (mRS): 0   ROS: ROS was performed and is negative except as noted in the HPI.  Past Medical History:  Diagnosis Date  . Diverticulosis of colon (without mention of hemorrhage)   . Hemorrhoids   . Hypertension   . IBS (irritable bowel syndrome)   . Iron deficiency anemia due to chronic blood loss 09/26/2017  .  Irregular heartbeat   . Nephrolithiasis   . Other and unspecified hyperlipidemia   . Perirectal fistula   . Polycythemia, secondary   . Stye    blocker duct right eye lid    Family History  Problem Relation Age of Onset  . Aortic aneurysm Father   . Heart attack Brother   . Colon cancer Neg Hx   . Esophageal cancer Neg Hx   . Pancreatic cancer Neg Hx   . Prostate cancer Neg Hx   . Rectal cancer Neg Hx   . Stomach cancer Neg Hx     Social History:   reports that he has never smoked. He has never used smokeless tobacco. He reports current alcohol use. He reports that he does not use drugs.  Denies tobacco, illicit drugs or alcohol abuse.  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  Medications No current facility-administered medications for this encounter.   Current Outpatient Medications:  .  acetaminophen (TYLENOL) 500 MG tablet, Take 1,000 mg by mouth every 6 (six) hours as needed for mild pain. , Disp: , Rfl:  .  anagrelide (AGRYLIN) 1 MG capsule, TAKE 1 CAPSULE (1 MG TOTAL) BY MOUTH 2 (TWO) TIMES DAILY., Disp: 180 capsule, Rfl: 1 .  apixaban (ELIQUIS) 5 MG TABS tablet, Take 1 tablet (5 mg total) by mouth 2 (two) times daily., Disp: 60 tablet, Rfl: 6 .  bu[facility]  SR) 150 MG 12 hr tablet, Take 150 mg by mouth every morning., Disp: , Rfl: 12 .  fluticasone (FLONASE) 50 MCG/ACT nasal spray, Place 1 spray into both nostrils daily as needed for allergies. , Disp: , Rfl:  .  ibuprofen (ADVIL) 200 MG tablet, Take 400 mg by mouth every 6 (six) hours as needed for moderate pain., Disp: , Rfl:  .  propranolol ER (INDERAL LA) 160 MG SR capsule, Take 1 capsule (160 mg total) by mouth daily., Disp: 90 capsule, Rfl: 3 .  zolpidem (AMBIEN CR) 12.5 MG CR tablet, Take 6.25 mg by mouth at bedtime. , Disp: , Rfl:   Exam: Current vital signs: BP (!) 161/81   Pulse (!) 58   Temp 98.5 F (36.9 C) (Oral)    Resp 18   Ht 5\' 11"  (1.803 m)   Wt 91.2 kg   SpO2 97%   BMI 28.03 kg/m  Vital signs in last 24 hours: Temp:  [98.5 F (36.9 C)] 98.5 F (36.9 C) (05/08 1608) Pulse Rate:  [58-67] 58 (05/08 2345) Resp:  [13-19] 18 (05/08 2345) BP: (131-161)/(75-90) 161/81 (05/08 2345) SpO2:  [97 %-100 %] 97 % (05/08 2345) Weight:  [91.2 kg] 91.2 kg (05/08 1608)  GENERAL: Awake, alert in NAD HEENT: - Normocephalic and atraumatic, dry mm, no LN++, no Thyromegally LUNGS - Clear to auscultation bilaterally with no wheezes CV - S1S2 RRR, no m/r/g, equal pulses bilaterally. ABDOMEN - Soft, nontender, nondistended with normoactive BS Ext: warm, well perfused, intact peripheral pulses, no edema  NEURO:  Mental Status: AA&Ox3  Language: speech is non-dysarthric.  Naming, repetition, fluency, and comprehension intact. Cranial Nerves: PERRLk. EOMI, visual fields full, no facial asymmetry,*facial sensation intact, hearing intact, tongue/uvula/soft palate midline, normal sternocleidomastoid and trapezius muscle strength. No evidence of tongue atrophy or fibrillations Motor: 5/5 with no drift in all fours Tone: is normal and bulk is normal Sensation- Intact to light touch bilateral Coordination: FTN intact bilaterally, no ataxia in BLE. Gait- deferred  NIHSS-0   Labs I have reviewed labs in epic and the results pertinent to this consultation are:  CBC    Component Value Date/Time   WBC 13.1 (H) 09/12/2018 1714   RBC 4.44 09/12/2018 1714   HGB 12.2 (L) 09/12/2018 1714   HGB 12.1 (L) 09/04/2018 1441   HGB 13.4 04/25/2017 1506   HGB 15.2 11/06/2007 1542   HCT 37.3 (L) 09/12/2018 1714   HCT 39.8 04/25/2017 1506   HCT 43.9 11/06/2007 1542   PLT 201 09/12/2018 1714   PLT 190 09/04/2018 1441   PLT 651 (H) 04/25/2017 1506   PLT 507 (H) 11/06/2007 1542   MCV 84.0 09/12/2018 1714   MCV 91 04/25/2017 1506   MCV 95.3 11/06/2007 1542   MCH 27.5 09/12/2018 1714   MCHC 32.7 09/12/2018 1714   RDW 20.0  (H) 09/12/2018 1714   RDW 21.4 (H) 04/25/2017 1506   RDW 14.9 (H) 11/06/2007 1542   LYMPHSABS 1.3 09/12/2018 1714   LYMPHSABS 1.1 04/25/2017 1506   LYMPHSABS 1.2 11/06/2007 1542   MONOABS 1.1 (H) 09/12/2018 1714   MONOABS 0.5 11/06/2007 1542   EOSABS 0.5 09/12/2018 1714   EOSABS 0.2 04/25/2017 1506   BASOSABS 0.3 (H) 09/12/2018 1714   BASOSABS 0.3 (H) 04/25/2017 1506   BASOSABS 0.0 11/06/2007 1542    CMP     Component Value Date/Time   NA 140 09/12/2018 1800   NA 144 04/25/2017 1506   NA 139 04/12/2016 1113  K 4.0 09/12/2018 1800   K 4.2 04/25/2017 1506   K 4.1 04/12/2016 1113   CL 107 09/12/2018 1800   CL 105 04/25/2017 1506   CO2 25 09/12/2018 1800   CO2 29 04/25/2017 1506   CO2 22 04/12/2016 1113   GLUCOSE 89 09/12/2018 1800   GLUCOSE 95 04/25/2017 1506   BUN 16 09/12/2018 1800   BUN 17 04/25/2017 1506   BUN 16.3 04/12/2016 1113   CREATININE 0.96 09/12/2018 1800   CREATININE 0.93 09/04/2018 1441   CREATININE 1.2 04/25/2017 1506   CREATININE 0.9 04/12/2016 1113   CALCIUM 8.8 (L) 09/12/2018 1800   CALCIUM 9.5 04/25/2017 1506   CALCIUM 9.1 04/12/2016 1113   PROT 6.4 (L) 09/12/2018 1800   PROT 6.4 04/25/2017 1506   PROT 7.0 04/12/2016 1113   ALBUMIN 4.3 09/12/2018 1800   ALBUMIN 3.8 04/25/2017 1506   ALBUMIN 4.6 09/12/2016 1457   ALBUMIN 4.2 04/12/2016 1113   AST 31 09/12/2018 1800   AST 34 09/04/2018 1441   AST 31 04/12/2016 1113   ALT 29 09/12/2018 1800   ALT 29 09/04/2018 1441   ALT 58 (H) 04/25/2017 1506   ALT 36 04/12/2016 1113   ALKPHOS 117 09/12/2018 1800   ALKPHOS 73 04/25/2017 1506   ALKPHOS 66 04/12/2016 1113   BILITOT 1.2 09/12/2018 1800   BILITOT 1.0 09/04/2018 1441   BILITOT 0.73 04/12/2016 1113   GFRNONAA >60 09/12/2018 1800   GFRNONAA >60 09/04/2018 1441   GFRAA >60 09/12/2018 1800   GFRAA >60 09/04/2018 1441    Imaging I have reviewed the images obtained: MRI examination of the brain-subcentimeter acute early subacute infarction  within the left superior precentral gyrus. There is increased susceptibility signal in the blood vessel in the left posterior sylvian fissure-uncertain significance-possibly hyperemia with recent ischemia, no vascular malformation is seen.  Multiple thromboemboli or subarachnoid hemorrhage is considered unlikely in the absence of secondary findings.  Assessment: 63 year old man, past medical history of paroxysmal atrial fibrillation/paroxysmal atrial tachycardia on Eliquis, hypertension, polycythemia, with 2 distinct episodes-first 1 5 days ago of leg numbness/paresthesias which he describes as a rubbery sensation on his legs and another one that started at 8 AM on 09/12/2018 when his right hand felt "rubbery", seemed to have a acute/early subacute infarct in the left superior precentral gyrus.  Outside the window for IV TPA, also NIH 0. No emergent LVO on clinical exam. Given his history, likely etiology is cardioembolic but a full stroke work-up should be pursued. His hematologist is Dr. Marin Olp His cardiologist is Dr. Stanford Breed He is currently on Eliquis.  He was on aspirin which was discontinued at some point.  Impression: Acute ischemic stroke  Recommendations: Continue with Eliquis-stroke is very small in size and very less likely to have hemorrhagic transformation. I would defer the decision regarding aspirin for prophylactic therapy to the stroke team rounding in the morning Maintain him on telemetry CT angiogram head and neck Check a 2D echocardiogram Check hemoglobin A1c Check fasting blood panel Frequent neurochecks Physical therapy Occupational Therapy Speech therapy N.p.o. until cleared by bedside swallow evaluation or speech therapy Primary team should touch base with cardiology and hematology to notify them that the patient has had a stroke.  I would imagine that the stroke team would want to change his Eliquis to another anti-coagulant and would defer to the morning the  decisions and discussions between the other specialists involved-cardiology and hematology.  Stroke team to follow  -- Amie Portland, MD [facility]  Pager: [phone] If 7pm to 7am, please call on call as listed on AMION.

## 2018-09-13 NOTE — Progress Notes (Signed)
Echocardiogram 2D Echocardiogram has been performed.  [name] 09/13/2018, 11:12 AM

## 2018-09-13 NOTE — ED Notes (Signed)
ED TO INPATIENT HANDOFF REPORT  ED Nurse Name and Phone #: Alveta Heimlich RN [phone]  S Name/Age/Gender [name] [name] 64 y.o. male Room/Bed: 027C/027C  Code Status   Code Status: Not on file  Home/SNF/Other Home Patient oriented to: self, place, time and situation Is this baseline? Yes   Triage Complete: Triage complete  Chief Complaint NUMBNESS  Triage Note Pt c/o right LE "felt like rubber" on 5/3-lasted x 10 min-today ~ 630am pt with right UE numbness, weakness-"felt out of sorts-like vertigo"-pt NAD-steady gait   Allergies No Known Allergies  Level of Care/Admitting Diagnosis ED Disposition    ED Disposition Condition [place]: [place] [100100]  Level of Care: Telemetry Medical [104]  I expect the patient will be discharged within 24 hours: Yes  LOW acuity---Tx typically complete <24 hrs---ACUTE conditions typically can be evaluated <24 hours---LABS likely to return to acceptable levels <24 hours---IS near functional baseline---EXPECTED to return to current living arrangement---NOT newly hypoxic: Meets criteria for 5C-Observation unit  Covid Evaluation: N/A  Diagnosis: Acute CVA (cerebrovascular accident) [facility]) [[id number]]  Admitting Physician: Shela Leff [[id number]]  Attending Physician: Shela Leff [[id number]]  PT Class (Do Not Modify): Observation [104]  PT Acc Code (Do Not Modify): Observation [10022]       B Medical/Surgery History Past Medical History:  Diagnosis Date  . Diverticulosis of colon (without mention of hemorrhage)   . Hemorrhoids   . Hypertension   . IBS (irritable bowel syndrome)   . Iron deficiency anemia due to chronic blood loss 09/26/2017  . Irregular heartbeat   . Nephrolithiasis   . Other and unspecified hyperlipidemia   . Perirectal fistula   . Polycythemia, secondary   . Stye    blocker duct right eye lid   Past Surgical History:  Procedure Laterality Date  . RETINAL DETACHMENT  SURGERY    . ROTATOR CUFF REPAIR     rt.  Marland Kitchen VASECTOMY       A IV Location/Drains/Wounds Patient Lines/Drains/Airways Status   Active Line/Drains/Airways    Name:   Placement date:   Placement time:   Site:   Days:   Peripheral IV 09/12/18 Left Antecubital   09/12/18    1751    Antecubital   1          Intake/Output Last 24 hours No intake or output data in the 24 hours ending 09/13/18 0424  Labs/Imaging Results for orders placed or performed during the hospital encounter of 09/12/18 (from the past 48 hour(s))  Ethanol     Status: None   Collection Time: 09/12/18  5:14 PM  Result Value Ref Range   Alcohol, Ethyl (B) <10 <10 mg/dL    Comment:        LOWEST DETECTABLE LIMIT FOR SERUM ALCOHOL IS 10 mg/dL FOR MEDICAL PURPOSES ONLY Performed at [facility], [address] [place], [place] [address]   Protime-INR     Status: Abnormal   Collection Time: 09/12/18  5:14 PM  Result Value Ref Range   Prothrombin Time 15.8 (H) 11.4 - 15.2 seconds   INR 1.3 (H) 0.8 - 1.2    Comment: (NOTE) INR goal varies based on device and disease states. Performed at [facility], [address] [place], [place] [address]   APTT     Status: Abnormal   Collection Time: 09/12/18  5:14 PM  Result Value Ref Range   aPTT 40 (H) 24 - 36  seconds    Comment:        IF BASELINE aPTT IS ELEVATED, SUGGEST PATIENT RISK ASSESSMENT BE USED TO DETERMINE APPROPRIATE ANTICOAGULANT THERAPY. Performed at [facility], [address] [place], [place] [address]   CBC     Status: Abnormal   Collection Time: 09/12/18  5:14 PM  Result Value Ref Range   WBC 13.1 (H) 4.0 - 10.5 K/uL   RBC 4.44 4.22 - 5.81 MIL/uL   Hemoglobin 12.2 (L) 13.0 - 17.0 g/dL   HCT 37.3 (L) 39.0 - 52.0 %   MCV 84.0 80.0 - 100.0 fL   MCH 27.5 26.0 - 34.0 pg   MCHC 32.7 30.0 - 36.0 g/dL   RDW 20.0 (H) 11.5 - 15.5 %   Platelets 201 150 - 400 K/uL   nRBC 2.0 (H) 0.0 - 0.2 %    Comment: Performed  at [facility], [address] [place], [place] [address]  Differential     Status: Abnormal   Collection Time: 09/12/18  5:14 PM  Result Value Ref Range   Neutrophils Relative % 67 %   Neutro Abs 9.1 (H) 1.7 - 7.7 K/uL   Lymphocytes Relative 10 %   Lymphs Abs 1.3 0.7 - 4.0 K/uL   Monocytes Relative 9 %   Monocytes Absolute 1.1 (H) 0.1 - 1.0 K/uL   Eosinophils Relative 4 %   Eosinophils Absolute 0.5 0.0 - 0.5 K/uL   Basophils Relative 2 %   Basophils Absolute 0.3 (H) 0.0 - 0.1 K/uL   WBC Morphology MILD LEFT SHIFT (1-5% METAS, OCC MYELO, OCC BANDS)     Comment: TOXIC GRANULATION   Immature Granulocytes 8 %   Abs Immature Granulocytes 1.02 (H) 0.00 - 0.07 K/uL   Tear Drop Cells PRESENT    Ovalocytes PRESENT     Comment: Performed at [facility], [address] [place], [place] [address]  Urine rapid drug screen (hosp performed)     Status: None   Collection Time: 09/12/18  5:23 PM  Result Value Ref Range   Opiates NONE DETECTED NONE DETECTED   Cocaine NONE DETECTED NONE DETECTED   Benzodiazepines NONE DETECTED NONE DETECTED   Amphetamines NONE DETECTED NONE DETECTED   Tetrahydrocannabinol NONE DETECTED NONE DETECTED   Barbiturates NONE DETECTED NONE DETECTED    Comment: (NOTE) DRUG SCREEN FOR MEDICAL PURPOSES ONLY.  IF CONFIRMATION IS NEEDED FOR ANY PURPOSE, NOTIFY LAB WITHIN 5 DAYS. LOWEST DETECTABLE LIMITS FOR URINE DRUG SCREEN Drug Class                     Cutoff (ng/mL) Amphetamine and metabolites    1000 Barbiturate and metabolites    200 Benzodiazepine                 440 Tricyclics and metabolites     300 Opiates and metabolites        300 Cocaine and metabolites        300 THC                            50 Performed at [facility], [address] [place], [place] [address]   Urinalysis, Routine w reflex microscopic     Status: Abnormal   Collection Time: 09/12/18  5:23 PM  Result Value Ref Range   Color, Urine YELLOW  YELLOW   APPearance CLEAR  CLEAR   Specific Gravity, Urine 1.020 1.005 - 1.030   pH 7.0 5.0 - 8.0   Glucose, UA NEGATIVE NEGATIVE mg/dL   Hgb urine dipstick TRACE (A) NEGATIVE   Bilirubin Urine NEGATIVE NEGATIVE   Ketones, ur NEGATIVE NEGATIVE mg/dL   Protein, ur NEGATIVE NEGATIVE mg/dL   Nitrite NEGATIVE NEGATIVE   Leukocytes,Ua NEGATIVE NEGATIVE    Comment: Performed at [facility], [address] [place], [place] [address]  Urinalysis, Microscopic (reflex)     Status: Abnormal   Collection Time: 09/12/18  5:23 PM  Result Value Ref Range   RBC / HPF 6-10 0 - 5 RBC/hpf   WBC, UA 0-5 0 - 5 WBC/hpf   Bacteria, UA FEW (A) NONE SEEN   Squamous Epithelial / LPF 0-5 0 - 5    Comment: Performed at [facility], [address] [place], [place] [address]  SARS Coronavirus 2 (Hosp order,Performed in [facility] lab via [employer])     Status: None   Collection Time: 09/12/18  5:45 PM  Result Value Ref Range   SARS Coronavirus 2 ([employer] Now) NEGATIVE NEGATIVE    Comment: (NOTE) Interpretive Result Comment(s): COVID 19 Positive SARS CoV 2 target nucleic acids are DETECTED. The SARS CoV 2 RNA is generally detectable in upper and lower respiratory specimens during the acute phase of infection.  Positive results are indicative of active infection with SARS CoV 2.  Clinical correlation with patient history and other diagnostic information is necessary to determine patient infection status.  Positive results do not rule out bacterial infection or coinfection with other viruses. The expected result is Negative. COVID 19 Negative SARS CoV 2 target nucleic acids are NOT DETECTED. The SARS CoV 2 RNA is generally detectable in upper and lower respiratory specimens during the acute phase of infection.  Negative results do not preclude SARS CoV 2 infection, do not rule out coinfections with other pathogens, and should not be used as the sole basis for treatment or other  patient management decisions.  Negative results must be combined with clinical  observations, patient history, and epidemiological information. The expected result is Negative. Invalid Presence or absence of SARS CoV 2 nucleic acids cannot be determined. Repeat testing was performed on the submitted specimen and repeated Invalid results were obtained.  If clinically indicated, additional testing on a new specimen with an alternate test methodology [phone]) is advised.  The SARS CoV 2 RNA is generally detectable in upper and lower respiratory specimens during the acute phase of infection. The expected result is Negative. Fact Sheet for Patients:  GolfingFamily.no Fact Sheet for Healthcare Providers: https://www.hernandez-brewer.com/ This test is not yet approved or cleared by the [place] FDA and has been authorized for detection and/or diagnosis of SARS CoV 2 by FDA under an Emergency Use Authorization (EUA).  This EUA will remain in effect (meaning this test can be used) for the duration of the COVID19 d eclaration under Section 564(b)(1) of the Act, 21 U.S.C. section [phone] 3(b)(1), unless the authorization is terminated or revoked sooner. Performed at [facility], [address] [place], [place] [address]   Comprehensive metabolic panel     Status: Abnormal   Collection Time: 09/12/18  6:00 PM  Result Value Ref Range   Sodium 140 135 - 145 mmol/L   Potassium 4.0 3.5 - 5.1 mmol/L   Chloride 107 98 - 111 mmol/L   CO2 25 22 - 32 mmol/L  Glucose, Bld 89 70 - 99 mg/dL   BUN 16 8 - 23 mg/dL   Creatinine, Ser 0.96 0.61 - 1.24 mg/dL   Calcium 8.8 (L) 8.9 - 10.3 mg/dL   Total Protein 6.4 (L) 6.5 - 8.1 g/dL   Albumin 4.3 3.5 - 5.0 g/dL   AST 31 15 - 41 U/L   ALT 29 0 - 44 U/L   Alkaline Phosphatase 117 38 - 126 U/L   Total Bilirubin 1.2 0.3 - 1.2 mg/dL   GFR calc non Af Amer >60 >60 mL/min   GFR calc Af Amer >60 >60 mL/min    Anion gap 8 5 - 15    Comment: Performed at [facility], [address] [place], [place] [address]   Dg Chest 2 View  Result Date: 09/12/2018 CLINICAL DATA:  Numbness EXAM: CHEST - 2 VIEW COMPARISON:  05/14/2014 FINDINGS: The heart size and mediastinal contours are within normal limits. Both lungs are clear. The visualized skeletal structures are unremarkable. Postsurgical changes in the right shoulder are again seen. IMPRESSION: No active cardiopulmonary disease. Electronically Signed   By: Inez Catalina M.D.   On: 09/12/2018 17:47   Ct Head Wo Contrast  Result Date: 09/12/2018 CLINICAL DATA:  Right leg tingling for several days, headaches, initial encounter EXAM: CT HEAD WITHOUT CONTRAST TECHNIQUE: Contiguous axial images were obtained from the base of the skull through the vertex without intravenous contrast. COMPARISON:  None. FINDINGS: Brain: No evidence of acute infarction, hemorrhage, hydrocephalus, extra-axial collection or mass lesion/mass effect. Mild atrophic changes are noted. Vascular: No hyperdense vessel or unexpected calcification. Skull: Normal. Negative for fracture or focal lesion. Sinuses/Orbits: No acute finding. Other: None. IMPRESSION: Mild atrophic changes without acute abnormality. Electronically Signed   By: Inez Catalina M.D.   On: 09/12/2018 17:45   [name] Contrast  Result Date: 09/13/2018 CLINICAL DATA:  63 y/o M; multiple episodes of weakness and feeling off balance since 09/07/2018. Focal neuro deficit, > 6 hrs, stroke suspected. EXAM: MRI HEAD WITHOUT CONTRAST TECHNIQUE: Multiplanar, multiecho pulse sequences of the brain and surrounding structures were obtained without intravenous contrast. COMPARISON:  09/12/2018 CT head. 01/19/2017 MRI head. 01/19/2017 MRA of the head. FINDINGS: Brain: Subcentimeter focus of reduced diffusion within the cortex of left superior precentral gyrus (series 5, image 85) compatible with acute/early subacute infarction. No  associated hemorrhage or mass effect. No additional findings for stroke, hemorrhage, extra-axial collection, hydrocephalus, mass effect, or herniation. There is a very small chronic infarction within the right cerebellar hemisphere. Few punctate nonspecific T2 FLAIR hyperintensities in subcortical white matter are compatible with mild chronic microvascular ischemic changes. Mild volume loss of the brain. Vascular: Curvilinear interrupted increased susceptibility hypointensity, likely associated with vessels in the left posterior sylvian fissure (series 4, image 35). No signal abnormality on additional sequences or prior CT. Skull and upper cervical spine: Normal marrow signal. Sinuses/Orbits: Negative. Other: None. IMPRESSION: 1. Subcentimeter acute/early subacute infarction within the cortex of left superior precentral gyrus. No associated hemorrhage or mass effect. 2. Increased susceptibility signal appears associated with blood vessel in the left posterior sylvian fissure of uncertain significance, possibly hyperemia associated with recent ischemia given adjacent infarction. No vascular malformation is present this location prior MRA of the head. Multiple thromboemboli or subarachnoid hemorrhage is considered unlikely in the absence of secondary findings. 3. Mild chronic microvascular ischemic changes of the brain. Very small chronic infarction in the right cerebellar hemisphere. These results were called by telephone at the  time of interpretation on 09/13/2018 at 1:37 am to Dr. Pryor Curia , who verbally acknowledged these results. Electronically Signed   By: Kristine Garbe M.D.   On: 09/13/2018 01:45   US Abdomen Complete  Result Date: 09/11/2018 CLINICAL DATA:  Polycythemia, assess for splenomegaly EXAM: ABDOMEN ULTRASOUND COMPLETE COMPARISON:  CT abdomen/pelvis dated 08/27/2018. FINDINGS: Gallbladder: Adherent gallstones vs gallbladder polyps measuring up to 5 mm. No sonographic Murphy sign noted  by sonographer. Common bile duct: Diameter: 4 mm Liver: Within the upper limits of normal for parenchymal echogenicity. No focal hepatic lesion is seen. Portal vein is patent on color Doppler imaging with normal direction of blood flow towards the liver. IVC: No abnormality visualized. Pancreas: Visualized portion unremarkable. Spleen: Enlarged, measuring 22.2 x 8.0 x 22.0 cm (calculated volume 2037 mL). Right Kidney: Length: 10.7 mL. Echogenicity within normal limits. No mass or hydronephrosis visualized. Left Kidney: Length: 11.7 mL. Echogenicity within normal limits. 1.9 x 1.7 x 2.0 cm mildly irregular cysts without solid component. No hydronephrosis. Abdominal aorta: No aneurysm visualized. Other findings: None. IMPRESSION: Splenomegaly, measuring 22.2 cm (calculated volume 2037 mL), as above. Electronically Signed   By: Julian Hy M.D.   On: 09/11/2018 10:37    Pending Labs Unresulted Labs (From admission, onward)   None      Vitals/Pain Today's Vitals   09/12/18 1830 09/12/18 1930 09/12/18 2035 09/12/18 2345  BP: 131/86 131/75 (!) 148/81 (!) 161/81  Pulse: 62 (!) 58 67 (!) 58  Resp: 19 19 19 18   Temp:      TempSrc:      SpO2: 99% 99% 99% 97%  Weight:      Height:      PainSc:        Isolation Precautions No active isolations  Medications Medications - No data to display  Mobility walks Low fall risk   Focused Assessments Neuro Assessment Handoff:  Swallow screen pass? Yes    NIH Stroke Scale ( + Modified Stroke Scale Criteria)  LOC Questions (1b. )   +: Answers both questions correctly LOC Commands (1c. )   + : Performs both tasks correctly Best Gaze (2. )  +: Normal Visual (3. )  +: No visual loss Motor Arm, Left (5a. )   +: No drift Motor Arm, Right (5b. )   +: No drift Motor Leg, Left (6a. )   +: No drift Motor Leg, Right (6b. )   +: No drift Sensory (8. )   +: Normal, no sensory loss Best Language (9. )   +: No aphasia Extinction/Inattention (11.)    +: No Abnormality Modified SS Total  +: 0     Neuro Assessment:   Neuro Checks:      Last Documented NIHSS Modified Score: 0 (09/12/18 1939) Has TPA been given? No If patient is a Neuro Trauma and patient is going to OR before floor call report to [facility] nurse: [phone] or [phone]     R Recommendations: See Admitting Provider Note  Report given to:   Additional Notes: [name] [name] [phone]

## 2018-09-13 NOTE — Progress Notes (Signed)
Pt called nurse to the room to show that his right hand is now shaking along with being numb and tingling. Grips are equal on both sides. Dr. Posey Pronto and Dr. Cheral Marker notified. Nurse will continue to monitor. [place]

## 2018-09-13 NOTE — Evaluation (Signed)
Physical Therapy Evaluation Patient Details Name: [name] [name] MRN: [id number] DOB: [birth year] Today's Date: 09/13/2018   History of Present Illness  Patient began having  right leg weakness 2 days ago. It resolved then he began having the same feeling in his right upper extremity. He presented to the hospital where it was found that he had a left precentral gyrus ischemic infarct. He feels like things are improving but he is still having limitationins in his right hand. PMH: HTN, Kidney stones, IBS; irregular heartbeat  Clinical Impression  Patient appears to be at baseline mobility. He feels like his hand is weaker then it has been. He was shown how to work on gripping and finger opposition. He had no loss of balance with high level balance testing. He has no need for skilled therapy at this time. Therapy strongly advised him to watch for symptoms worsening and to tell his MD if they do.     Follow Up Recommendations No PT follow up    Equipment Recommendations       Recommendations for Other Services       Precautions / Restrictions Precautions Precautions: None      Mobility  Bed Mobility Overal bed mobility: Independent             General bed mobility comments: Patient found standing in the room. He has been getting up on his own.   Transfers Overall transfer level: Independent               General transfer comment: Patient fiund already astanding in his rokm. He reports no loss of balance hjust an off feeling in his leg and arm.   Ambulation/Gait Ambulation/Gait assistance: Modified independent (Device/Increase time) Gait Distance (Feet): 40 Feet Assistive device: None Gait Pattern/deviations: WFL(Within Functional Limits) Gait velocity: normal      Stairs            Wheelchair Mobility    Modified Rankin (Stroke Patients Only) Modified Rankin (Stroke Patients Only) Pre-Morbid Rankin Score: No symptoms Modified Rankin: No significant  disability     Balance Overall balance assessment: Independent Sitting-balance support: No upper extremity supported Sitting balance-Leahy Scale: Good     Standing balance support: No upper extremity supported Standing balance-Leahy Scale: Good     Single Leg Stance - Left Leg: 1(no lob ) Tandem Stance - Right Leg: 1(no LOB )   Rhomberg - Eyes Opened: 30(sec No lob )   High level balance activites: Sudden stops;Head turns High Level Balance Comments: No loB with changes in speed ; driection; and head direction.              Pertinent Vitals/Pain Pain Assessment: No/denies pain    Home Living Family/patient expects to be discharged to:: Private residence Living Arrangements: Spouse/significant other Available Help at Discharge: Family;Available PRN/intermittently Type of Home: House Home Access: Stairs to enter   Entrance Stairs-Number of Steps: 2 Home Layout: Multi-level;Able to live on main level with bedroom/bathroom        Prior Function Level of Independence: Independent         Comments: driving and working     Hand Dominance   Dominant Hand: Right    Extremity/Trunk Assessment   Upper Extremity Assessment Upper Extremity Assessment: Defer to OT evaluation    Lower Extremity Assessment Lower Extremity Assessment: Overall WFL for tasks assessed(feels like it is different feeling but no strength deficits )       Communication   Communication: No difficulties  Cognition Arousal/Alertness: Awake/alert Behavior During Therapy: WFL for tasks assessed/performed Overall Cognitive Status: Within Functional Limits for tasks assessed                                        General Comments      Exercises     Assessment/Plan    PT Assessment Patent does not need any further PT services  PT Problem List         PT Treatment Interventions      PT Goals (Current goals can be found in the Care Plan section)  Acute Rehab PT  Goals Patient Stated Goal: to go home  PT Goal Formulation: With patient Time For Goal Achievement: 09/20/18 Potential to Achieve Goals: Good    Frequency     Barriers to discharge        Co-evaluation               AM-PAC PT "6 Clicks" Mobility  Outcome Measure Help needed turning from your back to your side while in a flat bed without using bedrails?: None Help needed moving from lying on your back to sitting on the side of a flat bed without using bedrails?: None Help needed moving to and from a bed to a chair (including a wheelchair)?: None Help needed standing up from a chair using your arms (e.g., wheelchair or bedside chair)?: None Help needed to walk in hospital room?: None Help needed climbing 3-5 steps with a railing? : None 6 Click Score: 24    End of Session Equipment Utilized During Treatment: Gait belt Activity Tolerance: Patient tolerated treatment well Patient left: in chair;with call bell/phone within reach Nurse Communication: Mobility status PT Visit Diagnosis: Other abnormalities of gait and mobility (R26.89)    Time: 0955-1010 PT Time Calculation (min) (ACUTE ONLY): 15 min   Charges:   PT Evaluation $PT Eval Low Complexity: 1 Low           Carney Living PT DPT  09/13/2018, 12:41 PM

## 2018-09-13 NOTE — Evaluation (Signed)
Occupational Therapy Evaluation Patient Details Name: [name] [name] MRN: [id number] DOB: October 24, 1955 Today's Date: 09/13/2018    History of Present Illness Patient began having  right leg weakness 2 days ago. It resolved then he began having the same feeling in his right upper extremity. He presented to the hospital where it was found that he had a left precentral gyrus ischemic infarct. He feels like things are improving but he is still having limitationins in his right hand. PMH: HTN, Kidney stones, IBS; irregular heartbeat   Clinical Impression   Pt PTA: independent with ADL and mobility. Pt currently remains independent with ADL with balance challenges and ambulating in hallway with independence. Pt able to pick items off of floor with no LOB and able to maneuver in hallway with obstacles with good balance. Pt performing tasks with some [place] and slower reaction speed with R hand which remains partially numb. Britt exercises provided for pt and education provided. Pt's RUE coordination remains fair and pt reports improvement since yesterday. Pt at functional baseline and education provided for RUE. OT signing off.    Follow Up Recommendations  No OT follow up    Equipment Recommendations  None recommended by OT    Recommendations for Other Services       Precautions / Restrictions Precautions Precautions: None Restrictions Weight Bearing Restrictions: No      Mobility Bed Mobility Overal bed mobility: Independent             General bed mobility comments: Patient found standing in the room. He has been getting up on his own.   Transfers Overall transfer level: Independent               General transfer comment: good balance     Balance Overall balance assessment: Independent Sitting-balance support: No upper extremity supported Sitting balance-Leahy Scale: Good     Standing balance support: No upper extremity supported Standing balance-Leahy Scale: Good      Single Leg Stance - Left Leg: 1(no lob ) Tandem Stance - Right Leg: 1(no LOB )   Rhomberg - Eyes Opened: 30(sec No lob )   High level balance activites: Sudden stops;Head turns High Level Balance Comments: no LOB with picking items from floor and to avoid obstacles           ADL either performed or assessed with clinical judgement   ADL Overall ADL's : At baseline                                       General ADL Comments: independent with ADL and mobility.     Vision Baseline Vision/History: Wears glasses Vision Assessment?: No apparent visual deficits     Perception     Praxis      Pertinent Vitals/Pain Pain Assessment: No/denies pain     Hand Dominance Right   Extremity/Trunk Assessment Upper Extremity Assessment Upper Extremity Assessment: Overall WFL for tasks assessed   Lower Extremity Assessment Lower Extremity Assessment: Overall WFL for tasks assessed   Cervical / Trunk Assessment Cervical / Trunk Assessment: Normal   Communication Communication Communication: No difficulties   Cognition Arousal/Alertness: Awake/alert Behavior During Therapy: WFL for tasks assessed/performed Overall Cognitive Status: Within Functional Limits for tasks assessed  General Comments  143/80 post exertion.    Exercises Exercises: (reviewed gripping; finger oposition; LAQ, seated marchig)   Shoulder Instructions      Home Living Family/patient expects to be discharged to:: Private residence Living Arrangements: Spouse/significant other Available Help at Discharge: Family;Available PRN/intermittently Type of Home: House Home Access: Stairs to enter Entrance Stairs-Number of Steps: 2   Home Layout: Multi-level;Able to live on main level with bedroom/bathroom Alternate Level Stairs-Number of Steps: full flight   Bathroom Shower/Tub: Occupational psychologist: Handicapped height      Home Equipment: None          Prior Functioning/Environment Level of Independence: Independent        Comments: driving and working        OT Problem List:        OT Treatment/Interventions:      OT Goals(Current goals can be found in the care plan section) Acute Rehab OT Goals Patient Stated Goal: to go home   OT Frequency:     Barriers to D/C:            Co-evaluation              AM-PAC OT "6 Clicks" Daily Activity     Outcome Measure Help from another person eating meals?: None Help from another person taking care of personal grooming?: None Help from another person toileting, which includes using toliet, bedpan, or urinal?: None Help from another person bathing (including washing, rinsing, drying)?: None Help from another person to put on and taking off regular upper body clothing?: None Help from another person to put on and taking off regular lower body clothing?: None 6 Click Score: 24   End of Session Equipment Utilized During Treatment: Gait belt Nurse Communication: Mobility status  Activity Tolerance: Patient tolerated treatment well Patient left: in chair;with call bell/phone within reach  OT Visit Diagnosis: Muscle weakness (generalized) (M62.81)                Time: 1517-6160 OT Time Calculation (min): 27 min Charges:  OT General Charges $OT Visit: 1 Visit OT Evaluation $OT Eval Moderate Complexity: 1 Mod OT Treatments $Self Care/Home Management : 8-22 mins  Ebony Hail Harold Hedge) Marsa Aris OTR/L Acute Rehabilitation Services Pager: [phone] Office: [place] 09/13/2018, 1:26 PM

## 2018-09-13 NOTE — Progress Notes (Signed)
STROKE TEAM PROGRESS NOTE   HISTORY OF PRESENT ILLNESS (per record) [name] [name] is a 63 y.o. male past medical history of polycythemia, hypertension, PVCs, brief PAT and paroxysmal atrial fibrillation seen on monitor in February 2020-on Eliquis, presenting to the emergency room for evaluation of about 5 days worth of right upper and lower extremity paresthesias. He described his symptoms started on this past Sunday with his right leg feeling "rubbery" when he was trying to get up after watching TV.  He did not make much of it, went to bed and the symptoms completely resolved by the next morning.  Then about 3 to 4 days later which is Friday morning 09/12/2018, he was driving to work and felt that his right arm and hand as not feeling normal-he describes the same "" rubbery feeling "in that right hand.  Because the symptoms persisted and were not normal to him, he came to the emergency room at [facility].  I was contacted over the phone about the patient's symptoms.  I recommended that an MRI be done.  He was transferred from [facility] to [facility] for an MRI which was done and showed a small left sided stroke. The patient denies any history of recent palpitations chest pain shortness of breath.  Denies any cough or chills or fevers. Denies headaches or visual changes.  Denies any frank weakness.   LKW: 8 AM on 09/13/2018-in either case outside the window for TPA tpa given?: no, outside the window for TPA Premorbid modified Rankin scale (mRS): 0   SUBJECTIVE (INTERVAL HISTORY) He complains of intermittent numbness in the right face and hand.  No further weakness.  He claims full compliance with Eliquis prior to admission.  A. Fib detected in 06/2018- paroxysmal.    OBJECTIVE Vitals:   09/12/18 2035 09/12/18 2345 09/13/18 0500 09/13/18 0741  BP: (!) 148/81 (!) 161/81 (!) 148/82 (!) 152/79  Pulse: 67 (!) 58 67 (!) 58  Resp: 19 18 (!) 26 16  Temp:   98 F (36.7 C) 98.1 F  (36.7 C)  TempSrc:   Oral Oral  SpO2: 99% 97% 100% 99%  Weight:      Height:        CBC:  Recent Labs  Lab 09/12/18 1714  WBC 13.1*  NEUTROABS 9.1*  HGB 12.2*  HCT 37.3*  MCV 84.0  PLT 627    Basic Metabolic Panel:  Recent Labs  Lab 09/12/18 1800  NA 140  K 4.0  CL 107  CO2 25  GLUCOSE 89  BUN 16  CREATININE 0.96  CALCIUM 8.8*    Lipid Panel:     Component Value Date/Time   CHOL 142 09/13/2018 0634   TRIG 203 (H) 09/13/2018 0634   HDL 25 (L) 09/13/2018 0634   CHOLHDL 5.7 09/13/2018 0634   VLDL 41 (H) 09/13/2018 0634   LDLCALC 76 09/13/2018 0634   HgbA1c:  Lab Results  Component Value Date   HGBA1C 4.8 09/13/2018   Urine Drug Screen:     Component Value Date/Time   LABOPIA NONE DETECTED 09/12/2018 1723   COCAINSCRNUR NONE DETECTED 09/12/2018 1723   LABBENZ NONE DETECTED 09/12/2018 1723   AMPHETMU NONE DETECTED 09/12/2018 1723   THCU NONE DETECTED 09/12/2018 1723   LABBARB NONE DETECTED 09/12/2018 1723    Alcohol Level     Component Value Date/Time   ETH <10 09/12/2018 1714    IMAGING  [name] Contrast 09/13/2018 IMPRESSION:  1. Subcentimeter  acute/early subacute infarction within the cortex of left superior precentral gyrus. No associated hemorrhage or mass effect.  2. Increased susceptibility signal appears associated with blood vessel in the left posterior sylvian fissure of uncertain significance, possibly hyperemia associated with recent ischemia given adjacent infarction. No vascular malformation is present this location prior MRA of the head. Multiple thromboemboli or subarachnoid hemorrhage is considered unlikely in the absence of secondary findings.  3. Mild chronic microvascular ischemic changes of the brain. Very small chronic infarction in the right cerebellar hemisphere.    Ct Angio Head W Or Wo Contrast Ct Angio Neck W Or Wo Contrast  09/13/2018 IMPRESSION:  1. Left-greater-than-right cervical ICA ectasia measuring up to 9 mm  diameter on the left. Left upper ICA small posterior directed sacculation/penetrating ulcer at C2 level. Multiple segments of stenosis in the intracranial circulation including a beaded appearance in the small vessels of the anterior circulation. Relatively mild atherosclerotic disease. 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  2. No dissection, large vessel occlusion, aneurysm, or vascular malformation identified. No high-grade stenosis in the neck.    Ct Head Wo Contrast 09/12/2018 IMPRESSION:  Mild atrophic changes without acute abnormality.    Dg Chest 2 View 09/12/2018 IMPRESSION:  No active cardiopulmonary disease.    Transthoracic Echocardiogram  00/00/2020 Pending   EKG - SB rate 58 BPM. (See cardiology reading for complete details)    PHYSICAL EXAM Blood pressure (!) 152/79, pulse (!) 58, temperature 98.1 F (36.7 C), temperature source Oral, resp. rate 16, height 5\' 11"  (1.803 m), weight 91.2 kg, SpO2 99 %.  Awake, fully oriented, alert. Language -fluent, C/N/R- intact. PERL, EOMI, face symmetrical Tongue midline. Strength 5/5 BUE and BLE; no pronator drift FTN and HTN intact bilaterally Sensory - slightly reduced pinprick in right V2 and V3.   Gait - normal. No babinski.      ASSESSMENT/PLAN [name] with history of polycythemia, hypertension, PVCs, brief PAT and paroxysmal atrial fibrillation seen on monitor in February 2020-on Eliquis,  presenting with right upper and lower extremity paresthesias.  He did not receive IV t-PA due to anticoagulation and late presentation (>4.5 hours from time of onset).  Stroke:  Probable cardioembolic  Resultant  Right numbness and weakness, with latter resolved.  CT head - Mild atrophic changes without acute abnormality.   MRI head - Subcentimeter acute/early subacute infarction within the cortex of left  superior precentral gyrus.  Very small chronic infarction in the right cerebellar hemisphere.   MRA head - not performed  CTA H&N - Left upper ICA small posterior directed sacculation/penetrating ulcer at C2 level. 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  2D Echo - normal - No indication for TEE as we already know he has PAF and he is on anticoagulation already.  LDL - 76  HgbA1c - 4.8  UDS  - negative  VTE prophylaxis - Eliquis  Diet  - Heart healthy with thin liquids.  Eliquis (apixaban) daily prior to admission, now on Eliquis (apixaban) daily- He has had a stroke on Eliquis.  Studies have shown essentially that Eliquis is likely to be superior to Xarelto and is superior to Coumadin for secondary stroke prevention.   Therefore, may not make sense to  switch him to those.  We can add ASA 81 mg qd to his secondary stroke regimen, however, we are risking increased risk of hemorrhage.  Cardiologist can weigh in on whether he feels comfortable with adding this.    Hypertension  High SBP today . Permissive hypertension (OK if < 220/120) but gradually normalize in 5-7 days . Long-term BP goal normotensive  Hyperlipidemia  Lipid lowering medication PTA:  none  LDL 76, goal < 70  Current lipid lowering medication: Lipitor 40 mg daily  Continue statin at discharge  Other Stroke Risk Factors  Advanced age  ETOH use, advised to drink no more than 1 alcoholic beverage per day  Overweight. Body mass index is 28.03 kg/m., recommend weight loss, diet and exercise as appropriate   Hx stroke/TIA by imaging  Atrial fibrillation    Hospital day # 0  Rogue Jury, MS, MD  To contact Stroke Continuity provider, please refer to [email or web address] After hours,  contact General Neurology

## 2018-09-13 NOTE — Progress Notes (Signed)
Received the pt from the ED, alert and oriented X4, placed on Tele and verified, implemented orders as per MD, oriented to his room, no questions asked at this time

## 2018-09-13 NOTE — Progress Notes (Signed)
Patient is experiencing new R sided facial numbness. No facial droop. NIH unchanged. Dr. Posey Pronto notified. Nurse will continue to monitor. [address]

## 2018-09-13 NOTE — Progress Notes (Signed)
[facility] PLAN OF CARE NOTE Patient: [name] [name] YBR:[id number]   PCP: Leanna Battles, MD DOB: April 05, 1956   DOA: 09/12/2018   DOS: 09/13/2018    Patient was admitted by my colleague Dr. Marlowe Sax earlier on 09/13/2018. I have reviewed the H&P as well as assessment and plan and agree with the same. Important changes in the plan are listed below.  Plan of care: Principal Problem:   Acute CVA (cerebrovascular accident) [facility]) Active Problems:   Leukocytosis   AF (paroxysmal atrial fibrillation) ([place]) stroke work up per neurology  Add blood cultures.   Author: Berle Mull, MD [facility] 09/13/2018 6:20 PM   If 7PM-7AM, please contact night-coverage at [email or web address]

## 2018-09-14 ENCOUNTER — Inpatient Hospital Stay (HOSPITAL_COMMUNITY): Payer: Managed Care, Other (non HMO)

## 2018-09-14 LAB — SEDIMENTATION RATE: Sed Rate: 4 mm/hr (ref 0–16)

## 2018-09-14 LAB — CBC WITH DIFFERENTIAL/PLATELET
Abs Immature Granulocytes: 0.73 10*3/uL — ABNORMAL HIGH (ref 0.00–0.07)
Basophils Absolute: 0.2 10*3/uL — ABNORMAL HIGH (ref 0.0–0.1)
Basophils Relative: 2 %
Eosinophils Absolute: 0.5 10*3/uL (ref 0.0–0.5)
Eosinophils Relative: 5 %
HCT: 34.2 % — ABNORMAL LOW (ref 39.0–52.0)
Hemoglobin: 11.2 g/dL — ABNORMAL LOW (ref 13.0–17.0)
Immature Granulocytes: 7 %
Lymphocytes Relative: 10 %
Lymphs Abs: 1.1 10*3/uL (ref 0.7–4.0)
MCH: 26.9 pg (ref 26.0–34.0)
MCHC: 32.7 g/dL (ref 30.0–36.0)
MCV: 82.2 fL (ref 80.0–100.0)
Monocytes Absolute: 1.1 10*3/uL — ABNORMAL HIGH (ref 0.1–1.0)
Monocytes Relative: 10 %
Neutro Abs: 7.2 10*3/uL (ref 1.7–7.7)
Neutrophils Relative %: 66 %
Platelets: 207 10*3/uL (ref 150–400)
RBC: 4.16 MIL/uL — ABNORMAL LOW (ref 4.22–5.81)
RDW: 19.7 % — ABNORMAL HIGH (ref 11.5–15.5)
WBC: 10.8 10*3/uL — ABNORMAL HIGH (ref 4.0–10.5)
nRBC: 2.4 % — ABNORMAL HIGH (ref 0.0–0.2)

## 2018-09-14 LAB — COMPREHENSIVE METABOLIC PANEL
ALT: 28 U/L (ref 0–44)
AST: 28 U/L (ref 15–41)
Albumin: 3.8 g/dL (ref 3.5–5.0)
Alkaline Phosphatase: 120 U/L (ref 38–126)
Anion gap: 11 (ref 5–15)
BUN: 14 mg/dL (ref 8–23)
CO2: 24 mmol/L (ref 22–32)
Calcium: 8.7 mg/dL — ABNORMAL LOW (ref 8.9–10.3)
Chloride: 104 mmol/L (ref 98–111)
Creatinine, Ser: 0.97 mg/dL (ref 0.61–1.24)
GFR calc Af Amer: >60 mL/min (ref >60)
GFR calc non Af Amer: >60 mL/min (ref >60)
Glucose, Bld: 97 mg/dL (ref 70–99)
Potassium: 3.8 mmol/L (ref 3.5–5.1)
Sodium: 139 mmol/L (ref 135–145)
Total Bilirubin: 1.2 mg/dL (ref 0.3–1.2)
Total Protein: 5.6 g/dL — ABNORMAL LOW (ref 6.5–8.1)

## 2018-09-14 LAB — MAGNESIUM
Magnesium: 2.1 mg/dL (ref 1.7–2.4)
Magnesium: 2.2 mg/dL (ref 1.7–2.4)

## 2018-09-14 LAB — BASIC METABOLIC PANEL
Anion gap: 12 (ref 5–15)
BUN: 13 mg/dL (ref 8–23)
CO2: 22 mmol/L (ref 22–32)
Calcium: 8.8 mg/dL — ABNORMAL LOW (ref 8.9–10.3)
Chloride: 104 mmol/L (ref 98–111)
Creatinine, Ser: 0.96 mg/dL (ref 0.61–1.24)
GFR calc Af Amer: >60 mL/min (ref >60)
GFR calc non Af Amer: >60 mL/min (ref >60)
Glucose, Bld: 91 mg/dL (ref 70–99)
Potassium: 3.7 mmol/L (ref 3.5–5.1)
Sodium: 138 mmol/L (ref 135–145)

## 2018-09-14 LAB — C-REACTIVE PROTEIN: CRP: 0.9 mg/dL (ref <1.0)

## 2018-09-14 MED ORDER — ASPIRIN EC 81 MG PO TBEC
81.0000 mg | DELAYED_RELEASE_TABLET | Freq: Every day | ORAL | Status: DC
  Administered 2018-09-14 – 2018-09-15 (×2): 81 mg via ORAL
  Filled 2018-09-14 (×2): qty 1

## 2018-09-14 MED ORDER — POTASSIUM CHLORIDE CRYS ER 20 MEQ PO TBCR
40.0000 meq | EXTENDED_RELEASE_TABLET | Freq: Once | ORAL | Status: AC
  Administered 2018-09-14: 40 meq via ORAL
  Filled 2018-09-14: qty 2

## 2018-09-14 MED ORDER — LORAZEPAM 2 MG/ML IJ SOLN
1.0000 mg | Freq: Once | INTRAMUSCULAR | Status: AC
  Administered 2018-09-14: 1 mg via INTRAVENOUS
  Filled 2018-09-14: qty 1

## 2018-09-14 NOTE — Progress Notes (Signed)
STROKE TEAM PROGRESS NOTE   HISTORY OF PRESENT ILLNESS (per record) [name] [name] is a 63 y.o. male past medical history of polycythemia, hypertension, PVCs, brief PAT and paroxysmal atrial fibrillation seen on monitor in February 2020-on Eliquis, presenting to the emergency room for evaluation of about 5 days worth of right upper and lower extremity paresthesias. He described his symptoms started on this past Sunday with his right leg feeling "rubbery" when he was trying to get up after watching TV.  He did not make much of it, went to bed and the symptoms completely resolved by the next morning.  Then about 3 to 4 days later which is Friday morning 09/12/2018, he was driving to work and felt that his right arm and hand as not feeling normal-he describes the same "" rubbery feeling "in that right hand.  Because the symptoms persisted and were not normal to him, he came to the emergency room at [facility].  I was contacted over the phone about the patient's symptoms.  I recommended that an MRI be done.  He was transferred from [facility] to [facility] for an MRI which was done and showed a small left sided stroke. The patient denies any history of recent palpitations chest pain shortness of breath.  Denies any cough or chills or fevers. Denies headaches or visual changes.  Denies any frank weakness.   LKW: 8 AM on 09/13/2018-in either case outside the window for TPA tpa given?: no, outside the window for TPA Premorbid modified Rankin scale (mRS): 0   SUBJECTIVE (INTERVAL HISTORY) I was called to his room by RN for less than 5 minute episode of aphasia and right face and arm more prominent numbness.  No headache. BP is slightly high, but not severely elevated.    OBJECTIVE Vitals:   09/13/18 1610 09/13/18 1944 09/14/18 0011 09/14/18 0344  BP: (!) 154/76 137/78 (!) 143/77 132/68  Pulse: 64 73 64 (!) 57  Resp: 20 18 18 18   Temp: 97.8 F (36.6 C) 98.4 F (36.9 C) 98.3 F  (36.8 C) 97.9 F (36.6 C)  TempSrc: Oral Oral Oral Oral  SpO2: 100% 100% 98% 98%  Weight:      Height:        CBC:  Recent Labs  Lab 09/13/18 1254 09/14/18 0458  WBC 14.2* 10.8*  NEUTROABS 12.4* 7.2  HGB 12.1* 11.2*  HCT 36.8* 34.2*  MCV 82.0 82.2  PLT 261 242    Basic Metabolic Panel:  Recent Labs  Lab 09/12/18 1800 09/14/18 0458  NA 140 139  K 4.0 3.8  CL 107 104  CO2 25 24  GLUCOSE 89 97  BUN 16 14  CREATININE 0.96 0.97  CALCIUM 8.8* 8.7*  MG  --  2.1    Lipid Panel:     Component Value Date/Time   CHOL 142 09/13/2018 0634   TRIG 203 (H) 09/13/2018 0634   HDL 25 (L) 09/13/2018 0634   CHOLHDL 5.7 09/13/2018 0634   VLDL 41 (H) 09/13/2018 0634   LDLCALC 76 09/13/2018 0634   HgbA1c:  Lab Results  Component Value Date   HGBA1C 4.8 09/13/2018   Urine Drug Screen:     Component Value Date/Time   LABOPIA NONE DETECTED 09/12/2018 1723   COCAINSCRNUR NONE DETECTED 09/12/2018 1723   LABBENZ NONE DETECTED 09/12/2018 1723   AMPHETMU NONE DETECTED 09/12/2018 1723   THCU NONE DETECTED 09/12/2018 1723   LABBARB NONE DETECTED 09/12/2018 1723  Alcohol Level     Component Value Date/Time   [facility] <10 09/12/2018 1714    IMAGING  [name] Contrast 09/13/2018 IMPRESSION:  1. Subcentimeter acute/early subacute infarction within the cortex of left superior precentral gyrus. No associated hemorrhage or mass effect.  2. Increased susceptibility signal appears associated with blood vessel in the left posterior sylvian fissure of uncertain significance, possibly hyperemia associated with recent ischemia given adjacent infarction. No vascular malformation is present this location prior MRA of the head. Multiple thromboemboli or subarachnoid hemorrhage is considered unlikely in the absence of secondary findings.  3. Mild chronic microvascular ischemic changes of the brain. Very small chronic infarction in the right cerebellar hemisphere.    Ct Angio Head W Or Wo  Contrast Ct Angio Neck W Or Wo Contrast  09/13/2018 IMPRESSION:  1. Left-greater-than-right cervical ICA ectasia measuring up to 9 mm diameter on the left. Left upper ICA small posterior directed sacculation/penetrating ulcer at C2 level. Multiple segments of stenosis in the intracranial circulation including a beaded appearance in the small vessels of the anterior circulation. Relatively mild atherosclerotic disease. 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  2. No dissection, large vessel occlusion, aneurysm, or vascular malformation identified. No high-grade stenosis in the neck.    Ct Head Wo Contrast 09/12/2018 IMPRESSION:  Mild atrophic changes without acute abnormality.    Dg Chest 2 View 09/12/2018 IMPRESSION:  No active cardiopulmonary disease.    Transthoracic Echocardiogram  00/00/2020 Pending   EKG - SB rate 58 BPM. (See cardiology reading for complete details)    PHYSICAL EXAM Blood pressure 132/68, pulse (!) 57, temperature 97.9 F (36.6 C), temperature source Oral, resp. rate 18, height 5\' 11"  (1.803 m), weight 91.2 kg, SpO2 98 %.  Awake, fully oriented, alert. Language -fluent, C/N/R- intact. PERL, EOMI, face- mild facial droop on the right which seems to improve as I talked to him.   Tongue midline. Strength 5/5 BUE and BLE, except for 4/5 weakness in right  finger abduction and wrist extension.  There is a right pronator drift. FTN and HTN intact bilaterally Sensory - slightly reduced pinprick in right V2 and V3.   Gait - normal. No babinski.      ASSESSMENT/PLAN [name] with history of polycythemia, hypertension, PVCs, brief PAT and paroxysmal atrial fibrillation seen on monitor in February 2020-on Eliquis,  presenting with right upper and lower extremity paresthesias.  He did not receive IV t-PA due to anticoagulation and late presentation  (>4.5 hours from time of onset).  Stroke:  Probable cardioembolic  Resultant  Right numbness and weakness, with latter resolved.  CT head - Mild atrophic changes without acute abnormality.   MRI head - Subcentimeter acute/early subacute infarction within the cortex of left superior precentral gyrus.  Very small chronic infarction in the right cerebellar hemisphere.   MRA head - not performed  CTA H&N - Left upper ICA small posterior directed sacculation/penetrating ulcer at C2 level. 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  2D Echo - normal - No indication for TEE as we already know he has PAF and he is on anticoagulation already.  LDL - 76  HgbA1c - 4.8  UDS  - negative  VTE prophylaxis -  Eliquis  Diet  - Heart healthy with thin liquids.  Eliquis (apixaban) daily prior to admission, now on Eliquis (apixaban) daily- He has had a stroke on Eliquis.  Studies have shown essentially that Eliquis is likely to be superior to Xarelto and is superior to Coumadin for secondary stroke prevention.   Therefore, may not make sense to switch him to those.  We can add ASA 81 mg qd to his secondary stroke regimen, however, we are risking increased risk of hemorrhage.  Cardiologist can weigh in on whether he feels comfortable with adding this.    Hypertension  High SBP today . Permissive hypertension (OK if < 220/120) but gradually normalize in 5-7 days . Long-term BP goal normotensive  Hyperlipidemia  Lipid lowering medication PTA:  none  LDL 76, goal < 70  Current lipid lowering medication: Lipitor 40 mg daily  Continue statin at discharge  Other Stroke Risk Factors  Advanced age  ETOH use, advised to drink no more than 1 alcoholic beverage per day  Overweight.  Body mass index is 28.03 kg/m., recommend weight loss, diet and exercise as appropriate   Hx stroke/TIA by imaging  Atrial fibrillation    Patient seems to have had a TIA/minor stroke a few minutes ago.   I will do a stat CT Brain to ensure no hemorrhage from the Eliquis.  If negative, then will add ASA 81 mg qd for stroke prevention.  Consideration can be made to changing him totally to Pradaxa as well.  Transient mild elevation in BP likely due to recent TIA.  CNS Vasculitis in the differential too given some beading of vessels on CT angiogram.  Consideration for IV steroids will also be made.   Rogue Jury, MS, MD  To contact Stroke Continuity provider, please refer to [email or web address] After hours, contact General Neurology

## 2018-09-14 NOTE — Progress Notes (Signed)
Patient having difficulty speaking. He reported that he was having word difficulty and slurred speech on the phone with a friend. On assessment, patient is having a slight right facial droop. NIH 3. Dr. Posey Pronto notified via [place] and Dr. Orlena Sheldon notified in hallway. Dr. Orlena Sheldon at bedside. Nurse will continue to monitor. [place]

## 2018-09-14 NOTE — Progress Notes (Signed)
Ativan 1 mg IV given about 10 minutes ago.  No major change in the rhythmic hand movements.    I ordered ESR, CRP, ANA, ANCA as preliminary work up for possible CNS vasculitis.    Consideration can be given to CSF analysis and conventional angiogram next week if deemed appropriate.    EEG pending for tomorrow.    Cont Eliquis and ASA.    Rogue Jury, MS, MD

## 2018-09-14 NOTE — Progress Notes (Signed)
[facility] Progress Note  Patient: [name] [name] YIF:[id number]   PCP: Leanna Battles, MD DOB: 15-Mar-1956   DOA: 09/12/2018   DOS: 09/14/2018   Date of Service: the patient was seen and examined on 09/14/2018  Brief hospital course: Pt. with PMH of polycythemia, A. fib, HTN; admitted on 09/12/2018, presented with complaint of right-sided weakness and numbness, was found to have acute CVA. Currently further plan is continue for the stroke work-up.  Subjective: Patient continues to report fluctuating symptoms of right-sided weakness and numbness.  Also had right-sided on and off tremor-like events.  No nausea no vomiting no chest pain abdominal pain.  Assessment and Plan: Acute ischemic stroke Presenting with complaints of RLE weakness a week ago, RUE weakness a day ago along with paresthesias in his right hand.  No deficits at present.  Brain MRI showing subcentimeter acute/early subacute infarction within the cortex of the left superior precentral gyrus.  No associated hemorrhage or mass-effect. -Neurology following, appreciate recommendations -Frequent neurochecks -Permissive hypertension -Continue Eliquis, discussed with neurology as well as cardiology.  Okay to start the patient on Eliquis as well as aspirin. -High intensity statin -PT/OT/SLP eval -Normal A1c, lipid normal ago -Echocardiogram showed preserved EF.  No evidence of clot -CT angiogram head and neck no acute abnormality  Mild leukocytosis Likely reactive.  White count 13.9.  Chest x-ray without evidence of pneumonia.  UA not suggestive of infection. -Continue to monitor CBC  Paroxysmal atrial fibrillation -Currently in sinus rhythm. Continue Eliquis.  Diet: Cardiac diet DVT Prophylaxis: subcutaneous Heparin  Advance goals of care discussion: Full code  Family Communication: no family was present at bedside, at the time of interview.   Disposition:  Discharge to home.  Consultants: Neurology Procedures:  Echocardiogram   Scheduled Meds: . apixaban  5 mg Oral BID  . aspirin EC  81 mg Oral Daily  . atorvastatin  40 mg Oral q1800   Continuous Infusions: PRN Meds: acetaminophen **OR** acetaminophen (TYLENOL) oral liquid 160 mg/5 mL **OR** acetaminophen Antibiotics: Anti-infectives (From admission, onward)   None       Objective: Physical Exam: Vitals:   09/14/18 0344 09/14/18 0757 09/14/18 1152 09/14/18 1653  BP: 132/68 120/85 133/85 140/79  Pulse: (!) 57 66 78 68  Resp: 18 16 20 16   Temp: 97.9 F (36.6 C) 98.4 F (36.9 C) 97.8 F (36.6 C) (!) 97.5 F (36.4 C)  TempSrc: Oral Oral Oral Oral  SpO2: 98% 99% 100% 98%  Weight:      Height:        Intake/Output Summary (Last 24 hours) at 09/14/2018 1922 Last data filed at 09/14/2018 1500 Gross per 24 hour  Intake 480 ml  Output -  Net 480 ml   Filed Weights   09/12/18 1608  Weight: 91.2 kg   General: Alert, Awake and Oriented to Time, Place and Person. Appear in mild distress, affect appropriate Eyes: PERRL, Conjunctiva normal ENT: Oral Mucosa clear moist. Neck: no JVD, no Abnormal Mass Or lumps Cardiovascular: S1 and S2 Present, no Murmur, Peripheral Pulses Present Respiratory: normal respiratory effort, Bilateral Air entry equal and Decreased, no use of accessory muscle, Clear to Auscultation, no Crackles, no wheezes Abdomen: Bowel Sound present, Soft and no tenderness, no hernia Skin: no redness, no Rash, no induration Extremities: no Pedal edema, no calf tenderness Neurologic: Grossly no focal neuro deficit. Bilaterally Equal motor strength  Data Reviewed: CBC: Recent Labs  Lab 09/12/18 1714 09/13/18 1254 09/14/18 0458  WBC 13.1* 14.2* 10.8*  NEUTROABS  9.1* 12.4* 7.2  HGB 12.2* 12.1* 11.2*  HCT 37.3* 36.8* 34.2*  MCV 84.0 82.0 82.2  PLT 201 261 951   Basic Metabolic Panel: Recent Labs  Lab 09/12/18 1800 09/14/18 0458 09/14/18 1230  NA 140 139 138  K 4.0 3.8 3.7  CL 107 104 104  CO2 25 24 22    GLUCOSE 89 97 91  BUN 16 14 13   CREATININE 0.96 0.97 0.96  CALCIUM 8.8* 8.7* 8.8*  MG  --  2.1 2.2    Liver Function Tests: Recent Labs  Lab 09/12/18 1800 09/14/18 0458  AST 31 28  ALT 29 28  ALKPHOS 117 120  BILITOT 1.2 1.2  PROT 6.4* 5.6*  ALBUMIN 4.3 3.8   No results for input(s): LIPASE, AMYLASE in the last 168 hours. No results for input(s): AMMONIA in the last 168 hours. Coagulation Profile: Recent Labs  Lab 09/12/18 1714  INR 1.3*   Cardiac Enzymes: No results for input(s): CKTOTAL, CKMB, CKMBINDEX, TROPONINI in the last 168 hours. BNP (last 3 results) No results for input(s): PROBNP in the last 8760 hours. CBG: No results for input(s): GLUCAP in the last 168 hours. Studies: Ct Head Wo Contrast  Result Date: 09/14/2018 CLINICAL DATA:  Follow-up stroke EXAM: CT HEAD WITHOUT CONTRAST TECHNIQUE: Contiguous axial images were obtained from the base of the skull through the vertex without intravenous contrast. COMPARISON:  CT brain, 09/12/2018, MR brain, 09/13/2018 FINDINGS: Brain: No evidence of acute infarction, hemorrhage, hydrocephalus, extra-axial collection or mass lesion/mass effect. Vascular: No hyperdense vessel or unexpected calcification. Skull: Normal. Negative for fracture or focal lesion. Sinuses/Orbits: No acute finding. Other: None. IMPRESSION: No acute intracranial pathology by CT. No evidence of stroke or hemorrhage. Electronically Signed   By: Eddie Candle M.D.   On: 09/14/2018 15:26     Time spent: 35 minutes  Author: Berle Mull, MD [facility] 09/14/2018 7:22 PM  To reach On-call, see care teams to locate the attending and reach out to them via [email or web address]. If 7PM-7AM, please contact night-coverage If you still have difficulty reaching the attending provider, please page the [facility] (Director on Call) for [facility] on amion for assistance.

## 2018-09-14 NOTE — Progress Notes (Signed)
Tele reported at 12 beat run of [place]. Nurse went to check on patient, and he is now reporting the numbness and tingling in his right hand has increased to his right shoulder. Dr. Posey Pronto notified. Nurse will continue to monitor. [place]

## 2018-09-14 NOTE — Progress Notes (Signed)
Repeat CT Brain reviewed.  No hemorrhage related to Eliquis.  No new hypodensity.    Upon re-examination of the patient, he now has a rhythmic right hand > arm tremor suggestive of a focal motor seizure.  I will give him 1 mg Ativan to see if it responds.  I will do an EEG tomorrow as well.  Possibility of psychosomatization or conversion disorder exists.    Review of CT Angiogram from the past raises possibility of CNS vasculitis.  Ultimate diagnosis is by biopsy, but too early to jump to that.  I will add low dose ASA to the Eliquis in the short term.    Rogue Jury, MS, MD

## 2018-09-15 ENCOUNTER — Inpatient Hospital Stay (HOSPITAL_COMMUNITY): Payer: Managed Care, Other (non HMO)

## 2018-09-15 DIAGNOSIS — I639 Cerebral infarction, unspecified: Principal | ICD-10-CM

## 2018-09-15 LAB — CBC
HCT: 34.2 % — ABNORMAL LOW (ref 39.0–52.0)
Hemoglobin: 11.3 g/dL — ABNORMAL LOW (ref 13.0–17.0)
MCH: 27.2 pg (ref 26.0–34.0)
MCHC: 33 g/dL (ref 30.0–36.0)
MCV: 82.2 fL (ref 80.0–100.0)
Platelets: 200 10*3/uL (ref 150–400)
RBC: 4.16 MIL/uL — ABNORMAL LOW (ref 4.22–5.81)
RDW: 19.7 % — ABNORMAL HIGH (ref 11.5–15.5)
WBC: 10.9 10*3/uL — ABNORMAL HIGH (ref 4.0–10.5)
nRBC: 1.5 % — ABNORMAL HIGH (ref 0.0–0.2)

## 2018-09-15 LAB — BASIC METABOLIC PANEL
Anion gap: 10 (ref 5–15)
BUN: 14 mg/dL (ref 8–23)
CO2: 26 mmol/L (ref 22–32)
Calcium: 8.8 mg/dL — ABNORMAL LOW (ref 8.9–10.3)
Chloride: 104 mmol/L (ref 98–111)
Creatinine, Ser: 0.95 mg/dL (ref 0.61–1.24)
GFR calc Af Amer: >60 mL/min (ref >60)
GFR calc non Af Amer: >60 mL/min (ref >60)
Glucose, Bld: 89 mg/dL (ref 70–99)
Potassium: 3.9 mmol/L (ref 3.5–5.1)
Sodium: 140 mmol/L (ref 135–145)

## 2018-09-15 MED ORDER — ASPIRIN 81 MG PO TBEC: 81.0000 mg | DELAYED_RELEASE_TABLET | Freq: Every day | ORAL | 0 refills | Status: AC

## 2018-09-15 MED ORDER — ATORVASTATIN CALCIUM 40 MG PO TABS: 40.0000 mg | ORAL_TABLET | Freq: Every day | ORAL | 0 refills | Status: DC

## 2018-09-15 NOTE — Progress Notes (Signed)
STROKE TEAM PROGRESS NOTE      SUBJECTIVE (INTERVAL HISTORY) Patient is doing a lot better this morning.  Not had any involuntary movements in his hand.  Is doing well and wants to go home  OBJECTIVE Vitals:   09/14/18 2358 09/15/18 0332 09/15/18 0824 09/15/18 1249  BP: 125/78 132/77 128/83 (!) 132/91  Pulse: 65 70 67 81  Resp: 18 20 17 15   Temp: 98.3 F (36.8 C) 98.2 F (36.8 C) 97.8 F (36.6 C) 97.8 F (36.6 C)  TempSrc: Oral Oral Oral Oral  SpO2: 97% 97% 100% 98%  Weight:      Height:        CBC:  Recent Labs  Lab 09/13/18 1254 09/14/18 0458 09/15/18 0558  WBC 14.2* 10.8* 10.9*  NEUTROABS 12.4* 7.2  --   HGB 12.1* 11.2* 11.3*  HCT 36.8* 34.2* 34.2*  MCV 82.0 82.2 82.2  PLT 261 207 099    Basic Metabolic Panel:  Recent Labs  Lab 09/14/18 0458 09/14/18 1230 09/15/18 0558  NA 139 138 140  K 3.8 3.7 3.9  CL 104 104 104  CO2 24 22 26   GLUCOSE 97 91 89  BUN 14 13 14   CREATININE 0.97 0.96 0.95  CALCIUM 8.7* 8.8* 8.8*  MG 2.1 2.2  --     Lipid Panel:     Component Value Date/Time   CHOL 142 09/13/2018 0634   TRIG 203 (H) 09/13/2018 0634   HDL 25 (L) 09/13/2018 0634   CHOLHDL 5.7 09/13/2018 0634   VLDL 41 (H) 09/13/2018 0634   LDLCALC 76 09/13/2018 0634   HgbA1c:  Lab Results  Component Value Date   HGBA1C 4.8 09/13/2018   Urine Drug Screen:     Component Value Date/Time   LABOPIA NONE DETECTED 09/12/2018 1723   COCAINSCRNUR NONE DETECTED 09/12/2018 1723   LABBENZ NONE DETECTED 09/12/2018 1723   AMPHETMU NONE DETECTED 09/12/2018 1723   THCU NONE DETECTED 09/12/2018 1723   LABBARB NONE DETECTED 09/12/2018 1723    Alcohol Level     Component Value Date/Time   ETH <10 09/12/2018 1714    IMAGING  [name] Contrast 09/13/2018 IMPRESSION:  1. Subcentimeter acute/early subacute infarction within the cortex of left superior precentral gyrus. No associated hemorrhage or mass effect.  2. Increased susceptibility signal appears associated  with blood vessel in the left posterior sylvian fissure of uncertain significance, possibly hyperemia associated with recent ischemia given adjacent infarction. No vascular malformation is present this location prior MRA of the head. Multiple thromboemboli or subarachnoid hemorrhage is considered unlikely in the absence of secondary findings.  3. Mild chronic microvascular ischemic changes of the brain. Very small chronic infarction in the right cerebellar hemisphere.    Ct Angio Head W Or Wo Contrast Ct Angio Neck W Or Wo Contrast  09/13/2018 IMPRESSION:  1. Left-greater-than-right cervical ICA ectasia measuring up to 9 mm diameter on the left. Left upper ICA small posterior directed sacculation/penetrating ulcer at C2 level. Multiple segments of stenosis in the intracranial circulation including a beaded appearance in the small vessels of the anterior circulation. Relatively mild atherosclerotic disease. 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  2. No dissection, large vessel occlusion, aneurysm, or vascular malformation identified. No high-grade stenosis in the neck.    Ct Head Wo Contrast 09/12/2018 IMPRESSION:  Mild atrophic changes without acute abnormality.    Dg Chest 2 View 09/12/2018 IMPRESSION:  No active cardiopulmonary disease.  Transthoracic Echocardiogram  Normal ejection fraction of 55 to 60%.  No cardiac source of embolism.   EKG - SB rate 58 BPM. (See cardiology reading for complete details)    PHYSICAL EXAM Blood pressure (!) 132/91, pulse 81, temperature 97.8 F (36.6 C), temperature source Oral, resp. rate 15, height 5\' 11"  (1.803 m), weight 91.2 kg, SpO2 98 %.  Neurological Exam ;  Awake  Alert oriented x 3. Normal speech and language.eye movements full without nystagmus.fundi were not visualized. Vision acuity and fields appear normal. Hearing is normal. Palatal movements are  normal. Face symmetric. Tongue midline. Normal strength, tone, reflexes and coordination. Normal sensation. Gait deferred.      ASSESSMENT/PLAN [name] with history of polycythemia, hypertension, PVCs, brief PAT and paroxysmal atrial fibrillation seen on monitor in February 2020-on Eliquis,  presenting with right upper and lower extremity paresthesias.  He did not receive IV t-PA due to anticoagulation and late presentation (>4.5 hours from time of onset).  Stroke:  Probable cardioembolic  Resultant  Right numbness and weakness, with latter resolved.  CT head - Mild atrophic changes without acute abnormality.   MRI head - Subcentimeter acute/early subacute infarction within the cortex of left superior precentral gyrus.  Very small chronic infarction in the right cerebellar hemisphere.   MRA head - not performed  CTA H&N - Left upper ICA small posterior directed sacculation/penetrating ulcer at C2 level. 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  2D Echo - normal - No indication for TEE as we already know he has PAF and he is on anticoagulation already.  LDL - 76  HgbA1c - 4.8  UDS  - negative  VTE prophylaxis - Eliquis  Diet  - Heart healthy with thin liquids.  Eliquis (apixaban) daily prior to admission, now on Eliquis (apixaban) daily- He has had a stroke on Eliquis.  Studies have shown essentially that Eliquis is likely to be superior to Xarelto and is superior to Coumadin for secondary stroke prevention.   Therefore, may not make sense to switch him to those.  We can add ASA 81 mg qd to his secondary stroke regimen, however, we are risking increased risk of hemorrhage.  Cardiologist can weigh in on whether he feels comfortable with  adding this.    Hypertension  High SBP today . Permissive hypertension (OK if < 220/120) but gradually normalize in 5-7 days . Long-term BP goal normotensive  Hyperlipidemia  Lipid lowering medication PTA:  none  LDL 76, goal < 70  Current lipid lowering medication: Lipitor 40 mg daily  Continue statin at discharge  Other Stroke Risk Factors  Advanced age  ETOH use, advised to drink no more than 1 alcoholic beverage per day  Overweight. Body mass index is 28.03 kg/m., recommend weight loss, diet and exercise as appropriate   Hx stroke/TIA by imaging  Atrial fibrillation   Patient seems to be doing better it is unclear as to what the right upper extremity involuntary movements yesterday contributed.  Continue Eliquis.  He can be discharged home to follow-up as an outpatient with stroke clinic.     Antony Contras, MD  To contact Stroke Continuity provider, please refer to [email or web address] After hours, contact General Neurology

## 2018-09-15 NOTE — Evaluation (Signed)
Speech Language Pathology Evaluation Patient Details Name: [name] [name] MRN: [id number] DOB: [birth year] Today's Date: 09/15/2018 Time: 3532-9924 SLP Time Calculation (min) (ACUTE ONLY): 35 min  Problem List:  Patient Active Problem List   Diagnosis Date Noted  . Acute CVA (cerebrovascular accident) ([place]) 09/13/2018  . Leukocytosis 09/13/2018  . AF (paroxysmal atrial fibrillation) ([place]) 09/13/2018  . Iron deficiency anemia due to chronic blood loss 09/26/2017  . PALPITATIONS 09/14/2009  . CHEST PAIN 09/09/2009  . Constipation 06/08/2008  . RECTAL BLEEDING 06/08/2008  . DYSLIPIDEMIA 06/03/2008  . POLYCYTHEMIA 06/03/2008  . DIVERTICULOSIS OF COLON 06/03/2008   Past Medical History:  Past Medical History:  Diagnosis Date  . Diverticulosis of colon (without mention of hemorrhage)   . Hemorrhoids   . Hypertension   . IBS (irritable bowel syndrome)   . Iron deficiency anemia due to chronic blood loss 09/26/2017  . Irregular heartbeat   . Nephrolithiasis   . Other and unspecified hyperlipidemia   . Perirectal fistula   . Polycythemia, secondary   . Stye    blocker duct right eye lid   Past Surgical History:  Past Surgical History:  Procedure Laterality Date  . RETINAL DETACHMENT SURGERY    . ROTATOR CUFF REPAIR     rt.  Marland Kitchen VASECTOMY     HPI:  63 year old male admitted 09/12/2018 with weakness. PMH: HTN, PAFib, polycythemia, HLD.  MRI = subcentimeter acute/early subacute infarction within the cortex of the left superior precentral gyrus.    Assessment / Plan / Recommendation Clinical Impression  The Montreal Cognitive Assessment (MoCA) was administered to evaluate cognitive linguistic function. Pt scored 30/30, indicating normal cognitive function for pt age and level of education. Pt reported intermittent slurred speech, however, no dysarthria was noted during this assessment. CN exam unremarkable, including intact facial sensation, at this time. Pt also reports intermittent  stuttering episodes, which he attributes to work related stress. No oral or verbal apraxia was noted today.  Pt was encouraged to notify PCP if motor speech difficulties increase in frequency or duration. No further ST intervention is recommended at this time.     SLP Assessment  SLP Recommendation/Assessment: Patient does not need any further Speech Language Pathology Services    Follow Up Recommendations  None       SLP Evaluation Cognition  Overall Cognitive Status: Within Functional Limits for tasks assessed Arousal/Alertness: Awake/alert Orientation Level: Oriented X4 Attention: Focused;Sustained;Selective Focused Attention: Appears intact Sustained Attention: Appears intact Selective Attention: Appears intact Memory: Appears intact Awareness: Appears intact Problem Solving: Appears intact Executive Function: Reasoning;Organizing Reasoning: Appears intact Organizing: Appears intact Safety/Judgment: Appears intact       Comprehension  Auditory Comprehension Overall Auditory Comprehension: Appears within functional limits for tasks assessed    Expression Expression Primary Mode of Expression: Verbal Verbal Expression Overall Verbal Expression: Appears within functional limits for tasks assessed Written Expression Dominant Hand: Left   Oral / Motor  Oral Motor/Sensory Function Overall Oral Motor/Sensory Function: Within functional limits Motor Speech Overall Motor Speech: Appears within functional limits for tasks assessed   GO                   Celia B. Quentin Ore [facility], CCC-SLP Speech Language Pathologist [phone]  Shonna Chock 09/15/2018, 12:30 PM

## 2018-09-15 NOTE — Procedures (Signed)
History: 63 year old male being evaluated for seizures  Sedation: None  Technique: This is a 21 channel routine scalp EEG performed at the bedside with bipolar and monopolar montages arranged in accordance to the international 10/20 system of electrode placement. One channel was dedicated to EKG recording.    Background: The background consists of intermixed alpha and beta activities. There is a well defined posterior dominant rhythm of 10 hz that attenuates with eye opening. Sleep is recorded with normal appearing structures.   Photic stimulation: Physiologic driving is not performed  EEG Abnormalities: None  Clinical Interpretation: This normal EEG is recorded in the waking and sleep state. There was no seizure or seizure predisposition recorded on this study. Please note that lack of epileptiform activity on EEG does not preclude the possibility of epilepsy.   Roland Rack, MD [facility] [phone]  If 7pm- 7am, please page neurology on call as listed in [place].

## 2018-09-15 NOTE — Progress Notes (Signed)
Pt discharging home today. Pt has insurance, PCP f/u and transportation home.

## 2018-09-15 NOTE — Progress Notes (Signed)
EEG completed, results pending. 

## 2018-09-15 NOTE — Progress Notes (Signed)
DC instructions given to patient, verbalized understanding. Pt.'s transportation will be here ~1630-1700.

## 2018-09-16 LAB — ANTINUCLEAR ANTIBODIES, IFA: ANA Ab, IFA: NEGATIVE

## 2018-09-16 LAB — MPO/PR-3 (ANCA) ANTIBODIES
ANCA Proteinase 3: <3.5 U/mL (ref 0.0–3.5)
Myeloperoxidase Abs: <9 U/mL (ref 0.0–9.0)

## 2018-09-18 LAB — CULTURE, BLOOD (ROUTINE X 2)
Culture: NO GROWTH
Culture: NO GROWTH
Special Requests: ADEQUATE
Special Requests: ADEQUATE

## 2018-09-18 NOTE — Discharge Summary (Signed)
[facility] Discharge Summary   Patient: [name] [name] [id number]:[id number]   PCP: Leanna Battles, MD DOB: [birth year]   Date of admission: 09/12/2018   Date of discharge: 09/15/2018     Discharge Diagnoses:  Principal Problem:   Acute CVA (cerebrovascular accident) [facility]) Active Problems:   Leukocytosis   AF (paroxysmal atrial fibrillation) ([place])   Admitted From: home Disposition:  home  Recommendations for Outpatient Follow-up:  1. Please follow up with PCP in 1 week   Follow-up Information    Leanna Battles, MD. Schedule an appointment as soon as possible for a visit in 1 week(s).   Specialty:  Internal Medicine Contact information: [address] [place] [address] [phone]          Diet recommendation: cardia cidet  Activity: The patient is advised to gradually reintroduce usual activities.  Discharge Condition: good  Code Status: full coe  History of present illness: As per the H and P dictated on admission, "[name] [name] is a 63 y.o. male with medical history significant of hypertension, paroxysmal A. fib on Eliquis, polycythemia, and conditions listed below presenting to the hospital for evaluation of weakness.  Patient states he had weakness in his right leg a week ago.  He describes it as "a rubbery feeling."  States this started in the evening and when he woke up the next in the morning symptoms had resolved.  Then yesterday he experienced weakness in his right arm which lasted a few hours and he also had numbness and tingling in his right hand and fingers which lasted 5 minutes.  At present, he is not having any weakness, numbness, or tingling.  No changes in his vision.  Denies history of prior stroke.  He does not smoke cigarettes.  ED Course: Blood pressure 152/90, remainder of vitals stable on arrival.  White count 13.1.  Hemoglobin 12.2, no significant change since recent labs.  Blood ethanol level less than 10.  UDS negative.  INR 1.3.  UA not  suggestive of infection.  COVID-19 rapid test negative.  Chest x-ray showing no active cardiopulmonary disease.  Head CT negative for acute finding.  Brain MRI showing subcentimeter acute/early subacute infarction within the cortex of the left superior precentral gyrus.  No associated hemorrhage or mass-effect."  Hospital Course:  Summary of his active problems in the hospital is as following. Acute ischemic stroke Presenting with complaints ofRLEweakness a week ago, RUE weaknessa day ago along with paresthesias in his right hand. No deficits at present. Brain MRI showing subcentimeter acute/early subacute infarction within the cortex of the left superior precentral gyrus. No associated hemorrhage or mass-effect. -Neurology following, appreciate recommendations Continue Eliquis, discussed with neurology as well as cardiology.  Okay to start the patient on Eliquis as well as aspirin. High intensity statin -PT/OT/SLP eval recommends no PT follow up needed  -Normal A1c, lipid normal ago -Echocardiogram showed preserved EF.  No evidence of clot -CT angiogram head and neck no acute abnormality  Mild leukocytosis Likely reactive. White count 13.9. Chest x-ray without evidence of pneumonia. UA not suggestive of infection. Cultures negative   Paroxysmal atrial fibrillation Currently in sinus rhythm. Continue Eliquis.  Right upper extremity involuntary movements unclear as to what the right upper extremity involuntary movements yesterday contributed.  EEG normal Currently resolved.   Patient was seen by physical therapy, who recommended no PT follow up needed On the day of the discharge the patient's vitals were stable , and no other acute medical condition were reported by patient.  the patient was felt safe to be discharge at home with family.  Consultants: neurology Procedures: Echocardiogram EEG  DISCHARGE MEDICATION: Allergies as of 09/15/2018   No Known Allergies       Medication List    STOP taking these medications   ibuprofen 200 MG tablet Commonly known as:  ADVIL     TAKE these medications   acetaminophen 500 MG tablet Commonly known as:  TYLENOL Take 1,000 mg by mouth every 6 (six) hours as needed for mild pain.   anagrelide 1 MG capsule Commonly known as:  AGRYLIN TAKE 1 CAPSULE (1 MG TOTAL) BY MOUTH 2 (TWO) TIMES DAILY.   apixaban 5 MG Tabs tablet Commonly known as:  ELIQUIS Take 1 tablet (5 mg total) by mouth 2 (two) times daily.   aspirin 81 MG EC tablet Take 1 tablet (81 mg total) by mouth daily.   atorvastatin 40 MG tablet Commonly known as:  LIPITOR Take 1 tablet (40 mg total) by mouth daily at 6 PM.   buPROPion 150 MG 12 hr tablet Commonly known as:  WELLBUTRIN SR Take 150 mg by mouth every morning.   fluticasone 50 MCG/ACT nasal spray Commonly known as:  FLONASE Place 1 spray into both nostrils daily as needed for allergies.   propranolol ER 160 MG SR capsule Commonly known as:  INDERAL LA Take 1 capsule (160 mg total) by mouth daily.   zolpidem 12.5 MG CR tablet Commonly known as:  AMBIEN CR Take 6.25 mg by mouth at bedtime.      No Known Allergies Discharge Instructions    Ambulatory referral to Neurology   Complete by:  As directed    An appointment is requested in approximately: 4 weeks   Diet - low sodium heart healthy   Complete by:  As directed    Discharge instructions   Complete by:  As directed    It is important that you read the given instructions as well as go over your medication list with RN to help you understand your care after this hospitalization.  Discharge Instructions: Please follow-up with PCP in 1-2 weeks  Please request your primary care physician to go over all Hospital Tests and Procedure/Radiological results at the follow up. Please get all Hospital records sent to your PCP by signing hospital release before you go home.   Do not drive, operating heavy machinery, perform  activities at heights, swimming or participation in water activities or provide baby sitting services; until you have been seen by Primary Care Physician or a Neurologist and advised to do so again. Do not take more than prescribed Pain, Sleep and Anxiety Medications. You were cared for by a hospitalist during your hospital stay. If you have any questions about your discharge medications or the care you received while you were in the hospital after you are discharged, you can call the unit @UNIT @ you were admitted to and ask to speak with the hospitalist on call if the hospitalist that took care of you is not available.  Once you are discharged, your primary care physician will handle any further medical issues. Please note that NO REFILLS for any discharge medications will be authorized once you are discharged, as it is imperative that you return to your primary care physician (or establish a relationship with a primary care physician if you do not have one) for your aftercare needs so that they can reassess your need for medications and monitor your lab values. You Must read complete instructions/literature  along with all the possible adverse reactions/side effects for all the Medicines you take and that have been prescribed to you. Take any new Medicines after you have completely understood and accept all the possible adverse reactions/side effects. Wear Seat belts while driving. If you have smoked or chewed Tobacco in the last 2 yrs please stop smoking and/or stop any Recreational drug use.  If you drink alcohol, please stop the use and do not drive, operating heavy machinery, perform activities at heights, swimming or participation in water activities or provide baby sitting services under influence.   Increase activity slowly   Complete by:  As directed      Discharge Exam: Filed Weights   09/12/18 1608  Weight: 91.2 kg   Vitals:   09/15/18 0824 09/15/18 1249  BP: 128/83 (!) 132/91  Pulse:  67 81  Resp: 17 15  Temp: 97.8 F (36.6 C) 97.8 F (36.6 C)  SpO2: 100% 98%   General: Appear in no distress, no Rash; Oral Mucosa moist. Cardiovascular: S1 and S2 Present, no Murmur, no JVD Respiratory: Bilateral Air entry present and Clear to Auscultation, no Crackles, no wheezes Abdomen: Bowel Sound present, Soft and no tenderness Extremities: no Pedal edema, no calf tenderness Neurology: Grossly no focal neuro deficit.  The results of significant diagnostics from this hospitalization (including imaging, microbiology, ancillary and laboratory) are listed below for reference.    Significant Diagnostic Studies: Ct Angio Head W Or Wo Contrast  Result Date: 09/13/2018 CLINICAL DATA:  63 y/o  M; stroke for follow-up. EXAM: CT ANGIOGRAPHY HEAD AND NECK TECHNIQUE: Multidetector CT imaging of the head and neck was performed using the standard protocol during bolus administration of intravenous contrast. Multiplanar CT image reconstructions and MIPs were obtained to evaluate the vascular anatomy. Carotid stenosis measurements (when applicable) are obtained utilizing NASCET criteria, using the distal internal carotid diameter as the denominator. CONTRAST:  153mL OMNIPAQUE IOHEXOL 350 MG/ML SOLN COMPARISON:  09/12/2018 CT head.  09/13/2018 MRI head. FINDINGS: CTA NECK FINDINGS Aortic arch: Standard branching. Imaged portion shows no evidence of aneurysm or dissection. No significant stenosis of the major arch vessel origins. Right carotid system: No evidence of dissection, stenosis (50% or greater) or occlusion. Mild ectasia of the mid cervical ICA. Left carotid system: No evidence of dissection, stenosis (50% or greater) or occlusion. 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 There is lobulation to the lumen of the ICA and a focal posteriorly directed sacculation/penetrating ulcer at the C2 level which measures 3 mm in depth  (series 10, image 127). Vertebral arteries: Right dominant. No evidence of dissection, stenosis (50% or greater) or occlusion. Skeleton: Negative. Other neck: Negative. Upper chest: Negative. Review of the MIP images confirms the above findings CTA HEAD FINDINGS Anterior circulation: Moderate right A1 and right M2 origin stenosis. Multiple areas of mild stenosis in the bilateral M2 segments. No additional scratch proximal high-grade stenosis, large vessel occlusion, aneurysm, or vascular malformation. There is a beaded appearance with multiple segments of stenosis in both the MCA and ACA distributions involving small vessels. Posterior circulation: No significant stenosis, proximal occlusion, aneurysm, or vascular malformation. Venous sinuses: As permitted by contrast timing, patent. Anatomic variants: None significant. Delayed phase: No abnormal intracranial enhancement. Review of the MIP images confirms the above findings IMPRESSION: 1. Left-greater-than-right cervical ICA ectasia measuring up to 9 mm diameter on the left. Left upper ICA small posterior directed sacculation/penetrating  ulcer at C2 level. Multiple segments of stenosis in the intracranial circulation including a beaded appearance in the small vessels of the anterior circulation. Relatively mild atherosclerotic disease. 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 2. No dissection, large vessel occlusion, aneurysm, or vascular malformation identified. No high-grade stenosis in the neck. Electronically Signed   By: Kristine Garbe M.D.   On: 09/13/2018 05:57   Dg Chest 2 View  Result Date: 09/12/2018 CLINICAL DATA:  Numbness EXAM: CHEST - 2 VIEW COMPARISON:  05/14/2014 FINDINGS: The heart size and mediastinal contours are within normal limits. Both lungs are clear. The visualized skeletal structures are unremarkable. Postsurgical changes in the right shoulder  are again seen. IMPRESSION: No active cardiopulmonary disease. Electronically Signed   By: Inez Catalina M.D.   On: 09/12/2018 17:47   Ct Head Wo Contrast  Result Date: 09/14/2018 CLINICAL DATA:  Follow-up stroke EXAM: CT HEAD WITHOUT CONTRAST TECHNIQUE: Contiguous axial images were obtained from the base of the skull through the vertex without intravenous contrast. COMPARISON:  CT brain, 09/12/2018, MR brain, 09/13/2018 FINDINGS: Brain: No evidence of acute infarction, hemorrhage, hydrocephalus, extra-axial collection or mass lesion/mass effect. Vascular: No hyperdense vessel or unexpected calcification. Skull: Normal. Negative for fracture or focal lesion. Sinuses/Orbits: No acute finding. Other: None. IMPRESSION: No acute intracranial pathology by CT. No evidence of stroke or hemorrhage. Electronically Signed   By: Eddie Candle M.D.   On: 09/14/2018 15:26   Ct Head Wo Contrast  Result Date: 09/12/2018 CLINICAL DATA:  Right leg tingling for several days, headaches, initial encounter EXAM: CT HEAD WITHOUT CONTRAST TECHNIQUE: Contiguous axial images were obtained from the base of the skull through the vertex without intravenous contrast. COMPARISON:  None. FINDINGS: Brain: No evidence of acute infarction, hemorrhage, hydrocephalus, extra-axial collection or mass lesion/mass effect. Mild atrophic changes are noted. Vascular: No hyperdense vessel or unexpected calcification. Skull: Normal. Negative for fracture or focal lesion. Sinuses/Orbits: No acute finding. Other: None. IMPRESSION: Mild atrophic changes without acute abnormality. Electronically Signed   By: Inez Catalina M.D.   On: 09/12/2018 17:45   Ct Angio Neck W Or Wo Contrast  Result Date: 09/13/2018 CLINICAL DATA:  63 y/o  M; stroke for follow-up. EXAM: CT ANGIOGRAPHY HEAD AND NECK TECHNIQUE: Multidetector CT imaging of the head and neck was performed using the standard protocol during bolus administration of intravenous contrast. Multiplanar CT  image reconstructions and MIPs were obtained to evaluate the vascular anatomy. Carotid stenosis measurements (when applicable) are obtained utilizing NASCET criteria, using the distal internal carotid diameter as the denominator. CONTRAST:  153mL OMNIPAQUE IOHEXOL 350 MG/ML SOLN COMPARISON:  09/12/2018 CT head.  09/13/2018 MRI head. FINDINGS: CTA NECK FINDINGS Aortic arch: Standard branching. Imaged portion shows no evidence of aneurysm or dissection. No significant stenosis of the major arch vessel origins. Right carotid system: No evidence of dissection, stenosis (50% or greater) or occlusion. Mild ectasia of the mid cervical ICA. Left carotid system: No evidence of dissection, stenosis (50% or greater) or occlusion. 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 There is lobulation to the lumen of the ICA and a focal posteriorly directed sacculation/penetrating ulcer at the C2 level which measures 3 mm in depth (series 10, image 127). Vertebral arteries: Right dominant. No evidence of dissection, stenosis (50% or greater) or occlusion. Skeleton: Negative. Other neck: Negative. Upper chest: Negative. Review  of the MIP images confirms the above findings CTA HEAD FINDINGS Anterior circulation: Moderate right A1 and right M2 origin stenosis. Multiple areas of mild stenosis in the bilateral M2 segments. No additional scratch proximal high-grade stenosis, large vessel occlusion, aneurysm, or vascular malformation. There is a beaded appearance with multiple segments of stenosis in both the MCA and ACA distributions involving small vessels. Posterior circulation: No significant stenosis, proximal occlusion, aneurysm, or vascular malformation. Venous sinuses: As permitted by contrast timing, patent. Anatomic variants: None significant. Delayed phase: No abnormal intracranial enhancement. Review of the MIP images confirms the above findings IMPRESSION:  1. Left-greater-than-right cervical ICA ectasia measuring up to 9 mm diameter on the left. Left upper ICA small posterior directed sacculation/penetrating ulcer at C2 level. Multiple segments of stenosis in the intracranial circulation including a beaded appearance in the small vessels of the anterior circulation. Relatively mild atherosclerotic disease. 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 2. No dissection, large vessel occlusion, aneurysm, or vascular malformation identified. No high-grade stenosis in the neck. Electronically Signed   By: Kristine Garbe M.D.   On: 09/13/2018 05:57   [name] Contrast  Result Date: 09/13/2018 CLINICAL DATA:  63 y/o M; multiple episodes of weakness and feeling off balance since 09/07/2018. Focal neuro deficit, > 6 hrs, stroke suspected. EXAM: MRI HEAD WITHOUT CONTRAST TECHNIQUE: Multiplanar, multiecho pulse sequences of the brain and surrounding structures were obtained without intravenous contrast. COMPARISON:  09/12/2018 CT head. 01/19/2017 MRI head. 01/19/2017 MRA of the head. FINDINGS: Brain: Subcentimeter focus of reduced diffusion within the cortex of left superior precentral gyrus (series 5, image 85) compatible with acute/early subacute infarction. No associated hemorrhage or mass effect. No additional findings for stroke, hemorrhage, extra-axial collection, hydrocephalus, mass effect, or herniation. There is a very small chronic infarction within the right cerebellar hemisphere. Few punctate nonspecific T2 FLAIR hyperintensities in subcortical white matter are compatible with mild chronic microvascular ischemic changes. Mild volume loss of the brain. Vascular: Curvilinear interrupted increased susceptibility hypointensity, likely associated with vessels in the left posterior sylvian fissure (series 4, image 35). No signal abnormality on additional sequences or prior CT.  Skull and upper cervical spine: Normal marrow signal. Sinuses/Orbits: Negative. Other: None. IMPRESSION: 1. Subcentimeter acute/early subacute infarction within the cortex of left superior precentral gyrus. No associated hemorrhage or mass effect. 2. Increased susceptibility signal appears associated with blood vessel in the left posterior sylvian fissure of uncertain significance, possibly hyperemia associated with recent ischemia given adjacent infarction. No vascular malformation is present this location prior MRA of the head. Multiple thromboemboli or subarachnoid hemorrhage is considered unlikely in the absence of secondary findings. 3. Mild chronic microvascular ischemic changes of the brain. Very small chronic infarction in the right cerebellar hemisphere. These results were called by telephone at the time of interpretation on 09/13/2018 at 1:37 am to Dr. Pryor Curia , who verbally acknowledged these results. Electronically Signed   By: Kristine Garbe M.D.   On: 09/13/2018 01:45   US Abdomen Complete  Result Date: 09/11/2018 CLINICAL DATA:  Polycythemia, assess for splenomegaly EXAM: ABDOMEN ULTRASOUND COMPLETE COMPARISON:  CT abdomen/pelvis dated 08/27/2018. FINDINGS: Gallbladder: Adherent gallstones vs gallbladder polyps measuring up to 5 mm. No sonographic Murphy sign noted by sonographer. Common bile duct: Diameter: 4 mm Liver: Within the upper limits of normal for parenchymal echogenicity. No focal hepatic lesion is seen. Portal vein is patent on color Doppler imaging with normal direction of blood  flow towards the liver. IVC: No abnormality visualized. Pancreas: Visualized portion unremarkable. Spleen: Enlarged, measuring 22.2 x 8.0 x 22.0 cm (calculated volume 2037 mL). Right Kidney: Length: 10.7 mL. Echogenicity within normal limits. No mass or hydronephrosis visualized. Left Kidney: Length: 11.7 mL. Echogenicity within normal limits. 1.9 x 1.7 x 2.0 cm mildly irregular cysts without solid  component. No hydronephrosis. Abdominal aorta: No aneurysm visualized. Other findings: None. IMPRESSION: Splenomegaly, measuring 22.2 cm (calculated volume 2037 mL), as above. Electronically Signed   By: Julian Hy M.D.   On: 09/11/2018 10:37    Microbiology: Recent Results (from the past 240 hour(s))  SARS Coronavirus 2 (Hosp order,Performed in [facility] lab via [employer] [facility])     Status: None   Collection Time: 09/12/18  5:45 PM  Result Value Ref Range Status   SARS Coronavirus 2 ([employer] Now) NEGATIVE NEGATIVE Final    Comment: (NOTE) Interpretive Result Comment(s): COVID 19 Positive SARS CoV 2 target nucleic acids are DETECTED. The SARS CoV 2 RNA is generally detectable in upper and lower respiratory specimens during the acute phase of infection.  Positive results are indicative of active infection with SARS CoV 2.  Clinical correlation with patient history and other diagnostic information is necessary to determine patient infection status.  Positive results do not rule out bacterial infection or coinfection with other viruses. The expected result is Negative. COVID 19 Negative SARS CoV 2 target nucleic acids are NOT DETECTED. The SARS CoV 2 RNA is generally detectable in upper and lower respiratory specimens during the acute phase of infection.  Negative results do not preclude SARS CoV 2 infection, do not rule out coinfections with other pathogens, and should not be used as the sole basis for treatment or other patient management decisions.  Negative results must be combined with clinical  observations, patient history, and epidemiological information. The expected result is Negative. Invalid Presence or absence of SARS CoV 2 nucleic acids cannot be determined. Repeat testing was performed on the submitted specimen and repeated Invalid results were obtained.  If clinically indicated, additional testing on a new specimen with an alternate test methodology [id number]) is  advised.  The SARS CoV 2 RNA is generally detectable in upper and lower respiratory specimens during the acute phase of infection. The expected result is Negative. Fact Sheet for Patients:  GolfingFamily.no Fact Sheet for Healthcare Providers: https://www.hernandez-brewer.com/ This test is not yet approved or cleared by the [place] FDA and has been authorized for detection and/or diagnosis of SARS CoV 2 by FDA under an Emergency Use Authorization (EUA).  This EUA will remain in effect (meaning this test can be used) for the duration of the COVID19 d eclaration under Section 564(b)(1) of the Act, 21 U.S.C. section [phone] 3(b)(1), unless the authorization is terminated or revoked sooner. Performed at [facility], [address] [place], [place] [address]   Culture, blood (routine x 2)     Status: None (Preliminary result)   Collection Time: 09/13/18 12:36 PM  Result Value Ref Range Status   Specimen Description BLOOD LEFT HAND  Final   Special Requests   Final    BOTTLES DRAWN AEROBIC AND ANAEROBIC Blood Culture adequate volume   Culture   Final    NO GROWTH 4 DAYS Performed at [facility], [address], [place], [place] [address]    Report Status PENDING  Incomplete  Culture, blood (routine x 2)     Status: None (Preliminary result)  Collection Time: 09/13/18 12:46 PM  Result Value Ref Range Status   Specimen Description BLOOD RIGHT ANTECUBITAL  Final   Special Requests AEROBIC BOTTLE ONLY Blood Culture adequate volume  Final   Culture   Final    NO GROWTH 4 DAYS Performed at [facility], [address] [place], [place] [address]    Report Status PENDING  Incomplete     Labs: CBC: Recent Labs  Lab 09/12/18 1714 09/13/18 1254 09/14/18 0458 09/15/18 0558  WBC 13.1* 14.2* 10.8* 10.9*  NEUTROABS 9.1* 12.4* 7.2  --   HGB 12.2* 12.1* 11.2* 11.3*  HCT 37.3* 36.8* 34.2* 34.2*  MCV 84.0 82.0 82.2 82.2    PLT 201 261 207 496   Basic Metabolic Panel: Recent Labs  Lab 09/12/18 1800 09/14/18 0458 09/14/18 1230 09/15/18 0558  NA 140 139 138 140  K 4.0 3.8 3.7 3.9  CL 107 104 104 104  CO2 25 24 22 26   GLUCOSE 89 97 91 89  BUN 16 14 13 14   CREATININE 0.96 0.97 0.96 0.95  CALCIUM 8.8* 8.7* 8.8* 8.8*  MG  --  2.1 2.2  --    Liver Function Tests: Recent Labs  Lab 09/12/18 1800 09/14/18 0458  AST 31 28  ALT 29 28  ALKPHOS 117 120  BILITOT 1.2 1.2  PROT 6.4* 5.6*  ALBUMIN 4.3 3.8   No results for input(s): LIPASE, AMYLASE in the last 168 hours. No results for input(s): AMMONIA in the last 168 hours. Cardiac Enzymes: No results for input(s): CKTOTAL, CKMB, CKMBINDEX, TROPONINI in the last 168 hours. BNP (last 3 results) No results for input(s): BNP in the last 8760 hours. CBG: No results for input(s): GLUCAP in the last 168 hours. Time spent: 35 minutes  Signed:  Berle Mull  [facility] 09/15/2018

## 2018-09-19 ENCOUNTER — Other Ambulatory Visit: Payer: Self-pay | Admitting: *Deleted

## 2018-09-19 MED ORDER — HYDROXYUREA 500 MG PO CAPS: 500.0000 mg | ORAL_CAPSULE | Freq: Two times a day (BID) | ORAL | 2 refills | Status: DC

## 2018-09-22 ENCOUNTER — Telehealth: Payer: Self-pay | Admitting: Cardiology

## 2018-09-22 ENCOUNTER — Telehealth: Payer: Self-pay | Admitting: *Deleted

## 2018-09-22 NOTE — Telephone Encounter (Signed)
Patient had a mild stroke 10 days and would like to see Dr Stanford Breed before his 10/22/2018 that has already been scheduled.  Please call patient to discuss [phone]

## 2018-09-22 NOTE — Telephone Encounter (Signed)
Call received from patient to clarify appt times with Dr. Marin Olp and to also question Hydrea prescription available at [facility] for him.  Pt notified that there is a back order of Anagrelide until July per [facility] and that [facility] requested an alternative to [facility]rel[facility].  Pt states that he has approx 3 weeks left of Anagrelide and will continue to take those daily as instructed by Dr. Marin Olp until his appt on 09/30/18.  Pt has no further questions or concerns at this time.

## 2018-09-22 NOTE — Telephone Encounter (Signed)
Spoke with pt, video visit scheduled.

## 2018-09-23 NOTE — Progress Notes (Signed)
Virtual Visit via Video Note   This visit type was conducted due to national recommendations for restrictions regarding the COVID-19 Pandemic (e.g. social distancing) in an effort to limit this patient's exposure and mitigate transmission in our community.  Due to his co-morbid illnesses, this patient is at least at moderate risk for complications without adequate follow up.  This format is felt to be most appropriate for this patient at this time.  All issues noted in this document were discussed and addressed.  A limited physical exam was performed with this format.  Please refer to the patient's chart for his consent to telehealth for [facility].   Date:  09/24/2018   ID:  Abbe Amsterdam, DOB [birth year], MRN [id number]  Patient Location: Home Provider Location: Home  PCP:  Leanna Battles, MD  Cardiologist:  Dr Stanford Breed  Evaluation Performed:  Follow-Up Visit  Chief Complaint:  FU atrial fibrillation  History of Present Illness:    FU PAF. Stress echocardiogram June 2011 normal. TSH at that time also normal. CardioNet revealed sinus rhythm with occasional PACs and PVCs. Monitor February 2020 revealed sinus bradycardia, NSR, sinus tach, pacs, PVC, brief PAT and PAF.   Echo May 2020 showed normal LV function, mild diastolic dysfunction, mild left ventricular hypertrophy and negative saline microcavitation study.   Admitted with CVA May 2020.  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  Aspirin added to apixaban by neurology as CVA occurred on apixaban.  Since last seen patient denies dyspnea, chest pain or syncope.  No bleeding. Rare brief palpitations.  The patient does not have symptoms concerning for COVID-19 infection (fever, chills, cough, or new shortness of breath).    Past Medical History:  Diagnosis  Date  . Diverticulosis of colon (without mention of hemorrhage)   . Hemorrhoids   . Hypertension   . IBS (irritable bowel syndrome)   . Iron deficiency anemia due to chronic blood loss 09/26/2017  . Irregular heartbeat   . Nephrolithiasis   . Other and unspecified hyperlipidemia   . Perirectal fistula   . Polycythemia, secondary   . Stye    blocker duct right eye lid   Past Surgical History:  Procedure Laterality Date  . RETINAL DETACHMENT SURGERY    . ROTATOR CUFF REPAIR     rt.  Marland Kitchen VASECTOMY       Current Meds  Medication Sig  . acetaminophen (TYLENOL) 500 MG tablet Take 1,000 mg by mouth every 6 (six) hours as needed for mild pain.   Marland Kitchen anagrelide (AGRYLIN) 1 MG capsule TAKE 1 CAPSULE (1 MG TOTAL) BY MOUTH 2 (TWO) TIMES DAILY.  Marland Kitchen apixaban (ELIQUIS) 5 MG TABS tablet Take 1 tablet (5 mg total) by mouth 2 (two) times daily.  Marland Kitchen aspirin EC 81 MG EC tablet Take 1 tablet (81 mg total) by mouth daily.  Marland Kitchen atorvastatin (LIPITOR) 40 MG tablet Take 1 tablet (40 mg total) by mouth daily at 6 PM.  . buPROPion (WELLBUTRIN SR) 150 MG 12 hr tablet Take 150 mg by mouth every morning.  . fluticasone (FLONASE) 50 MCG/ACT nasal spray Place 1 spray into both nostrils daily as needed for allergies.   Marland Kitchen propranolol ER (INDERAL LA) 160 MG SR capsule Take 1 capsule (160 mg total) by mouth daily.  Marland Kitchen zolpidem (AMBIEN CR) 12.5 MG CR tablet Take 6.25 mg by mouth at bedtime.  Allergies:   Patient has no known allergies.   Social History   Tobacco Use  . Smoking status: Never Smoker  . Smokeless tobacco: Never Used  Substance Use Topics  . Alcohol use: Yes    Alcohol/week: 0.0 standard drinks    Comment: Occasional  . Drug use: No     Family Hx: The patient's family history includes Aortic aneurysm in his father; Heart attack in his brother. There is no history of Colon cancer, Esophageal cancer, Pancreatic cancer, Prostate cancer, Rectal cancer, or Stomach cancer.  ROS:   Please see the  history of present illness.    No fevers, chills or productive cough. All other systems reviewed and are negative.   Recent Labs: 07/23/2018: TSH 3.250 09/14/2018: ALT 28; Magnesium 2.2 09/15/2018: BUN 14; Creatinine, Ser 0.95; Hemoglobin 11.3; Platelets 200; Potassium 3.9; Sodium 140   Recent Lipid Panel Lab Results  Component Value Date/Time   CHOL 142 09/13/2018 06:34 AM   TRIG 203 (H) 09/13/2018 06:34 AM   HDL 25 (L) 09/13/2018 06:34 AM   CHOLHDL 5.7 09/13/2018 06:34 AM   LDLCALC 76 09/13/2018 06:34 AM    Wt Readings from Last 3 Encounters:  09/24/18 193 lb (87.5 kg)  09/12/18 201 lb (91.2 kg)  09/04/18 226 lb (102.5 kg)     Objective:    Vital Signs:  BP 125/71   Pulse 62   Ht 5\' 11"  (1.803 m)   Wt 193 lb (87.5 kg)   BMI 26.92 kg/m    VITAL SIGNS:  reviewed  Answers questions appropriately Normal affect No acute distress Remainder of physical examination not performed (telehealth visit; coronavirus pandemic)  ASSESSMENT & PLAN:    1. Paroxysmal atrial fibrillation-Pt was in sinus rhythm during recent admission.  He presented with a CVA.  He will need anticoagulation for life.  Continue apixaban.  Aspirin 81 mg daily was added to his medical regimen as his CVA occurred on apixaban.  We will continue Inderal for rate control if atrial fibrillation recurs. 2. Hypertension-patient's blood pressure averages systolic 182 at home by his report.  Diastolic 88.  I will add losartan 50 mg daily.  Check potassium and renal function.  Follow blood pressure and adjust regimen as needed. 3. Hyperlipidemia-followed by primary care. 4. Recent CVA-follow-up neurology.  COVID-19 Education: The importance of social distancing was discussed today.  Time:   Today, I have spent 16 minutes with the patient with telehealth technology discussing the above problems.     Medication Adjustments/Labs and Tests Ordered: Current medicines are reviewed at length with the patient today.   Concerns regarding medicines are outlined above.   Tests Ordered: No orders of the defined types were placed in this encounter.   Medication Changes: No orders of the defined types were placed in this encounter.   Disposition:  Follow up in 3 month(s)  Signed, Kirk Ruths, MD  09/24/2018 9:12 AM    [place]

## 2018-09-24 ENCOUNTER — Telehealth: Payer: Self-pay

## 2018-09-24 ENCOUNTER — Telehealth: Payer: Managed Care, Other (non HMO) | Admitting: Cardiology

## 2018-09-24 ENCOUNTER — Other Ambulatory Visit: Payer: Self-pay

## 2018-09-24 ENCOUNTER — Telehealth (INDEPENDENT_AMBULATORY_CARE_PROVIDER_SITE_OTHER): Payer: Managed Care, Other (non HMO) | Admitting: Cardiology

## 2018-09-24 ENCOUNTER — Encounter: Payer: Self-pay | Admitting: Cardiology

## 2018-09-24 VITALS — BP 125/71 | HR 62 | Ht 71.0 in | Wt 193.0 lb

## 2018-09-24 DIAGNOSIS — I480 Paroxysmal atrial fibrillation: Secondary | ICD-10-CM | POA: Diagnosis not present

## 2018-09-24 DIAGNOSIS — I10 Essential (primary) hypertension: Secondary | ICD-10-CM

## 2018-09-24 DIAGNOSIS — E7800 Pure hypercholesterolemia, unspecified: Secondary | ICD-10-CM

## 2018-09-24 MED ORDER — LOSARTAN POTASSIUM 50 MG PO TABS: 50.0000 mg | ORAL_TABLET | Freq: Every day | ORAL | 3 refills | Status: DC

## 2018-09-24 NOTE — Telephone Encounter (Signed)

## 2018-09-24 NOTE — Patient Instructions (Signed)
Medication Instructions:  START LOSARTAN 50 MG ONCE DAILY If you need a refill on your cardiac medications before your next appointment, please call your pharmacy.   Lab work: If you have labs (blood work) drawn today and your tests are completely normal, you will receive your results only by: Marland Kitchen MyChart Message (if you have MyChart) OR . A paper copy in the mail If you have any lab test that is abnormal or we need to change your treatment, we will call you to review the results.  Follow-Up: . Your physician recommends that you schedule a follow-up appointment in: [place]

## 2018-09-30 ENCOUNTER — Other Ambulatory Visit: Payer: Self-pay

## 2018-09-30 ENCOUNTER — Inpatient Hospital Stay: Payer: Managed Care, Other (non HMO)

## 2018-09-30 ENCOUNTER — Inpatient Hospital Stay: Payer: Managed Care, Other (non HMO) | Attending: Hematology & Oncology | Admitting: Hematology & Oncology

## 2018-09-30 ENCOUNTER — Encounter: Payer: Self-pay | Admitting: Hematology & Oncology

## 2018-09-30 VITALS — BP 117/66 | HR 57 | Temp 98.0°F | Resp 16 | Wt 198.0 lb

## 2018-09-30 DIAGNOSIS — Z7982 Long term (current) use of aspirin: Secondary | ICD-10-CM | POA: Diagnosis not present

## 2018-09-30 DIAGNOSIS — D751 Secondary polycythemia: Secondary | ICD-10-CM

## 2018-09-30 DIAGNOSIS — D45 Polycythemia vera: Secondary | ICD-10-CM | POA: Diagnosis present

## 2018-09-30 DIAGNOSIS — Z7901 Long term (current) use of anticoagulants: Secondary | ICD-10-CM | POA: Diagnosis not present

## 2018-09-30 LAB — CBC WITH DIFFERENTIAL (CANCER CENTER ONLY)
Abs Immature Granulocytes: 0.86 10*3/uL — ABNORMAL HIGH (ref 0.00–0.07)
Basophils Absolute: 0.3 10*3/uL — ABNORMAL HIGH (ref 0.0–0.1)
Basophils Relative: 2 %
Eosinophils Absolute: 0.6 10*3/uL — ABNORMAL HIGH (ref 0.0–0.5)
Eosinophils Relative: 4 %
HCT: 37.3 % — ABNORMAL LOW (ref 39.0–52.0)
Hemoglobin: 12.1 g/dL — ABNORMAL LOW (ref 13.0–17.0)
Immature Granulocytes: 6 %
Lymphocytes Relative: 10 %
Lymphs Abs: 1.5 10*3/uL (ref 0.7–4.0)
MCH: 27.5 pg (ref 26.0–34.0)
MCHC: 32.4 g/dL (ref 30.0–36.0)
MCV: 84.8 fL (ref 80.0–100.0)
Monocytes Absolute: 1.4 10*3/uL — ABNORMAL HIGH (ref 0.1–1.0)
Monocytes Relative: 9 %
Neutro Abs: 10.7 10*3/uL — ABNORMAL HIGH (ref 1.7–7.7)
Neutrophils Relative %: 69 %
Platelet Count: 453 10*3/uL — ABNORMAL HIGH (ref 150–400)
RBC: 4.4 MIL/uL (ref 4.22–5.81)
RDW: 19.5 % — ABNORMAL HIGH (ref 11.5–15.5)
WBC Count: 15.3 10*3/uL — ABNORMAL HIGH (ref 4.0–10.5)
nRBC: 0.8 % — ABNORMAL HIGH (ref 0.0–0.2)

## 2018-09-30 LAB — RETICULOCYTES
Immature Retic Fract: 20.9 % — ABNORMAL HIGH (ref 2.3–15.9)
RBC.: 4.43 MIL/uL (ref 4.22–5.81)
Retic Count, Absolute: 164.8 10*3/uL (ref 19.0–186.0)
Retic Ct Pct: 3.7 % — ABNORMAL HIGH (ref 0.4–3.1)

## 2018-09-30 LAB — CMP (CANCER CENTER ONLY)
ALT: 20 U/L (ref 0–44)
AST: 25 U/L (ref 15–41)
Albumin: 4.9 g/dL (ref 3.5–5.0)
Alkaline Phosphatase: 86 U/L (ref 38–126)
Anion gap: 7 (ref 5–15)
BUN: 20 mg/dL (ref 8–23)
CO2: 29 mmol/L (ref 22–32)
Calcium: 9.2 mg/dL (ref 8.9–10.3)
Chloride: 107 mmol/L (ref 98–111)
Creatinine: 1.02 mg/dL (ref 0.61–1.24)
GFR, Est AFR Am: >60 mL/min (ref >60)
GFR, Estimated: >60 mL/min (ref >60)
Glucose, Bld: 92 mg/dL (ref 70–99)
Potassium: 5.1 mmol/L (ref 3.5–5.1)
Sodium: 143 mmol/L (ref 135–145)
Total Bilirubin: 1.1 mg/dL (ref 0.3–1.2)
Total Protein: 6.2 g/dL — ABNORMAL LOW (ref 6.5–8.1)

## 2018-09-30 LAB — LACTATE DEHYDROGENASE: LDH: 615 U/L — ABNORMAL HIGH (ref 98–192)

## 2018-09-30 LAB — SAVE SMEAR(SSMR), FOR PROVIDER SLIDE REVIEW

## 2018-09-30 MED ORDER — RUXOLITINIB PHOSPHATE 20 MG PO TABS: 20.0000 mg | ORAL_TABLET | Freq: Two times a day (BID) | ORAL | 4 refills | Status: DC

## 2018-09-30 NOTE — Progress Notes (Signed)
Hematology and Oncology Follow Up Visit  [name] [name] [id number] August 09, 1955 63 y.o. 09/30/2018   Principle Diagnosis:  Polycythemia vera- JAK2 (+) CVA/RIND Iron deficiency secondary to phlebotomies  Past Therapy: Hydrea 1000 mg by mouth daily - d/c on 11/13/2017  Current Therapy:   Phlebotomy to maintain hematocrit below 45% Eliquis 5 mg po BID - started 07/2018 EC ASA 81 mg po q day IV iron as indicated for iron deficiency and symptoms.  Anagrelide 2.0 mg po q day - change on 09/30/2018   Interim History:  [name] is here today for follow-up.  Unfortunately, since we last saw him, he actually had a CVA.  This may have been from paroxysmal atrial fibrillation.  This may have been from high blood pressure.  But also could be from his polycythemia.  He was admitted to the hospital in early May.  He had an extensive set of studies done.  MRI showed that he had an infarction within the cortex of the left superior precentral gyrus.  He was already on Eliquis.  He was also put on aspirin.  He had vascular studies.  He had echocardiogram.  My concern is that his polycythemia is becoming more of an issue.  We did do an ultrasound of his abdomen recently.  This showed that his spleen had increased quite a bit.  His spleen increased to a volume of 2037 cm.  This is quite larger than 1 he had his last ultrasound back in 2019.  I does have a feeling that his polycythemia is becoming more active.  Of course, anagrelide cannot be made right now.  He does have an extra supply of anagrelide.  I will put him on 2 mg daily of anagrelide to get his platelet count down.  I do think that we will have to get him on Jakafi.  I just have a feeling given his splenomegaly, that Shanon Brow is now needed.  We have talked to him about Jakafi in the past.  He was not all that interested in starting it.  He will clearly need to have a bone marrow biopsy now.  I talked him about this.  We really have to I  think slow down his bone marrow.  I think Shanon Brow can do this.  He went back to work today.  He is feeling okay.  He has had no nausea or vomiting.  He has had no issues with cough.  He says that his right hand still has occasional numbness.  Overall, I would say his performance status right now is ECOG 1.     Medications:  Allergies as of 09/30/2018   No Known Allergies     Medication List       Accurate as of Sep 30, 2018  1:49 PM. If you have any questions, ask your nurse or doctor.        acetaminophen 500 MG tablet Commonly known as:  TYLENOL Take 1,000 mg by mouth every 6 (six) hours as needed for mild pain.   anagrelide 1 MG capsule Commonly known as:  AGRYLIN TAKE 1 CAPSULE (1 MG TOTAL) BY MOUTH 2 (TWO) TIMES DAILY.   apixaban 5 MG Tabs tablet Commonly known as:  ELIQUIS Take 1 tablet (5 mg total) by mouth 2 (two) times daily.   aspirin 81 MG EC tablet Take 1 tablet (81 mg total) by mouth daily.   atorvastatin 40 MG tablet Commonly known as:  LIPITOR Take 1 tablet (40 mg total) by mouth daily  at 6 PM.   buPROPion 150 MG 12 hr tablet Commonly known as:  WELLBUTRIN SR Take 150 mg by mouth every morning.   fluticasone 50 MCG/ACT nasal spray Commonly known as:  FLONASE Place 1 spray into both nostrils daily as needed for allergies.   hydroxyurea 500 MG capsule Commonly known as:  HYDREA Take 1 capsule (500 mg total) by mouth 2 (two) times daily. May take with food to minimize GI side effects.   losartan 50 MG tablet Commonly known as:  COZAAR Take 1 tablet (50 mg total) by mouth daily.   losartan 25 MG tablet Commonly known as:  COZAAR Take 25 mg by mouth daily. Take two tablets by mouth , total of 50 mg daily.   propranolol ER 160 MG SR capsule Commonly known as:  INDERAL LA Take 1 capsule (160 mg total) by mouth daily.   zolpidem 12.5 MG CR tablet Commonly known as:  AMBIEN CR Take 6.25 mg by mouth at bedtime.       Allergies: No Known  Allergies  Past Medical History, Surgical history, Social history, and Family History were reviewed and updated.  Review of Systems: Review of Systems  Constitutional: Negative.   HENT: Negative.   Eyes: Negative.   Respiratory: Negative.   Cardiovascular: Positive for palpitations.  Gastrointestinal: Positive for abdominal pain.  Genitourinary: Negative.   Musculoskeletal: Negative.   Skin: Negative.   Neurological: Negative.   Endo/Heme/Allergies: Negative.   Psychiatric/Behavioral: Negative.      Physical Exam:  weight is 198 lb (89.8 kg). His oral temperature is 98 F (36.7 C). His blood pressure is 117/66 and his pulse is 57 (abnormal). His respiration is 16 and oxygen saturation is 100%.   Wt Readings from Last 3 Encounters:  09/30/18 198 lb (89.8 kg)  09/24/18 193 lb (87.5 kg)  09/12/18 201 lb (91.2 kg)    Physical Exam Vitals signs reviewed.  HENT:     Head: Normocephalic and atraumatic.  Eyes:     Pupils: Pupils are equal, round, and reactive to light.  Neck:     Musculoskeletal: Normal range of motion.  Cardiovascular:     Rate and Rhythm: Normal rate and regular rhythm.     Heart sounds: Normal heart sounds.  Pulmonary:     Effort: Pulmonary effort is normal.     Breath sounds: Normal breath sounds.  Abdominal:     General: Bowel sounds are normal.     Palpations: Abdomen is soft.     Comments: Abdominal exam shows a soft abdomen with good bowel sounds.  He has no fluid wave.  There is no guarding or rebound tenderness.  There is no obvious hepatomegaly.  His spleen tip does feel about 3 or 4 cm below the left costal margin.  Musculoskeletal: Normal range of motion.        General: No tenderness or deformity.  Lymphadenopathy:     Cervical: No cervical adenopathy.  Skin:    General: Skin is warm and dry.     Findings: No erythema or rash.  Neurological:     Mental Status: He is alert and oriented to person, place, and time.  Psychiatric:         Behavior: Behavior normal.        Thought Content: Thought content normal.        Judgment: Judgment normal.      Lab Results  Component Value Date   WBC 15.3 (H) 09/30/2018   HGB 12.1 (  L) 09/30/2018   HCT 37.3 (L) 09/30/2018   MCV 84.8 09/30/2018   PLT 453 (H) 09/30/2018   Lab Results  Component Value Date   FERRITIN 85 09/04/2018   IRON 94 09/04/2018   TIBC 381 09/04/2018   UIBC 288 09/04/2018   IRONPCTSAT 25 09/04/2018   Lab Results  Component Value Date   RETICCTPCT 3.7 (H) 09/30/2018   RBC 4.43 09/30/2018   RETICCTABS 87.6 02/21/2011   No results found for: KPAFRELGTCHN, LAMBDASER, KAPLAMBRATIO No results found for: IGGSERUM, IGA, IGMSERUM No results found for: Odetta Pink, SPEI   Chemistry      Component Value Date/Time   NA 143 09/30/2018 1146   NA 144 04/25/2017 1506   NA 139 04/12/2016 1113   K 5.1 09/30/2018 1146   K 4.2 04/25/2017 1506   K 4.1 04/12/2016 1113   CL 107 09/30/2018 1146   CL 105 04/25/2017 1506   CO2 29 09/30/2018 1146   CO2 29 04/25/2017 1506   CO2 22 04/12/2016 1113   BUN 20 09/30/2018 1146   BUN 17 04/25/2017 1506   BUN 16.3 04/12/2016 1113   CREATININE 1.02 09/30/2018 1146   CREATININE 1.2 04/25/2017 1506   CREATININE 0.9 04/12/2016 1113      Component Value Date/Time   CALCIUM 9.2 09/30/2018 1146   CALCIUM 9.5 04/25/2017 1506   CALCIUM 9.1 04/12/2016 1113   ALKPHOS 86 09/30/2018 1146   ALKPHOS 73 04/25/2017 1506   ALKPHOS 66 04/12/2016 1113   AST 25 09/30/2018 1146   AST 31 04/12/2016 1113   ALT 20 09/30/2018 1146   ALT 58 (H) 04/25/2017 1506   ALT 36 04/12/2016 1113   BILITOT 1.1 09/30/2018 1146   BILITOT 0.73 04/12/2016 1113       Impression and Plan: Mr. Morici is a very pleasant 63 yo caucasian gentleman with polycythemia vera, JAK2 positive.   We are going to have to change "gears".  I think the bone marrow biopsy will be critical.  We will see about the  cytogenetics.  I just feel that I cannot overlook the fact that the polycythemia could have been a factor with his CVA.  I realize that he has other risk factors but I want to make sure that we do what we can do minimize the polycythemia as a risk factor.  This is quite complicated.  I spent 35 minutes with him.  I had to go over all of his lab results when he was in the hospital.  I went over his lab test today.  I explained the bone marrow biopsy.  I explained the Jakafi again.  I would like to do back to see me in another 2 or 3 weeks.     [name], MD 5/26/20201:49 PM

## 2018-10-01 ENCOUNTER — Telehealth: Payer: Self-pay | Admitting: Pharmacist

## 2018-10-01 ENCOUNTER — Telehealth: Payer: Self-pay | Admitting: Hematology & Oncology

## 2018-10-01 ENCOUNTER — Telehealth: Payer: Self-pay | Admitting: Pharmacy Technician

## 2018-10-01 ENCOUNTER — Encounter: Payer: Self-pay | Admitting: Pharmacist

## 2018-10-01 DIAGNOSIS — D751 Secondary polycythemia: Secondary | ICD-10-CM

## 2018-10-01 LAB — IRON AND TIBC
Iron: 132 ug/dL (ref 42–163)
Saturation Ratios: 36 % (ref 20–55)
TIBC: 371 ug/dL (ref 202–409)
UIBC: 239 ug/dL (ref 117–376)

## 2018-10-01 LAB — FERRITIN: Ferritin: 94 ng/mL (ref 24–336)

## 2018-10-01 NOTE — Progress Notes (Signed)
Erroneous encounter

## 2018-10-01 NOTE — Telephone Encounter (Signed)
Oral Oncology Patient Advocate Encounter  Submitted prior authorization request through [place] for [place].   Case # [id number] Status is pending  Oral Oncology Clinic will continue to follow.  [place] Patient [place] Phone [phone] Fax [phone] 10/01/2018 4:58 PM

## 2018-10-01 NOTE — Telephone Encounter (Signed)
Oral Oncology Pharmacist Encounter  Received new prescription for Jakafi (ruxolitinib) for the treatment of Polycythemia vera- JAK2 (+), planned duration until disease progression or unacceptable drug toxicity. Patient previously treated with hydroxyurea.  CBC from 10/01/2018 assessed, no relevant lab abnormalities. Prescription dose and frequency assessed.   Current medication list in Epic reviewed, one DDIs with ruxolitinib identified: - Ruxolitinib may enhance the bradycardic effect propranolol. No baseline dose adjustment needed, monitor patient's HR.  Prescription has been e-scribed to the [facility] for benefits analysis and approval.  Oral Oncology Clinic will continue to follow for insurance authorization, copayment issues, initial counseling and start date.  Darl Pikes, PharmD, BCPS, [facility] Hematology/Oncology Clinical Pharmacist [facility]/AP [facility] [place] [facility] [phone]  10/01/2018 2:01 PM

## 2018-10-01 NOTE — Telephone Encounter (Signed)
Spoke with patient to confirm 6/18 appt at 2 pm per 5/26 los. Pt request due to work schedule.

## 2018-10-02 ENCOUNTER — Other Ambulatory Visit: Payer: Managed Care, Other (non HMO)

## 2018-10-02 ENCOUNTER — Ambulatory Visit: Payer: Managed Care, Other (non HMO) | Admitting: Hematology & Oncology

## 2018-10-02 ENCOUNTER — Other Ambulatory Visit: Payer: Self-pay | Admitting: *Deleted

## 2018-10-02 MED ORDER — ANAGRELIDE HCL 1 MG PO CAPS: 1.0000 mg | ORAL_CAPSULE | Freq: Two times a day (BID) | ORAL | 1 refills | Status: DC

## 2018-10-06 NOTE — Telephone Encounter (Signed)
Oral Oncology Patient Advocate Encounter  Received a fax from [place] that Jakafi dose of 20 mg twice a day is not the starting dose per NCCN guidelines and would not be approved without a peer to peer.  Alyson, Pharmacist, verified with provider that 10 mg twice a day was ok to start with.  I called and updated dosing with an [employer] Clinical Representative.    [employer] will call and fax with prior authorization decision.  [name] [name] Phone [phone] Fax [phone] 10/06/2018 11:43 AM

## 2018-10-08 ENCOUNTER — Telehealth: Payer: Self-pay | Admitting: Pharmacy Technician

## 2018-10-08 ENCOUNTER — Other Ambulatory Visit: Payer: Self-pay | Admitting: Radiology

## 2018-10-08 MED ORDER — RUXOLITINIB PHOSPHATE 10 MG PO TABS: 10.0000 mg | ORAL_TABLET | Freq: Two times a day (BID) | ORAL | 4 refills | Status: DC

## 2018-10-08 NOTE — Telephone Encounter (Signed)
Oral Oncology Patient Advocate Encounter   Was successful in obtaining a copay card for [facility].  This copay card will make the patients copay $0.00.  I have spoken with the patient.    The billing information is as follows and has been shared with [place].   RxBin: [id number] Member ID: [id number] Group ID: [id number]   [name] [name] [name] [name] Patient [place] Phone [phone] Fax [phone] 10/08/2018 9:01 AM

## 2018-10-08 NOTE — Telephone Encounter (Signed)
Oral Oncology Patient Advocate Encounter  Prior Authorization for Shanon Brow has been approved.    Case # [id number] Effective dates: 10/01/2018 through 10/01/2019  Patients co-pay is $60.00.  I have obtained a copay card to reduce the patients copay to $0.00.  I will document billing information in another encounter.  Oral Oncology Clinic will continue to follow.   [place] Patient [place] Phone [phone] Fax [phone] 10/08/2018 8:39 AM

## 2018-10-09 ENCOUNTER — Other Ambulatory Visit: Payer: Self-pay | Admitting: Radiology

## 2018-10-09 NOTE — Telephone Encounter (Signed)
Oral Oncology Patient Advocate Encounter  Called patient and left message on 6/3 and 6/4.

## 2018-10-10 ENCOUNTER — Other Ambulatory Visit: Payer: Self-pay

## 2018-10-10 ENCOUNTER — Encounter (HOSPITAL_COMMUNITY): Payer: Self-pay

## 2018-10-10 ENCOUNTER — Telehealth: Payer: Self-pay | Admitting: Pharmacist

## 2018-10-10 ENCOUNTER — Ambulatory Visit (HOSPITAL_COMMUNITY)
Admission: RE | Admit: 2018-10-10 | Discharge: 2018-10-10 | Disposition: A | Discharge status: Home / Self Care | Payer: Managed Care, Other (non HMO) | Source: Ambulatory Visit | Attending: Hematology & Oncology | Admitting: Hematology & Oncology

## 2018-10-10 DIAGNOSIS — D45 Polycythemia vera: Secondary | ICD-10-CM | POA: Insufficient documentation

## 2018-10-10 DIAGNOSIS — D72829 Elevated white blood cell count, unspecified: Secondary | ICD-10-CM | POA: Insufficient documentation

## 2018-10-10 DIAGNOSIS — D649 Anemia, unspecified: Secondary | ICD-10-CM | POA: Diagnosis not present

## 2018-10-10 DIAGNOSIS — D751 Secondary polycythemia: Secondary | ICD-10-CM

## 2018-10-10 HISTORY — DX: Cerebral infarction, unspecified: I63.9

## 2018-10-10 LAB — CBC WITH DIFFERENTIAL/PLATELET
Abs Immature Granulocytes: 0.61 10*3/uL — ABNORMAL HIGH (ref 0.00–0.07)
Basophils Absolute: 0.2 10*3/uL — ABNORMAL HIGH (ref 0.0–0.1)
Basophils Relative: 2 %
Eosinophils Absolute: 0.5 10*3/uL (ref 0.0–0.5)
Eosinophils Relative: 4 %
HCT: 37.7 % — ABNORMAL LOW (ref 39.0–52.0)
Hemoglobin: 12.2 g/dL — ABNORMAL LOW (ref 13.0–17.0)
Immature Granulocytes: 5 %
Lymphocytes Relative: 10 %
Lymphs Abs: 1.3 10*3/uL (ref 0.7–4.0)
MCH: 28 pg (ref 26.0–34.0)
MCHC: 32.4 g/dL (ref 30.0–36.0)
MCV: 86.5 fL (ref 80.0–100.0)
Monocytes Absolute: 1.2 10*3/uL — ABNORMAL HIGH (ref 0.1–1.0)
Monocytes Relative: 9 %
Neutro Abs: 9.3 10*3/uL — ABNORMAL HIGH (ref 1.7–7.7)
Neutrophils Relative %: 70 %
Platelets: 286 10*3/uL (ref 150–400)
RBC: 4.36 MIL/uL (ref 4.22–5.81)
RDW: 19.4 % — ABNORMAL HIGH (ref 11.5–15.5)
WBC: 13.1 10*3/uL — ABNORMAL HIGH (ref 4.0–10.5)
nRBC: 0.9 % — ABNORMAL HIGH (ref 0.0–0.2)

## 2018-10-10 LAB — PROTIME-INR
INR: 1.4 — ABNORMAL HIGH (ref 0.8–1.2)
Prothrombin Time: 16.7 seconds — ABNORMAL HIGH (ref 11.4–15.2)

## 2018-10-10 MED ORDER — MIDAZOLAM HCL 2 MG/2ML IJ SOLN
INTRAMUSCULAR | Status: AC
  Filled 2018-10-10: qty 4

## 2018-10-10 MED ORDER — SODIUM CHLORIDE 0.9 % IV SOLN
INTRAVENOUS | Status: DC
  Administered 2018-10-10: 08:00:00 via INTRAVENOUS

## 2018-10-10 MED ORDER — FENTANYL CITRATE (PF) 100 MCG/2ML IJ SOLN
INTRAMUSCULAR | Status: AC
  Filled 2018-10-10: qty 2

## 2018-10-10 MED ORDER — FENTANYL CITRATE (PF) 100 MCG/2ML IJ SOLN
INTRAMUSCULAR | Status: AC | PRN
  Administered 2018-10-10 (×2): 50 ug via INTRAVENOUS

## 2018-10-10 MED ORDER — MIDAZOLAM HCL 2 MG/2ML IJ SOLN
INTRAMUSCULAR | Status: AC | PRN
  Administered 2018-10-10 (×2): 1 mg via INTRAVENOUS

## 2018-10-10 MED ORDER — FLUMAZENIL 0.5 MG/5ML IV SOLN
INTRAVENOUS | Status: AC
  Filled 2018-10-10: qty 5

## 2018-10-10 MED ORDER — NALOXONE HCL 0.4 MG/ML IJ SOLN
INTRAMUSCULAR | Status: AC
  Filled 2018-10-10: qty 1

## 2018-10-10 NOTE — Telephone Encounter (Signed)
Oral Oncology Patient Advocate Encounter  Spoke to Mr Carreiro this afternoon. Set up shipment of [place] from [facility].  Jakafi scheduled to be delivered 10/14/2018.  [place] Patient [place] Phone [phone] Fax [phone] 10/10/2018 2:31 PM

## 2018-10-10 NOTE — Consult Note (Signed)
Chief Complaint: Patient was seen in consultation today for CT guided bone marrow biopsy  Referring Physician(s): Ennever,Peter R  Supervising Physician: Sandi Mariscal  Patient Status: [facility] - Out-pt  History of Present Illness: [name] [name] is a 63 y.o. male with history of recent CVA/RIND, iron deficiency and polycythemia vera who presents today for CT-guided bone marrow biopsy to rule out transformation to myelofibrosis.   Past Medical History:  Diagnosis Date   Diverticulosis of colon (without mention of hemorrhage)    Hemorrhoids    Hypertension    IBS (irritable bowel syndrome)    Iron deficiency anemia due to chronic blood loss 09/26/2017   Irregular heartbeat    Nephrolithiasis    Other and unspecified hyperlipidemia    Perirectal fistula    Polycythemia, secondary    Stroke (HCC)    Stye    blocker duct right eye lid    Past Surgical History:  Procedure Laterality Date   RETINAL DETACHMENT SURGERY     ROTATOR CUFF REPAIR     rt.   VASECTOMY      Allergies: Patient has no known allergies.  Medications: Prior to Admission medications   Medication Sig Start Date End Date Taking? Authorizing Provider  acetaminophen (TYLENOL) 500 MG tablet Take 1,000 mg by mouth every 6 (six) hours as needed for mild pain.    Yes [provider]  anagrelide (AGRYLIN) 1 MG capsule Take 1 capsule (1 mg total) by mouth 2 (two) times daily. 10/02/18  Yes Ennever, Rudell Cobb, MD  apixaban (ELIQUIS) 5 MG TABS tablet Take 1 tablet (5 mg total) by mouth 2 (two) times daily. 07/23/18  Yes Lelon Perla, MD  aspirin EC 81 MG EC tablet Take 1 tablet (81 mg total) by mouth daily. 09/16/18  Yes Lavina Hamman, MD  atorvastatin (LIPITOR) 40 MG tablet Take 1 tablet (40 mg total) by mouth daily at 6 PM. 09/15/18  Yes Lavina Hamman, MD  buPROPion [facility] SR) 150 MG 12 hr tablet Take 150 mg by mouth every morning. 07/31/15  Yes [provider]  fluticasone  (FLONASE) 50 MCG/ACT nasal spray Place 1 spray into both nostrils daily as needed for allergies.    Yes [provider]  hydroxyurea (HYDREA) 500 MG capsule Take 1 capsule (500 mg total) by mouth 2 (two) times daily. May take with food to minimize GI side effects. Patient taking differently: Take 500 mg by mouth daily. May take with food to minimize GI side effects. 09/19/18  Yes [name], MD  losartan (COZAAR) 25 MG tablet Take 25 mg by mouth daily. Take two tablets by mouth , total of 50 mg daily. 09/24/18  Yes [provider]  propranolol ER (INDERAL LA) 160 MG SR capsule Take 1 capsule (160 mg total) by mouth daily. 07/23/18  Yes Lelon Perla, MD  zolpidem (AMBIEN CR) 12.5 MG CR tablet Take 6.25 mg by mouth at bedtime.    Yes [provider]  losartan (COZAAR) 50 MG tablet Take 1 tablet (50 mg total) by mouth daily. 09/24/18 12/23/18  Lelon Perla, MD  ruxolitinib phosphate (JAKAFI) 10 MG tablet Take 1 tablet (10 mg total) by mouth 2 (two) times daily. 10/08/18   [name], MD     Family History  Problem Relation Age of Onset   Aortic aneurysm Father    Heart attack Brother    Colon cancer Neg Hx    Esophageal cancer Neg Hx  Pancreatic cancer Neg Hx    Prostate cancer Neg Hx    Rectal cancer Neg Hx    Stomach cancer Neg Hx     Social History   Socioeconomic History   Marital status: Married    Spouse name: Not on file   Number of children: 2   Years of education: Not on file   Highest education level: Not on file  Occupational History   Occupation: Lobbyist: [employer] resource strain: Not on file   Food insecurity:    Worry: Not on file    Inability: Not on file   Transportation needs:    Medical: Not on file    Non-medical: Not on file  Tobacco Use   Smoking status: Never Smoker   Smokeless tobacco: Never Used  Substance and Sexual Activity   Alcohol use:  Yes    Alcohol/week: 0.0 standard drinks    Comment: Occasional   Drug use: No   Sexual activity: Not on file  Lifestyle   Physical activity:    Days per week: Not on file    Minutes per session: Not on file   Stress: Not on file  Relationships   Social connections:    Talks on phone: Not on file    Gets together: Not on file    Attends religious service: Not on file    Active member of club or organization: Not on file    Attends meetings of clubs or organizations: Not on file    Relationship status: Not on file  Other Topics Concern   Not on file  Social History Narrative   Not on file      Review of Systems currently denies fever, headache, chest pain, dyspnea, cough, abdominal pain, back pain, nausea, vomiting or bleeding.  He does have fatigue and some mild right hand weakness/paresthesias.  Vital Signs: BP 124/80    Pulse 65    Temp 98.1 F (36.7 C) (Oral)    Resp 16    SpO2 100%   Physical Exam awake, alert.  Chest clear to auscultation bilaterally.  Heart with regular rate and rhythm.  Abdomen soft, positive bowel sounds, nontender.  No lower extremity edema.  Imaging: Ct Angio Head W Or Wo Contrast  Result Date: 09/13/2018 CLINICAL DATA:  63 y/o  M; stroke for follow-up. EXAM: CT ANGIOGRAPHY HEAD AND NECK TECHNIQUE: Multidetector CT imaging of the head and neck was performed using the standard protocol during bolus administration of intravenous contrast. Multiplanar CT image reconstructions and MIPs were obtained to evaluate the vascular anatomy. Carotid stenosis measurements (when applicable) are obtained utilizing NASCET criteria, using the distal internal carotid diameter as the denominator. CONTRAST:  135m OMNIPAQUE IOHEXOL 350 MG/ML SOLN COMPARISON:  09/12/2018 CT head.  09/13/2018 MRI head. FINDINGS: CTA NECK FINDINGS Aortic arch: Standard branching. Imaged portion shows no evidence of aneurysm or dissection. No significant stenosis of the major arch vessel  origins. Right carotid system: No evidence of dissection, stenosis (50% or greater) or occlusion. Mild ectasia of the mid cervical ICA. Left carotid system: No evidence of dissection, stenosis (50% or greater) or occlusion. 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 There is lobulation to the lumen of the ICA and a focal posteriorly directed sacculation/penetrating ulcer at the C2 level which measures 3 mm in depth (series 10, image 127). Vertebral  arteries: Right dominant. No evidence of dissection, stenosis (50% or greater) or occlusion. Skeleton: Negative. Other neck: Negative. Upper chest: Negative. Review of the MIP images confirms the above findings CTA HEAD FINDINGS Anterior circulation: Moderate right A1 and right M2 origin stenosis. Multiple areas of mild stenosis in the bilateral M2 segments. No additional scratch proximal high-grade stenosis, large vessel occlusion, aneurysm, or vascular malformation. There is a beaded appearance with multiple segments of stenosis in both the MCA and ACA distributions involving small vessels. Posterior circulation: No significant stenosis, proximal occlusion, aneurysm, or vascular malformation. Venous sinuses: As permitted by contrast timing, patent. Anatomic variants: None significant. Delayed phase: No abnormal intracranial enhancement. Review of the MIP images confirms the above findings IMPRESSION: 1. Left-greater-than-right cervical ICA ectasia measuring up to 9 mm diameter on the left. Left upper ICA small posterior directed sacculation/penetrating ulcer at C2 level. Multiple segments of stenosis in the intracranial circulation including a beaded appearance in the small vessels of the anterior circulation. Relatively mild atherosclerotic disease. 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 2. No dissection,  large vessel occlusion, aneurysm, or vascular malformation identified. No high-grade stenosis in the neck. Electronically Signed   By: Kristine Garbe M.D.   On: 09/13/2018 05:57   Dg Chest 2 View  Result Date: 09/12/2018 CLINICAL DATA:  Numbness EXAM: CHEST - 2 VIEW COMPARISON:  05/14/2014 FINDINGS: The heart size and mediastinal contours are within normal limits. Both lungs are clear. The visualized skeletal structures are unremarkable. Postsurgical changes in the right shoulder are again seen. IMPRESSION: No active cardiopulmonary disease. Electronically Signed   By: Inez Catalina M.D.   On: 09/12/2018 17:47   Ct Head Wo Contrast  Result Date: 09/14/2018 CLINICAL DATA:  Follow-up stroke EXAM: CT HEAD WITHOUT CONTRAST TECHNIQUE: Contiguous axial images were obtained from the base of the skull through the vertex without intravenous contrast. COMPARISON:  CT brain, 09/12/2018, MR brain, 09/13/2018 FINDINGS: Brain: No evidence of acute infarction, hemorrhage, hydrocephalus, extra-axial collection or mass lesion/mass effect. Vascular: No hyperdense vessel or unexpected calcification. Skull: Normal. Negative for fracture or focal lesion. Sinuses/Orbits: No acute finding. Other: None. IMPRESSION: No acute intracranial pathology by CT. No evidence of stroke or hemorrhage. Electronically Signed   By: Eddie Candle M.D.   On: 09/14/2018 15:26   Ct Head Wo Contrast  Result Date: 09/12/2018 CLINICAL DATA:  Right leg tingling for several days, headaches, initial encounter EXAM: CT HEAD WITHOUT CONTRAST TECHNIQUE: Contiguous axial images were obtained from the base of the skull through the vertex without intravenous contrast. COMPARISON:  None. FINDINGS: Brain: No evidence of acute infarction, hemorrhage, hydrocephalus, extra-axial collection or mass lesion/mass effect. Mild atrophic changes are noted. Vascular: No hyperdense vessel or unexpected calcification. Skull: Normal. Negative for fracture or focal  lesion. Sinuses/Orbits: No acute finding. Other: None. IMPRESSION: Mild atrophic changes without acute abnormality. Electronically Signed   By: Inez Catalina M.D.   On: 09/12/2018 17:45   Ct Angio Neck W Or Wo Contrast  Result Date: 09/13/2018 CLINICAL DATA:  63 y/o  M; stroke for follow-up. EXAM: CT ANGIOGRAPHY HEAD AND NECK TECHNIQUE: Multidetector CT imaging of the head and neck was performed using the standard protocol during bolus administration of intravenous contrast. Multiplanar CT image reconstructions and MIPs were obtained to evaluate the vascular anatomy. Carotid stenosis measurements (when applicable) are obtained utilizing NASCET criteria, using the distal internal carotid diameter as the denominator. CONTRAST:  174m OMNIPAQUE IOHEXOL  350 MG/ML SOLN COMPARISON:  09/12/2018 CT head.  09/13/2018 MRI head. FINDINGS: CTA NECK FINDINGS Aortic arch: Standard branching. Imaged portion shows no evidence of aneurysm or dissection. No significant stenosis of the major arch vessel origins. Right carotid system: No evidence of dissection, stenosis (50% or greater) or occlusion. Mild ectasia of the mid cervical ICA. Left carotid system: No evidence of dissection, stenosis (50% or greater) or occlusion. 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 There is lobulation to the lumen of the ICA and a focal posteriorly directed sacculation/penetrating ulcer at the C2 level which measures 3 mm in depth (series 10, image 127). Vertebral arteries: Right dominant. No evidence of dissection, stenosis (50% or greater) or occlusion. Skeleton: Negative. Other neck: Negative. Upper chest: Negative. Review of the MIP images confirms the above findings CTA HEAD FINDINGS Anterior circulation: Moderate right A1 and right M2 origin stenosis. Multiple areas of mild stenosis in the bilateral M2 segments. No additional scratch proximal high-grade stenosis, large  vessel occlusion, aneurysm, or vascular malformation. There is a beaded appearance with multiple segments of stenosis in both the MCA and ACA distributions involving small vessels. Posterior circulation: No significant stenosis, proximal occlusion, aneurysm, or vascular malformation. Venous sinuses: As permitted by contrast timing, patent. Anatomic variants: None significant. Delayed phase: No abnormal intracranial enhancement. Review of the MIP images confirms the above findings IMPRESSION: 1. Left-greater-than-right cervical ICA ectasia measuring up to 9 mm diameter on the left. Left upper ICA small posterior directed sacculation/penetrating ulcer at C2 level. Multiple segments of stenosis in the intracranial circulation including a beaded appearance in the small vessels of the anterior circulation. Relatively mild atherosclerotic disease. 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 2. No dissection, large vessel occlusion, aneurysm, or vascular malformation identified. No high-grade stenosis in the neck. Electronically Signed   By: Kristine Garbe M.D.   On: 09/13/2018 05:57   [name] Contrast  Result Date: 09/13/2018 CLINICAL DATA:  63 y/o M; multiple episodes of weakness and feeling off balance since 09/07/2018. Focal neuro deficit, > 6 hrs, stroke suspected. EXAM: MRI HEAD WITHOUT CONTRAST TECHNIQUE: Multiplanar, multiecho pulse sequences of the brain and surrounding structures were obtained without intravenous contrast. COMPARISON:  09/12/2018 CT head. 01/19/2017 MRI head. 01/19/2017 MRA of the head. FINDINGS: Brain: Subcentimeter focus of reduced diffusion within the cortex of left superior precentral gyrus (series 5, image 85) compatible with acute/early subacute infarction. No associated hemorrhage or mass effect. No additional findings for stroke, hemorrhage, extra-axial collection, hydrocephalus, mass  effect, or herniation. There is a very small chronic infarction within the right cerebellar hemisphere. Few punctate nonspecific T2 FLAIR hyperintensities in subcortical white matter are compatible with mild chronic microvascular ischemic changes. Mild volume loss of the brain. Vascular: Curvilinear interrupted increased susceptibility hypointensity, likely associated with vessels in the left posterior sylvian fissure (series 4, image 35). No signal abnormality on additional sequences or prior CT. Skull and upper cervical spine: Normal marrow signal. Sinuses/Orbits: Negative. Other: None. IMPRESSION: 1. Subcentimeter acute/early subacute infarction within the cortex of left superior precentral gyrus. No associated hemorrhage or mass effect. 2. Increased susceptibility signal appears associated with blood vessel in the left posterior sylvian fissure of uncertain significance, possibly hyperemia associated with recent ischemia given adjacent infarction. No vascular malformation is present this location prior MRA of the head. Multiple thromboemboli or subarachnoid hemorrhage is  considered unlikely in the absence of secondary findings. 3. Mild chronic microvascular ischemic changes of the brain. Very small chronic infarction in the right cerebellar hemisphere. These results were called by telephone at the time of interpretation on 09/13/2018 at 1:37 am to Dr. Pryor Curia , who verbally acknowledged these results. Electronically Signed   By: Kristine Garbe M.D.   On: 09/13/2018 01:45   US Abdomen Complete  Result Date: 09/11/2018 CLINICAL DATA:  Polycythemia, assess for splenomegaly EXAM: ABDOMEN ULTRASOUND COMPLETE COMPARISON:  CT abdomen/pelvis dated 08/27/2018. FINDINGS: Gallbladder: Adherent gallstones vs gallbladder polyps measuring up to 5 mm. No sonographic Murphy sign noted by sonographer. Common bile duct: Diameter: 4 mm Liver: Within the upper limits of normal for parenchymal echogenicity. No focal  hepatic lesion is seen. Portal vein is patent on color Doppler imaging with normal direction of blood flow towards the liver. IVC: No abnormality visualized. Pancreas: Visualized portion unremarkable. Spleen: Enlarged, measuring 22.2 x 8.0 x 22.0 cm (calculated volume 2037 mL). Right Kidney: Length: 10.7 mL. Echogenicity within normal limits. No mass or hydronephrosis visualized. Left Kidney: Length: 11.7 mL. Echogenicity within normal limits. 1.9 x 1.7 x 2.0 cm mildly irregular cysts without solid component. No hydronephrosis. Abdominal aorta: No aneurysm visualized. Other findings: None. IMPRESSION: Splenomegaly, measuring 22.2 cm (calculated volume 2037 mL), as above. Electronically Signed   By: Julian Hy M.D.   On: 09/11/2018 10:37    Labs:  CBC: Recent Labs    09/14/18 0458 09/15/18 0558 09/30/18 1146 10/10/18 0802  WBC 10.8* 10.9* 15.3* 13.1*  HGB 11.2* 11.3* 12.1* 12.2*  HCT 34.2* 34.2* 37.3* 37.7*  PLT 207 200 453* 286    COAGS: Recent Labs    09/12/18 1714  INR 1.3*  APTT 40*    BMP: Recent Labs    09/14/18 0458 09/14/18 1230 09/15/18 0558 09/30/18 1146  NA 139 138 140 143  K 3.8 3.7 3.9 5.1  CL 104 104 104 107  CO2 '24 22 26 29  '$ GLUCOSE 97 91 89 92  BUN '14 13 14 20  '$ CALCIUM 8.7* 8.8* 8.8* 9.2  CREATININE 0.97 0.96 0.95 1.02  GFRNONAA >60 >60 >60 >60  GFRAA >60 >60 >60 >60    LIVER FUNCTION TESTS: Recent Labs    09/04/18 1441 09/12/18 1800 09/14/18 0458 09/30/18 1146  BILITOT 1.0 1.2 1.2 1.1  AST 34 '31 28 25  '$ ALT '29 29 28 20  '$ ALKPHOS 136* 117 120 86  PROT 6.6 6.4* 5.6* 6.2*  ALBUMIN 4.7 4.3 3.8 4.9    TUMOR MARKERS: No results for input(s): AFPTM, CEA, CA199, CHROMGRNA in the last 8760 hours.  Assessment and Plan: 63 y.o. male with history of recent CVA/RIND, iron deficiency and polycythemia vera who presents today for CT-guided bone marrow biopsy to rule out transformation to myelofibrosis.Risks and benefits of procedure was discussed  with the patient  including, but not limited to bleeding, infection, damage to adjacent structures or low yield requiring additional tests.  All of the questions were answered and there is agreement to proceed.  Consent signed and in chart.     Thank you for this interesting consult.  I greatly enjoyed meeting [name] [name] and look forward to participating in their care.  A copy of this report was sent to the requesting provider on this date.  Electronically Signed: D. Rowe Robert, PA-C 10/10/2018, 8:18 AM   I spent a total of 20 minutes   in face to face in clinical consultation, greater than  50% of which was counseling/coordinating care for CT guided bone marrow biopsy

## 2018-10-10 NOTE — Procedures (Signed)
Pre-procedure Diagnosis: Polycythemia vera  Post-procedure Diagnosis: Same  Technically successful CT guided bone marrow aspiration and biopsy of left iliac crest.   Complications: None Immediate  EBL: None  Signed: Sandi Mariscal Pager: [phone] 10/10/2018, 9:31 AM

## 2018-10-10 NOTE — Progress Notes (Signed)
Pt instructed to rebegin eliquis in am per Rowe Robert PA

## 2018-10-10 NOTE — Telephone Encounter (Signed)
Oral Chemotherapy Pharmacist Encounter   Spoke with Roselyn Reef, RN at to clarify the plan for the anagrelide and hydroxyurea to [name] [name] once he starts the [place]. She reported back that Dr. Marin Olp would like for [name] [name] to stop his anagrelide and hydroxyurea when he starts his [place].   I called [name] [name] back with his information and he stated his understanding of the plan.  Darl Pikes, PharmD, BCPS, [facility] Hematology/Oncology Clinical Pharmacist [facility] [phone]  10/10/2018 3:35 PM

## 2018-10-10 NOTE — Discharge Instructions (Signed)
Needle Biopsy, Care After °These instructions tell you how to care for yourself after your procedure. Your doctor may also give you more specific instructions. Call your doctor if you have any problems or questions. °What can I expect after the procedure? °After the procedure, it is common to have: °· Soreness. °· Bruising. °· Mild pain. °Follow these instructions at home: ° °· Return to your normal activities as told by your doctor. Ask your doctor what activities are safe for you. °· Take over-the-counter and prescription medicines only as told by your doctor. °· Wash your hands with soap and water before you change your bandage (dressing). If you cannot use soap and water, use hand sanitizer. °· Follow instructions from your doctor about: °? How to take care of your puncture site. °? When and how to change your bandage. °? When to remove your bandage. °· Check your puncture site every day for signs of infection. Watch for: °? Redness, swelling, or pain. °? Fluid or blood.  °? Pus or a bad smell. °? Warmth. °· Do not take baths, swim, or use a hot tub until your doctor approves. Ask your doctor if you may take showers. You may only be allowed to take sponge baths. °· Keep all follow-up visits as told by your doctor. This is important. °Contact a doctor if you have: °· A fever. °· Redness, swelling, or pain at the puncture site, and it lasts longer than a few days. °· Fluid, blood, or pus coming from the puncture site. °· Warmth coming from the puncture site. °Get help right away if: °· You have a lot of bleeding from the puncture site. °Summary °· After the procedure, it is common to have soreness, bruising, or mild pain at the puncture site. °· Check your puncture site every day for signs of infection, such as redness, swelling, or pain. °· Get help right away if you have severe bleeding from your puncture site. °This information is not intended to replace advice given to you by your health care provider. Make  sure you discuss any questions you have with your health care provider. °Document Released: 04/05/2008 Document Revised: 05/06/2017 Document Reviewed: 05/06/2017 °Elsevier Interactive Patient Education © 2019 [employer] °Moderate Conscious Sedation, Adult, Care After °These instructions provide you with information about caring for yourself after your procedure. Your health care provider may also give you more specific instructions. Your treatment has been planned according to current medical practices, but problems sometimes occur. Call your health care provider if you have any problems or questions after your procedure. °What can I expect after the procedure? °After your procedure, it is common: °· To feel sleepy for several hours. °· To feel clumsy and have poor balance for several hours. °· To have poor judgment for several hours. °· To vomit if you eat too soon. °Follow these instructions at home: °For at least 24 hours after the procedure: ° °· Do not: °? Participate in activities where you could fall or become injured. °? Drive. °? Use heavy machinery. °? Drink alcohol. °? Take sleeping pills or medicines that cause drowsiness. °? Make important decisions or sign legal documents. °? Take care of children on your own. °· Rest. °Eating and drinking °· Follow the diet recommended by your health care provider. °· If you vomit: °? Drink water, juice, or soup when you can drink without vomiting. °? Make sure you have little or no nausea before eating solid foods. °General instructions °· Have a responsible adult stay   with you until you are awake and alert. °· Take over-the-counter and prescription medicines only as told by your health care provider. °· If you smoke, do not smoke without supervision. °· Keep all follow-up visits as told by your health care provider. This is important. °Contact a health care provider if: °· You keep feeling nauseous or you keep vomiting. °· You feel light-headed. °· You develop a  rash. °· You have a fever. °Get help right away if: °· You have trouble breathing. °This information is not intended to replace advice given to you by your health care provider. Make sure you discuss any questions you have with your health care provider. °Document Released: 02/11/2013 Document Revised: 09/26/2015 Document Reviewed: 08/13/2015 °Elsevier Interactive Patient Education © 2019 [employer] ° °

## 2018-10-10 NOTE — Telephone Encounter (Signed)
Oral Chemotherapy Pharmacist Encounter  Patient Education I spoke with patient for overview of new oral chemotherapy medication: Jakafi (ruxolitinib) for the treatment of polycythemia vera, planned duration until disease progression or unacceptable drug toxicity.   Pt is doing well. Counseled patient on administration, dosing, side effects, monitoring, drug-food interactions, safe handling, storage, and disposal. Patient will take 1 tablet (10 mg total) by mouth 2 (two) times daily.  Side effects include but not limited to: decreased plt/hgb, cholesterol elevation.    Reviewed with patient importance of keeping a medication schedule and plan for any missed doses.  Mr. Dunlevy voiced understanding and appreciation. All questions answered. Medication handout placed in the mail.  Provided patient with [facility] phone number. Patient knows to call the office with questions or concerns. Oral [facility] will continue to follow.  Darl Pikes, PharmD, BCPS, [facility] Hematology/Oncology Clinical Pharmacist [facility] [phone]  10/10/2018 2:07 PM

## 2018-10-13 MED FILL — JAKAFI 10 MG TABLET: 10 | 30 days supply | Qty: 60 | Fill #0

## 2018-10-15 ENCOUNTER — Other Ambulatory Visit: Payer: Managed Care, Other (non HMO)

## 2018-10-15 ENCOUNTER — Ambulatory Visit: Payer: Managed Care, Other (non HMO) | Admitting: Hematology & Oncology

## 2018-10-16 ENCOUNTER — Other Ambulatory Visit: Payer: Self-pay | Admitting: Hematology & Oncology

## 2018-10-21 NOTE — Progress Notes (Signed)
Virtual Visit via Video Note   This visit type was conducted due to national recommendations for restrictions regarding the COVID-19 Pandemic (e.g. social distancing) in an effort to limit this patient's exposure and mitigate transmission in our community.  Due to his co-morbid illnesses, this patient is at least at moderate risk for complications without adequate follow up.  This format is felt to be most appropriate for this patient at this time.  All issues noted in this document were discussed and addressed.  A limited physical exam was performed with this format.  Please refer to the patient's chart for his consent to telehealth for [facility].   Date:  10/22/2018   ID:  [name] [name], DOB December 11, 1955, MRN [id number]  Patient Location: Home Provider Location: Home  PCP:  Leanna Battles, MD  Cardiologist:  Dr Stanford Breed  Evaluation Performed:  Follow-Up Visit  Chief Complaint:  Atrial fibrillation   History of Present Illness:    FU PAF. Stress echocardiogram June 2011 normal. TSH at that time also normal. Monitor February 2020 revealed sinus bradycardia, NSR, sinus tach, pacs, PVC, brief PAT and PAF.  Echo May 2020 showed normal LV function, mild diastolic dysfunction, mild left ventricular hypertrophy and negative saline microcavitation study.  Admitted with CVA May 2020.  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 Aspirin added to apixaban by neurology as CVA occurred on apixaban. Patient was seen in follow-up by Dr. Marin Olp and it was felt that polycythemia may be contributing to his recent CVA.  Since last seen  patient has occasional brief palpitations.  He denies chest pain, dyspnea or syncope.  No bleeding.  The patient does not have symptoms concerning for COVID-19 infection (fever, chills, cough, or  new shortness of breath).    Past Medical History:  Diagnosis Date  . Diverticulosis of colon (without mention of hemorrhage)   . Hemorrhoids   . Hypertension   . IBS (irritable bowel syndrome)   . Iron deficiency anemia due to chronic blood loss 09/26/2017  . Irregular heartbeat   . Nephrolithiasis   . Other and unspecified hyperlipidemia   . Perirectal fistula   . Polycythemia, secondary   . Stroke ([place])   . Stye    blocker duct right eye lid   Past Surgical History:  Procedure Laterality Date  . RETINAL DETACHMENT SURGERY    . ROTATOR CUFF REPAIR     rt.  Marland Kitchen VASECTOMY       Current Meds  Medication Sig  . acetaminophen (TYLENOL) 500 MG tablet Take 1,000 mg by mouth every 6 (six) hours as needed for mild pain.   Marland Kitchen apixaban (ELIQUIS) 5 MG TABS tablet Take 1 tablet (5 mg total) by mouth 2 (two) times daily.  Marland Kitchen aspirin EC 81 MG EC tablet Take 1 tablet (81 mg total) by mouth daily.  Marland Kitchen atorvastatin (LIPITOR) 40 MG tablet Take 1 tablet (40 mg total) by mouth daily at 6 PM.  . buPROPion (WELLBUTRIN SR) 150 MG 12 hr tablet Take 150 mg by mouth every morning.  . fluticasone (FLONASE) 50 MCG/ACT nasal spray Place 1 spray into both nostrils daily as needed for allergies.   Marland Kitchen losartan (COZAAR) 25 MG tablet Take 25 mg by mouth daily. Take two tablets by mouth , total of 50 mg daily.  . propranolol ER (INDERAL LA) 160 MG SR capsule Take 1 capsule (  160 mg total) by mouth daily.  . ruxolitinib phosphate (JAKAFI) 10 MG tablet Take 1 tablet (10 mg total) by mouth 2 (two) times daily.  Marland Kitchen zolpidem (AMBIEN CR) 12.5 MG CR tablet Take 6.25 mg by mouth at bedtime.      Allergies:   Patient has no known allergies.   Social History   Tobacco Use  . Smoking status: Never Smoker  . Smokeless tobacco: Never Used  Substance Use Topics  . Alcohol use: Yes    Alcohol/week: 0.0 standard drinks    Comment: Occasional  . Drug use: No     Family Hx: The patient's family history includes Aortic  aneurysm in his father; Heart attack in his brother. There is no history of Colon cancer, Esophageal cancer, Pancreatic cancer, Prostate cancer, Rectal cancer, or Stomach cancer.  ROS:   Please see the history of present illness.    No fevers, chills or productive cough. All other systems reviewed and are negative.  Recent Labs: 07/23/2018: TSH 3.250 09/14/2018: Magnesium 2.2 09/30/2018: ALT 20; BUN 20; Creatinine 1.02; Potassium 5.1; Sodium 143 10/10/2018: Hemoglobin 12.2; Platelets 286   Recent Lipid Panel Lab Results  Component Value Date/Time   CHOL 142 09/13/2018 06:34 AM   TRIG 203 (H) 09/13/2018 06:34 AM   HDL 25 (L) 09/13/2018 06:34 AM   CHOLHDL 5.7 09/13/2018 06:34 AM   LDLCALC 76 09/13/2018 06:34 AM    Wt Readings from Last 3 Encounters:  10/22/18 193 lb (87.5 kg)  09/30/18 198 lb (89.8 kg)  09/24/18 193 lb (87.5 kg)     Objective:    Vital Signs:  BP 123/78   Pulse (!) 55   Ht 5\' 11"  (1.803 m)   Wt 193 lb (87.5 kg)   BMI 26.92 kg/m    VITAL SIGNS:  reviewed  No acute distress Answers questions appropriately Normal affect Remainder physical examination not performed (telehealth visit; coronavirus pandemic)  ASSESSMENT & PLAN:    1. Paroxysmal atrial fibrillation-by history patient remains in sinus rhythm.  We will continue with present dose of Inderal for rate control if atrial fibrillation recurs.  Continue apixaban.  We will continue with aspirin as prior CVA occurred on apixaban alone. 2. Hypertension-blood pressure is controlled.  Continue present medications and follow. 3. Hyperlipidemia-followed by primary care. 4. Prior CVA-follow-up neurology. 5. Polycythemia vera-Per Dr. Marin Olp  COVID-19 Education: The importance of social distancing was discussed today.  Time:   Today, I have spent 17 minutes with the patient with telehealth technology discussing the above problems.     Medication Adjustments/Labs and Tests Ordered: Current medicines are  reviewed at length with the patient today.  Concerns regarding medicines are outlined above.   Tests Ordered: No orders of the defined types were placed in this encounter.   Medication Changes: No orders of the defined types were placed in this encounter.   Follow Up:  Virtual Visit or In Person in 6 month(s)  Signed, Kirk Ruths, MD  10/22/2018 8:10 AM    [place]

## 2018-10-22 ENCOUNTER — Encounter: Payer: Self-pay | Admitting: Cardiology

## 2018-10-22 ENCOUNTER — Other Ambulatory Visit: Payer: Self-pay

## 2018-10-22 ENCOUNTER — Telehealth (INDEPENDENT_AMBULATORY_CARE_PROVIDER_SITE_OTHER): Payer: Managed Care, Other (non HMO) | Admitting: Cardiology

## 2018-10-22 VITALS — BP 123/78 | HR 55 | Ht 71.0 in | Wt 193.0 lb

## 2018-10-22 DIAGNOSIS — E7800 Pure hypercholesterolemia, unspecified: Secondary | ICD-10-CM

## 2018-10-22 DIAGNOSIS — I480 Paroxysmal atrial fibrillation: Secondary | ICD-10-CM | POA: Diagnosis not present

## 2018-10-22 DIAGNOSIS — I10 Essential (primary) hypertension: Secondary | ICD-10-CM

## 2018-10-22 NOTE — Patient Instructions (Signed)

## 2018-10-23 ENCOUNTER — Inpatient Hospital Stay: Payer: Managed Care, Other (non HMO)

## 2018-10-23 ENCOUNTER — Inpatient Hospital Stay: Payer: Managed Care, Other (non HMO) | Attending: Hematology & Oncology | Admitting: Hematology & Oncology

## 2018-10-23 ENCOUNTER — Encounter: Payer: Self-pay | Admitting: Hematology & Oncology

## 2018-10-23 ENCOUNTER — Other Ambulatory Visit: Payer: Self-pay

## 2018-10-23 VITALS — BP 136/75 | HR 56 | Temp 98.2°F | Resp 16 | Wt 197.0 lb

## 2018-10-23 DIAGNOSIS — D751 Secondary polycythemia: Secondary | ICD-10-CM

## 2018-10-23 DIAGNOSIS — Z7901 Long term (current) use of anticoagulants: Secondary | ICD-10-CM | POA: Diagnosis not present

## 2018-10-23 DIAGNOSIS — Z79899 Other long term (current) drug therapy: Secondary | ICD-10-CM

## 2018-10-23 DIAGNOSIS — Z7982 Long term (current) use of aspirin: Secondary | ICD-10-CM | POA: Diagnosis not present

## 2018-10-23 DIAGNOSIS — Z7951 Long term (current) use of inhaled steroids: Secondary | ICD-10-CM

## 2018-10-23 DIAGNOSIS — D45 Polycythemia vera: Secondary | ICD-10-CM

## 2018-10-23 LAB — CMP (CANCER CENTER ONLY)
ALT: 30 U/L (ref 0–44)
AST: 31 U/L (ref 15–41)
Albumin: 4.7 g/dL (ref 3.5–5.0)
Alkaline Phosphatase: 74 U/L (ref 38–126)
Anion gap: 7 (ref 5–15)
BUN: 18 mg/dL (ref 8–23)
CO2: 29 mmol/L (ref 22–32)
Calcium: 9.5 mg/dL (ref 8.9–10.3)
Chloride: 106 mmol/L (ref 98–111)
Creatinine: 1 mg/dL (ref 0.61–1.24)
GFR, Est AFR Am: >60 mL/min (ref >60)
GFR, Estimated: >60 mL/min (ref >60)
Glucose, Bld: 93 mg/dL (ref 70–99)
Potassium: 4.7 mmol/L (ref 3.5–5.1)
Sodium: 142 mmol/L (ref 135–145)
Total Bilirubin: 1 mg/dL (ref 0.3–1.2)
Total Protein: 6.2 g/dL — ABNORMAL LOW (ref 6.5–8.1)

## 2018-10-23 LAB — CBC WITH DIFFERENTIAL (CANCER CENTER ONLY)
Abs Immature Granulocytes: 0.26 10*3/uL — ABNORMAL HIGH (ref 0.00–0.07)
Basophils Absolute: 0.2 10*3/uL — ABNORMAL HIGH (ref 0.0–0.1)
Basophils Relative: 2 %
Eosinophils Absolute: 0.4 10*3/uL (ref 0.0–0.5)
Eosinophils Relative: 4 %
HCT: 37.1 % — ABNORMAL LOW (ref 39.0–52.0)
Hemoglobin: 12.1 g/dL — ABNORMAL LOW (ref 13.0–17.0)
Immature Granulocytes: 3 %
Lymphocytes Relative: 15 %
Lymphs Abs: 1.6 10*3/uL (ref 0.7–4.0)
MCH: 28.3 pg (ref 26.0–34.0)
MCHC: 32.6 g/dL (ref 30.0–36.0)
MCV: 86.9 fL (ref 80.0–100.0)
Monocytes Absolute: 0.9 10*3/uL (ref 0.1–1.0)
Monocytes Relative: 9 %
Neutro Abs: 7.1 10*3/uL (ref 1.7–7.7)
Neutrophils Relative %: 67 %
Platelet Count: 371 10*3/uL (ref 150–400)
RBC: 4.27 MIL/uL (ref 4.22–5.81)
RDW: 19.8 % — ABNORMAL HIGH (ref 11.5–15.5)
WBC Count: 10.5 10*3/uL (ref 4.0–10.5)
nRBC: 1.1 % — ABNORMAL HIGH (ref 0.0–0.2)

## 2018-10-23 LAB — RETICULOCYTES
Immature Retic Fract: 18.1 % — ABNORMAL HIGH (ref 2.3–15.9)
RBC.: 4.33 MIL/uL (ref 4.22–5.81)
Retic Count, Absolute: 133.4 10*3/uL (ref 19.0–186.0)
Retic Ct Pct: 3.1 % (ref 0.4–3.1)

## 2018-10-23 NOTE — Progress Notes (Signed)
Hematology and Oncology Follow Up Visit  [name] [name] [id number] 1955/06/24 63 y.o. 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     Interim History:  Mr. Soloway is here today for follow-up.  We actually had a bone marrow biopsy done on him.  I thought we need to do this as his blood gas seem to be changing in his spleen was getting bigger.  The bone marrow biopsy was done on 10/10/2018.  The pathology report ([id number]) showed a hypercellular marrow.  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  I do not have the cytogenetics on this back yet.  This might be important.  We have started him on Jakafi.  He started this last week.  He actually is doing well with this.  He has had no problems with nausea or vomiting.  He has had no diarrhea.  He has had no cough.  He has had no rashes.  He seems to be doing pretty well.  He has had no issues with his failure to the stroke that he had.  He still has a little bit of tingling in the right hand but this seems to be improved.  He is on Eliquis and baby aspirin.  He has had no abdominal pain.  There is been no issues with fever.  Has had no leg swelling.  Overall, his performance status is ECOG 1.  Medications:  Allergies as of 10/23/2018   No Known Allergies     Medication List       Accurate as of October 23, 2018  2:45 PM. If you have any questions, ask your nurse or doctor.        acetaminophen 500 MG tablet Commonly known as: TYLENOL Take 1,000 mg by mouth every 6 (six) hours as needed for mild pain.   apixaban 5 MG Tabs tablet  Commonly known as: ELIQUIS Take 1 tablet (5 mg total) by mouth 2 (two) times daily.   aspirin 81 MG EC tablet Take 1 tablet (81 mg total) by mouth daily.   atorvastatin 40 MG tablet Commonly known as: LIPITOR Take 1 tablet (40 mg total) by mouth daily at 6 PM.   buPROPion 150 MG 12 hr tablet Commonly known as: WELLBUTRIN SR Take 150 mg by mouth every morning.   fluticasone 50 MCG/ACT nasal spray Commonly known as: FLONASE Place 1 spray into both nostrils daily as needed for allergies.   losartan 25 MG tablet Commonly known as: COZAAR Take 25 mg by mouth daily. Take two tablets by mouth , total of 50 mg daily.   propranolol ER 160 MG SR capsule Commonly known as: INDERAL LA Take 1 capsule (160 mg total) by mouth daily.   ruxolitinib phosphate 10 MG tablet Commonly known as: JAKAFI Take 1 tablet (10 mg total) by mouth 2 (two) times daily.   zolpidem 12.5 MG CR tablet Commonly known as: AMBIEN CR Take 6.25 mg by mouth at bedtime.       Allergies: No Known Allergies  Past Medical History, Surgical history, Social history, and Family History were reviewed and updated.  Review of Systems: Review of Systems  Constitutional: Negative.   HENT: Negative.   Eyes: Negative.   Respiratory: Negative.   Cardiovascular: Positive for palpitations.  Gastrointestinal: Positive for abdominal pain.  Genitourinary: Negative.   Musculoskeletal: Negative.   Skin: Negative.   Neurological: Negative.   Endo/Heme/Allergies: Negative.   Psychiatric/Behavioral: Negative.      Physical Exam:  weight is 197 lb (89.4 kg). His oral temperature is 98.2 F (36.8 C). His blood pressure is 136/75 and his pulse is 56 (abnormal). His respiration is 16 and oxygen saturation is 100%.   Wt Readings from Last 3 Encounters:  10/23/18 197 lb (89.4 kg)  10/22/18 193 lb (87.5 kg)  09/30/18 198 lb (89.8 kg)    Physical Exam Vitals signs reviewed.  HENT:     Head: Normocephalic and atraumatic.   Eyes:     Pupils: Pupils are equal, round, and reactive to light.  Neck:     Musculoskeletal: Normal range of motion.  Cardiovascular:     Rate and Rhythm: Normal rate and regular rhythm.     Heart sounds: Normal heart sounds.  Pulmonary:     Effort: Pulmonary effort is normal.     Breath sounds: Normal breath sounds.  Abdominal:     General: Bowel sounds are normal.     Palpations: Abdomen is soft.     Comments: Abdominal exam shows a soft abdomen with good bowel sounds.  He has no fluid wave.  There is no guarding or rebound tenderness.  There is no obvious hepatomegaly.  His spleen tip does feel about 2cm below the left costal margin.  Musculoskeletal: Normal range of motion.        General: No tenderness or deformity.  Lymphadenopathy:     Cervical: No cervical adenopathy.  Skin:    General: Skin is warm and dry.     Findings: No erythema or rash.  Neurological:     Mental Status: He is alert and oriented to person, place, and time.  Psychiatric:        Behavior: Behavior normal.        Thought Content: Thought content normal.        Judgment: Judgment normal.      Lab Results  Component Value Date   WBC 10.5 10/23/2018   HGB 12.1 (L) 10/23/2018   HCT 37.1 (L) 10/23/2018   MCV 86.9 10/23/2018   PLT 371 10/23/2018   Lab Results  Component Value Date   FERRITIN 94 09/30/2018   IRON 132 09/30/2018   TIBC 371 09/30/2018   UIBC 239 09/30/2018   IRONPCTSAT 36 09/30/2018   Lab Results  Component Value Date   RETICCTPCT 3.1 10/23/2018   RBC 4.27 10/23/2018   RBC 4.33 10/23/2018   RETICCTABS 87.6 02/21/2011   No results found for: KPAFRELGTCHN, LAMBDASER, KAPLAMBRATIO No results found for: IGGSERUM, IGA, IGMSERUM No results found for: Odetta Pink, SPEI   Chemistry      Component Value Date/Time   NA 143 09/30/2018 1146   NA 144 04/25/2017 1506   NA 139 04/12/2016 1113   K 5.1 09/30/2018 1146   K 4.2  04/25/2017 1506   K 4.1 04/12/2016 1113   CL 107 09/30/2018 1146   CL 105 04/25/2017 1506   CO2 29 09/30/2018 1146   CO2 29 04/25/2017 1506   CO2 22 04/12/2016 1113   BUN 20 09/30/2018 1146   BUN 17 04/25/2017 1506   BUN  16.3 04/12/2016 1113   CREATININE 1.02 09/30/2018 1146   CREATININE 1.2 04/25/2017 1506   CREATININE 0.9 04/12/2016 1113      Component Value Date/Time   CALCIUM 9.2 09/30/2018 1146   CALCIUM 9.5 04/25/2017 1506   CALCIUM 9.1 04/12/2016 1113   ALKPHOS 86 09/30/2018 1146   ALKPHOS 73 04/25/2017 1506   ALKPHOS 66 04/12/2016 1113   AST 25 09/30/2018 1146   AST 31 04/12/2016 1113   ALT 20 09/30/2018 1146   ALT 58 (H) 04/25/2017 1506   ALT 36 04/12/2016 1113   BILITOT 1.1 09/30/2018 1146   BILITOT 0.73 04/12/2016 1113       Impression and Plan: Mr. Schlitt is a very pleasant 63 yo caucasian gentleman with polycythemia vera, JAK2 positive.   I plan that we get the bone marrow biopsy.  This really helped [place] out.  Again, I think after having the polycythemia for about 20 years, but it is not surprising that he is trying to transform.  I did talk to him about the possibility of a bone marrow transplant.  I think this is some of that we might need to think about if we do not see a good response to [facility].  He is on a low dose of Jakafi right now.  I think we have a lot of room to move with the Jakafi dose if needed.  I want to get a splenic ultrasound on him when we see him back.  I would like to see if there is any change in his spleen size.  I spent about 40 minutes with him.  This was complicated as he clearly has a tough problem and I wanted to make sure we spent enough time on it since we are making the change with [facility] and going over any problems that he is having.  I will plan to see him back in another 4 weeks or so.    [name], MD 6/18/20202:45 PM

## 2018-10-24 LAB — IRON AND TIBC
Iron: 156 ug/dL (ref 42–163)
Saturation Ratios: 41 % (ref 20–55)
TIBC: 378 ug/dL (ref 202–409)
UIBC: 222 ug/dL (ref 117–376)

## 2018-10-24 LAB — FERRITIN: Ferritin: 88 ng/mL (ref 24–336)

## 2018-10-24 LAB — LACTATE DEHYDROGENASE: LDH: 634 U/L — ABNORMAL HIGH (ref 98–192)

## 2018-10-27 ENCOUNTER — Telehealth: Payer: Self-pay

## 2018-10-27 NOTE — Telephone Encounter (Signed)
I called pt to updated his chart. Rn updated pcp, meds and allergies. Pt does have link for video visit tomorrow.

## 2018-10-28 ENCOUNTER — Ambulatory Visit (INDEPENDENT_AMBULATORY_CARE_PROVIDER_SITE_OTHER): Payer: Managed Care, Other (non HMO) | Admitting: Adult Health

## 2018-10-28 ENCOUNTER — Other Ambulatory Visit: Payer: Self-pay

## 2018-10-28 DIAGNOSIS — I10 Essential (primary) hypertension: Secondary | ICD-10-CM

## 2018-10-28 DIAGNOSIS — I480 Paroxysmal atrial fibrillation: Secondary | ICD-10-CM | POA: Diagnosis not present

## 2018-10-28 DIAGNOSIS — I639 Cerebral infarction, unspecified: Secondary | ICD-10-CM

## 2018-10-28 DIAGNOSIS — E782 Mixed hyperlipidemia: Secondary | ICD-10-CM

## 2018-10-28 NOTE — Progress Notes (Signed)
[facility] [address] [place]. [place] [address] [phone]       VIRTUAL VISIT FOLLOW UP NOTE  [name] Date of Birth:  May 16, 1955 Medical Record Number:  [id number]   Reason for Referral:  hospital stroke follow up    Virtual Visit via Video Note  I connected with [name] [name] on 10/29/18 at  3:15 PM EDT by a video enabled telemedicine application located remotely in my own home and verified that I am speaking with the correct person using two identifiers who was located at their own home.   Visit scheduled by Lemont Fillers, RN. She discussed the limitations of evaluation and management by telemedicine and the availability of in person appointments. The patient expressed understanding and agreed to proceed.Please see telephone note for additional scheduling information and consent.    CHIEF COMPLAINT:  Chief Complaint  Patient presents with  . 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 History obtained from patient and chart review. Reviewed all radiology images and labs personally.  [name] [name] is a 63 y.o. male with history of polycythemia, hypertension, PVCs, brief PAT and paroxysmal atrial fibrillation seen on monitor in February 2020-on Eliquis,   who presented with right upper and lower extremity paresthesias.  He did not receive IV t-PA due to anticoagulation and late presentation (>4.5 hours from time of onset).  Stroke work-up revealed subcentimeter acute/late subacute infarction within the cortex of the left superior precentral gyrus likely cardioembolic.  Recommended continuation of Eliquis and potentially adding aspirin 81 mg which was recommended to be decided by cardiology.  HTN  stable.  LDL 76 and recommended atorvastatin 40 mg daily.  Other stroke risk factors include advanced age, EtOH use, history of stroke and atrial fibrillation.  He was discharged home in stable condition without therapy needs.  He has been stable from a stroke standpoint without residual deficits  He has had 2 additional episodes of right hand and foot cold sensation which lasted approximately 1 hour -denies weakness, numbness or tingling Decreased energy level which is slowly improving ?? 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 Diverticulosis of colon (without mention of hemorrhage)   . Hemorrhoids   . Hypertension   . IBS (irritable bowel syndrome)   . Iron deficiency anemia due to chronic blood loss 09/26/2017  . Irregular heartbeat   . Nephrolithiasis   . Other and unspecified hyperlipidemia   . Perirectal fistula   . Polycythemia, secondary   . Stroke ([place])   . Stye    blocker duct  right eye lid    PSH:  Past Surgical History:  Procedure Laterality Date  . RETINAL DETACHMENT SURGERY    . ROTATOR CUFF REPAIR     rt.  Marland Kitchen VASECTOMY      Social History:  Social History   Socioeconomic History  . Marital status: Married    Spouse name: Not on  file  . Number of children: 2  . Years of education: Not on file  . Highest education level: Not on file  Occupational History  . Occupation: Lobbyist: [employer]  Social Needs  . Financial resource strain: Not on file  . Food insecurity    Worry: Not on file    Inability: Not on file  . Transportation needs    Medical: Not on file    Non-medical: Not on file  Tobacco Use  . Smoking status: Never Smoker  . Smokeless tobacco: Never Used  Substance and Sexual Activity  . Alcohol use: Yes    Alcohol/week: 0.0 standard drinks    Comment: Occasional  . Drug use: No  . Sexual activity: Not on file  Lifestyle  . Physical activity    Days per week: Not on file    Minutes per session: Not on file  . Stress: Not on file  Relationships  . Social Herbalist on phone: Not on file    Gets together: Not on file    Attends religious service: Not on file    Active member of club or organization: Not on file    Attends meetings of clubs or organizations: Not on file    Relationship status: Not on file  . Intimate partner violence    Fear of current or ex partner: Not on file    Emotionally abused: Not on file    Physically abused: Not on file    Forced sexual activity: Not on file  Other Topics Concern  . Not on file  Social History Narrative  . Not on file    Family History:  Family History  Problem Relation Age of Onset  . Aortic aneurysm Father   . Heart attack Brother   . Colon cancer Neg Hx   . Esophageal cancer Neg Hx   . Pancreatic cancer Neg Hx   . Prostate cancer Neg Hx   . Rectal cancer Neg Hx   . Stomach cancer Neg Hx     Medications:   Current Outpatient Medications on File Prior to Visit  Medication Sig Dispense Refill  . acetaminophen (TYLENOL) 500 MG tablet Take 1,000 mg by mouth every 6 (six) hours as needed for mild pain.     Marland Kitchen apixaban (ELIQUIS) 5 MG TABS tablet Take 1 tablet (5 mg total) by mouth 2 (two) times daily. 60  tablet 6  . aspirin EC 81 MG EC tablet Take 1 tablet (81 mg total) by mouth daily. 60 tablet 0  . atorvastatin (LIPITOR) 40 MG tablet Take 1 tablet (40 mg total) by mouth daily at 6 PM. 60 tablet 0  . buPROPion (WELLBUTRIN SR) 150 MG 12 hr tablet Take 150 mg by mouth every morning.  12  . fluticasone (FLONASE) 50 MCG/ACT nasal spray Place 1 spray into both nostrils daily as needed for allergies.     Marland Kitchen losartan (COZAAR) 25 MG tablet Take 25 mg by mouth 2 (two) times daily. Take two tablets by mouth , total of 50 mg daily.    . propranolol ER (  INDERAL LA) 160 MG SR capsule Take 1 capsule (160 mg total) by mouth daily. 90 capsule 3  . ruxolitinib phosphate (JAKAFI) 10 MG tablet Take 1 tablet (10 mg total) by mouth 2 (two) times daily. 60 tablet 4  . zolpidem (AMBIEN CR) 12.5 MG CR tablet Take 6.25 mg by mouth at bedtime.      No current facility-administered medications on file prior to visit.     Allergies:  No Known Allergies   Physical Exam  Depression screen PHQ 2/9 10/28/2018  Decreased Interest 0  Down, Depressed, Hopeless 0  PHQ - 2 Score 0  Some recent data might be hidden    General: well developed, well nourished, pleasant middle-age Caucasian male, seated, in no evident distress Head: head normocephalic and atraumatic.    Neurologic Exam Mental Status: Awake and fully alert. Oriented to place and time. Recent and remote memory intact. Attention span, concentration and fund of knowledge appropriate. Mood and affect appropriate.  Cranial Nerves: Extraocular movements full without nystagmus. Hearing intact to voice. Facial sensation intact. Face, tongue, palate moves normally and symmetrically.  Shoulder shrug symmetric. Motor: No evidence of weakness per drift assessment Sensory.: intact to light touch Coordination: Rapid alternating movements normal in all extremities. Finger-to-nose and heel-to-shin performed accurately bilaterally. Gait and Station: Arises from chair without  difficulty. Stance is normal. Gait demonstrates normal stride length and balance .  Reflexes: UTA   NIHSS  0 Modified Rankin  1 CHA2DS2-VASc 3 HAS-BLED 2   Diagnostic Data (Labs, Imaging, Testing)  [name] Contrast 09/13/2018 IMPRESSION:  1. Subcentimeter acute/early subacute infarction within the cortex of left superior precentral gyrus. No associated hemorrhage or mass effect.  2. Increased susceptibility signal appears associated with blood vessel in the left posterior sylvian fissure of uncertain significance, possibly hyperemia associated with recent ischemia given adjacent infarction. No vascular malformation is present this location prior MRA of the head. Multiple thromboemboli or subarachnoid hemorrhage is considered unlikely in the absence of secondary findings.  3. Mild chronic microvascular ischemic changes of the brain. Very small chronic infarction in the right cerebellar hemisphere.    Ct Angio Head W Or Wo Contrast Ct Angio Neck W Or Wo Contrast  09/13/2018 IMPRESSION:  1. Left-greater-than-right cervical ICA ectasia measuring up to 9 mm diameter on the left. Left upper ICA small posterior directed sacculation/penetrating ulcer at C2 level. Multiple segments of stenosis in the intracranial circulation including a beaded appearance in the small vessels of the anterior circulation. Relatively mild atherosclerotic disease. 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  2. No dissection, large vessel occlusion, aneurysm, or vascular malformation identified. No high-grade stenosis in the neck.    Ct Head Wo Contrast 09/12/2018 IMPRESSION:  Mild atrophic changes without acute abnormality.    Dg Chest 2 View 09/12/2018 IMPRESSION:  No active cardiopulmonary disease.    Transthoracic Echocardiogram  Normal ejection fraction of 55 to 60%.  No cardiac source of embolism.   EKG - SB  rate 58 BPM. (See cardiology reading for complete details)     ASSESSMENT: [name] [name] is a 63 y.o. 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 Vascular risk factors include polycythemia, HTN, HLD, atrial fibrillation and prior stroke.     PLAN:  1. Cardioembolic stroke : Continue aspirin 81 mg daily and Eliquis (apixaban) daily  and lipitor 40mg   for secondary  stroke prevention. Maintain strict control of hypertension with blood pressure goal below 130/90, diabetes with hemoglobin A1c goal below 6.5% and cholesterol with LDL cholesterol (bad cholesterol) goal below 70 mg/dL.  I also advised the patient to eat a healthy diet with plenty of whole grains, cereals, fruits and vegetables, exercise regularly with at least 30 minutes of continuous activity daily and maintain ideal body weight. 2. HTN: Advised to continue current treatment regimen.  Advised to continue to monitor at home along with continued follow-up with PCP for management 3. HLD: Advised to continue current treatment regimen along with continued follow-up with PCP for future prescribing and monitoring of lipid panel 4. Post stroke anxiety: Long discussion regarding recurrent stroke symptoms and likelihood of recurrent stroke.  Also discussion regarding stress reduction techniques -continue on Wellbutrin 5. Atrial fibrillation: Ongoing follow-up with cardiology for Eliquis and atrial fibrillation management 6. Post stroke fatigue: Discussion regarding ongoing fatigue post stroke.  Advised him that this should improve over time but if it continues, may consider referral for sleep evaluation for potential underlying sleep apnea   Follow up in 4 months or call earlier if needed   Greater than 50% of time during this 30 minute non-face-to-face visit was spent on counseling, explanation of diagnosis of stroke, reviewing risk factor management of  HTN, HLD and atrial fibrillation, planning of further management along with potential future management, and discussion with patient and family answering all questions.    Venancio Poisson, AGNP-BC  [facility] [address] [place], [place] [address]  Phone [phone] Fax [phone] Note: This document was prepared with digital dictation and possible smart phrase technology. Any transcriptional errors that result from this process are unintentional.

## 2018-10-29 ENCOUNTER — Encounter: Payer: Self-pay | Admitting: Adult Health

## 2018-10-30 NOTE — Progress Notes (Signed)
I agree with the above plan 

## 2018-11-10 MED FILL — JAKAFI 10 MG TABLET: 10 | 30 days supply | Qty: 60 | Fill #1

## 2018-12-04 ENCOUNTER — Encounter: Payer: Self-pay | Admitting: Hematology & Oncology

## 2018-12-04 ENCOUNTER — Ambulatory Visit (HOSPITAL_BASED_OUTPATIENT_CLINIC_OR_DEPARTMENT_OTHER)
Admission: RE | Admit: 2018-12-04 | Discharge: 2018-12-04 | Disposition: A | Discharge status: Home / Self Care | Payer: Managed Care, Other (non HMO) | Source: Ambulatory Visit | Attending: Hematology & Oncology | Admitting: Hematology & Oncology

## 2018-12-04 ENCOUNTER — Other Ambulatory Visit: Payer: Self-pay

## 2018-12-04 ENCOUNTER — Inpatient Hospital Stay: Payer: Managed Care, Other (non HMO) | Attending: Hematology & Oncology | Admitting: Hematology & Oncology

## 2018-12-04 ENCOUNTER — Other Ambulatory Visit (HOSPITAL_BASED_OUTPATIENT_CLINIC_OR_DEPARTMENT_OTHER): Payer: Managed Care, Other (non HMO)

## 2018-12-04 ENCOUNTER — Inpatient Hospital Stay: Payer: Managed Care, Other (non HMO)

## 2018-12-04 VITALS — BP 123/69 | HR 58 | Temp 97.8°F | Resp 17 | Wt 195.0 lb

## 2018-12-04 DIAGNOSIS — D751 Secondary polycythemia: Secondary | ICD-10-CM

## 2018-12-04 LAB — CMP (CANCER CENTER ONLY)
ALT: 28 U/L (ref 0–44)
AST: 31 U/L (ref 15–41)
Albumin: 4.5 g/dL (ref 3.5–5.0)
Alkaline Phosphatase: 67 U/L (ref 38–126)
Anion gap: 7 (ref 5–15)
BUN: 20 mg/dL (ref 8–23)
CO2: 29 mmol/L (ref 22–32)
Calcium: 8.7 mg/dL — ABNORMAL LOW (ref 8.9–10.3)
Chloride: 108 mmol/L (ref 98–111)
Creatinine: 0.93 mg/dL (ref 0.61–1.24)
GFR, Est AFR Am: >60 mL/min (ref >60)
GFR, Estimated: >60 mL/min (ref >60)
Glucose, Bld: 93 mg/dL (ref 70–99)
Potassium: 4.1 mmol/L (ref 3.5–5.1)
Sodium: 144 mmol/L (ref 135–145)
Total Bilirubin: 1.3 mg/dL — ABNORMAL HIGH (ref 0.3–1.2)
Total Protein: 6 g/dL — ABNORMAL LOW (ref 6.5–8.1)

## 2018-12-04 LAB — CBC WITH DIFFERENTIAL (CANCER CENTER ONLY)
Abs Immature Granulocytes: 0.43 10*3/uL — ABNORMAL HIGH (ref 0.00–0.07)
Basophils Absolute: 0.1 10*3/uL (ref 0.0–0.1)
Basophils Relative: 2 %
Eosinophils Absolute: 0.4 10*3/uL (ref 0.0–0.5)
Eosinophils Relative: 4 %
HCT: 32 % — ABNORMAL LOW (ref 39.0–52.0)
Hemoglobin: 10.5 g/dL — ABNORMAL LOW (ref 13.0–17.0)
Immature Granulocytes: 5 %
Lymphocytes Relative: 14 %
Lymphs Abs: 1.3 10*3/uL (ref 0.7–4.0)
MCH: 28.5 pg (ref 26.0–34.0)
MCHC: 32.8 g/dL (ref 30.0–36.0)
MCV: 86.7 fL (ref 80.0–100.0)
Monocytes Absolute: 0.8 10*3/uL (ref 0.1–1.0)
Monocytes Relative: 9 %
Neutro Abs: 6.2 10*3/uL (ref 1.7–7.7)
Neutrophils Relative %: 66 %
Platelet Count: 290 10*3/uL (ref 150–400)
RBC: 3.69 MIL/uL — ABNORMAL LOW (ref 4.22–5.81)
RDW: 18.9 % — ABNORMAL HIGH (ref 11.5–15.5)
WBC Count: 9.3 10*3/uL (ref 4.0–10.5)
nRBC: 1.2 % — ABNORMAL HIGH (ref 0.0–0.2)

## 2018-12-04 LAB — RETICULOCYTES
Immature Retic Fract: 24 % — ABNORMAL HIGH (ref 2.3–15.9)
RBC.: 3.74 MIL/uL — ABNORMAL LOW (ref 4.22–5.81)
Retic Count, Absolute: 120.4 10*3/uL (ref 19.0–186.0)
Retic Ct Pct: 3.2 % — ABNORMAL HIGH (ref 0.4–3.1)

## 2018-12-04 LAB — SAVE SMEAR(SSMR), FOR PROVIDER SLIDE REVIEW

## 2018-12-04 LAB — LACTATE DEHYDROGENASE: LDH: 627 U/L — ABNORMAL HIGH (ref 98–192)

## 2018-12-04 NOTE — Progress Notes (Signed)
Hematology and Oncology Follow Up Visit  [name] [name] [id number] April 05, 1956 63 y.o. 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     Interim History:  [name] [name] is here today for follow-up.  So far, he is doing incredibly well with the Jakafi.  I am just very happy that he has had no side effects with Jakafi.  I realize that he is on low-dose Jakafi.  [name], however, is working very nicely.  His splenic enlargement is decreasing.  We did a ultrasound on his spleen today.  His splenic volume went from 2300 cm down to 1400 cm.  He has had no problems with nausea or vomiting.  He has had no rashes.  There is been no diarrhea.  He has had no abdominal pain.  He is over his CVA that he had.  He has no residual weakness.  There is been no chest pain.  There is been no issues with fever.  Has had no leg swelling.  Overall, his performance status is ECOG 1.  Medications:  Allergies as of 12/04/2018   No Known Allergies     Medication List       Accurate as of December 04, 2018  1:44 PM. If you have any questions, ask your nurse or doctor.        acetaminophen 500 MG tablet Commonly known as: TYLENOL Take 1,000 mg by mouth every 6 (six) hours as needed for mild pain.   apixaban 5 MG Tabs tablet Commonly known as: ELIQUIS Take 1 tablet (5 mg total) by mouth 2 (two) times daily.   aspirin 81 MG EC tablet Take 1 tablet (81 mg total) by mouth daily.   atorvastatin 40 MG tablet Commonly known as: LIPITOR Take 1 tablet (40 mg total) by mouth daily at 6 PM.    buPROPion 150 MG 12 hr tablet Commonly known as: WELLBUTRIN SR Take 150 mg by mouth every morning.   fluticasone 50 MCG/ACT nasal spray Commonly known as: FLONASE Place 1 spray into both nostrils daily as needed for allergies.   losartan 25 MG tablet Commonly known as: COZAAR Take 25 mg by mouth 2 (two) times daily. Take two tablets by mouth , total of 50 mg daily.   propranolol ER 160 MG SR capsule Commonly known as: INDERAL LA Take 1 capsule (160 mg total) by mouth daily.   ruxolitinib phosphate 10 MG tablet Commonly known as: JAKAFI Take 1 tablet (10 mg total) by mouth 2 (two) times daily.   zolpidem 12.5 MG CR tablet Commonly known as: AMBIEN CR Take 6.25 mg by mouth at bedtime.       Allergies: No Known Allergies  Past Medical History, Surgical history, Social history, and Family History were reviewed and updated.  Review of Systems: Review of Systems  Constitutional: Negative.   HENT: Negative.   Eyes: Negative.   Respiratory: Negative.   Cardiovascular: Positive for palpitations.  Gastrointestinal: Positive for abdominal pain.  Genitourinary: Negative.   Musculoskeletal: Negative.   Skin: Negative.   Neurological: Negative.   Endo/Heme/Allergies: Negative.   Psychiatric/Behavioral:  Negative.      Physical Exam:  weight is 195 lb (88.5 kg). His oral temperature is 97.8 F (36.6 C). His blood pressure is 123/69 and his pulse is 58 (abnormal). His respiration is 17 and oxygen saturation is 100%.   Wt Readings from Last 3 Encounters:  12/04/18 195 lb (88.5 kg)  10/23/18 197 lb (89.4 kg)  10/22/18 193 lb (87.5 kg)    Physical Exam Vitals signs reviewed.  HENT:     Head: Normocephalic and atraumatic.  Eyes:     Pupils: Pupils are equal, round, and reactive to light.  Neck:     Musculoskeletal: Normal range of motion.  Cardiovascular:     Rate and Rhythm: Normal rate and regular rhythm.     Heart sounds: Normal heart sounds.  Pulmonary:      Effort: Pulmonary effort is normal.     Breath sounds: Normal breath sounds.  Abdominal:     General: Bowel sounds are normal.     Palpations: Abdomen is soft.     Comments: Abdominal exam shows a soft abdomen with good bowel sounds.  He has no fluid wave.  There is no guarding or rebound tenderness.  There is no obvious hepatomegaly.  His spleen tip does feel about 2cm below the left costal margin.  Musculoskeletal: Normal range of motion.        General: No tenderness or deformity.  Lymphadenopathy:     Cervical: No cervical adenopathy.  Skin:    General: Skin is warm and dry.     Findings: No erythema or rash.  Neurological:     Mental Status: He is alert and oriented to person, place, and time.  Psychiatric:        Behavior: Behavior normal.        Thought Content: Thought content normal.        Judgment: Judgment normal.      Lab Results  Component Value Date   WBC 9.3 12/04/2018   HGB 10.5 (L) 12/04/2018   HCT 32.0 (L) 12/04/2018   MCV 86.7 12/04/2018   PLT 290 12/04/2018   Lab Results  Component Value Date   FERRITIN 88 10/23/2018   IRON 156 10/23/2018   TIBC 378 10/23/2018   UIBC 222 10/23/2018   IRONPCTSAT 41 10/23/2018   Lab Results  Component Value Date   RETICCTPCT 3.2 (H) 12/04/2018   RBC 3.69 (L) 12/04/2018   RETICCTABS 87.6 02/21/2011   No results found for: KPAFRELGTCHN, LAMBDASER, KAPLAMBRATIO No results found for: Kandis Cocking, IGMSERUM No results found for: Odetta Pink, SPEI   Chemistry      Component Value Date/Time   NA 144 12/04/2018 1154   NA 144 04/25/2017 1506   NA 139 04/12/2016 1113   K 4.1 12/04/2018 1154   K 4.2 04/25/2017 1506   K 4.1 04/12/2016 1113   CL 108 12/04/2018 1154   CL 105 04/25/2017 1506   CO2 29 12/04/2018 1154   CO2 29 04/25/2017 1506   CO2 22 04/12/2016 1113   BUN 20 12/04/2018 1154   BUN 17 04/25/2017 1506   BUN 16.3 04/12/2016 1113   CREATININE 0.93  12/04/2018 1154   CREATININE 1.2 04/25/2017 1506   CREATININE 0.9 04/12/2016 1113      Component Value Date/Time   CALCIUM 8.7 (L) 12/04/2018 1154   CALCIUM 9.5 04/25/2017 1506   CALCIUM 9.1 04/12/2016 1113   ALKPHOS 67 12/04/2018 1154   ALKPHOS 73  04/25/2017 1506   ALKPHOS 66 04/12/2016 1113   AST 31 12/04/2018 1154   AST 31 04/12/2016 1113   ALT 28 12/04/2018 1154   ALT 58 (H) 04/25/2017 1506   ALT 36 04/12/2016 1113   BILITOT 1.3 (H) 12/04/2018 1154   BILITOT 0.73 04/12/2016 1113       Impression and Plan: Mr. Magnussen is a very pleasant 63 yo caucasian gentleman with polycythemia vera, JAK2 positive.   I am just very pleased that the [facility], at the low-dose, is doing well for him.  I will not adjust the dose of the [facility] for right now.  I will plan for another ultrasound of his abdomen probably in about 3 months.  He does not need to be phlebotomized.  I would be surprised if he needed any phlebotomies while on Jakafi.  I will see him back in 6 weeks.    [name], MD 7/30/20201:44 PM

## 2018-12-05 LAB — IRON AND TIBC
Iron: 127 ug/dL (ref 42–163)
Saturation Ratios: 37 % (ref 20–55)
TIBC: 346 ug/dL (ref 202–409)
UIBC: 219 ug/dL (ref 117–376)

## 2018-12-05 LAB — FERRITIN: Ferritin: 122 ng/mL (ref 24–336)

## 2018-12-08 MED FILL — JAKAFI 10 MG TABLET: 10 | 30 days supply | Qty: 60 | Fill #2

## 2018-12-29 ENCOUNTER — Encounter: Payer: Self-pay | Admitting: Adult Health

## 2018-12-29 ENCOUNTER — Other Ambulatory Visit: Payer: Self-pay

## 2018-12-29 ENCOUNTER — Ambulatory Visit: Payer: Managed Care, Other (non HMO) | Admitting: Adult Health

## 2018-12-29 VITALS — BP 120/69 | HR 54 | Temp 97.5°F | Ht 72.0 in | Wt 197.0 lb

## 2018-12-29 DIAGNOSIS — I480 Paroxysmal atrial fibrillation: Secondary | ICD-10-CM | POA: Diagnosis not present

## 2018-12-29 DIAGNOSIS — I10 Essential (primary) hypertension: Secondary | ICD-10-CM

## 2018-12-29 DIAGNOSIS — M792 Neuralgia and neuritis, unspecified: Secondary | ICD-10-CM

## 2018-12-29 DIAGNOSIS — E782 Mixed hyperlipidemia: Secondary | ICD-10-CM

## 2018-12-29 DIAGNOSIS — I639 Cerebral infarction, unspecified: Secondary | ICD-10-CM | POA: Diagnosis not present

## 2018-12-29 DIAGNOSIS — M79604 Pain in right leg: Secondary | ICD-10-CM

## 2018-12-29 DIAGNOSIS — M79605 Pain in left leg: Secondary | ICD-10-CM

## 2018-12-29 DIAGNOSIS — R5383 Other fatigue: Secondary | ICD-10-CM

## 2018-12-29 MED ORDER — GABAPENTIN 300 MG PO CAPS: 300.0000 mg | ORAL_CAPSULE | Freq: Every day | ORAL | 2 refills | Status: DC

## 2018-12-29 NOTE — Patient Instructions (Signed)
Order placed to undergo EMG/nerve conduction study to assess for potential underlying conditions which could be causing your lower extremity nerve pain  Recommend starting gabapentin 300 mg nightly and call after 1 week if increase needed or with any potential side effects (additional information provided below)  Referral placed for sleep study for evaluation of potential underlying sleep apnea  Continue aspirin 81 mg daily and Eliquis (apixaban) daily  and Lipitor for secondary stroke prevention  Continue to follow up with PCP regarding cholesterol and blood pressure management   Continue to monitor blood pressure at home  Maintain strict control of hypertension with blood pressure goal below 130/90, diabetes with hemoglobin A1c goal below 6.5% and cholesterol with LDL cholesterol (bad cholesterol) goal below 70 mg/dL. I also advised the patient to eat a healthy diet with plenty of whole grains, cereals, fruits and vegetables, exercise regularly and maintain ideal body weight.  Followup in the future with me in 3 months or call earlier if needed       Thank you for coming to see [place] at [facility]. I hope we have been able to provide you high quality care today.  You may receive a patient satisfaction survey over the next few weeks. We would appreciate your feedback and comments so that we may continue to improve ourselves and the health of our patients.     Electromyoneurogram Electromyoneurogram is a test to check how well your muscles and nerves are working. This procedure includes the combined use of electromyogram (EMG) and nerve conduction study (NCS). EMG is used to look for muscular disorders. NCS, which is also called electroneurogram, measures how well your nerves are controlling your muscles. The procedures are usually done together to check if your muscles and nerves are healthy. If the results of the tests are abnormal, this may indicate disease or  injury, such as a neuromuscular disease or peripheral nerve damage. Tell a health care provider about:  Any allergies you have.  All medicines you are taking, including vitamins, herbs, eye drops, creams, and over-the-counter medicines.  Any problems you or family members have had with anesthetic medicines.  Any blood disorders you have.  Any surgeries you have had.  Any medical conditions you have.  If you have a pacemaker.  Whether you are pregnant or may be pregnant. What are the risks? Generally, this is a safe procedure. However, problems may occur, including:  Infection where the electrodes were inserted.  Bleeding. What happens before the procedure? Medicines Ask your health care provider about:  Changing or stopping your regular medicines. This is especially important if you are taking diabetes medicines or blood thinners.  Taking medicines such as aspirin and ibuprofen. These medicines can thin your blood. Do not take these medicines unless your health care provider tells you to take them.  Taking over-the-counter medicines, vitamins, herbs, and supplements. General instructions  Your health care provider may ask you to avoid: ? Beverages that have caffeine, such as coffee and tea. ? Any products that contain nicotine or tobacco. These products include cigarettes, e-cigarettes, and chewing tobacco. If you need help quitting, ask your health care provider.  Do not use lotions or creams on the same day that you will be having the procedure. What happens during the procedure? For EMG   Your health care provider will ask you to stay in a position so that he or she can access the muscle that will be studied. You may be standing, sitting, or lying  down.  You may be given a medicine that numbs the area (local anesthetic).  A very thin needle that has an electrode will be inserted into your muscle.  Another small electrode will be placed on your skin near the  muscle.  Your health care provider will ask you to continue to remain still.  The electrodes will send a signal that tells about the electrical activity of your muscles. You may see this on a monitor or hear it in the room.  After your muscles have been studied at rest, your health care provider will ask you to contract or flex your muscles. The electrodes will send a signal that tells about the electrical activity of your muscles.  Your health care provider will remove the electrodes and the electrode needles when the procedure is finished. The procedure may vary among health care providers and hospitals. For NCS   An electrode that records your nerve activity (recording electrode) will be placed on your skin by the muscle that is being studied.  An electrode that is used as a reference (reference electrode) will be placed near the recording electrode.  A paste or gel will be applied to your skin between the recording electrode and the reference electrode.  Your nerve will be stimulated with a mild shock. Your health care provider will measure how much time it takes for your muscle to react.  Your health care provider will remove the electrodes and the gel when the procedure is finished. The procedure may vary among health care providers and hospitals. What happens after the procedure?  It is up to you to get the results of your procedure. Ask your health care provider, or the department that is doing the procedure, when your results will be ready.  Your health care provider may: ? Give you medicines for any pain. ? Monitor the insertion sites to make sure that bleeding stops. Summary  Electromyoneurogram is a test to check how well your muscles and nerves are working.  If the results of the tests are abnormal, this may indicate disease or injury.  This is a safe procedure. However, problems may occur, such as bleeding and infection.  Your health care provider will do two tests  to complete this procedure. One checks your muscles (EMG) and another checks your nerves (NCS).  It is up to you to get the results of your procedure. Ask your health care provider, or the department that is doing the procedure, when your results will be ready. This information is not intended to replace advice given to you by your health care provider. Make sure you discuss any questions you have with your health care provider. Document Released: 08/24/2004 Document Revised: 01/07/2018 Document Reviewed: 12/20/2017 Elsevier Patient Education  [place].     Gabapentin capsules or tablets What is this medicine? GABAPENTIN (GA ba pen tin) is used to control seizures in certain types of epilepsy. It is also used to treat certain types of nerve pain. This medicine may be used for other purposes; ask your health care provider or pharmacist if you have questions. COMMON BRAND NAME(S): Active-PAC with Gabapentin, Gabarone, Neurontin What should I tell my health care provider before I take this medicine? They need to know if you have any of these conditions:  history of drug abuse or alcohol abuse problem  kidney disease  lung or breathing disease  suicidal thoughts, plans, or attempt; a previous suicide attempt by you or a family member  an unusual  or allergic reaction to gabapentin, other medicines, foods, dyes, or preservatives  pregnant or trying to get pregnant  breast-feeding How should I use this medicine? Take this medicine by mouth with a glass of water. Follow the directions on the prescription label. You can take it with or without food. If it upsets your stomach, take it with food. Take your medicine at regular intervals. Do not take it more often than directed. Do not stop taking except on your doctor's advice. If you are directed to break the 600 or 800 mg tablets in half as part of your dose, the extra half tablet should be used for the next dose. If you have not  used the extra half tablet within 28 days, it should be thrown away. A special MedGuide will be given to you by the pharmacist with each prescription and refill. Be sure to read this information carefully each time. Talk to your pediatrician regarding the use of this medicine in children. While this drug may be prescribed for children as young as 3 years for selected conditions, precautions do apply. Overdosage: If you think you have taken too much of this medicine contact a poison control center or emergency room at once. NOTE: This medicine is only for you. Do not share this medicine with others. What if I miss a dose? If you miss a dose, take it as soon as you can. If it is almost time for your next dose, take only that dose. Do not take double or extra doses. What may interact with this medicine? This medicine may interact with the following medications:  alcohol  antihistamines for allergy, cough, and cold  certain medicines for anxiety or sleep  certain medicines for depression like amitriptyline, fluoxetine, sertraline  certain medicines for seizures like phenobarbital, primidone  certain medicines for stomach problems  general anesthetics like halothane, isoflurane, methoxyflurane, propofol  local anesthetics like lidocaine, pramoxine, tetracaine  medicines that relax muscles for surgery  narcotic medicines for pain  phenothiazines like chlorpromazine, mesoridazine, prochlorperazine, thioridazine This list may not describe all possible interactions. Give your health care provider a list of all the medicines, herbs, non-prescription drugs, or dietary supplements you use. Also tell them if you smoke, drink alcohol, or use illegal drugs. Some items may interact with your medicine. What should I watch for while using this medicine? Visit your doctor or health care provider for regular checks on your progress. You may want to keep a record at home of how you feel your condition is  responding to treatment. You may want to share this information with your doctor or health care provider at each visit. You should contact your doctor or health care provider if your seizures get worse or if you have any new types of seizures. Do not stop taking this medicine or any of your seizure medicines unless instructed by your doctor or health care provider. Stopping your medicine suddenly can increase your seizures or their severity. This medicine may cause serious skin reactions. They can happen weeks to months after starting the medicine. Contact your health care provider right away if you notice fevers or flu-like symptoms with a rash. The rash may be red or purple and then turn into blisters or peeling of the skin. Or, you might notice a red rash with swelling of the face, lips or lymph nodes in your neck or under your arms. Wear a medical identification bracelet or chain if you are taking this medicine for seizures, and carry a  card that lists all your medications. You may get drowsy, dizzy, or have blurred vision. Do not drive, use machinery, or do anything that needs mental alertness until you know how this medicine affects you. To reduce dizzy or fainting spells, do not sit or stand up quickly, especially if you are an older patient. Alcohol can increase drowsiness and dizziness. Avoid alcoholic drinks. Your mouth may get dry. Chewing sugarless gum or sucking hard candy, and drinking plenty of water will help. The use of this medicine may increase the chance of suicidal thoughts or actions. Pay special attention to how you are responding while on this medicine. Any worsening of mood, or thoughts of suicide or dying should be reported to your health care provider right away. Women who become pregnant while using this medicine may enroll in the [place] Pregnancy Registry by calling [phone]. This registry collects information about the safety of antiepileptic drug  use during pregnancy. What side effects may I notice from receiving this medicine? Side effects that you should report to your doctor or health care professional as soon as possible:  allergic reactions like skin rash, itching or hives, swelling of the face, lips, or tongue  breathing problems  rash, fever, and swollen lymph nodes  redness, blistering, peeling or loosening of the skin, including inside the mouth  suicidal thoughts, mood changes Side effects that usually do not require medical attention (report to your doctor or health care professional if they continue or are bothersome):  dizziness  drowsiness  headache  nausea, vomiting  swelling of ankles, feet, hands  tiredness This list may not describe all possible side effects. Call your doctor for medical advice about side effects. You may report side effects to FDA at 1-800-FDA-1088. Where should I keep my medicine? Keep out of reach of children. This medicine may cause accidental overdose and death if it taken by other adults, children, or pets. Mix any unused medicine with a substance like cat litter or coffee grounds. Then throw the medicine away in a sealed container like a sealed bag or a coffee can with a lid. Do not use the medicine after the expiration date. Store at room temperature between 15 and 30 degrees C (59 and 86 degrees F). NOTE: This sheet is a summary. It may not cover all possible information. If you have questions about this medicine, talk to your doctor, pharmacist, or health care provider.  2020 Elsevier/Gold Standard (2018-07-25 14:16:43)

## 2018-12-29 NOTE — Progress Notes (Signed)
[facility] [address] [place]. [place] [address] [phone]       OFFICE FOLLOW UP NOTE  [name] Date of Birth:  07-27-1955 Medical Record Number:  [id number]   Reason for Referral:  hospital stroke follow up   CHIEF COMPLAINT:  Chief Complaint  Patient presents with   Follow-up    2 mon f/u. Alone. [place] room. Patient has some concerns about tingling and slight numbness in his extremities.     HPI: Stroke admission 09/14/2018: [name] [name] is a 63 y.o. male with history of polycythemia, hypertension, PVCs, brief PAT and paroxysmal atrial fibrillation seen on monitor in February 2020-on Eliquis,   who presented with right upper and lower extremity paresthesias.  He did not receive IV t-PA due to anticoagulation and late presentation (>4.5 hours from time of onset).  Stroke work-up revealed subcentimeter acute/late subacute infarction within the cortex of the left superior precentral gyrus likely cardioembolic.  Recommended continuation of Eliquis and potentially adding aspirin 81 mg which was recommended to be decided by cardiology.  HTN stable.  LDL 76 and recommended atorvastatin 40 mg daily.  Other stroke risk factors include advanced age, EtOH use, history of stroke and atrial fibrillation.  He was discharged home in stable condition without therapy needs.  10/28/2018 virtual visit: he has been stable from a stroke standpoint without residual deficits  He has had 2 additional episodes of right hand and foot cold sensation which lasted approximately 1 hour -denies weakness, numbness or tingling Decreased energy level which is slowly improving ?? 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  He has been doing well from a stroke standpoint without residual deficits.  He does come in today with a long list of chronic complaints including tingling/burning sensation in feet bilaterally with tight/cramping sensation.  Tingling/numbness right hand, puffiness under left eye, odd feelings or sensations with at times lightheaded lasting for less than 1 minute and then resolve, occasional balance difficulties, occasional headaches, and decreased concentration/focus towards end of the day.  All symptoms have been present over the past several years and denies worsening with recent stroke.  He has not had prior work-up regarding numbness/tingling or nerve pain complaints.  He has not previously been on medication therapy for nerve pain.  Pain is typically worsened at night while he is sitting and relaxing.  He also endorses occasional cold sensation of lower extremities and right hand.  He does take Ambien 6.25 mg and melatonin 5 mg nightly due to insomnia.  He will occasionally wake up throughout the night with at times difficulty returning to sleep.  He does endorse daytime fatigue but does not typically stop to take a nap.  He has not previously underwent sleep study.  He continues on Eliquis 5 mg twice daily and aspirin half tab daily due to experiencing  bright red blood with bowel movements and lasted for approximately 2 to 3 days approximately 3 weeks ago.  He stopped use of aspirin initially with resolution of symptoms and restarted half tab without recurrent symptoms.  Continues on aspirin from  cardiac standpoint.  Continues on atorvastatin 40 mg daily.  Blood pressure today 120/69.  No further concerns at this time.   ROS:   14 system review of systems performed and negative with exception of see HPI  PMH:  Past Medical History:  Diagnosis Date   Diverticulosis of colon (without mention of hemorrhage)    Hemorrhoids    Hypertension    IBS (irritable bowel syndrome)    Iron deficiency anemia due to chronic blood loss 09/26/2017   Irregular heartbeat    Nephrolithiasis    Other and unspecified hyperlipidemia    Perirectal fistula    Polycythemia, secondary    Stroke (HCC)    Stye    blocker duct right eye lid    PSH:  Past Surgical History:  Procedure Laterality Date   RETINAL DETACHMENT SURGERY     ROTATOR CUFF REPAIR     rt.   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: Not on file   Food insecurity    Worry: Not on file    Inability: Not on file   Transportation needs    Medical: Not on file    Non-medical: Not on file  Tobacco Use   Smoking status: Never Smoker   Smokeless tobacco: Never Used  Substance and Sexual Activity   Alcohol use: Yes    Alcohol/week: 0.0 standard drinks    Comment: Occasional   Drug use: No   Sexual activity: Not on file  Lifestyle   Physical activity    Days per week: Not on file    Minutes per session: Not on file   Stress: Not on file  Relationships   Social connections    Talks on phone: Not on file    Gets together: Not on file    Attends religious service: Not on file    Active member of club or organization: Not on file    Attends meetings of clubs or organizations: Not on file      Relationship status: Not on file   Intimate partner violence    Fear of current or ex partner: Not on file    Emotionally abused: Not on file    Physically abused: Not on file    Forced sexual activity: Not on file  Other Topics Concern   Not on file  Social History Narrative   Not on file    Family History:  Family History  Problem Relation Age of Onset   Aortic aneurysm Father    Heart attack Brother    Colon cancer Neg Hx    Esophageal cancer Neg Hx    Pancreatic cancer Neg Hx    Prostate cancer Neg Hx    Rectal cancer Neg Hx    Stomach cancer Neg Hx     Medications:   Current Outpatient Medications on File Prior to Visit  Medication Sig  Dispense Refill   acetaminophen (TYLENOL) 500 MG tablet Take 1,000 mg by mouth every 6 (six) hours as needed for mild pain.      apixaban (ELIQUIS) 5 MG TABS tablet Take 1 tablet (5 mg total) by mouth 2 (two) times daily. 60 tablet 6   aspirin EC 81 MG EC tablet Take 1 tablet (81 mg total) by mouth daily. 60 tablet 0   atorvastatin (LIPITOR) 40 MG tablet Take 1 tablet (40 mg total) by mouth daily at 6 PM. 60 tablet 0   buPROPion (WELLBUTRIN SR) 150 MG 12 hr tablet Take 150 mg by mouth every morning.  12   losartan (COZAAR) 25 MG tablet Take 25 mg by mouth 2 (two) times daily. Take two tablets by mouth , total of 50 mg daily.     Multiple Vitamins-Minerals (MENS MULTIVITAMIN) TABS Take 1 tablet by mouth daily.     propranolol ER (INDERAL LA) 160 MG SR capsule Take 1 capsule (160 mg total) by mouth daily. 90 capsule 3   ruxolitinib phosphate (JAKAFI) 10 MG tablet Take 1 tablet (10 mg total) by mouth 2 (two) times daily. 60 tablet 4   zolpidem (AMBIEN CR) 12.5 MG CR tablet Take 6.25 mg by mouth at bedtime.      fluticasone (FLONASE) 50 MCG/ACT nasal spray Place 1 spray into both nostrils daily as needed for allergies.      No current facility-administered medications on file prior to visit.     Allergies:  No Known  Allergies   Physical Exam  Today's Vitals   12/29/18 1533  BP: 120/69  Pulse: (!) 54  Temp: (!) 97.5 F (36.4 C)  TempSrc: Oral  Weight: 197 lb (89.4 kg)  Height: 6' (1.829 m)   Body mass index is 26.72 kg/m.  General: well developed, well nourished,  pleasant middle-age Caucasian male, seated, in no evident distress Head: head normocephalic and atraumatic.   Neck: supple with no carotid or supraclavicular bruits Cardiovascular: regular rate and rhythm, no murmurs Musculoskeletal: no deformity Skin:  no rash/petichiae Vascular:  Normal pulses all extremities   Neurologic Exam Mental Status: Awake and fully alert. Oriented to place and time. Recent and remote memory intact. Attention span, concentration and fund of knowledge appropriate. Mood and affect appropriate.  Cranial Nerves: Fundoscopic exam reveals sharp disc margins. Pupils equal, briskly reactive to light. Extraocular movements full without nystagmus. Visual fields full to confrontation. Hearing intact. Facial sensation intact. Face, tongue, palate moves normally and symmetrically.  Motor: Normal bulk and tone. Normal strength in all tested extremity muscles. Sensory.: intact to touch , pinprick , position and vibratory sensation.  Coordination: Rapid alternating movements normal in all extremities. Finger-to-nose and heel-to-shin performed accurately bilaterally. Gait and Station: Arises from chair without difficulty. Stance is normal. Gait demonstrates normal stride length and balance Reflexes: 1+ and symmetric. Toes downgoing.      Diagnostic Data (Labs, Imaging, Testing)  [name] Contrast 09/13/2018 IMPRESSION:  1. Subcentimeter acute/early subacute infarction within the cortex of left superior precentral gyrus. No associated hemorrhage or mass effect.  2. Increased susceptibility signal appears associated with blood vessel in the left posterior sylvian fissure of uncertain significance, possibly hyperemia  associated with recent ischemia given adjacent infarction. No vascular malformation is present this location prior MRA of the head. Multiple thromboemboli or subarachnoid hemorrhage is considered unlikely in the absence of secondary findings.  3. Mild chronic microvascular ischemic changes of the brain. Very small chronic infarction in the right  cerebellar hemisphere.    Ct Angio Head W Or Wo Contrast Ct Angio Neck W Or Wo Contrast  09/13/2018 IMPRESSION:  1. Left-greater-than-right cervical ICA ectasia measuring up to 9 mm diameter on the left. Left upper ICA small posterior directed sacculation/penetrating ulcer at C2 level. Multiple segments of stenosis in the intracranial circulation including a beaded appearance in the small vessels of the anterior circulation. Relatively mild atherosclerotic disease. 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  2. No dissection, large vessel occlusion, aneurysm, or vascular malformation identified. No high-grade stenosis in the neck.    Ct Head Wo Contrast 09/12/2018 IMPRESSION:  Mild atrophic changes without acute abnormality.    Dg Chest 2 View 09/12/2018 IMPRESSION:  No active cardiopulmonary disease.    Transthoracic Echocardiogram  Normal ejection fraction of 55 to 60%.  No cardiac source of embolism.   EKG - SB rate 58 BPM. (See cardiology reading for complete details)     ASSESSMENT: Colbert Burek is a 62 y.o. 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 Vascular risk factors include polycythemia, HTN, HLD, atrial fibrillation and prior stroke.  Recovered well from a stroke standpoint with residual daytime fatigue but does have multiple complaints today.  Today's visit address neuropathic pains which has been present bilateral lower extremities and right hand  along with ongoing daytime fatigue.    PLAN:  1. Cardioembolic stroke : Continue aspirin 81 mg daily and Eliquis (apixaban) daily  and lipitor 40mg   for secondary stroke prevention.  Advised to restart full dose aspirin and to follow-up with cardiology if experiences bleeding again but likely secondary to hemorrhoids.  Maintain strict control of hypertension with blood pressure goal below 130/90, diabetes with hemoglobin A1c goal below 6.5% and cholesterol with LDL cholesterol (bad cholesterol) goal below 70 mg/dL.  I also advised the patient to eat a healthy diet with plenty of whole grains, cereals, fruits and vegetables, exercise regularly with at least 30 minutes of continuous activity daily and maintain ideal body weight. 2. HTN: Advised to continue current treatment regimen.  Advised to continue to monitor at home along with continued follow-up with PCP for management 3. HLD: Advised to continue current treatment regimen along with continued follow-up with PCP for future prescribing and monitoring of lipid panel 4. Atrial fibrillation: Ongoing follow-up with cardiology for Eliquis and atrial fibrillation management 5. Post stroke fatigue: Referral placed to [facility] for further evaluation for potential underlying sleep disorder 6. Neuropathic pains: Recommend EMG/NCS to assess for underlying etiology causing symptoms.  Also initiated gabapentin 300 mg nightly with discussion of potential side effects and call office after 1 week with potential need of increase 7. Advised to continue to monitor lightheaded sensation and headaches which have been ongoing for multiple years without worsening.  Advised to follow-up with PCP with any worsening or further evaluation.  Also discussed underlying anxiety which could also be worsening the symptoms   Follow up in 3 months or call earlier if needed   Greater than 50% of time during this 30 minute visit was spent on counseling, along with  discussion of multiple symptoms, reviewing risk factor management of HTN, HLD and atrial fibrillation, planning of further management along with potential future management, and discussion with patient and family answering all questions.    Venancio Poisson, AGNP-BC  [facility] [address] [place],  [place] [address]  Phone [phone] Fax [phone] Note: This document was prepared with digital dictation and possible smart phrase technology. Any transcriptional errors that result from this process are unintentional.

## 2018-12-30 NOTE — Progress Notes (Signed)
I agree with the above plan 

## 2019-01-06 MED FILL — JAKAFI 10 MG TABLET: 10 | 30 days supply | Qty: 60 | Fill #3

## 2019-01-07 ENCOUNTER — Telehealth: Payer: Self-pay | Admitting: Adult Health

## 2019-01-07 NOTE — Telephone Encounter (Signed)
I called patient and LVM to schedule NCV/EMG. Patient can be scheduled with any provider who performs EMG's.

## 2019-01-13 ENCOUNTER — Other Ambulatory Visit: Payer: Self-pay | Admitting: Hematology & Oncology

## 2019-01-13 NOTE — Progress Notes (Deleted)
HPI: FU PAF. Stress echocardiogram June 2011 normal. TSH at that time also normal. Monitor February 2020 revealed sinus bradycardia, NSR, sinus tach, pacs, PVC, brief PAT and PAF.Echo May 2020 showed normal LV function, mild diastolic dysfunction, mild left ventricular hypertrophy and negative saline microcavitation study.Admitted with CVA May 2020. 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 Aspirin added to apixaban by neurologyas CVA occurred on apixaban. Patient was seen in follow-up by Dr. Marin Olp and it was felt that polycythemia may be contributing to his recent CVA.  Since last seen  Current Outpatient Medications  Medication Sig Dispense Refill  . acetaminophen (TYLENOL) 500 MG tablet Take 1,000 mg by mouth every 6 (six) hours as needed for mild pain.     Marland Kitchen anagrelide (AGRYLIN) 1 MG capsule TAKE 2 CAPSULES (2 MG TOTAL) BY MOUTH DAILY. 180 capsule 1  . apixaban (ELIQUIS) 5 MG TABS tablet Take 1 tablet (5 mg total) by mouth 2 (two) times daily. 60 tablet 6  . aspirin EC 81 MG EC tablet Take 1 tablet (81 mg total) by mouth daily. 60 tablet 0  . atorvastatin (LIPITOR) 40 MG tablet Take 1 tablet (40 mg total) by mouth daily at 6 PM. 60 tablet 0  . buPROPion (WELLBUTRIN SR) 150 MG 12 hr tablet Take 150 mg by mouth every morning.  12  . fluticasone (FLONASE) 50 MCG/ACT nasal spray Place 1 spray into both nostrils daily as needed for allergies.     Marland Kitchen gabapentin (NEURONTIN) 300 MG capsule Take 1 capsule (300 mg total) by mouth at bedtime. 30 capsule 2  . losartan (COZAAR) 25 MG tablet Take 25 mg by mouth 2 (two) times daily. Take two tablets by mouth , total of 50 mg daily.    . Multiple Vitamins-Minerals (MENS MULTIVITAMIN) TABS Take 1 tablet by mouth daily.    . propranolol ER (INDERAL LA) 160 MG SR capsule  Take 1 capsule (160 mg total) by mouth daily. 90 capsule 3  . ruxolitinib phosphate (JAKAFI) 10 MG tablet Take 1 tablet (10 mg total) by mouth 2 (two) times daily. 60 tablet 4  . zolpidem (AMBIEN CR) 12.5 MG CR tablet Take 6.25 mg by mouth at bedtime.      No current facility-administered medications for this visit.      Past Medical History:  Diagnosis Date  . Diverticulosis of colon (without mention of hemorrhage)   . Hemorrhoids   . Hypertension   . IBS (irritable bowel syndrome)   . Iron deficiency anemia due to chronic blood loss 09/26/2017  . Irregular heartbeat   . Nephrolithiasis   . Other and unspecified hyperlipidemia   . Perirectal fistula   . Polycythemia, secondary   . Stroke ([place])   . Stye    blocker duct right eye lid    Past Surgical History:  Procedure Laterality Date  . RETINAL DETACHMENT SURGERY    . ROTATOR CUFF REPAIR     rt.  Marland Kitchen VASECTOMY      Social History   Socioeconomic History  . Marital status: Married    Spouse name: Not on file  . Number of children: 2  . Years of education: Not on file  . Highest education level: Not on file  Occupational History  . Occupation: Lobbyist: [employer]  Social Needs  . Emergency planning/management officer  strain: Not on file  . Food insecurity    Worry: Not on file    Inability: Not on file  . Transportation needs    Medical: Not on file    Non-medical: Not on file  Tobacco Use  . Smoking status: Never Smoker  . Smokeless tobacco: Never Used  Substance and Sexual Activity  . Alcohol use: Yes    Alcohol/week: 0.0 standard drinks    Comment: Occasional  . Drug use: No  . Sexual activity: Not on file  Lifestyle  . Physical activity    Days per week: Not on file    Minutes per session: Not on file  . Stress: Not on file  Relationships  . Social Herbalist on phone: Not on file    Gets together: Not on file    Attends religious service: Not on file    Active member of club or  organization: Not on file    Attends meetings of clubs or organizations: Not on file    Relationship status: Not on file  . Intimate partner violence    Fear of current or ex partner: Not on file    Emotionally abused: Not on file    Physically abused: Not on file    Forced sexual activity: Not on file  Other Topics Concern  . Not on file  Social History Narrative  . Not on file    Family History  Problem Relation Age of Onset  . Aortic aneurysm Father   . Heart attack Brother   . Colon cancer Neg Hx   . Esophageal cancer Neg Hx   . Pancreatic cancer Neg Hx   . Prostate cancer Neg Hx   . Rectal cancer Neg Hx   . Stomach cancer Neg Hx     ROS: no fevers or chills, productive cough, hemoptysis, dysphasia, odynophagia, melena, hematochezia, dysuria, hematuria, rash, seizure activity, orthopnea, PND, pedal edema, claudication. Remaining systems are negative.  Physical Exam: Well-developed well-nourished in no acute distress.  Skin is warm and dry.  HEENT is normal.  Neck is supple.  Chest is clear to auscultation with normal expansion.  Cardiovascular exam is regular rate and rhythm.  Abdominal exam nontender or distended. No masses palpated. Extremities show no edema. neuro grossly intact  ECG- personally reviewed  A/P  1 paroxysmal atrial fibrillation-patient is in sinus rhythm today.  We will continue with beta-blocker for rate control if atrial fibrillation recurs.  Continue apixaban.  Aspirin was also added as prior CVA occurred on apixaban alone.  2 hypertension-patient's blood pressure is controlled today.  Continue present regimen.  3 hyperlipidemia-managed by primary care.  4 prior CVA-patient will need lifelong anticoagulation.  5 polycythemia vera-followed by hematology/oncology.  Kirk Ruths, MD

## 2019-01-14 ENCOUNTER — Ambulatory Visit: Payer: Managed Care, Other (non HMO) | Admitting: Cardiology

## 2019-01-21 ENCOUNTER — Inpatient Hospital Stay: Payer: Managed Care, Other (non HMO) | Attending: Hematology & Oncology

## 2019-01-21 ENCOUNTER — Other Ambulatory Visit: Payer: Self-pay | Admitting: *Deleted

## 2019-01-21 ENCOUNTER — Inpatient Hospital Stay (HOSPITAL_BASED_OUTPATIENT_CLINIC_OR_DEPARTMENT_OTHER): Payer: Managed Care, Other (non HMO) | Admitting: Hematology & Oncology

## 2019-01-21 ENCOUNTER — Encounter: Payer: Self-pay | Admitting: Hematology & Oncology

## 2019-01-21 ENCOUNTER — Other Ambulatory Visit: Payer: Self-pay

## 2019-01-21 VITALS — BP 124/69 | HR 66 | Temp 96.9°F | Resp 16

## 2019-01-21 DIAGNOSIS — D500 Iron deficiency anemia secondary to blood loss (chronic): Secondary | ICD-10-CM

## 2019-01-21 DIAGNOSIS — R8290 Unspecified abnormal findings in urine: Secondary | ICD-10-CM

## 2019-01-21 DIAGNOSIS — F419 Anxiety disorder, unspecified: Secondary | ICD-10-CM | POA: Diagnosis not present

## 2019-01-21 DIAGNOSIS — R109 Unspecified abdominal pain: Secondary | ICD-10-CM | POA: Insufficient documentation

## 2019-01-21 DIAGNOSIS — Z7901 Long term (current) use of anticoagulants: Secondary | ICD-10-CM | POA: Insufficient documentation

## 2019-01-21 DIAGNOSIS — Z7982 Long term (current) use of aspirin: Secondary | ICD-10-CM | POA: Diagnosis not present

## 2019-01-21 DIAGNOSIS — R002 Palpitations: Secondary | ICD-10-CM | POA: Diagnosis not present

## 2019-01-21 DIAGNOSIS — R200 Anesthesia of skin: Secondary | ICD-10-CM | POA: Diagnosis not present

## 2019-01-21 DIAGNOSIS — R945 Abnormal results of liver function studies: Secondary | ICD-10-CM | POA: Insufficient documentation

## 2019-01-21 DIAGNOSIS — I480 Paroxysmal atrial fibrillation: Secondary | ICD-10-CM | POA: Diagnosis not present

## 2019-01-21 DIAGNOSIS — R5383 Other fatigue: Secondary | ICD-10-CM | POA: Insufficient documentation

## 2019-01-21 DIAGNOSIS — Z79899 Other long term (current) drug therapy: Secondary | ICD-10-CM | POA: Insufficient documentation

## 2019-01-21 DIAGNOSIS — D751 Secondary polycythemia: Secondary | ICD-10-CM | POA: Diagnosis not present

## 2019-01-21 DIAGNOSIS — R202 Paresthesia of skin: Secondary | ICD-10-CM | POA: Insufficient documentation

## 2019-01-21 DIAGNOSIS — D45 Polycythemia vera: Secondary | ICD-10-CM | POA: Diagnosis present

## 2019-01-21 DIAGNOSIS — D509 Iron deficiency anemia, unspecified: Secondary | ICD-10-CM | POA: Insufficient documentation

## 2019-01-21 LAB — CMP (CANCER CENTER ONLY)
ALT: 83 U/L — ABNORMAL HIGH (ref 0–44)
AST: 80 U/L — ABNORMAL HIGH (ref 15–41)
Albumin: 4.6 g/dL (ref 3.5–5.0)
Alkaline Phosphatase: 99 U/L (ref 38–126)
Anion gap: 8 (ref 5–15)
BUN: 19 mg/dL (ref 8–23)
CO2: 27 mmol/L (ref 22–32)
Calcium: 9.4 mg/dL (ref 8.9–10.3)
Chloride: 106 mmol/L (ref 98–111)
Creatinine: 0.93 mg/dL (ref 0.61–1.24)
GFR, Est AFR Am: >60 mL/min (ref >60)
GFR, Estimated: >60 mL/min (ref >60)
Glucose, Bld: 116 mg/dL — ABNORMAL HIGH (ref 70–99)
Potassium: 4.1 mmol/L (ref 3.5–5.1)
Sodium: 141 mmol/L (ref 135–145)
Total Bilirubin: 1.6 mg/dL — ABNORMAL HIGH (ref 0.3–1.2)
Total Protein: 6.5 g/dL (ref 6.5–8.1)

## 2019-01-21 LAB — CBC WITH DIFFERENTIAL (CANCER CENTER ONLY)
Abs Immature Granulocytes: 0.54 10*3/uL — ABNORMAL HIGH (ref 0.00–0.07)
Basophils Absolute: 0.2 10*3/uL — ABNORMAL HIGH (ref 0.0–0.1)
Basophils Relative: 2 %
Eosinophils Absolute: 0.3 10*3/uL (ref 0.0–0.5)
Eosinophils Relative: 3 %
HCT: 32.9 % — ABNORMAL LOW (ref 39.0–52.0)
Hemoglobin: 11 g/dL — ABNORMAL LOW (ref 13.0–17.0)
Immature Granulocytes: 5 %
Lymphocytes Relative: 12 %
Lymphs Abs: 1.3 10*3/uL (ref 0.7–4.0)
MCH: 29 pg (ref 26.0–34.0)
MCHC: 33.4 g/dL (ref 30.0–36.0)
MCV: 86.8 fL (ref 80.0–100.0)
Monocytes Absolute: 0.9 10*3/uL (ref 0.1–1.0)
Monocytes Relative: 8 %
Neutro Abs: 7.5 10*3/uL (ref 1.7–7.7)
Neutrophils Relative %: 70 %
Platelet Count: 311 10*3/uL (ref 150–400)
RBC: 3.79 MIL/uL — ABNORMAL LOW (ref 4.22–5.81)
RDW: 17.9 % — ABNORMAL HIGH (ref 11.5–15.5)
WBC Count: 10.7 10*3/uL — ABNORMAL HIGH (ref 4.0–10.5)
nRBC: 1.1 % — ABNORMAL HIGH (ref 0.0–0.2)

## 2019-01-21 LAB — URINALYSIS, COMPLETE (UACMP) WITH MICROSCOPIC
Bilirubin Urine: NEGATIVE
Glucose, UA: NEGATIVE mg/dL
Ketones, ur: NEGATIVE mg/dL
Leukocytes,Ua: NEGATIVE
Nitrite: NEGATIVE
Protein, ur: NEGATIVE mg/dL
Specific Gravity, Urine: >1.03 — ABNORMAL HIGH (ref 1.005–1.030)
pH: 6 (ref 5.0–8.0)

## 2019-01-21 LAB — LACTATE DEHYDROGENASE: LDH: 696 U/L — ABNORMAL HIGH (ref 98–192)

## 2019-01-21 LAB — SAVE SMEAR(SSMR), FOR PROVIDER SLIDE REVIEW

## 2019-01-21 NOTE — Progress Notes (Signed)
Hematology and Oncology Follow Up Visit  [name] [name] [id number] Mar 09, 1956 63 y.o. 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     Interim History:  [name] [name] is here today for follow-up.  Surprisingly, he is having a lot of anxiety.  I just feel bad for him.  He has these unusual symptoms.  He has this tingling and numbness in his legs.  He is supposed to have a nerve conduction study for this.  Hopefully this will be done soon.  He is still working.  He does get fatigued a little bit more quickly.  I noted that his liver function studies were high today.  He is on Lipitor.  I told him to stop the Lipitor.  This might be the etiology.  He is also on Jakafi.  I think the Lipitor would be a bigger factor then [name] [name] for causing elevated liver function studies.  He is also noted some abdominal pain.  This is below the umbilicus.  It is a sharp pain.  He says that he has had occasional blood in the urine.  He did stop the baby aspirin.  I did do a urinalysis on him today.  This had a little bit of bacteria and some blood.  We will send off a urine culture.  He has had no fever.  There is no leg swelling.  He has not noted any palpitations.  He has had no headache.  Overall, his performance status is ECOG 1.   Medications:  Allergies as of 01/21/2019   No Known Allergies     Medication List       Accurate as of January 21, 2019  2:58 PM. If you have any questions, ask your nurse or doctor.        acetaminophen 500 MG tablet Commonly known as: TYLENOL Take 1,000  mg by mouth every 6 (six) hours as needed for mild pain.   anagrelide 1 MG capsule Commonly known as: AGRYLIN TAKE 2 CAPSULES (2 MG TOTAL) BY MOUTH DAILY.   apixaban 5 MG Tabs tablet Commonly known as: ELIQUIS Take 1 tablet (5 mg total) by mouth 2 (two) times daily.   aspirin 81 MG EC tablet Take 1 tablet (81 mg total) by mouth daily.   atorvastatin 40 MG tablet Commonly known as: LIPITOR Take 1 tablet (40 mg total) by mouth daily at 6 PM.   buPROPion 150 MG 12 hr tablet Commonly known as: WELLBUTRIN SR Take 150 mg by mouth every morning.   fluticasone 50 MCG/ACT nasal spray Commonly known as: FLONASE Place 1 spray into both nostrils daily as needed for allergies.   gabapentin 300 MG capsule Commonly known as: NEURONTIN Take 1 capsule (300 mg total) by mouth at bedtime.   losartan 25 MG tablet Commonly known as: COZAAR Take 25 mg by mouth 2 (two) times daily. Take two tablets by mouth , total of 50 mg daily.   Mens Multivitamin Tabs Take 1 tablet by mouth daily.   propranolol ER 160 MG SR capsule Commonly known as: INDERAL LA Take 1 capsule (160  mg total) by mouth daily.   ruxolitinib phosphate 10 MG tablet Commonly known as: JAKAFI Take 1 tablet (10 mg total) by mouth 2 (two) times daily.   zolpidem 12.5 MG CR tablet Commonly known as: AMBIEN CR Take 6.25 mg by mouth at bedtime.       Allergies: No Known Allergies  Past Medical History, Surgical history, Social history, and Family History were reviewed and updated.  Review of Systems: Review of Systems  Constitutional: Negative.   HENT: Negative.   Eyes: Negative.   Respiratory: Negative.   Cardiovascular: Positive for palpitations.  Gastrointestinal: Positive for abdominal pain.  Genitourinary: Negative.   Musculoskeletal: Negative.   Skin: Negative.   Neurological: Negative.   Endo/Heme/Allergies: Negative.   Psychiatric/Behavioral: Negative.      Physical Exam:  oral temperature is 96.9 F  (36.1 C) (abnormal). His blood pressure is 124/69 and his pulse is 66. His respiration is 16 and oxygen saturation is 99%.   Wt Readings from Last 3 Encounters:  12/29/18 197 lb (89.4 kg)  12/04/18 195 lb (88.5 kg)  10/23/18 197 lb (89.4 kg)    Physical Exam Vitals signs reviewed.  HENT:     Head: Normocephalic and atraumatic.  Eyes:     Pupils: Pupils are equal, round, and reactive to light.  Neck:     Musculoskeletal: Normal range of motion.  Cardiovascular:     Rate and Rhythm: Normal rate and regular rhythm.     Heart sounds: Normal heart sounds.  Pulmonary:     Effort: Pulmonary effort is normal.     Breath sounds: Normal breath sounds.  Abdominal:     General: Bowel sounds are normal.     Palpations: Abdomen is soft.     Comments: Abdominal exam shows a soft abdomen with good bowel sounds.  He has no fluid wave.  There is no guarding or rebound tenderness.  There is no obvious hepatomegaly.  His spleen tip does feel about 2cm below the left costal margin.  Musculoskeletal: Normal range of motion.        General: No tenderness or deformity.  Lymphadenopathy:     Cervical: No cervical adenopathy.  Skin:    General: Skin is warm and dry.     Findings: No erythema or rash.  Neurological:     Mental Status: He is alert and oriented to person, place, and time.  Psychiatric:        Behavior: Behavior normal.        Thought Content: Thought content normal.        Judgment: Judgment normal.      Lab Results  Component Value Date   WBC 10.7 (H) 01/21/2019   HGB 11.0 (L) 01/21/2019   HCT 32.9 (L) 01/21/2019   MCV 86.8 01/21/2019   PLT 311 01/21/2019   Lab Results  Component Value Date   FERRITIN 122 12/04/2018   IRON 127 12/04/2018   TIBC 346 12/04/2018   UIBC 219 12/04/2018   IRONPCTSAT 37 12/04/2018   Lab Results  Component Value Date   RETICCTPCT 3.2 (H) 12/04/2018   RBC 3.79 (L) 01/21/2019   RETICCTABS 87.6 02/21/2011   No results found for:  KPAFRELGTCHN, LAMBDASER, KAPLAMBRATIO No results found for: IGGSERUM, IGA, IGMSERUM No results found for: Odetta Pink, SPEI   Chemistry      Component Value Date/Time   NA 141 01/21/2019 1347   NA 144 04/25/2017 1506   NA 139 04/12/2016  1113   K 4.1 01/21/2019 1347   K 4.2 04/25/2017 1506   K 4.1 04/12/2016 1113   CL 106 01/21/2019 1347   CL 105 04/25/2017 1506   CO2 27 01/21/2019 1347   CO2 29 04/25/2017 1506   CO2 22 04/12/2016 1113   BUN 19 01/21/2019 1347   BUN 17 04/25/2017 1506   BUN 16.3 04/12/2016 1113   CREATININE 0.93 01/21/2019 1347   CREATININE 1.2 04/25/2017 1506   CREATININE 0.9 04/12/2016 1113      Component Value Date/Time   CALCIUM 9.4 01/21/2019 1347   CALCIUM 9.5 04/25/2017 1506   CALCIUM 9.1 04/12/2016 1113   ALKPHOS 99 01/21/2019 1347   ALKPHOS 73 04/25/2017 1506   ALKPHOS 66 04/12/2016 1113   AST 80 (H) 01/21/2019 1347   AST 31 04/12/2016 1113   ALT 83 (H) 01/21/2019 1347   ALT 58 (H) 04/25/2017 1506   ALT 36 04/12/2016 1113   BILITOT 1.6 (H) 01/21/2019 1347   BILITOT 0.73 04/12/2016 1113       Impression and Plan: [name] [name] is a very pleasant 63 yo caucasian gentleman with polycythemia vera, JAK2 positive.   I feel confident that the [name] [name] is working.  His spleen still does not seem to be enlarging.  When we see him back in 4 weeks, we will recheck his liver function studies and see how they look.  I would also like to get an abdominal ultrasound on him when we see him back.  I do think the [name] [name] is doing a good job for him.  His platelets have not really increased in number.  He has had stability in his white cells and hemoglobin.  I had to spend about 30 minutes with him today.  We had a lot of issues that we had to go through with him.  I wanted to make sure that we were very thorough with our work-up and explanations to him.      [name], MD 9/16/20202:58 PM

## 2019-01-22 ENCOUNTER — Other Ambulatory Visit: Payer: Self-pay | Admitting: Cardiology

## 2019-01-22 ENCOUNTER — Telehealth: Payer: Self-pay | Admitting: Hematology & Oncology

## 2019-01-22 DIAGNOSIS — D45 Polycythemia vera: Secondary | ICD-10-CM | POA: Diagnosis not present

## 2019-01-22 NOTE — Telephone Encounter (Signed)
84m 89.4kg Scr 0.93 01/21/19 Lovw/crenshaw 10/22/18

## 2019-01-22 NOTE — Telephone Encounter (Signed)
Spoke with patient to confirm 10/12 appts per 9/16 LOS

## 2019-01-23 LAB — URINE CULTURE: Culture: NO GROWTH

## 2019-02-05 MED FILL — JAKAFI 10 MG TABLET: 10 | 30 days supply | Qty: 60 | Fill #4

## 2019-02-09 NOTE — Progress Notes (Signed)
This visit type was conducted due to national recommendations for restrictions regarding the COVID-19 Pandemic (e.g. social distancing) in an effort to limit this patient's exposure and mitigate transmission in our community.  Due to his co-morbid illnesses, this patient is at least at moderate risk for complications without adequate follow up.  This format is felt to be most appropriate for this patient at this time.  All issues noted in this document were discussed and addressed.  A limited physical exam was performed with this format.  Please refer to the patient's chart for his consent to telehealth for [facility].    HPI: FU PAF. Stress echocardiogram June 2011 normal. TSH at that time also normal. Monitor February 2020 revealed sinus bradycardia, NSR, sinus tach, pacs, PVC, brief PAT and PAF.Echo May 2020 showed normal LV function, mild diastolic dysfunction, mild left ventricular hypertrophy and negative saline microcavitation study.Admitted with CVA May 2020. 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 Aspirin added to apixaban by neurologyas CVA occurred on apixaban. Patient was seen in follow-up by Dr. Marin Olp and it was felt that polycythemia may be contributing to his recent CVA.  Since last seenthe patient has dyspnea with more extreme activities but not with routine activities. It is relieved with rest. It is not associated with chest pain. There is no orthopnea, PND or pedal edema. There is no syncope. There is no exertional chest pain. Occasional brief palpitations.   Current Outpatient Medications  Medication Sig Dispense Refill  . acetaminophen (TYLENOL) 500 MG tablet Take 1,000 mg by mouth every 6 (six) hours as needed for mild pain.     Marland Kitchen aspirin EC 81 MG EC tablet Take 1 tablet (81 mg total) by  mouth daily. (Patient taking differently: Take 81 mg by mouth daily. 1/2 tablet daily) 60 tablet 0  . buPROPion (WELLBUTRIN SR) 150 MG 12 hr tablet Take 150 mg by mouth every morning.  12  . ELIQUIS 5 MG TABS tablet TAKE 1 TABLET BY MOUTH TWICE A DAY 180 tablet 1  . fluticasone (FLONASE) 50 MCG/ACT nasal spray Place 1 spray into both nostrils daily as needed for allergies.     Marland Kitchen gabapentin (NEURONTIN) 300 MG capsule Take 1 capsule (300 mg total) by mouth at bedtime. 30 capsule 2  . Multiple Vitamins-Minerals (MENS MULTIVITAMIN) TABS Take 1 tablet by mouth daily.    . propranolol ER (INDERAL LA) 160 MG SR capsule Take 1 capsule (160 mg total) by mouth daily. 90 capsule 3  . ruxolitinib phosphate (JAKAFI) 10 MG tablet Take 1 tablet (10 mg total) by mouth 2 (two) times daily. 60 tablet 4  . zolpidem (AMBIEN CR) 12.5 MG CR tablet Take 6.25 mg by mouth at bedtime.     Marland Kitchen losartan (COZAAR) 50 MG tablet Take 1 tablet (50 mg total) by mouth daily. 90 tablet 3   No current facility-administered medications for this visit.      Past Medical History:  Diagnosis Date  . Diverticulosis of colon (without mention of hemorrhage)   . Hemorrhoids   . Hypertension   . IBS (irritable bowel syndrome)   . Iron deficiency anemia due to chronic blood loss 09/26/2017  . Irregular heartbeat   . Nephrolithiasis   . Other and unspecified hyperlipidemia   . Perirectal fistula   . Polycythemia, secondary   . Stroke ([place])   . Stye  blocker duct right eye lid    Past Surgical History:  Procedure Laterality Date  . RETINAL DETACHMENT SURGERY    . ROTATOR CUFF REPAIR     rt.  Marland Kitchen VASECTOMY      Social History   Socioeconomic History  . Marital status: Married    Spouse name: Not on file  . Number of children: 2  . Years of education: Not on file  . Highest education level: Not on file  Occupational History  . Occupation: Lobbyist: [employer]  Social Needs  . Financial resource  strain: Not on file  . Food insecurity    Worry: Not on file    Inability: Not on file  . Transportation needs    Medical: Not on file    Non-medical: Not on file  Tobacco Use  . Smoking status: Never Smoker  . Smokeless tobacco: Never Used  Substance and Sexual Activity  . Alcohol use: Yes    Alcohol/week: 0.0 standard drinks    Comment: Occasional  . Drug use: No  . Sexual activity: Not on file  Lifestyle  . Physical activity    Days per week: Not on file    Minutes per session: Not on file  . Stress: Not on file  Relationships  . Social Herbalist on phone: Not on file    Gets together: Not on file    Attends religious service: Not on file    Active member of club or organization: Not on file    Attends meetings of clubs or organizations: Not on file    Relationship status: Not on file  . Intimate partner violence    Fear of current or ex partner: Not on file    Emotionally abused: Not on file    Physically abused: Not on file    Forced sexual activity: Not on file  Other Topics Concern  . Not on file  Social History Narrative  . Not on file    Family History  Problem Relation Age of Onset  . Aortic aneurysm Father   . Heart attack Brother   . Colon cancer Neg Hx   . Esophageal cancer Neg Hx   . Pancreatic cancer Neg Hx   . Prostate cancer Neg Hx   . Rectal cancer Neg Hx   . Stomach cancer Neg Hx     ROS: no fevers or chills, productive cough, hemoptysis, dysphasia, odynophagia, melena, hematochezia, dysuria, hematuria, rash, seizure activity, orthopnea, PND, pedal edema, claudication. Remaining systems are negative.  Physical Exam: Answers questions appropriately Normal affect No acute distress Remainder physical examination not performed (coronavirus pandemic)  A/P  1 PAF-plan to continue Inderal for rate control if atrial fibrillation recurs.  Continue apixaban.  Note his prior CVA occurred on apixaban alone.  I have therefore continued  aspirin.  2 hypertension-patient's blood pressure is controlled.  We will continue with present medical regimen.  3 hyperlipidemia-patient had increased liver functions on Lipitor.  I will try Crestor 5 mg daily in light of cerebrovascular disease.  Check lipids and liver in 8 weeks.  4 polycythemia vera-managed by hematology.  5 prior CVA-managed by neurology.  18 min spent with pt and chart today  Kirk Ruths, MD

## 2019-02-16 ENCOUNTER — Inpatient Hospital Stay: Payer: Managed Care, Other (non HMO)

## 2019-02-16 ENCOUNTER — Encounter: Payer: Self-pay | Admitting: Hematology & Oncology

## 2019-02-16 ENCOUNTER — Ambulatory Visit (HOSPITAL_BASED_OUTPATIENT_CLINIC_OR_DEPARTMENT_OTHER)
Admission: RE | Admit: 2019-02-16 | Discharge: 2019-02-16 | Disposition: A | Discharge status: Home / Self Care | Payer: Managed Care, Other (non HMO) | Source: Ambulatory Visit | Attending: Hematology & Oncology | Admitting: Hematology & Oncology

## 2019-02-16 ENCOUNTER — Other Ambulatory Visit: Payer: Self-pay

## 2019-02-16 ENCOUNTER — Inpatient Hospital Stay: Payer: Managed Care, Other (non HMO) | Attending: Hematology & Oncology | Admitting: Hematology & Oncology

## 2019-02-16 VITALS — BP 130/78 | HR 60 | Temp 97.1°F | Resp 18 | Wt 191.0 lb

## 2019-02-16 DIAGNOSIS — D751 Secondary polycythemia: Secondary | ICD-10-CM

## 2019-02-16 DIAGNOSIS — I480 Paroxysmal atrial fibrillation: Secondary | ICD-10-CM

## 2019-02-16 DIAGNOSIS — D500 Iron deficiency anemia secondary to blood loss (chronic): Secondary | ICD-10-CM

## 2019-02-16 LAB — CBC WITH DIFFERENTIAL (CANCER CENTER ONLY)
Abs Immature Granulocytes: 0.54 10*3/uL — ABNORMAL HIGH (ref 0.00–0.07)
Basophils Absolute: 0.2 10*3/uL — ABNORMAL HIGH (ref 0.0–0.1)
Basophils Relative: 2 %
Eosinophils Absolute: 0.5 10*3/uL (ref 0.0–0.5)
Eosinophils Relative: 5 %
HCT: 32.9 % — ABNORMAL LOW (ref 39.0–52.0)
Hemoglobin: 10.8 g/dL — ABNORMAL LOW (ref 13.0–17.0)
Immature Granulocytes: 6 %
Lymphocytes Relative: 13 %
Lymphs Abs: 1.3 10*3/uL (ref 0.7–4.0)
MCH: 29 pg (ref 26.0–34.0)
MCHC: 32.8 g/dL (ref 30.0–36.0)
MCV: 88.2 fL (ref 80.0–100.0)
Monocytes Absolute: 0.9 10*3/uL (ref 0.1–1.0)
Monocytes Relative: 9 %
Neutro Abs: 6.6 10*3/uL (ref 1.7–7.7)
Neutrophils Relative %: 65 %
Platelet Count: 297 10*3/uL (ref 150–400)
RBC: 3.73 MIL/uL — ABNORMAL LOW (ref 4.22–5.81)
RDW: 18.2 % — ABNORMAL HIGH (ref 11.5–15.5)
WBC Count: 9.8 10*3/uL (ref 4.0–10.5)
nRBC: 1.3 % — ABNORMAL HIGH (ref 0.0–0.2)

## 2019-02-16 LAB — CMP (CANCER CENTER ONLY)
ALT: 62 U/L — ABNORMAL HIGH (ref 0–44)
AST: 37 U/L (ref 15–41)
Albumin: 4.6 g/dL (ref 3.5–5.0)
Alkaline Phosphatase: 54 U/L (ref 38–126)
Anion gap: 7 (ref 5–15)
BUN: 24 mg/dL — ABNORMAL HIGH (ref 8–23)
CO2: 27 mmol/L (ref 22–32)
Calcium: 9.4 mg/dL (ref 8.9–10.3)
Chloride: 106 mmol/L (ref 98–111)
Creatinine: 1.26 mg/dL — ABNORMAL HIGH (ref 0.61–1.24)
GFR, Est AFR Am: >60 mL/min (ref >60)
GFR, Estimated: >60 mL/min (ref >60)
Glucose, Bld: 112 mg/dL — ABNORMAL HIGH (ref 70–99)
Potassium: 4.3 mmol/L (ref 3.5–5.1)
Sodium: 140 mmol/L (ref 135–145)
Total Bilirubin: 1 mg/dL (ref 0.3–1.2)
Total Protein: 6.3 g/dL — ABNORMAL LOW (ref 6.5–8.1)

## 2019-02-16 LAB — LACTATE DEHYDROGENASE: LDH: 655 U/L — ABNORMAL HIGH (ref 98–192)

## 2019-02-16 NOTE — Progress Notes (Signed)
Hematology and Oncology Follow Up Visit  [name] [name] [id number] Sep 10, 1955 63 y.o. 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     Interim History:  [name] [name] is here today for follow-up.  He is doing quite well.  He and his brother got back from the beach.  They are there for a week.  They really enjoy themselves.  He had an ultrasound of his abdomen today.  It still shows splenomegaly.  However, his spleen has not increased in size.  The volume was 1386 cm.  Back in July, the volume was 1400 cm.  He has had no problems with the [facility].  He has had no issues with diarrhea.  There is no cough.  He has had no nausea or vomiting.  He has had no headache.  He is not noted any problems with leg swelling.  His liver function studies do appear to be a little better now.  We took him off his statin drug.  I think his family doctor will have to try to see what they can do about get him back on something for cholesterol given that he had the TIA.  Currently, his performance status is ECOG 1.    Medications:  Allergies as of 02/16/2019   No Known Allergies     Medication List       Accurate as of February 16, 2019 12:42 PM. If you have any questions, ask your nurse or doctor.        STOP taking these medications   anagrelide 1 MG capsule Commonly known as: AGRYLIN Stopped by: [name] Napoleon, MD   atorvastatin 40 MG tablet Commonly known as: LIPITOR Stopped by: [name] Napoleon, MD     TAKE these medications   acetaminophen 500 MG tablet Commonly known as: TYLENOL  Take 1,000 mg by mouth every 6 (six) hours as needed for mild pain.   aspirin 81 MG EC tablet Take 1 tablet (81 mg total) by mouth daily.   buPROPion 150 MG 12 hr tablet Commonly known as: WELLBUTRIN SR Take 150 mg by mouth every morning.   Eliquis 5 MG Tabs tablet Generic drug: apixaban TAKE 1 TABLET BY MOUTH TWICE A DAY   fluticasone 50 MCG/ACT nasal spray Commonly known as: FLONASE Place 1 spray into both nostrils daily as needed for allergies.   gabapentin 300 MG capsule Commonly known as: NEURONTIN Take 1 capsule (300 mg total) by mouth at bedtime.   losartan 25 MG tablet Commonly known as: COZAAR Take 25 mg by mouth 2 (two) times daily. Take two tablets by mouth , total of 50 mg daily.   Mens Multivitamin Tabs Take 1 tablet by mouth daily.   propranolol ER 160 MG SR capsule Commonly known as: INDERAL LA Take 1 capsule (160 mg total) by mouth daily.   ruxolitinib phosphate 10 MG tablet Commonly known as: JAKAFI Take 1 tablet (10 mg total) by mouth 2 (two) times daily.   zolpidem 12.5 MG CR tablet Commonly known as: AMBIEN CR Take 6.25 mg by mouth at bedtime.  Allergies: No Known Allergies  Past Medical History, Surgical history, Social history, and Family History were reviewed and updated.  Review of Systems: Review of Systems  Constitutional: Negative.   HENT: Negative.   Eyes: Negative.   Respiratory: Negative.   Cardiovascular: Positive for palpitations.  Gastrointestinal: Positive for abdominal pain.  Genitourinary: Negative.   Musculoskeletal: Negative.   Skin: Negative.   Neurological: Negative.   Endo/Heme/Allergies: Negative.   Psychiatric/Behavioral: Negative.      Physical Exam:  weight is 191 lb (86.6 kg). His temporal temperature is 97.1 F (36.2 C) (abnormal). His blood pressure is 130/78 and his pulse is 60. His respiration is 18 and oxygen saturation is 98%.   Wt Readings from Last 3 Encounters:  02/16/19 191 lb (86.6 kg)   12/29/18 197 lb (89.4 kg)  12/04/18 195 lb (88.5 kg)    Physical Exam Vitals signs reviewed.  HENT:     Head: Normocephalic and atraumatic.  Eyes:     Pupils: Pupils are equal, round, and reactive to light.  Neck:     Musculoskeletal: Normal range of motion.  Cardiovascular:     Rate and Rhythm: Normal rate and regular rhythm.     Heart sounds: Normal heart sounds.  Pulmonary:     Effort: Pulmonary effort is normal.     Breath sounds: Normal breath sounds.  Abdominal:     General: Bowel sounds are normal.     Palpations: Abdomen is soft.     Comments: Abdominal exam shows a soft abdomen with good bowel sounds.  He has no fluid wave.  There is no guarding or rebound tenderness.  There is no obvious hepatomegaly.  His spleen tip does feel about 2cm below the left costal margin.  Musculoskeletal: Normal range of motion.        General: No tenderness or deformity.  Lymphadenopathy:     Cervical: No cervical adenopathy.  Skin:    General: Skin is warm and dry.     Findings: No erythema or rash.  Neurological:     Mental Status: He is alert and oriented to person, place, and time.  Psychiatric:        Behavior: Behavior normal.        Thought Content: Thought content normal.        Judgment: Judgment normal.      Lab Results  Component Value Date   WBC 9.8 02/16/2019   HGB 10.8 (L) 02/16/2019   HCT 32.9 (L) 02/16/2019   MCV 88.2 02/16/2019   PLT 297 02/16/2019   Lab Results  Component Value Date   FERRITIN 122 12/04/2018   IRON 127 12/04/2018   TIBC 346 12/04/2018   UIBC 219 12/04/2018   IRONPCTSAT 37 12/04/2018   Lab Results  Component Value Date   RETICCTPCT 3.2 (H) 12/04/2018   RBC 3.73 (L) 02/16/2019   RETICCTABS 87.6 02/21/2011   No results found for: KPAFRELGTCHN, LAMBDASER, KAPLAMBRATIO No results found for: IGGSERUM, IGA, IGMSERUM No results found for: Odetta Pink, SPEI   Chemistry       Component Value Date/Time   NA 140 02/16/2019 1047   NA 144 04/25/2017 1506   NA 139 04/12/2016 1113   K 4.3 02/16/2019 1047   K 4.2 04/25/2017 1506   K 4.1 04/12/2016 1113   CL 106 02/16/2019 1047   CL 105 04/25/2017 1506   CO2 27 02/16/2019 1047   CO2 29 04/25/2017 1506   CO2 22  04/12/2016 1113   BUN 24 (H) 02/16/2019 1047   BUN 17 04/25/2017 1506   BUN 16.3 04/12/2016 1113   CREATININE 1.26 (H) 02/16/2019 1047   CREATININE 1.2 04/25/2017 1506   CREATININE 0.9 04/12/2016 1113      Component Value Date/Time   CALCIUM 9.4 02/16/2019 1047   CALCIUM 9.5 04/25/2017 1506   CALCIUM 9.1 04/12/2016 1113   ALKPHOS 54 02/16/2019 1047   ALKPHOS 73 04/25/2017 1506   ALKPHOS 66 04/12/2016 1113   AST 37 02/16/2019 1047   AST 31 04/12/2016 1113   ALT 62 (H) 02/16/2019 1047   ALT 58 (H) 04/25/2017 1506   ALT 36 04/12/2016 1113   BILITOT 1.0 02/16/2019 1047   BILITOT 0.73 04/12/2016 1113       Impression and Plan: Mr. Lingg is a very pleasant 63 yo caucasian gentleman with polycythemia vera, JAK2 positive.   I feel confident that the Shanon Brow is working.  His spleen still does not seem to be enlarging.  I am glad that his liver function studies are better.  However, he probably needs to be on a statin drug or some agent for his cholesterol.  We will have to let his family doctor have to manage this.  He sees his cardiologist I think in a week or so.  We will try to get him back to see [place] in another 6 or 7 weeks.  I want to try to get him back right after Thanksgiving.      [name] Napoleon, MD 10/12/202012:42 PM

## 2019-02-17 LAB — TSH: TSH: 1.718 u[IU]/mL (ref 0.320–4.118)

## 2019-02-17 LAB — IRON AND TIBC
Iron: 159 ug/dL (ref 42–163)
Saturation Ratios: 45 % (ref 20–55)
TIBC: 351 ug/dL (ref 202–409)
UIBC: 193 ug/dL (ref 117–376)

## 2019-02-17 LAB — FERRITIN: Ferritin: 109 ng/mL (ref 24–336)

## 2019-02-18 ENCOUNTER — Telehealth (INDEPENDENT_AMBULATORY_CARE_PROVIDER_SITE_OTHER): Payer: Managed Care, Other (non HMO) | Admitting: Cardiology

## 2019-02-18 ENCOUNTER — Other Ambulatory Visit: Payer: Self-pay

## 2019-02-18 ENCOUNTER — Encounter: Payer: Self-pay | Admitting: Cardiology

## 2019-02-18 VITALS — BP 118/69 | HR 65 | Ht 71.5 in | Wt 191.0 lb

## 2019-02-18 DIAGNOSIS — I10 Essential (primary) hypertension: Secondary | ICD-10-CM

## 2019-02-18 DIAGNOSIS — I480 Paroxysmal atrial fibrillation: Secondary | ICD-10-CM | POA: Diagnosis not present

## 2019-02-18 DIAGNOSIS — E7800 Pure hypercholesterolemia, unspecified: Secondary | ICD-10-CM | POA: Diagnosis not present

## 2019-02-18 MED ORDER — ROSUVASTATIN CALCIUM 5 MG PO TABS: 5.0000 mg | ORAL_TABLET | Freq: Every day | ORAL | 3 refills | Status: DC

## 2019-02-18 MED ORDER — LOSARTAN POTASSIUM 50 MG PO TABS: 50.0000 mg | ORAL_TABLET | Freq: Every day | ORAL | 3 refills | Status: DC

## 2019-02-18 NOTE — Patient Instructions (Addendum)
Medication Instructions:  START ROSUVASTATIN 5 MG ONCE DAILY  If you need a refill on your cardiac medications before your next appointment, please call your pharmacy.   Lab work: Your physician recommends that you return for lab work in: [place]  If you have labs (blood work) drawn today and your tests are completely normal, you will receive your results only by: Marland Kitchen MyChart Message (if you have MyChart) OR . A paper copy in the mail If you have any lab test that is abnormal or we need to change your treatment, we will call you to review the results.  Follow-Up: At [facility], you and your health needs are our priority.  As part of our continuing mission to provide you with exceptional heart care, we have created designated Provider Care Teams.  These Care Teams include your primary Cardiologist (physician) and Advanced Practice Providers (APPs -  Physician Assistants and Nurse Practitioners) who all work together to provide you with the care you need, when you need it. Your physician wants you to follow-up in: [place] will receive a reminder letter in the mail two months in advance. If you don't receive a letter, please call our office to schedule the follow-up appointment.

## 2019-03-03 ENCOUNTER — Other Ambulatory Visit: Payer: Self-pay | Admitting: Hematology & Oncology

## 2019-03-03 DIAGNOSIS — D751 Secondary polycythemia: Secondary | ICD-10-CM

## 2019-03-05 MED FILL — JAKAFI 10 MG TABLET: 10 | 30 days supply | Qty: 60 | Fill #0

## 2019-03-20 ENCOUNTER — Other Ambulatory Visit: Payer: Self-pay | Admitting: Adult Health

## 2019-03-23 NOTE — Telephone Encounter (Signed)
Gabapentin refilled x 1 month. Patient has FU next week.

## 2019-04-01 ENCOUNTER — Encounter: Payer: Self-pay | Admitting: Adult Health

## 2019-04-01 ENCOUNTER — Other Ambulatory Visit: Payer: Self-pay

## 2019-04-01 ENCOUNTER — Ambulatory Visit: Payer: Managed Care, Other (non HMO) | Admitting: Adult Health

## 2019-04-01 VITALS — BP 139/86 | HR 56 | Temp 97.4°F | Ht 71.5 in | Wt 198.6 lb

## 2019-04-01 DIAGNOSIS — I480 Paroxysmal atrial fibrillation: Secondary | ICD-10-CM

## 2019-04-01 DIAGNOSIS — I639 Cerebral infarction, unspecified: Secondary | ICD-10-CM

## 2019-04-01 DIAGNOSIS — I10 Essential (primary) hypertension: Secondary | ICD-10-CM

## 2019-04-01 DIAGNOSIS — E782 Mixed hyperlipidemia: Secondary | ICD-10-CM | POA: Diagnosis not present

## 2019-04-01 DIAGNOSIS — R29818 Other symptoms and signs involving the nervous system: Secondary | ICD-10-CM

## 2019-04-01 DIAGNOSIS — M792 Neuralgia and neuritis, unspecified: Secondary | ICD-10-CM | POA: Diagnosis not present

## 2019-04-01 NOTE — Patient Instructions (Signed)
Recommend scheduling evaluation with one of our sleep specialist to assess for potential sleep apnea as untreated sleep apnea can cause complications with your atrial fibrillation, increase risk for worsening cardiovascular disease and increased risk for additional strokes  Continue aspirin 81 mg daily and Eliquis (apixaban) daily  and Crestor for secondary stroke prevention  Continue to follow with PCP/cardiology regarding blood pressure and cholesterol management  Continue to follow with oncology for polycythemia  Continue to follow with ophthalmology as scheduled  Continue to monitor blood pressure at home  Maintain strict control of hypertension with blood pressure goal below 130/90, diabetes with hemoglobin A1c goal below 6.5% and cholesterol with LDL cholesterol (bad cholesterol) goal below 70 mg/dL. I also advised the patient to eat a healthy diet with plenty of whole grains, cereals, fruits and vegetables, exercise regularly and maintain ideal body weight.         Thank you for coming to see [place] at [facility]. I hope we have been able to provide you high quality care today.  You may receive a patient satisfaction survey over the next few weeks. We would appreciate your feedback and comments so that we may continue to improve ourselves and the health of our patients.

## 2019-04-01 NOTE — Progress Notes (Signed)
[facility] [address] [place]. [place] [address] [phone]       OFFICE FOLLOW UP NOTE  [name] Date of Birth:  08/15/1955 Medical Record Number:  [id number]   Reason for Referral:  hospital stroke follow up   CHIEF COMPLAINT:  Chief Complaint  Patient presents with   Follow-up    Tx room, alone. States no changes. Tingling in hands and feet. Mostly in feet.     HPI: Stroke admission 09/14/2018: [name] with history of polycythemia, hypertension, PVCs, brief PAT and paroxysmal atrial fibrillation seen on monitor in February 2020-on Eliquis,   who presented with right upper and lower extremity paresthesias.  He did not receive IV t-PA due to anticoagulation and late presentation (>4.5 hours from time of onset).  Stroke work-up revealed subcentimeter acute/late subacute infarction within the cortex of the left superior precentral gyrus likely cardioembolic.  Recommended continuation of Eliquis and potentially adding aspirin 81 mg which was recommended to be decided by cardiology.  HTN stable.  LDL 76 and recommended atorvastatin 40 mg daily.  Other stroke risk factors include advanced age, EtOH use, history of stroke and atrial fibrillation.  He was discharged home in stable condition without therapy needs.  10/28/2018 virtual visit: he has been stable from a stroke standpoint without residual deficits  He has had 2 additional episodes of right hand and foot cold sensation which lasted approximately 1 hour -denies weakness, numbness or tingling Decreased energy level which is slowly improving ?? 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 who is being seen today for 26-month follow-up visit.  He has been doing well from a stroke standpoint without residual deficits.  He does come in today with a long list of chronic complaints including tingling/burning sensation in feet bilaterally with tight/cramping sensation.  Tingling/numbness right hand, puffiness under left eye, odd feelings or sensations with at times lightheaded lasting for less than 1 minute and then resolve, occasional balance difficulties, occasional headaches, and decreased concentration/focus towards end of the day.  All symptoms have been present over the past several years and denies worsening with recent stroke.  He has not had prior work-up regarding numbness/tingling or nerve pain complaints.  He has not previously been on medication therapy for nerve pain.  Pain is typically worsened at night while he is sitting and relaxing.  He also endorses occasional cold sensation of lower extremities and right hand.  He does take Ambien 6.25 mg and melatonin 5 mg nightly due to insomnia.  He will occasionally wake up throughout the night with at times difficulty returning to sleep.  He does endorse daytime fatigue but does not typically stop to take a nap.  He has not previously underwent sleep study.  He continues on Eliquis 5 mg twice daily and aspirin half tab daily due to experiencing bright red blood with  bowel movements and lasted for approximately 2 to 3 days approximately 3 weeks ago.  He stopped use of aspirin initially with resolution of symptoms and restarted half tab without recurrent symptoms.  Continues on aspirin from cardiac standpoint.  Continues on  atorvastatin 40 mg daily.  Blood pressure today 120/69.  No further concerns at this time.  Update 04/01/2019: [name] who is being seen today for stroke follow-up.  He has been doing well from a stroke standpoint without residual deficits or reoccurring of symptoms.  He has continued on Eliquis 5 mg twice daily as well as aspirin 81 mg daily without bleeding or bruising.  Continues to follow with cardiology regularly.  Previously on atorvastatin but discontinued due to elevated liver function tests.  Liver function test improved therefore started Crestor with recent LFTs stable.  He continues to follow with oncology regularly for history of polycythemia.  Blood pressure today 139/86.  At prior visit, multiple concerns and recommended EMG/NCV due to right hand paresthesias as well as sleep evaluation for complaints of insomnia and daytime fatigue along with recent stroke and cardiac history.  He states right hand paresthesias have improved with only occasional symptoms and questions need of further evaluation at this time.  He also endorses improvement of insomnia but continues to have daytime fatigue.  He was unaware of increased risk with untreated sleep apnea in regards to atrial fibrillation and recent stroke.  No further concerns at this time.      ROS:   14 system review of systems performed and negative with exception of see HPI  PMH:  Past Medical History:  Diagnosis Date   Diverticulosis of colon (without mention of hemorrhage)    Hemorrhoids    Hypertension    IBS (irritable bowel syndrome)    Iron deficiency anemia due to chronic blood loss 09/26/2017   Irregular heartbeat    Nephrolithiasis    Other and unspecified hyperlipidemia    Perirectal fistula    Polycythemia, secondary    Stroke (HCC)    Stye    blocker duct right eye lid    PSH:  Past Surgical History:  Procedure Laterality Date   RETINAL DETACHMENT SURGERY     ROTATOR  CUFF REPAIR     rt.   VASECTOMY      Social History:  Social History   Socioeconomic History   Marital status: Married    Spouse name: Not on file   Number of children: 2   Years of education: Not on file   Highest education level: Not on file  Occupational History   Occupation: Lobbyist: [employer] resource strain: Not on file   Food insecurity    Worry: Not on file    Inability: Not on file   Transportation needs    Medical: Not on file    Non-medical: Not on file  Tobacco Use   Smoking status: Never Smoker   Smokeless tobacco: Never Used  Substance and Sexual Activity   Alcohol use: Yes    Alcohol/week: 0.0 standard drinks    Comment: Occasional   Drug use: No   Sexual activity: Not on file  Lifestyle   Physical activity    Days per week: Not on file    Minutes per session: Not on file   Stress: Not on file  Relationships   Social connections    Talks on phone: Not on file  Gets together: Not on file    Attends religious service: Not on file    Active member of club or organization: Not on file    Attends meetings of clubs or organizations: Not on file    Relationship status: Not on file   Intimate partner violence    Fear of current or ex partner: Not on file    Emotionally abused: Not on file    Physically abused: Not on file    Forced sexual activity: Not on file  Other Topics Concern   Not on file  Social History Narrative   Not on file    Family History:  Family History  Problem Relation Age of Onset   Aortic aneurysm Father    Heart attack Brother    Colon cancer Neg Hx    Esophageal cancer Neg Hx    Pancreatic cancer Neg Hx    Prostate cancer Neg Hx    Rectal cancer Neg Hx    Stomach cancer Neg Hx     Medications:   Current Outpatient Medications on File Prior to Visit  Medication Sig Dispense Refill   acetaminophen (TYLENOL) 500 MG tablet Take 1,000 mg by  mouth every 6 (six) hours as needed for mild pain.      aspirin EC 81 MG EC tablet Take 1 tablet (81 mg total) by mouth daily. (Patient taking differently: Take 81 mg by mouth daily. 1/2 tablet daily) 60 tablet 0   buPROPion (WELLBUTRIN SR) 150 MG 12 hr tablet Take 150 mg by mouth every morning.  12   ELIQUIS 5 MG TABS tablet TAKE 1 TABLET BY MOUTH TWICE A DAY 180 tablet 1   fluticasone (FLONASE) 50 MCG/ACT nasal spray Place 1 spray into both nostrils daily as needed for allergies.      gabapentin (NEURONTIN) 300 MG capsule TAKE 1 CAPSULE BY MOUTH EVERYDAY AT BEDTIME 30 capsule 0   JAKAFI 10 MG tablet TAKE 1 TABLET (10 MG TOTAL) BY MOUTH 2 (TWO) TIMES DAILY. 60 tablet 4   losartan (COZAAR) 25 MG tablet Take 25 mg by mouth 2 (two) times daily.     Multiple Vitamins-Minerals (MENS MULTIVITAMIN) TABS Take 1 tablet by mouth daily.     propranolol ER (INDERAL LA) 160 MG SR capsule Take 1 capsule (160 mg total) by mouth daily. 90 capsule 3   rosuvastatin (CRESTOR) 5 MG tablet Take 1 tablet (5 mg total) by mouth daily. 90 tablet 3   zolpidem (AMBIEN CR) 12.5 MG CR tablet Take 6.25 mg by mouth at bedtime.      No current facility-administered medications on file prior to visit.     Allergies:   Allergies  Allergen Reactions   Zoster Vaccine Recombinant, Adjuvanted     Fever.     Physical Exam  Today's Vitals   04/01/19 1517  BP: 139/86  Pulse: (!) 56  Temp: (!) 97.4 F (36.3 C)  Weight: 198 lb 9.6 oz (90.1 kg)  Height: 5' 11.5" (1.816 m)   Body mass index is 27.31 kg/m.  General: well developed, well nourished,  pleasant middle-age Caucasian male, seated, in no evident distress Head: head normocephalic and atraumatic.   Neck: supple with no carotid or supraclavicular bruits Cardiovascular: regular rate and rhythm, no murmurs Musculoskeletal: no deformity Skin:  no rash/petichiae Vascular:  Normal pulses all extremities   Neurologic Exam Mental Status: Awake and  fully alert. Oriented to place and time. Recent and remote memory intact. Attention span, concentration and fund  of knowledge appropriate. Mood and affect appropriate.  Cranial Nerves: Fundoscopic exam reveals sharp disc margins. Pupils equal, briskly reactive to light. Extraocular movements full without nystagmus. Visual fields full to confrontation. Hearing intact. Facial sensation intact. Face, tongue, palate moves normally and symmetrically.  Motor: Normal bulk and tone. Normal strength in all tested extremity muscles. Sensory.: intact to touch , pinprick , position and vibratory sensation.  Coordination: Rapid alternating movements normal in all extremities. Finger-to-nose and heel-to-shin performed accurately bilaterally. Gait and Station: Arises from chair without difficulty. Stance is normal. Gait demonstrates normal stride length and balance Reflexes: 1+ and symmetric. Toes downgoing.      Diagnostic Data (Labs, Imaging, Testing)  [name] Contrast 09/13/2018 IMPRESSION:  1. Subcentimeter acute/early subacute infarction within the cortex of left superior precentral gyrus. No associated hemorrhage or mass effect.  2. Increased susceptibility signal appears associated with blood vessel in the left posterior sylvian fissure of uncertain significance, possibly hyperemia associated with recent ischemia given adjacent infarction. No vascular malformation is present this location prior MRA of the head. Multiple thromboemboli or subarachnoid hemorrhage is considered unlikely in the absence of secondary findings.  3. Mild chronic microvascular ischemic changes of the brain. Very small chronic infarction in the right cerebellar hemisphere.    Ct Angio Head W Or Wo Contrast Ct Angio Neck W Or Wo Contrast  09/13/2018 IMPRESSION:  1. Left-greater-than-right cervical ICA ectasia measuring up to 9 mm diameter on the left. Left upper ICA small posterior directed sacculation/penetrating ulcer at C2  level. Multiple segments of stenosis in the intracranial circulation including a beaded appearance in the small vessels of the anterior circulation. Relatively mild atherosclerotic disease. 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  2. No dissection, large vessel occlusion, aneurysm, or vascular malformation identified. No high-grade stenosis in the neck.    Ct Head Wo Contrast 09/12/2018 IMPRESSION:  Mild atrophic changes without acute abnormality.    Dg Chest 2 View 09/12/2018 IMPRESSION:  No active cardiopulmonary disease.    Transthoracic Echocardiogram  Normal ejection fraction of 55 to 60%.  No cardiac source of embolism.   EKG - SB rate 58 BPM. (See cardiology reading for complete details)     ASSESSMENT: Calyb Simonson is a 63 y.o. 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 Vascular risk factors include polycythemia, HTN, HLD, atrial fibrillation and prior stroke.  Recovered well from a stroke standpoint without residual deficits.     PLAN:  1. Cardioembolic stroke : Continue aspirin 81 mg daily and Eliquis (apixaban) daily  and Crestor for secondary stroke prevention. Maintain strict control of hypertension with blood pressure goal below 130/90, diabetes with hemoglobin A1c goal below 6.5% and cholesterol with LDL cholesterol (bad cholesterol) goal below 70 mg/dL.  I also advised the patient to eat a healthy diet with plenty of whole grains, cereals, fruits and vegetables, exercise regularly with at least 30 minutes of continuous activity daily and maintain ideal body weight. 2. HTN: Advised to continue current treatment regimen.  Advised to continue to monitor at home along with continued follow-up with PCP for management 3. HLD: Advised to continue current treatment regimen along with continued  follow-up with PCP for future prescribing and monitoring of lipid panel 4. Atrial fibrillation: Ongoing follow-up with cardiology for Eliquis and atrial fibrillation management 5. Polycythemia: Ongoing follow-up with oncology 6. Suspect sleep apnea: Discussion  regarding increase prevalence of sleep apnea with atrial fibrillation and recent stroke and increased risk of potential untreated sleep apnea despite mild subjective symptoms.  He verbalized understanding and plans on scheduling initial evaluation. 7. Neuropathic pains: Improved since prior visit.  Advised him that EMG/NCV can be placed on hold at this time as results would likely not change further management.  Advised him if symptoms worsen or would just like to have a diagnosis such as potential mild carpal tunnel or neuropathy, he can proceed with EMG/NCV   Overall stable from stroke standpoint recommend follow-up as needed   Greater than 50% of time during this 25 minute visit was spent on counseling, along with discussion of multiple symptoms, reviewing risk factor management of HTN, HLD and atrial fibrillation, planning of further management along with potential future management, and discussion with patient answering all questions to satisfaction    [name], AGNP-BC  [facility] [address] [place], [place] [address]  Phone [phone] Fax [phone] Note: This document was prepared with digital dictation and possible smart phrase technology. Any transcriptional errors that result from this process are unintentional.

## 2019-04-06 ENCOUNTER — Other Ambulatory Visit: Payer: Managed Care, Other (non HMO)

## 2019-04-06 NOTE — Progress Notes (Signed)
I reached out to the pt. No answer, I left him a voicemail to return my call to schedule sleep consult. Thank you.

## 2019-04-09 ENCOUNTER — Other Ambulatory Visit: Payer: Self-pay | Admitting: Adult Health

## 2019-04-09 MED FILL — JAKAFI 10 MG TABLET: 10 | 30 days supply | Qty: 60 | Fill #1

## 2019-04-14 ENCOUNTER — Inpatient Hospital Stay: Payer: Managed Care, Other (non HMO) | Attending: Hematology & Oncology

## 2019-04-14 ENCOUNTER — Inpatient Hospital Stay: Payer: Managed Care, Other (non HMO)

## 2019-04-14 ENCOUNTER — Other Ambulatory Visit: Payer: Self-pay

## 2019-04-14 ENCOUNTER — Inpatient Hospital Stay: Payer: Managed Care, Other (non HMO) | Admitting: Hematology & Oncology

## 2019-04-14 ENCOUNTER — Encounter: Payer: Self-pay | Admitting: Hematology & Oncology

## 2019-04-14 VITALS — BP 127/85 | HR 58 | Temp 98.0°F | Resp 16 | Wt 197.0 lb

## 2019-04-14 DIAGNOSIS — Z7901 Long term (current) use of anticoagulants: Secondary | ICD-10-CM | POA: Insufficient documentation

## 2019-04-14 DIAGNOSIS — D500 Iron deficiency anemia secondary to blood loss (chronic): Secondary | ICD-10-CM | POA: Insufficient documentation

## 2019-04-14 DIAGNOSIS — R109 Unspecified abdominal pain: Secondary | ICD-10-CM | POA: Diagnosis not present

## 2019-04-14 DIAGNOSIS — Z79899 Other long term (current) drug therapy: Secondary | ICD-10-CM | POA: Insufficient documentation

## 2019-04-14 DIAGNOSIS — D751 Secondary polycythemia: Secondary | ICD-10-CM

## 2019-04-14 DIAGNOSIS — Z7982 Long term (current) use of aspirin: Secondary | ICD-10-CM | POA: Diagnosis not present

## 2019-04-14 DIAGNOSIS — Z8673 Personal history of transient ischemic attack (TIA), and cerebral infarction without residual deficits: Secondary | ICD-10-CM | POA: Insufficient documentation

## 2019-04-14 DIAGNOSIS — D45 Polycythemia vera: Secondary | ICD-10-CM | POA: Diagnosis present

## 2019-04-14 DIAGNOSIS — R002 Palpitations: Secondary | ICD-10-CM | POA: Insufficient documentation

## 2019-04-14 LAB — CBC WITH DIFFERENTIAL (CANCER CENTER ONLY)
Abs Immature Granulocytes: 0.45 10*3/uL — ABNORMAL HIGH (ref 0.00–0.07)
Basophils Absolute: 0.2 10*3/uL — ABNORMAL HIGH (ref 0.0–0.1)
Basophils Relative: 2 %
Eosinophils Absolute: 0.3 10*3/uL (ref 0.0–0.5)
Eosinophils Relative: 4 %
HCT: 34.5 % — ABNORMAL LOW (ref 39.0–52.0)
Hemoglobin: 11.3 g/dL — ABNORMAL LOW (ref 13.0–17.0)
Immature Granulocytes: 5 %
Lymphocytes Relative: 15 %
Lymphs Abs: 1.4 10*3/uL (ref 0.7–4.0)
MCH: 28.3 pg (ref 26.0–34.0)
MCHC: 32.8 g/dL (ref 30.0–36.0)
MCV: 86.5 fL (ref 80.0–100.0)
Monocytes Absolute: 0.9 10*3/uL (ref 0.1–1.0)
Monocytes Relative: 9 %
Neutro Abs: 6.3 10*3/uL (ref 1.7–7.7)
Neutrophils Relative %: 65 %
Platelet Count: 272 10*3/uL (ref 150–400)
RBC: 3.99 MIL/uL — ABNORMAL LOW (ref 4.22–5.81)
RDW: 18.2 % — ABNORMAL HIGH (ref 11.5–15.5)
WBC Count: 9.6 10*3/uL (ref 4.0–10.5)
nRBC: 1.7 % — ABNORMAL HIGH (ref 0.0–0.2)

## 2019-04-14 LAB — CMP (CANCER CENTER ONLY)
ALT: 34 U/L (ref 0–44)
AST: 34 U/L (ref 15–41)
Albumin: 5.2 g/dL — ABNORMAL HIGH (ref 3.5–5.0)
Alkaline Phosphatase: 49 U/L (ref 38–126)
Anion gap: 7 (ref 5–15)
BUN: 18 mg/dL (ref 8–23)
CO2: 30 mmol/L (ref 22–32)
Calcium: 9.4 mg/dL (ref 8.9–10.3)
Chloride: 103 mmol/L (ref 98–111)
Creatinine: 0.97 mg/dL (ref 0.61–1.24)
GFR, Est AFR Am: >60 mL/min (ref >60)
GFR, Estimated: >60 mL/min (ref >60)
Glucose, Bld: 91 mg/dL (ref 70–99)
Potassium: 4 mmol/L (ref 3.5–5.1)
Sodium: 140 mmol/L (ref 135–145)
Total Bilirubin: 1.2 mg/dL (ref 0.3–1.2)
Total Protein: 6.9 g/dL (ref 6.5–8.1)

## 2019-04-14 LAB — LACTATE DEHYDROGENASE: LDH: 631 U/L — ABNORMAL HIGH (ref 98–192)

## 2019-04-14 LAB — SAVE SMEAR(SSMR), FOR PROVIDER SLIDE REVIEW

## 2019-04-14 NOTE — Progress Notes (Signed)
Hematology and Oncology Follow Up Visit  [name] [name] [id number] [birth year] 63 y.o. 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     Interim History:  [name] [name] is here today for follow-up.  He is feeling well.  He had a nice Thanksgiving.  It was somewhat quiet since his 2 daughters do not come in.  Again, the coronavirus has kept a lot of people away from there Thanksgiving celebrations.  He has had no problems with the [facility].  He has had no abdominal pain.  There is no diarrhea.  He has had no cough or shortness of breath.  He is seen a neurologist.  He had a TIA earlier in the year.  He is going to be evaluated for this a little bit further.  He is now on Crestor.  This seems to be helping him.  His liver function studies are normal.  He has not had any problems with bleeding.  He did have an episode of some bright red blood per rectum.  He thought this was probably from hemorrhoids.  There is been no problems with fever.  He has had no rashes.  He has had no leg swelling.  He is not felt any palpitations.       Overall, his performance status is ECOG 1. Currently, his performance status is ECOG 1.    Medications:  Allergies as of 04/14/2019      Reactions   Zoster Vaccine Recombinant, Adjuvanted    Fever.      Medication List       Accurate as of April 14, 2019 11:52 AM. If you have any questions, ask your nurse or doctor.        acetaminophen 500 MG tablet Commonly known as: TYLENOL Take 1,000 mg by mouth every 6 (six) hours as needed for mild pain.    aspirin 81 MG EC tablet Take 1 tablet (81 mg total) by mouth daily. What changed: additional instructions   buPROPion 150 MG 12 hr tablet Commonly known as: WELLBUTRIN SR Take 150 mg by mouth every morning.   Eliquis 5 MG Tabs tablet Generic drug: apixaban TAKE 1 TABLET BY MOUTH TWICE A DAY   fluticasone 50 MCG/ACT nasal spray Commonly known as: FLONASE Place 1 spray into both nostrils daily as needed for allergies.   gabapentin 300 MG capsule Commonly known as: NEURONTIN TAKE 1 CAPSULE BY MOUTH EVERYDAY AT BEDTIME   Jakafi 10 MG tablet Generic drug: ruxolitinib phosphate TAKE 1 TABLET (10 MG TOTAL) BY MOUTH 2 (TWO) TIMES DAILY.   losartan 50 MG tablet Commonly known as: COZAAR Take 50 mg by mouth daily. What changed: Another medication with the same name was removed. Continue taking this medication, and follow the directions you see here. Changed by: [name], MD   Mens Multivitamin Tabs Take 1 tablet by mouth daily.   propranolol ER 160 MG SR capsule Commonly known as: INDERAL LA Take 1 capsule (160 mg total) by mouth daily.   rosuvastatin 5 MG tablet Commonly known as: CRESTOR Take 1 tablet (5 mg total)  by mouth daily.   zolpidem 12.5 MG CR tablet Commonly known as: AMBIEN CR Take 6.25 mg by mouth at bedtime.       Allergies:  Allergies  Allergen Reactions  . Zoster Vaccine Recombinant, Adjuvanted     Fever.    Past Medical History, Surgical history, Social history, and Family History were reviewed and updated.  Review of Systems: Review of Systems  Constitutional: Negative.   HENT: Negative.   Eyes: Negative.   Respiratory: Negative.   Cardiovascular: Positive for palpitations.  Gastrointestinal: Positive for abdominal pain.  Genitourinary: Negative.   Musculoskeletal: Negative.   Skin: Negative.   Neurological: Negative.   Endo/Heme/Allergies: Negative.   Psychiatric/Behavioral: Negative.      Physical Exam:  weight is 197 lb  (89.4 kg). His temporal temperature is 98 F (36.7 C). His blood pressure is 127/85 and his pulse is 58 (abnormal). His respiration is 16 and oxygen saturation is 100%.   Wt Readings from Last 3 Encounters:  04/14/19 197 lb (89.4 kg)  04/01/19 198 lb 9.6 oz (90.1 kg)  02/18/19 191 lb (86.6 kg)    Physical Exam Vitals signs reviewed.  HENT:     Head: Normocephalic and atraumatic.  Eyes:     Pupils: Pupils are equal, round, and reactive to light.  Neck:     Musculoskeletal: Normal range of motion.  Cardiovascular:     Rate and Rhythm: Normal rate and regular rhythm.     Heart sounds: Normal heart sounds.  Pulmonary:     Effort: Pulmonary effort is normal.     Breath sounds: Normal breath sounds.  Abdominal:     General: Bowel sounds are normal.     Palpations: Abdomen is soft.     Comments: Abdominal exam shows a soft abdomen with good bowel sounds.  He has no fluid wave.  There is no guarding or rebound tenderness.  There is no obvious hepatomegaly.  His spleen tip does feel about 2cm below the left costal margin.  Musculoskeletal: Normal range of motion.        General: No tenderness or deformity.  Lymphadenopathy:     Cervical: No cervical adenopathy.  Skin:    General: Skin is warm and dry.     Findings: No erythema or rash.  Neurological:     Mental Status: He is alert and oriented to person, place, and time.  Psychiatric:        Behavior: Behavior normal.        Thought Content: Thought content normal.        Judgment: Judgment normal.      Lab Results  Component Value Date   WBC 9.6 04/14/2019   HGB 11.3 (L) 04/14/2019   HCT 34.5 (L) 04/14/2019   MCV 86.5 04/14/2019   PLT 272 04/14/2019   Lab Results  Component Value Date   FERRITIN 109 02/16/2019   IRON 159 02/16/2019   TIBC 351 02/16/2019   UIBC 193 02/16/2019   IRONPCTSAT 45 02/16/2019   Lab Results  Component Value Date   RETICCTPCT 3.2 (H) 12/04/2018   RBC 3.99 (L) 04/14/2019   RETICCTABS 87.6  02/21/2011   No results found for: KPAFRELGTCHN, LAMBDASER, KAPLAMBRATIO No results found for: IGGSERUM, IGA, IGMSERUM No results found for: Odetta Pink, SPEI   Chemistry      Component Value Date/Time   NA 140 02/16/2019 1047   NA 144 04/25/2017 1506   NA 139 04/12/2016 1113  K 4.3 02/16/2019 1047   K 4.2 04/25/2017 1506   K 4.1 04/12/2016 1113   CL 106 02/16/2019 1047   CL 105 04/25/2017 1506   CO2 27 02/16/2019 1047   CO2 29 04/25/2017 1506   CO2 22 04/12/2016 1113   BUN 24 (H) 02/16/2019 1047   BUN 17 04/25/2017 1506   BUN 16.3 04/12/2016 1113   CREATININE 1.26 (H) 02/16/2019 1047   CREATININE 1.2 04/25/2017 1506   CREATININE 0.9 04/12/2016 1113      Component Value Date/Time   CALCIUM 9.4 02/16/2019 1047   CALCIUM 9.5 04/25/2017 1506   CALCIUM 9.1 04/12/2016 1113   ALKPHOS 54 02/16/2019 1047   ALKPHOS 73 04/25/2017 1506   ALKPHOS 66 04/12/2016 1113   AST 37 02/16/2019 1047   AST 31 04/12/2016 1113   ALT 62 (H) 02/16/2019 1047   ALT 58 (H) 04/25/2017 1506   ALT 36 04/12/2016 1113   BILITOT 1.0 02/16/2019 1047   BILITOT 0.73 04/12/2016 1113       Impression and Plan: [name] [name] is a very pleasant 63 yo caucasian gentleman with polycythemia vera, JAK2 positive.   I feel confident that the [name] [name] is working.  His spleen still does not seem to be enlarging.  I want to try to move his appointments out a little bit more now.  I do want to get a ultrasound of his abdomen when I see him back.  I will plan to see him back in February.  [name], MD 12/8/202011:52 AM

## 2019-04-15 LAB — IRON AND TIBC
Iron: 190 ug/dL — ABNORMAL HIGH (ref 42–163)
Saturation Ratios: 49 % (ref 20–55)
TIBC: 386 ug/dL (ref 202–409)
UIBC: 197 ug/dL (ref 117–376)

## 2019-04-15 LAB — FERRITIN: Ferritin: 116 ng/mL (ref 24–336)

## 2019-04-30 ENCOUNTER — Other Ambulatory Visit: Payer: Self-pay | Admitting: Adult Health

## 2019-05-04 NOTE — Telephone Encounter (Signed)
Refill will be placed may possibly discontinue in the future.  If he continues to need gabapentin after 4 to 6 months, he will need follow-up appointment scheduled or can follow-up with his PCP

## 2019-05-05 NOTE — Telephone Encounter (Signed)
Called patient and scheduled FU appointment for May.

## 2019-05-06 MED FILL — JAKAFI 10 MG TABLET: 10 | 30 days supply | Qty: 60 | Fill #2

## 2019-05-13 ENCOUNTER — Ambulatory Visit (INDEPENDENT_AMBULATORY_CARE_PROVIDER_SITE_OTHER): Payer: Managed Care, Other (non HMO) | Admitting: Neurology

## 2019-05-13 ENCOUNTER — Other Ambulatory Visit: Payer: Self-pay

## 2019-05-13 ENCOUNTER — Encounter: Payer: Self-pay | Admitting: Neurology

## 2019-05-13 VITALS — BP 131/88 | HR 68 | Temp 97.5°F | Ht 71.5 in | Wt 199.0 lb

## 2019-05-13 DIAGNOSIS — I480 Paroxysmal atrial fibrillation: Secondary | ICD-10-CM

## 2019-05-13 DIAGNOSIS — R0683 Snoring: Secondary | ICD-10-CM | POA: Diagnosis not present

## 2019-05-13 DIAGNOSIS — G4761 Periodic limb movement disorder: Secondary | ICD-10-CM

## 2019-05-13 DIAGNOSIS — E663 Overweight: Secondary | ICD-10-CM | POA: Diagnosis not present

## 2019-05-13 DIAGNOSIS — Z8673 Personal history of transient ischemic attack (TIA), and cerebral infarction without residual deficits: Secondary | ICD-10-CM | POA: Diagnosis not present

## 2019-05-13 DIAGNOSIS — G2581 Restless legs syndrome: Secondary | ICD-10-CM

## 2019-05-13 NOTE — Progress Notes (Signed)
Subjective:    Patient ID: Quennel Fawbush is a 64 y.o. male.  HPI     Star Age, MD, PhD [facility] [address], [address] [place], [place] [address]  Dear Janett Billow,   I saw your patient, Beren Linnemann, upon your kind request in my sleep clinic today for initial consultation of his sleep disorder, in particular, concern for underlying obstructive sleep apnea.  The patient is unaccompanied today.  As you know, Mr. Whatcott is a 64 year old right-handed gentleman with an underlying medical history of stroke, polycythemia, kidney stones, irritable bowel syndrome, hypertension, diverticulosis, paroxysmal A. fib and overweight state, who reports snoring and difficulty with sleep maintenance.  He has been on Ambien long-acting half a pill each night for years.  This is prescribed by his primary care physician.  Sometimes he takes melatonin with it.  Melatonin alone has caused him to not sleep through the night and also vivid dreams.  He denies any gasping sensation or morning headaches.  He denies night to night nocturia or family history of OSA.  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  His weight has been stable, he lost about 10 pounds after his stroke in 2020.  He endorses restless leg symptoms and the need to move his legs prior to fall asleep and also has been told that he twitches and kicks in his sleep by his wife.  I reviewed the office note from 04/01/2019.  His Epworth sleepiness score is 6 out of 24, fatigue severity score is 37 out of 63.  He is a non-smoker and drinks alcohol in the form of beer, 2 to 3/week on average, caffeine in the form of coffee, 1 cup/day on average, rarely a second cup.  He lives with his wife, they have 2 adult daughters, no pets in the house.  He does not have a TV in the bedroom.  His Past Medical History Is Significant For: Past Medical History:  Diagnosis Date  . Diverticulosis of colon (without  mention of hemorrhage)   . Hemorrhoids   . Hypertension   . IBS (irritable bowel syndrome)   . Iron deficiency anemia due to chronic blood loss 09/26/2017  . Irregular heartbeat   . Nephrolithiasis   . Other and unspecified hyperlipidemia   . Perirectal fistula   . Polycythemia, secondary   . Stroke ([place])   . Stye    blocker duct right eye lid    His Past Surgical History Is Significant For: Past Surgical History:  Procedure Laterality Date  . RETINAL DETACHMENT SURGERY    . ROTATOR CUFF REPAIR     rt.  Marland Kitchen VASECTOMY      His Family History Is Significant For: Family History  Problem Relation Age of Onset  . Aortic aneurysm Father   . Heart attack Brother   . Colon cancer Neg Hx   . Esophageal cancer Neg Hx   . Pancreatic cancer Neg Hx   . Prostate cancer Neg Hx   . Rectal cancer Neg Hx   . Stomach cancer Neg Hx     His Social History Is Significant For: Social History   Socioeconomic History  . Marital status: Married    Spouse name: Not on file  . Number of children: 2  . Years of education: Not on file  . Highest education level: Not on file  Occupational History  . Occupation: Lobbyist: [employer]  Tobacco Use  .  Smoking status: Never Smoker  . Smokeless tobacco: Never Used  Substance and Sexual Activity  . Alcohol use: Yes    Alcohol/week: 0.0 standard drinks    Comment: Occasional  . Drug use: No  . Sexual activity: Not on file  Other Topics Concern  . Not on file  Social History Narrative  . Not on file   Social Determinants of Health   Financial Resource Strain:   . Difficulty of Paying Living Expenses: Not on file  Food Insecurity:   . Worried About Charity fundraiser in the Last Year: Not on file  . Ran Out of Food in the Last Year: Not on file  Transportation Needs:   . Lack of Transportation (Medical): Not on file  . Lack of Transportation (Non-Medical): Not on file  Physical Activity:   . Days of Exercise per  Week: Not on file  . Minutes of Exercise per Session: Not on file  Stress:   . Feeling of Stress : Not on file  Social Connections:   . Frequency of Communication with Friends and Family: Not on file  . Frequency of Social Gatherings with Friends and Family: Not on file  . Attends Religious Services: Not on file  . Active Member of Clubs or Organizations: Not on file  . Attends Archivist Meetings: Not on file  . Marital Status: Not on file    His Allergies Are:  Allergies  Allergen Reactions  . Zoster Vaccine Recombinant, Adjuvanted     Fever.  :   His Current Medications Are:  Outpatient Encounter Medications as of 05/13/2019  Medication Sig  . acetaminophen (TYLENOL) 500 MG tablet Take 1,000 mg by mouth every 6 (six) hours as needed for mild pain.   Marland Kitchen aspirin EC 81 MG EC tablet Take 1 tablet (81 mg total) by mouth daily. (Patient taking differently: Take 81 mg by mouth daily. 1/2 tablet daily)  . buPROPion (WELLBUTRIN SR) 150 MG 12 hr tablet Take 150 mg by mouth every morning.  Marland Kitchen ELIQUIS 5 MG TABS tablet TAKE 1 TABLET BY MOUTH TWICE A DAY  . gabapentin (NEURONTIN) 300 MG capsule Take 1 capsule (300 mg total) by mouth at bedtime.  Marland Kitchen JAKAFI 10 MG tablet TAKE 1 TABLET (10 MG TOTAL) BY MOUTH 2 (TWO) TIMES DAILY.  Marland Kitchen losartan (COZAAR) 50 MG tablet Take 50 mg by mouth daily.  . Multiple Vitamins-Minerals (MENS MULTIVITAMIN) TABS Take 1 tablet by mouth daily.  . propranolol (INNOPRAN XL) 80 MG 24 hr capsule TAKE 1 CAPSULE BY MOUTH EVERY DAY  . propranolol ER (INDERAL LA) 160 MG SR capsule Take 1 capsule (160 mg total) by mouth daily.  . rosuvastatin (CRESTOR) 5 MG tablet Take 1 tablet (5 mg total) by mouth daily.  Marland Kitchen zolpidem (AMBIEN CR) 12.5 MG CR tablet Take 6.25 mg by mouth at bedtime.   . [DISCONTINUED] fluticasone (FLONASE) 50 MCG/ACT nasal spray Place 1 spray into both nostrils daily as needed for allergies.    No facility-administered encounter medications on file as of  05/13/2019.  :  Review of Systems:  Out of a complete 14 point review of systems, all are reviewed and negative with the exception of these symptoms as listed below: Review of Systems  Constitutional: Positive for fatigue.  Cardiovascular:       Heart mumur  Gastrointestinal:       IBS  Endocrine:       Flushing Feeling cold  Musculoskeletal:  Joint pain         Skin:       itching  Neurological: Positive for numbness and headaches.       Restless legs Epworth Sleepiness Scale 0= would never doze 1= slight chance of dozing 2= moderate chance of dozing 3= high chance of dozing  Sitting and reading:1 Watching TV:2 Sitting inactive in a public place (ex. Theater or meeting):1 As a passenger in a car for an hour without a break:0 Lying down to rest in the afternoon:1 Sitting and talking to someone:0 Sitting quietly after lunch (no alcohol):1 In a car, while stopped in traffic:1 Total:6    Psychiatric/Behavioral:       Anxiet Not enough sleep Racing thougths    Objective:  Neurological Exam  Physical Exam Physical Examination:   Vitals:   05/13/19 1548  BP: 131/88  Pulse: 68  Temp: (!) 97.5 F (36.4 C)    General Examination: The patient is a very pleasant 64 y.o. male in no acute distress. He appears well-developed and well-nourished and very well groomed.   HEENT: Normocephalic, atraumatic, pupils are equal, round and reactive to light, extraocular tracking is good without limitation to gaze excursion or nystagmus noted. Hearing is grossly intact. Face is symmetric with normal facial animation. Speech is clear with no dysarthria noted. There is no hypophonia. There is no lip, neck/head, jaw or voice tremor. Neck is supple with full range of passive and active motion. There are no carotid bruits on auscultation. Oropharynx exam reveals: mild mouth dryness, adequate dental hygiene and mild airway crowding, due to Larger uvula, tonsils are on the smaller  side, Mallampati class II, tongue protrudes centrally and palate elevates symmetrically, neck circumference is 16-1/8 inches.  He has a minimal overbite.  Chest: Clear to auscultation without wheezing, rhonchi or crackles noted.  Heart: S1+S2+0, regular and normal without murmurs, rubs or gallops noted.   Abdomen: Soft, non-tender and non-distended with normal bowel sounds appreciated on auscultation.  Extremities: There is no pitting edema in the distal lower extremities bilaterally.   Skin: Warm and dry without trophic changes noted.   Musculoskeletal: exam reveals no obvious joint deformities, tenderness or joint swelling or erythema.   Neurologically:  Mental status: The patient is awake, alert and oriented in all 4 spheres. His immediate and remote memory, attention, language skills and fund of knowledge are appropriate. There is no evidence of aphasia, agnosia, apraxia or anomia. Speech is clear with normal prosody and enunciation. Thought process is linear. Mood is normal and affect is normal.  Cranial nerves II - XII are as described above under HEENT exam.  Motor exam: Normal bulk, strength and tone is noted. There is no tremor, Romberg is negative. Fine motor skills and coordination: grossly intact.  Cerebellar testing: No dysmetria or intention tremor. There is no truncal or gait ataxia.  Sensory exam: intact to light touch in the upper and lower extremities.  Gait, station and balance: He stands easily. No veering to one side is noted. No leaning to one side is noted. Posture is age-appropriate and stance is narrow based. Gait shows normal stride length and normal pace. No problems turning are noted. Tandem walk is unremarkable.                Assessment and Plan:  In summary, Javius Lathem is a very pleasant 64 y.o.-year old male with an underlying medical history of stroke, polycythemia, kidney stones, irritable bowel syndrome, hypertension, diverticulosis, paroxysmal A. fib and  overweight state, whose history and physical exam are concerning for obstructive sleep apnea (OSA). I had a long chat with the patient about my findings and the diagnosis of OSA, its prognosis and treatment options. We talked about medical treatments, surgical interventions and non-pharmacological approaches. I explained in particular the risks and ramifications of untreated moderate to severe OSA, especially with respect to developing cardiovascular disease down the Road, including congestive heart failure, difficult to treat hypertension, cardiac arrhythmias, or stroke. Even type 2 diabetes has, in part, been linked to untreated OSA. Symptoms of untreated OSA include daytime sleepiness, memory problems, mood irritability and mood disorder such as depression and anxiety, lack of energy, as well as recurrent headaches, especially morning headaches. We talked about trying to maintain a healthy lifestyle in general, as well as the importance of weight control. We also talked about the importance of good sleep hygiene. I recommended the following at this time: sleep study.   I explained the sleep test procedure to the patient and also outlined possible surgical and non-surgical treatment options of OSA, including the use of a custom-made dental device (which would require a referral to a specialist dentist or oral surgeon), upper airway surgical options, such as traditional UPPP or a novel less invasive surgical option in the form of Inspire hypoglossal nerve stimulation (which would involve a referral to an ENT surgeon). I also explained the CPAP treatment option to the patient, who indicated that he would be willing to try CPAP if the need arises. I explained the importance of being compliant with PAP treatment, not only for insurance purposes but primarily to improve His symptoms, and for the patient's long term health benefit, including to reduce His cardiovascular risks. I answered all his questions today and  the patient was in agreement. I plan to see him back after the sleep study is completed and encouraged him to call with any interim questions, concerns, problems or updates.   Thank you very much for allowing me to participate in the care of this nice patient. If I can be of any further assistance to you please do not hesitate to talk to me.  Sincerely,   Star Age, MD, PhD

## 2019-05-13 NOTE — Patient Instructions (Signed)

## 2019-05-14 ENCOUNTER — Telehealth: Payer: Self-pay | Admitting: Neurology

## 2019-05-14 NOTE — Telephone Encounter (Signed)
Caren Griffins from [facility] called in and stated she received the order for a sleep study on the patient but hasnt received any office notes  CB# [phone] EXT [phone]  FAX# [phone]

## 2019-06-08 MED FILL — JAKAFI 10 MG TABLET: 10 | 30 days supply | Qty: 60 | Fill #3

## 2019-06-13 ENCOUNTER — Ambulatory Visit (HOSPITAL_BASED_OUTPATIENT_CLINIC_OR_DEPARTMENT_OTHER)
Admission: RE | Admit: 2019-06-13 | Discharge: 2019-06-13 | Disposition: A | Discharge status: Home / Self Care | Payer: Managed Care, Other (non HMO) | Source: Ambulatory Visit | Attending: Hematology & Oncology | Admitting: Hematology & Oncology

## 2019-06-13 ENCOUNTER — Other Ambulatory Visit: Payer: Self-pay

## 2019-06-13 ENCOUNTER — Other Ambulatory Visit: Payer: Self-pay | Admitting: Cardiology

## 2019-06-13 DIAGNOSIS — R002 Palpitations: Secondary | ICD-10-CM

## 2019-06-13 DIAGNOSIS — D751 Secondary polycythemia: Secondary | ICD-10-CM

## 2019-06-15 ENCOUNTER — Encounter: Payer: Self-pay | Admitting: *Deleted

## 2019-06-17 ENCOUNTER — Ambulatory Visit: Payer: Managed Care, Other (non HMO) | Admitting: Hematology & Oncology

## 2019-06-17 ENCOUNTER — Other Ambulatory Visit: Payer: Managed Care, Other (non HMO)

## 2019-06-23 ENCOUNTER — Inpatient Hospital Stay (HOSPITAL_BASED_OUTPATIENT_CLINIC_OR_DEPARTMENT_OTHER): Payer: Managed Care, Other (non HMO) | Admitting: Hematology & Oncology

## 2019-06-23 ENCOUNTER — Encounter: Payer: Self-pay | Admitting: Hematology & Oncology

## 2019-06-23 ENCOUNTER — Inpatient Hospital Stay: Payer: Managed Care, Other (non HMO) | Attending: Hematology & Oncology

## 2019-06-23 ENCOUNTER — Other Ambulatory Visit: Payer: Self-pay

## 2019-06-23 VITALS — BP 138/62 | HR 60 | Temp 97.5°F | Resp 20 | Wt 202.1 lb

## 2019-06-23 DIAGNOSIS — D751 Secondary polycythemia: Secondary | ICD-10-CM | POA: Diagnosis not present

## 2019-06-23 DIAGNOSIS — R002 Palpitations: Secondary | ICD-10-CM | POA: Diagnosis not present

## 2019-06-23 DIAGNOSIS — D508 Other iron deficiency anemias: Secondary | ICD-10-CM | POA: Diagnosis not present

## 2019-06-23 DIAGNOSIS — R109 Unspecified abdominal pain: Secondary | ICD-10-CM | POA: Insufficient documentation

## 2019-06-23 DIAGNOSIS — Z7901 Long term (current) use of anticoagulants: Secondary | ICD-10-CM | POA: Diagnosis not present

## 2019-06-23 DIAGNOSIS — R21 Rash and other nonspecific skin eruption: Secondary | ICD-10-CM | POA: Insufficient documentation

## 2019-06-23 DIAGNOSIS — R161 Splenomegaly, not elsewhere classified: Secondary | ICD-10-CM | POA: Insufficient documentation

## 2019-06-23 DIAGNOSIS — D45 Polycythemia vera: Secondary | ICD-10-CM | POA: Diagnosis present

## 2019-06-23 DIAGNOSIS — Z79899 Other long term (current) drug therapy: Secondary | ICD-10-CM | POA: Diagnosis not present

## 2019-06-23 LAB — CBC WITH DIFFERENTIAL (CANCER CENTER ONLY)
Abs Immature Granulocytes: 0.58 10*3/uL — ABNORMAL HIGH (ref 0.00–0.07)
Basophils Absolute: 0.2 10*3/uL — ABNORMAL HIGH (ref 0.0–0.1)
Basophils Relative: 2 %
Eosinophils Absolute: 0.4 10*3/uL (ref 0.0–0.5)
Eosinophils Relative: 3 %
HCT: 35.7 % — ABNORMAL LOW (ref 39.0–52.0)
Hemoglobin: 11.8 g/dL — ABNORMAL LOW (ref 13.0–17.0)
Immature Granulocytes: 5 %
Lymphocytes Relative: 14 %
Lymphs Abs: 1.6 10*3/uL (ref 0.7–4.0)
MCH: 28.7 pg (ref 26.0–34.0)
MCHC: 33.1 g/dL (ref 30.0–36.0)
MCV: 86.9 fL (ref 80.0–100.0)
Monocytes Absolute: 1 10*3/uL (ref 0.1–1.0)
Monocytes Relative: 9 %
Neutro Abs: 7.1 10*3/uL (ref 1.7–7.7)
Neutrophils Relative %: 67 %
Platelet Count: 322 10*3/uL (ref 150–400)
RBC: 4.11 MIL/uL — ABNORMAL LOW (ref 4.22–5.81)
RDW: 17.6 % — ABNORMAL HIGH (ref 11.5–15.5)
WBC Count: 10.8 10*3/uL — ABNORMAL HIGH (ref 4.0–10.5)
nRBC: 1.6 % — ABNORMAL HIGH (ref 0.0–0.2)

## 2019-06-23 LAB — CMP (CANCER CENTER ONLY)
ALT: 44 U/L (ref 0–44)
AST: 39 U/L (ref 15–41)
Albumin: 5.1 g/dL — ABNORMAL HIGH (ref 3.5–5.0)
Alkaline Phosphatase: 44 U/L (ref 38–126)
Anion gap: 6 (ref 5–15)
BUN: 19 mg/dL (ref 8–23)
CO2: 31 mmol/L (ref 22–32)
Calcium: 9.7 mg/dL (ref 8.9–10.3)
Chloride: 104 mmol/L (ref 98–111)
Creatinine: 1.03 mg/dL (ref 0.61–1.24)
GFR, Est AFR Am: >60 mL/min (ref >60)
GFR, Estimated: >60 mL/min (ref >60)
Glucose, Bld: 99 mg/dL (ref 70–99)
Potassium: 4.1 mmol/L (ref 3.5–5.1)
Sodium: 141 mmol/L (ref 135–145)
Total Bilirubin: 1.3 mg/dL — ABNORMAL HIGH (ref 0.3–1.2)
Total Protein: 6.4 g/dL — ABNORMAL LOW (ref 6.5–8.1)

## 2019-06-23 LAB — LACTATE DEHYDROGENASE: LDH: 686 U/L — ABNORMAL HIGH (ref 98–192)

## 2019-06-23 NOTE — Progress Notes (Signed)
Hematology and Oncology Follow Up Visit  [name] [name] [id number] 08-Sep-1955 64 y.o. 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     Interim History:  [name] [name] is here today for follow-up.  Overall, he is doing quite nicely.  He had a very quiet Christmas.  He had a Zoom Christmas with his family.  His 2 daughters live in other parts of the country.  He is doing well.  He is going to retire at the end of this year.  I am so happy for him.  We did a ultrasound of his abdomen on February 6.  This showed that his spleen was still shrinking in size.  Since we started [name] [name], his spleen probably has shrunk by about 50%.  He has had no problems with the [facility].  He has had no nausea or vomiting.  He had does have a little bit of a skin rash.  I will try him on doxycycline and will see if this can help.  He has had no cough.  He has had a couple palpitations.  This is not unusual for him.  There is been no problems with his bowels or bladder.  He has had no fever.  Overall, his performance status is ECOG 0.  Medications:  Allergies as of 06/23/2019      Reactions   Zoster Vaccine Recombinant, Adjuvanted    Fever.      Medication List       Accurate as of June 23, 2019 12:04 PM. If you have any questions, ask your nurse or doctor.        STOP taking these medications   propranolol 80 MG 24 hr capsule Commonly known as: INNOPRAN XL Stopped by: [name], MD     TAKE these medications   acetaminophen 500 MG tablet Commonly known as: TYLENOL Take 1,000 mg  by mouth every 6 (six) hours as needed for mild pain.   aspirin 81 MG EC tablet Take 1 tablet (81 mg total) by mouth daily.   buPROPion 150 MG 12 hr tablet Commonly known as: WELLBUTRIN SR Take 150 mg by mouth every morning.   Eliquis 5 MG Tabs tablet Generic drug: apixaban TAKE 1 TABLET BY MOUTH TWICE A DAY   gabapentin 300 MG capsule Commonly known as: NEURONTIN Take 1 capsule (300 mg total) by mouth at bedtime.   Jakafi 10 MG tablet Generic drug: ruxolitinib phosphate TAKE 1 TABLET (10 MG TOTAL) BY MOUTH 2 (TWO) TIMES DAILY.   losartan 50 MG tablet Commonly known as: COZAAR Take 50 mg by mouth daily.   Mens Multivitamin Tabs Take 1 tablet by mouth daily.   propranolol ER 160 MG SR capsule Commonly known as: INDERAL LA TAKE 1 CAPSULE (160 MG TOTAL) BY MOUTH DAILY.   rosuvastatin 5 MG tablet Commonly known as: CRESTOR Take 1 tablet (5 mg total) by mouth daily.   zolpidem 12.5 MG CR tablet Commonly known as: AMBIEN CR Take 6.25 mg by mouth at bedtime.       Allergies:  Allergies  Allergen Reactions  .  Zoster Vaccine Recombinant, Adjuvanted     Fever.    Past Medical History, Surgical history, Social history, and Family History were reviewed and updated.  Review of Systems: Review of Systems  Constitutional: Negative.   HENT: Negative.   Eyes: Negative.   Respiratory: Negative.   Cardiovascular: Positive for palpitations.  Gastrointestinal: Positive for abdominal pain.  Genitourinary: Negative.   Musculoskeletal: Negative.   Skin: Negative.   Neurological: Negative.   Endo/Heme/Allergies: Negative.   Psychiatric/Behavioral: Negative.      Physical Exam:  weight is 202 lb 1.9 oz (91.7 kg). His temporal temperature is 97.5 F (36.4 C) (abnormal). His blood pressure is 138/62 and his pulse is 60. His respiration is 20 and oxygen saturation is 100%.   Wt Readings from Last 3 Encounters:  06/23/19 202 lb 1.9 oz (91.7 kg)  05/13/19 199 lb (90.3 kg)    04/14/19 197 lb (89.4 kg)    Physical Exam Vitals reviewed.  HENT:     Head: Normocephalic and atraumatic.  Eyes:     Pupils: Pupils are equal, round, and reactive to light.  Cardiovascular:     Rate and Rhythm: Normal rate and regular rhythm.     Heart sounds: Normal heart sounds.  Pulmonary:     Effort: Pulmonary effort is normal.     Breath sounds: Normal breath sounds.  Abdominal:     General: Bowel sounds are normal.     Palpations: Abdomen is soft.     Comments: Abdominal exam shows a soft abdomen with good bowel sounds.  He has no fluid wave.  There is no guarding or rebound tenderness.  There is no obvious hepatomegaly.  His spleen tip does feel about 2cm below the left costal margin.  Musculoskeletal:        General: No tenderness or deformity. Normal range of motion.     Cervical back: Normal range of motion.  Lymphadenopathy:     Cervical: No cervical adenopathy.  Skin:    General: Skin is warm and dry.     Findings: No erythema or rash.  Neurological:     Mental Status: He is alert and oriented to person, place, and time.  Psychiatric:        Behavior: Behavior normal.        Thought Content: Thought content normal.        Judgment: Judgment normal.      Lab Results  Component Value Date   WBC 10.8 (H) 06/23/2019   HGB 11.8 (L) 06/23/2019   HCT 35.7 (L) 06/23/2019   MCV 86.9 06/23/2019   PLT 322 06/23/2019   Lab Results  Component Value Date   FERRITIN 116 04/14/2019   IRON 190 (H) 04/14/2019   TIBC 386 04/14/2019   UIBC 197 04/14/2019   IRONPCTSAT 49 04/14/2019   Lab Results  Component Value Date   RETICCTPCT 3.2 (H) 12/04/2018   RBC 4.11 (L) 06/23/2019   RETICCTABS 87.6 02/21/2011   No results found for: KPAFRELGTCHN, LAMBDASER, KAPLAMBRATIO No results found for: IGGSERUM, IGA, IGMSERUM No results found for: Ronnald Ramp, A1GS, A2GS, Violet Baldy, MSPIKE, SPEI   Chemistry      Component Value Date/Time   NA 141  06/23/2019 1106   NA 144 04/25/2017 1506   NA 139 04/12/2016 1113   K 4.1 06/23/2019 1106   K 4.2 04/25/2017 1506   K 4.1 04/12/2016 1113   CL 104 06/23/2019 1106   CL 105 04/25/2017 1506   CO2 31  06/23/2019 1106   CO2 29 04/25/2017 1506   CO2 22 04/12/2016 1113   BUN 19 06/23/2019 1106   BUN 17 04/25/2017 1506   BUN 16.3 04/12/2016 1113   CREATININE 1.03 06/23/2019 1106   CREATININE 1.2 04/25/2017 1506   CREATININE 0.9 04/12/2016 1113      Component Value Date/Time   CALCIUM 9.7 06/23/2019 1106   CALCIUM 9.5 04/25/2017 1506   CALCIUM 9.1 04/12/2016 1113   ALKPHOS 44 06/23/2019 1106   ALKPHOS 73 04/25/2017 1506   ALKPHOS 66 04/12/2016 1113   AST 39 06/23/2019 1106   AST 31 04/12/2016 1113   ALT 44 06/23/2019 1106   ALT 58 (H) 04/25/2017 1506   ALT 36 04/12/2016 1113   BILITOT 1.3 (H) 06/23/2019 1106   BILITOT 0.73 04/12/2016 1113       Impression and Plan: Mr. Frankfort is a very pleasant 64 yo caucasian gentleman with polycythemia vera, JAK2 positive.   I feel confident that the Shanon Brow is working.  His spleen still is responding.  I did happy that the splenomegaly is much less prominent now.    I want to try to move his appointments out a little bit more now.  I will plan to see him back in May.  I do not think we have to do another ultrasound probably for 6 months.   [name], MD 2/16/202112:04 PM

## 2019-06-24 LAB — IRON AND TIBC
Iron: 168 ug/dL — ABNORMAL HIGH (ref 42–163)
Saturation Ratios: 42 % (ref 20–55)
TIBC: 396 ug/dL (ref 202–409)
UIBC: 228 ug/dL (ref 117–376)

## 2019-06-24 LAB — FERRITIN: Ferritin: 111 ng/mL (ref 24–336)

## 2019-07-06 MED FILL — JAKAFI 10 MG TABLET: 10 | 30 days supply | Qty: 60 | Fill #4

## 2019-07-08 ENCOUNTER — Ambulatory Visit (INDEPENDENT_AMBULATORY_CARE_PROVIDER_SITE_OTHER): Payer: Managed Care, Other (non HMO) | Admitting: Neurology

## 2019-07-08 ENCOUNTER — Other Ambulatory Visit: Payer: Self-pay

## 2019-07-08 DIAGNOSIS — I480 Paroxysmal atrial fibrillation: Secondary | ICD-10-CM | POA: Diagnosis not present

## 2019-07-08 DIAGNOSIS — G2581 Restless legs syndrome: Secondary | ICD-10-CM

## 2019-07-08 DIAGNOSIS — G4761 Periodic limb movement disorder: Secondary | ICD-10-CM

## 2019-07-08 DIAGNOSIS — E663 Overweight: Secondary | ICD-10-CM

## 2019-07-08 DIAGNOSIS — Z8673 Personal history of transient ischemic attack (TIA), and cerebral infarction without residual deficits: Secondary | ICD-10-CM

## 2019-07-08 DIAGNOSIS — G4733 Obstructive sleep apnea (adult) (pediatric): Secondary | ICD-10-CM | POA: Diagnosis not present

## 2019-07-08 DIAGNOSIS — R0683 Snoring: Secondary | ICD-10-CM

## 2019-07-10 NOTE — Addendum Note (Signed)
Addended by: Star Age on: 07/10/2019 01:03 PM   Modules accepted: Orders

## 2019-07-10 NOTE — Procedures (Signed)
Patient Information     First Name: [name] Last Name: [name] ID: [id number]  Birth Date: 1955/10/06 Age: 64 Gender: Male  Referring Provider: [name], NP BMI: 27.8 (W=198 lb, H=5' 11'')  Neck Circ.:  16 '' Epworth:  6/24   Sleep Study Information    Study Date: Jul 08, 2019 S/H/A Version: 001.001.001.001 / 4.1.1528 / 28  History:    64 year old man with a history of stroke, polycythemia, kidney stones, irritable bowel syndrome, hypertension, diverticulosis, paroxysmal A. fib and overweight state, who reports snoring and difficulty with sleep maintenance.   Summary & Diagnosis:     Mild OSA Recommendations:     This home sleep test demonstrates overall mild obstructive sleep apnea with a total AHI of 10.4/hour and O2 nadir of 87%. Given the patient's medical history and sleep related complaints, treatment with positive airway pressure is recommended. This can be achieved in the form of autoPAP trial/titration at home. A full night CPAP titration study will help with proper treatment settings and mask fitting if needed. Alternative treatments include weight loss along with avoidance of the supine sleep position, or an oral appliance in appropriate candidates.   Please note that untreated obstructive sleep apnea may carry additional perioperative morbidity. Patients with significant obstructive sleep apnea should receive perioperative PAP therapy and the surgeons and particularly the anesthesiologist should be informed of the diagnosis and the severity of the sleep disordered breathing. The patient should be cautioned not to drive, work at heights, or operate dangerous or heavy equipment when tired or sleepy. Review and reiteration of good sleep hygiene measures should be pursued with any patient. Other causes of the patient's symptoms, including circadian rhythm disturbances, an underlying mood disorder, medication effect and/or an underlying medical problem cannot be ruled out based on this test.  Clinical correlation is recommended.   The patient and his referring provider will be notified of the test results. The patient will be seen in follow up in sleep clinic at [facility].  I certify that I have reviewed the raw data recording prior to the issuance of this report in accordance with the standards of the American Academy of Sleep Medicine (AASM).  Star Age, MD, PhD [facility]) Diplomat, ABPN (Neurology and Sleep)               Sleep Summary  Oxygen Saturation Statistics   Start Study Time: End Study Time: Total Recording Time:          10:23:31 PM           5:38:20 AM   7 h, 14 min  Total Sleep Time % REM of Sleep Time:  5 h, 44 min  32.1    Mean: 94 Minimum: 87 Maximum: 99  Mean of Desaturations Nadirs (%):   91  Oxygen Desaturation. %: 4-9 10-20 >20 Total  Events Number Total  28 100.0  0 0.0  0 0.0  28 100.0  Oxygen Saturation: <90 <=88 <85 <80 <70  Duration (minutes): Sleep % 0.3 0.2 0.1 0.1 0.0 0.0 0.0 0.0 0.0 0.0     Respiratory Indices      Total Events REM NREM All Night  pRDI:  66  pAHI:  59 ODI:  28  pAHIc:  6  % CSR: 0.0 17.6 14.3 5.5 3.1 8.8 8.5 4.7 0.3 11.6 10.4 4.9 1.2       Pulse Rate Statistics during Sleep (BPM)      Mean: 58 Minimum: 39 Maximum:  86    Indices are calculated using technically valid sleep time of  5 h, 40 min. Central-Indices are calculated using technically valid sleep time of  5  h, 3 min. pRDI/pAHI are calculated using oxi desaturations ? 3%   Body Position Statistics  Position Supine Prone Right Left Non-Supine  Sleep (min) 76.0 0.0 157.5 110.5 268.0  Sleep % 22.1 0.0 45.8 32.1 77.9  pRDI 39.1 N/A 4.6 2.7 3.8  pAHI 37.5 N/A 3.5 1.7 2.7  ODI 19.2 N/A 0.8 1.1 0.9     Snoring Statistics Snoring Level (dB) >40 >50 >60 >70 >80 >Threshold (45)  Sleep (min) 13.0 3.9 1.7 0.0 0.0 5.3  Sleep % 3.8 1.1 0.5 0.0 0.0 1.5    Mean: 40 dB Sleep Stages Chart                 pAHI=10.4                            Mild              Moderate                    Severe                                                 5              15                    30

## 2019-07-10 NOTE — Progress Notes (Signed)
Patient referred by [name], NP, seen by me on 05/13/19, HST on 07/08/19.    Please call and notify the patient that the recent home sleep test showed obstructive sleep apnea. OSA is overall mild, but worth treating, in light of his medical Hx of stroke and Hx of A fib and to see if he feels better after treatment. To that end I recommend treatment for this in the form of autoPAP, which means, that we don't have to bring him in for a sleep study with CPAP, but will let him try an autoPAP machine at home, through a DME company (of his choice, or as per insurance requirement). The DME representative will educate him on how to use the machine, how to put the mask on, etc. I have placed an order in the chart. Please send referral, talk to patient, send report to referring MD. We will need a FU in sleep clinic for 10 weeks post-PAP set up, please arrange that with me or one of our NPs. Thanks,   Star Age, MD, PhD [facility])

## 2019-07-15 ENCOUNTER — Telehealth: Payer: Self-pay

## 2019-07-15 NOTE — Telephone Encounter (Signed)
I reached out to the pt and advised of sleep study results. Pt verbalized understanding and requested a few days to think over the auto pap and then will call back to let us know he decision. Pt was advised we would wait to hear from him before sending the order.

## 2019-07-27 ENCOUNTER — Other Ambulatory Visit: Payer: Self-pay | Admitting: Hematology & Oncology

## 2019-07-27 DIAGNOSIS — D751 Secondary polycythemia: Secondary | ICD-10-CM

## 2019-08-05 MED FILL — JAKAFI 10 MG TABLET: 10 | 30 days supply | Qty: 60 | Fill #0

## 2019-08-26 NOTE — Progress Notes (Signed)
HPI: FU PAF. Stress echocardiogram June 2011 normal. TSH at that time also normal. Monitor February 2020 revealed sinus bradycardia, NSR, sinus tach, pacs, PVC, brief PAT and PAF.Echo May 2020 showed normal LV function, mild diastolic dysfunction, mild left ventricular hypertrophy and negative saline microcavitation study.Admitted with CVA May 2020. 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 Aspirin added to apixaban by neurologyas CVA occurred on apixaban.Patient was seen in follow-up by Dr. Marin Olp and it was felt that polycythemia may be contributing to his recent CVA. Since last seenoccasional sharp pain in chest but no exertional symptoms.  He denies dyspnea on exertion or syncope.  Occasional brief palpitations improved compared to previous.  Current Outpatient Medications  Medication Sig Dispense Refill  . acetaminophen (TYLENOL) 500 MG tablet Take 1,000 mg by mouth every 6 (six) hours as needed for mild pain.     Marland Kitchen aspirin EC 81 MG EC tablet Take 1 tablet (81 mg total) by mouth daily. 60 tablet 0  . buPROPion (WELLBUTRIN SR) 150 MG 12 hr tablet Take 150 mg by mouth every morning.  12  . ELIQUIS 5 MG TABS tablet TAKE 1 TABLET BY MOUTH TWICE A DAY 180 tablet 1  . gabapentin (NEURONTIN) 300 MG capsule Take 1 capsule (300 mg total) by mouth at bedtime. 30 capsule 5  . JAKAFI 10 MG tablet TAKE 1 TABLET (10 MG TOTAL) BY MOUTH 2 (TWO) TIMES DAILY. 60 tablet 4  . losartan (COZAAR) 50 MG tablet Take 50 mg by mouth daily.    . Multiple Vitamins-Minerals (MENS MULTIVITAMIN) TABS Take 1 tablet by mouth daily.    . propranolol ER (INDERAL LA) 160 MG SR capsule TAKE 1 CAPSULE (160 MG TOTAL) BY MOUTH DAILY. 90 capsule 2  . zolpidem (AMBIEN CR) 12.5 MG CR tablet Take 6.25 mg by mouth at bedtime.     . rosuvastatin  (CRESTOR) 5 MG tablet Take 1 tablet (5 mg total) by mouth daily. 90 tablet 3   No current facility-administered medications for this visit.     Past Medical History:  Diagnosis Date  . Diverticulosis of colon (without mention of hemorrhage)   . Hemorrhoids   . Hypertension   . IBS (irritable bowel syndrome)   . Iron deficiency anemia due to chronic blood loss 09/26/2017  . Irregular heartbeat   . Nephrolithiasis   . Other and unspecified hyperlipidemia   . Perirectal fistula   . Polycythemia, secondary   . Stroke ([place])   . Stye    blocker duct right eye lid    Past Surgical History:  Procedure Laterality Date  . RETINAL DETACHMENT SURGERY    . ROTATOR CUFF REPAIR     rt.  Marland Kitchen VASECTOMY      Social History   Socioeconomic History  . Marital status: Married    Spouse name: Not on file  . Number of children: 2  . Years of education: Not on file  . Highest education level: Not on file  Occupational History  . Occupation: Lobbyist: [employer]  Tobacco Use  . Smoking status: Never Smoker  . Smokeless tobacco: Never Used  Substance and Sexual Activity  . Alcohol use: Yes    Alcohol/week: 0.0 standard drinks    Comment: Occasional  . Drug use: No  . Sexual activity: Not on file  Other  Topics Concern  . Not on file  Social History Narrative  . Not on file   Social Determinants of Health   Financial Resource Strain:   . Difficulty of Paying Living Expenses:   Food Insecurity:   . Worried About Charity fundraiser in the Last Year:   . Arboriculturist in the Last Year:   Transportation Needs:   . Film/video editor (Medical):   Marland Kitchen Lack of Transportation (Non-Medical):   Physical Activity:   . Days of Exercise per Week:   . Minutes of Exercise per Session:   Stress:   . Feeling of Stress :   Social Connections:   . Frequency of Communication with Friends and Family:   . Frequency of Social Gatherings with Friends and Family:   .  Attends Religious Services:   . Active Member of Clubs or Organizations:   . Attends Archivist Meetings:   Marland Kitchen Marital Status:   Intimate Partner Violence:   . Fear of Current or Ex-Partner:   . Emotionally Abused:   Marland Kitchen Physically Abused:   . Sexually Abused:     Family History  Problem Relation Age of Onset  . Aortic aneurysm Father   . Heart attack Brother   . Colon cancer Neg Hx   . Esophageal cancer Neg Hx   . Pancreatic cancer Neg Hx   . Prostate cancer Neg Hx   . Rectal cancer Neg Hx   . Stomach cancer Neg Hx     ROS: no fevers or chills, productive cough, hemoptysis, dysphasia, odynophagia, melena, hematochezia, dysuria, hematuria, rash, seizure activity, orthopnea, PND, pedal edema, claudication. Remaining systems are negative.  Physical Exam: Well-developed well-nourished in no acute distress.  Skin is warm and dry.  HEENT is normal.  Neck is supple.  Chest is clear to auscultation with normal expansion.  Cardiovascular exam is regular rate and rhythm.  Abdominal exam nontender or distended. No masses palpated. Extremities show no edema. neuro grossly intact  ECG-normal sinus rhythm at a rate of 63, no ST changes.  Personally reviewed  A/P  1 paroxysmal atrial fibrillation-patient has not had recurrences.  Continue Inderal at present dose.  Continue apixaban.  Note his previous CVA occurred while on apixaban alone.  We have therefore continued low-dose aspirin.  2 hypertension-blood pressure controlled.  Continue present medical regimen.  3 hyperlipidemia-continue Crestor at present dose.    4 question fibromuscular dysplasia on previous CTA-we will arrange renal Dopplers to exclude fibromuscular dysplasia.  5 polycythemia vera-Per oncology.  6 prior CVA-follow-up neurology.  Kirk Ruths, MD

## 2019-09-02 ENCOUNTER — Ambulatory Visit: Payer: Managed Care, Other (non HMO) | Admitting: Cardiology

## 2019-09-02 ENCOUNTER — Other Ambulatory Visit: Payer: Self-pay

## 2019-09-02 ENCOUNTER — Encounter: Payer: Self-pay | Admitting: Cardiology

## 2019-09-02 VITALS — BP 120/84 | HR 63 | Ht 71.0 in | Wt 200.0 lb

## 2019-09-02 DIAGNOSIS — I480 Paroxysmal atrial fibrillation: Secondary | ICD-10-CM | POA: Diagnosis not present

## 2019-09-02 DIAGNOSIS — Z136 Encounter for screening for cardiovascular disorders: Secondary | ICD-10-CM | POA: Diagnosis not present

## 2019-09-02 DIAGNOSIS — I10 Essential (primary) hypertension: Secondary | ICD-10-CM | POA: Diagnosis not present

## 2019-09-02 DIAGNOSIS — E7800 Pure hypercholesterolemia, unspecified: Secondary | ICD-10-CM | POA: Diagnosis not present

## 2019-09-02 DIAGNOSIS — I773 Arterial fibromuscular dysplasia: Secondary | ICD-10-CM

## 2019-09-02 MED FILL — JAKAFI 10 MG TABLET: 10 | 30 days supply | Qty: 60 | Fill #1

## 2019-09-02 NOTE — Patient Instructions (Signed)
Medication Instructions:  NO CHANGE *If you need a refill on your cardiac medications before your next appointment, please call your pharmacy*   Lab Work: If you have labs (blood work) drawn today and your tests are completely normal, you will receive your results only by: Marland Kitchen MyChart Message (if you have MyChart) OR . A paper copy in the mail If you have any lab test that is abnormal or we need to change your treatment, we will call you to review the results.   Testing/Procedures: CARDIAC CALCIUM SCORE CT SCAN AT [place]  Your physician has requested that you have a renal artery duplex. During this test, an ultrasound is used to evaluate blood flow to the kidneys. Allow one hour for this exam. Do not eat after midnight the day before and avoid carbonated beverages. Take your medications as you usually do. [facility] [facility]-1ST FLOOR IMAGING     Follow-Up: At [facility], you and your health needs are our priority.  As part of our continuing mission to provide you with exceptional heart care, we have created designated Provider Care Teams.  These Care Teams include your primary Cardiologist (physician) and Advanced Practice Providers (APPs -  Physician Assistants and Nurse Practitioners) who all work together to provide you with the care you need, when you need it.  We recommend signing up for the patient portal called "MyChart".  Sign up information is provided on this After Visit Summary.  MyChart is used to connect with patients for Virtual Visits (Telemedicine).  Patients are able to view lab/test results, encounter notes, upcoming appointments, etc.  Non-urgent messages can be sent to your provider as well.   To learn more about what you can do with MyChart, go to [email or web address].    Your next appointment:   12 month(s)  The format for your next appointment:   Either In Person or Virtual  Provider:   Kirk Ruths, MD

## 2019-09-08 ENCOUNTER — Ambulatory Visit (HOSPITAL_BASED_OUTPATIENT_CLINIC_OR_DEPARTMENT_OTHER)
Admission: RE | Admit: 2019-09-08 | Discharge: 2019-09-08 | Disposition: A | Discharge status: Home / Self Care | Payer: Managed Care, Other (non HMO) | Source: Ambulatory Visit | Attending: Cardiology | Admitting: Cardiology

## 2019-09-08 DIAGNOSIS — I773 Arterial fibromuscular dysplasia: Secondary | ICD-10-CM | POA: Diagnosis present

## 2019-09-09 ENCOUNTER — Encounter: Payer: Self-pay | Admitting: Adult Health

## 2019-09-09 ENCOUNTER — Ambulatory Visit (INDEPENDENT_AMBULATORY_CARE_PROVIDER_SITE_OTHER)
Admission: RE | Admit: 2019-09-09 | Discharge: 2019-09-09 | Disposition: A | Discharge status: Home / Self Care | Payer: Managed Care, Other (non HMO) | Source: Ambulatory Visit | Attending: Cardiology | Admitting: Cardiology

## 2019-09-09 ENCOUNTER — Ambulatory Visit: Payer: Managed Care, Other (non HMO) | Admitting: Adult Health

## 2019-09-09 ENCOUNTER — Other Ambulatory Visit: Payer: Self-pay

## 2019-09-09 VITALS — BP 142/76 | HR 72 | Temp 97.5°F | Ht 71.0 in | Wt 202.4 lb

## 2019-09-09 DIAGNOSIS — E782 Mixed hyperlipidemia: Secondary | ICD-10-CM

## 2019-09-09 DIAGNOSIS — G4730 Sleep apnea, unspecified: Secondary | ICD-10-CM | POA: Diagnosis not present

## 2019-09-09 DIAGNOSIS — Z136 Encounter for screening for cardiovascular disorders: Secondary | ICD-10-CM

## 2019-09-09 DIAGNOSIS — I480 Paroxysmal atrial fibrillation: Secondary | ICD-10-CM

## 2019-09-09 DIAGNOSIS — Z8673 Personal history of transient ischemic attack (TIA), and cerebral infarction without residual deficits: Secondary | ICD-10-CM

## 2019-09-09 DIAGNOSIS — I10 Essential (primary) hypertension: Secondary | ICD-10-CM

## 2019-09-09 MED ORDER — GABAPENTIN 300 MG PO CAPS: 600.0000 mg | ORAL_CAPSULE | Freq: Every day | ORAL | 5 refills | Status: DC

## 2019-09-09 NOTE — Progress Notes (Signed)
[facility] [address] [place]. [place] [address] [phone]       OFFICE FOLLOW UP NOTE  [name] Date of Birth:  [birth year] Medical Record Number:  [id number]   Reason for Referral: stroke follow up   CHIEF COMPLAINT:  Chief Complaint  Patient presents with  . Stroke    6 month FU "my hands fall asleep mostly at night; loss of sensation in feet; periodic headaches-not in one certain area; sinus issues lately"    HPI:   Today, 09/09/2019, [name] [name] returns for stroke follow-up.  He has been stable from a stroke standpoint without new or recurrent stroke/TIA symptoms.  He did undergo home sleep study which did show mild sleep apnea and recommended treatment of CPAP in light of his medical history of stroke and history of atrial fibrillation along with ongoing complaints of daytime fatigue.  CPAP not initiated at this time as he is questioning need of treatment especially as apnea mild.  Continues on Eliquis, aspirin and atorvastatin 40 mg daily for secondary stroke prevention.  Blood pressure today 142/76.  He continues to have complaints of bilateral lower extremity numbness/tingling with nerve pain greater at night as well as right hand numbness/tingling - has not underwent EMG/NCV that was previously ordered.  He has continued on gabapentin 300 mg daily with mild benefit and tolerating well.  He also endorses increased headaches with sinus issues.  No further concerns at this time.    History provided for reference purposes only 12/29/2018 update JM: [name] [name] is a 64 year old male who is being seen today for 41-month follow-up visit.  He has been doing well from a stroke standpoint without residual deficits.  He does come in today with a long list of chronic complaints including tingling/burning sensation in feet bilaterally with tight/cramping sensation.  Tingling/numbness right hand, puffiness under left eye, odd feelings or sensations with at  times lightheaded lasting for less than 1 minute and then resolve, occasional balance difficulties, occasional headaches, and decreased concentration/focus towards end of the day.  All symptoms have been present over the past several years and denies worsening with recent stroke.  He has not had prior work-up regarding numbness/tingling or nerve pain complaints.  He has not previously been on medication therapy for nerve pain.  Pain is typically worsened at night while he is sitting and relaxing.  He also endorses occasional cold sensation of lower extremities and right hand.  He does take Ambien 6.25 mg and melatonin 5 mg nightly due to insomnia.  He will occasionally wake up throughout the night with at times difficulty returning to sleep.  He does endorse daytime fatigue but does not typically stop to take a nap.  He has not previously underwent sleep study.  He continues on Eliquis 5 mg twice daily and aspirin half tab daily due to experiencing bright red blood with bowel movements and lasted for approximately 2 to 3 days approximately 3 weeks ago.  He stopped use of aspirin initially with resolution of symptoms and restarted half tab without recurrent symptoms.  Continues on aspirin from cardiac standpoint.  Continues on atorvastatin 40 mg daily.  Blood pressure today 120/69.  No further concerns at this time.  10/28/2018 virtual visit JM: he has been stable from a stroke standpoint without residual deficits  He has had 2 additional episodes of right hand and foot cold sensation which lasted approximately 1 hour -denies weakness, numbness or tingling Decreased energy level which is slowly improving ??  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 male with history of polycythemia, hypertension, PVCs, brief PAT and paroxysmal atrial fibrillation seen on monitor in February 2020-on Eliquis,   who presented with right upper and lower extremity paresthesias.  He did not receive IV t-PA due to anticoagulation and late presentation (>4.5 hours from time of onset).  Stroke work-up revealed subcentimeter acute/late subacute infarction within the cortex of the left superior precentral gyrus likely cardioembolic.  Recommended continuation of Eliquis and potentially adding aspirin 81 mg which was recommended to be decided by cardiology.  HTN stable.  LDL 76 and recommended atorvastatin 40 mg daily.  Other stroke risk factors include advanced age, EtOH use, history of stroke and atrial fibrillation.  He was discharged home in stable condition without therapy needs.       ROS:   14 system review of systems performed and negative with exception of see HPI  PMH:  Past Medical History:  Diagnosis Date  . Diverticulosis of colon (without mention of hemorrhage)   . Hemorrhoids   . Hypertension   . IBS (irritable bowel syndrome)   . Iron deficiency anemia due to chronic blood loss 09/26/2017  . Irregular heartbeat   . Nephrolithiasis   . Other and unspecified hyperlipidemia   . Perirectal fistula   . Polycythemia, secondary   . Stroke ([place])   . Stye    blocker duct right eye lid    PSH:  Past Surgical History:  Procedure Laterality Date  . RETINAL DETACHMENT SURGERY    . ROTATOR CUFF REPAIR     rt.  Marland Kitchen VASECTOMY      Social History:  Social History   Socioeconomic History  .  Marital status: Married    Spouse name: Not on file  . Number of children: 2  . Years of education: Not on file  . Highest education level: Not on file  Occupational History  . Occupation: Lobbyist: [employer]  Tobacco Use  . Smoking status: Never Smoker  . Smokeless tobacco: Never Used  Substance and Sexual Activity  . Alcohol use: Yes    Alcohol/week: 0.0 standard drinks    Comment: Occasional  . Drug use: No  . Sexual activity: Not on file  Other Topics Concern  . Not on file  Social History Narrative  . Not on file   Social Determinants of Health   Financial Resource Strain:   . Difficulty of Paying Living Expenses:   Food Insecurity:   . Worried About Charity fundraiser in the Last Year:   . Arboriculturist in the Last Year:   Transportation Needs:   . Film/video editor (Medical):   Marland Kitchen Lack of Transportation (Non-Medical):   Physical Activity:   . Days of Exercise per Week:   . Minutes of Exercise per Session:   Stress:   .  Feeling of Stress :   Social Connections:   . Frequency of Communication with Friends and Family:   . Frequency of Social Gatherings with Friends and Family:   . Attends Religious Services:   . Active Member of Clubs or Organizations:   . Attends Archivist Meetings:   Marland Kitchen Marital Status:   Intimate Partner Violence:   . Fear of Current or Ex-Partner:   . Emotionally Abused:   Marland Kitchen Physically Abused:   . Sexually Abused:     Family History:  Family History  Problem Relation Age of Onset  . Aortic aneurysm Father   . Heart attack Brother   . Cerebral palsy Brother   . Colon cancer Neg Hx   . Esophageal cancer Neg Hx   . Pancreatic cancer Neg Hx   . Prostate cancer Neg Hx   . Rectal cancer Neg Hx   . Stomach cancer Neg Hx     Medications:   Current Outpatient Medications on File Prior to Visit  Medication Sig Dispense Refill  . acetaminophen (TYLENOL) 500 MG tablet Take 1,000 mg by mouth every 6  (six) hours as needed for mild pain.     Marland Kitchen aspirin EC 81 MG EC tablet Take 1 tablet (81 mg total) by mouth daily. 60 tablet 0  . buPROPion (WELLBUTRIN SR) 150 MG 12 hr tablet Take 150 mg by mouth every morning.  12  . ELIQUIS 5 MG TABS tablet TAKE 1 TABLET BY MOUTH TWICE A DAY 180 tablet 1  . JAKAFI 10 MG tablet TAKE 1 TABLET (10 MG TOTAL) BY MOUTH 2 (TWO) TIMES DAILY. 60 tablet 4  . losartan (COZAAR) 50 MG tablet Take 50 mg by mouth daily.    . Multiple Vitamins-Minerals (MENS MULTIVITAMIN) TABS Take 1 tablet by mouth daily.    . propranolol ER (INDERAL LA) 160 MG SR capsule TAKE 1 CAPSULE (160 MG TOTAL) BY MOUTH DAILY. 90 capsule 2  . zolpidem (AMBIEN CR) 12.5 MG CR tablet Take 6.25 mg by mouth at bedtime.     . rosuvastatin (CRESTOR) 5 MG tablet Take 1 tablet (5 mg total) by mouth daily. 90 tablet 3   No current facility-administered medications on file prior to visit.    Allergies:   Allergies  Allergen Reactions  . Zoster Vaccine Recombinant, Adjuvanted Other (See Comments)    Fever. Fever.     Physical Exam  Today's Vitals   09/09/19 1438  BP: (!) 142/76  Pulse: 72  Temp: (!) 97.5 F (36.4 C)  Weight: 202 lb 6.4 oz (91.8 kg)  Height: 5\' 11"  (1.803 m)   Body mass index is 28.23 kg/m.  General: well developed, well nourished,  pleasant middle-age Caucasian male, seated, in no evident distress Head: head normocephalic and atraumatic.   Neck: supple with no carotid or supraclavicular bruits Cardiovascular: regular rate and rhythm, no murmurs Musculoskeletal: no deformity Skin:  no rash/petichiae Vascular:  Normal pulses all extremities   Neurologic Exam Mental Status: Awake and fully alert.   Fluent speech and language.  Oriented to place and time. Recent and remote memory intact. Attention span, concentration and fund of knowledge appropriate. Mood and affect appropriate.  Cranial Nerves: Fundoscopic exam reveals sharp disc margins. Pupils equal, briskly reactive to  light. Extraocular movements full without nystagmus. Visual fields full to confrontation. Hearing intact. Facial sensation intact. Face, tongue, palate moves normally and symmetrically.  Motor: Normal bulk and tone. Normal strength in all tested extremity muscles. Sensory.: intact to  touch , pinprick , position and vibratory sensation.  Coordination: Rapid alternating movements normal in all extremities. Finger-to-nose and heel-to-shin performed accurately bilaterally. Gait and Station: Arises from chair without difficulty. Stance is normal. Gait demonstrates normal stride length and balance Reflexes: 1+ and symmetric. Toes downgoing.        ASSESSMENT: [name] [name] is a 65 y.o. 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 Vascular risk factors include polycythemia, HTN, HLD, atrial fibrillation and prior stroke.  Stable from stroke standpoint without residual deficits.  He continues to experience bilateral lower extremity paresthesias and right hand numbness/tingling.    PLAN:  1. Cardioembolic stroke : Continue aspirin 81 mg daily and Eliquis (apixaban) daily  and lipitor 40mg   for secondary stroke prevention. Maintain strict control of hypertension with blood pressure goal below 130/90, diabetes with hemoglobin A1c goal below 6.5% and cholesterol with LDL cholesterol (bad cholesterol) goal below 70 mg/dL.  I also advised the patient to eat a healthy diet with plenty of whole grains, cereals, fruits and vegetables, exercise regularly with at least 30 minutes of continuous activity daily and maintain ideal body weight. 2. HTN: Stable.  Continue to follow with PCP for monitoring management 3. HLD: Continue atorvastatin and ongoing follow-up with PCP for prescribing, monitoring and management 4. Atrial fibrillation: Ongoing follow-up with cardiology for Eliquis and atrial fibrillation management 5. Mild  OSA: follow up with dr. Rexene Alberts regarding use of dental device vs use of CPAP -he plans on discussing further with his wife and will call office when decision is made 6. Neuropathic pains: chronic symptoms - needs to schedule EMG/NCV - recommend increasing gabapentin dosage to 600mg  nightly    Follow-up will be determined after above recommendations completed   I spent 33 minutes of face-to-face and non-face-to-face time with patient.  This included previsit chart review, lab review, study review, order entry, electronic health record documentation, patient education regarding prior history of stroke, importance of managing stroke risk factors, importance of sleep apnea treatment, ongoing neuropathic pains and answered all questions to patient satisfaction     Venancio Poisson, AGNP-BC  [facility] [address] [place], [place] [address]  Phone [phone] Fax [phone] Note: This document was prepared with digital dictation and possible smart phrase technology. Any transcriptional errors that result from this process are unintentional.

## 2019-09-09 NOTE — Patient Instructions (Addendum)
Continue aspirin 81 mg daily and Eliquis (apixaban) daily  and lipitor  for secondary stroke prevention  Continue to follow up with PCP regarding cholesterol and blood pressure management   Follow up with Dr. Rexene Alberts regarding use of dental device vs starting CPAP for sleep apnea management  Follow up with your PCP regarding worsening allergies and sinus headaches   Schedule appointment for EMG/NCV for ongoing nerve pain  Increase gabapentin dosage to 600mg  nightly (2 capsules)   Continue to monitor blood pressure at home  Maintain strict control of hypertension with blood pressure goal below 130/90, diabetes with hemoglobin A1c goal below 6.5% and cholesterol with LDL cholesterol (bad cholesterol) goal below 70 mg/dL. I also advised the patient to eat a healthy diet with plenty of whole grains, cereals, fruits and vegetables, exercise regularly and maintain ideal body weight.         Thank you for coming to see [place] at [facility]. I hope we have been able to provide you high quality care today.  You may receive a patient satisfaction survey over the next few weeks. We would appreciate your feedback and comments so that we may continue to improve ourselves and the health of our patients.

## 2019-09-11 ENCOUNTER — Other Ambulatory Visit: Payer: Self-pay | Admitting: *Deleted

## 2019-09-11 DIAGNOSIS — E7800 Pure hypercholesterolemia, unspecified: Secondary | ICD-10-CM

## 2019-09-11 MED ORDER — ROSUVASTATIN CALCIUM 20 MG PO TABS
20.0000 mg | ORAL_TABLET | Freq: Every day | ORAL | 3 refills | Status: DC
Start: 2019-09-11 — End: 2020-03-10

## 2019-09-11 NOTE — Progress Notes (Signed)
I agree with the above plan 

## 2019-09-30 ENCOUNTER — Encounter: Payer: Managed Care, Other (non HMO) | Admitting: Neurology

## 2019-10-01 ENCOUNTER — Inpatient Hospital Stay (HOSPITAL_BASED_OUTPATIENT_CLINIC_OR_DEPARTMENT_OTHER): Payer: Managed Care, Other (non HMO) | Admitting: Hematology & Oncology

## 2019-10-01 ENCOUNTER — Other Ambulatory Visit: Payer: Self-pay

## 2019-10-01 ENCOUNTER — Encounter: Payer: Self-pay | Admitting: Hematology & Oncology

## 2019-10-01 ENCOUNTER — Inpatient Hospital Stay: Payer: Managed Care, Other (non HMO) | Attending: Hematology & Oncology

## 2019-10-01 VITALS — BP 123/76 | HR 61 | Temp 97.7°F | Resp 20 | Wt 200.0 lb

## 2019-10-01 DIAGNOSIS — I2510 Atherosclerotic heart disease of native coronary artery without angina pectoris: Secondary | ICD-10-CM | POA: Diagnosis not present

## 2019-10-01 DIAGNOSIS — Z7901 Long term (current) use of anticoagulants: Secondary | ICD-10-CM | POA: Insufficient documentation

## 2019-10-01 DIAGNOSIS — Z79899 Other long term (current) drug therapy: Secondary | ICD-10-CM | POA: Insufficient documentation

## 2019-10-01 DIAGNOSIS — R109 Unspecified abdominal pain: Secondary | ICD-10-CM | POA: Insufficient documentation

## 2019-10-01 DIAGNOSIS — D751 Secondary polycythemia: Secondary | ICD-10-CM

## 2019-10-01 DIAGNOSIS — R002 Palpitations: Secondary | ICD-10-CM | POA: Diagnosis not present

## 2019-10-01 DIAGNOSIS — D45 Polycythemia vera: Secondary | ICD-10-CM | POA: Insufficient documentation

## 2019-10-01 LAB — CMP (CANCER CENTER ONLY)
ALT: 40 U/L (ref 0–44)
AST: 44 U/L — ABNORMAL HIGH (ref 15–41)
Albumin: 5.1 g/dL — ABNORMAL HIGH (ref 3.5–5.0)
Alkaline Phosphatase: 46 U/L (ref 38–126)
Anion gap: 8 (ref 5–15)
BUN: 24 mg/dL — ABNORMAL HIGH (ref 8–23)
CO2: 28 mmol/L (ref 22–32)
Calcium: 10 mg/dL (ref 8.9–10.3)
Chloride: 107 mmol/L (ref 98–111)
Creatinine: 1.08 mg/dL (ref 0.61–1.24)
GFR, Est AFR Am: >60 mL/min (ref >60)
GFR, Estimated: >60 mL/min (ref >60)
Glucose, Bld: 79 mg/dL (ref 70–99)
Potassium: 4.3 mmol/L (ref 3.5–5.1)
Sodium: 143 mmol/L (ref 135–145)
Total Bilirubin: 1.3 mg/dL — ABNORMAL HIGH (ref 0.3–1.2)
Total Protein: 6.9 g/dL (ref 6.5–8.1)

## 2019-10-01 LAB — CBC WITH DIFFERENTIAL (CANCER CENTER ONLY)
Abs Immature Granulocytes: 0.57 10*3/uL — ABNORMAL HIGH (ref 0.00–0.07)
Basophils Absolute: 0.3 10*3/uL — ABNORMAL HIGH (ref 0.0–0.1)
Basophils Relative: 3 %
Eosinophils Absolute: 0.4 10*3/uL (ref 0.0–0.5)
Eosinophils Relative: 4 %
HCT: 36.2 % — ABNORMAL LOW (ref 39.0–52.0)
Hemoglobin: 12 g/dL — ABNORMAL LOW (ref 13.0–17.0)
Immature Granulocytes: 5 %
Lymphocytes Relative: 15 %
Lymphs Abs: 1.6 10*3/uL (ref 0.7–4.0)
MCH: 28.9 pg (ref 26.0–34.0)
MCHC: 33.1 g/dL (ref 30.0–36.0)
MCV: 87.2 fL (ref 80.0–100.0)
Monocytes Absolute: 1 10*3/uL (ref 0.1–1.0)
Monocytes Relative: 9 %
Neutro Abs: 7.1 10*3/uL (ref 1.7–7.7)
Neutrophils Relative %: 64 %
Platelet Count: 358 10*3/uL (ref 150–400)
RBC: 4.15 MIL/uL — ABNORMAL LOW (ref 4.22–5.81)
RDW: 17.5 % — ABNORMAL HIGH (ref 11.5–15.5)
WBC Count: 11 10*3/uL — ABNORMAL HIGH (ref 4.0–10.5)
nRBC: 2.2 % — ABNORMAL HIGH (ref 0.0–0.2)

## 2019-10-01 LAB — SAVE SMEAR(SSMR), FOR PROVIDER SLIDE REVIEW

## 2019-10-01 NOTE — Progress Notes (Signed)
Hematology and Oncology Follow Up Visit  [name] [name] [id number] 12-11-1955 64 y.o. 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     Interim History:  [name] is here today for follow-up.  Overall, he is doing quite nicely.  We now see him every 3 months.  This is working out quite nicely for him.  He is doing nicely on [place].  He has had no problems with toxicity.  He has had a cardiac evaluation.  He had a cardiac CT scan.  It showed that he had some coronary artery calcifications.  He had his Crestor dose increased.  He also had a renal ultrasound.  Did not look like there is any fibromuscular dysplasia.  He has had no problems with work.  He is looking forward to retiring at the end of the year.  He has had no fever.  He is no cough or shortness of breath.  There is been no chest wall pain.  There has been no nausea or vomiting.  There has been no change in bowel or bladder habits.  Overall, his performance status is ECOG 1.   Medications:  Allergies as of 10/01/2019      Reactions   Zoster Vaccine Recombinant, Adjuvanted Other (See Comments)   Fever. Fever.      Medication List       Accurate as of Oct 01, 2019  3:21 PM. If you have any questions, ask your nurse or doctor.        acetaminophen 500 MG tablet Commonly known as: TYLENOL Take 1,000 mg by mouth every 6 (six) hours as needed for mild pain.   aspirin 81 MG EC tablet Take 1 tablet (81 mg total) by mouth daily.   buPROPion 150 MG 12 hr tablet Commonly known as: WELLBUTRIN SR Take 150 mg  by mouth every morning.   Eliquis 5 MG Tabs tablet Generic drug: apixaban TAKE 1 TABLET BY MOUTH TWICE A DAY   gabapentin 300 MG capsule Commonly known as: NEURONTIN Take 2 capsules (600 mg total) by mouth at bedtime.   Jakafi 10 MG tablet Generic drug: ruxolitinib phosphate TAKE 1 TABLET (10 MG TOTAL) BY MOUTH 2 (TWO) TIMES DAILY.   losartan 50 MG tablet Commonly known as: COZAAR Take 50 mg by mouth daily.   Mens Multivitamin Tabs Take 1 tablet by mouth daily.   propranolol ER 160 MG SR capsule Commonly known as: INDERAL LA TAKE 1 CAPSULE (160 MG TOTAL) BY MOUTH DAILY.   rosuvastatin 20 MG tablet Commonly known as: CRESTOR Take 1 tablet (20 mg total) by mouth daily.   zolpidem 12.5 MG CR tablet Commonly known as: AMBIEN CR Take 6.25 mg by mouth at bedtime.       Allergies:  Allergies  Allergen Reactions  . Zoster Vaccine Recombinant, Adjuvanted Other (See Comments)    Fever. Fever.    Past Medical History, Surgical history, Social history, and Family History were reviewed and updated.  Review of Systems: Review of Systems  Constitutional: Negative.   HENT: Negative.   Eyes: Negative.  Respiratory: Negative.   Cardiovascular: Positive for palpitations.  Gastrointestinal: Positive for abdominal pain.  Genitourinary: Negative.   Musculoskeletal: Negative.   Skin: Negative.   Neurological: Negative.   Endo/Heme/Allergies: Negative.   Psychiatric/Behavioral: Negative.      Physical Exam:  weight is 200 lb (90.7 kg). His temporal temperature is 97.7 F (36.5 C). His blood pressure is 123/76 and his pulse is 61. His respiration is 20 and oxygen saturation is 98%.   Wt Readings from Last 3 Encounters:  10/01/19 200 lb (90.7 kg)  09/09/19 202 lb 6.4 oz (91.8 kg)  09/02/19 200 lb (90.7 kg)    Physical Exam Vitals reviewed.  HENT:     Head: Normocephalic and atraumatic.  Eyes:     Pupils: Pupils are equal, round, and reactive to light.    Cardiovascular:     Rate and Rhythm: Normal rate and regular rhythm.     Heart sounds: Normal heart sounds.  Pulmonary:     Effort: Pulmonary effort is normal.     Breath sounds: Normal breath sounds.  Abdominal:     General: Bowel sounds are normal.     Palpations: Abdomen is soft.     Comments: Abdominal exam shows a soft abdomen with good bowel sounds.  He has no fluid wave.  There is no guarding or rebound tenderness.  There is no obvious hepatomegaly.  His spleen tip does feel about 2cm below the left costal margin.  Musculoskeletal:        General: No tenderness or deformity. Normal range of motion.     Cervical back: Normal range of motion.  Lymphadenopathy:     Cervical: No cervical adenopathy.  Skin:    General: Skin is warm and dry.     Findings: No erythema or rash.  Neurological:     Mental Status: He is alert and oriented to person, place, and time.  Psychiatric:        Behavior: Behavior normal.        Thought Content: Thought content normal.        Judgment: Judgment normal.      Lab Results  Component Value Date   WBC 11.0 (H) 10/01/2019   HGB 12.0 (L) 10/01/2019   HCT 36.2 (L) 10/01/2019   MCV 87.2 10/01/2019   PLT 358 10/01/2019   Lab Results  Component Value Date   FERRITIN 111 06/23/2019   IRON 168 (H) 06/23/2019   TIBC 396 06/23/2019   UIBC 228 06/23/2019   IRONPCTSAT 42 06/23/2019   Lab Results  Component Value Date   RETICCTPCT 3.2 (H) 12/04/2018   RBC 4.15 (L) 10/01/2019   RETICCTABS 87.6 02/21/2011   No results found for: KPAFRELGTCHN, LAMBDASER, KAPLAMBRATIO No results found for: IGGSERUM, IGA, IGMSERUM No results found for: Odetta Pink, SPEI   Chemistry      Component Value Date/Time   NA 143 10/01/2019 1437   NA 144 04/25/2017 1506   NA 139 04/12/2016 1113   K 4.3 10/01/2019 1437   K 4.2 04/25/2017 1506   K 4.1 04/12/2016 1113   CL 107 10/01/2019 1437   CL 105 04/25/2017  1506   CO2 28 10/01/2019 1437   CO2 29 04/25/2017 1506   CO2 22 04/12/2016 1113   BUN 24 (H) 10/01/2019 1437   BUN 17 04/25/2017 1506   BUN 16.3 04/12/2016 1113   CREATININE 1.08 10/01/2019 1437   CREATININE 1.2 04/25/2017 1506   CREATININE 0.9  04/12/2016 1113      Component Value Date/Time   CALCIUM 10.0 10/01/2019 1437   CALCIUM 9.5 04/25/2017 1506   CALCIUM 9.1 04/12/2016 1113   ALKPHOS 46 10/01/2019 1437   ALKPHOS 73 04/25/2017 1506   ALKPHOS 66 04/12/2016 1113   AST 44 (H) 10/01/2019 1437   AST 31 04/12/2016 1113   ALT 40 10/01/2019 1437   ALT 58 (H) 04/25/2017 1506   ALT 36 04/12/2016 1113   BILITOT 1.3 (H) 10/01/2019 1437   BILITOT 0.73 04/12/2016 1113       Impression and Plan: Mr. Rumbaugh is a very pleasant 64 yo caucasian gentleman with polycythemia vera, JAK2 positive.   I feel confident that the Shanon Brow is working.  When we see him back in 3 months, we will plan for another ultrasound on him.  He and his family are going to the beach in about 3 weeks.  He is looking forward to this.  I told him to make sure he wears sunscreen and drinks a lot of water.  We will plan for 58-month follow-up.  [name], MD 5/27/20213:21 PM

## 2019-10-02 LAB — LACTATE DEHYDROGENASE: LDH: 677 U/L — ABNORMAL HIGH (ref 98–192)

## 2019-10-08 ENCOUNTER — Telehealth: Payer: Self-pay | Admitting: Pharmacy Technician

## 2019-10-08 MED FILL — JAKAFI 10 MG TABLET: 10 | 30 days supply | Qty: 60 | Fill #2

## 2019-10-08 NOTE — Telephone Encounter (Signed)
Oral Oncology Patient Advocate Encounter  Received notification from [employer] [facility]) that prior authorization for Shanon Brow has expired and is up for renewal.  PA submitted on over the phone through [place]. Case # [id number] Status is pending  Oral Oncology Clinic will continue to follow.  [place] Patient [place] Phone [phone] Fax [phone] 10/08/2019 2:03 PM

## 2019-10-12 NOTE — Telephone Encounter (Signed)
Oral Oncology Patient Advocate Encounter  Prior Authorization for Jakafi (Ruxolitinib) has been approved.    PA# [id number] Effective dates: 10/12/19 through 10/07/20  Patients co-pay is $60.00.  Patient has a copay card that reduces copay to $0.00.  Oral Oncology Clinic will continue to follow.   [place] Patient [place] Phone [phone] Fax [phone] 10/12/2019 11:44 AM

## 2019-10-13 ENCOUNTER — Ambulatory Visit: Payer: Managed Care, Other (non HMO) | Admitting: Neurology

## 2019-10-13 ENCOUNTER — Ambulatory Visit (INDEPENDENT_AMBULATORY_CARE_PROVIDER_SITE_OTHER): Payer: Managed Care, Other (non HMO) | Admitting: Neurology

## 2019-10-13 ENCOUNTER — Encounter: Payer: Self-pay | Admitting: Neurology

## 2019-10-13 DIAGNOSIS — R202 Paresthesia of skin: Secondary | ICD-10-CM | POA: Diagnosis not present

## 2019-10-13 DIAGNOSIS — I639 Cerebral infarction, unspecified: Secondary | ICD-10-CM

## 2019-10-13 NOTE — Progress Notes (Signed)
[place]    Nerve / Sites Muscle Latency Ref. Amplitude Ref. Rel Amp Segments Distance Velocity Ref. Area    ms ms mV mV %  cm m/s m/s mVms  R Median - APB     Wrist APB 4.0 ?4.4 8.5 ?4.0 100 Wrist - APB 7   33.6     Upper arm APB 8.6  8.1  95.1 Upper arm - Wrist 24 53 ?49 33.1  L Median - APB     Wrist APB 4.3 ?4.4 7.7 ?4.0 100 Wrist - APB 7   27.6     Upper arm APB 8.9  7.6  99 Upper arm - Wrist 24 52 ?49 27.1  R Ulnar - ADM     Wrist ADM 2.9 ?3.3 14.6 ?6.0 100 Wrist - ADM 7   42.6     B.Elbow ADM 6.8  13.2  90.7 B.Elbow - Wrist 22 57 ?49 41.1     A.Elbow ADM 8.5  13.3  100 A.Elbow - B.Elbow 10 59 ?49 42.3  L Ulnar - ADM     Wrist ADM 2.9 ?3.3 15.1 ?6.0 100 Wrist - ADM 7   38.9     B.Elbow ADM 6.6  13.9  92.2 B.Elbow - Wrist 21 56 ?49 37.4     A.Elbow ADM 8.4  13.7  98.7 A.Elbow - B.Elbow 10 56 ?49 37.3  R Peroneal - EDB     Ankle EDB 4.4 ?6.5 7.8 ?2.0 100 Ankle - EDB 9   26.1     Fib head EDB 12.0  6.8  87.3 Fib head - Ankle 33 44 ?44 24.3     Pop fossa EDB 14.3  6.3  92.9 Pop fossa - Fib head 10 44 ?44 22.5         Pop fossa - Ankle      R Tibial - AH     Ankle AH 3.6 ?5.8 9.7 ?4.0 100 Ankle - AH 9   22.4     Pop fossa AH 14.5  4.2  43.6 Pop fossa - Ankle 44 41 ?41 10.4                  SNC    Nerve / Sites Rec. Site Peak Lat Ref.  Amp Ref. Segments Distance Peak Diff Ref.    ms ms V V  cm ms ms  R Sural - Ankle (Calf)     Calf Ankle 3.6 ?4.4 11 ?6 Calf - Ankle 14    R Superficial peroneal - Ankle     Lat leg Ankle 4.1 ?4.4 6 ?6 Lat leg - Ankle 14    R Median, Ulnar - Transcarpal comparison     Median Palm Wrist 2.3 ?2.2 75 ?35 Median Palm - Wrist 8       Ulnar Palm Wrist 2.0 ?2.2 27 ?12 Ulnar Palm - Wrist 8          Median Palm - Ulnar Palm  0.3 ?0.4  L Median, Ulnar - Transcarpal comparison     Median Palm Wrist 2.4 ?2.2 78 ?35 Median Palm - Wrist 8       Ulnar Palm Wrist 2.0 ?2.2 24 ?12 Ulnar Palm - Wrist 8          Median Palm - Ulnar Palm  0.4 ?0.4  R Median -  Orthodromic (Dig II, Mid palm)     Dig II Wrist 3.4 ?3.4 14 ?10 Dig II - Wrist 13    L Median -  Orthodromic (Dig II, Mid palm)     Dig II Wrist 3.4 ?3.4 20 ?10 Dig II - Wrist 13    R Ulnar - Orthodromic, (Dig V, Mid palm)     Dig V Wrist 2.8 ?3.1 8 ?5 Dig V - Wrist 11    L Ulnar - Orthodromic, (Dig V, Mid palm)     Dig V Wrist 2.8 ?3.1 7 ?5 Dig V - Wrist 31                       F  Wave    Nerve F Lat Ref.   ms ms  R Tibial - AH 55.2 ?56.0  R Ulnar - ADM 30.5 ?32.0  L Ulnar - ADM 30.7 ?32.0

## 2019-10-13 NOTE — Procedures (Signed)
HISTORY:  Tedford Berg is a 64 year old gentleman with a history of numbness and dysesthesias in the feet and hands dating back several years.  The patient does report some neck discomfort.  He has symptoms that are a bit worse on the left than the right.  He is being evaluated for a possible neuropathy or a cervical radiculopathy.  NERVE CONDUCTION STUDIES:  Nerve conduction studies were performed on both upper extremities. The distal motor latencies and motor amplitudes for the median and ulnar nerves were within normal limits. The nerve conduction velocities for these nerves were also normal. The sensory latencies for the median and ulnar nerves were normal. The F wave latencies for the ulnar nerves were within normal limits.  Nerve conduction studies were performed on the right lower extremity. The distal motor latencies and motor amplitudes for the peroneal and posterior tibial nerves were within normal limits. The nerve conduction velocities for these nerves were also normal. The sensory latencies for the peroneal and sural nerves were within normal limits. The F wave latency for the posterior tibial nerve was within normal limits.   EMG STUDIES:  EMG study was performed on the right upper extremity:  The first dorsal interosseous muscle reveals 2 to 4 K units with full recruitment. No fibrillations or positive waves were noted. The abductor pollicis brevis muscle reveals 2 to 4 K units with full recruitment. No fibrillations or positive waves were noted. The extensor indicis proprius muscle reveals 1 to 3 K units with full recruitment. No fibrillations or positive waves were noted. The pronator teres muscle reveals 2 to 3 K units with full recruitment. No fibrillations or positive waves were noted. The biceps muscle reveals 1 to 2 K units with full recruitment. No fibrillations or positive waves were noted. The triceps muscle reveals 2 to 4 K units with full recruitment. No  fibrillations or positive waves were noted. The anterior deltoid muscle reveals 2 to 3 K units with full recruitment. No fibrillations or positive waves were noted. The cervical paraspinal muscles were tested at 2 levels. No abnormalities of insertional activity were seen at either level tested. There was fair relaxation.  EMG study was performed on the left upper extremity:  The first dorsal interosseous muscle reveals 2 to 4 K units with full recruitment. No fibrillations or positive waves were noted. The abductor pollicis brevis muscle reveals 2 to 4 K units with full recruitment. No fibrillations or positive waves were noted. The extensor indicis proprius muscle reveals 1 to 3 K units with full recruitment. No fibrillations or positive waves were noted. The pronator teres muscle reveals 2 to 3 K units with full recruitment. No fibrillations or positive waves were noted. The biceps muscle reveals 1 to 2 K units with full recruitment. No fibrillations or positive waves were noted. The triceps muscle reveals 2 to 4 K units with full recruitment. No fibrillations or positive waves were noted. The anterior deltoid muscle reveals 2 to 3 K units with full recruitment. No fibrillations or positive waves were noted. The cervical paraspinal muscles were tested at 2 levels. No abnormalities of insertional activity were seen at either level tested. There was good relaxation.   IMPRESSION:  Nerve conduction studies were performed on both upper extremities and on the right lower extremity.  The studies were unremarkable, there is no evidence of an underlying peripheral neuropathy.  A small fiber neuropathy cannot be excluded however, clinical correlation is required.  EMG evaluation of the  upper extremities were within normal limits, no evidence of an overlying cervical radiculopathy is seen.  Jill Alexanders MD 10/13/2019 2:22 PM  [facility] [address] [place],  [place] [address]  Phone [phone] Fax [phone]

## 2019-10-13 NOTE — Progress Notes (Signed)
Please refer to EMG and nerve conduction procedure note.  

## 2019-11-04 MED FILL — JAKAFI 10 MG TABLET: 10 | 30 days supply | Qty: 60 | Fill #3

## 2019-11-04 NOTE — Telephone Encounter (Signed)
Oral Oncology Patient Advocate Encounter  Received a message from [place] that Shanon Brow was requiring a prior authorization.  I called the insurance to see what the issue was since I received an approval on 10/08/19.  [employer] rep stated that the PA was in for the 20mg  tablet and not the 10mg .    I submitted a new prior auth for Jakafi 10mg  with nurse and it has been approved.  PA case # [id number] Approved from 11/03/19 to 11/02/20  [place] Patient [place] Phone [phone] Fax [phone] 11/04/2019 11:11 AM

## 2019-12-07 MED FILL — JAKAFI 10 MG TABLET: 10 | 30 days supply | Qty: 60 | Fill #4

## 2019-12-21 NOTE — Progress Notes (Signed)
This encounter was created in error - please disregard.  This encounter was created in error - please disregard.

## 2019-12-24 ENCOUNTER — Other Ambulatory Visit: Payer: Self-pay

## 2019-12-24 ENCOUNTER — Inpatient Hospital Stay (HOSPITAL_BASED_OUTPATIENT_CLINIC_OR_DEPARTMENT_OTHER): Payer: Managed Care, Other (non HMO) | Admitting: Family

## 2019-12-24 ENCOUNTER — Ambulatory Visit (HOSPITAL_BASED_OUTPATIENT_CLINIC_OR_DEPARTMENT_OTHER)
Admission: RE | Admit: 2019-12-24 | Discharge: 2019-12-24 | Disposition: A | Discharge status: Home / Self Care | Payer: Managed Care, Other (non HMO) | Source: Ambulatory Visit | Attending: Hematology & Oncology | Admitting: Hematology & Oncology

## 2019-12-24 ENCOUNTER — Telehealth: Payer: Self-pay | Admitting: Hematology & Oncology

## 2019-12-24 ENCOUNTER — Encounter: Payer: Self-pay | Admitting: Family

## 2019-12-24 ENCOUNTER — Inpatient Hospital Stay: Payer: Managed Care, Other (non HMO) | Attending: Hematology & Oncology

## 2019-12-24 VITALS — BP 138/74 | HR 56 | Temp 98.2°F | Resp 18 | Ht 71.0 in | Wt 201.0 lb

## 2019-12-24 DIAGNOSIS — D500 Iron deficiency anemia secondary to blood loss (chronic): Secondary | ICD-10-CM

## 2019-12-24 DIAGNOSIS — D751 Secondary polycythemia: Secondary | ICD-10-CM

## 2019-12-24 LAB — CBC WITH DIFFERENTIAL (CANCER CENTER ONLY)
Abs Immature Granulocytes: 0.47 10*3/uL — ABNORMAL HIGH (ref 0.00–0.07)
Basophils Absolute: 0.2 10*3/uL — ABNORMAL HIGH (ref 0.0–0.1)
Basophils Relative: 2 %
Eosinophils Absolute: 0.3 10*3/uL (ref 0.0–0.5)
Eosinophils Relative: 3 %
HCT: 35.2 % — ABNORMAL LOW (ref 39.0–52.0)
Hemoglobin: 11.9 g/dL — ABNORMAL LOW (ref 13.0–17.0)
Immature Granulocytes: 5 %
Lymphocytes Relative: 13 %
Lymphs Abs: 1.3 10*3/uL (ref 0.7–4.0)
MCH: 29 pg (ref 26.0–34.0)
MCHC: 33.8 g/dL (ref 30.0–36.0)
MCV: 85.9 fL (ref 80.0–100.0)
Monocytes Absolute: 0.9 10*3/uL (ref 0.1–1.0)
Monocytes Relative: 8 %
Neutro Abs: 7.1 10*3/uL (ref 1.7–7.7)
Neutrophils Relative %: 69 %
Platelet Count: 292 10*3/uL (ref 150–400)
RBC: 4.1 MIL/uL — ABNORMAL LOW (ref 4.22–5.81)
RDW: 17.5 % — ABNORMAL HIGH (ref 11.5–15.5)
WBC Count: 10.2 10*3/uL (ref 4.0–10.5)
nRBC: 1.6 % — ABNORMAL HIGH (ref 0.0–0.2)

## 2019-12-24 LAB — CMP (CANCER CENTER ONLY)
ALT: 29 U/L (ref 0–44)
AST: 32 U/L (ref 15–41)
Albumin: 5 g/dL (ref 3.5–5.0)
Alkaline Phosphatase: 42 U/L (ref 38–126)
Anion gap: 7 (ref 5–15)
BUN: 17 mg/dL (ref 8–23)
CO2: 28 mmol/L (ref 22–32)
Calcium: 9.6 mg/dL (ref 8.9–10.3)
Chloride: 106 mmol/L (ref 98–111)
Creatinine: 0.98 mg/dL (ref 0.61–1.24)
GFR, Est AFR Am: >60 mL/min (ref >60)
GFR, Estimated: >60 mL/min (ref >60)
Glucose, Bld: 99 mg/dL (ref 70–99)
Potassium: 3.9 mmol/L (ref 3.5–5.1)
Sodium: 141 mmol/L (ref 135–145)
Total Bilirubin: 1.3 mg/dL — ABNORMAL HIGH (ref 0.3–1.2)
Total Protein: 6.8 g/dL (ref 6.5–8.1)

## 2019-12-24 LAB — SAVE SMEAR(SSMR), FOR PROVIDER SLIDE REVIEW

## 2019-12-24 LAB — LACTATE DEHYDROGENASE: LDH: 663 U/L — ABNORMAL HIGH (ref 98–192)

## 2019-12-24 NOTE — Telephone Encounter (Signed)
Appointments scheduled calendar printed & mailed per 8/19 los 

## 2019-12-24 NOTE — Progress Notes (Signed)
Hematology and Oncology Follow Up Visit  [name] [name] [id number] 1955/11/23 64 y.o. 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 Unfortunately he lost his sweet brother recently. This has been hard for him. He did have a nice trip with his family to the beach which was healing for them.  He has occasional feelings of disorientation and palpitations. He has spoken to cardiology about this and they recently increased his inderal. He is walking daily for exercise.  His WBC count is 10.2, Hgb 11.9, platelets 292.  Today's abdominal US showed stable splenomegaly.  He is doing well on [place] and verbalized that he is taking as prescribed.  No fever, chills, n/v, cough, rash, SOB, chest pain, abdominal pain or changes in bowel or bladder habits.  He has noted occasional bright red blood in his stool which he thinks may be from a hemorrhoid and states that he plans to contact GI and see if they want to do a colonoscopy for further eval.  No other blood loss noted. No abnormal bruising or petechiae noted.  No swelling in his extremities.  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  No falls or syncopal episodes to report.  He has maintained a good appetite and is staying well hydrated. His weight is stable at 201 lbs.  He has mild sleep apnea but states that he is not ready to use a CPAP machine.   ECOG Performance Status: 1 - Symptomatic but completely ambulatory  Medications:  Allergies as of  12/24/2019      Reactions   Zoster Vaccine Recombinant, Adjuvanted Anaphylaxis, Other (See Comments)   Fever.      Medication List       Accurate as of December 24, 2019 10:54 AM. If you have any questions, ask your nurse or doctor.        acetaminophen 500 MG tablet Commonly known as: TYLENOL Take 1,000 mg by mouth every 6 (six) hours as needed for mild pain.   aspirin 81 MG EC tablet Take 1 tablet (81 mg total) by mouth daily.   buPROPion 150 MG 12 hr tablet Commonly known as: WELLBUTRIN SR Take 150 mg by mouth every morning.   Eliquis 5 MG Tabs tablet Generic drug: apixaban TAKE 1 TABLET BY MOUTH TWICE A DAY   gabapentin 300 MG capsule Commonly known as: NEURONTIN Take 2 capsules (600 mg total) by mouth at bedtime.   Jakafi 10 MG tablet Generic drug: ruxolitinib phosphate TAKE 1 TABLET (10 MG TOTAL) BY MOUTH 2 (TWO) TIMES DAILY.   losartan 50 MG tablet Commonly known as: COZAAR Take 50 mg by mouth daily.   Mens Multivitamin Tabs Take 1 tablet by mouth daily.   propranolol ER 160 MG SR capsule Commonly known as: INDERAL LA TAKE 1 CAPSULE (160 MG TOTAL) BY MOUTH DAILY.   rosuvastatin 20 MG tablet Commonly known as: CRESTOR Take 1 tablet (20 mg total) by mouth daily.   zolpidem 12.5 MG CR tablet Commonly  known as: AMBIEN CR Take 6.25 mg by mouth at bedtime.       Allergies:  Allergies  Allergen Reactions  . Zoster Vaccine Recombinant, Adjuvanted Anaphylaxis and Other (See Comments)    Fever.     Past Medical History, Surgical history, Social history, and Family History were reviewed and updated.  Review of Systems: All other 10 point review of systems is negative.   Physical Exam:  height is 5\' 11"  (1.803 m) and weight is 201 lb 0.6 oz (91.2 kg). His oral temperature is 98.2 F (36.8 C). His blood pressure is 138/74 and his pulse is 56 (abnormal). His respiration is 18 and oxygen saturation is 98%.   Wt Readings from Last 3 Encounters:    12/24/19 201 lb 0.6 oz (91.2 kg)  10/01/19 200 lb (90.7 kg)  09/09/19 202 lb 6.4 oz (91.8 kg)    Ocular: Sclerae unicteric, pupils equal, round and reactive to light Ear-nose-throat: Oropharynx clear, dentition fair Lymphatic: No cervical or supraclavicular adenopathy Lungs no rales or rhonchi, good excursion bilaterally Heart regular rate and rhythm, no murmur appreciated Abd soft, nontender, positive bowel sounds, no liver or spleen tip palpated on exam, no fluid wave  MSK no focal spinal tenderness, no joint edema Neuro: non-focal, well-oriented, appropriate affect Breasts: Deferred   Lab Results  Component Value Date   WBC 10.2 12/24/2019   HGB 11.9 (L) 12/24/2019   HCT 35.2 (L) 12/24/2019   MCV 85.9 12/24/2019   PLT 292 12/24/2019   Lab Results  Component Value Date   FERRITIN 111 06/23/2019   IRON 168 (H) 06/23/2019   TIBC 396 06/23/2019   UIBC 228 06/23/2019   IRONPCTSAT 42 06/23/2019   Lab Results  Component Value Date   RETICCTPCT 3.2 (H) 12/04/2018   RBC 4.10 (L) 12/24/2019   RETICCTABS 87.6 02/21/2011   No results found for: KPAFRELGTCHN, LAMBDASER, KAPLAMBRATIO No results found for: IGGSERUM, IGA, IGMSERUM No results found for: Odetta Pink, SPEI   Chemistry      Component Value Date/Time   NA 141 12/24/2019 0944   NA 144 04/25/2017 1506   NA 139 04/12/2016 1113   K 3.9 12/24/2019 0944   K 4.2 04/25/2017 1506   K 4.1 04/12/2016 1113   CL 106 12/24/2019 0944   CL 105 04/25/2017 1506   CO2 28 12/24/2019 0944   CO2 29 04/25/2017 1506   CO2 22 04/12/2016 1113   BUN 17 12/24/2019 0944   BUN 17 04/25/2017 1506   BUN 16.3 04/12/2016 1113   CREATININE 0.98 12/24/2019 0944   CREATININE 1.2 04/25/2017 1506   CREATININE 0.9 04/12/2016 1113      Component Value Date/Time   CALCIUM 9.6 12/24/2019 0944   CALCIUM 9.5 04/25/2017 1506   CALCIUM 9.1 04/12/2016 1113   ALKPHOS 42 12/24/2019 0944    ALKPHOS 73 04/25/2017 1506   ALKPHOS 66 04/12/2016 1113   AST 32 12/24/2019 0944   AST 31 04/12/2016 1113   ALT 29 12/24/2019 0944   ALT 58 (H) 04/25/2017 1506   ALT 36 04/12/2016 1113   BILITOT 1.3 (H) 12/24/2019 0944   BILITOT 0.73 04/12/2016 1113       Impression and Plan: [name] [name] is a very pleasant 64 yo caucasian gentleman with polycythemia vera, JAK2 positive.  He is doing well on Jakifi and will continue his same regimen.  No phlebotomy needed.  We will see what his iron studies show and bring him  back in for infusion if needed.  We will plan to see him again in another 3 months.  [place] of abdomen due every 6 months unless symptomatic.  He can contact our office with any questions or concerns. We can certainly see him sooner if needed.   Laverna Peace, NP 8/19/202110:54 AM

## 2019-12-25 LAB — FERRITIN: Ferritin: 97 ng/mL (ref 24–336)

## 2019-12-25 LAB — IRON AND TIBC
Iron: 144 ug/dL (ref 42–163)
Saturation Ratios: 36 % (ref 20–55)
TIBC: 398 ug/dL (ref 202–409)
UIBC: 253 ug/dL (ref 117–376)

## 2020-01-01 ENCOUNTER — Other Ambulatory Visit: Payer: Managed Care, Other (non HMO)

## 2020-01-01 ENCOUNTER — Ambulatory Visit: Payer: Managed Care, Other (non HMO) | Admitting: Family

## 2020-01-01 ENCOUNTER — Other Ambulatory Visit (HOSPITAL_BASED_OUTPATIENT_CLINIC_OR_DEPARTMENT_OTHER): Payer: Managed Care, Other (non HMO)

## 2020-01-04 ENCOUNTER — Other Ambulatory Visit: Payer: Self-pay | Admitting: Hematology & Oncology

## 2020-01-04 DIAGNOSIS — D751 Secondary polycythemia: Secondary | ICD-10-CM

## 2020-01-05 MED FILL — JAKAFI 10 MG TABLET: 10 | 30 days supply | Qty: 60 | Fill #0

## 2020-01-12 ENCOUNTER — Other Ambulatory Visit: Payer: Self-pay | Admitting: Cardiology

## 2020-02-04 MED FILL — JAKAFI 10 MG TABLET: 10 | 30 days supply | Qty: 60 | Fill #1

## 2020-02-10 ENCOUNTER — Other Ambulatory Visit: Payer: Self-pay | Admitting: Cardiology

## 2020-02-10 ENCOUNTER — Other Ambulatory Visit: Payer: Self-pay

## 2020-02-10 DIAGNOSIS — R002 Palpitations: Secondary | ICD-10-CM

## 2020-02-10 DIAGNOSIS — I10 Essential (primary) hypertension: Secondary | ICD-10-CM

## 2020-02-10 MED ORDER — PROPRANOLOL HCL ER 160 MG PO CP24: 160.0000 mg | ORAL_CAPSULE | Freq: Every day | ORAL | 2 refills | Status: DC

## 2020-02-25 ENCOUNTER — Other Ambulatory Visit: Payer: Self-pay | Admitting: Anatomic Pathology & Clinical Pathology

## 2020-03-04 ENCOUNTER — Other Ambulatory Visit: Payer: Self-pay | Admitting: Cardiology

## 2020-03-04 DIAGNOSIS — E7800 Pure hypercholesterolemia, unspecified: Secondary | ICD-10-CM

## 2020-03-07 MED FILL — JAKAFI 10 MG TABLET: 10 | 30 days supply | Qty: 60 | Fill #2

## 2020-03-10 ENCOUNTER — Telehealth: Payer: Self-pay | Admitting: Cardiology

## 2020-03-10 DIAGNOSIS — E7800 Pure hypercholesterolemia, unspecified: Secondary | ICD-10-CM

## 2020-03-10 MED ORDER — ROSUVASTATIN CALCIUM 20 MG PO TABS: 20.0000 mg | ORAL_TABLET | Freq: Every day | ORAL | 3 refills | Status: DC

## 2020-03-10 NOTE — Telephone Encounter (Signed)
Refill sent to the pharmacy electronically.  

## 2020-03-10 NOTE — Telephone Encounter (Signed)
*  STAT* If patient is at the pharmacy, call can be transferred to refill team.   1. Which medications need to be refilled? (please list name of each medication and dose if known)  rosuvastatin (CRESTOR) 20 MG tablet  2. Which pharmacy/location (including street and city if local pharmacy) is medication to be sent to? [id number] - [place], [place] [address]  3. Do they need a 30 day or 90 day supply? 90 with refills   PT said his medication dosage was changed from 5 mg to 20 mg. [facility]. He needs a new refill of the correct dose

## 2020-03-10 NOTE — Telephone Encounter (Signed)
I do not see where medication was changed from 5mg  to 20mg  please advise.

## 2020-03-19 ENCOUNTER — Emergency Department (HOSPITAL_BASED_OUTPATIENT_CLINIC_OR_DEPARTMENT_OTHER)
Admission: EM | Admit: 2020-03-19 | Discharge: 2020-03-19 | Disposition: A | Discharge status: Home / Self Care | Payer: Managed Care, Other (non HMO) | Attending: Emergency Medicine | Admitting: Emergency Medicine

## 2020-03-19 ENCOUNTER — Encounter (HOSPITAL_BASED_OUTPATIENT_CLINIC_OR_DEPARTMENT_OTHER): Payer: Self-pay | Admitting: Emergency Medicine

## 2020-03-19 DIAGNOSIS — I10 Essential (primary) hypertension: Secondary | ICD-10-CM | POA: Diagnosis not present

## 2020-03-19 DIAGNOSIS — Z7982 Long term (current) use of aspirin: Secondary | ICD-10-CM | POA: Insufficient documentation

## 2020-03-19 DIAGNOSIS — Z79899 Other long term (current) drug therapy: Secondary | ICD-10-CM | POA: Diagnosis not present

## 2020-03-19 DIAGNOSIS — Z7901 Long term (current) use of anticoagulants: Secondary | ICD-10-CM | POA: Diagnosis not present

## 2020-03-19 DIAGNOSIS — B029 Zoster without complications: Secondary | ICD-10-CM | POA: Diagnosis not present

## 2020-03-19 DIAGNOSIS — R109 Unspecified abdominal pain: Secondary | ICD-10-CM | POA: Diagnosis present

## 2020-03-19 LAB — LIPASE, BLOOD: Lipase: 52 U/L — ABNORMAL HIGH (ref 11–51)

## 2020-03-19 LAB — CBC WITH DIFFERENTIAL/PLATELET
Abs Immature Granulocytes: 0.64 10*3/uL — ABNORMAL HIGH (ref 0.00–0.07)
Basophils Absolute: 0.2 10*3/uL — ABNORMAL HIGH (ref 0.0–0.1)
Basophils Relative: 2 %
Eosinophils Absolute: 0.5 10*3/uL (ref 0.0–0.5)
Eosinophils Relative: 4 %
HCT: 39.6 % (ref 39.0–52.0)
Hemoglobin: 13.2 g/dL (ref 13.0–17.0)
Immature Granulocytes: 5 %
Lymphocytes Relative: 12 %
Lymphs Abs: 1.5 10*3/uL (ref 0.7–4.0)
MCH: 29.1 pg (ref 26.0–34.0)
MCHC: 33.3 g/dL (ref 30.0–36.0)
MCV: 87.4 fL (ref 80.0–100.0)
Monocytes Absolute: 1.1 10*3/uL — ABNORMAL HIGH (ref 0.1–1.0)
Monocytes Relative: 9 %
Neutro Abs: 8.3 10*3/uL — ABNORMAL HIGH (ref 1.7–7.7)
Neutrophils Relative %: 68 %
Platelets: 364 10*3/uL (ref 150–400)
RBC: 4.53 MIL/uL (ref 4.22–5.81)
RDW: 18 % — ABNORMAL HIGH (ref 11.5–15.5)
WBC: 12.1 10*3/uL — ABNORMAL HIGH (ref 4.0–10.5)
nRBC: 1.7 % — ABNORMAL HIGH (ref 0.0–0.2)

## 2020-03-19 LAB — URINALYSIS, ROUTINE W REFLEX MICROSCOPIC
Bilirubin Urine: NEGATIVE
Glucose, UA: NEGATIVE mg/dL
Hgb urine dipstick: NEGATIVE
Ketones, ur: NEGATIVE mg/dL
Leukocytes,Ua: NEGATIVE
Nitrite: NEGATIVE
Protein, ur: NEGATIVE mg/dL
Specific Gravity, Urine: 1.01 (ref 1.005–1.030)
pH: 7 (ref 5.0–8.0)

## 2020-03-19 LAB — COMPREHENSIVE METABOLIC PANEL
ALT: 52 U/L — ABNORMAL HIGH (ref 0–44)
AST: 47 U/L — ABNORMAL HIGH (ref 15–41)
Albumin: 5 g/dL (ref 3.5–5.0)
Alkaline Phosphatase: 44 U/L (ref 38–126)
Anion gap: 9 (ref 5–15)
BUN: 15 mg/dL (ref 8–23)
CO2: 29 mmol/L (ref 22–32)
Calcium: 9.5 mg/dL (ref 8.9–10.3)
Chloride: 103 mmol/L (ref 98–111)
Creatinine, Ser: 0.96 mg/dL (ref 0.61–1.24)
GFR, Estimated: >60 mL/min (ref >60)
Glucose, Bld: 97 mg/dL (ref 70–99)
Potassium: 4.2 mmol/L (ref 3.5–5.1)
Sodium: 141 mmol/L (ref 135–145)
Total Bilirubin: 1.3 mg/dL — ABNORMAL HIGH (ref 0.3–1.2)
Total Protein: 7.3 g/dL (ref 6.5–8.1)

## 2020-03-19 MED ORDER — SODIUM CHLORIDE 0.9 % IV BOLUS: 1000.0000 mL | Freq: Once | INTRAVENOUS | Status: DC

## 2020-03-19 MED ORDER — LIDOCAINE 5 % EX PTCH: 1.0000 | MEDICATED_PATCH | CUTANEOUS | 0 refills | Status: DC

## 2020-03-19 MED ORDER — VALACYCLOVIR HCL 1 G PO TABS: 1000.0000 mg | ORAL_TABLET | Freq: Three times a day (TID) | ORAL | 0 refills | Status: AC

## 2020-03-19 MED ORDER — HYDROCODONE-ACETAMINOPHEN 5-325 MG PO TABS
1.0000 | ORAL_TABLET | ORAL | 0 refills | Status: DC | PRN
Start: 2020-03-19 — End: 2020-08-22

## 2020-03-19 NOTE — ED Notes (Signed)
Unable to provide u/a at this time 

## 2020-03-19 NOTE — ED Triage Notes (Addendum)
Generalized abd pain x 2 days. Denies N/V/D. Also reports a rash to the L side of his abd.

## 2020-03-19 NOTE — ED Provider Notes (Signed)
[facility] EMERGENCY DEPARTMENT Provider Note   CSN: [id number] Arrival date & time: 03/19/20  1131     History Chief Complaint  Patient presents with  . Abdominal Pain    [name] [name] is a 64 y.o. male.  Pt presents to the ED today with abdominal pain.  Pt said he also has a rash to his left flank and that is where it hurts.  No n/v.  No f/c.  He had both doses of the shingles vaccine 1 year ago.        Past Medical History:  Diagnosis Date  . Diverticulosis of colon (without mention of hemorrhage)   . Hemorrhoids   . Hypertension   . IBS (irritable bowel syndrome)   . Iron deficiency anemia due to chronic blood loss 09/26/2017  . Irregular heartbeat   . Nephrolithiasis   . Other and unspecified hyperlipidemia   . Perirectal fistula   . Polycythemia, secondary   . Stroke ([place])   . Stye    blocker duct right eye lid    Patient Active Problem List   Diagnosis Date Noted  . Acute CVA (cerebrovascular accident) ([place]) 09/13/2018  . Leukocytosis 09/13/2018  . AF (paroxysmal atrial fibrillation) ([place]) 09/13/2018  . Iron deficiency anemia due to chronic blood loss 09/26/2017  . PALPITATIONS 09/14/2009  . CHEST PAIN 09/09/2009  . Constipation 06/08/2008  . RECTAL BLEEDING 06/08/2008  . Hyperlipidemia 06/03/2008  . POLYCYTHEMIA 06/03/2008  . DIVERTICULOSIS OF COLON 06/03/2008    Past Surgical History:  Procedure Laterality Date  . RETINAL DETACHMENT SURGERY    . ROTATOR CUFF REPAIR     rt.  Marland Kitchen VASECTOMY         Family History  Problem Relation Age of Onset  . Aortic aneurysm Father   . Heart attack Brother   . Cerebral palsy Brother   . Colon cancer Neg Hx   . Esophageal cancer Neg Hx   . Pancreatic cancer Neg Hx   . Prostate cancer Neg Hx   . Rectal cancer Neg Hx   . Stomach cancer Neg Hx     Social History   Tobacco Use  . Smoking status: Never Smoker  . Smokeless tobacco: Never Used  Vaping Use  . Vaping Use: Never used   Substance Use Topics  . Alcohol use: Yes    Alcohol/week: 0.0 standard drinks    Comment: Occasional  . Drug use: No    Home Medications Prior to Admission medications   Medication Sig Start Date End Date Taking? Authorizing Provider  acetaminophen (TYLENOL) 500 MG tablet Take 1,000 mg by mouth every 6 (six) hours as needed for mild pain.     [provider]  aspirin EC 81 MG EC tablet Take 1 tablet (81 mg total) by mouth daily. 09/16/18   Lavina Hamman, MD  buPROPion [facility] SR) 150 MG 12 hr tablet Take 150 mg by mouth every morning. 07/31/15   [provider]  ELIQUIS 5 MG TABS tablet TAKE 1 TABLET BY MOUTH TWICE A DAY 01/13/20   Lelon Perla, MD  gabapentin (NEURONTIN) 300 MG capsule Take 2 capsules (600 mg total) by mouth at bedtime. 09/09/19   [name], NP  HYDROcodone-acetaminophen (NORCO/VICODIN) 5-325 MG tablet Take 1 tablet by mouth every 4 (four) hours as needed. 03/19/20   Isla Pence, MD  JAKAFI 10 MG tablet TAKE 1 TABLET (10 MG TOTAL) BY MOUTH 2 (TWO) TIMES DAILY. 01/04/20   [name], MD  lidocaine (LIDODERM) 5 % Place 1 patch onto the skin daily. Remove & Discard patch within 12 hours or as directed by MD 03/19/20   Isla Pence, MD  losartan (COZAAR) 50 MG tablet TAKE 1 TABLET BY MOUTH EVERY DAY 03/04/20   Lelon Perla, MD  Multiple Vitamins-Minerals (MENS MULTIVITAMIN) TABS Take 1 tablet by mouth daily.    [provider]  propranolol ER (INDERAL LA) 160 MG SR capsule Take 1 capsule (160 mg total) by mouth daily. 02/10/20   Lelon Perla, MD  rosuvastatin (CRESTOR) 20 MG tablet Take 1 tablet (20 mg total) by mouth daily. 03/10/20 06/08/20  Lelon Perla, MD  valACYclovir (VALTREX) 1000 MG tablet Take 1 tablet (1,000 mg total) by mouth 3 (three) times daily for 7 days. 03/19/20 03/26/20  Isla Pence, MD  zolpidem (AMBIEN CR) 12.5 MG CR tablet Take 6.25 mg by mouth at bedtime.     [provider]     Allergies    Zoster vaccine recombinant, adjuvanted  Review of Systems   Review of Systems  Gastrointestinal: Positive for abdominal pain.  Skin: Positive for rash.  All other systems reviewed and are negative.   Physical Exam Updated Vital Signs BP 133/77   Pulse 62   Temp 97.9 F (36.6 C) (Oral)   Resp 18   Ht 5\' 11"  (1.803 m)   Wt 90.7 kg   SpO2 100%   BMI 27.89 kg/m   Physical Exam Vitals and nursing note reviewed.  Constitutional:      Appearance: He is well-developed.  HENT:     Head: Normocephalic and atraumatic.     Mouth/Throat:     Mouth: Mucous membranes are moist.     Pharynx: Oropharynx is clear.  Eyes:     Extraocular Movements: Extraocular movements intact.     Pupils: Pupils are equal, round, and reactive to light.  Cardiovascular:     Rate and Rhythm: Normal rate and regular rhythm.  Pulmonary:     Effort: Pulmonary effort is normal.     Breath sounds: Normal breath sounds.  Abdominal:     General: Abdomen is flat. Bowel sounds are normal.     Palpations: Abdomen is soft.     Tenderness: There is abdominal tenderness in the left upper quadrant.  Skin:    Capillary Refill: Capillary refill takes less than 2 seconds.     Comments: Shingles rash to his left flank  Neurological:     General: No focal deficit present.     Mental Status: He is alert and oriented to person, place, and time.  Psychiatric:        Mood and Affect: Mood normal.        Behavior: Behavior normal.     ED Results / Procedures / Treatments   Labs (all labs ordered are listed, but only abnormal results are displayed) Labs Reviewed  CBC WITH DIFFERENTIAL/PLATELET - Abnormal; Notable for the following components:      Result Value   WBC 12.1 (*)    RDW 18.0 (*)    nRBC 1.7 (*)    Neutro Abs 8.3 (*)    Monocytes Absolute 1.1 (*)    Basophils Absolute 0.2 (*)    Abs Immature Granulocytes 0.64 (*)    All other components within normal limits  COMPREHENSIVE METABOLIC  PANEL - Abnormal; Notable for the following components:   AST 47 (*)    ALT 52 (*)    Total Bilirubin 1.3 (*)  All other components within normal limits  LIPASE, BLOOD - Abnormal; Notable for the following components:   Lipase 52 (*)    All other components within normal limits  URINALYSIS, ROUTINE W REFLEX MICROSCOPIC    EKG None  Radiology No results found.  Procedures Procedures (including critical care time)  Medications Ordered in ED Medications  sodium chloride 0.9 % bolus 1,000 mL (1,000 mLs Intravenous Not Given 03/19/20 1235)    ED Course  I have reviewed the triage vital signs and the nursing notes.  Pertinent labs & imaging results that were available during my care of the patient were reviewed by me and considered in my medical decision making (see chart for details).    MDM Rules/Calculators/A&P                          He appears to have a breakthrough shingles infection.  Work up is unremarkable.  Pain is from his shingles rash.  Pt is stable for d/c.  Return if worse.  Final Clinical Impression(s) / ED Diagnoses Final diagnoses:  Herpes zoster without complication    Rx / DC Orders ED Discharge Orders         Ordered    valACYclovir (VALTREX) 1000 MG tablet  3 times daily        03/19/20 1313    HYDROcodone-acetaminophen (NORCO/VICODIN) 5-325 MG tablet  Every 4 hours PRN        03/19/20 1313    lidocaine (LIDODERM) 5 %  Every 24 hours        03/19/20 1323           Isla Pence, MD 03/19/20 1329

## 2020-03-23 ENCOUNTER — Other Ambulatory Visit: Payer: Self-pay | Admitting: Adult Health

## 2020-03-28 ENCOUNTER — Inpatient Hospital Stay: Payer: Managed Care, Other (non HMO) | Attending: Hematology & Oncology

## 2020-03-28 ENCOUNTER — Inpatient Hospital Stay (HOSPITAL_BASED_OUTPATIENT_CLINIC_OR_DEPARTMENT_OTHER): Payer: Managed Care, Other (non HMO) | Admitting: Hematology & Oncology

## 2020-03-28 ENCOUNTER — Other Ambulatory Visit: Payer: Self-pay

## 2020-03-28 ENCOUNTER — Telehealth: Payer: Self-pay

## 2020-03-28 VITALS — BP 133/81 | HR 58 | Temp 98.0°F | Resp 20 | Wt 200.1 lb

## 2020-03-28 DIAGNOSIS — D500 Iron deficiency anemia secondary to blood loss (chronic): Secondary | ICD-10-CM

## 2020-03-28 DIAGNOSIS — Z7901 Long term (current) use of anticoagulants: Secondary | ICD-10-CM | POA: Insufficient documentation

## 2020-03-28 DIAGNOSIS — Z7982 Long term (current) use of aspirin: Secondary | ICD-10-CM | POA: Diagnosis not present

## 2020-03-28 DIAGNOSIS — D751 Secondary polycythemia: Secondary | ICD-10-CM | POA: Diagnosis not present

## 2020-03-28 DIAGNOSIS — Z79899 Other long term (current) drug therapy: Secondary | ICD-10-CM | POA: Diagnosis not present

## 2020-03-28 DIAGNOSIS — D45 Polycythemia vera: Secondary | ICD-10-CM | POA: Insufficient documentation

## 2020-03-28 LAB — CBC WITH DIFFERENTIAL (CANCER CENTER ONLY)
Abs Immature Granulocytes: 0.55 10*3/uL — ABNORMAL HIGH (ref 0.00–0.07)
Basophils Absolute: 0.2 10*3/uL — ABNORMAL HIGH (ref 0.0–0.1)
Basophils Relative: 2 %
Eosinophils Absolute: 0.4 10*3/uL (ref 0.0–0.5)
Eosinophils Relative: 4 %
HCT: 37.5 % — ABNORMAL LOW (ref 39.0–52.0)
Hemoglobin: 12.5 g/dL — ABNORMAL LOW (ref 13.0–17.0)
Immature Granulocytes: 6 %
Lymphocytes Relative: 16 %
Lymphs Abs: 1.6 10*3/uL (ref 0.7–4.0)
MCH: 29.3 pg (ref 26.0–34.0)
MCHC: 33.3 g/dL (ref 30.0–36.0)
MCV: 87.8 fL (ref 80.0–100.0)
Monocytes Absolute: 0.9 10*3/uL (ref 0.1–1.0)
Monocytes Relative: 9 %
Neutro Abs: 6.5 10*3/uL (ref 1.7–7.7)
Neutrophils Relative %: 63 %
Platelet Count: 320 10*3/uL (ref 150–400)
RBC: 4.27 MIL/uL (ref 4.22–5.81)
RDW: 18.4 % — ABNORMAL HIGH (ref 11.5–15.5)
WBC Count: 10 10*3/uL (ref 4.0–10.5)
nRBC: 1.2 % — ABNORMAL HIGH (ref 0.0–0.2)

## 2020-03-28 LAB — IRON AND TIBC
Iron: 154 ug/dL (ref 42–163)
Saturation Ratios: 37 % (ref 20–55)
TIBC: 411 ug/dL — ABNORMAL HIGH (ref 202–409)
UIBC: 257 ug/dL (ref 117–376)

## 2020-03-28 LAB — CMP (CANCER CENTER ONLY)
ALT: 36 U/L (ref 0–44)
AST: 36 U/L (ref 15–41)
Albumin: 5 g/dL (ref 3.5–5.0)
Alkaline Phosphatase: 41 U/L (ref 38–126)
Anion gap: 7 (ref 5–15)
BUN: 19 mg/dL (ref 8–23)
CO2: 31 mmol/L (ref 22–32)
Calcium: 9.8 mg/dL (ref 8.9–10.3)
Chloride: 103 mmol/L (ref 98–111)
Creatinine: 1.05 mg/dL (ref 0.61–1.24)
GFR, Estimated: >60 mL/min (ref >60)
Glucose, Bld: 84 mg/dL (ref 70–99)
Potassium: 4.3 mmol/L (ref 3.5–5.1)
Sodium: 141 mmol/L (ref 135–145)
Total Bilirubin: 1.2 mg/dL (ref 0.3–1.2)
Total Protein: 7 g/dL (ref 6.5–8.1)

## 2020-03-28 LAB — LACTATE DEHYDROGENASE: LDH: 570 U/L — ABNORMAL HIGH (ref 98–192)

## 2020-03-28 LAB — FERRITIN: Ferritin: 168 ng/mL (ref 24–336)

## 2020-03-28 LAB — SAVE SMEAR(SSMR), FOR PROVIDER SLIDE REVIEW

## 2020-03-28 MED ORDER — VALACYCLOVIR HCL 500 MG PO TABS: 500.0000 mg | ORAL_TABLET | Freq: Every day | ORAL | 12 refills | Status: DC

## 2020-03-28 NOTE — Telephone Encounter (Signed)
appts made and printed for pt per 03/28/20 los-for the ultrasound- this will be scheduled at [facility] as [facility] is not accepting [employer] at this time for imaging.  Pt aware of this and that I will be calling with an appt... AOM

## 2020-03-28 NOTE — Progress Notes (Signed)
Hematology and Oncology Follow Up Visit  [name] [name] [id number] 03/15/1956 64 y.o. 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 Vezina is here today for follow-up.  Overall overall, he is doing okay.  He is getting ready to retire in April.  I am so happy for him.  He is on [place] and doing well with the Jakafi.  He continues on Eliquis and aspirin.  He may have some sleep apnea.  I think he had a sleep study done.  I am unsure when he sees the sleep specialist.  There is been no problems with cough or shortness of breath.  The big issue was that he had shingles.  This was in the left T7 dermatome.  This happened a few weeks ago.  He is going need to be on maintenance low-dose suppressive Valtrex.  He has had no problems with bowels or bladder.  There is been no headache.  He will have a quiet Thanksgiving.  [place]  He has had no nausea or vomiting.  He has had no abdominal pain.  Overall, I would say his performance status is ECOG 1.    Medications:  Allergies as of 03/28/2020      Reactions   Zoster Vaccine Recombinant, Adjuvanted Anaphylaxis, Other (See Comments)   03/28/2020 Patient reports only had fever.      Medication List       Accurate as of March 28, 2020 11:44 AM. If you have any questions, ask your nurse or doctor.        acetaminophen 500 MG tablet Commonly known as: TYLENOL Take 1,000 mg by mouth every 6 (six) hours as needed for mild pain.   aspirin 81 MG EC  tablet Take 1 tablet (81 mg total) by mouth daily.   buPROPion 150 MG 12 hr tablet Commonly known as: WELLBUTRIN SR Take 150 mg by mouth every morning.   Eliquis 5 MG Tabs tablet Generic drug: apixaban TAKE 1 TABLET BY MOUTH TWICE A DAY   gabapentin 300 MG capsule Commonly known as: NEURONTIN TAKE 2 CAPSULES (600 MG TOTAL) BY MOUTH AT BEDTIME.   HYDROcodone-acetaminophen 5-325 MG tablet Commonly known as: NORCO/VICODIN Take 1 tablet by mouth every 4 (four) hours as needed.   Jakafi 10 MG tablet Generic drug: ruxolitinib phosphate TAKE 1 TABLET (10 MG TOTAL) BY MOUTH 2 (TWO) TIMES DAILY.   lidocaine 5 % Commonly known as: Lidoderm Place 1 patch onto the skin daily. Remove & Discard patch within 12 hours or as directed by MD   losartan 50 MG tablet Commonly known as: COZAAR TAKE 1 TABLET BY MOUTH EVERY DAY   Mens Multivitamin Tabs Take 1 tablet by mouth daily.   propranolol ER 160 MG SR capsule Commonly known as: INDERAL LA Take 1 capsule (160 mg total) by mouth daily.   rosuvastatin 20 MG tablet Commonly known as: CRESTOR Take 1 tablet (20 mg total) by mouth daily.   zolpidem 12.5 MG CR tablet Commonly known as: AMBIEN CR Take  6.25 mg by mouth at bedtime.       Allergies:  Allergies  Allergen Reactions  . Zoster Vaccine Recombinant, Adjuvanted Anaphylaxis and Other (See Comments)    03/28/2020 Patient reports only had fever.     Past Medical History, Surgical history, Social history, and Family History were reviewed and updated.  Review of Systems: Review of Systems  Constitutional: Negative.   HENT: Negative.   Eyes: Negative.   Respiratory: Negative.   Cardiovascular: Negative.   Gastrointestinal: Negative.   Genitourinary: Negative.   Musculoskeletal: Negative.   Skin: Positive for rash.  Neurological: Negative.   Endo/Heme/Allergies: Negative.   Psychiatric/Behavioral: Negative.      Physical Exam:  weight is 200 lb 1.9 oz (90.8 kg).  His oral temperature is 98 F (36.7 C). His blood pressure is 133/81 and his pulse is 58 (abnormal). His respiration is 20 and oxygen saturation is 100%.   Wt Readings from Last 3 Encounters:  03/28/20 200 lb 1.9 oz (90.8 kg)  03/19/20 200 lb (90.7 kg)  12/24/19 201 lb 0.6 oz (91.2 kg)    Physical Exam Vitals reviewed.  HENT:     Head: Normocephalic and atraumatic.  Eyes:     Pupils: Pupils are equal, round, and reactive to light.  Cardiovascular:     Rate and Rhythm: Normal rate and regular rhythm.     Heart sounds: Normal heart sounds.  Pulmonary:     Effort: Pulmonary effort is normal.     Breath sounds: Normal breath sounds.  Abdominal:     General: Bowel sounds are normal.     Palpations: Abdomen is soft.     Comments: I cannot palpate his spleen.  Musculoskeletal:        General: No tenderness or deformity. Normal range of motion.     Cervical back: Normal range of motion.  Lymphadenopathy:     Cervical: No cervical adenopathy.  Skin:    General: Skin is warm and dry.     Findings: No erythema or rash.     Comments: He has the healing zoster rash in the left T7 dermatome.  There is some sensitivity to touch in this area.  Neurological:     Mental Status: He is alert and oriented to person, place, and time.  Psychiatric:        Behavior: Behavior normal.        Thought Content: Thought content normal.        Judgment: Judgment normal.      Lab Results  Component Value Date   WBC 10.0 03/28/2020   HGB 12.5 (L) 03/28/2020   HCT 37.5 (L) 03/28/2020   MCV 87.8 03/28/2020   PLT 320 03/28/2020   Lab Results  Component Value Date   FERRITIN 97 12/24/2019   IRON 144 12/24/2019   TIBC 398 12/24/2019   UIBC 253 12/24/2019   IRONPCTSAT 36 12/24/2019   Lab Results  Component Value Date   RETICCTPCT 3.2 (H) 12/04/2018   RBC 4.27 03/28/2020   RETICCTABS 87.6 02/21/2011   No results found for: KPAFRELGTCHN, LAMBDASER, KAPLAMBRATIO No results found for:  IGGSERUM, IGA, IGMSERUM No results found for: Ronnald Ramp, A1GS, A2GS, Tillman Sers, SPEI   Chemistry      Component Value Date/Time   NA 141 03/28/2020 1012   NA 144 04/25/2017 1506   NA 139 04/12/2016 1113   K 4.3 03/28/2020 1012   K 4.2 04/25/2017 1506   K 4.1 04/12/2016 1113  CL 103 03/28/2020 1012   CL 105 04/25/2017 1506   CO2 31 03/28/2020 1012   CO2 29 04/25/2017 1506   CO2 22 04/12/2016 1113   BUN 19 03/28/2020 1012   BUN 17 04/25/2017 1506   BUN 16.3 04/12/2016 1113   CREATININE 1.05 03/28/2020 1012   CREATININE 1.2 04/25/2017 1506   CREATININE 0.9 04/12/2016 1113      Component Value Date/Time   CALCIUM 9.8 03/28/2020 1012   CALCIUM 9.5 04/25/2017 1506   CALCIUM 9.1 04/12/2016 1113   ALKPHOS 41 03/28/2020 1012   ALKPHOS 73 04/25/2017 1506   ALKPHOS 66 04/12/2016 1113   AST 36 03/28/2020 1012   AST 31 04/12/2016 1113   ALT 36 03/28/2020 1012   ALT 58 (H) 04/25/2017 1506   ALT 36 04/12/2016 1113   BILITOT 1.2 03/28/2020 1012   BILITOT 0.73 04/12/2016 1113       Impression and Plan: Mr. Meadowcroft is a very pleasant 64 yo caucasian gentleman with polycythemia vera, JAK2 positive.   I think the Shanon Brow is doing quite well for him.  I am very happy that he has done well with the [facility].  Again I really cannot palpate his spleen.  I would like to get an ultrasound of his spleen when we see him back.  I think this will be quite helpful.  Again, he will need to be on suppressive therapy for the shingles.  We will put him on Valtrex 500 mg p.o. daily.  I would like to see him back before the end of the year.  If all looks good at that point, then we will get him back in the springtime.   [name], MD 11/22/202111:44 AM

## 2020-04-05 MED FILL — JAKAFI 10 MG TABLET: 10 | 30 days supply | Qty: 60 | Fill #3

## 2020-04-20 IMAGING — CT CT HEAD WITHOUT CONTRAST
3 series · 15 of 47 positions shown, 18 images · non-contrast
Comparison: CT brain, 09/12/2018, MR brain, 09/13/2018

CLINICAL DATA: Follow-up stroke

EXAM:
CT HEAD WITHOUT CONTRAST
TECHNIQUE: Contiguous axial images were obtained from the base of the skull
through the vertex without intravenous contrast.

[Series 3: head 5.0 h30s · axial · 0.44mm/px · z∈[-58,+67]mm · 9 of 31 slices shown, 12 images]
[im 3/31  brain]
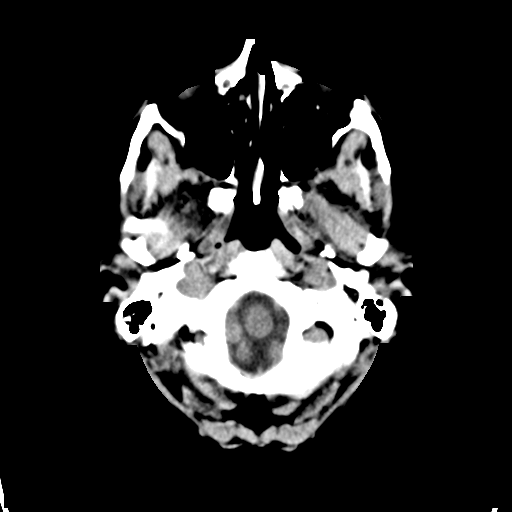
[im 3/31  bone]
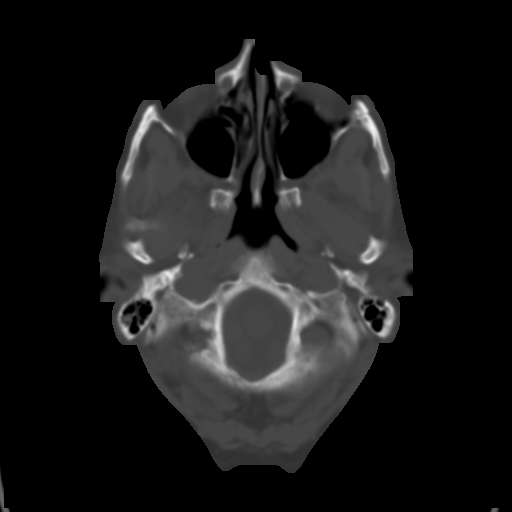
[im 6/31  brain]
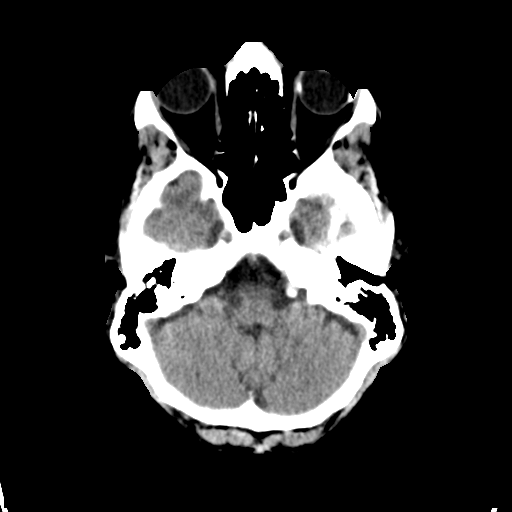
[im 9/31  brain]
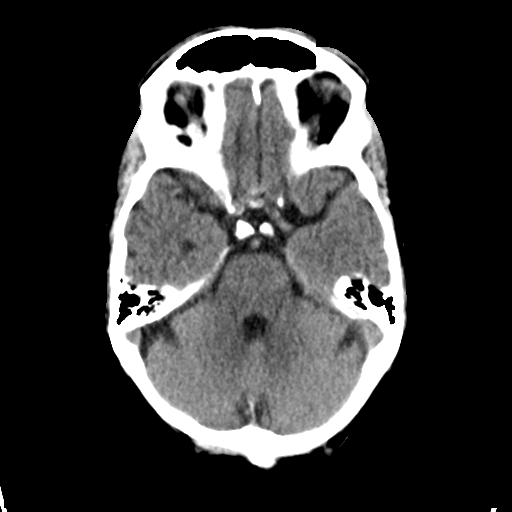
[im 12/31  brain]
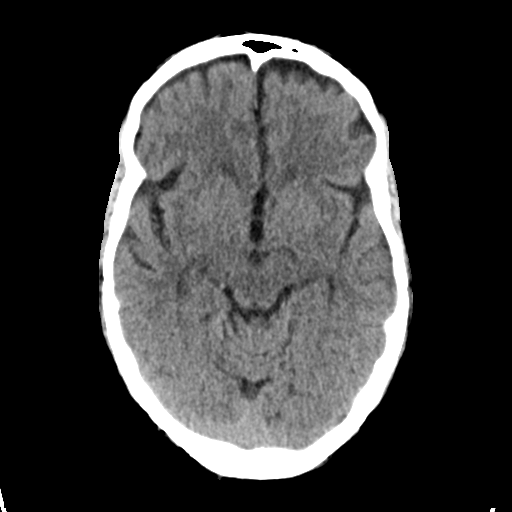
[im 16/31  brain]
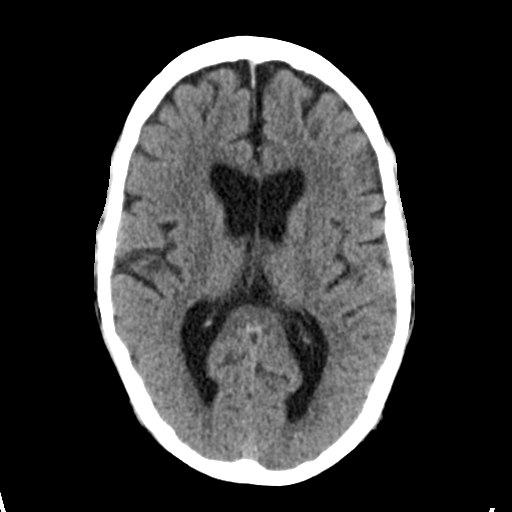
[im 16/31  bone]
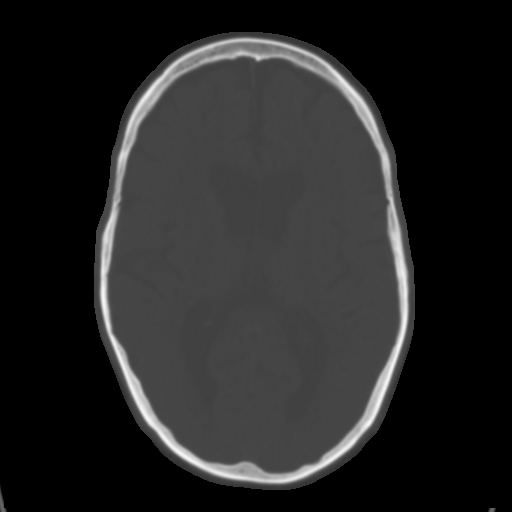
[im 19/31  brain]
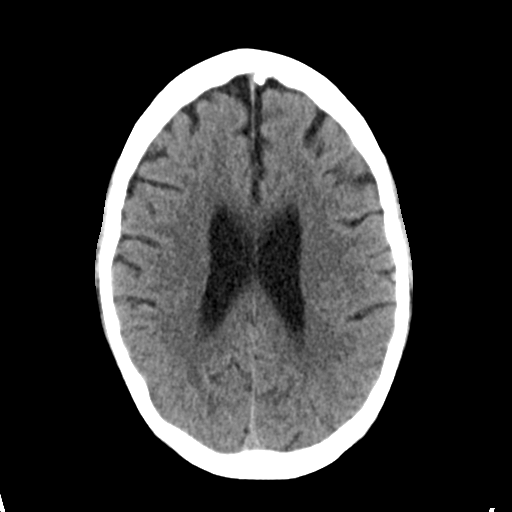
[im 22/31  brain]
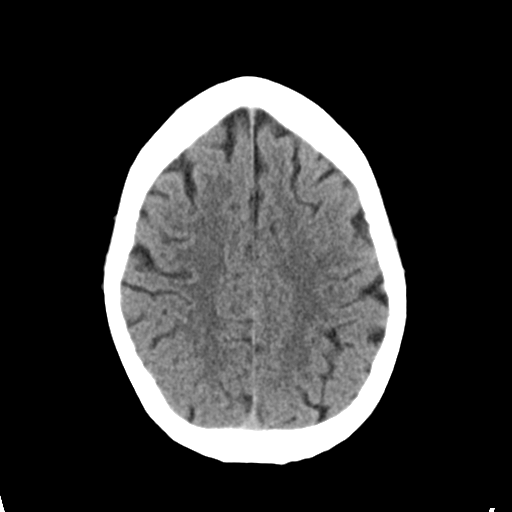
[im 25/31  brain]
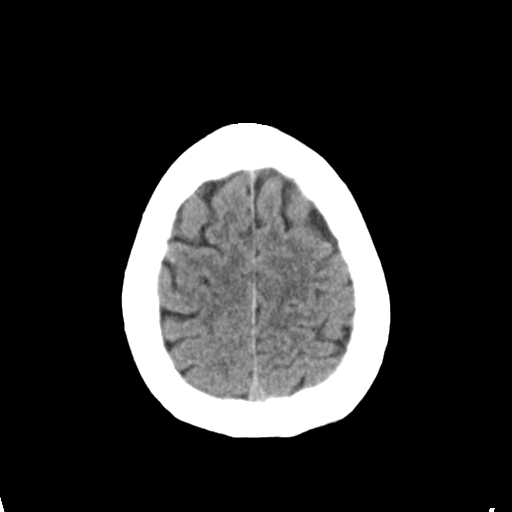
[im 28/31  brain]
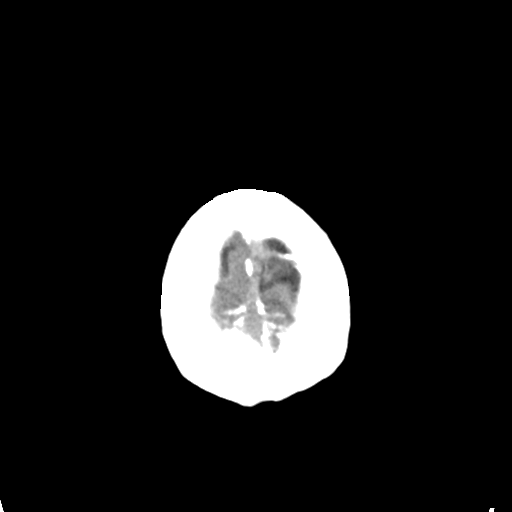
[im 28/31  bone]
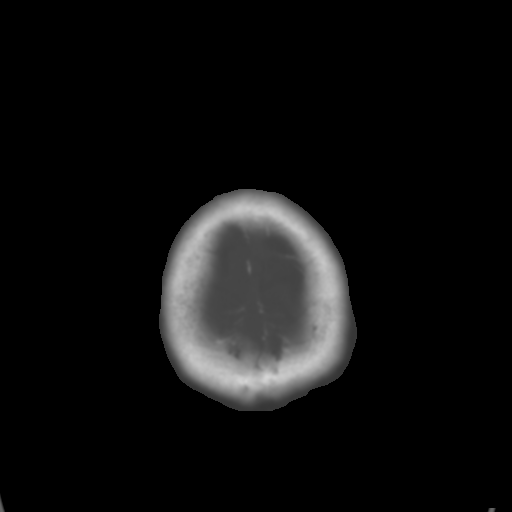

[Series 5: head 3.0 mpr cor · coronal · 0.30mm/px · 3 of 70 slices shown]
[im 24/70  brain]
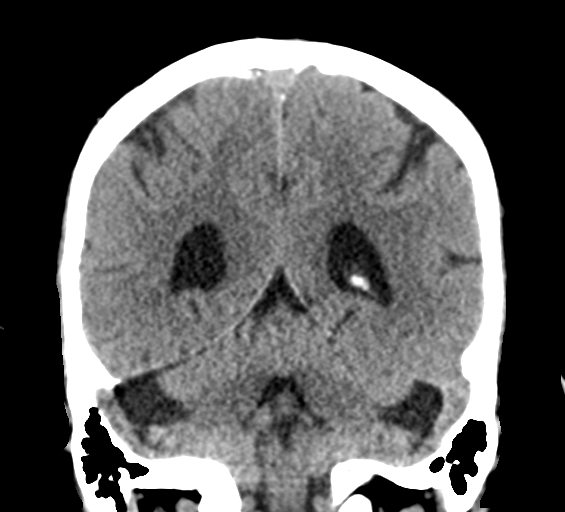
[im 31/70  brain]
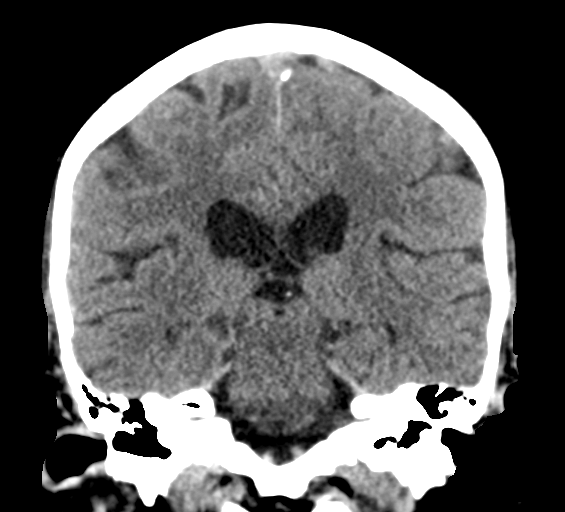
[im 39/70  brain]
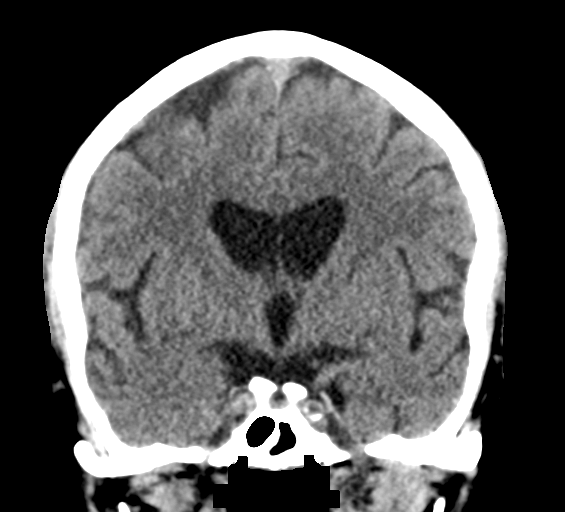

[Series 6: head 3.0 mpr sag · sagittal · 0.30mm/px · 3 of 67 slices shown]
[im 23/67  brain]
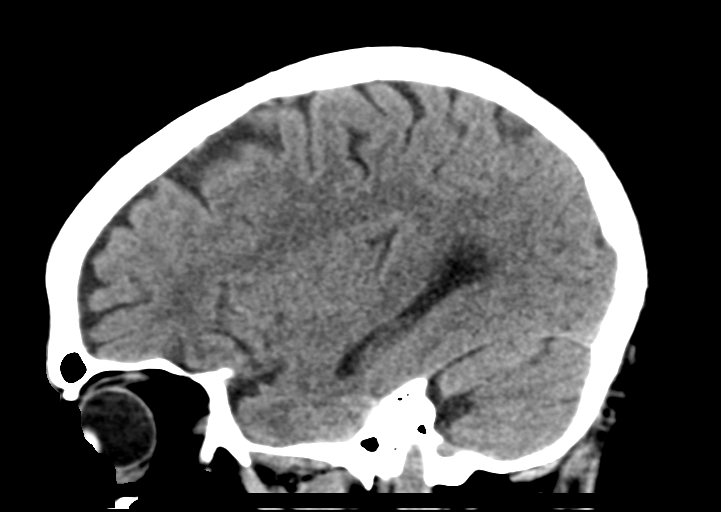
[im 34/67  brain]
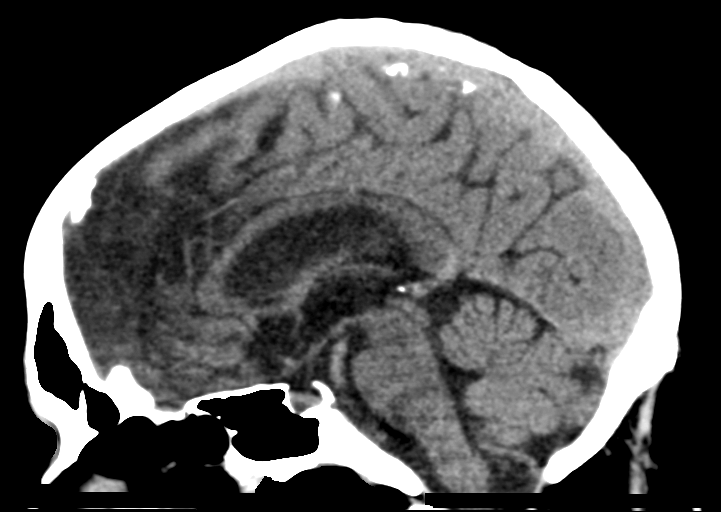
[im 45/67  brain]
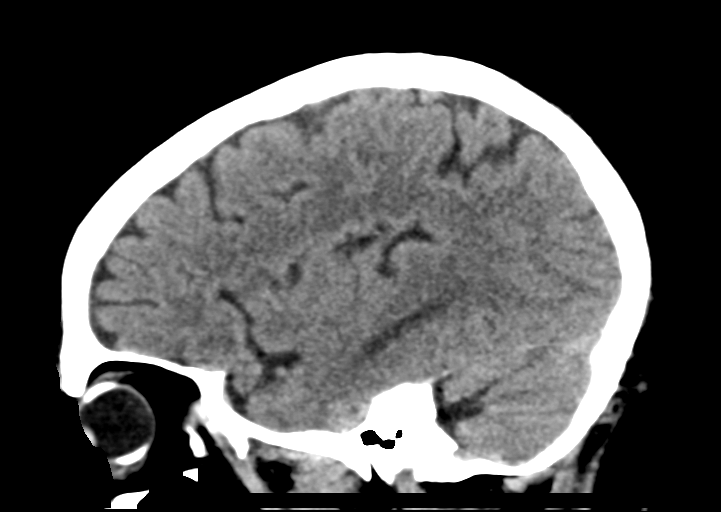

[15 of 47 positions shown; findings below may reference images not displayed]

FINDINGS: Brain: No evidence of acute infarction, hemorrhage, hydrocephalus,
extra-axial collection or mass lesion/mass effect.

Vascular: No hyperdense vessel or unexpected calcification.

Skull: Normal. Negative for fracture or focal lesion.

Sinuses/Orbits: No acute finding.

Other: None.
IMPRESSION: No acute intracranial pathology by CT. No evidence of stroke or
hemorrhage.

## 2020-05-02 MED FILL — JAKAFI 10 MG TABLET: 10 | 30 days supply | Qty: 60 | Fill #4

## 2020-05-04 ENCOUNTER — Telehealth: Payer: Self-pay | Admitting: Hematology & Oncology

## 2020-05-04 ENCOUNTER — Inpatient Hospital Stay (HOSPITAL_BASED_OUTPATIENT_CLINIC_OR_DEPARTMENT_OTHER): Payer: Managed Care, Other (non HMO) | Admitting: Hematology & Oncology

## 2020-05-04 ENCOUNTER — Encounter: Payer: Self-pay | Admitting: Hematology & Oncology

## 2020-05-04 ENCOUNTER — Other Ambulatory Visit: Payer: Self-pay

## 2020-05-04 ENCOUNTER — Inpatient Hospital Stay: Payer: Managed Care, Other (non HMO) | Attending: Hematology & Oncology

## 2020-05-04 ENCOUNTER — Ambulatory Visit (HOSPITAL_BASED_OUTPATIENT_CLINIC_OR_DEPARTMENT_OTHER)
Admission: RE | Admit: 2020-05-04 | Discharge: 2020-05-04 | Disposition: A | Discharge status: Home / Self Care | Payer: Managed Care, Other (non HMO) | Source: Ambulatory Visit | Attending: Hematology & Oncology | Admitting: Hematology & Oncology

## 2020-05-04 VITALS — BP 134/63 | HR 61 | Temp 98.0°F | Resp 20 | Wt 207.8 lb

## 2020-05-04 DIAGNOSIS — E611 Iron deficiency: Secondary | ICD-10-CM | POA: Insufficient documentation

## 2020-05-04 DIAGNOSIS — Z7901 Long term (current) use of anticoagulants: Secondary | ICD-10-CM | POA: Diagnosis not present

## 2020-05-04 DIAGNOSIS — D751 Secondary polycythemia: Secondary | ICD-10-CM | POA: Insufficient documentation

## 2020-05-04 DIAGNOSIS — D45 Polycythemia vera: Secondary | ICD-10-CM | POA: Diagnosis present

## 2020-05-04 DIAGNOSIS — D500 Iron deficiency anemia secondary to blood loss (chronic): Secondary | ICD-10-CM | POA: Diagnosis not present

## 2020-05-04 LAB — CMP (CANCER CENTER ONLY)
ALT: 34 U/L (ref 0–44)
AST: 33 U/L (ref 15–41)
Albumin: 4.7 g/dL (ref 3.5–5.0)
Alkaline Phosphatase: 47 U/L (ref 38–126)
Anion gap: 7 (ref 5–15)
BUN: 16 mg/dL (ref 8–23)
CO2: 28 mmol/L (ref 22–32)
Calcium: 9.3 mg/dL (ref 8.9–10.3)
Chloride: 105 mmol/L (ref 98–111)
Creatinine: 1.11 mg/dL (ref 0.61–1.24)
GFR, Estimated: >60 mL/min (ref >60)
Glucose, Bld: 95 mg/dL (ref 70–99)
Potassium: 4.2 mmol/L (ref 3.5–5.1)
Sodium: 140 mmol/L (ref 135–145)
Total Bilirubin: 1.2 mg/dL (ref 0.3–1.2)
Total Protein: 6.5 g/dL (ref 6.5–8.1)

## 2020-05-04 LAB — CBC WITH DIFFERENTIAL (CANCER CENTER ONLY)
Abs Immature Granulocytes: 0.71 10*3/uL — ABNORMAL HIGH (ref 0.00–0.07)
Basophils Absolute: 0.3 10*3/uL — ABNORMAL HIGH (ref 0.0–0.1)
Basophils Relative: 2 %
Eosinophils Absolute: 0.5 10*3/uL (ref 0.0–0.5)
Eosinophils Relative: 4 %
HCT: 36.5 % — ABNORMAL LOW (ref 39.0–52.0)
Hemoglobin: 12.1 g/dL — ABNORMAL LOW (ref 13.0–17.0)
Immature Granulocytes: 6 %
Lymphocytes Relative: 15 %
Lymphs Abs: 1.8 10*3/uL (ref 0.7–4.0)
MCH: 29.4 pg (ref 26.0–34.0)
MCHC: 33.2 g/dL (ref 30.0–36.0)
MCV: 88.8 fL (ref 80.0–100.0)
Monocytes Absolute: 1.2 10*3/uL — ABNORMAL HIGH (ref 0.1–1.0)
Monocytes Relative: 10 %
Neutro Abs: 7.7 10*3/uL (ref 1.7–7.7)
Neutrophils Relative %: 63 %
Platelet Count: 306 10*3/uL (ref 150–400)
RBC: 4.11 MIL/uL — ABNORMAL LOW (ref 4.22–5.81)
RDW: 18.6 % — ABNORMAL HIGH (ref 11.5–15.5)
WBC Count: 12.2 10*3/uL — ABNORMAL HIGH (ref 4.0–10.5)
nRBC: 1.2 % — ABNORMAL HIGH (ref 0.0–0.2)

## 2020-05-04 LAB — IRON AND TIBC
Iron: 143 ug/dL (ref 42–163)
Saturation Ratios: 36 % (ref 20–55)
TIBC: 393 ug/dL (ref 202–409)
UIBC: 250 ug/dL (ref 117–376)

## 2020-05-04 LAB — FERRITIN: Ferritin: 112 ng/mL (ref 24–336)

## 2020-05-04 LAB — SAVE SMEAR(SSMR), FOR PROVIDER SLIDE REVIEW

## 2020-05-04 LAB — LACTATE DEHYDROGENASE: LDH: 531 U/L — ABNORMAL HIGH (ref 98–192)

## 2020-05-04 NOTE — Progress Notes (Signed)
Hematology and Oncology Follow Up Visit  [name] [name] [id number] 02-17-1956 64 y.o. 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  He looks quite good.  He has 92 days until he retires.  He is counting down the days.  He had a wonderful Thanksgiving and Christmas.  A daughter and husband are here from [place] right now.  He feels well.  He is really done nicely with the [place].  We did do a ultrasound of the abdomen today.  The ultrasound showed that the spleen is still shrinking.  His splenic size is 780 cm.  We last checked 4 months ago it was about 1150 cm.  He has had no problems with nausea or vomiting.  He has had no change in bowel or bladder habits.  He has had no cerebrovascular issues.  There is been no leg swelling.  He has had no rashes.  Overall, his performance status is ECOG 0.    Medications:  Allergies as of 05/04/2020      Reactions   Zoster Vaccine Recombinant, Adjuvanted Anaphylaxis, Other (See Comments)   03/28/2020 Patient reports only had fever.      Medication List       Accurate as of May 04, 2020 10:03 AM. If you have any questions, ask your nurse or doctor.        STOP taking these medications   lidocaine 5 % Commonly known as: Lidoderm Stopped by: [name], MD     TAKE these medications   acetaminophen 500 MG tablet Commonly known as: TYLENOL Take 1,000 mg by mouth every 6 (six) hours as needed for mild pain.   aspirin 81 MG EC tablet Take 1 tablet (81  mg total) by mouth daily.   buPROPion 150 MG 12 hr tablet Commonly known as: WELLBUTRIN SR Take 150 mg by mouth every morning.   Eliquis 5 MG Tabs tablet Generic drug: apixaban TAKE 1 TABLET BY MOUTH TWICE A DAY   gabapentin 300 MG capsule Commonly known as: NEURONTIN TAKE 2 CAPSULES (600 MG TOTAL) BY MOUTH AT BEDTIME.   HYDROcodone-acetaminophen 5-325 MG tablet Commonly known as: NORCO/VICODIN Take 1 tablet by mouth every 4 (four) hours as needed.   Jakafi 10 MG tablet Generic drug: ruxolitinib phosphate TAKE 1 TABLET (10 MG TOTAL) BY MOUTH 2 (TWO) TIMES DAILY.   losartan 50 MG tablet Commonly known as: COZAAR TAKE 1 TABLET BY MOUTH EVERY DAY   Mens Multivitamin Tabs Take 1 tablet by mouth daily.   propranolol ER 160 MG SR capsule Commonly known as: INDERAL LA Take 1 capsule (160 mg total) by mouth daily.   rosuvastatin 20 MG tablet Commonly known as: CRESTOR Take 1 tablet (20 mg total) by mouth daily.   valACYclovir 500 MG tablet Commonly known as: VALTREX Take 1 tablet (500 mg total) by mouth daily.   zolpidem 12.5 MG CR tablet Commonly known as: AMBIEN CR Take 6.25 mg by mouth at bedtime.       Allergies:  Allergies  Allergen Reactions  . Zoster Vaccine Recombinant, Adjuvanted Anaphylaxis and Other (See Comments)    03/28/2020 Patient reports only had fever.     Past Medical History, Surgical history, Social history, and Family History were reviewed and updated.  Review of Systems: Review of Systems  Constitutional: Negative.   HENT: Negative.   Eyes: Negative.   Respiratory: Negative.   Cardiovascular: Negative.   Gastrointestinal: Negative.   Genitourinary: Negative.   Musculoskeletal: Negative.   Skin: Positive for rash.  Neurological: Negative.   Endo/Heme/Allergies: Negative.   Psychiatric/Behavioral: Negative.      Physical Exam:  weight is 207 lb 12.8 oz (94.3 kg). His oral temperature is 98 F (36.7 C). His blood pressure is  134/63 and his pulse is 61. His respiration is 20 and oxygen saturation is 98%.   Wt Readings from Last 3 Encounters:  05/04/20 207 lb 12.8 oz (94.3 kg)  03/28/20 200 lb 1.9 oz (90.8 kg)  03/19/20 200 lb (90.7 kg)    Physical Exam Vitals reviewed.  HENT:     Head: Normocephalic and atraumatic.  Eyes:     Pupils: Pupils are equal, round, and reactive to light.  Cardiovascular:     Rate and Rhythm: Normal rate and regular rhythm.     Heart sounds: Normal heart sounds.  Pulmonary:     Effort: Pulmonary effort is normal.     Breath sounds: Normal breath sounds.  Abdominal:     General: Bowel sounds are normal.     Palpations: Abdomen is soft.     Comments: I cannot palpate his spleen.  Musculoskeletal:        General: No tenderness or deformity. Normal range of motion.     Cervical back: Normal range of motion.  Lymphadenopathy:     Cervical: No cervical adenopathy.  Skin:    General: Skin is warm and dry.     Findings: No erythema or rash.     Comments: He has the healing zoster rash in the left T7 dermatome.  There is some sensitivity to touch in this area.  Neurological:     Mental Status: He is alert and oriented to person, place, and time.  Psychiatric:        Behavior: Behavior normal.        Thought Content: Thought content normal.        Judgment: Judgment normal.      Lab Results  Component Value Date   WBC 12.2 (H) 05/04/2020   HGB 12.1 (L) 05/04/2020   HCT 36.5 (L) 05/04/2020   MCV 88.8 05/04/2020   PLT 306 05/04/2020   Lab Results  Component Value Date   FERRITIN 168 03/28/2020   IRON 154 03/28/2020   TIBC 411 (H) 03/28/2020   UIBC 257 03/28/2020   IRONPCTSAT 37 03/28/2020   Lab Results  Component Value Date   RETICCTPCT 3.2 (H) 12/04/2018   RBC 4.11 (L) 05/04/2020   RETICCTABS 87.6 02/21/2011   No results found for: KPAFRELGTCHN, LAMBDASER, KAPLAMBRATIO No results found for: IGGSERUM, IGA, IGMSERUM No results found for: Georgann Housekeeper, MSPIKE, SPEI   Chemistry      Component Value Date/Time   NA 140 05/04/2020 0849   NA 144 04/25/2017 1506   NA 139 04/12/2016 1113   K 4.2 05/04/2020 0849   K 4.2 04/25/2017 1506   K 4.1 04/12/2016 1113   CL 105 05/04/2020 0849   CL 105 04/25/2017 1506   CO2 28  05/04/2020 0849   CO2 29 04/25/2017 1506   CO2 22 04/12/2016 1113   BUN 16 05/04/2020 0849   BUN 17 04/25/2017 1506   BUN 16.3 04/12/2016 1113   CREATININE 1.11 05/04/2020 0849   CREATININE 1.2 04/25/2017 1506   CREATININE 0.9 04/12/2016 1113      Component Value Date/Time   CALCIUM 9.3 05/04/2020 0849   CALCIUM 9.5 04/25/2017 1506   CALCIUM 9.1 04/12/2016 1113   ALKPHOS 47 05/04/2020 0849   ALKPHOS 73 04/25/2017 1506   ALKPHOS 66 04/12/2016 1113   AST 33 05/04/2020 0849   AST 31 04/12/2016 1113   ALT 34 05/04/2020 0849   ALT 58 (H) 04/25/2017 1506   ALT 36 04/12/2016 1113   BILITOT 1.2 05/04/2020 0849   BILITOT 0.73 04/12/2016 1113       Impression and Plan: Mr. Kendall is a very pleasant 64 yo caucasian gentleman with polycythemia vera, JAK2 positive.   I think the Shanon Brow is doing quite well for him.  I am very happy that he has done well with the [facility].  Again I really cannot palpate his spleen.  I looked at his blood smear under the microscope.  I do not see anything that looked suspicious for any activity with respect to the polycythemia/myelofibrosis.  I think we can now move his appointments out a little bit longer.  I think we get him back in 2 months.  I am very happy about this.  I hope that he will be able to do some traveling when he retires.     [name], MD 12/29/202110:03 AM

## 2020-05-04 NOTE — Telephone Encounter (Signed)
Appointments scheduled and patient has My chart Access for updates per his request 12/29 los

## 2020-05-23 ENCOUNTER — Ambulatory Visit: Payer: Managed Care, Other (non HMO) | Admitting: Adult Health

## 2020-05-30 ENCOUNTER — Other Ambulatory Visit: Payer: Self-pay | Admitting: Hematology & Oncology

## 2020-05-30 DIAGNOSIS — D751 Secondary polycythemia: Secondary | ICD-10-CM

## 2020-06-02 MED FILL — JAKAFI 10 MG TABLET: 10 | 30 days supply | Qty: 60 | Fill #0

## 2020-06-08 ENCOUNTER — Other Ambulatory Visit: Payer: Self-pay | Admitting: Cardiology

## 2020-07-04 ENCOUNTER — Encounter: Payer: Self-pay | Admitting: Hematology & Oncology

## 2020-07-04 ENCOUNTER — Inpatient Hospital Stay: Payer: Managed Care, Other (non HMO) | Admitting: Hematology & Oncology

## 2020-07-04 ENCOUNTER — Other Ambulatory Visit: Payer: Self-pay

## 2020-07-04 ENCOUNTER — Inpatient Hospital Stay: Payer: Managed Care, Other (non HMO) | Attending: Hematology & Oncology

## 2020-07-04 VITALS — BP 133/79 | HR 60 | Temp 98.2°F | Resp 20 | Wt 209.0 lb

## 2020-07-04 DIAGNOSIS — Z7901 Long term (current) use of anticoagulants: Secondary | ICD-10-CM | POA: Diagnosis not present

## 2020-07-04 DIAGNOSIS — E611 Iron deficiency: Secondary | ICD-10-CM | POA: Diagnosis not present

## 2020-07-04 DIAGNOSIS — Z7982 Long term (current) use of aspirin: Secondary | ICD-10-CM | POA: Insufficient documentation

## 2020-07-04 DIAGNOSIS — D751 Secondary polycythemia: Secondary | ICD-10-CM

## 2020-07-04 DIAGNOSIS — Z79899 Other long term (current) drug therapy: Secondary | ICD-10-CM | POA: Diagnosis not present

## 2020-07-04 DIAGNOSIS — D45 Polycythemia vera: Secondary | ICD-10-CM | POA: Diagnosis present

## 2020-07-04 DIAGNOSIS — D500 Iron deficiency anemia secondary to blood loss (chronic): Secondary | ICD-10-CM

## 2020-07-04 LAB — CMP (CANCER CENTER ONLY)
ALT: 39 U/L (ref 0–44)
AST: 37 U/L (ref 15–41)
Albumin: 5.2 g/dL — ABNORMAL HIGH (ref 3.5–5.0)
Alkaline Phosphatase: 51 U/L (ref 38–126)
Anion gap: 8 (ref 5–15)
BUN: 20 mg/dL (ref 8–23)
CO2: 30 mmol/L (ref 22–32)
Calcium: 10.2 mg/dL (ref 8.9–10.3)
Chloride: 103 mmol/L (ref 98–111)
Creatinine: 1.06 mg/dL (ref 0.61–1.24)
GFR, Estimated: >60 mL/min (ref >60)
Glucose, Bld: 86 mg/dL (ref 70–99)
Potassium: 4.4 mmol/L (ref 3.5–5.1)
Sodium: 141 mmol/L (ref 135–145)
Total Bilirubin: 1.3 mg/dL — ABNORMAL HIGH (ref 0.3–1.2)
Total Protein: 6.9 g/dL (ref 6.5–8.1)

## 2020-07-04 LAB — CBC WITH DIFFERENTIAL (CANCER CENTER ONLY)
Abs Immature Granulocytes: 0.51 10*3/uL — ABNORMAL HIGH (ref 0.00–0.07)
Basophils Absolute: 0.2 10*3/uL — ABNORMAL HIGH (ref 0.0–0.1)
Basophils Relative: 2 %
Eosinophils Absolute: 0.4 10*3/uL (ref 0.0–0.5)
Eosinophils Relative: 3 %
HCT: 38.8 % — ABNORMAL LOW (ref 39.0–52.0)
Hemoglobin: 13 g/dL (ref 13.0–17.0)
Immature Granulocytes: 5 %
Lymphocytes Relative: 14 %
Lymphs Abs: 1.6 10*3/uL (ref 0.7–4.0)
MCH: 30.1 pg (ref 26.0–34.0)
MCHC: 33.5 g/dL (ref 30.0–36.0)
MCV: 89.8 fL (ref 80.0–100.0)
Monocytes Absolute: 1.1 10*3/uL — ABNORMAL HIGH (ref 0.1–1.0)
Monocytes Relative: 10 %
Neutro Abs: 7.4 10*3/uL (ref 1.7–7.7)
Neutrophils Relative %: 66 %
Platelet Count: 342 10*3/uL (ref 150–400)
RBC: 4.32 MIL/uL (ref 4.22–5.81)
RDW: 17.4 % — ABNORMAL HIGH (ref 11.5–15.5)
WBC Count: 11.2 10*3/uL — ABNORMAL HIGH (ref 4.0–10.5)
nRBC: 1.7 % — ABNORMAL HIGH (ref 0.0–0.2)

## 2020-07-04 LAB — LACTATE DEHYDROGENASE: LDH: 581 U/L — ABNORMAL HIGH (ref 98–192)

## 2020-07-04 LAB — SAVE SMEAR(SSMR), FOR PROVIDER SLIDE REVIEW

## 2020-07-04 MED FILL — JAKAFI 10 MG TABLET: 10 | 30 days supply | Qty: 60 | Fill #1

## 2020-07-04 NOTE — Progress Notes (Signed)
Hematology and Oncology Follow Up Visit  [name] [name] [id number] 31-Mar-1956 65 y.o. 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  He now is 31 days until retirement.  He is actually counting down the days.  He is looking forward to his retirement.  He is doing quite well.  He is on Jakafi and having no problems.  He said that 2 weeks ago, he had a feeling of being flushed.  He had no sweating with it.  He had a little bit of dizziness.  There is no vertigo.  He had no bowel or bladder incontinence.  It lasted a few minutes.  Not sure exactly what this would have been.  He has had no bleeding.  He has had no problems with cough or shortness of breath.  He has had no issues with the coronavirus.  There has been no problems with Eliquis or aspirin.  We last saw him, his iron studies showed a ferritin of 112 with an iron saturation of 36%.  He has had no problems with leg swelling.  He says the left leg below his knee on occasion gets a little bit tingly.  Currently, I would say his performance status is ECOG 1.   Medications:  Allergies as of 07/04/2020      Reactions   Zoster Vaccine Recombinant, Adjuvanted Anaphylaxis, Other (See Comments)   03/28/2020 Patient reports only had fever.      Medication List       Accurate as of July 04, 2020  3:28 PM. If you have any questions, ask your nurse or doctor.        acetaminophen 500 MG tablet Commonly known as: TYLENOL Take 1,000 mg by mouth every 6 (six) hours  as needed for mild pain.   aspirin 81 MG EC tablet Take 1 tablet (81 mg total) by mouth daily.   buPROPion 150 MG 12 hr tablet Commonly known as: WELLBUTRIN SR Take 150 mg by mouth every morning.   Coricidin HBP 10-325-2 MG Tabs Generic drug: DM-APAP-CPM Take by mouth.   Eliquis 5 MG Tabs tablet Generic drug: apixaban TAKE 1 TABLET BY MOUTH TWICE A DAY   gabapentin 300 MG capsule Commonly known as: NEURONTIN TAKE 2 CAPSULES (600 MG TOTAL) BY MOUTH AT BEDTIME.   HYDROcodone-acetaminophen 5-325 MG tablet Commonly known as: NORCO/VICODIN Take 1 tablet by mouth every 4 (four) hours as needed.   Jakafi 10 MG tablet Generic drug: ruxolitinib phosphate TAKE 1 TABLET (10 MG TOTAL) BY MOUTH 2 (TWO) TIMES DAILY.   losartan 50 MG tablet Commonly known as: COZAAR TAKE 1 TABLET BY MOUTH EVERY DAY   Mens Multivitamin Tabs Take 1 tablet by mouth daily.   propranolol ER 160 MG SR capsule Commonly known as: INDERAL LA Take 1 capsule (160 mg total) by mouth daily.   pseudoephedrine-acetaminophen 30-500 MG Tabs tablet Commonly known as: TYLENOL SINUS Take 1 tablet by mouth every 4 (four) hours as needed.   rosuvastatin 20 MG tablet Commonly known as: CRESTOR Take  1 tablet (20 mg total) by mouth daily.   rosuvastatin 5 MG tablet Commonly known as: CRESTOR Take 20 mg by mouth daily.   valACYclovir 500 MG tablet Commonly known as: VALTREX Take 1 tablet (500 mg total) by mouth daily.   zolpidem 12.5 MG CR tablet Commonly known as: AMBIEN CR Take 6.25 mg by mouth at bedtime.       Allergies:  Allergies  Allergen Reactions  . Zoster Vaccine Recombinant, Adjuvanted Anaphylaxis and Other (See Comments)    03/28/2020 Patient reports only had fever.     Past Medical History, Surgical history, Social history, and Family History were reviewed and updated.  Review of Systems: Review of Systems  Constitutional: Negative.   HENT: Negative.   Eyes: Negative.   Respiratory:  Negative.   Cardiovascular: Negative.   Gastrointestinal: Negative.   Genitourinary: Negative.   Musculoskeletal: Negative.   Skin: Positive for rash.  Neurological: Negative.   Endo/Heme/Allergies: Negative.   Psychiatric/Behavioral: Negative.      Physical Exam:  weight is 209 lb (94.8 kg). His oral temperature is 98.2 F (36.8 C). His blood pressure is 133/79 and his pulse is 60. His respiration is 20 and oxygen saturation is 99%.   Wt Readings from Last 3 Encounters:  07/04/20 209 lb (94.8 kg)  05/04/20 207 lb 12.8 oz (94.3 kg)  03/28/20 200 lb 1.9 oz (90.8 kg)    Physical Exam Vitals reviewed.  HENT:     Head: Normocephalic and atraumatic.  Eyes:     Pupils: Pupils are equal, round, and reactive to light.  Cardiovascular:     Rate and Rhythm: Normal rate and regular rhythm.     Heart sounds: Normal heart sounds.  Pulmonary:     Effort: Pulmonary effort is normal.     Breath sounds: Normal breath sounds.  Abdominal:     General: Bowel sounds are normal.     Palpations: Abdomen is soft.     Comments: I cannot palpate his spleen.  Musculoskeletal:        General: No tenderness or deformity. Normal range of motion.     Cervical back: Normal range of motion.  Lymphadenopathy:     Cervical: No cervical adenopathy.  Skin:    General: Skin is warm and dry.     Findings: No erythema or rash.     Comments: He has the healing zoster rash in the left T7 dermatome.  There is some sensitivity to touch in this area.  Neurological:     Mental Status: He is alert and oriented to person, place, and time.  Psychiatric:        Behavior: Behavior normal.        Thought Content: Thought content normal.        Judgment: Judgment normal.      Lab Results  Component Value Date   WBC 11.2 (H) 07/04/2020   HGB 13.0 07/04/2020   HCT 38.8 (L) 07/04/2020   MCV 89.8 07/04/2020   PLT 342 07/04/2020   Lab Results  Component Value Date   FERRITIN 112 05/04/2020   IRON 143  05/04/2020   TIBC 393 05/04/2020   UIBC 250 05/04/2020   IRONPCTSAT 36 05/04/2020   Lab Results  Component Value Date   RETICCTPCT 3.2 (H) 12/04/2018   RBC 4.32 07/04/2020   RETICCTABS 87.6 02/21/2011   No results found for: KPAFRELGTCHN, LAMBDASER, KAPLAMBRATIO No results found for: IGGSERUM, IGA, IGMSERUM No results found for: TOTALPROTELP, ALBUMINELP, A1GS, A2GS, BETS,  Truddie Crumble, SPEI   Chemistry      Component Value Date/Time   NA 141 07/04/2020 1445   NA 144 04/25/2017 1506   NA 139 04/12/2016 1113   K 4.4 07/04/2020 1445   K 4.2 04/25/2017 1506   K 4.1 04/12/2016 1113   CL 103 07/04/2020 1445   CL 105 04/25/2017 1506   CO2 30 07/04/2020 1445   CO2 29 04/25/2017 1506   CO2 22 04/12/2016 1113   BUN 20 07/04/2020 1445   BUN 17 04/25/2017 1506   BUN 16.3 04/12/2016 1113   CREATININE 1.06 07/04/2020 1445   CREATININE 1.2 04/25/2017 1506   CREATININE 0.9 04/12/2016 1113      Component Value Date/Time   CALCIUM 10.2 07/04/2020 1445   CALCIUM 9.5 04/25/2017 1506   CALCIUM 9.1 04/12/2016 1113   ALKPHOS 51 07/04/2020 1445   ALKPHOS 73 04/25/2017 1506   ALKPHOS 66 04/12/2016 1113   AST 37 07/04/2020 1445   AST 31 04/12/2016 1113   ALT 39 07/04/2020 1445   ALT 58 (H) 04/25/2017 1506   ALT 36 04/12/2016 1113   BILITOT 1.3 (H) 07/04/2020 1445   BILITOT 0.73 04/12/2016 1113       Impression and Plan: [name] [name] is a very pleasant 65 yo caucasian gentleman with polycythemia vera, JAK2 positive.   I think the [name] [name] is doing quite well for him.  I am very happy that he has done well with the [facility].  Again I really cannot palpate his spleen.  I looked at his blood smear under the microscope.  I do not see anything that looked suspicious for any activity with respect to the polycythemia/myelofibrosis.  I think we can get him back in 2 months.  I would like to get an ultrasound of his abdomen we will see him back so we can see what his spleen it looks  like.  I have to believe that the spleen is shrinking.  I am just glad that he is done well with the [facility].  I know that he is going to enjoy his retirement.  He has a daughter in [place] and a daughter in [place].  I am sure he will plan on going there later on this year.       [name], MD 2/28/20223:28 PM

## 2020-07-05 LAB — IRON AND TIBC
Iron: 162 ug/dL (ref 42–163)
Saturation Ratios: 36 % (ref 20–55)
TIBC: 444 ug/dL — ABNORMAL HIGH (ref 202–409)
UIBC: 282 ug/dL (ref 117–376)

## 2020-07-05 LAB — FERRITIN: Ferritin: 118 ng/mL (ref 24–336)

## 2020-08-02 MED FILL — JAKAFI 10 MG TABLET: 10 | 30 days supply | Qty: 60 | Fill #2

## 2020-08-04 ENCOUNTER — Other Ambulatory Visit (HOSPITAL_COMMUNITY): Payer: Self-pay

## 2020-08-18 ENCOUNTER — Ambulatory Visit: Payer: Managed Care, Other (non HMO) | Admitting: Adult Health

## 2020-08-22 ENCOUNTER — Encounter: Payer: Self-pay | Admitting: Adult Health

## 2020-08-22 ENCOUNTER — Other Ambulatory Visit: Payer: Self-pay

## 2020-08-22 ENCOUNTER — Ambulatory Visit: Payer: Managed Care, Other (non HMO) | Admitting: Adult Health

## 2020-08-22 VITALS — BP 142/85 | HR 56 | Ht 71.0 in | Wt 210.0 lb

## 2020-08-22 DIAGNOSIS — G4730 Sleep apnea, unspecified: Secondary | ICD-10-CM | POA: Diagnosis not present

## 2020-08-22 DIAGNOSIS — G44229 Chronic tension-type headache, not intractable: Secondary | ICD-10-CM

## 2020-08-22 DIAGNOSIS — G629 Polyneuropathy, unspecified: Secondary | ICD-10-CM | POA: Diagnosis not present

## 2020-08-22 DIAGNOSIS — R0683 Snoring: Secondary | ICD-10-CM

## 2020-08-22 DIAGNOSIS — Z8673 Personal history of transient ischemic attack (TIA), and cerebral infarction without residual deficits: Secondary | ICD-10-CM | POA: Diagnosis not present

## 2020-08-22 DIAGNOSIS — R202 Paresthesia of skin: Secondary | ICD-10-CM | POA: Diagnosis not present

## 2020-08-22 NOTE — Patient Instructions (Signed)
We will do lab work to rule out reversible causes of your continued neuropathy  If normal, may consider further evaluating with spine imaging  Will f/u with Dr. Rexene Alberts regarding use of dental device or possibly needing to repeat sleep study - will update you via MyChart  For headaches, trial use of Zyrtec daily and Flonase twice daily as well as use of saline rinses.  If headaches persist, may need to consider further treatment options   F/u with Dr. Leonie Man in 3 months or call earlier if needed      Restless Legs Syndrome Restless legs syndrome is a condition that causes uncomfortable feelings or sensations in the legs, especially while sitting or lying down. The sensations usually cause an overwhelming urge to move the legs. The arms can also sometimes be affected. The condition can range from mild to severe. The symptoms often interfere with a person's ability to sleep. What are the causes? The cause of this condition is not known. What increases the risk? The following factors may make you more likely to develop this condition:  Being older than 50.  Pregnancy.  Being a woman. In general, the condition is more common in women than in men.  A family history of the condition.  Having iron deficiency.  Overuse of caffeine, nicotine, or alcohol.  Certain medical conditions, such as kidney disease, Parkinson's disease, or nerve damage.  Certain medicines, such as those for high blood pressure, nausea, colds, allergies, depression, and some heart conditions. What are the signs or symptoms? The main symptom of this condition is uncomfortable sensations in the legs, such as:  Pulling.  Tingling.  Prickling.  Throbbing.  Crawling.  Burning. Usually, the sensations:  Affect both sides of the body.  Are worse when you sit or lie down.  Are worse at night. These may wake you up or make it difficult to fall asleep.  Make you have a strong urge to move your legs.  Are  temporarily relieved by moving your legs. The arms can also be affected, but this is rare. People who have this condition often have tiredness during the day because of their lack of sleep at night. How is this diagnosed? This condition may be diagnosed based on:  Your symptoms.  Blood tests. In some cases, you may be monitored in a sleep lab by a specialist (a sleep study). This can detect any disruptions in your sleep. How is this treated? This condition is treated by managing the symptoms. This may include:  Lifestyle changes, such as exercising, using relaxation techniques, and avoiding caffeine, alcohol, or tobacco.  Medicines. Anti-seizure medicines may be tried first. Follow these instructions at home: General instructions  Take over-the-counter and prescription medicines only as told by your health care provider.  Use methods to help relieve the uncomfortable sensations, such as: ? Massaging your legs. ? Walking or stretching. ? Taking a cold or hot bath.  Keep all follow-up visits as told by your health care provider. This is important. Lifestyle  Practice good sleep habits. For example, go to bed and get up at the same time every day. Most adults should get 7-9 hours of sleep each night.  Exercise regularly. Try to get at least 30 minutes of exercise most days of the week.  Practice ways of relaxing, such as yoga or meditation.  Avoid caffeine and alcohol.  Do not use any products that contain nicotine or tobacco, such as cigarettes and e-cigarettes. If you need help quitting, ask  your health care provider.      Contact a health care provider if:  Your symptoms get worse or they do not improve with treatment. Summary  Restless legs syndrome is a condition that causes uncomfortable feelings or sensations in the legs, especially while sitting or lying down.  The symptoms often interfere with a person's ability to sleep.  This condition is treated by managing  the symptoms. You may need to make lifestyle changes or take medicines. This information is not intended to replace advice given to you by your health care provider. Make sure you discuss any questions you have with your health care provider. Document Revised: 06/12/2019 Document Reviewed: 05/13/2017 Elsevier Patient Education  2021 [employer].

## 2020-08-22 NOTE — Progress Notes (Signed)
[facility] [address] [place] [address] [phone]       OFFICE FOLLOW UP NOTE  [name] Date of Birth:  Aug 10, 1955 Medical Record Number:  [id number]   Reason for Referral: stroke follow up   CHIEF COMPLAINT:  Chief Complaint  Patient presents with  . Follow-up    Rm 14 alone Stable from stroke standpoint Multiple other concerns    HPI:  Today, 08/22/2020, Mr. [name] returns for prolonged stroke follow-up after prior visit 11 months ago  Stable from stroke standpoint without new stroke/TIA symptoms Compliant on Eliquis and aspirin without associated side effects Compliant on atorvastatin without associated side effects Blood pressure today 142/85  He continues to have multiple chronic complaints including BUE and BLE burning type sensation, mild intermittent frontal headaches, occasional "odd sensation" lower face, short lasting flushed sensation and occasional "catch" in his speech.  All symptoms chronic without any being new or acute or worsening  EMG/NCV 10/13/2019 unremarkable - BUE and RLE only Does admit to chronic history of lower back pain with intermittent flareups with associated radiculopathy usually R>L.  Also has occasional neck pain with tightness sensation. Does report yesterday wife was rubbing his left hand due to pain and felt a shock type sensation on the left side of his neck.  Otherwise denies radiculopathy symptoms Burning type sensation typically only present in the evening especially while sitting in recliner.  He does admit to uncomfortable type sensation where he feels like he needs to move his legs and will improve after walking.  Burning type sensation usually only present in his toes.  He will take gabapentin 600 mg nightly - denies burning pains or uncomfortable sensation interfering with sleep.   HST 07/08/2019 showed mild OSA with AHI 10.1/h and recommended use of CPAP.  He declined CPAP use and wanted to  further look into dental device. He does report speaking with his dentist regarding dental device and is interested in pursuing further.  Chronic migraines usually frontal area with improvement after use of allergy medication. Headaches are not debilitating.  Denies associated nausea/vomiting, photophobia, phonophobia or visual changes. occasional right sided short lasting sharp type sensation which has been ongoing without any recent increase or worsening  Routinely followed by oncology for polycythemia vera and iron deficiency secondary to phlebotomies currently on Jakifi, routine phlebotomy to maintain hematocrit below 45%, Eliquis, aspirin and IV iron as indicated  No further concerns at this time      History provided for reference purposes only Update 09/09/2019 JM: [name] returns for stroke follow-up.  He has been stable from a stroke standpoint without new or recurrent stroke/TIA symptoms.  He did undergo home sleep study which did show mild sleep apnea and recommended treatment of CPAP in light of his medical history of stroke and history of atrial fibrillation along with ongoing complaints of daytime fatigue.  CPAP not initiated at this time as he is questioning need of treatment especially as apnea mild.  Continues on Eliquis, aspirin and atorvastatin 40 mg daily for secondary stroke prevention.  Blood pressure today 142/76.  He continues to have complaints of bilateral lower extremity numbness/tingling with nerve pain greater at night as well as right hand numbness/tingling - has not underwent EMG/NCV that was previously ordered.  He has continued on gabapentin 300 mg daily with mild benefit and tolerating well.  He also endorses increased headaches with sinus issues.  No further concerns at this time.  12/29/2018 update JM: Mr.  [name] [name] is a 65 year old male who is being seen today for 50-month follow-up visit.  He has been doing well from a stroke standpoint without residual deficits.   He does come in today with a long list of chronic complaints including tingling/burning sensation in feet bilaterally with tight/cramping sensation.  Tingling/numbness right hand, puffiness under left eye, odd feelings or sensations with at times lightheaded lasting for less than 1 minute and then resolve, occasional balance difficulties, occasional headaches, and decreased concentration/focus towards end of the day.  All symptoms have been present over the past several years and denies worsening with recent stroke.  He has not had prior work-up regarding numbness/tingling or nerve pain complaints.  He has not previously been on medication therapy for nerve pain.  Pain is typically worsened at night while he is sitting and relaxing.  He also endorses occasional cold sensation of lower extremities and right hand.  He does take Ambien 6.25 mg and melatonin 5 mg nightly due to insomnia.  He will occasionally wake up throughout the night with at times difficulty returning to sleep.  He does endorse daytime fatigue but does not typically stop to take a nap.  He has not previously underwent sleep study.  He continues on Eliquis 5 mg twice daily and aspirin half tab daily due to experiencing bright red blood with bowel movements and lasted for approximately 2 to 3 days approximately 3 weeks ago.  He stopped use of aspirin initially with resolution of symptoms and restarted half tab without recurrent symptoms.  Continues on aspirin from cardiac standpoint.  Continues on atorvastatin 40 mg daily.  Blood pressure today 120/69.  No further concerns at this time.  10/28/2018 virtual visit JM: he has been stable from a stroke standpoint without residual deficits  He has had 2 additional episodes of right hand and foot cold sensation which lasted approximately 1 hour -denies weakness, numbness or tingling Decreased energy level which is slowly improving ?? 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 Hymen Arnett is a 65 y.o. male with history of polycythemia, hypertension, PVCs, brief PAT and paroxysmal atrial fibrillation seen on monitor in February 2020-on Eliquis,   who presented with right upper and lower extremity paresthesias.  He did not receive IV t-PA due to anticoagulation and late presentation (>4.5 hours from time of onset).  Stroke work-up revealed subcentimeter acute/late subacute infarction within the cortex of the left superior precentral gyrus likely cardioembolic.  Recommended continuation of Eliquis and potentially adding aspirin 81 mg which was recommended to be decided by cardiology.  HTN stable.  LDL 76 and recommended atorvastatin 40 mg daily.  Other stroke risk factors include advanced age, EtOH use, history of stroke and atrial fibrillation.  He was discharged home in stable condition without therapy needs.       ROS:   14 system review of systems performed and negative with exception of see HPI  PMH:  Past Medical History:  Diagnosis Date  . Diverticulosis of colon (without mention of hemorrhage)   . Hemorrhoids   . Hypertension   . IBS (irritable bowel syndrome)   . Iron deficiency anemia due to chronic blood loss 09/26/2017  . Irregular heartbeat   . Nephrolithiasis   . Other and unspecified hyperlipidemia   . Perirectal fistula   . Polycythemia, secondary   . Stroke ([place])   . Stye    blocker  duct right eye lid    PSH:  Past Surgical History:  Procedure Laterality Date  . RETINAL DETACHMENT SURGERY    . ROTATOR CUFF REPAIR     rt.  Marland Kitchen VASECTOMY      Social History:  Social History   Socioeconomic History  . Marital status: Married    Spouse name: Not on file  . Number of children: 2  . Years of education: Not on file  . Highest education level: Not on file  Occupational History  . Occupation: Lobbyist: [employer]  Tobacco Use  . Smoking status: Never Smoker  . Smokeless tobacco: Never Used  Vaping Use  . Vaping Use: Never used  Substance and Sexual Activity  . Alcohol use: Yes    Alcohol/week: 0.0 standard drinks    Comment: Occasional  . Drug use: No  . Sexual activity: Not on file  Other Topics Concern  . Not on file  Social History Narrative  . Not on file   Social Determinants of Health   Financial Resource Strain: Not on file  Food Insecurity: Not on file  Transportation Needs: Not on file  Physical Activity: Not on file  Stress: Not on file  Social Connections: Not on file  Intimate Partner Violence: Not on file    Family History:  Family History  Problem Relation Age of Onset  . Aortic aneurysm Father   . Heart attack Brother   . Cerebral palsy Brother   . Colon cancer Neg Hx   . Esophageal cancer Neg Hx   . Pancreatic cancer Neg Hx   . Prostate cancer Neg Hx   . Rectal cancer Neg Hx   . Stomach cancer Neg Hx     Medications:   Current Outpatient Medications on File Prior to Visit  Medication Sig Dispense Refill  . acetaminophen (TYLENOL) 500 MG tablet Take 1,000 mg by mouth every 6 (six) hours as needed for mild pain.     Marland Kitchen aspirin EC 81 MG EC tablet Take 1 tablet (81 mg total) by mouth daily. 60 tablet 0  . buPROPion (WELLBUTRIN SR) 150 MG 12 hr tablet Take 150 mg by mouth every morning.  12  . DM-APAP-CPM (CORICIDIN HBP) 10-325-2 MG TABS Take by mouth.    Arne Cleveland 5 MG TABS tablet TAKE 1 TABLET BY MOUTH  TWICE A DAY 180 tablet 1  . gabapentin (NEURONTIN) 300 MG capsule TAKE 2 CAPSULES (600 MG TOTAL) BY MOUTH AT BEDTIME. 60 capsule 5  . losartan (COZAAR) 50 MG tablet TAKE 1 TABLET BY MOUTH EVERY DAY 90 tablet 3  . Multiple Vitamins-Minerals (MENS MULTIVITAMIN) TABS Take 1 tablet by mouth daily.    . propranolol ER (INDERAL LA) 160 MG SR capsule Take 1 capsule (160 mg total) by mouth daily. 90 capsule 2  . pseudoephedrine-acetaminophen (TYLENOL SINUS) 30-500 MG TABS tablet Take 1 tablet by mouth every 4 (four) hours as needed.    . rosuvastatin (CRESTOR) 5 MG tablet Take 20 mg  by mouth daily.    . ruxolitinib phosphate (JAKAFI) 10 MG tablet TAKE 1 TABLET BY MOUTH 2 TIMES DAILY. 60 tablet 4  . zolpidem (AMBIEN CR) 12.5 MG CR tablet Take 6.25 mg by mouth at bedtime.      No current facility-administered medications on file prior to visit.    Allergies:   Allergies  Allergen Reactions  . Zoster Vaccine Recombinant, Adjuvanted Anaphylaxis and Other (See Comments)    03/28/2020 Patient reports only had fever.      Physical Exam  Today's Vitals   08/22/20 0826  BP: (!) 142/85  Pulse: (!) 56  Weight: 210 lb (95.3 kg)  Height: 5\' 11"  (1.803 m)   Body mass index is 29.29 kg/m.  General: well developed, well nourished, pleasant middle-age Caucasian male, seated, in no evident distress Head: head normocephalic and atraumatic.   Neck: supple with no carotid or supraclavicular bruits Cardiovascular: regular rate and rhythm, no murmurs Musculoskeletal: no deformity Skin:  no rash/petichiae Vascular:  Normal pulses all extremities   Neurologic Exam Mental Status: Awake and fully alert. Fluent speech and language. Oriented to place and time. Recent and remote memory intact. Attention span, concentration and fund of knowledge appropriate. Mood and affect appropriate.  Cranial Nerves: Pupils equal, briskly reactive to light. Extraocular movements full without nystagmus. Visual fields full to  confrontation. Hearing intact. Facial sensation intact. Face, tongue, palate moves normally and symmetrically.  Motor: Normal bulk and tone. Normal strength in all tested extremity muscles. Sensory.: intact to light touch in all extremities. Decreased vibratory LLE distal and LUE distal. Decreased pinprick sensation fingers bilaterally Coordination: Rapid alternating movements normal in all extremities. Finger-to-nose and heel-to-shin performed accurately bilaterally. Gait and Station: Arises from chair without difficulty. Stance is normal. Gait demonstrates normal stride length and balance without use of assistive device  Reflexes: 1+ and symmetric. Toes downgoing.        ASSESSMENT: [name] [name] is a 65 y.o. 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 Vascular risk factors include polycythemia, HTN, HLD, atrial fibrillation and prior stroke.  Stable from stroke standpoint without residual deficits.  He continues to experience bilateral lower extremity paresthesias and right hand numbness/tingling.    PLAN:  1. L precentral gyrus stroke:  a. Continue aspirin 81 mg daily and Eliquis (apixaban) daily  and lipitor 40mg   for secondary stroke prevention.  b. Discussed secondary stroke prevention measures and importance of close PCP follow-up for aggressive stroke risk factor management  c. HTN: BP goal 130/90. Stable.  Continue to follow with PCP for monitoring management d. HLD: LDL<70. Continue atorvastatin and ongoing follow-up with PCP for prescribing, monitoring and management 2. Atrial fibrillation: Ongoing follow-up with cardiology for Eliquis and atrial fibrillation management 3. Mild OSA: HST 07/2019 mild OSA.  He is interested in pursuing dental device.  Will ensure repeat sleep study is not needed prior to placing referral 4. paraesthesias : unknown etiology. chronic symptoms -EMG/NCV 10/13/2019  BUE and RLE unremarkable. Obtain neuropathy panel to rule out other causes. May consider spinal images with chronic lower back and cervical pain with radiculopathy.  Other considering etiology of restless leg syndrome especially with known iron deficiency-encouraged continued use of gabapentin and may consider use of Requip in the future 5. Frontal headaches: possibly tension vs sinus related. No recent change in frequency or characteristics therefore no indication for imaging at this time.  Recommend increasing allergy regimen with use  of daily histamine, daily nasal spray and saline rinses - advised further discussion with PCP.  May consider pharmacological management in the future if indicated although currently on gabapentin, bupropion and propranolol.  Also educated on untreated sleep apnea which may be contributing    Follow-up in 3 months with Dr. Leonie Man for further evaluation or call earlier if needed   I spent 40 minutes of face-to-face and non-face-to-face time with patient.  This included previsit chart review, lab review, study review, order entry, electronic health record documentation, patient education regarding prior history of stroke, importance of managing stroke risk factors, importance of sleep apnea treatment and use of dental device, ongoing neuropathic pains and possible contributing factors, frontal headaches a contributing factors and answered all questions to patient satisfaction  [name], AGNP-BC  [facility] [address] [place], [place] [address]  Phone [phone] Fax [phone] Note: This document was prepared with digital dictation and possible smart phrase technology. Any transcriptional errors that result from this process are unintentional.

## 2020-08-23 LAB — HIV ANTIBODY (ROUTINE TESTING W REFLEX): HIV Screen 4th Generation wRfx: NONREACTIVE

## 2020-08-23 LAB — B. BURGDORFI ANTIBODIES: Lyme IgG/IgM Ab: <0.91 {ISR} (ref 0.00–0.90)

## 2020-08-23 LAB — VITAMIN B12: Vitamin B-12: 1133 pg/mL (ref 232–1245)

## 2020-08-23 LAB — ANGIOTENSIN CONVERTING ENZYME: Angio Convert Enzyme: 17 U/L (ref 14–82)

## 2020-08-23 LAB — TSH: TSH: 2.82 u[IU]/mL (ref 0.450–4.500)

## 2020-08-23 LAB — SJOGREN'S SYNDROME ANTIBODS(SSA + SSB)
ENA SSA (RO) Ab: <0.2 AI (ref 0.0–0.9)
ENA SSB (LA) Ab: <0.2 AI (ref 0.0–0.9)

## 2020-08-23 LAB — SEDIMENTATION RATE: Sed Rate: 2 mm/hr (ref 0–30)

## 2020-08-27 ENCOUNTER — Other Ambulatory Visit: Payer: Self-pay

## 2020-08-27 ENCOUNTER — Ambulatory Visit (HOSPITAL_BASED_OUTPATIENT_CLINIC_OR_DEPARTMENT_OTHER)
Admission: RE | Admit: 2020-08-27 | Discharge: 2020-08-27 | Disposition: A | Discharge status: Home / Self Care | Payer: Managed Care, Other (non HMO) | Source: Ambulatory Visit | Attending: Hematology & Oncology | Admitting: Hematology & Oncology

## 2020-08-27 DIAGNOSIS — D751 Secondary polycythemia: Secondary | ICD-10-CM | POA: Insufficient documentation

## 2020-08-29 ENCOUNTER — Inpatient Hospital Stay (HOSPITAL_BASED_OUTPATIENT_CLINIC_OR_DEPARTMENT_OTHER): Payer: Managed Care, Other (non HMO) | Admitting: Hematology & Oncology

## 2020-08-29 ENCOUNTER — Other Ambulatory Visit (HOSPITAL_COMMUNITY): Payer: Self-pay

## 2020-08-29 ENCOUNTER — Encounter: Payer: Self-pay | Admitting: Hematology & Oncology

## 2020-08-29 ENCOUNTER — Telehealth: Payer: Self-pay

## 2020-08-29 ENCOUNTER — Other Ambulatory Visit: Payer: Self-pay

## 2020-08-29 ENCOUNTER — Inpatient Hospital Stay: Payer: Managed Care, Other (non HMO) | Attending: Hematology & Oncology

## 2020-08-29 VITALS — BP 129/92 | HR 54 | Temp 98.2°F | Resp 18 | Wt 209.0 lb

## 2020-08-29 DIAGNOSIS — Z7982 Long term (current) use of aspirin: Secondary | ICD-10-CM | POA: Diagnosis not present

## 2020-08-29 DIAGNOSIS — D751 Secondary polycythemia: Secondary | ICD-10-CM

## 2020-08-29 DIAGNOSIS — Z7901 Long term (current) use of anticoagulants: Secondary | ICD-10-CM | POA: Diagnosis not present

## 2020-08-29 DIAGNOSIS — D45 Polycythemia vera: Secondary | ICD-10-CM | POA: Diagnosis present

## 2020-08-29 LAB — CBC WITH DIFFERENTIAL (CANCER CENTER ONLY)
Abs Immature Granulocytes: 0.4 10*3/uL — ABNORMAL HIGH (ref 0.00–0.07)
Basophils Absolute: 0.2 10*3/uL — ABNORMAL HIGH (ref 0.0–0.1)
Basophils Relative: 2 %
Eosinophils Absolute: 0.4 10*3/uL (ref 0.0–0.5)
Eosinophils Relative: 4 %
HCT: 36 % — ABNORMAL LOW (ref 39.0–52.0)
Hemoglobin: 12.1 g/dL — ABNORMAL LOW (ref 13.0–17.0)
Immature Granulocytes: 4 %
Lymphocytes Relative: 14 %
Lymphs Abs: 1.3 10*3/uL (ref 0.7–4.0)
MCH: 29.4 pg (ref 26.0–34.0)
MCHC: 33.6 g/dL (ref 30.0–36.0)
MCV: 87.6 fL (ref 80.0–100.0)
Monocytes Absolute: 0.9 10*3/uL (ref 0.1–1.0)
Monocytes Relative: 9 %
Neutro Abs: 6.4 10*3/uL (ref 1.7–7.7)
Neutrophils Relative %: 67 %
Platelet Count: 289 10*3/uL (ref 150–400)
RBC: 4.11 MIL/uL — ABNORMAL LOW (ref 4.22–5.81)
RDW: 17.2 % — ABNORMAL HIGH (ref 11.5–15.5)
WBC Count: 9.5 10*3/uL (ref 4.0–10.5)
nRBC: 1.4 % — ABNORMAL HIGH (ref 0.0–0.2)

## 2020-08-29 LAB — CMP (CANCER CENTER ONLY)
ALT: 26 U/L (ref 0–44)
AST: 29 U/L (ref 15–41)
Albumin: 4.7 g/dL (ref 3.5–5.0)
Alkaline Phosphatase: 44 U/L (ref 38–126)
Anion gap: 7 (ref 5–15)
BUN: 12 mg/dL (ref 8–23)
CO2: 28 mmol/L (ref 22–32)
Calcium: 9.4 mg/dL (ref 8.9–10.3)
Chloride: 105 mmol/L (ref 98–111)
Creatinine: 1.03 mg/dL (ref 0.61–1.24)
GFR, Estimated: >60 mL/min (ref >60)
Glucose, Bld: 93 mg/dL (ref 70–99)
Potassium: 3.9 mmol/L (ref 3.5–5.1)
Sodium: 140 mmol/L (ref 135–145)
Total Bilirubin: 1.1 mg/dL (ref 0.3–1.2)
Total Protein: 6.5 g/dL (ref 6.5–8.1)

## 2020-08-29 LAB — IRON AND TIBC
Iron: 153 ug/dL (ref 42–163)
Saturation Ratios: 40 % (ref 20–55)
TIBC: 384 ug/dL (ref 202–409)
UIBC: 230 ug/dL (ref 117–376)

## 2020-08-29 LAB — LACTATE DEHYDROGENASE: LDH: 512 U/L — ABNORMAL HIGH (ref 98–192)

## 2020-08-29 LAB — FERRITIN: Ferritin: 104 ng/mL (ref 24–336)

## 2020-08-29 NOTE — Telephone Encounter (Signed)
appts ade per 08/29/20 los and pt placed in phone  [name] [name]

## 2020-08-29 NOTE — Progress Notes (Signed)
Hematology and Oncology Follow Up Visit  [name] [name] [id number] 1956/01/28 65 y.o. 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 Brazzel is here today for follow-up.  He has now retired.  He is enjoying this.  He is going to have a little bit of a "gathering" for his retirement in a couple weekends.  His daughters are coming into town.  This will be exciting for him.  He did have ultrasound of his abdomen over the weekend.  His spleen is still about the same size.  His spleen measured 874 cm.  Previously, it was 780 cm.  He has seen neurology.  He has some lab work done by neurology.  He is doing okay on the [place].  His blood counts are being maintained.  I do not think we have to make any change in dosing.  He is on Eliquis and aspirin both.  He is doing well with these.  He did bang his left leg recently.  He had a fairly large bruise on their.  This seems to be improving.  He has had no problems with cough or shortness of breath.  He did have a second COVID booster shot.  He has had no change in bowel or bladder habits.  There is no cough or shortness of breath.  He has had no rashes.  He has had no visual changes.  He has had no palpitations.  He does have some headaches.  His blood pressure is little on the high side.  Overall, his performance status is ECOG 1.    Medications:  Allergies as of 08/29/2020      Reactions   Zoster Vaccine Recombinant, Adjuvanted Anaphylaxis, Other (See Comments)   03/28/2020 Patient reports only had fever.       Medication List       Accurate as of August 29, 2020 10:58 AM. If you have any questions, ask your nurse or doctor.        STOP taking these medications   Coricidin HBP 10-325-2 MG Tabs Generic drug: DM-APAP-CPM Stopped by: [name], MD     TAKE these medications   acetaminophen 500 MG tablet Commonly known as: TYLENOL Take 1,000 mg by mouth every 6 (six) hours as needed for mild pain.   aspirin 81 MG EC tablet Take 1 tablet (81 mg total) by mouth daily.   buPROPion 150 MG 12 hr tablet Commonly known as: WELLBUTRIN SR Take 150 mg by mouth every morning.   Eliquis 5 MG Tabs tablet Generic drug: apixaban TAKE 1 TABLET BY MOUTH TWICE A DAY   gabapentin 300 MG capsule Commonly known as: NEURONTIN TAKE 2 CAPSULES (600 MG TOTAL) BY MOUTH AT BEDTIME.   Jakafi 10 MG tablet Generic drug: ruxolitinib phosphate TAKE 1 TABLET BY MOUTH 2 TIMES DAILY.   losartan 50 MG tablet Commonly known as: COZAAR TAKE 1 TABLET BY MOUTH EVERY DAY   Mens Multivitamin Tabs Take 1 tablet by mouth daily.   propranolol ER 160 MG SR capsule Commonly known as: INDERAL LA Take  1 capsule (160 mg total) by mouth daily.   pseudoephedrine-acetaminophen 30-500 MG Tabs tablet Commonly known as: TYLENOL SINUS Take 1 tablet by mouth every 4 (four) hours as needed.   rosuvastatin 20 MG tablet Commonly known as: CRESTOR Take 20 mg by mouth daily.   zolpidem 12.5 MG CR tablet Commonly known as: AMBIEN CR Take 6.25 mg by mouth at bedtime.       Allergies:  Allergies  Allergen Reactions  . Zoster Vaccine Recombinant, Adjuvanted Anaphylaxis and Other (See Comments)    03/28/2020 Patient reports only had fever.     Past Medical History, Surgical history, Social history, and Family History were reviewed and updated.  Review of Systems: Review of Systems  Constitutional: Negative.   HENT: Negative.   Eyes: Negative.   Respiratory: Negative.   Cardiovascular: Negative.    Gastrointestinal: Negative.   Genitourinary: Negative.   Musculoskeletal: Negative.   Skin: Positive for rash.  Neurological: Negative.   Endo/Heme/Allergies: Negative.   Psychiatric/Behavioral: Negative.      Physical Exam:  weight is 209 lb (94.8 kg). His oral temperature is 98.2 F (36.8 C). His blood pressure is 129/92 (abnormal) and his pulse is 54 (abnormal). His respiration is 18 and oxygen saturation is 99%.   Wt Readings from Last 3 Encounters:  08/29/20 209 lb (94.8 kg)  08/22/20 210 lb (95.3 kg)  07/04/20 209 lb (94.8 kg)    Physical Exam Vitals reviewed.  HENT:     Head: Normocephalic and atraumatic.  Eyes:     Pupils: Pupils are equal, round, and reactive to light.  Cardiovascular:     Rate and Rhythm: Normal rate and regular rhythm.     Heart sounds: Normal heart sounds.  Pulmonary:     Effort: Pulmonary effort is normal.     Breath sounds: Normal breath sounds.  Abdominal:     General: Bowel sounds are normal.     Palpations: Abdomen is soft.     Comments: I cannot palpate his spleen.  Musculoskeletal:        General: No tenderness or deformity. Normal range of motion.     Cervical back: Normal range of motion.  Lymphadenopathy:     Cervical: No cervical adenopathy.  Skin:    General: Skin is warm and dry.     Findings: No erythema or rash.     Comments: He has the healing zoster rash in the left T7 dermatome.  There is some sensitivity to touch in this area.  Neurological:     Mental Status: He is alert and oriented to person, place, and time.  Psychiatric:        Behavior: Behavior normal.        Thought Content: Thought content normal.        Judgment: Judgment normal.      Lab Results  Component Value Date   WBC 9.5 08/29/2020   HGB 12.1 (L) 08/29/2020   HCT 36.0 (L) 08/29/2020   MCV 87.6 08/29/2020   PLT 289 08/29/2020   Lab Results  Component Value Date   FERRITIN 118 07/04/2020   IRON 162 07/04/2020   TIBC 444 (H) 07/04/2020    UIBC 282 07/04/2020   IRONPCTSAT 36 07/04/2020   Lab Results  Component Value Date   RETICCTPCT 3.2 (H) 12/04/2018   RBC 4.11 (L) 08/29/2020   RETICCTABS 87.6 02/21/2011   No results found for: KPAFRELGTCHN, LAMBDASER, KAPLAMBRATIO No results found for: IGGSERUM, IGA, IGMSERUM No results found for: TOTALPROTELP,  Ronney Lion, SPEI   Chemistry      Component Value Date/Time   NA 140 08/29/2020 0905   NA 144 04/25/2017 1506   NA 139 04/12/2016 1113   K 3.9 08/29/2020 0905   K 4.2 04/25/2017 1506   K 4.1 04/12/2016 1113   CL 105 08/29/2020 0905   CL 105 04/25/2017 1506   CO2 28 08/29/2020 0905   CO2 29 04/25/2017 1506   CO2 22 04/12/2016 1113   BUN 12 08/29/2020 0905   BUN 17 04/25/2017 1506   BUN 16.3 04/12/2016 1113   CREATININE 1.03 08/29/2020 0905   CREATININE 1.2 04/25/2017 1506   CREATININE 0.9 04/12/2016 1113      Component Value Date/Time   CALCIUM 9.4 08/29/2020 0905   CALCIUM 9.5 04/25/2017 1506   CALCIUM 9.1 04/12/2016 1113   ALKPHOS 44 08/29/2020 0905   ALKPHOS 73 04/25/2017 1506   ALKPHOS 66 04/12/2016 1113   AST 29 08/29/2020 0905   AST 31 04/12/2016 1113   ALT 26 08/29/2020 0905   ALT 58 (H) 04/25/2017 1506   ALT 36 04/12/2016 1113   BILITOT 1.1 08/29/2020 0905   BILITOT 0.73 04/12/2016 1113       Impression and Plan: Mr. Ocain is a very pleasant 65 yo caucasian gentleman with polycythemia vera, JAK2 positive.   I think the [place] Brow is doing quite well for him.  I am very happy that he has done well with the [facility].  Again I really cannot palpate his spleen.  I looked at his blood smear under the microscope.  I do not see anything that looked suspicious for any activity with respect to the polycythemia/myelofibrosis.  I think we can still get him back in 2 months.  I do not think that we need any kind of ultrasound of his abdomen for several months.  I think as long as his blood counts are holding  steady, we should be doing okay.   Hopefully, he will be able to exercise more when he is retired.  I know that he is going to quite a lot when he is retired.    I think that he is going up to [facility] before we see him back.  I am sure he will have a wonderful time up there.  [name], MD 4/25/202210:58 AM

## 2020-08-30 ENCOUNTER — Other Ambulatory Visit (HOSPITAL_COMMUNITY): Payer: Self-pay

## 2020-08-30 MED FILL — Ruxolitinib Phosphate Tab 10 MG (Base Equivalent): ORAL | 30 days supply | Qty: 60 | Fill #0 | Status: AC

## 2020-09-01 ENCOUNTER — Other Ambulatory Visit (HOSPITAL_COMMUNITY): Payer: Self-pay

## 2020-09-15 ENCOUNTER — Other Ambulatory Visit: Payer: Self-pay | Admitting: Adult Health

## 2020-09-26 ENCOUNTER — Other Ambulatory Visit (HOSPITAL_COMMUNITY): Payer: Self-pay

## 2020-09-29 ENCOUNTER — Other Ambulatory Visit (HOSPITAL_COMMUNITY): Payer: Self-pay

## 2020-09-29 MED FILL — Ruxolitinib Phosphate Tab 10 MG (Base Equivalent): ORAL | 30 days supply | Qty: 60 | Fill #1 | Status: AC

## 2020-10-12 ENCOUNTER — Other Ambulatory Visit: Payer: Self-pay | Admitting: Cardiology

## 2020-10-12 DIAGNOSIS — R002 Palpitations: Secondary | ICD-10-CM

## 2020-10-12 NOTE — Telephone Encounter (Signed)
eliquis refilled. 91m, 94.8kg, scr 1.03 08/29/20, lovw/crenshaw 09/02/19

## 2020-10-18 NOTE — Progress Notes (Signed)
HPI:  FU PAF. Stress echocardiogram June 2011 normal. Monitor February 2020 revealed sinus bradycardia, NSR, sinus tach, pacs, PVC, brief PAT and PAF. Echo May 2020 showed normal LV function, mild diastolic dysfunction, mild left ventricular hypertrophy and negative saline microcavitation study. Admitted with CVA May 2020. 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 Aspirin added to apixaban by neurology as CVA occurred on apixaban. Patient was seen in follow-up by Dr. Marin Olp and it was felt that polycythemia may be contributing to his recent CVA.  Renal Dopplers May 2021 showed no renal artery stenosis.  Calcium score 5/21 18 which was 71st percentile.  Abdominal ultrasound April 2022 showed no aneurysm.  Since last seen there is no dyspnea, exertional chest pain or syncope.  Occasional brief palpitations.  Current Outpatient Medications  Medication Sig Dispense Refill   acetaminophen (TYLENOL) 500 MG tablet Take 1,000 mg by mouth every 6 (six) hours as needed for mild pain.      aspirin EC 81 MG EC tablet Take 1 tablet (81 mg total) by mouth daily. 60 tablet 0   buPROPion (WELLBUTRIN SR) 150 MG 12 hr tablet Take 150 mg by mouth every morning.  12   ELIQUIS 5 MG TABS tablet TAKE 1 TABLET BY MOUTH TWICE A DAY 180 tablet 1   gabapentin (NEURONTIN) 300 MG capsule TAKE 2 CAPSULES (600 MG TOTAL) BY MOUTH AT BEDTIME. 60 capsule 5   losartan (COZAAR) 50 MG tablet TAKE 1 TABLET BY MOUTH EVERY DAY 90 tablet 3   Multiple Vitamins-Minerals (MENS MULTIVITAMIN) TABS Take 1 tablet by mouth daily.     propranolol ER (INDERAL LA) 160 MG SR capsule TAKE 1 CAPSULE BY MOUTH DAILY. 90 capsule 2   pseudoephedrine-acetaminophen (TYLENOL SINUS) 30-500 MG TABS tablet Take 1 tablet by mouth every 4 (four) hours as needed.     rosuvastatin  (CRESTOR) 20 MG tablet Take 20 mg by mouth daily.     ruxolitinib phosphate (JAKAFI) 10 MG tablet TAKE 1 TABLET BY MOUTH 2 TIMES DAILY. 60 tablet 4   zolpidem (AMBIEN CR) 12.5 MG CR tablet Take 6.25 mg by mouth at bedtime.      No current facility-administered medications for this visit.     Past Medical History:  Diagnosis Date   Diverticulosis of colon (without mention of hemorrhage)    Hemorrhoids    Hypertension    IBS (irritable bowel syndrome)    Iron deficiency anemia due to chronic blood loss 09/26/2017   Irregular heartbeat    Nephrolithiasis    Other and unspecified hyperlipidemia    Perirectal fistula    Polycythemia, secondary    Stroke (HCC)    Stye    blocker duct right eye lid    Past Surgical History:  Procedure Laterality Date   RETINAL DETACHMENT SURGERY     ROTATOR CUFF REPAIR     rt.   VASECTOMY      Social History   Socioeconomic History   Marital status: Married    Spouse name: Not on file   Number of children: 2   Years of education: Not on file   Highest education level: Not on file  Occupational History   Occupation: ENGINEER    Employer: [employer]  Tobacco Use   Smoking status: Never   Smokeless tobacco: Never  Vaping Use   Vaping Use:  Never used  Substance and Sexual Activity   Alcohol use: Yes    Alcohol/week: 0.0 standard drinks    Comment: Occasional   Drug use: No   Sexual activity: Not on file  Other Topics Concern   Not on file  Social History Narrative   Not on file   Social Determinants of Health   Financial Resource Strain: Not on file  Food Insecurity: Not on file  Transportation Needs: Not on file  Physical Activity: Not on file  Stress: Not on file  Social Connections: Not on file  Intimate Partner Violence: Not on file    Family History  Problem Relation Age of Onset   Aortic aneurysm Father    Heart attack Brother    Cerebral palsy Brother    Colon cancer Neg Hx    Esophageal cancer Neg Hx     Pancreatic cancer Neg Hx    Prostate cancer Neg Hx    Rectal cancer Neg Hx    Stomach cancer Neg Hx     ROS: no fevers or chills, productive cough, hemoptysis, dysphasia, odynophagia, melena, hematochezia, dysuria, hematuria, rash, seizure activity, orthopnea, PND, pedal edema, claudication. Remaining systems are negative.  Physical Exam: Well-developed well-nourished in no acute distress.  Skin is warm and dry.  HEENT is normal.  Neck is supple.  Chest is clear to auscultation with normal expansion.  Cardiovascular exam is regular rate and rhythm.  Abdominal exam nontender or distended. No masses palpated. Extremities show no edema. neuro grossly intact  ECG-normal sinus rhythm at a rate of 60, left axis deviation, no ST changes.  Personally reviewed  A/P  1 paroxysmal atrial fibrillation-no recurrences based on history.  We will continue Inderal and apixaban.  As outlined in previous notes his prior CVA occurred while on apixaban and aspirin was added to his medical regimen per oncology.  2 hypertension-patient's blood pressure is elevated; increase losartan to 100 mg daily and follow.  Check potassium and renal function in 1 week.  3 hyperlipidemia-continue statin.  4 question fibromuscular dysplasia on prior CTA-renal Doppler showed no renal artery stenosis and not suggestive of fibromuscular dysplasia.  5 polycythemia vera-Per oncology.  Kirk Ruths, MD

## 2020-10-24 ENCOUNTER — Telehealth: Payer: Self-pay | Admitting: Pharmacy Technician

## 2020-10-24 ENCOUNTER — Other Ambulatory Visit (HOSPITAL_COMMUNITY): Payer: Self-pay

## 2020-10-24 ENCOUNTER — Other Ambulatory Visit: Payer: Self-pay | Admitting: Hematology & Oncology

## 2020-10-24 DIAGNOSIS — D751 Secondary polycythemia: Secondary | ICD-10-CM

## 2020-10-24 MED ORDER — RUXOLITINIB PHOSPHATE 10 MG PO TABS
ORAL_TABLET | Freq: Two times a day (BID) | ORAL | 4 refills | Status: DC
  Filled 2020-10-24: qty 60, 30d supply, fill #0
  Filled 2020-11-21: qty 60, 30d supply, fill #1
  Filled 2020-12-29: qty 60, 30d supply, fill #2
  Filled 2021-01-24: qty 60, 30d supply, fill #3
  Filled 2021-02-24: qty 60, 30d supply, fill #4

## 2020-10-24 NOTE — Telephone Encounter (Signed)
Oral Oncology Patient Advocate Encounter   Received notification from [facility] that the existing prior authorization for Shanon Brow is due for renewal.   Renewal PA submitted through [place] portal. Case # [id number] Status is pending   [facility] will continue to follow.  [place] Patient [place] Phone [phone] Fax [phone] 10/24/2020 12:26 PM

## 2020-10-27 ENCOUNTER — Ambulatory Visit: Payer: Managed Care, Other (non HMO) | Admitting: Cardiology

## 2020-10-27 ENCOUNTER — Other Ambulatory Visit: Payer: Self-pay

## 2020-10-27 ENCOUNTER — Encounter: Payer: Self-pay | Admitting: Cardiology

## 2020-10-27 ENCOUNTER — Other Ambulatory Visit (HOSPITAL_COMMUNITY): Payer: Self-pay

## 2020-10-27 VITALS — BP 138/84 | HR 60 | Ht 71.5 in | Wt 205.0 lb

## 2020-10-27 DIAGNOSIS — I10 Essential (primary) hypertension: Secondary | ICD-10-CM | POA: Diagnosis not present

## 2020-10-27 DIAGNOSIS — E7800 Pure hypercholesterolemia, unspecified: Secondary | ICD-10-CM | POA: Diagnosis not present

## 2020-10-27 DIAGNOSIS — I480 Paroxysmal atrial fibrillation: Secondary | ICD-10-CM | POA: Diagnosis not present

## 2020-10-27 MED ORDER — LOSARTAN POTASSIUM 100 MG PO TABS: 100.0000 mg | ORAL_TABLET | Freq: Every day | ORAL | 3 refills | Status: DC

## 2020-10-27 MED ORDER — ROSUVASTATIN CALCIUM 20 MG PO TABS: 20.0000 mg | ORAL_TABLET | Freq: Every day | ORAL | 3 refills | Status: DC

## 2020-10-27 NOTE — Patient Instructions (Signed)
Medication Instructions:   INCREASE LOSARTAN TO 100 MG ONCE DAILY= 2 OF THE 50 MG TABLETS ONCE DAILY  *If you need a refill on your cardiac medications before your next appointment, please call your pharmacy*  Follow-Up: At [facility], you and your health needs are our priority.  As part of our continuing mission to provide you with exceptional heart care, we have created designated Provider Care Teams.  These Care Teams include your primary Cardiologist (physician) and Advanced Practice Providers (APPs -  Physician Assistants and Nurse Practitioners) who all work together to provide you with the care you need, when you need it.  We recommend signing up for the patient portal called "MyChart".  Sign up information is provided on this After Visit Summary.  MyChart is used to connect with patients for Virtual Visits (Telemedicine).  Patients are able to view lab/test results, encounter notes, upcoming appointments, etc.  Non-urgent messages can be sent to your provider as well.   To learn more about what you can do with MyChart, go to [email or web address].    Your next appointment:   12 month(s)  The format for your next appointment:   In Person  Provider:   Kirk Ruths, MD

## 2020-11-01 NOTE — Telephone Encounter (Signed)
Oral Oncology Patient Advocate Encounter  Prior Authorization for Shanon Brow has been approved.    PA# [id number] Effective dates: 10/27/20 through 10/24/21  Oral Oncology Clinic will continue to follow.   [place] Patient [place] Phone [phone] Fax [phone] 11/01/2020 1:35 PM

## 2020-11-03 ENCOUNTER — Other Ambulatory Visit: Payer: Self-pay

## 2020-11-03 ENCOUNTER — Inpatient Hospital Stay: Payer: Managed Care, Other (non HMO) | Admitting: Hematology & Oncology

## 2020-11-03 ENCOUNTER — Encounter: Payer: Self-pay | Admitting: Hematology & Oncology

## 2020-11-03 ENCOUNTER — Inpatient Hospital Stay: Payer: Managed Care, Other (non HMO) | Attending: Hematology & Oncology

## 2020-11-03 ENCOUNTER — Telehealth: Payer: Self-pay

## 2020-11-03 VITALS — BP 125/73 | HR 59 | Temp 98.3°F | Resp 18 | Wt 210.0 lb

## 2020-11-03 DIAGNOSIS — D751 Secondary polycythemia: Secondary | ICD-10-CM | POA: Diagnosis not present

## 2020-11-03 DIAGNOSIS — Z79899 Other long term (current) drug therapy: Secondary | ICD-10-CM | POA: Diagnosis not present

## 2020-11-03 DIAGNOSIS — Z7982 Long term (current) use of aspirin: Secondary | ICD-10-CM | POA: Insufficient documentation

## 2020-11-03 DIAGNOSIS — R519 Headache, unspecified: Secondary | ICD-10-CM | POA: Insufficient documentation

## 2020-11-03 DIAGNOSIS — Z7901 Long term (current) use of anticoagulants: Secondary | ICD-10-CM | POA: Insufficient documentation

## 2020-11-03 DIAGNOSIS — D45 Polycythemia vera: Secondary | ICD-10-CM | POA: Insufficient documentation

## 2020-11-03 LAB — CMP (CANCER CENTER ONLY)
ALT: 31 U/L (ref 0–44)
AST: 34 U/L (ref 15–41)
Albumin: 4.9 g/dL (ref 3.5–5.0)
Alkaline Phosphatase: 43 U/L (ref 38–126)
Anion gap: 7 (ref 5–15)
BUN: 26 mg/dL — ABNORMAL HIGH (ref 8–23)
CO2: 27 mmol/L (ref 22–32)
Calcium: 9.5 mg/dL (ref 8.9–10.3)
Chloride: 102 mmol/L (ref 98–111)
Creatinine: 0.93 mg/dL (ref 0.61–1.24)
GFR, Estimated: >60 mL/min (ref >60)
Glucose, Bld: 87 mg/dL (ref 70–99)
Potassium: 3.7 mmol/L (ref 3.5–5.1)
Sodium: 136 mmol/L (ref 135–145)
Total Bilirubin: 1 mg/dL (ref 0.3–1.2)
Total Protein: 6.4 g/dL — ABNORMAL LOW (ref 6.5–8.1)

## 2020-11-03 LAB — CBC WITH DIFFERENTIAL (CANCER CENTER ONLY)
Abs Immature Granulocytes: 0.3 10*3/uL — ABNORMAL HIGH (ref 0.00–0.07)
Basophils Absolute: 0.1 10*3/uL (ref 0.0–0.1)
Basophils Relative: 1 %
Eosinophils Absolute: 0.3 10*3/uL (ref 0.0–0.5)
Eosinophils Relative: 3 %
HCT: 35.8 % — ABNORMAL LOW (ref 39.0–52.0)
Hemoglobin: 12.2 g/dL — ABNORMAL LOW (ref 13.0–17.0)
Immature Granulocytes: 3 %
Lymphocytes Relative: 13 %
Lymphs Abs: 1.3 10*3/uL (ref 0.7–4.0)
MCH: 30 pg (ref 26.0–34.0)
MCHC: 34.1 g/dL (ref 30.0–36.0)
MCV: 88.2 fL (ref 80.0–100.0)
Monocytes Absolute: 0.8 10*3/uL (ref 0.1–1.0)
Monocytes Relative: 8 %
Neutro Abs: 7.1 10*3/uL (ref 1.7–7.7)
Neutrophils Relative %: 72 %
Platelet Count: 310 10*3/uL (ref 150–400)
RBC: 4.06 MIL/uL — ABNORMAL LOW (ref 4.22–5.81)
RDW: 18.2 % — ABNORMAL HIGH (ref 11.5–15.5)
WBC Count: 9.9 10*3/uL (ref 4.0–10.5)
nRBC: 0.9 % — ABNORMAL HIGH (ref 0.0–0.2)

## 2020-11-03 LAB — LACTATE DEHYDROGENASE: LDH: 545 U/L — ABNORMAL HIGH (ref 98–192)

## 2020-11-03 NOTE — Progress Notes (Signed)
Hematology and Oncology Follow Up Visit  [name] [name] [id number] Mar 20, 1956 65 y.o. 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  Overall, he is doing okay.  He does complain of some headaches.  He has had a little bit of flushing.  I suppose this could be from the [facility].  We will continue to monitor this.  His blood pressure is doing quite well.  He had his blood pressure medicine increased.    He actually got back from [place].  He and his wife were up there visiting a daughter.  They had a nice time.  He has had no issues with cough.  There is no nausea or vomiting.  He has had no issues with bowels or bladder.  There is been no issues with rashes.  Overall, I would have to say that his performance status is ECOG 1.     Medications:  Allergies as of 11/03/2020       Reactions   Zoster Vaccine Recombinant, Adjuvanted Anaphylaxis, Other (See Comments)   03/28/2020 Patient reports only had fever.        Medication List        Accurate as of November 03, 2020  3:49 PM. If you have any questions, ask your nurse or doctor.          acetaminophen 500 MG tablet Commonly known as: TYLENOL Take 1,000 mg by mouth every 6 (six) hours as needed for mild pain.   aspirin 81 MG EC tablet Take 1 tablet (81 mg total) by mouth daily.   buPROPion 150 MG 12 hr tablet Commonly known as: WELLBUTRIN SR Take 150 mg by mouth every morning.   Eliquis 5 MG Tabs tablet Generic drug: apixaban TAKE 1 TABLET BY MOUTH TWICE A  DAY   fluorouracil 5 % cream Commonly known as: EFUDEX Apply 1 application topically 2 (two) times daily.   gabapentin 300 MG capsule Commonly known as: NEURONTIN TAKE 2 CAPSULES (600 MG TOTAL) BY MOUTH AT BEDTIME.   Jakafi 10 MG tablet Generic drug: ruxolitinib phosphate TAKE 1 TABLET BY MOUTH 2 TIMES DAILY.   losartan 100 MG tablet Commonly known as: COZAAR Take 1 tablet (100 mg total) by mouth daily.   melatonin 5 MG Tabs Take 5 mg by mouth.   Mens Multivitamin Tabs Take 1 tablet by mouth daily.   propranolol ER 160 MG SR capsule Commonly known as: INDERAL LA TAKE 1 CAPSULE BY MOUTH DAILY.   pseudoephedrine-acetaminophen 30-500 MG Tabs tablet Commonly known as: TYLENOL SINUS Take 1 tablet by mouth every 4 (four) hours as needed.   rosuvastatin 20 MG tablet Commonly known as: CRESTOR Take 1 tablet (20 mg total) by mouth daily.   zolpidem 12.5 MG CR tablet Commonly known as: AMBIEN CR Take 6.25 mg by mouth at bedtime.        Allergies:  Allergies  Allergen Reactions   Zoster Vaccine Recombinant, Adjuvanted Anaphylaxis and Other (See Comments)    03/28/2020 Patient reports only had fever.  Past Medical History, Surgical history, Social history, and Family History were reviewed and updated.  Review of Systems: Review of Systems  Constitutional: Negative.   HENT: Negative.    Eyes: Negative.   Respiratory: Negative.    Cardiovascular: Negative.   Gastrointestinal: Negative.   Genitourinary: Negative.   Musculoskeletal: Negative.   Skin:  Positive for rash.  Neurological: Negative.   Endo/Heme/Allergies: Negative.   Psychiatric/Behavioral: Negative.      Physical Exam:  weight is 210 lb (95.3 kg). His oral temperature is 98.3 F (36.8 C). His blood pressure is 125/73 and his pulse is 59 (abnormal). His respiration is 18 and oxygen saturation is 96%.   Wt Readings from Last 3 Encounters:  11/03/20 210 lb (95.3 kg)  10/27/20 205 lb (93 kg)   08/29/20 209 lb (94.8 kg)    Physical Exam Vitals reviewed.  HENT:     Head: Normocephalic and atraumatic.  Eyes:     Pupils: Pupils are equal, round, and reactive to light.  Cardiovascular:     Rate and Rhythm: Normal rate and regular rhythm.     Heart sounds: Normal heart sounds.  Pulmonary:     Effort: Pulmonary effort is normal.     Breath sounds: Normal breath sounds.  Abdominal:     General: Bowel sounds are normal.     Palpations: Abdomen is soft.     Comments: I cannot palpate his spleen.  Musculoskeletal:        General: No tenderness or deformity. Normal range of motion.     Cervical back: Normal range of motion.  Lymphadenopathy:     Cervical: No cervical adenopathy.  Skin:    General: Skin is warm and dry.     Findings: No erythema or rash.     Comments: He has the healing zoster rash in the left T7 dermatome.  There is some sensitivity to touch in this area.  Neurological:     Mental Status: He is alert and oriented to person, place, and time.  Psychiatric:        Behavior: Behavior normal.        Thought Content: Thought content normal.        Judgment: Judgment normal.     Lab Results  Component Value Date   WBC 9.9 11/03/2020   HGB 12.2 (L) 11/03/2020   HCT 35.8 (L) 11/03/2020   MCV 88.2 11/03/2020   PLT 310 11/03/2020   Lab Results  Component Value Date   FERRITIN 104 08/29/2020   IRON 153 08/29/2020   TIBC 384 08/29/2020   UIBC 230 08/29/2020   IRONPCTSAT 40 08/29/2020   Lab Results  Component Value Date   RETICCTPCT 3.2 (H) 12/04/2018   RBC 4.06 (L) 11/03/2020   RETICCTABS 87.6 02/21/2011   No results found for: KPAFRELGTCHN, LAMBDASER, KAPLAMBRATIO No results found for: IGGSERUM, IGA, IGMSERUM No results found for: Odetta Pink, SPEI   Chemistry      Component Value Date/Time   NA 136 11/03/2020 1435   NA 144 04/25/2017 1506   NA 139 04/12/2016 1113   K 3.7 11/03/2020 1435    K 4.2 04/25/2017 1506   K 4.1 04/12/2016 1113   CL 102 11/03/2020 1435   CL 105 04/25/2017 1506   CO2 27 11/03/2020 1435   CO2 29 04/25/2017 1506   CO2 22 04/12/2016 1113   BUN 26 (H) 11/03/2020 1435   BUN 17 04/25/2017 1506   BUN 16.3  04/12/2016 1113   CREATININE 0.93 11/03/2020 1435   CREATININE 1.2 04/25/2017 1506   CREATININE 0.9 04/12/2016 1113      Component Value Date/Time   CALCIUM 9.5 11/03/2020 1435   CALCIUM 9.5 04/25/2017 1506   CALCIUM 9.1 04/12/2016 1113   ALKPHOS 43 11/03/2020 1435   ALKPHOS 73 04/25/2017 1506   ALKPHOS 66 04/12/2016 1113   AST 34 11/03/2020 1435   AST 31 04/12/2016 1113   ALT 31 11/03/2020 1435   ALT 58 (H) 04/25/2017 1506   ALT 36 04/12/2016 1113   BILITOT 1.0 11/03/2020 1435   BILITOT 0.73 04/12/2016 1113       Impression and Plan: [name] [name] is a very pleasant 66 yo caucasian gentleman with polycythemia vera, JAK2 positive.   I think the Shanon Brow is doing quite well for him.  I am very happy that he has done well with the [facility].  Again I really cannot palpate his spleen.  I am sure that the headaches probably related to the [facility] to some degree.  I would like to see him back after Labor Day.  We will get a ultrasound of his spleen when we see him back.  If his blood work looks stable, and he still having issues with the occasional headaches, we may think about trying to decrease the Jakafi dose.  I am just happy that his quality of life is doing well.  [name], MD 6/30/20223:49 PM

## 2020-11-03 NOTE — Telephone Encounter (Signed)
Appts made per 11/03/20 los and pt is aware   Gordan Grell

## 2020-11-04 LAB — IRON AND TIBC
Iron: 144 ug/dL (ref 42–163)
Saturation Ratios: 36 % (ref 20–55)
TIBC: 398 ug/dL (ref 202–409)
UIBC: 254 ug/dL (ref 117–376)

## 2020-11-04 LAB — FERRITIN: Ferritin: 92 ng/mL (ref 24–336)

## 2020-11-21 ENCOUNTER — Other Ambulatory Visit (HOSPITAL_COMMUNITY): Payer: Self-pay

## 2020-11-28 ENCOUNTER — Other Ambulatory Visit (HOSPITAL_COMMUNITY): Payer: Self-pay

## 2020-11-29 ENCOUNTER — Ambulatory Visit: Payer: Managed Care, Other (non HMO) | Admitting: Neurology

## 2020-12-01 ENCOUNTER — Ambulatory Visit: Payer: Managed Care, Other (non HMO) | Admitting: Neurology

## 2020-12-01 ENCOUNTER — Other Ambulatory Visit: Payer: Self-pay

## 2020-12-01 ENCOUNTER — Encounter: Payer: Self-pay | Admitting: Neurology

## 2020-12-01 VITALS — BP 122/84 | HR 64 | Ht 71.5 in | Wt 206.4 lb

## 2020-12-01 DIAGNOSIS — G44221 Chronic tension-type headache, intractable: Secondary | ICD-10-CM

## 2020-12-01 DIAGNOSIS — R519 Headache, unspecified: Secondary | ICD-10-CM

## 2020-12-01 DIAGNOSIS — R202 Paresthesia of skin: Secondary | ICD-10-CM | POA: Diagnosis not present

## 2020-12-01 MED ORDER — TOPIRAMATE 25 MG PO TABS: 25.0000 mg | ORAL_TABLET | Freq: Two times a day (BID) | ORAL | 3 refills | Status: DC

## 2020-12-01 NOTE — Progress Notes (Signed)
[facility] [address] [place]. [place] [address] [phone]       OFFICE FOLLOW UP NOTE  [name] Date of Birth:  1955/05/19 Medical Record Number:  [id number]   Reason for Referral: stroke follow up   CHIEF COMPLAINT:  Chief Complaint  Patient presents with   History of stroke    Rm 1, 3 month FU  "same issues continue, most concerning is headaches that go from sharp pain to dull and move around on my head"    HPI:  Office Visit  08/22/2020,: [name] [name] returns for prolonged stroke follow-up after prior visit 11 months ago  Stable from stroke standpoint without new stroke/TIA symptoms Compliant on Eliquis and aspirin without associated side effects Compliant on atorvastatin without associated side effects Blood pressure today 142/85  He continues to have multiple chronic complaints including BUE and BLE burning type sensation, mild intermittent frontal headaches, occasional "odd sensation" lower face, short lasting flushed sensation and occasional "catch" in his speech.  All symptoms chronic without any being new or acute or worsening  EMG/NCV 10/13/2019 unremarkable - BUE and RLE only Does admit to chronic history of lower back pain with intermittent flareups with associated radiculopathy usually R>L.  Also has occasional neck pain with tightness sensation. Does report yesterday wife was rubbing his left hand due to pain and felt a shock type sensation on the left side of his neck.  Otherwise denies radiculopathy symptoms Burning type sensation typically only present in the evening especially while sitting in recliner.  He does admit to uncomfortable type sensation where he feels like he needs to move his legs and will improve after walking.  Burning type sensation usually only present in his toes.  He will take gabapentin 600 mg nightly - denies burning pains or uncomfortable sensation interfering with sleep.   HST 07/08/2019 showed mild OSA with  AHI 10.1/h and recommended use of CPAP.  He declined CPAP use and wanted to further look into dental device. He does report speaking with his dentist regarding dental device and is interested in pursuing further.  Chronic migraines usually frontal area with improvement after use of allergy medication. Headaches are not debilitating.  Denies associated nausea/vomiting, photophobia, phonophobia or visual changes. 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  He has been stable from a stroke standpoint without new or recurrent stroke/TIA symptoms.  He did undergo home sleep study which did show mild sleep apnea and recommended treatment of CPAP in light of his medical history of stroke and history of atrial fibrillation along with ongoing complaints of daytime fatigue.  CPAP not initiated at this time as he is questioning need of treatment especially as apnea mild.  Continues on Eliquis, aspirin and atorvastatin 40 mg daily for secondary stroke prevention.  Blood pressure today 142/76.  He continues to have complaints of bilateral lower extremity numbness/tingling with nerve pain greater at night as well as right hand numbness/tingling - has not underwent EMG/NCV that was previously ordered.  He has continued on gabapentin 300 mg daily with mild benefit and tolerating well.  He also endorses increased headaches with sinus issues.  No further concerns at this time.  12/29/2018 update JM: [name] [name] is a 65 year old male who is being seen today for 62-month follow-up visit.   He has been doing well from a stroke standpoint without residual deficits.  He does come in today with a long list of chronic complaints including tingling/burning sensation in feet bilaterally with tight/cramping sensation.  Tingling/numbness right hand, puffiness under left eye, odd feelings or sensations with at times lightheaded lasting for less than 1 minute and then resolve, occasional balance difficulties, occasional headaches, and decreased concentration/focus towards end of the day.  All symptoms have been present over the past several years and denies worsening with recent stroke.  He has not had prior work-up regarding numbness/tingling or nerve pain complaints.  He has not previously been on medication therapy for nerve pain.  Pain is typically worsened at night while he is sitting and relaxing.  He also endorses occasional cold sensation of lower extremities and right hand.  He does take Ambien 6.25 mg and melatonin 5 mg nightly due to insomnia.  He will occasionally wake up throughout the night with at times difficulty returning to sleep.  He does endorse daytime fatigue but does not typically stop to take a nap.  He has not previously underwent sleep study.  He continues on Eliquis 5 mg twice daily and aspirin half tab daily due to experiencing bright red blood with bowel movements and lasted for approximately 2 to 3 days approximately 3 weeks ago.  He stopped use of aspirin initially with resolution of symptoms and restarted half tab without recurrent symptoms.  Continues on aspirin from cardiac standpoint.  Continues on atorvastatin 40 mg daily.  Blood pressure today 120/69.  No further concerns at this time.  10/28/2018 virtual visit JM: he has been stable from a stroke standpoint without residual deficits  He has had 2 additional episodes of right hand and foot cold sensation which lasted approximately 1 hour -denies weakness, numbness or tingling Decreased energy level which is slowly  improving ?? Anxiety post Stroke- questions if he is having stroke symptoms or fear of having recurrent stroke Depression stable on Wellbutrin  Overall functioning well - returned to work and all other activities currently employed by [employer] as an Chief Financial Officer - initially difficulty concentrating but this has improved  Sleeping well at night - doesn't nap during the day Continues on Eliquis and aspirin $RemoveBef'81mg'KoSiiMITRu$  - no bleeding or bruising lipitor $RemoveBeforeDE'40mg'[id number]$  - does have chronic cramping of hands and feet but no worsening since start of lipitor Blood pressure - typically 120-130s/80s No further concerns at this time  Stroke admission 09/14/2018: [name] [name] is a 66 y.o. male with history of polycythemia, hypertension, PVCs, brief PAT and paroxysmal atrial fibrillation seen on monitor in February 2020-on Eliquis,   who presented with right upper and lower extremity paresthesias.  He did not receive IV t-PA due to anticoagulation and late presentation (>4.5 hours from time of onset).  Stroke work-up revealed subcentimeter acute/late subacute infarction within the cortex of the left superior precentral gyrus likely cardioembolic.  Recommended continuation of Eliquis and potentially adding aspirin 81 mg which was recommended to be decided by cardiology.  HTN stable.  LDL 76 and recommended atorvastatin 40 mg daily.  Other stroke risk factors include advanced age, EtOH use, history of stroke and atrial fibrillation.  He was discharged home in stable condition without therapy needs.   Update 12/01/2020:  He returns for follow-up after last visit with [name] [name] practitioner 3 months ago.  He continues to have intermittent tingling and numbness more involving his toes in the hands to lesser extent.  This is mostly constant in the left foot and he notices mostly at night when he is resting.  He is currently taking gabapentin 600 mg at night but does not find it to be effective.  Is also complaining of almost  daily headaches.  The headaches are variable and occur on the vertex will occasionally of the temples or the back of the head.  He will get occasional sharp transient headaches but most of the time its a constant dull headache which is bothersome but he can continue to work.  He takes Tylenol which takes the edge off and helped somewhat.  That he can last for hours.  There are no specific triggers.  He had EMG nerve conduction study done on 10/13/2019 which was normal.  He had lab work done at last visit on 08/22/2020 all of which was normal and included angiotensin-converting enzyme, final Lyme antibodies, Sjogren's antibodies, vitamin B12, ESR, HIV and TSH.  He remained stable from neurovascular standpoint without recurrent stroke or TIA symptoms.  He remains on Eliquis which is tolerating well without bruising or bleeding.  Blood pressures well controlled and today it is 122/84.  He remains on Crestor which is tolerating well without muscle aches and pains.   ROS:   14 system review of systems performed and negative with exception of headache, tingling, numbness, imbalance and all other systems negative I  PMH:  Past Medical History:  Diagnosis Date   Diverticulosis of colon (without mention of hemorrhage)    Hemorrhoids    Hypertension    IBS (irritable bowel syndrome)    Iron deficiency anemia due to chronic blood loss 09/26/2017   Irregular heartbeat    Nephrolithiasis    Other and unspecified hyperlipidemia    Perirectal fistula    Polycythemia, secondary    Stroke (HCC)    Stye    blocker duct right eye lid    PSH:  Past Surgical History:  Procedure Laterality Date   RETINAL DETACHMENT SURGERY     ROTATOR CUFF REPAIR     rt.   VASECTOMY      Social History:  Social History   Socioeconomic History   Marital status: Married    Spouse name: Mardene Celeste   Number of children: 2   Years of education: Not on file   Highest education level: Not on file  Occupational History    Occupation: Lobbyist: [employer]  Tobacco Use   Smoking status: Never   Smokeless tobacco: Never  Vaping Use   Vaping Use: Never used  Substance and Sexual Activity   Alcohol use: Yes    Alcohol/week: 0.0 standard drinks    Comment: Occasional   Drug use: No   Sexual activity: Not on file  Other Topics Concern   Not on file  Social History Narrative   Lives with wife   Social Determinants of Health   Financial Resource Strain: Not on file  Food Insecurity: Not on file  Transportation Needs: Not on file  Physical Activity: Not on file  Stress: Not on file  Social Connections: Not on file  Intimate Partner Violence: Not on file    Family History:  Family History  Problem Relation Age of Onset   Aortic aneurysm Father    Heart  attack Brother    Cerebral palsy Brother    Colon cancer Neg Hx    Esophageal cancer Neg Hx    Pancreatic cancer Neg Hx    Prostate cancer Neg Hx    Rectal cancer Neg Hx    Stomach cancer Neg Hx     Medications:   Current Outpatient Medications on File Prior to Visit  Medication Sig Dispense Refill   acetaminophen (TYLENOL) 500 MG tablet Take 1,000 mg by mouth every 6 (six) hours as needed for mild pain.      aspirin EC 81 MG EC tablet Take 1 tablet (81 mg total) by mouth daily. 60 tablet 0   buPROPion (WELLBUTRIN SR) 150 MG 12 hr tablet Take 150 mg by mouth every morning.  12   ELIQUIS 5 MG TABS tablet TAKE 1 TABLET BY MOUTH TWICE A DAY 180 tablet 1   fluorouracil (EFUDEX) 5 % cream Apply 1 application topically 2 (two) times daily.     losartan (COZAAR) 100 MG tablet Take 1 tablet (100 mg total) by mouth daily. 90 tablet 3   melatonin 5 MG TABS Take 5 mg by mouth.     Multiple Vitamins-Minerals (MENS MULTIVITAMIN) TABS Take 1 tablet by mouth daily.     propranolol ER (INDERAL LA) 160 MG SR capsule TAKE 1 CAPSULE BY MOUTH DAILY. 90 capsule 2   pseudoephedrine-acetaminophen (TYLENOL SINUS) 30-500 MG TABS tablet Take 1  tablet by mouth every 4 (four) hours as needed.     rosuvastatin (CRESTOR) 20 MG tablet Take 1 tablet (20 mg total) by mouth daily. 90 tablet 3   ruxolitinib phosphate (JAKAFI) 10 MG tablet TAKE 1 TABLET BY MOUTH 2 TIMES DAILY. 60 tablet 4   zolpidem (AMBIEN CR) 12.5 MG CR tablet Take 6.25 mg by mouth at bedtime.      No current facility-administered medications on file prior to visit.    Allergies:   Allergies  Allergen Reactions   Zoster Vaccine Recombinant, Adjuvanted Anaphylaxis and Other (See Comments)    03/28/2020 Patient reports only had fever.      Physical Exam  Today's Vitals   12/01/20 1555  BP: 122/84  Pulse: 64  Weight: 206 lb 6.4 oz (93.6 kg)  Height: 5' 11.5" (1.816 m)   Body mass index is 28.39 kg/m.  General: well developed, well nourished, pleasant middle-age Caucasian male, seated, in no evident distress Head: head normocephalic and atraumatic.   Neck: supple with no carotid or supraclavicular bruits Cardiovascular: regular rate and rhythm, no murmurs Musculoskeletal: no deformity Skin:  no rash/petichiae Vascular:  Normal pulses all extremities   Neurologic Exam Mental Status: Awake and fully alert. Fluent speech and language. Oriented to place and time. Recent and remote memory intact. Attention span, concentration and fund of knowledge appropriate. Mood and affect appropriate.  Cranial Nerves: Pupils equal, briskly reactive to light. Extraocular movements full without nystagmus. Visual fields full to confrontation. Hearing intact. Facial sensation intact. Face, tongue, palate moves normally and symmetrically.  Motor: Normal bulk and tone. Normal strength in all tested extremity muscles. Sensory.: intact to light touch in all extremities. Decreased vibratory LLE distal and LUE distal. Decreased pinprick sensation fingers bilaterally Coordination: Rapid alternating movements normal in all extremities. Finger-to-nose and heel-to-shin performed accurately  bilaterally. Gait and Station: Arises from chair without difficulty. Stance is normal. Gait demonstrates normal stride length and balance without use of assistive device  Reflexes: 1+ and symmetric. Toes downgoing.        ASSESSMENT:  [name] [name] is a 65 y.o. 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 Vascular risk factors include polycythemia, HTN, HLD, atrial fibrillation and prior stroke.  Stable from stroke standpoint without residual deficits.  He continues to experience bilateral lower extremity paresthesias and right hand numbness/tingling.  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  New complaints of almost daily headaches likely mixed tension headaches with vascular features.    PLAN: I had a long discussion with the patient with regards to his almost daily persistent headaches, chronic hand and feet paresthesias and possibly small fiber neuropathy and remote history of stroke and atrial fibrillation and answered questions.  I recommend we discontinue gabapentin as its not helping him and instead try Topamax 25 mg twice daily to help both with his headaches as well as paresthesias.  Check MRI scan of the brain to rule out any structural lesion.  Continue Eliquis for stroke prevention and maintain aggressive risk factor modification with strict control of hypertension with blood pressure goal below 130/90, lipids with LDL cholesterol goal below 70 mg percent and diabetes with hemoglobin A1c goal below 6.5%.  She will return for follow-up in the future in 3 months with my nurse practitioner Janett Billow or call earlier if necessary.  Greater than 50% time during this 25-minute visit was spent on counseling and coordination of care about his new onset daily headaches as well as paresthesias and history of stroke and answering questions  Antony Contras, MD [facility] [address] [place], [place] [address]  Phone [phone] Fax [phone] Note: This document was prepared with digital dictation and possible smart phrase technology. Any transcriptional errors that result from this process are unintentional.

## 2020-12-01 NOTE — Patient Instructions (Signed)
I had a long discussion with the patient with regards to his almost daily persistent headaches, chronic hand and feet paresthesias and possibly small fiber neuropathy and remote history of stroke and atrial fibrillation and answered questions.  I recommend we discontinue gabapentin as its not helping him and instead try Topamax 25 mg twice daily to help both with his headaches as well as paresthesias.  Check MRI scan of the brain to rule out any structural lesion.  Continue Eliquis for stroke prevention and maintain aggressive risk factor modification with strict control of hypertension with blood pressure goal below 130/90, lipids with LDL cholesterol goal below 70 mg percent and diabetes with hemoglobin A1c goal below 6.5%.  She will return for follow-up in the future in 3 months with my nurse practitioner Janett Billow or call earlier if necessary.

## 2020-12-05 ENCOUNTER — Telehealth: Payer: Self-pay | Admitting: Neurology

## 2020-12-05 NOTE — Telephone Encounter (Signed)
MRI brain w/wo contrast: sent to GI for scheduling- they obtain Novella Rob

## 2020-12-12 ENCOUNTER — Ambulatory Visit
Admission: RE | Admit: 2020-12-12 | Discharge: 2020-12-12 | Disposition: A | Discharge status: Home / Self Care | Payer: Managed Care, Other (non HMO) | Source: Ambulatory Visit | Attending: Neurology | Admitting: Neurology

## 2020-12-12 ENCOUNTER — Other Ambulatory Visit: Payer: Self-pay

## 2020-12-12 DIAGNOSIS — G44221 Chronic tension-type headache, intractable: Secondary | ICD-10-CM | POA: Diagnosis not present

## 2020-12-12 MED ORDER — GADOBENATE DIMEGLUMINE 529 MG/ML IV SOLN
19.0000 mL | Freq: Once | INTRAVENOUS | Status: AC | PRN
  Administered 2020-12-12: 19 mL via INTRAVENOUS

## 2020-12-14 ENCOUNTER — Telehealth: Payer: Self-pay | Admitting: Neurology

## 2020-12-14 NOTE — Telephone Encounter (Signed)
I called the patient. He verbalized understanding of the findings.

## 2020-12-14 NOTE — Telephone Encounter (Signed)
Please call patient, MRI of the brain showed no acute abnormality, compared to previous MRI scan in 2020, no significant change.    IMPRESSION: This MRI of the brain with and without contrast shows the following: 1.   Couple small T2/FLAIR hyperintense foci in the hemispheres consistent with minimal chronic microvascular ischemic change.  None of the foci appear to be acute.  The subacute focus noted on diffusion-weighted images in 2020 is not apparent on the current MRI. 2.   Brain volume was normal for age. 3.   No acute findings. 4.   Normal enhancement pattern. 5.   Focus in the lower lateral left globe, unchanged compared to the 2020 MRI.  Etiology is unclear.  This could represent sequela of prior surgery or trauma.

## 2020-12-19 ENCOUNTER — Other Ambulatory Visit (HOSPITAL_COMMUNITY): Payer: Self-pay

## 2020-12-20 ENCOUNTER — Encounter: Payer: Self-pay | Admitting: Adult Health

## 2020-12-21 MED ORDER — GABAPENTIN 300 MG PO CAPS: ORAL_CAPSULE | ORAL | 5 refills | Status: DC

## 2020-12-29 ENCOUNTER — Other Ambulatory Visit (HOSPITAL_COMMUNITY): Payer: Self-pay

## 2021-01-18 ENCOUNTER — Encounter: Payer: Self-pay | Admitting: Hematology & Oncology

## 2021-01-18 ENCOUNTER — Inpatient Hospital Stay: Payer: Managed Care, Other (non HMO) | Attending: Hematology & Oncology

## 2021-01-18 ENCOUNTER — Inpatient Hospital Stay: Payer: Managed Care, Other (non HMO) | Admitting: Hematology & Oncology

## 2021-01-18 ENCOUNTER — Ambulatory Visit (HOSPITAL_BASED_OUTPATIENT_CLINIC_OR_DEPARTMENT_OTHER)
Admission: RE | Admit: 2021-01-18 | Discharge: 2021-01-18 | Disposition: A | Discharge status: Home / Self Care | Payer: Managed Care, Other (non HMO) | Source: Ambulatory Visit | Attending: Hematology & Oncology | Admitting: Hematology & Oncology

## 2021-01-18 ENCOUNTER — Other Ambulatory Visit: Payer: Self-pay

## 2021-01-18 VITALS — BP 129/73 | HR 58 | Temp 98.6°F | Resp 16 | Wt 207.0 lb

## 2021-01-18 DIAGNOSIS — D751 Secondary polycythemia: Secondary | ICD-10-CM

## 2021-01-18 DIAGNOSIS — D500 Iron deficiency anemia secondary to blood loss (chronic): Secondary | ICD-10-CM | POA: Diagnosis not present

## 2021-01-18 LAB — CBC WITH DIFFERENTIAL (CANCER CENTER ONLY)
Abs Immature Granulocytes: 0.37 10*3/uL — ABNORMAL HIGH (ref 0.00–0.07)
Basophils Absolute: 0.1 10*3/uL (ref 0.0–0.1)
Basophils Relative: 1 %
Eosinophils Absolute: 0.4 10*3/uL (ref 0.0–0.5)
Eosinophils Relative: 4 %
HCT: 35.6 % — ABNORMAL LOW (ref 39.0–52.0)
Hemoglobin: 11.6 g/dL — ABNORMAL LOW (ref 13.0–17.0)
Immature Granulocytes: 4 %
Lymphocytes Relative: 13 %
Lymphs Abs: 1.2 10*3/uL (ref 0.7–4.0)
MCH: 29.4 pg (ref 26.0–34.0)
MCHC: 32.6 g/dL (ref 30.0–36.0)
MCV: 90.1 fL (ref 80.0–100.0)
Monocytes Absolute: 0.8 10*3/uL (ref 0.1–1.0)
Monocytes Relative: 9 %
Neutro Abs: 6.5 10*3/uL (ref 1.7–7.7)
Neutrophils Relative %: 69 %
Platelet Count: 307 10*3/uL (ref 150–400)
RBC: 3.95 MIL/uL — ABNORMAL LOW (ref 4.22–5.81)
RDW: 18.2 % — ABNORMAL HIGH (ref 11.5–15.5)
WBC Count: 9.3 10*3/uL (ref 4.0–10.5)
nRBC: 1.4 % — ABNORMAL HIGH (ref 0.0–0.2)

## 2021-01-18 LAB — CMP (CANCER CENTER ONLY)
ALT: 28 U/L (ref 0–44)
AST: 33 U/L (ref 15–41)
Albumin: 4.7 g/dL (ref 3.5–5.0)
Alkaline Phosphatase: 40 U/L (ref 38–126)
Anion gap: 8 (ref 5–15)
BUN: 17 mg/dL (ref 8–23)
CO2: 29 mmol/L (ref 22–32)
Calcium: 9.3 mg/dL (ref 8.9–10.3)
Chloride: 104 mmol/L (ref 98–111)
Creatinine: 0.99 mg/dL (ref 0.61–1.24)
GFR, Estimated: >60 mL/min (ref >60)
Glucose, Bld: 89 mg/dL (ref 70–99)
Potassium: 3.8 mmol/L (ref 3.5–5.1)
Sodium: 141 mmol/L (ref 135–145)
Total Bilirubin: 1.2 mg/dL (ref 0.3–1.2)
Total Protein: 6.2 g/dL — ABNORMAL LOW (ref 6.5–8.1)

## 2021-01-18 LAB — SAVE SMEAR(SSMR), FOR PROVIDER SLIDE REVIEW

## 2021-01-18 LAB — LACTATE DEHYDROGENASE: LDH: 456 U/L — ABNORMAL HIGH (ref 98–192)

## 2021-01-18 LAB — RETICULOCYTES
Immature Retic Fract: 25.4 % — ABNORMAL HIGH (ref 2.3–15.9)
RBC.: 3.93 MIL/uL — ABNORMAL LOW (ref 4.22–5.81)
Retic Count, Absolute: 143.1 10*3/uL (ref 19.0–186.0)
Retic Ct Pct: 3.6 % — ABNORMAL HIGH (ref 0.4–3.1)

## 2021-01-18 NOTE — Progress Notes (Signed)
Hematology and Oncology Follow Up Visit  [name] [name] [id number] [birth year] 65 y.o. 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  We saw him 3 months ago.  He has been doing well.  He is retired.  His wife still works.  They will be headed out to [facility] in a couple weeks.  They have a daughter who lives out there.  It sounds like the daughter and her husband will be moving somewhere.  Hopefully will be to the [facility].  He had an ultrasound of his abdomen today.  I do not have the results back to see how his spleen looks.  He is tolerated the Jakafi well.  Has been having some headaches.  He saw his neurologist.  He is placed on Topamax but this did not make him feel all that well.  He has had no problems with cough or shortness of breath.  There has been no nausea or vomiting.  He has had some urinary issues.  It sounds like there may be some prostatic hypertrophy.  He has had no rashes.  There has been no leg swelling.  When we last saw him in June, his iron studies showed a ferritin of 92 with an iron saturation of 36%.  Overall, [name] [name] would have to say his performance status is ECOG 1.     Medications:  Allergies as of 01/18/2021       Reactions   Zoster Vaccine Recombinant, Adjuvanted Anaphylaxis, Other (See Comments)   03/28/2020 Patient reports only had fever.        Medication List        Accurate as of January 18, 2021  4:04 PM. If you have any questions, ask your nurse or doctor.           acetaminophen 500 MG tablet Commonly known as: TYLENOL Take 1,000 mg by mouth every 6 (six) hours as needed for mild pain.   aspirin 81 MG EC tablet Take 1 tablet (81 mg total) by mouth daily.   buPROPion 150 MG 12 hr tablet Commonly known as: WELLBUTRIN SR Take 150 mg by mouth every morning.   Eliquis 5 MG Tabs tablet Generic drug: apixaban TAKE 1 TABLET BY MOUTH TWICE A DAY   fluorouracil 5 % cream Commonly known as: EFUDEX Apply 1 application topically 2 (two) times daily.   gabapentin 300 MG capsule Commonly known as: NEURONTIN Take 1 capsule (300 mg total) by mouth every morning AND 2 capsules (600 mg total) at bedtime.   Jakafi 10 MG tablet Generic drug: ruxolitinib phosphate TAKE 1 TABLET BY MOUTH 2 TIMES DAILY.   losartan 100 MG tablet Commonly known as: COZAAR Take 1 tablet (100 mg total) by mouth daily.   melatonin 5 MG Tabs Take 5 mg by mouth.   Mens Multivitamin Tabs Take 1 tablet by mouth daily.   propranolol ER 160 MG SR capsule Commonly known as: INDERAL LA TAKE 1 CAPSULE BY MOUTH DAILY.   pseudoephedrine-acetaminophen 30-500 MG Tabs tablet Commonly  known as: TYLENOL SINUS Take 1 tablet by mouth every 4 (four) hours as needed.   rosuvastatin 20 MG tablet Commonly known as: CRESTOR Take 1 tablet (20 mg total) by mouth daily.   zolpidem 12.5 MG CR tablet Commonly known as: AMBIEN CR Take 6.25 mg by mouth at bedtime.        Allergies:  Allergies  Allergen Reactions   Zoster Vaccine Recombinant, Adjuvanted Anaphylaxis and Other (See Comments)    03/28/2020 Patient reports only had fever.     Past Medical History, Surgical history, Social history, and Family History were reviewed and updated.  Review of Systems: Review of Systems  Constitutional: Negative.   HENT: Negative.    Eyes: Negative.   Respiratory: Negative.    Cardiovascular: Negative.   Gastrointestinal: Negative.   Genitourinary: Negative.   Musculoskeletal:  Negative.   Skin:  Positive for rash.  Neurological: Negative.   Endo/Heme/Allergies: Negative.   Psychiatric/Behavioral: Negative.      Physical Exam:  vitals were not taken for this visit.   Wt Readings from Last 3 Encounters:  12/01/20 206 lb 6.4 oz (93.6 kg)  11/03/20 210 lb (95.3 kg)  10/27/20 205 lb (93 kg)    Physical Exam Vitals reviewed.  HENT:     Head: Normocephalic and atraumatic.  Eyes:     Pupils: Pupils are equal, round, and reactive to light.  Cardiovascular:     Rate and Rhythm: Normal rate and regular rhythm.     Heart sounds: Normal heart sounds.  Pulmonary:     Effort: Pulmonary effort is normal.     Breath sounds: Normal breath sounds.  Abdominal:     General: Bowel sounds are normal.     Palpations: Abdomen is soft.     Comments: [name] [name] cannot palpate his spleen.  Musculoskeletal:        General: No tenderness or deformity. Normal range of motion.     Cervical back: Normal range of motion.  Lymphadenopathy:     Cervical: No cervical adenopathy.  Skin:    General: Skin is warm and dry.     Findings: No erythema or rash.     Comments: He has the healing zoster rash in the left T7 dermatome.  There is some sensitivity to touch in this area.  Neurological:     Mental Status: He is alert and oriented to person, place, and time.  Psychiatric:        Behavior: Behavior normal.        Thought Content: Thought content normal.        Judgment: Judgment normal.     Lab Results  Component Value Date   WBC 9.3 01/18/2021   HGB 11.6 (L) 01/18/2021   HCT 35.6 (L) 01/18/2021   MCV 90.1 01/18/2021   PLT 307 01/18/2021   Lab Results  Component Value Date   FERRITIN 92 11/03/2020   IRON 144 11/03/2020   TIBC 398 11/03/2020   UIBC 254 11/03/2020   IRONPCTSAT 36 11/03/2020   Lab Results  Component Value Date   RETICCTPCT 3.6 (H) 01/18/2021   RBC 3.93 (L) 01/18/2021   RETICCTABS 87.6 02/21/2011   No results found for: KPAFRELGTCHN, LAMBDASER,  KAPLAMBRATIO No results found for: IGGSERUM, IGA, IGMSERUM No results found for: Odetta Pink, SPEI   Chemistry      Component Value Date/Time   NA 141 01/18/2021 1507   NA 144 04/25/2017 1506   NA 139 04/12/2016 1113  K 3.8 01/18/2021 1507   K 4.2 04/25/2017 1506   K 4.1 04/12/2016 1113   CL 104 01/18/2021 1507   CL 105 04/25/2017 1506   CO2 29 01/18/2021 1507   CO2 29 04/25/2017 1506   CO2 22 04/12/2016 1113   BUN 17 01/18/2021 1507   BUN 17 04/25/2017 1506   BUN 16.3 04/12/2016 1113   CREATININE 0.99 01/18/2021 1507   CREATININE 1.2 04/25/2017 1506   CREATININE 0.9 04/12/2016 1113      Component Value Date/Time   CALCIUM 9.3 01/18/2021 1507   CALCIUM 9.5 04/25/2017 1506   CALCIUM 9.1 04/12/2016 1113   ALKPHOS 40 01/18/2021 1507   ALKPHOS 73 04/25/2017 1506   ALKPHOS 66 04/12/2016 1113   AST 33 01/18/2021 1507   AST 31 04/12/2016 1113   ALT 28 01/18/2021 1507   ALT 58 (H) 04/25/2017 1506   ALT 36 04/12/2016 1113   BILITOT 1.2 01/18/2021 1507   BILITOT 0.73 04/12/2016 1113       Impression and Plan: [name] [name] is a very pleasant 65 yo caucasian gentleman with polycythemia vera, JAK2 positive.   [name] [name] think the [name] [name] is doing quite well for him.  [name] [name] am very happy that he has done well with the [facility]  Again [name] [name] really cannot palpate his spleen.  Hopefully, we will find that the ultrasound will show that the spleen is certainly not larger.  Overall, [name] [name] really think everything is going quite nicely.  His blood counts are holding nice and steady.  We will plan for another follow-up in 3 months.  [name] [name] think this is a good follow-up interval.   [name], MD 9/14/20224:04 PM

## 2021-01-19 ENCOUNTER — Telehealth: Payer: Self-pay

## 2021-01-19 ENCOUNTER — Telehealth: Payer: Self-pay | Admitting: *Deleted

## 2021-01-19 LAB — IRON AND TIBC
Iron: 161 ug/dL (ref 42–163)
Saturation Ratios: 43 % (ref 20–55)
TIBC: 375 ug/dL (ref 202–409)
UIBC: 214 ug/dL (ref 117–376)

## 2021-01-19 LAB — FERRITIN: Ferritin: 93 ng/mL (ref 24–336)

## 2021-01-19 NOTE — Telephone Encounter (Signed)
Called and informed patient of results, patient verbalized understanding and denies any questions or concerns at this time.  ? ?

## 2021-01-19 NOTE — Telephone Encounter (Signed)
-----   Message from [name], MD sent at 01/19/2021  6:30 AM EDT ----- Call - the spleen is slightly larger.  By criteria, it is still only mildly enlarged.  [name] [name]

## 2021-01-19 NOTE — Telephone Encounter (Signed)
Per 01/18/21 los called and lvm of upcoming appointments - requested callback to confirm - mailed calendar

## 2021-01-24 ENCOUNTER — Other Ambulatory Visit (HOSPITAL_COMMUNITY): Payer: Self-pay

## 2021-01-30 ENCOUNTER — Other Ambulatory Visit (HOSPITAL_COMMUNITY): Payer: Self-pay

## 2021-02-22 ENCOUNTER — Other Ambulatory Visit (HOSPITAL_COMMUNITY): Payer: Self-pay

## 2021-02-24 ENCOUNTER — Other Ambulatory Visit (HOSPITAL_COMMUNITY): Payer: Self-pay

## 2021-03-01 ENCOUNTER — Other Ambulatory Visit (HOSPITAL_COMMUNITY): Payer: Self-pay

## 2021-03-06 ENCOUNTER — Encounter: Payer: Self-pay | Admitting: Adult Health

## 2021-03-06 ENCOUNTER — Ambulatory Visit: Payer: Managed Care, Other (non HMO) | Admitting: Adult Health

## 2021-03-06 VITALS — BP 134/80 | HR 56 | Ht 71.5 in | Wt 211.0 lb

## 2021-03-06 DIAGNOSIS — Z8673 Personal history of transient ischemic attack (TIA), and cerebral infarction without residual deficits: Secondary | ICD-10-CM | POA: Diagnosis not present

## 2021-03-06 DIAGNOSIS — M542 Cervicalgia: Secondary | ICD-10-CM

## 2021-03-06 DIAGNOSIS — R202 Paresthesia of skin: Secondary | ICD-10-CM | POA: Diagnosis not present

## 2021-03-06 DIAGNOSIS — R519 Headache, unspecified: Secondary | ICD-10-CM | POA: Diagnosis not present

## 2021-03-06 DIAGNOSIS — G4730 Sleep apnea, unspecified: Secondary | ICD-10-CM | POA: Diagnosis not present

## 2021-03-06 DIAGNOSIS — M5416 Radiculopathy, lumbar region: Secondary | ICD-10-CM

## 2021-03-06 MED ORDER — GABAPENTIN 300 MG PO CAPS: ORAL_CAPSULE | ORAL | 5 refills | Status: DC

## 2021-03-06 NOTE — Patient Instructions (Signed)
You will be called to obtain cervical and lumbar imaging to rule out underlying issues contributing to continued nerve pain  Increase gabapentin to 300mg  AM and afternoon and continue 600mg  nightly  Please ensure you complete a headache diary to better monitor headaches and potentially find triggers   Continue aspirin 81 mg daily and Eliquis (apixaban) daily  and Crestor  for secondary stroke prevention  Continue to follow up with PCP regarding cholesterol and blood pressure management  Maintain strict control of hypertension with blood pressure goal below 130/90 and cholesterol with LDL cholesterol (bad cholesterol) goal below 70 mg/dL.       Followup in the future with me in 6 months or call earlier if needed       Thank you for coming to see [place] at [facility]. I hope we have been able to provide you high quality care today.  You may receive a patient satisfaction survey over the next few weeks. We would appreciate your feedback and comments so that we may continue to improve ourselves and the health of our patients.
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
## 2021-03-15 ENCOUNTER — Telehealth: Payer: Self-pay | Admitting: Adult Health

## 2021-03-15 NOTE — Telephone Encounter (Signed)
Novella Rob: [id number] (exp. 03/08/21 to 09/04/21) patient is scheduled at GI for 03/26/21.

## 2021-03-26 ENCOUNTER — Ambulatory Visit
Admission: RE | Admit: 2021-03-26 | Discharge: 2021-03-26 | Disposition: A | Discharge status: Home / Self Care | Payer: Managed Care, Other (non HMO) | Source: Ambulatory Visit | Attending: Adult Health | Admitting: Adult Health

## 2021-03-26 ENCOUNTER — Other Ambulatory Visit: Payer: Self-pay

## 2021-03-26 DIAGNOSIS — R202 Paresthesia of skin: Secondary | ICD-10-CM | POA: Diagnosis not present

## 2021-03-26 DIAGNOSIS — M5416 Radiculopathy, lumbar region: Secondary | ICD-10-CM | POA: Diagnosis not present

## 2021-03-26 DIAGNOSIS — M542 Cervicalgia: Secondary | ICD-10-CM | POA: Diagnosis not present

## 2021-03-27 ENCOUNTER — Other Ambulatory Visit: Payer: Self-pay | Admitting: Hematology & Oncology

## 2021-03-27 ENCOUNTER — Other Ambulatory Visit: Payer: Self-pay | Admitting: Adult Health

## 2021-03-27 ENCOUNTER — Other Ambulatory Visit (HOSPITAL_COMMUNITY): Payer: Self-pay

## 2021-03-27 ENCOUNTER — Telehealth: Payer: Self-pay | Admitting: *Deleted

## 2021-03-27 DIAGNOSIS — G542 Cervical root disorders, not elsewhere classified: Secondary | ICD-10-CM

## 2021-03-27 DIAGNOSIS — D751 Secondary polycythemia: Secondary | ICD-10-CM

## 2021-03-27 DIAGNOSIS — M542 Cervicalgia: Secondary | ICD-10-CM

## 2021-03-27 DIAGNOSIS — M5416 Radiculopathy, lumbar region: Secondary | ICD-10-CM

## 2021-03-27 MED ORDER — RUXOLITINIB PHOSPHATE 10 MG PO TABS
ORAL_TABLET | Freq: Two times a day (BID) | ORAL | 4 refills | Status: DC
  Filled 2021-03-27: qty 60, 30d supply, fill #0
  Filled 2021-04-27: qty 60, 30d supply, fill #1
  Filled 2021-05-26: qty 60, 30d supply, fill #2
  Filled 2021-06-21: qty 60, 30d supply, fill #3
  Filled 2021-07-24: qty 60, 30d supply, fill #4

## 2021-03-27 NOTE — Telephone Encounter (Signed)
Spoke with patient and informed him that recent imaging did show potential nerve root compression in both cervical and lumbar imaging . She  will place a referral to neurosurgery for further discussion or possible treatment options if needed. I gave him # for [facility]. Patient verbalized understanding, appreciation.

## 2021-03-28 ENCOUNTER — Other Ambulatory Visit (HOSPITAL_COMMUNITY): Payer: Self-pay

## 2021-03-28 NOTE — Telephone Encounter (Signed)
Referral has been sent to [facility]. Phone: [phone].

## 2021-03-31 ENCOUNTER — Other Ambulatory Visit (HOSPITAL_COMMUNITY): Payer: Self-pay

## 2021-04-18 ENCOUNTER — Other Ambulatory Visit: Payer: Self-pay | Admitting: Adult Health

## 2021-04-19 ENCOUNTER — Inpatient Hospital Stay: Payer: Managed Care, Other (non HMO) | Admitting: Hematology & Oncology

## 2021-04-19 ENCOUNTER — Encounter: Payer: Self-pay | Admitting: Hematology & Oncology

## 2021-04-19 ENCOUNTER — Inpatient Hospital Stay: Payer: Managed Care, Other (non HMO) | Attending: Hematology & Oncology

## 2021-04-19 ENCOUNTER — Other Ambulatory Visit: Payer: Self-pay

## 2021-04-19 VITALS — BP 99/66 | HR 57 | Temp 98.5°F | Resp 18 | Wt 205.0 lb

## 2021-04-19 DIAGNOSIS — D751 Secondary polycythemia: Secondary | ICD-10-CM | POA: Diagnosis not present

## 2021-04-19 DIAGNOSIS — Z7982 Long term (current) use of aspirin: Secondary | ICD-10-CM | POA: Insufficient documentation

## 2021-04-19 DIAGNOSIS — R351 Nocturia: Secondary | ICD-10-CM | POA: Diagnosis not present

## 2021-04-19 DIAGNOSIS — G629 Polyneuropathy, unspecified: Secondary | ICD-10-CM | POA: Diagnosis not present

## 2021-04-19 DIAGNOSIS — Z7901 Long term (current) use of anticoagulants: Secondary | ICD-10-CM | POA: Insufficient documentation

## 2021-04-19 DIAGNOSIS — D45 Polycythemia vera: Secondary | ICD-10-CM | POA: Insufficient documentation

## 2021-04-19 DIAGNOSIS — D500 Iron deficiency anemia secondary to blood loss (chronic): Secondary | ICD-10-CM

## 2021-04-19 LAB — IRON AND TIBC
Iron: 141 ug/dL (ref 42–163)
Saturation Ratios: 37 % (ref 20–55)
TIBC: 383 ug/dL (ref 202–409)
UIBC: 242 ug/dL (ref 117–376)

## 2021-04-19 LAB — CBC WITH DIFFERENTIAL (CANCER CENTER ONLY)
Abs Immature Granulocytes: 0.3 10*3/uL — ABNORMAL HIGH (ref 0.00–0.07)
Basophils Absolute: 0.2 10*3/uL — ABNORMAL HIGH (ref 0.0–0.1)
Basophils Relative: 2 %
Eosinophils Absolute: 0.4 10*3/uL (ref 0.0–0.5)
Eosinophils Relative: 4 %
HCT: 37.1 % — ABNORMAL LOW (ref 39.0–52.0)
Hemoglobin: 12.3 g/dL — ABNORMAL LOW (ref 13.0–17.0)
Immature Granulocytes: 3 %
Lymphocytes Relative: 15 %
Lymphs Abs: 1.6 10*3/uL (ref 0.7–4.0)
MCH: 30.1 pg (ref 26.0–34.0)
MCHC: 33.2 g/dL (ref 30.0–36.0)
MCV: 90.7 fL (ref 80.0–100.0)
Monocytes Absolute: 1.1 10*3/uL — ABNORMAL HIGH (ref 0.1–1.0)
Monocytes Relative: 10 %
Neutro Abs: 6.8 10*3/uL (ref 1.7–7.7)
Neutrophils Relative %: 66 %
Platelet Count: 383 10*3/uL (ref 150–400)
RBC: 4.09 MIL/uL — ABNORMAL LOW (ref 4.22–5.81)
RDW: 18.8 % — ABNORMAL HIGH (ref 11.5–15.5)
WBC Count: 10.3 10*3/uL (ref 4.0–10.5)
nRBC: 1.1 % — ABNORMAL HIGH (ref 0.0–0.2)

## 2021-04-19 LAB — LACTATE DEHYDROGENASE: LDH: 543 U/L — ABNORMAL HIGH (ref 98–192)

## 2021-04-19 LAB — CMP (CANCER CENTER ONLY)
ALT: 34 U/L (ref 0–44)
AST: 35 U/L (ref 15–41)
Albumin: 4.8 g/dL (ref 3.5–5.0)
Alkaline Phosphatase: 47 U/L (ref 38–126)
Anion gap: 6 (ref 5–15)
BUN: 19 mg/dL (ref 8–23)
CO2: 31 mmol/L (ref 22–32)
Calcium: 9.6 mg/dL (ref 8.9–10.3)
Chloride: 107 mmol/L (ref 98–111)
Creatinine: 1.1 mg/dL (ref 0.61–1.24)
GFR, Estimated: >60 mL/min (ref >60)
Glucose, Bld: 93 mg/dL (ref 70–99)
Potassium: 4.2 mmol/L (ref 3.5–5.1)
Sodium: 144 mmol/L (ref 135–145)
Total Bilirubin: 1 mg/dL (ref 0.3–1.2)
Total Protein: 6.3 g/dL — ABNORMAL LOW (ref 6.5–8.1)

## 2021-04-19 LAB — SAVE SMEAR(SSMR), FOR PROVIDER SLIDE REVIEW

## 2021-04-19 LAB — FERRITIN: Ferritin: 123 ng/mL (ref 24–336)

## 2021-04-19 NOTE — Progress Notes (Signed)
Hematology and Oncology Follow Up Visit  [name] [name] [id number] 1955-06-12 65 y.o. 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  We saw him 3 months ago.  Today is his 44nd anniversary.  He and his wife spend the weekend [place] the [place] and had a wonderful time.  He is doing okay.  He has this neuropathy which is still bothering him.  He is on gabapentin for this.  I think he does have prostate issues.  He has some nocturia.  He says on occasion he has to go to the bathroom very quickly.  He is doing well on the [place].  He has had no problems with the [facility].  He is on aspirin and Eliquis.  This is because of the CVA that he had.  He has had no problems with fever.  He has had no problems with cough or shortness of breath.  His last splenic ultrasound was back in September.  Splenic size was 974 cm.  We are going to repeat this when we see him back in February.  He has had no cardiac issues.  I think he has occasional palpitations.  Overall, I would say that his performance status is ECOG 1.     Medications:  Allergies as of 04/19/2021       Reactions   Zoster Vaccine Recombinant, Adjuvanted Anaphylaxis, Other (See Comments)   03/28/2020 Patient reports only had fever.        Medication List        Accurate as of April 19, 2021 10:12 AM. If you have any questions, ask your nurse or doctor.          STOP taking these medications    valACYclovir 500 MG tablet Commonly known as:  VALTREX Stopped by: [name], MD       TAKE these medications    acetaminophen 500 MG tablet Commonly known as: TYLENOL Take 1,000 mg by mouth every 6 (six) hours as needed for mild pain.   aspirin 81 MG EC tablet Take 1 tablet (81 mg total) by mouth daily.   buPROPion 150 MG 12 hr tablet Commonly known as: WELLBUTRIN SR Take 150 mg by mouth every morning.   Eliquis 5 MG Tabs tablet Generic drug: apixaban TAKE 1 TABLET BY MOUTH TWICE A DAY   fluorouracil 5 % cream Commonly known as: EFUDEX Apply 1 application topically 2 (two) times daily.   gabapentin 300 MG capsule Commonly known as: NEURONTIN Take 1 capsule (300 mg total) by mouth 2 (two) times daily with breakfast and lunch AND 2 capsules (600 mg total) at bedtime.   Jakafi 10 MG tablet Generic drug: ruxolitinib phosphate TAKE 1 TABLET BY MOUTH 2 TIMES DAILY.   losartan 100 MG tablet Commonly known as: COZAAR Take 1 tablet (100 mg total) by mouth daily.   melatonin 5 MG Tabs Take 5 mg by mouth.   Mens Multivitamin Tabs Take 1 tablet by mouth daily.   propranolol ER 160 MG SR  capsule Commonly known as: INDERAL LA TAKE 1 CAPSULE BY MOUTH DAILY.   pseudoephedrine-acetaminophen 30-500 MG Tabs tablet Commonly known as: TYLENOL SINUS Take 1 tablet by mouth every 4 (four) hours as needed.   rosuvastatin 20 MG tablet Commonly known as: CRESTOR Take 1 tablet (20 mg total) by mouth daily.   zolpidem 12.5 MG CR tablet Commonly known as: AMBIEN CR Take 6.25 mg by mouth at bedtime.        Allergies:  Allergies  Allergen Reactions   Zoster Vaccine Recombinant, Adjuvanted Anaphylaxis and Other (See Comments)    03/28/2020 Patient reports only had fever.     Past Medical History, Surgical history, Social history, and Family History were reviewed and updated.  Review of Systems: Review of Systems  Constitutional: Negative.   HENT: Negative.    Eyes: Negative.   Respiratory: Negative.     Cardiovascular: Negative.   Gastrointestinal: Negative.   Genitourinary: Negative.   Musculoskeletal: Negative.   Skin:  Positive for rash.  Neurological: Negative.   Endo/Heme/Allergies: Negative.   Psychiatric/Behavioral: Negative.      Physical Exam:  weight is 205 lb (93 kg). His oral temperature is 98.5 F (36.9 C). His blood pressure is 99/66 and his pulse is 57 (abnormal). His respiration is 18 and oxygen saturation is 99%.   Wt Readings from Last 3 Encounters:  04/19/21 205 lb (93 kg)  03/06/21 211 lb (95.7 kg)  01/18/21 207 lb (93.9 kg)    Physical Exam Vitals reviewed.  HENT:     Head: Normocephalic and atraumatic.  Eyes:     Pupils: Pupils are equal, round, and reactive to light.  Cardiovascular:     Rate and Rhythm: Normal rate and regular rhythm.     Heart sounds: Normal heart sounds.  Pulmonary:     Effort: Pulmonary effort is normal.     Breath sounds: Normal breath sounds.  Abdominal:     General: Bowel sounds are normal.     Palpations: Abdomen is soft.     Comments: I cannot palpate his spleen.  Musculoskeletal:        General: No tenderness or deformity. Normal range of motion.     Cervical back: Normal range of motion.  Lymphadenopathy:     Cervical: No cervical adenopathy.  Skin:    General: Skin is warm and dry.     Findings: No erythema or rash.     Comments: He has the healing zoster rash in the left T7 dermatome.  There is some sensitivity to touch in this area.  Neurological:     Mental Status: He is alert and oriented to person, place, and time.  Psychiatric:        Behavior: Behavior normal.        Thought Content: Thought content normal.        Judgment: Judgment normal.     Lab Results  Component Value Date   WBC 10.3 04/19/2021   HGB 12.3 (L) 04/19/2021   HCT 37.1 (L) 04/19/2021   MCV 90.7 04/19/2021   PLT 383 04/19/2021   Lab Results  Component Value Date   FERRITIN 93 01/18/2021   IRON 161 01/18/2021   TIBC 375  01/18/2021   UIBC 214 01/18/2021   IRONPCTSAT 43 01/18/2021   Lab Results  Component Value Date   RETICCTPCT 3.6 (H) 01/18/2021   RBC 4.09 (L) 04/19/2021   RETICCTABS 87.6 02/21/2011   No results found for: KPAFRELGTCHN, LAMBDASER, KAPLAMBRATIO No results found for:  IGGSERUM, IGA, IGMSERUM No results found for: Odetta Pink, SPEI   Chemistry      Component Value Date/Time   NA 144 04/19/2021 0831   NA 144 04/25/2017 1506   NA 139 04/12/2016 1113   K 4.2 04/19/2021 0831   K 4.2 04/25/2017 1506   K 4.1 04/12/2016 1113   CL 107 04/19/2021 0831   CL 105 04/25/2017 1506   CO2 31 04/19/2021 0831   CO2 29 04/25/2017 1506   CO2 22 04/12/2016 1113   BUN 19 04/19/2021 0831   BUN 17 04/25/2017 1506   BUN 16.3 04/12/2016 1113   CREATININE 1.10 04/19/2021 0831   CREATININE 1.2 04/25/2017 1506   CREATININE 0.9 04/12/2016 1113      Component Value Date/Time   CALCIUM 9.6 04/19/2021 0831   CALCIUM 9.5 04/25/2017 1506   CALCIUM 9.1 04/12/2016 1113   ALKPHOS 47 04/19/2021 0831   ALKPHOS 73 04/25/2017 1506   ALKPHOS 66 04/12/2016 1113   AST 35 04/19/2021 0831   AST 31 04/12/2016 1113   ALT 34 04/19/2021 0831   ALT 58 (H) 04/25/2017 1506   ALT 36 04/12/2016 1113   BILITOT 1.0 04/19/2021 0831   BILITOT 0.73 04/12/2016 1113       Impression and Plan: [name] [name] is a very pleasant 65 yo caucasian gentleman with polycythemia vera, JAK2 positive.   I think the [place] is doing quite well for him.  I know that his platelet count has gone up a little bit.  It is still within normal range.  However, we really have to follow this closely.  I will see him back in late February.  We will do a splenic ultrasound the day that we see him back.  We can always increase the Jakafi dose given that he really does not anemic.    [name], MD 12/14/202210:12 AM

## 2021-04-27 ENCOUNTER — Other Ambulatory Visit (HOSPITAL_COMMUNITY): Payer: Self-pay

## 2021-05-02 ENCOUNTER — Other Ambulatory Visit (HOSPITAL_COMMUNITY): Payer: Self-pay

## 2021-05-18 ENCOUNTER — Other Ambulatory Visit (HOSPITAL_COMMUNITY): Payer: Self-pay

## 2021-05-26 ENCOUNTER — Other Ambulatory Visit (HOSPITAL_COMMUNITY): Payer: Self-pay

## 2021-05-29 ENCOUNTER — Other Ambulatory Visit (HOSPITAL_COMMUNITY): Payer: Self-pay

## 2021-06-21 ENCOUNTER — Other Ambulatory Visit (HOSPITAL_COMMUNITY): Payer: Self-pay

## 2021-06-26 ENCOUNTER — Ambulatory Visit (HOSPITAL_BASED_OUTPATIENT_CLINIC_OR_DEPARTMENT_OTHER)
Admission: RE | Admit: 2021-06-26 | Discharge: 2021-06-26 | Disposition: A | Discharge status: Home / Self Care | Payer: Managed Care, Other (non HMO) | Source: Ambulatory Visit | Attending: Hematology & Oncology | Admitting: Hematology & Oncology

## 2021-06-26 ENCOUNTER — Other Ambulatory Visit: Payer: Self-pay

## 2021-06-26 DIAGNOSIS — D751 Secondary polycythemia: Secondary | ICD-10-CM | POA: Insufficient documentation

## 2021-06-29 ENCOUNTER — Other Ambulatory Visit: Payer: Self-pay

## 2021-06-29 ENCOUNTER — Inpatient Hospital Stay: Payer: Managed Care, Other (non HMO) | Attending: Hematology & Oncology

## 2021-06-29 ENCOUNTER — Other Ambulatory Visit (HOSPITAL_COMMUNITY): Payer: Self-pay

## 2021-06-29 ENCOUNTER — Encounter: Payer: Self-pay | Admitting: Hematology & Oncology

## 2021-06-29 ENCOUNTER — Inpatient Hospital Stay: Payer: Managed Care, Other (non HMO) | Admitting: Hematology & Oncology

## 2021-06-29 VITALS — BP 107/62 | HR 57 | Temp 97.9°F | Resp 18 | Ht 71.5 in | Wt 213.1 lb

## 2021-06-29 DIAGNOSIS — D45 Polycythemia vera: Secondary | ICD-10-CM | POA: Diagnosis not present

## 2021-06-29 DIAGNOSIS — D751 Secondary polycythemia: Secondary | ICD-10-CM

## 2021-06-29 DIAGNOSIS — Z7901 Long term (current) use of anticoagulants: Secondary | ICD-10-CM | POA: Diagnosis not present

## 2021-06-29 LAB — CMP (CANCER CENTER ONLY)
ALT: 24 U/L (ref 0–44)
AST: 31 U/L (ref 15–41)
Albumin: 4.3 g/dL (ref 3.5–5.0)
Alkaline Phosphatase: 44 U/L (ref 38–126)
Anion gap: 6 (ref 5–15)
BUN: 18 mg/dL (ref 8–23)
CO2: 28 mmol/L (ref 22–32)
Calcium: 8.7 mg/dL — ABNORMAL LOW (ref 8.9–10.3)
Chloride: 106 mmol/L (ref 98–111)
Creatinine: 0.99 mg/dL (ref 0.61–1.24)
GFR, Estimated: >60 mL/min (ref >60)
Glucose, Bld: 94 mg/dL (ref 70–99)
Potassium: 4 mmol/L (ref 3.5–5.1)
Sodium: 140 mmol/L (ref 135–145)
Total Bilirubin: 1 mg/dL (ref 0.3–1.2)
Total Protein: 6.3 g/dL — ABNORMAL LOW (ref 6.5–8.1)

## 2021-06-29 LAB — CBC WITH DIFFERENTIAL (CANCER CENTER ONLY)
Abs Immature Granulocytes: 0.22 10*3/uL — ABNORMAL HIGH (ref 0.00–0.07)
Basophils Absolute: 0.2 10*3/uL — ABNORMAL HIGH (ref 0.0–0.1)
Basophils Relative: 2 %
Eosinophils Absolute: 0.4 10*3/uL (ref 0.0–0.5)
Eosinophils Relative: 5 %
HCT: 35.3 % — ABNORMAL LOW (ref 39.0–52.0)
Hemoglobin: 11.8 g/dL — ABNORMAL LOW (ref 13.0–17.0)
Immature Granulocytes: 3 %
Lymphocytes Relative: 15 %
Lymphs Abs: 1.2 10*3/uL (ref 0.7–4.0)
MCH: 29.2 pg (ref 26.0–34.0)
MCHC: 33.4 g/dL (ref 30.0–36.0)
MCV: 87.4 fL (ref 80.0–100.0)
Monocytes Absolute: 0.9 10*3/uL (ref 0.1–1.0)
Monocytes Relative: 11 %
Neutro Abs: 5.4 10*3/uL (ref 1.7–7.7)
Neutrophils Relative %: 64 %
Platelet Count: 286 10*3/uL (ref 150–400)
RBC: 4.04 MIL/uL — ABNORMAL LOW (ref 4.22–5.81)
RDW: 17.8 % — ABNORMAL HIGH (ref 11.5–15.5)
WBC Count: 8.3 10*3/uL (ref 4.0–10.5)
nRBC: 1.2 % — ABNORMAL HIGH (ref 0.0–0.2)

## 2021-06-29 LAB — SAVE SMEAR(SSMR), FOR PROVIDER SLIDE REVIEW

## 2021-06-29 LAB — LACTATE DEHYDROGENASE: LDH: 473 U/L — ABNORMAL HIGH (ref 98–192)

## 2021-06-29 NOTE — Progress Notes (Signed)
Hematology and Oncology Follow Up Visit  [name] [name] [id number] Sep 08, 1955 66 y.o. 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  We did do an ultrasound of his abdomen week or so ago.  This did seem to show an increase in his splenomegaly.  The splenic volume went up to 1115 cm.  Prior, it was 947 cm.  He was a bit worried because he did not think that a thorough exam was done.  He feels okay.  He does have aches and pains.  He does have a lot of arthralgias and myalgias.  I told him that he can take Advil or Aleve but he has to take this with food.  I know he is on Eliquis.  I do not think that the Eliquis will be that much of a problem with respect to bleeding.  He has had no problems with fever.  He was sick over Christmas.  His wife passed out right before Christmas.  She was dehydrated.  She is doing better now.  He has had no change in bowel or bladder habits.  He has had no rashes.  There is been no leg swelling.  He has had no palpitations.  Overall, I would say his performance status is ECOG 1.       Medications:  Allergies as of 06/29/2021       Reactions   Zoster Vaccine Recombinant, Adjuvanted Anaphylaxis, Other (See Comments)   03/28/2020 Patient reports only had fever.        Medication List        Accurate as of June 29, 2021  9:28 AM. If you have any questions, ask your nurse or doctor.          STOP taking these medications    buPROPion 150 MG 12 hr tablet Commonly known as:  WELLBUTRIN SR Stopped by: [name], MD   pseudoephedrine-acetaminophen 30-500 MG Tabs tablet Commonly known as: TYLENOL SINUS Stopped by: [name], MD       TAKE these medications    acetaminophen 500 MG tablet Commonly known as: TYLENOL Take 1,000 mg by mouth every 6 (six) hours as needed for mild pain.   aspirin 81 MG EC tablet Take 1 tablet (81 mg total) by mouth daily.   Eliquis 5 MG Tabs tablet Generic drug: apixaban TAKE 1 TABLET BY MOUTH TWICE A DAY   fluorouracil 5 % cream Commonly known as: EFUDEX Apply 1 application topically 2 (two) times daily.   gabapentin 300 MG capsule Commonly known as: NEURONTIN Take 1 capsule (300 mg total) by mouth 2 (two) times daily with breakfast and lunch AND 2 capsules (600 mg total) at bedtime.   Jakafi 10 MG tablet Generic drug: ruxolitinib phosphate TAKE 1 TABLET BY MOUTH 2 TIMES DAILY.   losartan 100 MG tablet Commonly known as: COZAAR Take 1 tablet (100 mg total) by mouth daily.   melatonin 5 MG Tabs Take 5 mg by mouth.   Mens Multivitamin Tabs Take 1  tablet by mouth daily.   propranolol ER 160 MG SR capsule Commonly known as: INDERAL LA TAKE 1 CAPSULE BY MOUTH DAILY.   rosuvastatin 20 MG tablet Commonly known as: CRESTOR Take 1 tablet (20 mg total) by mouth daily.   Tadalafil 2.5 MG Tabs Take 1 tablet by mouth daily as needed.   zolpidem 12.5 MG CR tablet Commonly known as: AMBIEN CR Take 6.25 mg by mouth at bedtime.        Allergies:  Allergies  Allergen Reactions   Zoster Vaccine Recombinant, Adjuvanted Anaphylaxis and Other (See Comments)    03/28/2020 Patient reports only had fever.     Past Medical History, Surgical history, Social history, and Family History were reviewed and updated.  Review of Systems: Review of Systems  Constitutional: Negative.   HENT: Negative.    Eyes: Negative.   Respiratory: Negative.    Cardiovascular: Negative.   Gastrointestinal: Negative.    Genitourinary: Negative.   Musculoskeletal: Negative.   Skin:  Positive for rash.  Neurological: Negative.   Endo/Heme/Allergies: Negative.   Psychiatric/Behavioral: Negative.      Physical Exam:  height is 5' 11.5" (1.816 m) and weight is 213 lb 1.9 oz (96.7 kg). His oral temperature is 97.9 F (36.6 C). His blood pressure is 107/62 and his pulse is 57 (abnormal). His respiration is 18 and oxygen saturation is 100%.   Wt Readings from Last 3 Encounters:  06/29/21 213 lb 1.9 oz (96.7 kg)  04/19/21 205 lb (93 kg)  03/06/21 211 lb (95.7 kg)    Physical Exam Vitals reviewed.  HENT:     Head: Normocephalic and atraumatic.  Eyes:     Pupils: Pupils are equal, round, and reactive to light.  Cardiovascular:     Rate and Rhythm: Normal rate and regular rhythm.     Heart sounds: Normal heart sounds.  Pulmonary:     Effort: Pulmonary effort is normal.     Breath sounds: Normal breath sounds.  Abdominal:     General: Bowel sounds are normal.     Palpations: Abdomen is soft.     Comments: I cannot palpate his spleen.  Musculoskeletal:        General: No tenderness or deformity. Normal range of motion.     Cervical back: Normal range of motion.  Lymphadenopathy:     Cervical: No cervical adenopathy.  Skin:    General: Skin is warm and dry.     Findings: No erythema or rash.     Comments: He has the healing zoster rash in the left T7 dermatome.  There is some sensitivity to touch in this area.  Neurological:     Mental Status: He is alert and oriented to person, place, and time.  Psychiatric:        Behavior: Behavior normal.        Thought Content: Thought content normal.        Judgment: Judgment normal.     Lab Results  Component Value Date   WBC 8.3 06/29/2021   HGB 11.8 (L) 06/29/2021   HCT 35.3 (L) 06/29/2021   MCV 87.4 06/29/2021   PLT 286 06/29/2021   Lab Results  Component Value Date   FERRITIN 123 04/19/2021   IRON 141 04/19/2021   TIBC 383 04/19/2021    UIBC 242 04/19/2021   IRONPCTSAT 37 04/19/2021   Lab Results  Component Value Date   RETICCTPCT 3.6 (H) 01/18/2021   RBC 4.04 (L) 06/29/2021   RETICCTABS 87.6 02/21/2011  No results found for: KPAFRELGTCHN, LAMBDASER, KAPLAMBRATIO No results found for: IGGSERUM, IGA, IGMSERUM No results found for: Kathrynn Ducking, MSPIKE, SPEI   Chemistry      Component Value Date/Time   NA 140 06/29/2021 0842   NA 144 04/25/2017 1506   NA 139 04/12/2016 1113   K 4.0 06/29/2021 0842   K 4.2 04/25/2017 1506   K 4.1 04/12/2016 1113   CL 106 06/29/2021 0842   CL 105 04/25/2017 1506   CO2 28 06/29/2021 0842   CO2 29 04/25/2017 1506   CO2 22 04/12/2016 1113   BUN 18 06/29/2021 0842   BUN 17 04/25/2017 1506   BUN 16.3 04/12/2016 1113   CREATININE 0.99 06/29/2021 0842   CREATININE 1.2 04/25/2017 1506   CREATININE 0.9 04/12/2016 1113      Component Value Date/Time   CALCIUM 8.7 (L) 06/29/2021 0842   CALCIUM 9.5 04/25/2017 1506   CALCIUM 9.1 04/12/2016 1113   ALKPHOS 44 06/29/2021 0842   ALKPHOS 73 04/25/2017 1506   ALKPHOS 66 04/12/2016 1113   AST 31 06/29/2021 0842   AST 31 04/12/2016 1113   ALT 24 06/29/2021 0842   ALT 58 (H) 04/25/2017 1506   ALT 36 04/12/2016 1113   BILITOT 1.0 06/29/2021 0842   BILITOT 0.73 04/12/2016 1113       Impression and Plan: Mr. Thrun is a very pleasant 66 yo caucasian gentleman with polycythemia vera, JAK2 positive.   I am not no change to Jakafi dose right now.  I still think we have to maintain where we are.  His blood counts look great.  I looked at his blood smear and did not see anything that looked suspicious for any type of progression or transformation.  I would like to do another ultrasound when we see him back in a couple months.  We will see him back in April.  If the ultrasound shows continued splenic enlargement, then we may have to adjust the Jakafi dose.   [name], MD 2/23/20239:28  AM

## 2021-06-30 ENCOUNTER — Encounter: Payer: Self-pay | Admitting: Adult Health

## 2021-06-30 DIAGNOSIS — G5603 Carpal tunnel syndrome, bilateral upper limbs: Secondary | ICD-10-CM

## 2021-07-10 NOTE — Telephone Encounter (Signed)
Patient continues to complain of hand and feet numbness.  Extensive work-up completed including EMG/NCV, spinal imaging and lab work which was largely unremarkable. Symptoms have been present even prior to his stroke. He does routinely follow with hematology for polycythemia vera - unsure if these symptoms could be related to polycythemia and possible erythromelalgia? He was seen by neurosurgery without further recommendations. Do you have any further recommendations from our standpoint?  ?

## 2021-07-11 ENCOUNTER — Telehealth: Payer: Self-pay | Admitting: Adult Health

## 2021-07-11 NOTE — Telephone Encounter (Signed)
Referral sent to [facility] [phone]. ?

## 2021-07-16 ENCOUNTER — Other Ambulatory Visit: Payer: Self-pay | Admitting: Cardiology

## 2021-07-17 ENCOUNTER — Other Ambulatory Visit (HOSPITAL_COMMUNITY): Payer: Self-pay

## 2021-07-17 NOTE — Telephone Encounter (Signed)
Prescription refill request for Eliquis received. ?Indication:Afib ?Last office visit:6/22 ?Scr:0.9 ?Age: 66 ?Weight:96.7 kg ? ?Prescription refilled ? ?

## 2021-07-24 ENCOUNTER — Other Ambulatory Visit (HOSPITAL_COMMUNITY): Payer: Self-pay

## 2021-07-24 ENCOUNTER — Encounter: Payer: Self-pay | Admitting: Hematology & Oncology

## 2021-07-24 ENCOUNTER — Encounter: Payer: Self-pay | Admitting: Cardiology

## 2021-07-27 ENCOUNTER — Other Ambulatory Visit (HOSPITAL_COMMUNITY): Payer: Self-pay

## 2021-08-14 ENCOUNTER — Other Ambulatory Visit: Payer: Self-pay | Admitting: Adult Health

## 2021-08-14 ENCOUNTER — Other Ambulatory Visit (HOSPITAL_COMMUNITY): Payer: Self-pay

## 2021-08-14 ENCOUNTER — Other Ambulatory Visit: Payer: Self-pay | Admitting: Cardiology

## 2021-08-14 DIAGNOSIS — I10 Essential (primary) hypertension: Secondary | ICD-10-CM

## 2021-08-14 DIAGNOSIS — R002 Palpitations: Secondary | ICD-10-CM

## 2021-08-16 ENCOUNTER — Encounter (INDEPENDENT_AMBULATORY_CARE_PROVIDER_SITE_OTHER): Payer: Self-pay

## 2021-08-17 ENCOUNTER — Other Ambulatory Visit: Payer: Self-pay | Admitting: Hematology & Oncology

## 2021-08-17 ENCOUNTER — Other Ambulatory Visit (HOSPITAL_COMMUNITY): Payer: Self-pay

## 2021-08-17 DIAGNOSIS — D751 Secondary polycythemia: Secondary | ICD-10-CM

## 2021-08-17 MED ORDER — RUXOLITINIB PHOSPHATE 10 MG PO TABS
ORAL_TABLET | Freq: Two times a day (BID) | ORAL | 4 refills | Status: DC
  Filled 2021-08-29: qty 60, 30d supply, fill #0
  Filled 2021-09-21: qty 60, 30d supply, fill #1
  Filled 2021-10-20: qty 60, 30d supply, fill #2
  Filled 2021-11-21: qty 60, 30d supply, fill #3
  Filled 2021-12-21: qty 60, 30d supply, fill #4

## 2021-08-23 ENCOUNTER — Encounter: Payer: Self-pay | Admitting: Hematology & Oncology

## 2021-08-23 ENCOUNTER — Other Ambulatory Visit: Payer: Self-pay

## 2021-08-23 ENCOUNTER — Inpatient Hospital Stay: Payer: Managed Care, Other (non HMO) | Admitting: Hematology & Oncology

## 2021-08-23 ENCOUNTER — Inpatient Hospital Stay: Payer: Managed Care, Other (non HMO) | Attending: Hematology & Oncology

## 2021-08-23 ENCOUNTER — Ambulatory Visit (HOSPITAL_BASED_OUTPATIENT_CLINIC_OR_DEPARTMENT_OTHER)
Admission: RE | Admit: 2021-08-23 | Discharge: 2021-08-23 | Disposition: A | Discharge status: Home / Self Care | Payer: Managed Care, Other (non HMO) | Source: Ambulatory Visit | Attending: Hematology & Oncology | Admitting: Hematology & Oncology

## 2021-08-23 ENCOUNTER — Inpatient Hospital Stay: Payer: Managed Care, Other (non HMO)

## 2021-08-23 ENCOUNTER — Ambulatory Visit: Payer: Managed Care, Other (non HMO) | Admitting: Hematology & Oncology

## 2021-08-23 VITALS — BP 134/74 | HR 51 | Temp 98.4°F | Resp 18 | Ht 71.0 in | Wt 206.0 lb

## 2021-08-23 DIAGNOSIS — D751 Secondary polycythemia: Secondary | ICD-10-CM | POA: Diagnosis not present

## 2021-08-23 LAB — CMP (CANCER CENTER ONLY)
ALT: 20 U/L (ref 0–44)
AST: 25 U/L (ref 15–41)
Albumin: 5 g/dL (ref 3.5–5.0)
Alkaline Phosphatase: 39 U/L (ref 38–126)
Anion gap: 7 (ref 5–15)
BUN: 18 mg/dL (ref 8–23)
CO2: 29 mmol/L (ref 22–32)
Calcium: 9.6 mg/dL (ref 8.9–10.3)
Chloride: 106 mmol/L (ref 98–111)
Creatinine: 0.98 mg/dL (ref 0.61–1.24)
GFR, Estimated: >60 mL/min (ref >60)
Glucose, Bld: 97 mg/dL (ref 70–99)
Potassium: 3.9 mmol/L (ref 3.5–5.1)
Sodium: 142 mmol/L (ref 135–145)
Total Bilirubin: 0.9 mg/dL (ref 0.3–1.2)
Total Protein: 6.9 g/dL (ref 6.5–8.1)

## 2021-08-23 LAB — CBC WITH DIFFERENTIAL (CANCER CENTER ONLY)
Abs Immature Granulocytes: 0.18 10*3/uL — ABNORMAL HIGH (ref 0.00–0.07)
Basophils Absolute: 0.1 10*3/uL (ref 0.0–0.1)
Basophils Relative: 1 %
Eosinophils Absolute: 0.3 10*3/uL (ref 0.0–0.5)
Eosinophils Relative: 3 %
HCT: 37 % — ABNORMAL LOW (ref 39.0–52.0)
Hemoglobin: 12.2 g/dL — ABNORMAL LOW (ref 13.0–17.0)
Immature Granulocytes: 2 %
Lymphocytes Relative: 9 %
Lymphs Abs: 0.9 10*3/uL (ref 0.7–4.0)
MCH: 28.8 pg (ref 26.0–34.0)
MCHC: 33 g/dL (ref 30.0–36.0)
MCV: 87.5 fL (ref 80.0–100.0)
Monocytes Absolute: 0.7 10*3/uL (ref 0.1–1.0)
Monocytes Relative: 8 %
Neutro Abs: 7.6 10*3/uL (ref 1.7–7.7)
Neutrophils Relative %: 77 %
Platelet Count: 331 10*3/uL (ref 150–400)
RBC: 4.23 MIL/uL (ref 4.22–5.81)
RDW: 17.9 % — ABNORMAL HIGH (ref 11.5–15.5)
WBC Count: 9.8 10*3/uL (ref 4.0–10.5)
nRBC: 0.5 % — ABNORMAL HIGH (ref 0.0–0.2)

## 2021-08-23 LAB — FERRITIN: Ferritin: 54 ng/mL (ref 24–336)

## 2021-08-23 LAB — SAVE SMEAR(SSMR), FOR PROVIDER SLIDE REVIEW

## 2021-08-23 LAB — IRON AND IRON BINDING CAPACITY (CC-WL,HP ONLY)
Iron: 132 ug/dL (ref 45–182)
Saturation Ratios: 30 % (ref 17.9–39.5)
TIBC: 441 ug/dL (ref 250–450)
UIBC: 309 ug/dL (ref 117–376)

## 2021-08-23 LAB — LACTATE DEHYDROGENASE: LDH: 431 U/L — ABNORMAL HIGH (ref 98–192)

## 2021-08-23 NOTE — Progress Notes (Signed)
?Hematology and Oncology Follow Up Visit ? ?[name] [name] ?[id number] ?01/04/1956 66 y.o. ?08/23/2021 ? ? 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  ?Past Therapy: ?Hydrea 1000 mg by mouth daily - d/c on 11/13/2017 ?  ?Current Therapy:  ?Jakifi 10 mg po BID -- started on 10/13/2018  ?Phlebotomy to maintain hematocrit below 45% ?Eliquis 5 mg po BID - started 07/2018 ?EC ASA 81 mg po q day  ?IV iron as indicated for iron deficiency and symptoms ?  ?Interim History:  [name] is here today for follow-up.  He is doing okay.  We did do a ultrasound of his abdomen today.  His spleen is small than it was before.  The splenic size was 810 cm?. ? ?He had nerve conduction studies done on his left arm.  It looks like he has a carpal tunnel.  He had injection for this yesterday. ? ?He is worried about the possibility of having cardiac amyloidosis.  He saw a commercial on TV for the cardiac amyloid caused by transthyretin protein.  He called his cardiologist about this.  I am not sure the cardiologist thinks about this.  I think would be very unlikely that he has 2 rare blood problems.  However, it certainly would be worthwhile checking into this. ? ?He has had no problems with bowels or bladder.  He has had no abdominal pain.  He has had some neuropathy in the feet. ? ?There is been no fever.  He has had no rashes. ? ?Overall, I would say his performance status is probably ECOG 1.   ? ?Medications:  ?Allergies as of 08/23/2021   ? ?   Reactions  ? Zoster Vaccine Recombinant, Adjuvanted Anaphylaxis, Other (See Comments)  ? 03/28/2020 ?Patient reports only had fever.  ? ?  ? ?  ?Medication List  ?  ? ?  ? Accurate as of August 23, 2021  9:53 AM. If you have any questions, ask your nurse or doctor.  ?  ?  ? ?  ? ?acetaminophen 500 MG tablet ?Commonly known as: TYLENOL ?Take 1,000 mg by mouth every 6 (six) hours as needed  for mild pain. ?  ?aspirin 81 MG EC tablet ?Take 1 tablet (81 mg total) by mouth daily. ?  ?Eliquis 5 MG Tabs tablet ?Generic drug: apixaban ?TAKE 1 TABLET BY MOUTH TWICE A DAY ?  ?fluorouracil 5 % cream ?Commonly known as: EFUDEX ?Apply 1 application topically 2 (two) times daily. ?  ?gabapentin 300 MG capsule ?Commonly known as: NEURONTIN ?TAKE 1 CAPSULE (300 MG TOTAL) BY MOUTH EVERY MORNING AND 2 CAPSULES (600 MG TOTAL) AT BEDTIME. ?  ?losartan 100 MG tablet ?Commonly known as: COZAAR ?TAKE 1 TABLET BY MOUTH EVERY DAY ?  ?melatonin 5 MG Tabs ?Take 5 mg by mouth. ?  ?Mens Multivitamin Tabs ?Take 1 tablet by mouth daily. ?  ?propranolol ER 160 MG SR capsule ?Commonly known as: INDERAL LA ?TAKE 1 CAPSULE BY MOUTH EVERY DAY ?  ?rosuvastatin 20 MG tablet ?Commonly known as: CRESTOR ?TAKE 1 TABLET BY MOUTH EVERY DAY ?  ?ruxolitinib phosphate 10 MG tablet ?Commonly known as: JAKAFI ?TAKE 1 TABLET BY MOUTH 2 TIMES DAILY. ?  ?Tadalafil 2.5 MG Tabs ?Take 1 tablet by mouth daily as needed. ?  ?zolpidem 12.5 MG CR tablet ?Commonly known as: AMBIEN CR ?Take 6.25 mg by mouth at bedtime. ?  ? ?  ? ? ?Allergies:  ?Allergies  ?  Allergen Reactions  ? Zoster Vaccine Recombinant, Adjuvanted Anaphylaxis and Other (See Comments)  ?  03/28/2020 ?Patient reports only had fever. ?  ? ? ?Past Medical History, Surgical history, Social history, and Family History were reviewed and updated. ? ?Review of Systems: ?Review of Systems  ?Constitutional: Negative.   ?HENT: Negative.    ?Eyes: Negative.   ?Respiratory: Negative.    ?Cardiovascular: Negative.   ?Gastrointestinal: Negative.   ?Genitourinary: Negative.   ?Musculoskeletal: Negative.   ?Skin:  Positive for rash.  ?Neurological: Negative.   ?Endo/Heme/Allergies: Negative.   ?Psychiatric/Behavioral: Negative.    ? ? ?Physical Exam: ? height is '5\' 11"'$  (1.803 m) and weight is 206 lb 0.6 oz (93.5 kg). His oral temperature is 98.4 ?F (36.9 ?C). His blood pressure is 134/74 and his pulse is 51  (abnormal). His respiration is 18 and oxygen saturation is 100%.  ? ?Wt Readings from Last 3 Encounters:  ?08/23/21 206 lb 0.6 oz (93.5 kg)  ?06/29/21 213 lb 1.9 oz (96.7 kg)  ?04/19/21 205 lb (93 kg)  ? ? ?Physical Exam ?Vitals reviewed.  ?HENT:  ?   Head: Normocephalic and atraumatic.  ?Eyes:  ?   Pupils: Pupils are equal, round, and reactive to light.  ?Cardiovascular:  ?   Rate and Rhythm: Normal rate and regular rhythm.  ?   Heart sounds: Normal heart sounds.  ?Pulmonary:  ?   Effort: Pulmonary effort is normal.  ?   Breath sounds: Normal breath sounds.  ?Abdominal:  ?   General: Bowel sounds are normal.  ?   Palpations: Abdomen is soft.  ?   Comments: I cannot palpate his spleen.  ?Musculoskeletal:     ?   General: No tenderness or deformity. Normal range of motion.  ?   Cervical back: Normal range of motion.  ?Lymphadenopathy:  ?   Cervical: No cervical adenopathy.  ?Skin: ?   General: Skin is warm and dry.  ?   Findings: No erythema or rash.  ?   Comments: He has the healing zoster rash in the left T7 dermatome.  There is some sensitivity to touch in this area.  ?Neurological:  ?   Mental Status: He is alert and oriented to person, place, and time.  ?Psychiatric:     ?   Behavior: Behavior normal.     ?   Thought Content: Thought content normal.     ?   Judgment: Judgment normal.  ? ? ? ?Lab Results  ?Component Value Date  ? WBC 9.8 08/23/2021  ? HGB 12.2 (L) 08/23/2021  ? HCT 37.0 (L) 08/23/2021  ? MCV 87.5 08/23/2021  ? PLT 331 08/23/2021  ? ?Lab Results  ?Component Value Date  ? FERRITIN 123 04/19/2021  ? IRON 141 04/19/2021  ? TIBC 383 04/19/2021  ? UIBC 242 04/19/2021  ? IRONPCTSAT 37 04/19/2021  ? ?Lab Results  ?Component Value Date  ? RETICCTPCT 3.6 (H) 01/18/2021  ? RBC 4.23 08/23/2021  ? RETICCTABS 87.6 02/21/2011  ? ?No results found for: KPAFRELGTCHN, LAMBDASER, KAPLAMBRATIO ?No results found for: IGGSERUM, IGA, IGMSERUM ?No results found for: TOTALPROTELP, ALBUMINELP, A1GS, A2GS, BETS, BETA2SER,  GAMS, MSPIKE, SPEI ?  Chemistry   ?   ?Component Value Date/Time  ? NA 142 08/23/2021 0839  ? NA 144 04/25/2017 1506  ? NA 139 04/12/2016 1113  ? K 3.9 08/23/2021 0839  ? K 4.2 04/25/2017 1506  ? K 4.1 04/12/2016 1113  ? CL 106 08/23/2021 0839  ? CL  105 04/25/2017 1506  ? CO2 29 08/23/2021 0839  ? CO2 29 04/25/2017 1506  ? CO2 22 04/12/2016 1113  ? BUN 18 08/23/2021 0839  ? BUN 17 04/25/2017 1506  ? BUN 16.3 04/12/2016 1113  ? CREATININE 0.98 08/23/2021 0839  ? CREATININE 1.2 04/25/2017 1506  ? CREATININE 0.9 04/12/2016 1113  ?    ?Component Value Date/Time  ? CALCIUM 9.6 08/23/2021 0839  ? CALCIUM 9.5 04/25/2017 1506  ? CALCIUM 9.1 04/12/2016 1113  ? ALKPHOS 39 08/23/2021 0839  ? ALKPHOS 73 04/25/2017 1506  ? ALKPHOS 66 04/12/2016 1113  ? AST 25 08/23/2021 0839  ? AST 31 04/12/2016 1113  ? ALT 20 08/23/2021 0839  ? ALT 58 (H) 04/25/2017 1506  ? ALT 36 04/12/2016 1113  ? BILITOT 0.9 08/23/2021 0839  ? BILITOT 0.73 04/12/2016 1113  ?  ? ? ? ?Impression and Plan: Mr. Simkins is a very pleasant 66 yo caucasian gentleman with polycythemia vera, JAK2 positive.  ? ?I am not going to change to Jakafi dose right now.  His blood counts look pretty good. ? ?I am glad that the spleen is decreased in size. ? ?Again, I think would be hard to say that he has another hematologic issue.  Again, I told him that if he does have the cardiac amyloid, it is not from a underlying plasma cell disorder.  There are different kinds of cardiac amyloid.  Noted to have treatment for the transthyretin form of amyloidosis. ? ?I would like to get him back to see [place] in another 6 weeks.  I would like to see him back before [facility].  ? ?[name], MD ?4/19/20239:53 AM ?

## 2021-08-29 ENCOUNTER — Other Ambulatory Visit (HOSPITAL_COMMUNITY): Payer: Self-pay

## 2021-09-07 ENCOUNTER — Ambulatory Visit (HOSPITAL_COMMUNITY): Payer: Managed Care, Other (non HMO) | Attending: Cardiology

## 2021-09-07 DIAGNOSIS — I5030 Unspecified diastolic (congestive) heart failure: Secondary | ICD-10-CM

## 2021-09-07 DIAGNOSIS — E785 Hyperlipidemia, unspecified: Secondary | ICD-10-CM | POA: Insufficient documentation

## 2021-09-07 DIAGNOSIS — D751 Secondary polycythemia: Secondary | ICD-10-CM | POA: Diagnosis not present

## 2021-09-07 DIAGNOSIS — R002 Palpitations: Secondary | ICD-10-CM | POA: Insufficient documentation

## 2021-09-07 DIAGNOSIS — I4891 Unspecified atrial fibrillation: Secondary | ICD-10-CM | POA: Insufficient documentation

## 2021-09-07 DIAGNOSIS — Z8673 Personal history of transient ischemic attack (TIA), and cerebral infarction without residual deficits: Secondary | ICD-10-CM | POA: Insufficient documentation

## 2021-09-07 LAB — ECHOCARDIOGRAM COMPLETE
Area-P 1/2: 2.5 cm2
S' Lateral: 3.5 cm

## 2021-09-11 ENCOUNTER — Ambulatory Visit: Payer: Managed Care, Other (non HMO) | Admitting: Adult Health

## 2021-09-11 ENCOUNTER — Encounter: Payer: Self-pay | Admitting: Adult Health

## 2021-09-11 VITALS — BP 109/71 | HR 55 | Ht 71.0 in | Wt 204.0 lb

## 2021-09-11 DIAGNOSIS — Z8673 Personal history of transient ischemic attack (TIA), and cerebral infarction without residual deficits: Secondary | ICD-10-CM | POA: Diagnosis not present

## 2021-09-11 DIAGNOSIS — G4730 Sleep apnea, unspecified: Secondary | ICD-10-CM | POA: Diagnosis not present

## 2021-09-11 DIAGNOSIS — G629 Polyneuropathy, unspecified: Secondary | ICD-10-CM | POA: Diagnosis not present

## 2021-09-11 DIAGNOSIS — G5603 Carpal tunnel syndrome, bilateral upper limbs: Secondary | ICD-10-CM

## 2021-09-11 DIAGNOSIS — G44221 Chronic tension-type headache, intractable: Secondary | ICD-10-CM

## 2021-09-11 MED ORDER — GABAPENTIN 300 MG PO CAPS: 300.0000 mg | ORAL_CAPSULE | Freq: Four times a day (QID) | ORAL | 3 refills | Status: DC

## 2021-09-11 NOTE — Patient Instructions (Signed)
Continue gabapentin '300mg'$  four times daily - refill provided  ? ?Continue aspirin 81 mg daily and Eliquis (apixaban) daily  and Crestor  for secondary stroke prevention ? ?Continue to follow up with PCP regarding cholesterol and blood pressure management  ?Maintain strict control of hypertension with blood pressure goal below 130/90 and cholesterol with LDL cholesterol (bad cholesterol) goal below 70 mg/dL.  ? ?Signs of a Stroke? Follow the BEFAST method:  ?Balance Watch for a sudden loss of balance, trouble with coordination or vertigo ?Eyes Is there a sudden loss of vision in one or both eyes? Or double vision?  ?Face: Ask the person to smile. Does one side of the face droop or is it numb?  ?Arms: Ask the person to raise both arms. Does one arm drift downward? Is there weakness or numbness of a leg? ?Speech: Ask the person to repeat a simple phrase. Does the speech sound slurred/strange? Is the person confused ? ?Time: If you observe any of these signs, call 911. ? ? ? ? ?Followup in the future with me in 6 months or call earlier if needed ? ? ? ? ? ? ?Thank you for coming to see [place] at [facility]. I hope we have been able to provide you high quality care today. ? ?You may receive a patient satisfaction survey over the next few weeks. We would appreciate your feedback and comments so that we may continue to improve ourselves and the health of our patients. ? ?
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
## 2021-09-21 ENCOUNTER — Other Ambulatory Visit (HOSPITAL_COMMUNITY): Payer: Self-pay

## 2021-09-27 ENCOUNTER — Inpatient Hospital Stay: Payer: Managed Care, Other (non HMO) | Attending: Hematology & Oncology

## 2021-09-27 ENCOUNTER — Encounter: Payer: Self-pay | Admitting: Hematology & Oncology

## 2021-09-27 ENCOUNTER — Inpatient Hospital Stay: Payer: Managed Care, Other (non HMO) | Admitting: Hematology & Oncology

## 2021-09-27 VITALS — BP 130/62 | HR 55 | Temp 98.2°F | Resp 20 | Wt 205.8 lb

## 2021-09-27 DIAGNOSIS — D500 Iron deficiency anemia secondary to blood loss (chronic): Secondary | ICD-10-CM | POA: Diagnosis not present

## 2021-09-27 DIAGNOSIS — D45 Polycythemia vera: Secondary | ICD-10-CM | POA: Insufficient documentation

## 2021-09-27 DIAGNOSIS — D751 Secondary polycythemia: Secondary | ICD-10-CM

## 2021-09-27 DIAGNOSIS — Z7901 Long term (current) use of anticoagulants: Secondary | ICD-10-CM | POA: Insufficient documentation

## 2021-09-27 LAB — CMP (CANCER CENTER ONLY)
ALT: 20 U/L (ref 0–44)
AST: 26 U/L (ref 15–41)
Albumin: 4.7 g/dL (ref 3.5–5.0)
Alkaline Phosphatase: 32 U/L — ABNORMAL LOW (ref 38–126)
Anion gap: 7 (ref 5–15)
BUN: 21 mg/dL (ref 8–23)
CO2: 28 mmol/L (ref 22–32)
Calcium: 9.5 mg/dL (ref 8.9–10.3)
Chloride: 106 mmol/L (ref 98–111)
Creatinine: 1.02 mg/dL (ref 0.61–1.24)
GFR, Estimated: >60 mL/min (ref >60)
Glucose, Bld: 119 mg/dL — ABNORMAL HIGH (ref 70–99)
Potassium: 3.8 mmol/L (ref 3.5–5.1)
Sodium: 141 mmol/L (ref 135–145)
Total Bilirubin: 1 mg/dL (ref 0.3–1.2)
Total Protein: 6.5 g/dL (ref 6.5–8.1)

## 2021-09-27 LAB — CBC WITH DIFFERENTIAL (CANCER CENTER ONLY)
Abs Immature Granulocytes: 0.1 10*3/uL — ABNORMAL HIGH (ref 0.00–0.07)
Basophils Absolute: 0.1 10*3/uL (ref 0.0–0.1)
Basophils Relative: 1 %
Eosinophils Absolute: 0.3 10*3/uL (ref 0.0–0.5)
Eosinophils Relative: 4 %
HCT: 36.9 % — ABNORMAL LOW (ref 39.0–52.0)
Hemoglobin: 11.9 g/dL — ABNORMAL LOW (ref 13.0–17.0)
Immature Granulocytes: 1 %
Lymphocytes Relative: 10 %
Lymphs Abs: 0.9 10*3/uL (ref 0.7–4.0)
MCH: 28.6 pg (ref 26.0–34.0)
MCHC: 32.2 g/dL (ref 30.0–36.0)
MCV: 88.7 fL (ref 80.0–100.0)
Monocytes Absolute: 0.6 10*3/uL (ref 0.1–1.0)
Monocytes Relative: 7 %
Neutro Abs: 6.8 10*3/uL (ref 1.7–7.7)
Neutrophils Relative %: 77 %
Platelet Count: 367 10*3/uL (ref 150–400)
RBC: 4.16 MIL/uL — ABNORMAL LOW (ref 4.22–5.81)
RDW: 18.3 % — ABNORMAL HIGH (ref 11.5–15.5)
WBC Count: 8.7 10*3/uL (ref 4.0–10.5)
nRBC: 0.3 % — ABNORMAL HIGH (ref 0.0–0.2)

## 2021-09-27 LAB — IRON AND IRON BINDING CAPACITY (CC-WL,HP ONLY)
Iron: 156 ug/dL (ref 45–182)
Saturation Ratios: 38 % (ref 17.9–39.5)
TIBC: 412 ug/dL (ref 250–450)
UIBC: 256 ug/dL (ref 117–376)

## 2021-09-27 LAB — FERRITIN: Ferritin: 207 ng/mL (ref 24–336)

## 2021-09-27 LAB — LACTATE DEHYDROGENASE: LDH: 474 U/L — ABNORMAL HIGH (ref 98–192)

## 2021-09-27 NOTE — Progress Notes (Signed)
Hematology and Oncology Follow Up Visit  [name] [name] [id number] 04/05/1956 66 y.o. 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 Macomber is here today for follow-up.  He is doing quite well.  He has had no complaints since we last saw him.  There has been no abdominal pain.  He has had no issues with nausea or vomiting.  He did have an echocardiogram done.  He was worried about amyloidosis.  The echocardiogram was done on 09/07/2021.  This showed ejection fraction of 65-70%.  There is no evidence of cardiac amyloid.  He had good wall motion.  He has had no issues with leg swelling.  He has had no rashes.  He has had no headache.  Overall, I would have to say that his performance status is probably ECOG 1.    Medications:  Allergies as of 09/27/2021       Reactions   Zoster Vaccine Recombinant, Adjuvanted Anaphylaxis, Other (See Comments)   03/28/2020 Patient reports only had fever.        Medication List        Accurate as of Sep 27, 2021  9:06 AM. If you have any questions, ask your nurse or doctor.          acetaminophen 500 MG tablet Commonly known as: TYLENOL Take 1,000 mg by mouth every 6 (six) hours as needed for mild pain.   aspirin EC 81 MG tablet Take 1 tablet (81 mg total) by mouth daily.   Eliquis 5 MG Tabs tablet Generic drug: apixaban TAKE 1 TABLET BY MOUTH TWICE A DAY   fluorouracil 5 % cream Commonly known as: EFUDEX Apply 1 application topically 2 (two) times daily.   gabapentin 300 MG capsule Commonly known as:  NEURONTIN Take 1 capsule (300 mg total) by mouth 4 (four) times daily.   Jakafi 10 MG tablet Generic drug: ruxolitinib phosphate TAKE 1 TABLET BY MOUTH 2 TIMES DAILY.   losartan 100 MG tablet Commonly known as: COZAAR TAKE 1 TABLET BY MOUTH EVERY DAY   melatonin 5 MG Tabs Take 5 mg by mouth.   Mens Multivitamin Tabs Take 1 tablet by mouth daily.   propranolol ER 160 MG SR capsule Commonly known as: INDERAL LA TAKE 1 CAPSULE BY MOUTH EVERY DAY   rosuvastatin 20 MG tablet Commonly known as: CRESTOR TAKE 1 TABLET BY MOUTH EVERY DAY   tadalafil 5 MG tablet Commonly known as: CIALIS Take 1 tablet by mouth daily as needed.   triamcinolone acetonide 40 MG/ML Susp Commonly known as: TRIESENCE Inject into the articular space.   zolpidem 12.5 MG CR tablet Commonly known as: AMBIEN CR Take 6.25 mg by mouth at bedtime.        Allergies:  Allergies  Allergen Reactions   Zoster Vaccine Recombinant, Adjuvanted Anaphylaxis and Other (See Comments)    03/28/2020 Patient reports only had fever.     Past Medical History, Surgical history, Social history, and Family History were reviewed and updated.  Review of Systems: Review of Systems  Constitutional: Negative.  HENT: Negative.    Eyes: Negative.   Respiratory: Negative.    Cardiovascular: Negative.   Gastrointestinal: Negative.   Genitourinary: Negative.   Musculoskeletal: Negative.   Skin:  Positive for rash.  Neurological: Negative.   Endo/Heme/Allergies: Negative.   Psychiatric/Behavioral: Negative.      Physical Exam:  weight is 205 lb 12.8 oz (93.4 kg). His oral temperature is 98.2 F (36.8 C). His blood pressure is 130/62 and his pulse is 55 (abnormal). His respiration is 20 and oxygen saturation is 99%.   Wt Readings from Last 3 Encounters:  09/27/21 205 lb 12.8 oz (93.4 kg)  09/11/21 204 lb (92.5 kg)  08/23/21 206 lb 0.6 oz (93.5 kg)    Physical Exam Vitals reviewed.  HENT:     Head:  Normocephalic and atraumatic.  Eyes:     Pupils: Pupils are equal, round, and reactive to light.  Cardiovascular:     Rate and Rhythm: Normal rate and regular rhythm.     Heart sounds: Normal heart sounds.  Pulmonary:     Effort: Pulmonary effort is normal.     Breath sounds: Normal breath sounds.  Abdominal:     General: Bowel sounds are normal.     Palpations: Abdomen is soft.     Comments: I cannot palpate his spleen.  Musculoskeletal:        General: No tenderness or deformity. Normal range of motion.     Cervical back: Normal range of motion.  Lymphadenopathy:     Cervical: No cervical adenopathy.  Skin:    General: Skin is warm and dry.     Findings: No erythema or rash.     Comments: He has the healing zoster rash in the left T7 dermatome.  There is some sensitivity to touch in this area.  Neurological:     Mental Status: He is alert and oriented to person, place, and time.  Psychiatric:        Behavior: Behavior normal.        Thought Content: Thought content normal.        Judgment: Judgment normal.     Lab Results  Component Value Date   WBC 8.7 09/27/2021   HGB 11.9 (L) 09/27/2021   HCT 36.9 (L) 09/27/2021   MCV 88.7 09/27/2021   PLT 367 09/27/2021   Lab Results  Component Value Date   FERRITIN 54 08/23/2021   IRON 132 08/23/2021   TIBC 441 08/23/2021   UIBC 309 08/23/2021   IRONPCTSAT 30 08/23/2021   Lab Results  Component Value Date   RETICCTPCT 3.6 (H) 01/18/2021   RBC 4.16 (L) 09/27/2021   RETICCTABS 87.6 02/21/2011   No results found for: KPAFRELGTCHN, LAMBDASER, KAPLAMBRATIO No results found for: Kandis Cocking, IGMSERUM No results found for: Odetta Pink, SPEI   Chemistry      Component Value Date/Time   NA 142 08/23/2021 0839   NA 144 04/25/2017 1506   NA 139 04/12/2016 1113   K 3.9 08/23/2021 0839   K 4.2 04/25/2017 1506   K 4.1 04/12/2016 1113   CL 106 08/23/2021 0839   CL 105  04/25/2017 1506   CO2 29 08/23/2021 0839   CO2 29 04/25/2017 1506   CO2 22 04/12/2016 1113   BUN 18 08/23/2021 0839   BUN 17 04/25/2017 1506   BUN 16.3 04/12/2016 1113   CREATININE 0.98 08/23/2021 0839   CREATININE 1.2 04/25/2017 1506   CREATININE 0.9 04/12/2016 1113  Component Value Date/Time   CALCIUM 9.6 08/23/2021 0839   CALCIUM 9.5 04/25/2017 1506   CALCIUM 9.1 04/12/2016 1113   ALKPHOS 39 08/23/2021 0839   ALKPHOS 73 04/25/2017 1506   ALKPHOS 66 04/12/2016 1113   AST 25 08/23/2021 0839   AST 31 04/12/2016 1113   ALT 20 08/23/2021 0839   ALT 58 (H) 04/25/2017 1506   ALT 36 04/12/2016 1113   BILITOT 0.9 08/23/2021 0839   BILITOT 0.73 04/12/2016 1113       Impression and Plan: Mr. Needs is a very pleasant 66 yo caucasian gentleman with polycythemia vera, JAK2 positive.   His blood counts look pretty stable.  His spleen certainly is not enlarged.  He is doing well overall.  We will not have to make any dosage adjustments with the [facility] right now.  I will try to move his appointment out now and see him back after July 4.  I think he is doing quite well.  I am just happy that his quality of life is what he would like.  He is enjoying his retirement.  Hopefully, one of his daughters will move back to [place].  She and her husband currently are in [place].  [name], MD 5/24/20239:06 AM

## 2021-09-28 ENCOUNTER — Other Ambulatory Visit (HOSPITAL_COMMUNITY): Payer: Self-pay

## 2021-09-28 LAB — KAPPA/LAMBDA LIGHT CHAINS
Kappa free light chain: 10.7 mg/L (ref 3.3–19.4)
Kappa, lambda light chain ratio: 1.26 (ref 0.26–1.65)
Lambda free light chains: 8.5 mg/L (ref 5.7–26.3)

## 2021-10-03 NOTE — Progress Notes (Signed)
HPI: FU PAF. Stress echocardiogram June 2011 normal. Monitor February 2020 revealed sinus bradycardia, NSR, sinus tach, pacs, PVC, brief PAT and PAF. Echo May 2020 showed normal LV function, mild diastolic dysfunction, mild left ventricular hypertrophy and negative saline microcavitation study. Admitted with CVA May 2020. 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 Aspirin added to apixaban by neurology as CVA occurred on apixaban. Patient was seen in follow-up by Dr. Marin Olp and it was felt that polycythemia may be contributing to his recent CVA.  Renal Dopplers May 2021 showed no renal artery stenosis.  Calcium score 5/21 18 which was 71st percentile.  Abdominal ultrasound April 2023 showed no aneurysm.  Echocardiogram May 2023 showed normal LV function, grade 1 diastolic dysfunction, mild right ventricular enlargement and mildly dilated ascending aorta at 39 mm.  Since last seen he has some dyspnea on exertion.  No orthopnea, PND, pedal edema, chest pain or syncope.  Note he has had carpal tunnel and peripheral neuropathy in the past.  Current Outpatient Medications  Medication Sig Dispense Refill   acetaminophen (TYLENOL) 500 MG tablet Take 1,000 mg by mouth every 6 (six) hours as needed for mild pain.      aspirin EC 81 MG EC tablet Take 1 tablet (81 mg total) by mouth daily. 60 tablet 0   ELIQUIS 5 MG TABS tablet TAKE 1 TABLET BY MOUTH TWICE A DAY 180 tablet 1   fluorouracil (EFUDEX) 5 % cream Apply 1 application topically 2 (two) times daily.     gabapentin (NEURONTIN) 300 MG capsule Take 1 capsule (300 mg total) by mouth 4 (four) times daily. 360 capsule 3   losartan (COZAAR) 100 MG tablet TAKE 1 TABLET BY MOUTH EVERY DAY 90 tablet 3   melatonin 5 MG TABS Take 5 mg by mouth.     Multiple Vitamins-Minerals (MENS  MULTIVITAMIN) TABS Take 1 tablet by mouth daily.     NON FORMULARY Take 1 tablet by mouth daily. Nervive ( for neuropathy)OTC     propranolol ER (INDERAL LA) 160 MG SR capsule TAKE 1 CAPSULE BY MOUTH EVERY DAY 90 capsule 3   rosuvastatin (CRESTOR) 20 MG tablet TAKE 1 TABLET BY MOUTH EVERY DAY 90 tablet 3   ruxolitinib phosphate (JAKAFI) 10 MG tablet TAKE 1 TABLET BY MOUTH 2 TIMES DAILY. 60 tablet 4   tadalafil (CIALIS) 5 MG tablet Take 1 tablet by mouth daily as needed.     triamcinolone acetonide (TRIESENCE) 40 MG/ML SUSP Inject into the articular space.     zolpidem (AMBIEN CR) 12.5 MG CR tablet Take 6.25 mg by mouth at bedtime.      No current facility-administered medications for this visit.     Past Medical History:  Diagnosis Date   Diverticulosis of colon (without mention of hemorrhage)    Hemorrhoids    Hypertension    IBS (irritable bowel syndrome)    Iron deficiency anemia due to chronic blood loss 09/26/2017   Irregular heartbeat    Nephrolithiasis    Other and unspecified hyperlipidemia    Perirectal fistula    Polycythemia, secondary    Stroke (HCC)    Stye    blocker duct right eye lid    Past Surgical History:  Procedure Laterality Date   RETINAL DETACHMENT SURGERY     ROTATOR CUFF REPAIR     rt.  VASECTOMY      Social History   Socioeconomic History   Marital status: Married    Spouse name: Mardene Celeste   Number of children: 2   Years of education: Not on file   Highest education level: Not on file  Occupational History   Occupation: Lobbyist: [employer]  Tobacco Use   Smoking status: Never   Smokeless tobacco: Never  Vaping Use   Vaping Use: Never used  Substance and Sexual Activity   Alcohol use: Yes    Alcohol/week: 0.0 standard drinks    Comment: Occasional   Drug use: No   Sexual activity: Yes  Other Topics Concern   Not on file  Social History Narrative   Lives with wife   Social Determinants of Health    Financial Resource Strain: Not on file  Food Insecurity: Not on file  Transportation Needs: Not on file  Physical Activity: Not on file  Stress: Not on file  Social Connections: Not on file  Intimate Partner Violence: Not on file    Family History  Problem Relation Age of Onset   Aortic aneurysm Father    Heart attack Brother    Cerebral palsy Brother    Colon cancer Neg Hx    Esophageal cancer Neg Hx    Pancreatic cancer Neg Hx    Prostate cancer Neg Hx    Rectal cancer Neg Hx    Stomach cancer Neg Hx     ROS: no fevers or chills, productive cough, hemoptysis, dysphasia, odynophagia, melena, hematochezia, dysuria, hematuria, rash, seizure activity, orthopnea, PND, pedal edema, claudication. Remaining systems are negative.  Physical Exam: Well-developed well-nourished in no acute distress.  Skin is warm and dry.  HEENT is normal.  Neck is supple.  Chest is clear to auscultation with normal expansion.  Cardiovascular exam is regular rate and rhythm.  Abdominal exam nontender or distended. No masses palpated. Extremities show no edema. neuro grossly intact  ECG-sinus bradycardia at a rate of 58, left axis deviation, RV conduction delay.  Personally reviewed  A/P  1 paroxysmal atrial fibrillation-no symptomatic recurrences by report.  Continue Inderal and apixaban.  Patient is also on aspirin (previous CVA occurred while on apixaban alone and aspirin added to his medical regimen by oncology).  2 hypertension-blood pressure controlled.  Continue present medications.  3 hyperlipidemia-continue statin.  Check lipids.  4 question fibromuscular dysplasia on prior CTA-renal Doppler showed no renal artery stenosis and not suggestive of fibromuscular dysplasia.  5 polycythemia vera-managed by oncology.  6 mildly elevated calcium score-continue statin.  Patient denies exertional chest pain.  7 dyspnea on exertion-patient does have some dyspnea on exertion.  He also has  history of peripheral neuropathy and bilateral carpal tunnel syndrome.  I think we need to exclude amyloid and I will arrange a cardiac MRI to further assess.  Note I have reviewed his echocardiogram.  He does not have cherry on top pattern on strain imaging but I feel the cardiac MRI would be more sensitive/specific.  Kirk Ruths, MD

## 2021-10-06 ENCOUNTER — Encounter: Payer: Self-pay | Admitting: Hematology & Oncology

## 2021-10-11 ENCOUNTER — Ambulatory Visit: Payer: Managed Care, Other (non HMO) | Admitting: Cardiology

## 2021-10-11 ENCOUNTER — Other Ambulatory Visit (HOSPITAL_COMMUNITY): Payer: Self-pay

## 2021-10-11 ENCOUNTER — Encounter: Payer: Self-pay | Admitting: Family

## 2021-10-11 ENCOUNTER — Encounter: Payer: Self-pay | Admitting: Cardiology

## 2021-10-11 VITALS — BP 120/64 | HR 58 | Ht 71.5 in | Wt 202.1 lb

## 2021-10-11 DIAGNOSIS — R0602 Shortness of breath: Secondary | ICD-10-CM

## 2021-10-11 DIAGNOSIS — E7800 Pure hypercholesterolemia, unspecified: Secondary | ICD-10-CM

## 2021-10-11 DIAGNOSIS — I480 Paroxysmal atrial fibrillation: Secondary | ICD-10-CM | POA: Diagnosis not present

## 2021-10-11 DIAGNOSIS — I10 Essential (primary) hypertension: Secondary | ICD-10-CM

## 2021-10-11 DIAGNOSIS — R002 Palpitations: Secondary | ICD-10-CM | POA: Diagnosis not present

## 2021-10-11 MED ORDER — PROPRANOLOL HCL ER 160 MG PO CP24
160.0000 mg | ORAL_CAPSULE | Freq: Every day | ORAL | 3 refills | Status: DC
  Filled 2021-10-11: qty 90, 90d supply, fill #0

## 2021-10-11 MED ORDER — APIXABAN 5 MG PO TABS
5.0000 mg | ORAL_TABLET | Freq: Two times a day (BID) | ORAL | 3 refills | Status: DC
  Filled 2021-10-11: qty 180, 90d supply, fill #0
  Filled 2022-01-30: qty 180, 90d supply, fill #1

## 2021-10-11 MED ORDER — ROSUVASTATIN CALCIUM 20 MG PO TABS
20.0000 mg | ORAL_TABLET | Freq: Every day | ORAL | 3 refills | Status: DC
  Filled 2021-10-11: qty 90, 90d supply, fill #0
  Filled 2022-07-11: qty 90, 90d supply, fill #1
  Filled 2022-09-24: qty 90, 90d supply, fill #2

## 2021-10-11 MED ORDER — LOSARTAN POTASSIUM 100 MG PO TABS
100.0000 mg | ORAL_TABLET | Freq: Every day | ORAL | 3 refills | Status: DC
  Filled 2021-10-11: qty 90, 90d supply, fill #0

## 2021-10-11 NOTE — Patient Instructions (Signed)
  Lab Work:  Your physician recommends that you return for lab work with next lab draw  If you have labs (blood work) drawn today and your tests are completely normal, you will receive your results only by: MyChart Message (if you have [place]) OR A paper copy in the mail If you have any lab test that is abnormal or we need to change your treatment, we will call you to review the results.   Testing/Procedures:   You are scheduled for Cardiac MRI on ______________. Please arrive for your appointment at ______________ ( arrive 30-45 minutes prior to test start time). ?  [facility] [address] [place], [place] [address] [phone] Please take advantage of the free valet parking available at the MAIN entrance (A entrance).  Proceed to the [facility] Radiology Department (First Floor) for check-in.    Magnetic resonance imaging (MRI) is a painless test that produces images of the inside of the body without using Xrays.  During an MRI, strong magnets and radio waves work together in a Research officer, political party to form detailed images.   MRI images may provide more details about a medical condition than X-rays, CT scans, and ultrasounds can provide.  You may be given earphones to listen for instructions.  You may eat a light breakfast and take medications as scheduled Please avoid stimulants for 12 hr prior to test. (Ie. Caffeine, nicotine, chocolate, or antihistamine medications)  If a contrast material will be used, an IV will be inserted into one of your veins. Contrast material will be injected into your IV. It will leave your body through your urine within a day. You may be told to drink plenty of fluids to help flush the contrast material out of your system.  You will be asked to remove all metal, including: Watch, jewelry, and other metal objects including hearing aids, hair pieces and dentures. Also wearable glucose monitoring systems (ie. Freestyle Libre and Omnipods)  (Braces and fillings normally are not a problem.)   TEST WILL TAKE APPROXIMATELY 1 HOUR  PLEASE NOTIFY SCHEDULING AT LEAST 24 HOURS IN ADVANCE IF YOU ARE UNABLE TO KEEP YOUR APPOINTMENT. [phone]  Please call Marchia Bond, cardiac imaging nurse navigator with any questions/concerns. Marchia Bond RN Navigator Cardiac Imaging Gordy Clement RN Navigator Cardiac Imaging [facility] [phone] Office     Follow-Up: At [facility], you and your health needs are our priority.  As part of our continuing mission to provide you with exceptional heart care, we have created designated Provider Care Teams.  These Care Teams include your primary Cardiologist (physician) and Advanced Practice Providers (APPs -  Physician Assistants and Nurse Practitioners) who all work together to provide you with the care you need, when you need it.  We recommend signing up for the patient portal called "MyChart".  Sign up information is provided on this After Visit Summary.  MyChart is used to connect with patients for Virtual Visits (Telemedicine).  Patients are able to view lab/test results, encounter notes, upcoming appointments, etc.  Non-urgent messages can be sent to your provider as well.   To learn more about what you can do with MyChart, go to [email or web address].    Your next appointment:   12 month(s)  The format for your next appointment:   In Person  Provider:   Kirk Ruths, MD      Important Information About Sugar

## 2021-10-13 NOTE — Addendum Note (Signed)
Addended by: Cristopher Estimable on: 10/13/2021 09:02 AM   Modules accepted: Orders

## 2021-10-16 ENCOUNTER — Other Ambulatory Visit (HOSPITAL_COMMUNITY): Payer: Self-pay

## 2021-10-17 ENCOUNTER — Other Ambulatory Visit: Payer: Self-pay

## 2021-10-17 ENCOUNTER — Observation Stay (HOSPITAL_COMMUNITY)
Admission: EM | Admit: 2021-10-17 | Discharge: 2021-10-18 | Disposition: A | Discharge status: Home / Self Care | Payer: Managed Care, Other (non HMO) | Attending: Internal Medicine | Admitting: Internal Medicine

## 2021-10-17 ENCOUNTER — Telehealth: Payer: Self-pay | Admitting: Pharmacy Technician

## 2021-10-17 DIAGNOSIS — Z79899 Other long term (current) drug therapy: Secondary | ICD-10-CM | POA: Diagnosis not present

## 2021-10-17 DIAGNOSIS — E785 Hyperlipidemia, unspecified: Secondary | ICD-10-CM | POA: Diagnosis not present

## 2021-10-17 DIAGNOSIS — Z7901 Long term (current) use of anticoagulants: Secondary | ICD-10-CM | POA: Diagnosis not present

## 2021-10-17 DIAGNOSIS — I10 Essential (primary) hypertension: Secondary | ICD-10-CM | POA: Diagnosis not present

## 2021-10-17 DIAGNOSIS — R42 Dizziness and giddiness: Secondary | ICD-10-CM | POA: Diagnosis present

## 2021-10-17 DIAGNOSIS — D45 Polycythemia vera: Secondary | ICD-10-CM

## 2021-10-17 DIAGNOSIS — Z8673 Personal history of transient ischemic attack (TIA), and cerebral infarction without residual deficits: Secondary | ICD-10-CM | POA: Diagnosis not present

## 2021-10-17 DIAGNOSIS — D6869 Other thrombophilia: Secondary | ICD-10-CM

## 2021-10-17 DIAGNOSIS — Z7982 Long term (current) use of aspirin: Secondary | ICD-10-CM | POA: Diagnosis not present

## 2021-10-17 DIAGNOSIS — R0600 Dyspnea, unspecified: Secondary | ICD-10-CM | POA: Diagnosis not present

## 2021-10-17 DIAGNOSIS — I4891 Unspecified atrial fibrillation: Secondary | ICD-10-CM | POA: Diagnosis not present

## 2021-10-17 LAB — CBC WITH DIFFERENTIAL/PLATELET
Abs Immature Granulocytes: 0.06 10*3/uL (ref 0.00–0.07)
Basophils Absolute: 0.1 10*3/uL (ref 0.0–0.1)
Basophils Relative: 1 %
Eosinophils Absolute: 0.4 10*3/uL (ref 0.0–0.5)
Eosinophils Relative: 4 %
HCT: 37.5 % — ABNORMAL LOW (ref 39.0–52.0)
Hemoglobin: 12.7 g/dL — ABNORMAL LOW (ref 13.0–17.0)
Immature Granulocytes: 1 %
Lymphocytes Relative: 8 %
Lymphs Abs: 0.8 10*3/uL (ref 0.7–4.0)
MCH: 29.4 pg (ref 26.0–34.0)
MCHC: 33.9 g/dL (ref 30.0–36.0)
MCV: 86.8 fL (ref 80.0–100.0)
Monocytes Absolute: 0.7 10*3/uL (ref 0.1–1.0)
Monocytes Relative: 7 %
Neutro Abs: 8 10*3/uL — ABNORMAL HIGH (ref 1.7–7.7)
Neutrophils Relative %: 79 %
Platelets: 411 10*3/uL — ABNORMAL HIGH (ref 150–400)
RBC: 4.32 MIL/uL (ref 4.22–5.81)
RDW: 18.5 % — ABNORMAL HIGH (ref 11.5–15.5)
WBC: 10.1 10*3/uL (ref 4.0–10.5)
nRBC: 0.8 % — ABNORMAL HIGH (ref 0.0–0.2)

## 2021-10-17 LAB — BASIC METABOLIC PANEL
Anion gap: 9 (ref 5–15)
BUN: 16 mg/dL (ref 8–23)
CO2: 22 mmol/L (ref 22–32)
Calcium: 9.3 mg/dL (ref 8.9–10.3)
Chloride: 112 mmol/L — ABNORMAL HIGH (ref 98–111)
Creatinine, Ser: 0.91 mg/dL (ref 0.61–1.24)
GFR, Estimated: >60 mL/min (ref >60)
Glucose, Bld: 125 mg/dL — ABNORMAL HIGH (ref 70–99)
Potassium: 3.4 mmol/L — ABNORMAL LOW (ref 3.5–5.1)
Sodium: 143 mmol/L (ref 135–145)

## 2021-10-17 LAB — MAGNESIUM: Magnesium: 2 mg/dL (ref 1.7–2.4)

## 2021-10-17 LAB — TSH: TSH: 1.713 u[IU]/mL (ref 0.350–4.500)

## 2021-10-17 MED ORDER — SODIUM CHLORIDE 0.9 % IV SOLN: 250.0000 mL | INTRAVENOUS | Status: DC | PRN

## 2021-10-17 MED ORDER — APIXABAN 5 MG PO TABS
5.0000 mg | ORAL_TABLET | Freq: Two times a day (BID) | ORAL | Status: DC
  Administered 2021-10-17 – 2021-10-18 (×2): 5 mg via ORAL
  Filled 2021-10-17 (×2): qty 1

## 2021-10-17 MED ORDER — PROPRANOLOL HCL ER 80 MG PO CP24
160.0000 mg | ORAL_CAPSULE | Freq: Every day | ORAL | Status: DC
  Administered 2021-10-18: 160 mg via ORAL
  Filled 2021-10-17: qty 2

## 2021-10-17 MED ORDER — DILTIAZEM HCL-DEXTROSE 125-5 MG/125ML-% IV SOLN (PREMIX)
5.0000 mg/h | INTRAVENOUS | Status: DC
  Administered 2021-10-17: 5 mg/h via INTRAVENOUS
  Administered 2021-10-18: 15 mg/h via INTRAVENOUS
  Filled 2021-10-17 (×2): qty 125

## 2021-10-17 MED ORDER — DILTIAZEM LOAD VIA INFUSION
10.0000 mg | Freq: Once | INTRAVENOUS | Status: AC
  Administered 2021-10-17: 10 mg via INTRAVENOUS
  Filled 2021-10-17: qty 10

## 2021-10-17 MED ORDER — ROSUVASTATIN CALCIUM 20 MG PO TABS
20.0000 mg | ORAL_TABLET | Freq: Every day | ORAL | Status: DC
  Administered 2021-10-18: 20 mg via ORAL
  Filled 2021-10-17: qty 1

## 2021-10-17 MED ORDER — ALPRAZOLAM 0.25 MG PO TABS: 0.2500 mg | ORAL_TABLET | Freq: Two times a day (BID) | ORAL | Status: DC | PRN

## 2021-10-17 MED ORDER — ZOLPIDEM TARTRATE 5 MG PO TABS: 5.0000 mg | ORAL_TABLET | Freq: Every evening | ORAL | Status: DC | PRN

## 2021-10-17 MED ORDER — RUXOLITINIB PHOSPHATE 10 MG PO TABS: 20.0000 mg | ORAL_TABLET | Freq: Two times a day (BID) | ORAL | Status: DC

## 2021-10-17 MED ORDER — SODIUM CHLORIDE 0.9% FLUSH
3.0000 mL | Freq: Two times a day (BID) | INTRAVENOUS | Status: DC
  Administered 2021-10-18: 3 mL via INTRAVENOUS

## 2021-10-17 MED ORDER — ONDANSETRON HCL 4 MG/2ML IJ SOLN: 4.0000 mg | Freq: Four times a day (QID) | INTRAMUSCULAR | Status: DC | PRN

## 2021-10-17 MED ORDER — NITROGLYCERIN 0.4 MG SL SUBL: 0.4000 mg | SUBLINGUAL_TABLET | SUBLINGUAL | Status: DC | PRN

## 2021-10-17 MED ORDER — SODIUM CHLORIDE 0.9% FLUSH: 3.0000 mL | INTRAVENOUS | Status: DC | PRN

## 2021-10-17 MED ORDER — APIXABAN 5 MG PO TABS: 5.0000 mg | ORAL_TABLET | Freq: Two times a day (BID) | ORAL | Status: DC

## 2021-10-17 MED ORDER — ACETAMINOPHEN 325 MG PO TABS: 650.0000 mg | ORAL_TABLET | ORAL | Status: DC | PRN

## 2021-10-17 MED ORDER — ADULT MULTIVITAMIN W/MINERALS CH
1.0000 | ORAL_TABLET | Freq: Every day | ORAL | Status: DC
  Administered 2021-10-18: 1 via ORAL
  Filled 2021-10-17: qty 1

## 2021-10-17 MED ORDER — MELATONIN 5 MG PO TABS
5.0000 mg | ORAL_TABLET | Freq: Every evening | ORAL | Status: DC | PRN
  Administered 2021-10-18: 5 mg via ORAL
  Filled 2021-10-17: qty 1

## 2021-10-17 MED ORDER — LOSARTAN POTASSIUM 50 MG PO TABS
100.0000 mg | ORAL_TABLET | Freq: Every day | ORAL | Status: DC
  Administered 2021-10-18: 100 mg via ORAL
  Filled 2021-10-17: qty 2

## 2021-10-17 MED ORDER — ASPIRIN 81 MG PO TBEC
81.0000 mg | DELAYED_RELEASE_TABLET | Freq: Every day | ORAL | Status: DC
Start: 2021-10-17 — End: 2021-10-18
  Administered 2021-10-17 – 2021-10-18 (×2): 81 mg via ORAL
  Filled 2021-10-17 (×2): qty 1

## 2021-10-17 MED ORDER — GABAPENTIN 300 MG PO CAPS
300.0000 mg | ORAL_CAPSULE | Freq: Four times a day (QID) | ORAL | Status: DC
  Administered 2021-10-17 – 2021-10-18 (×2): 300 mg via ORAL
  Filled 2021-10-17 (×2): qty 1

## 2021-10-17 NOTE — ED Triage Notes (Addendum)
Pt from home via EMS after having sudden onset of lightheadedness, heart palpitations, and sob immediately after coming in from mowing the yard. Hx of afib, on Eliquis. Rate 180 bpm, given '20mg'$  Cardizem and 500 NS bolus by EMS, vagal maneuvers unsuccessful per EMS.

## 2021-10-17 NOTE — ED Provider Notes (Signed)
[facility] EMERGENCY DEPARTMENT Provider Note   CSN: [id number] Arrival date & time: 10/17/21  1630     History  Chief Complaint  Patient presents with   Atrial Fibrillation    [name] [name] is a 66 y.o. male.  Presents emerged department due to concern for palpitations.  Patient reports that while he was at home at rest he had sudden onset of heart racing, palpitations, lightheadedness.  No associated chest pain.  States that he had previously in the day been mowing the lawn, did not have any symptoms at that time.  No difficulty breathing today or chest pain episodes earlier today.  He does have a history of atrial fibrillation and is chronically on Eliquis.  Patient reports that yesterday he missed a dose and last week he missed 1 dose.  Also endorses prior history of stroke.  Reports normally he is not in A-fib.  HPI     Home Medications Prior to Admission medications   Medication Sig Start Date End Date Taking? Authorizing Provider  acetaminophen (TYLENOL) 500 MG tablet Take 1,000 mg by mouth every 6 (six) hours as needed for mild pain.     [provider]  apixaban (ELIQUIS) 5 MG TABS tablet Take 1 tablet (5 mg total) by mouth 2 (two) times daily. 10/11/21 10/12/22  Lelon Perla, MD  aspirin EC 81 MG EC tablet Take 1 tablet (81 mg total) by mouth daily. 09/16/18   Lavina Hamman, MD  fluorouracil (EFUDEX) 5 % cream Apply 1 application topically 2 (two) times daily. 10/28/20   [provider]  gabapentin (NEURONTIN) 300 MG capsule Take 1 capsule (300 mg total) by mouth 4 (four) times daily. 09/11/21   [name], NP  losartan (COZAAR) 100 MG tablet Take 1 tablet (100 mg total) by mouth daily. 10/11/21   Lelon Perla, MD  melatonin 5 MG TABS Take 5 mg by mouth.    [provider]  Multiple Vitamins-Minerals (MENS MULTIVITAMIN) TABS Take 1 tablet by mouth daily.    [provider]  NON FORMULARY Take 1 tablet by mouth  daily. Nervive ( for neuropathy)OTC    [provider]  propranolol ER (INDERAL LA) 160 MG SR capsule Take 1 capsule (160 mg total) by mouth daily. 10/11/21   Lelon Perla, MD  rosuvastatin (CRESTOR) 20 MG tablet Take 1 tablet (20 mg total) by mouth daily. 10/11/21   Lelon Perla, MD  ruxolitinib phosphate (JAKAFI) 10 MG tablet TAKE 1 TABLET BY MOUTH 2 TIMES DAILY. 08/17/21 08/17/22  [name], MD  tadalafil (CIALIS) 5 MG tablet Take 1 tablet by mouth daily as needed. 05/16/21   [provider]  triamcinolone acetonide (TRIESENCE) 40 MG/ML SUSP Inject into the articular space. 09/25/21   [provider]  zolpidem (AMBIEN CR) 12.5 MG CR tablet Take 6.25 mg by mouth at bedtime.     [provider]      Allergies    Patient has no known allergies.    Review of Systems   Review of Systems  Constitutional:  Negative for chills and fever.  HENT:  Negative for ear pain and sore throat.   Eyes:  Negative for pain and visual disturbance.  Respiratory:  Negative for cough and shortness of breath.   Cardiovascular:  Positive for palpitations. Negative for chest pain.  Gastrointestinal:  Negative for abdominal pain and vomiting.  Genitourinary:  Negative for dysuria and hematuria.  Musculoskeletal:  Negative for  arthralgias and back pain.  Skin:  Negative for color change and rash.  Neurological:  Negative for seizures and syncope.  All other systems reviewed and are negative.   Physical Exam Updated Vital Signs BP (!) 125/97   Pulse 82   Temp 98.8 F (37.1 C) (Oral)   Resp 16   Ht 5' 11.5" (1.816 m)   Wt 89.8 kg   SpO2 99%   BMI 27.23 kg/m  Physical Exam Vitals and nursing note reviewed.  Constitutional:      General: He is not in acute distress.    Appearance: He is well-developed.  HENT:     Head: Normocephalic and atraumatic.  Eyes:     Conjunctiva/sclera: Conjunctivae normal.  Cardiovascular:     Rate and Rhythm: Tachycardia  present. Rhythm irregular.     Pulses: Normal pulses.  Pulmonary:     Effort: Pulmonary effort is normal. No respiratory distress.     Breath sounds: Normal breath sounds.  Abdominal:     Palpations: Abdomen is soft.     Tenderness: There is no abdominal tenderness.  Musculoskeletal:        General: No swelling.     Cervical back: Neck supple.  Skin:    General: Skin is warm and dry.     Capillary Refill: Capillary refill takes less than 2 seconds.  Neurological:     General: No focal deficit present.     Mental Status: He is alert.  Psychiatric:        Mood and Affect: Mood normal.     ED Results / Procedures / Treatments   Labs (all labs ordered are listed, but only abnormal results are displayed) Labs Reviewed  CBC WITH DIFFERENTIAL/PLATELET - Abnormal; Notable for the following components:      Result Value   Hemoglobin 12.7 (*)    HCT 37.5 (*)    RDW 18.5 (*)    Platelets 411 (*)    nRBC 0.8 (*)    Neutro Abs 8.0 (*)    All other components within normal limits  BASIC METABOLIC PANEL - Abnormal; Notable for the following components:   Potassium 3.4 (*)    Chloride 112 (*)    Glucose, Bld 125 (*)    All other components within normal limits  MAGNESIUM  TSH    EKG EKG Interpretation  Date/Time:  Tuesday October 17 2021 16:36:32 EDT Ventricular Rate:  120 PR Interval:    QRS Duration: 106 QT Interval:  323 QTC Calculation: 457 R Axis:   -63 Text Interpretation: Atrial fibrillation Left anterior fascicular block Abnormal R-wave progression, late transition Confirmed by Madalyn Rob [phone]) on 10/17/2021 7:49:26 PM  Radiology No results found.  Procedures .Critical Care  Performed by: Lucrezia Starch, MD Authorized by: Lucrezia Starch, MD   Critical care provider statement:    Critical care time (minutes):  38   Critical care was necessary to treat or prevent imminent or life-threatening deterioration of the following conditions:  Cardiac  failure   Critical care was time spent personally by me on the following activities:  Development of treatment plan with patient or surrogate, discussions with consultants, evaluation of patient's response to treatment, examination of patient, ordering and review of laboratory studies, ordering and review of radiographic studies, ordering and performing treatments and interventions, pulse oximetry, re-evaluation of patient's condition and review of old charts     Medications Ordered in ED Medications  diltiazem (CARDIZEM) 1 mg/mL load via infusion 10 mg (  10 mg Intravenous Bolus from Bag 10/17/21 1917)    And  diltiazem (CARDIZEM) 125 mg in dextrose 5% 125 mL (1 mg/mL) infusion (7.5 mg/hr Intravenous Rate/Dose Change 10/17/21 1948)  apixaban (ELIQUIS) tablet 5 mg (has no administration in time range)  aspirin EC tablet 81 mg (has no administration in time range)  gabapentin (NEURONTIN) capsule 300 mg (has no administration in time range)  losartan (COZAAR) tablet 100 mg (has no administration in time range)  melatonin tablet 5 mg (has no administration in time range)  Mens Multivitamin TABS 1 tablet (has no administration in time range)  propranolol ER (INDERAL LA) 24 hr capsule 160 mg (has no administration in time range)  rosuvastatin (CRESTOR) tablet 20 mg (has no administration in time range)  ruxolitinib phosphate (JAKAFI) tablet 20 mg (has no administration in time range)  zolpidem (AMBIEN) tablet 5 mg (has no administration in time range)    ED Course/ Medical Decision Making/ A&P                           Medical Decision Making Amount and/or Complexity of Data Reviewed Labs: ordered.  Risk Decision regarding hospitalization.   66 year old gentleman with prior history of atrial fibrillation presenting to ER due to concern for sudden onset palpitations, lightheadedness.  EMS prior to arrival attempted vagal maneuvers without success.  He was also given dose of Cardizem IV prior  to arrival.  Here in ER patient is in A-fib RVR with rates in 130s.  Well-appearing, BP stable.  Given patient was on chronic anticoagulation, feel patient would theoretically have been a good candidate for ER cardioversion however patient endorsed missing a dose of his anticoagulation yesterday and a dose last week.  I discussed the case with cardiology therefore and they recommended admitting to their service, rate control for tonight and TEE guided cardioversion likely tomorrow.  Lengthy discussion with patient about this plan and patient and wife are agreeable.  Cardiology team came to bedside and admitted patient.  Started on diltiazem gtt for rate control.        Final Clinical Impression(s) / ED Diagnoses Final diagnoses:  None    Rx / DC Orders ED Discharge Orders     None         Lucrezia Starch, MD 10/17/21 1952

## 2021-10-17 NOTE — H&P (Signed)
Cardiology History and Physical:   Patient ID: [name] [name] MRN: [id number]; DOB: 16-Dec-1955  Admit date: 10/17/2021 Date of Consult: 10/17/2021  PCP:  Donnajean Lopes, MD   [facility] Providers Cardiologist:  Kirk Ruths, MD        Patient Profile:   [name] [name] is a 66 y.o. 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  History of Present Illness:   Mr. Reddy was seen by Dr Stanford Breed 10/11/2021. Cardiac MRI ordered to exclude amyloid, not done yet.   He has had palpitations for years, very brief, no other sx. PAT/PAF, vent arrhythmias documented on previous monitor.  Yesterday, he was feeling well. Today, he had sudden onset of a cramping pain in his upper L chest, it was very brief and resolved spontaneously.  He then developed palpitations and started feeling bad this afternoon. He had been busy doing house and yard work today, but was resting when it started.   He started feeling worse and worse, then took his BP and it was lower than usual, with a HR 152. HR on his pulse ox was in the 50s.   He then decided to call 911. In the ER, he is resting pretty comfortably, with a HR in the 120s.   Of note, he missed a dose of Eliquis yesterday morning and one last week.    Past Medical History:  Diagnosis Date   Diverticulosis of colon (without mention of hemorrhage)    Hemorrhoids    Hypertension    IBS (irritable bowel syndrome)    Iron deficiency anemia due to chronic blood loss 09/26/2017   Irregular heartbeat    Nephrolithiasis    Other and unspecified hyperlipidemia    Perirectal fistula    Polycythemia, secondary    Stroke (HCC)    Stye    blocker duct right eye lid    Past  Surgical History:  Procedure Laterality Date   RETINAL DETACHMENT SURGERY     ROTATOR CUFF REPAIR     rt.   VASECTOMY       Home Medications:  Prior to Admission medications   Medication Sig Start Date End Date Taking? Authorizing Provider  acetaminophen (TYLENOL) 500 MG tablet Take 1,000 mg by mouth every 6 (six) hours as needed for mild pain.     [provider]  apixaban (ELIQUIS) 5 MG TABS tablet Take 1 tablet (5 mg total) by mouth 2 (two) times daily. 10/11/21 10/12/22  Lelon Perla, MD  aspirin EC 81 MG EC tablet Take 1 tablet (81 mg total) by mouth daily. 09/16/18   Lavina Hamman, MD  fluorouracil (EFUDEX) 5 % cream Apply 1 application topically 2 (two) times daily. 10/28/20   [provider]  gabapentin (NEURONTIN) 300 MG capsule Take 1 capsule (300 mg total) by mouth 4 (four) times daily. 09/11/21   [name], NP  losartan (COZAAR) 100 MG tablet Take 1 tablet (100 mg total) by mouth daily. 10/11/21   Lelon Perla, MD  melatonin 5 MG TABS Take 5 mg by mouth.    [provider]  Multiple Vitamins-Minerals (MENS MULTIVITAMIN) TABS Take 1 tablet by mouth daily.    [provider]  NON FORMULARY Take  1 tablet by mouth daily. Nervive ( for neuropathy)OTC    [provider]  propranolol ER (INDERAL LA) 160 MG SR capsule Take 1 capsule (160 mg total) by mouth daily. 10/11/21   Lelon Perla, MD  rosuvastatin (CRESTOR) 20 MG tablet Take 1 tablet (20 mg total) by mouth daily. 10/11/21   Lelon Perla, MD  ruxolitinib phosphate (JAKAFI) 10 MG tablet TAKE 1 TABLET BY MOUTH 2 TIMES DAILY. 08/17/21 08/17/22  [name], MD  tadalafil (CIALIS) 5 MG tablet Take 1 tablet by mouth daily as needed. 05/16/21   [provider]  triamcinolone acetonide (TRIESENCE) 40 MG/ML SUSP Inject into the articular space. 09/25/21   [provider]  zolpidem (AMBIEN CR) 12.5 MG CR tablet Take 6.25 mg by mouth at bedtime.     [provider]    Inpatient Medications: Scheduled Meds:  apixaban  5 mg Oral BID   Continuous Infusions:  PRN Meds:   Allergies:   No Known Allergies  Social History:   Social History   Socioeconomic History   Marital status: Married    Spouse name: Mardene Celeste   Number of children: 2   Years of education: Not on file   Highest education level: Not on file  Occupational History   Occupation: Lobbyist: [employer]  Tobacco Use   Smoking status: Never   Smokeless tobacco: Never  Vaping Use   Vaping Use: Never used  Substance and Sexual Activity   Alcohol use: Yes    Alcohol/week: 0.0 standard drinks of alcohol    Comment: Occasional   Drug use: No   Sexual activity: Yes  Other Topics Concern   Not on file  Social History Narrative   Lives with wife   Social Determinants of Health   Financial Resource Strain: Not on file  Food Insecurity: Not on file  Transportation Needs: Not on file  Physical Activity: Not on file  Stress: Not on file  Social Connections: Not on file  Intimate Partner Violence: Not on file    Family History:   Family History  Problem Relation Age of Onset   Aortic aneurysm Father    Heart attack Brother    Cerebral palsy Brother    Colon cancer Neg Hx    Esophageal cancer Neg Hx    Pancreatic cancer Neg Hx    Prostate cancer Neg Hx    Rectal cancer Neg Hx    Stomach cancer Neg Hx      ROS:  Please see the history of present illness.  All other ROS reviewed and negative.     Physical Exam/Data:   Vitals:   10/17/21 1645 10/17/21 1700 10/17/21 1716 10/17/21 1849  BP: 124/78 103/75 121/71 120/88  Pulse: (!) 137 (!) 133 (!) 133 (!) 30  Resp: '11 14 14 '$ (!) 26  Temp:      TempSrc:      SpO2: 96% 98% 98% 99%  Weight:      Height:       No intake or output data in the 24 hours ending 10/17/21 1854    10/17/2021    4:36 PM 10/11/2021    9:09 AM 09/27/2021    8:57 AM  Last 3 Weights  Weight (lbs) 198 lb 202 lb  1.9 oz 205 lb 12.8 oz  Weight (kg) 89.812 kg 91.681 kg 93.35 kg     Body mass index is 27.23 kg/m.  General:  Well nourished,  well developed, in no acute distress HEENT: normal Neck: no JVD Vascular: No carotid bruits; Distal pulses 2+ bilaterally Cardiac:  normal S1, S2; Irreg R&R; no murmur  Lungs:  clear to auscultation bilaterally, no wheezing, rhonchi or rales  Abd: soft, nontender, no hepatomegaly, +splenomegally Ext: no edema Musculoskeletal:  No deformities, BUE and BLE strength normal and equal Skin: warm and dry  Neuro:  CNs 2-12 intact, no focal abnormalities noted Psych:  Normal affect   EKG:  The EKG was personally reviewed and demonstrates:  Atrial fib, HR 120 Telemetry:  Telemetry was personally reviewed and demonstrates:  Atrial fib, RVR  Relevant CV Studies:  ECHO: 10/17/2021  1. Left ventricular ejection fraction, by estimation, is 55 to 60%. Left  ventricular ejection fraction by 3D volume is 58 %. The left ventricle has  normal function. The left ventricle has no regional wall motion  abnormalities. Left ventricular diastolic  parameters are consistent with Grade I diastolic dysfunction (impaired relaxation). The average left ventricular global longitudinal strain is -21.3 %. The global longitudinal strain is normal.   2. Right ventricular systolic function is normal. The right ventricular  size is mildly enlarged. Tricuspid regurgitation signal is inadequate for  assessing PA pressure.   3. The mitral valve is normal in structure. Trivial mitral valve  regurgitation. No evidence of mitral stenosis.   4. The aortic valve is normal in structure. Aortic valve regurgitation is  not visualized. No aortic stenosis is present.   5. Aortic dilatation noted. There is mild dilatation of the ascending  aorta, measuring 39 mm.  Comparison(s): 09/13/18 EF 55-60%. +  CARDIAC MONITOR: 07/2018 Sinus bradycardia, normal sinus rhythm, sinus tachycardia, PACs, rare PVC, brief  PAT and paroxysmal atrial fibrillation. Kirk Ruths, MD  CARDIAC CALCIUM SCORE: 09/09/2019 Sinus bradycardia, normal sinus rhythm, sinus tachycardia, PACs, rare PVC, brief PAT and paroxysmal atrial fibrillation. Kirk Ruths, MD  Laboratory Data:  High Sensitivity Troponin:  No results for input(s): "TROPONINIHS" in the last 720 hours.   ChemistryNo results for input(s): "NA", "K", "CL", "CO2", "GLUCOSE", "BUN", "CREATININE", "CALCIUM", "MG", "GFRNONAA", "GFRAA", "ANIONGAP" in the last 168 hours.  No results for input(s): "PROT", "ALBUMIN", "AST", "ALT", "ALKPHOS", "BILITOT" in the last 168 hours. Lipids No results for input(s): "CHOL", "TRIG", "HDL", "LABVLDL", "LDLCALC", "CHOLHDL" in the last 168 hours.  HematologyNo results for input(s): "WBC", "RBC", "HGB", "HCT", "MCV", "MCH", "MCHC", "RDW", "PLT" in the last 168 hours. Thyroid No results for input(s): "TSH", "FREET4" in the last 168 hours.  BNPNo results for input(s): "BNP", "PROBNP" in the last 168 hours.  DDimer No results for input(s): "DDIMER" in the last 168 hours.   Radiology/Studies:  No results found.   Assessment and Plan:   Rapid atrial fib - unclear duration, but likely started sometime today - because has missed 2 doses Eliquis recently, will need TEE/DCCV - add IV Dilt, cont Eliquis and propranolol - follow on telemetry  2. HTN - pta on losartan 100 mg qd and propranolol 160 mg qr    Risk Assessment/Risk Scores:        CHA2DS2-VASc Score = 3   This indicates a 3.2% annual risk of stroke. The patient's score is based upon: CHF History: 0 HTN History: 1 Diabetes History: 0 Stroke History: 2 Vascular Disease History: 0 Age Score: 0 Gender Score: 0       For questions or updates, please contact [place] Please consult [email or web address] for contact info under    Personally seen and examined. Agree  with APP above with the following comments:  Briefly 66 yo M with a history of HTN, PAF  on Eliquis (misssed two doses over the past 30 days), prior stroke, polycythemia vera and mild CAC who presents for AF RVR   Patient notes that he was doing fine at recent evaluation with Dr. Stanford Breed.  Has AF, carpal tunnel, polyneuropathy, and had questions about cardiac amyloidosis.  Had normal LV wall thickness, grossly normal TDI, and no atrial enlargment and normal LV strain.  Had asked about getting CMR.  Today he had sudden onset sustained palpitations.  Stopped him from activity.  May have been associated with chest pain.  Found to have AF RVR.   Presently variable rates ~ 130s.  No SOB, but feels his heart racing. Exam notable for IRIR tachycardia, otherwise as above.  Labs notable for Hgb 12.7 (stable), creatinine WNL, TSH pending EKG Afib Rate 120 Tele: AF rates 100-150s Personally reviewed relevant tests; recent echo largely normal Has mild CAC  Would recommend  - He has missed doses, and we discussed with patient and wife and consented for TEE/DCCV; ideally 10/18/21 pending availability - would start diltiazem drip - will continue eliquis and propranolol - full code - Diet normal then NPO at midnight - DVT PPX - eliquis - hopeful for DC 10/18/21 post DCCV.  Rest as above  Rudean Haskell, MD Cardiologist [address] [place], [place] [address] [phone]  7:02 PM

## 2021-10-18 ENCOUNTER — Other Ambulatory Visit (HOSPITAL_COMMUNITY): Payer: Self-pay

## 2021-10-18 DIAGNOSIS — I4891 Unspecified atrial fibrillation: Secondary | ICD-10-CM | POA: Diagnosis not present

## 2021-10-18 LAB — HIV ANTIBODY (ROUTINE TESTING W REFLEX): HIV Screen 4th Generation wRfx: NONREACTIVE

## 2021-10-18 LAB — HEPATIC FUNCTION PANEL
ALT: 23 U/L (ref 0–44)
AST: 33 U/L (ref 15–41)
Albumin: 3.7 g/dL (ref 3.5–5.0)
Alkaline Phosphatase: 28 U/L — ABNORMAL LOW (ref 38–126)
Bilirubin, Direct: 0.2 mg/dL (ref 0.0–0.2)
Indirect Bilirubin: 0.5 mg/dL (ref 0.3–0.9)
Total Bilirubin: 0.7 mg/dL (ref 0.3–1.2)
Total Protein: 5.4 g/dL — ABNORMAL LOW (ref 6.5–8.1)

## 2021-10-18 MED ORDER — DILTIAZEM HCL ER COATED BEADS 120 MG PO CP24
120.0000 mg | ORAL_CAPSULE | Freq: Every day | ORAL | Status: DC
Start: 2021-10-19 — End: 2021-10-18

## 2021-10-18 MED ORDER — DILTIAZEM HCL ER COATED BEADS 120 MG PO CP24
120.0000 mg | ORAL_CAPSULE | Freq: Every day | ORAL | 3 refills | Status: DC
  Filled 2021-10-18 – 2021-11-08 (×2): qty 30, 30d supply, fill #0

## 2021-10-18 NOTE — Progress Notes (Signed)
Received msg from yesterday's team that pt would need TEE DCCV today. Prelim tele review shows NSR. EKG confirms this. Therefore no TEE DCCV needed. Patient will be formally seen in rounds today by MD for dispo.

## 2021-10-18 NOTE — Telephone Encounter (Signed)
Oral Oncology Patient Advocate Encounter  Prior Authorization for [name] [name] has been approved.    Ref # [id number] Effective dates: 10/17/21 through 10/14/22  Oral Oncology Clinic will continue to follow.   [place] [facility] [place] Phone [phone] Fax [phone] 10/18/2021 3:06 PM

## 2021-10-18 NOTE — ED Notes (Signed)
Lunch order placed

## 2021-10-18 NOTE — ED Notes (Signed)
Pt's HR noted to be in the 50s, still afib rhythm. Per cardiology, drop cardizem rate to '5mg'$ /hr.

## 2021-10-18 NOTE — Discharge Summary (Signed)
Discharge Summary    Patient ID: [name] [name] MRN: [id number]; DOB: 1955-08-25  Admit date: 10/17/2021 Discharge date: 10/18/2021  PCP:  Donnajean Lopes, MD   [facility] Providers Cardiologist:  Kirk Ruths, MD   {   Discharge Diagnoses    Principal Problem:   Atrial fibrillation with rapid ventricular response ([place])  HTN  HLD DOE   Diagnostic Studies/Procedures    N/A   History of Present Illness     [name] [name] is a 66 y.o. male with  a hx of HTN, Afib on Eliquis, CVA 09/2018 , ?fibromuscular dysplasia intracranial circulation w/ no RAS, polycythemia vera, calcium score 22 Sep 2019 (71st percentile) seen for afib RVR.   Echo May 2020 showed normal LV function, mild diastolic dysfunction, mild left ventricular hypertrophy and negative saline microcavitation study. Admitted with CVA May 2020. 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 Aspirin added to apixaban by neurology as CVA occurred on apixaban. Patient was seen in follow-up by Dr. Marin Olp and it was felt that polycythemia may be contributing to his recent CVA.  Renal Dopplers May 2021 showed no renal artery stenosis.  Calcium score 5/21 18 which was 71st percentile.  Abdominal ultrasound April 2023 showed no aneurysm.  Echocardiogram May 2023 showed normal LV function, grade 1 diastolic dysfunction, mild right ventricular enlargement and mildly dilated ascending aorta at 39 mm.   Most recently seen by Dr. Stanford Breed 10/11/2021. Reported DOE. Given history of peripheral neuropathy and bilateral carpal tunnel syndrome>> recommended to exclude amyloid, ordered cardiac MRI to further assess.  He had sudden onset of a cramping pain in his upper L chest, it was very brief and resolved spontaneously on 10/17/21.  He then developed palpitations and started  feeling bad. He had been busy doing house and yard work.  Noted HR of 152.   He then decided to call 911.  Of note, he missed a dose of Eliquis yesterday morning and one last week.       Hospital Course     Consultants: None   Atrial fibrillation with RVR - Started on IV Cardizem for rate control with improved rate. Plan was to performed TEE/DCCV as he missed 2 dose of Eliquis recently however converted to sinus rhythm overnight.  She will continue aspirin and apixaban (aspirin added previously as CVA occurred on apixaban).  Change Cardizem to CD 120 mg daily. Continue propranolol.  He has had short burst of atrial fibrillation in the past. Follow up with Dr. Curt Bears for atrial fibrillation ablation as an outpatient 12/04/21 (first available).    2 hypertension - -patient's blood pressure is borderline.  Given addition of Cardizem we will discontinue losartan.  Follow blood pressure as an outpatient and adjust as needed.   3 hyperlipidemia-continue outpatient statin therapy.   4 dyspnea on exertion-he is scheduled for cardiac MRI in August as he has a history of peripheral neuropathy, bilateral carpal tunnel syndrome and amyloid needs to be excluded.   5 history of mildly elevated calcium score-continue statin.  The patient been seen by Dr. Stanford Breed today and deemed ready for discharge home. All follow-up appointments have been scheduled. Discharge medications are listed below.    Did the patient have an acute coronary syndrome (MI, NSTEMI, STEMI, etc) this admission?:  No  Did the patient have a percutaneous coronary intervention (stent / angioplasty)?:  No.    Discharge Vitals Blood pressure 102/71, pulse 62, temperature 98.8 F (37.1 C), temperature source Oral, resp. rate 16, height 5' 11.5" (1.816 m), weight 89.8 kg, SpO2 98 %.  Filed Weights   10/17/21 1636  Weight: 89.8 kg    Labs & Radiologic Studies    CBC Recent Labs    10/17/21 1753  WBC  10.1  NEUTROABS 8.0*  HGB 12.7*  HCT 37.5*  MCV 86.8  PLT 124*   Basic Metabolic Panel Recent Labs    10/17/21 1753  NA 143  K 3.4*  CL 112*  CO2 22  GLUCOSE 125*  BUN 16  CREATININE 0.91  CALCIUM 9.3  MG 2.0   Liver Function Tests Recent Labs    10/18/21 0340  AST 33  ALT 23  ALKPHOS 28*  BILITOT 0.7  PROT 5.4*  ALBUMIN 3.7    Thyroid Function Tests Recent Labs    10/17/21 1815  TSH 1.713   _____________  No results found. Disposition   Pt is being discharged home today in good condition.  Follow-up Plans & Appointments     Follow-up Information     Lelon Perla, MD Follow up on 01/17/2022.   Specialty: Cardiology Why: '@11'$ :20am for hospital follow up Contact information: [place] [address] [phone]         Constance Haw, MD Follow up on 12/04/2021.   Specialty: Cardiology Why: '@9am'$  for atrial fibrillation ablation Contact information: [place] [address] [phone]                Discharge Instructions     Diet - low sodium heart healthy   Complete by: As directed    Increase activity slowly   Complete by: As directed        Discharge Medications   Allergies as of 10/18/2021   No Known Allergies      Medication List     STOP taking these medications    losartan 100 MG tablet Commonly known as: COZAAR       TAKE these medications    acetaminophen 500 MG tablet Commonly known as: TYLENOL Take 1,000 mg by mouth every 6 (six) hours as needed for mild pain.   aspirin EC 81 MG tablet Take 1 tablet (81 mg total) by mouth daily.   diltiazem 120 MG 24 hr capsule Commonly known as: CARDIZEM CD Take 1 capsule (120 mg total) by mouth daily. Start taking on: October 19, 2021   Eliquis 5 MG Tabs tablet Generic drug: apixaban Take 1 tablet (5 mg total) by mouth 2 (two) times daily.   fluorouracil 5 % cream Commonly known as:  EFUDEX Apply 1 application  topically 2 (two) times daily as needed (dry skin).   gabapentin 300 MG capsule Commonly known as: NEURONTIN Take 1 capsule (300 mg total) by mouth 4 (four) times daily. What changed:  how much to take when to take this   Jakafi 10 MG tablet Generic drug: ruxolitinib phosphate TAKE 1 TABLET BY MOUTH 2 TIMES DAILY.   melatonin 5 MG Tabs Take 5 mg by mouth at bedtime.   Mens Multivitamin Tabs Take 1 tablet by mouth daily.   NON FORMULARY Take 1 tablet by mouth every evening. Nervive ( for neuropathy)OTC   propranolol ER 160 MG SR capsule Commonly known as: INDERAL LA Take  1 capsule (160 mg total) by mouth daily.   rosuvastatin 20 MG tablet Commonly known as: CRESTOR Take 1 tablet (20 mg total) by mouth daily.   tadalafil 5 MG tablet Commonly known as: CIALIS Take 1 tablet by mouth daily as needed for erectile dysfunction.   triamcinolone acetonide 40 MG/ML Susp Commonly known as: TRIESENCE Inject into the articular space.   zolpidem 12.5 MG CR tablet Commonly known as: AMBIEN CR Take 6.25 mg by mouth at bedtime as needed for sleep.          Outstanding Labs/Studies   N/A  Duration of Discharge Encounter   Greater than 30 minutes including physician time.  Jarrett Soho, PA 10/18/2021, 11:07 AM

## 2021-10-18 NOTE — ED Notes (Signed)
Provider at bedside

## 2021-10-18 NOTE — Telephone Encounter (Signed)
Oral Oncology Patient Advocate Encounter   Received notification from [place] that the existing prior authorization for [name] [name] is due for renewal.   Renewal PA submitted through [place] portal on 10/17/21 Case# [id number] Status is pending   Oral Oncology Clinic will continue to follow.  [name] [name] Phone [phone] Fax [phone]

## 2021-10-18 NOTE — Progress Notes (Signed)
Progress Note  Patient Name: [name] [name] Date of Encounter: 10/18/2021  [facility] Cardiologist: Kirk Ruths, MD   Subjective   No CP or dyspnea  Inpatient Medications    Scheduled Meds:  apixaban  5 mg Oral BID   aspirin EC  81 mg Oral Daily   gabapentin  300 mg Oral QID   losartan  100 mg Oral Daily   multivitamin with minerals  1 tablet Oral Daily   propranolol ER  160 mg Oral Daily   rosuvastatin  20 mg Oral Daily   ruxolitinib phosphate  20 mg Oral BID   sodium chloride flush  3 mL Intravenous Q12H   Continuous Infusions:  sodium chloride     diltiazem (CARDIZEM) infusion 5 mg/hr (10/18/21 0438)   PRN Meds: sodium chloride, acetaminophen, ALPRAZolam, melatonin, nitroGLYCERIN, ondansetron (ZOFRAN) IV, sodium chloride flush, zolpidem   Vital Signs    Vitals:   10/18/21 0700 10/18/21 0730 10/18/21 0800 10/18/21 0815  BP: 94/63 105/69 (!) 81/62 (!) 88/69  Pulse: 75 94 64 73  Resp: '18 16 15 19  '$ Temp:      TempSrc:      SpO2: 98% 100% 98% 96%  Weight:      Height:       No intake or output data in the 24 hours ending 10/18/21 0911    10/17/2021    4:36 PM 10/11/2021    9:09 AM 09/27/2021    8:57 AM  Last 3 Weights  Weight (lbs) 198 lb 202 lb 1.9 oz 205 lb 12.8 oz  Weight (kg) 89.812 kg 91.681 kg 93.35 kg      Telemetry    Atrial fibrillation converting to sinus - Personally Reviewed  ECG    Sinus with pacs - Personally Reviewed  Physical Exam   GEN: No acute distress.   Neck: No JVD Cardiac: RRR, no murmurs, rubs, or gallops.  Respiratory: Clear to auscultation bilaterally. GI: Soft, nontender, non-distended  MS: No edema Neuro:  Nonfocal  Psych: Normal affect   Labs     Chemistry Recent Labs  Lab 10/17/21 1753 10/18/21 0340  NA 143  --   K 3.4*  --   CL 112*  --   CO2 22  --   GLUCOSE 125*  --   BUN 16  --   CREATININE 0.91  --   CALCIUM 9.3  --   MG 2.0  --   PROT  --  5.4*  ALBUMIN  --  3.7  AST  --  33  ALT  --   23  ALKPHOS  --  28*  BILITOT  --  0.7  GFRNONAA >60  --   ANIONGAP 9  --      Hematology Recent Labs  Lab 10/17/21 1753  WBC 10.1  RBC 4.32  HGB 12.7*  HCT 37.5*  MCV 86.8  MCH 29.4  MCHC 33.9  RDW 18.5*  PLT 411*   Thyroid  Recent Labs  Lab 10/17/21 1815  TSH 1.713      Patient Profile     66 y.o. male with paroxysmal atrial fibrillation, prior CVA, hypertension, polycythemia vera, mildly elevated calcium score with recurrent atrial fibrillation.  Echocardiogram May 2023 showed normal LV function, grade 1 diastolic dysfunction, mild right ventricular enlargement and mildly dilated aortic root.  Assessment & Plan    1 Paroxysmal atrial fibrillation-patient presented with recurrent atrial fibrillation.  He has converted to sinus rhythm.  We will continue aspirin and apixaban (  aspirin added previously as CVA occurred on apixaban).  Change Cardizem to CD1 120 mg daily.  Continue propranolol.  He has had short burst of atrial fibrillation in the past.  I will have him seen for consideration of atrial fibrillation ablation as an outpatient.  I will see him back in the office in 3 months.  2 hypertension-patient's blood pressure is borderline.  Given addition of Cardizem we will discontinue losartan.  Follow blood pressure as an outpatient and adjust as needed.  3 hyperlipidemia-continue outpatient statin therapy.  4 dyspnea on exertion-he is scheduled for cardiac MRI in August as he has a history of peripheral neuropathy, bilateral carpal tunnel syndrome and amyloid needs to be excluded.  5 history of mildly elevated calcium score-continue statin.  Greater than 30 minutes PA and physician time. D2  For questions or updates, please contact [place] Please consult [email or web address] for contact info under        Signed, Kirk Ruths, MD  10/18/2021, 9:11 AM

## 2021-10-20 ENCOUNTER — Other Ambulatory Visit (HOSPITAL_COMMUNITY): Payer: Self-pay

## 2021-10-26 ENCOUNTER — Encounter: Payer: Self-pay | Admitting: Cardiology

## 2021-10-30 ENCOUNTER — Other Ambulatory Visit (HOSPITAL_COMMUNITY): Payer: Self-pay

## 2021-10-31 ENCOUNTER — Ambulatory Visit: Payer: Managed Care, Other (non HMO) | Admitting: Cardiology

## 2021-11-08 ENCOUNTER — Other Ambulatory Visit (HOSPITAL_COMMUNITY): Payer: Self-pay

## 2021-11-08 MED ORDER — DILTIAZEM HCL ER COATED BEADS 120 MG PO CP24
120.0000 mg | ORAL_CAPSULE | Freq: Every day | ORAL | 3 refills | Status: DC
  Filled 2021-11-08 – 2021-11-10 (×2): qty 90, 90d supply, fill #0
  Filled 2022-01-30: qty 90, 90d supply, fill #1

## 2021-11-10 ENCOUNTER — Other Ambulatory Visit (HOSPITAL_COMMUNITY): Payer: Self-pay

## 2021-11-11 ENCOUNTER — Other Ambulatory Visit (HOSPITAL_COMMUNITY): Payer: Self-pay

## 2021-11-21 ENCOUNTER — Other Ambulatory Visit (HOSPITAL_COMMUNITY): Payer: Self-pay

## 2021-11-22 ENCOUNTER — Inpatient Hospital Stay: Payer: Managed Care, Other (non HMO) | Attending: Hematology & Oncology

## 2021-11-22 ENCOUNTER — Encounter: Payer: Self-pay | Admitting: Hematology & Oncology

## 2021-11-22 ENCOUNTER — Inpatient Hospital Stay: Payer: Managed Care, Other (non HMO) | Admitting: Hematology & Oncology

## 2021-11-22 VITALS — BP 124/63 | HR 67 | Temp 98.3°F | Resp 20 | Ht 71.0 in | Wt 196.4 lb

## 2021-11-22 DIAGNOSIS — I4891 Unspecified atrial fibrillation: Secondary | ICD-10-CM | POA: Diagnosis not present

## 2021-11-22 DIAGNOSIS — Z7901 Long term (current) use of anticoagulants: Secondary | ICD-10-CM | POA: Insufficient documentation

## 2021-11-22 DIAGNOSIS — D45 Polycythemia vera: Secondary | ICD-10-CM | POA: Insufficient documentation

## 2021-11-22 DIAGNOSIS — D751 Secondary polycythemia: Secondary | ICD-10-CM

## 2021-11-22 DIAGNOSIS — D500 Iron deficiency anemia secondary to blood loss (chronic): Secondary | ICD-10-CM

## 2021-11-22 DIAGNOSIS — E611 Iron deficiency: Secondary | ICD-10-CM | POA: Diagnosis not present

## 2021-11-22 LAB — CBC WITH DIFFERENTIAL (CANCER CENTER ONLY)
Abs Immature Granulocytes: 0.11 10*3/uL — ABNORMAL HIGH (ref 0.00–0.07)
Basophils Absolute: 0.1 10*3/uL (ref 0.0–0.1)
Basophils Relative: 1 %
Eosinophils Absolute: 0.6 10*3/uL — ABNORMAL HIGH (ref 0.0–0.5)
Eosinophils Relative: 5 %
HCT: 40.4 % (ref 39.0–52.0)
Hemoglobin: 13.3 g/dL (ref 13.0–17.0)
Immature Granulocytes: 1 %
Lymphocytes Relative: 7 %
Lymphs Abs: 1 10*3/uL (ref 0.7–4.0)
MCH: 28.7 pg (ref 26.0–34.0)
MCHC: 32.9 g/dL (ref 30.0–36.0)
MCV: 87.1 fL (ref 80.0–100.0)
Monocytes Absolute: 1 10*3/uL (ref 0.1–1.0)
Monocytes Relative: 7 %
Neutro Abs: 11.1 10*3/uL — ABNORMAL HIGH (ref 1.7–7.7)
Neutrophils Relative %: 79 %
Platelet Count: 619 10*3/uL — ABNORMAL HIGH (ref 150–400)
RBC: 4.64 MIL/uL (ref 4.22–5.81)
RDW: 18.5 % — ABNORMAL HIGH (ref 11.5–15.5)
WBC Count: 13.9 10*3/uL — ABNORMAL HIGH (ref 4.0–10.5)
nRBC: 0.3 % — ABNORMAL HIGH (ref 0.0–0.2)

## 2021-11-22 LAB — CMP (CANCER CENTER ONLY)
ALT: 23 U/L (ref 0–44)
AST: 29 U/L (ref 15–41)
Albumin: 5.2 g/dL — ABNORMAL HIGH (ref 3.5–5.0)
Alkaline Phosphatase: 34 U/L — ABNORMAL LOW (ref 38–126)
Anion gap: 11 (ref 5–15)
BUN: 28 mg/dL — ABNORMAL HIGH (ref 8–23)
CO2: 24 mmol/L (ref 22–32)
Calcium: 9.8 mg/dL (ref 8.9–10.3)
Chloride: 107 mmol/L (ref 98–111)
Creatinine: 1.12 mg/dL (ref 0.61–1.24)
GFR, Estimated: >60 mL/min (ref >60)
Glucose, Bld: 131 mg/dL — ABNORMAL HIGH (ref 70–99)
Potassium: 3.4 mmol/L — ABNORMAL LOW (ref 3.5–5.1)
Sodium: 142 mmol/L (ref 135–145)
Total Bilirubin: 1.4 mg/dL — ABNORMAL HIGH (ref 0.3–1.2)
Total Protein: 6.8 g/dL (ref 6.5–8.1)

## 2021-11-22 LAB — FERRITIN: Ferritin: 62 ng/mL (ref 24–336)

## 2021-11-22 LAB — LACTATE DEHYDROGENASE: LDH: 521 U/L — ABNORMAL HIGH (ref 98–192)

## 2021-11-22 NOTE — Progress Notes (Signed)
Hematology and Oncology Follow Up Visit  [name] [name] [id number] [birth year] 66 y.o. 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  Apparently, he was in the hospital for about a day.  This was about a month or so ago.  He had atrial fibrillation.  He was put on Cardizem.  He does see cardiology.  He is feeling a little better right now.  He thankfully has been on Eliquis.  A little bit surprised that he had the issues with the atrial fibrillation.  He is on Inderal.  Actually, I think he was put on this back in June.  His other problem is that he has carpal tunnel with the left wrist.  He has had steroid injections.  Also, there might be a arthritic issue with his left thumb.  His platelet count is on the high side right now.  Again I am a little surprised by this.  This may be reactionary.  He is taking Jakafi.  Is doing well on the [name] [name].  His platelet count has been holding pretty steady on Jakafi.  He has had no fever.  He has had no cough.  He has had no nausea or vomiting.  Has had no leg swelling.  He had occasional pain in the left side of his abdomen.  There is been no headache.  Overall, I would say his performance status is probably ECOG 1.    Medications:  Allergies as of 11/22/2021   No Known Allergies      Medication List        Accurate as of November 22, 2021  4:06 PM. If you have any questions, ask your nurse or doctor.          acetaminophen 500 MG tablet Commonly known as: TYLENOL Take  1,000 mg by mouth every 6 (six) hours as needed for mild pain.   aspirin EC 81 MG tablet Take 1 tablet (81 mg total) by mouth daily.   diltiazem 120 MG 24 hr capsule Commonly known as: CARDIZEM CD Take 1 capsule (120 mg total) by mouth daily.   Eliquis 5 MG Tabs tablet Generic drug: apixaban Take 1 tablet (5 mg total) by mouth 2 (two) times daily.   fluorouracil 5 % cream Commonly known as: EFUDEX Apply 1 application  topically 2 (two) times daily as needed (dry skin).   gabapentin 300 MG capsule Commonly known as: NEURONTIN Take 1 capsule (300 mg total) by mouth 4 (four) times daily. What changed:  how much to take when to take this   Jakafi 10 MG tablet Generic drug: ruxolitinib phosphate TAKE 1 TABLET BY MOUTH 2 TIMES DAILY.   melatonin 5 MG Tabs Take 5 mg by mouth at bedtime.   Mens Multivitamin Tabs Take 1 tablet by mouth daily.   NON FORMULARY Take 1 tablet by mouth every evening. Nervive ( for neuropathy)OTC   propranolol ER 160 MG SR capsule Commonly known as: INDERAL LA Take 1 capsule (160 mg total) by mouth daily.  rosuvastatin 20 MG tablet Commonly known as: CRESTOR Take 1 tablet (20 mg total) by mouth daily.   tadalafil 5 MG tablet Commonly known as: CIALIS Take 1 tablet by mouth daily as needed for erectile dysfunction.   triamcinolone acetonide 40 MG/ML Susp Commonly known as: TRIESENCE Inject into the articular space.   zolpidem 12.5 MG CR tablet Commonly known as: AMBIEN CR Take 6.25 mg by mouth at bedtime as needed for sleep.        Allergies:  No Known Allergies   Past Medical History, Surgical history, Social history, and Family History were reviewed and updated.  Review of Systems: Review of Systems  Constitutional: Negative.   HENT: Negative.    Eyes: Negative.   Respiratory: Negative.    Cardiovascular: Negative.   Gastrointestinal: Negative.   Genitourinary: Negative.   Musculoskeletal: Negative.   Skin:  Positive  for rash.  Neurological: Negative.   Endo/Heme/Allergies: Negative.   Psychiatric/Behavioral: Negative.       Physical Exam:  height is '5\' 11"'$  (1.803 m) and weight is 196 lb 6.4 oz (89.1 kg). His oral temperature is 98.3 F (36.8 C). His blood pressure is 124/63 and his pulse is 67. His respiration is 20 and oxygen saturation is 96%.   Wt Readings from Last 3 Encounters:  11/22/21 196 lb 6.4 oz (89.1 kg)  10/17/21 198 lb (89.8 kg)  10/11/21 202 lb 1.9 oz (91.7 kg)    Physical Exam Vitals reviewed.  HENT:     Head: Normocephalic and atraumatic.  Eyes:     Pupils: Pupils are equal, round, and reactive to light.  Cardiovascular:     Rate and Rhythm: Normal rate and regular rhythm.     Heart sounds: Normal heart sounds.  Pulmonary:     Effort: Pulmonary effort is normal.     Breath sounds: Normal breath sounds.  Abdominal:     General: Bowel sounds are normal.     Palpations: Abdomen is soft.     Comments: I cannot palpate his spleen.  Musculoskeletal:        General: No tenderness or deformity. Normal range of motion.     Cervical back: Normal range of motion.  Lymphadenopathy:     Cervical: No cervical adenopathy.  Skin:    General: Skin is warm and dry.     Findings: No erythema or rash.     Comments: He has the healing zoster rash in the left T7 dermatome.  There is some sensitivity to touch in this area.  Neurological:     Mental Status: He is alert and oriented to person, place, and time.  Psychiatric:        Behavior: Behavior normal.        Thought Content: Thought content normal.        Judgment: Judgment normal.     Lab Results  Component Value Date   WBC 13.9 (H) 11/22/2021   HGB 13.3 11/22/2021   HCT 40.4 11/22/2021   MCV 87.1 11/22/2021   PLT 619 (H) 11/22/2021   Lab Results  Component Value Date   FERRITIN 207 09/27/2021   IRON 156 09/27/2021   TIBC 412 09/27/2021   UIBC 256 09/27/2021   IRONPCTSAT 38 09/27/2021   Lab Results  Component  Value Date   RETICCTPCT 3.6 (H) 01/18/2021   RBC 4.64 11/22/2021   RETICCTABS 87.6 02/21/2011   Lab Results  Component Value Date   KPAFRELGTCHN 10.7 09/27/2021   LAMBDASER 8.5 09/27/2021  KAPLAMBRATIO 1.26 09/27/2021   No results found for: "IGGSERUM", "IGA", "IGMSERUM" No results found for: "TOTALPROTELP", "ALBUMINELP", "A1GS", "A2GS", "BETS", "BETA2SER", "GAMS", "MSPIKE", "SPEI"   Chemistry      Component Value Date/Time   NA 142 11/22/2021 1513   NA 144 04/25/2017 1506   NA 139 04/12/2016 1113   K 3.4 (L) 11/22/2021 1513   K 4.2 04/25/2017 1506   K 4.1 04/12/2016 1113   CL 107 11/22/2021 1513   CL 105 04/25/2017 1506   CO2 24 11/22/2021 1513   CO2 29 04/25/2017 1506   CO2 22 04/12/2016 1113   BUN 28 (H) 11/22/2021 1513   BUN 17 04/25/2017 1506   BUN 16.3 04/12/2016 1113   CREATININE 1.12 11/22/2021 1513   CREATININE 1.2 04/25/2017 1506   CREATININE 0.9 04/12/2016 1113      Component Value Date/Time   CALCIUM 9.8 11/22/2021 1513   CALCIUM 9.5 04/25/2017 1506   CALCIUM 9.1 04/12/2016 1113   ALKPHOS 34 (L) 11/22/2021 1513   ALKPHOS 73 04/25/2017 1506   ALKPHOS 66 04/12/2016 1113   AST 29 11/22/2021 1513   AST 31 04/12/2016 1113   ALT 23 11/22/2021 1513   ALT 58 (H) 04/25/2017 1506   ALT 36 04/12/2016 1113   BILITOT 1.4 (H) 11/22/2021 1513   BILITOT 0.73 04/12/2016 1113       Impression and Plan: Mr. Brouwer is a very pleasant 66 yo caucasian gentleman with polycythemia vera, JAK2 positive.   Again, I just hate the fact that his platelet count is up.  I looked at his blood smear.  His platelets are on the higher side.  He has a several large platelets.  I saw a couple nucleated red blood cells.  I just do not want to make a change with the [facility] for right now.  We can always increase the Jakafi if necessary.  Maybe, the elevated thrombocytosis could be from his steroid injections into the wrist.  I would like to just see how things trend.  I would like to  get him back in about 3 weeks or so.  If, at that time, his platelet count is no better, then we will increase his Jakafi to 15 mg p.o. twice daily.  His last ultrasound of the spleen was back in April.  His spleen was a little bit better with a volume of 860 cm.    [name], MD 7/19/20234:06 PM

## 2021-11-23 LAB — IRON AND IRON BINDING CAPACITY (CC-WL,HP ONLY)
Iron: 185 ug/dL — ABNORMAL HIGH (ref 45–182)
Saturation Ratios: 43 % — ABNORMAL HIGH (ref 17.9–39.5)
TIBC: 428 ug/dL (ref 250–450)
UIBC: 243 ug/dL (ref 117–376)

## 2021-11-29 ENCOUNTER — Other Ambulatory Visit (HOSPITAL_COMMUNITY): Payer: Self-pay

## 2021-12-04 ENCOUNTER — Telehealth: Payer: Self-pay | Admitting: Cardiology

## 2021-12-04 ENCOUNTER — Ambulatory Visit: Payer: Managed Care, Other (non HMO) | Admitting: Cardiology

## 2021-12-04 ENCOUNTER — Encounter: Payer: Self-pay | Admitting: Cardiology

## 2021-12-04 ENCOUNTER — Encounter: Payer: Self-pay | Admitting: *Deleted

## 2021-12-04 VITALS — BP 116/74 | HR 56 | Ht 71.0 in | Wt 200.4 lb

## 2021-12-04 DIAGNOSIS — D6869 Other thrombophilia: Secondary | ICD-10-CM

## 2021-12-04 DIAGNOSIS — I480 Paroxysmal atrial fibrillation: Secondary | ICD-10-CM | POA: Diagnosis not present

## 2021-12-04 NOTE — Telephone Encounter (Signed)
Aware 12/8 still available. Informed that I would be in touch at later date to schedule the ablation and go over instructions. Patient verbalized understanding and agreeable to plan.

## 2021-12-04 NOTE — Progress Notes (Addendum)
Electrophysiology Office Note   Date:  12/04/2021   ID:  [name] [name], DOB 1955/07/19, MRN [id number]  PCP:  Donnajean Lopes, MD  Cardiologist:  Stanford Breed Primary Electrophysiologist:  Kesean Serviss [name], MD    Chief Complaint: AF   History of Present Illness: [name] [name] is a 66 y.o. male who is being seen today for the evaluation of AF at the request of Donnajean Lopes, MD. Presenting today for electrophysiology evaluation.  He has a history significant for hypertension, hyperlipidemia, CVA, atrial fibrillation.  His CVA was May 2020.  It was thought that this was due to fibromuscular dysplasia.  He presented to the hospital 10/17/2021 with rapid atrial fibrillation.  He had a sudden onset brief cramping in his left upper chest.  He developed palpitations and started feeling poorly.  His heart rate was 152 bpm.  He converted to sinus rhythm while in the hospital without need for intervention.  Today, he denies symptoms of palpitations, chest pain, shortness of breath, orthopnea, PND, lower extremity edema, claudication, dizziness, presyncope, syncope, bleeding, or neurologic sequela. The patient is tolerating medications without difficulties.    Past Medical History:  Diagnosis Date   Diverticulosis of colon (without mention of hemorrhage)    Hemorrhoids    Hypertension    IBS (irritable bowel syndrome)    Iron deficiency anemia due to chronic blood loss 09/26/2017   Irregular heartbeat    Nephrolithiasis    Other and unspecified hyperlipidemia    Perirectal fistula    Polycythemia, secondary    Stroke (HCC)    Stye    blocker duct right eye lid   Past Surgical History:  Procedure Laterality Date   RETINAL DETACHMENT SURGERY     ROTATOR CUFF REPAIR     rt.   VASECTOMY       Current Outpatient Medications  Medication Sig Dispense Refill   acetaminophen (TYLENOL) 500 MG tablet Take 1,000 mg by mouth every 6 (six) hours as needed for mild pain.      apixaban  (ELIQUIS) 5 MG TABS tablet Take 1 tablet (5 mg total) by mouth 2 (two) times daily. 180 tablet 3   aspirin EC 81 MG EC tablet Take 1 tablet (81 mg total) by mouth daily. 60 tablet 0   diltiazem (CARDIZEM CD) 120 MG 24 hr capsule Take 1 capsule (120 mg total) by mouth daily. 90 capsule 3   fluorouracil (EFUDEX) 5 % cream Apply 1 application  topically 2 (two) times daily as needed (dry skin).     gabapentin (NEURONTIN) 300 MG capsule Take 1 capsule (300 mg total) by mouth 4 (four) times daily. (Patient taking differently: Take 600 mg by mouth 2 (two) times daily.) 360 capsule 3   melatonin 5 MG TABS Take 5 mg by mouth at bedtime.     Multiple Vitamins-Minerals (MENS MULTIVITAMIN) TABS Take 1 tablet by mouth daily.     propranolol ER (INDERAL LA) 160 MG SR capsule Take 1 capsule (160 mg total) by mouth daily. 90 capsule 3   rosuvastatin (CRESTOR) 20 MG tablet Take 1 tablet (20 mg total) by mouth daily. 90 tablet 3   ruxolitinib phosphate (JAKAFI) 10 MG tablet TAKE 1 TABLET BY MOUTH 2 TIMES DAILY. 60 tablet 4   tadalafil (CIALIS) 5 MG tablet Take 1 tablet by mouth daily as needed for erectile dysfunction.     zolpidem (AMBIEN CR) 12.5 MG CR tablet Take 6.25 mg by mouth at bedtime as needed for sleep.  NON FORMULARY Take 1 tablet by mouth every evening. Nervive ( for neuropathy)OTC (Patient not taking: Reported on 12/04/2021)     triamcinolone acetonide (TRIESENCE) 40 MG/ML SUSP Inject into the articular space. (Patient not taking: Reported on 12/04/2021)     No current facility-administered medications for this visit.    Allergies:   Patient has no known allergies.   Social History:  The patient  reports that he has never smoked. He has never used smokeless tobacco. He reports current alcohol use. He reports that he does not use drugs.   Family History:  The patient's family history includes Aortic aneurysm in his father; Cerebral palsy in his brother; Heart attack in his brother.    ROS:   Please see the history of present illness.   Otherwise, review of systems is positive for none.   All other systems are reviewed and negative.    PHYSICAL EXAM: VS:  BP 116/74   Pulse (!) 56   Ht '5\' 11"'$  (1.803 m)   Wt 200 lb 6.4 oz (90.9 kg)   SpO2 97%   BMI 27.95 kg/m  , BMI Body mass index is 27.95 kg/m. GEN: Well nourished, well developed, in no acute distress  HEENT: normal  Neck: no JVD, carotid bruits, or masses Cardiac: RRR; no murmurs, rubs, or gallops,no edema  Respiratory:  clear to auscultation bilaterally, normal work of breathing GI: soft, nontender, nondistended, + BS MS: no deformity or atrophy  Skin: warm and dry Neuro:  Strength and sensation are intact Psych: euthymic mood, full affect  EKG:  EKG is ordered today. Personal review of the ekg ordered shows sinus rhythm, rate 56  Recent Labs: 10/17/2021: Magnesium 2.0; TSH 1.713 11/22/2021: ALT 23; BUN 28; Creatinine 1.12; Hemoglobin 13.3; Platelet Count 619; Potassium 3.4; Sodium 142    Lipid Panel     Component Value Date/Time   CHOL 142 09/13/2018 0634   TRIG 203 (H) 09/13/2018 0634   HDL 25 (L) 09/13/2018 0634   CHOLHDL 5.7 09/13/2018 0634   VLDL 41 (H) 09/13/2018 0634   LDLCALC 76 09/13/2018 0634     Wt Readings from Last 3 Encounters:  12/04/21 200 lb 6.4 oz (90.9 kg)  11/22/21 196 lb 6.4 oz (89.1 kg)  10/17/21 198 lb (89.8 kg)      Other studies Reviewed: Additional studies/ records that were reviewed today include: TTE 09/07/21  Review of the above records today demonstrates:   1. Left ventricular ejection fraction, by estimation, is 55 to 60%. Left  ventricular ejection fraction by 3D volume is 58 %. The left ventricle has  normal function. The left ventricle has no regional wall motion  abnormalities. Left ventricular diastolic   parameters are consistent with Grade I diastolic dysfunction (impaired  relaxation). The average left ventricular global longitudinal strain is  -21.3 %. The  global longitudinal strain is normal.   2. Right ventricular systolic function is normal. The right ventricular  size is mildly enlarged. Tricuspid regurgitation signal is inadequate for  assessing PA pressure.   3. The mitral valve is normal in structure. Trivial mitral valve  regurgitation. No evidence of mitral stenosis.   4. The aortic valve is normal in structure. Aortic valve regurgitation is  not visualized. No aortic stenosis is present.   5. Aortic dilatation noted. There is mild dilatation of the ascending  aorta, measuring 39 mm.    ASSESSMENT AND PLAN:  1.  Paroxysmal atrial fibrillation: Currently on Eliquis 5 mg twice daily.  CHA2DS2-VASc of 3.  Since his hospitalization he has continued to have short episodes of what he feels is atrial fibrillation but nothing quite as prolonged or symptomatic.  He would prefer to stay in we Cristo Ausburn give him information on ablation, though I do think he Henrine Hayter agree to the procedure.  Risk, benefits, and alternatives to EP study and radiofrequency ablation for afib were also discussed in detail today. These risks include but are not limited to stroke, bleeding, vascular damage, tamponade, perforation, damage to the esophagus, lungs, and other structures, pulmonary vein stenosis, worsening renal function, and death. The patient understands these risk and wishes to proceed.  We Rommie Dunn therefore proceed with catheter ablation at the next available time.  Carto, ICE, anesthesia are requested for the procedure.  Breylin Dom also obtain CT PV protocol prior to the procedure to exclude LAA thrombus and further evaluate atrial anatomy.   2.  Hypertension: Currently well controlled  3.  Hyperlipidemia: Continue statin per primary cardiology  4.  Secondary hypercoagulable state: Currently on Eliquis for atrial fibrillation as above  Case discussed with primary cardiology  Current medicines are reviewed at length with the patient today.   The patient does not have  concerns regarding his medicines.  The following changes were made today:  none  Labs/ tests ordered today include:  Orders Placed This Encounter  Procedures   EKG 12-Lead     Disposition:   FU with Tamber Burtch decision on ablation d  Signed, Merrell Rettinger [name], MD  12/04/2021 9:33 AM     [address] [address] [phone] (office) [phone] (fax)

## 2021-12-04 NOTE — Patient Instructions (Addendum)
Medication Instructions:  Your physician recommends that you continue on your current medications as directed. Please refer to the Current Medication list given to you today.  *If you need a refill on your cardiac medications before your next appointment, please call your pharmacy*   Lab Work: None ordered  If you have labs (blood work) drawn today and your tests are completely normal, you will receive your results only by: [facility] (if you have MyChart) OR A paper copy in the mail If you have any lab test that is abnormal or we need to change your treatment, we will call you to review the results.   Testing/Procedures: Your physician has recommended that you have an ablation. Catheter ablation is a medical procedure used to treat some cardiac arrhythmias (irregular heartbeats). During catheter ablation, a long, thin, flexible tube is put into a blood vessel in your groin (upper thigh), or neck. This tube is called an ablation catheter. It is then guided to your heart through the blood vessel. Radio frequency waves destroy small areas of heart tissue where abnormal heartbeats may cause an arrhythmia to start.   Please call the office if/when you would like to schedule this procedure.   Follow-Up: At [facility], you and your health needs are our priority.  As part of our continuing mission to provide you with exceptional heart care, we have created designated Provider Care Teams.  These Care Teams include your primary Cardiologist (physician) and Advanced Practice Providers (APPs -  Physician Assistants and Nurse Practitioners) who all work together to provide you with the care you need, when you need it.  Your next appointment:   To be determined  The format for your next appointment:   In Person  Provider:   Will [place]   Thank you for choosing [facility]!!   Trinidad Curet, RN [phone]    Other Instructions  Cardiac Ablation Cardiac ablation is a  procedure to destroy (ablate) some heart tissue that is sending bad signals. These bad signals cause problems in heart rhythm. The heart has many areas that make these signals. If there are problems in these areas, they can make the heart beat in a way that is not normal. Destroying some tissues can help make the heart rhythm normal. Tell your doctor about: Any allergies you have. All medicines you are taking. These include vitamins, herbs, eye drops, creams, and over-the-counter medicines. Any problems you or family members have had with medicines that make you fall asleep (anesthetics). Any blood disorders you have. Any surgeries you have had. Any medical conditions you have, such as kidney failure. Whether you are pregnant or may be pregnant. What are the risks? This is a safe procedure. But problems may occur, including: Infection. Bruising and bleeding. Bleeding into the chest. Stroke or blood clots. Damage to nearby areas of your body. Allergies to medicines or dyes. The need for a pacemaker if the normal system is damaged. Failure of the procedure to treat the problem. What happens before the procedure? Medicines Ask your doctor about: Changing or stopping your normal medicines. This is important. Taking aspirin and ibuprofen. Do not take these medicines unless your doctor tells you to take them. Taking other medicines, vitamins, herbs, and supplements. General instructions Follow instructions from your doctor about what you cannot eat or drink. Plan to have someone take you home from the hospital or clinic. If you will be going home right after the procedure, plan to have someone with you for  24 hours. Ask your doctor what steps will be taken to prevent infection. What happens during the procedure?  An IV tube will be put into one of your veins. You will be given a medicine to help you relax. The skin on your neck or groin will be numbed. A cut (incision) will be made in  your neck or groin. A needle will be put through your cut and into a large vein. A tube (catheter) will be put into the needle. The tube will be moved to your heart. Dye may be put through the tube. This helps your doctor see your heart. Small devices (electrodes) on the tube will send out signals. A type of energy will be used to destroy some heart tissue. The tube will be taken out. Pressure will be held on your cut. This helps stop bleeding. A bandage will be put over your cut. The exact procedure may vary among doctors and hospitals. What happens after the procedure? You will be watched until you leave the hospital or clinic. This includes checking your heart rate, breathing rate, oxygen, and blood pressure. Your cut will be watched for bleeding. You will need to lie still for a few hours. Do not drive for 24 hours or as long as your doctor tells you. Summary Cardiac ablation is a procedure to destroy some heart tissue. This is done to treat heart rhythm problems. Tell your doctor about any medical conditions you may have. Tell him or her about all medicines you are taking to treat them. This is a safe procedure. But problems may occur. These include infection, bruising, bleeding, and damage to nearby areas of your body. Follow what your doctor tells you about food and drink. You may also be told to change or stop some of your medicines. After the procedure, do not drive for 24 hours or as long as your doctor tells you. This information is not intended to replace advice given to you by your health care provider. Make sure you discuss any questions you have with your health care provider. Document Revised: 03/26/2019 Document Reviewed: 03/26/2019 Elsevier Patient Education  [place].

## 2021-12-04 NOTE — Telephone Encounter (Signed)
  Per MyChart scheduling message:   Alden Server, this is [employer].  I saw Dr. Curt Bears this morning and we spoke afterwards about scheduling an ablation procedure.  If a slot is still open, I would like the earliest possible time for Friday December 8.  Please let me know if this works.  Thank you!

## 2021-12-19 ENCOUNTER — Other Ambulatory Visit (HOSPITAL_COMMUNITY): Payer: Self-pay

## 2021-12-21 ENCOUNTER — Other Ambulatory Visit (HOSPITAL_COMMUNITY): Payer: Self-pay

## 2021-12-22 ENCOUNTER — Inpatient Hospital Stay: Payer: Managed Care, Other (non HMO) | Attending: Hematology & Oncology

## 2021-12-22 ENCOUNTER — Inpatient Hospital Stay: Payer: Managed Care, Other (non HMO) | Admitting: Hematology & Oncology

## 2021-12-22 ENCOUNTER — Other Ambulatory Visit: Payer: Self-pay | Admitting: Cardiology

## 2021-12-22 ENCOUNTER — Other Ambulatory Visit: Payer: Self-pay

## 2021-12-22 ENCOUNTER — Encounter: Payer: Self-pay | Admitting: Hematology & Oncology

## 2021-12-22 VITALS — BP 109/60 | HR 56 | Temp 98.2°F | Resp 18 | Wt 200.0 lb

## 2021-12-22 DIAGNOSIS — I4891 Unspecified atrial fibrillation: Secondary | ICD-10-CM | POA: Insufficient documentation

## 2021-12-22 DIAGNOSIS — D751 Secondary polycythemia: Secondary | ICD-10-CM | POA: Diagnosis not present

## 2021-12-22 DIAGNOSIS — D45 Polycythemia vera: Secondary | ICD-10-CM | POA: Diagnosis present

## 2021-12-22 DIAGNOSIS — Z7901 Long term (current) use of anticoagulants: Secondary | ICD-10-CM | POA: Diagnosis not present

## 2021-12-22 LAB — CBC WITH DIFFERENTIAL (CANCER CENTER ONLY)
Abs Immature Granulocytes: 0.1 10*3/uL — ABNORMAL HIGH (ref 0.00–0.07)
Basophils Absolute: 0.1 10*3/uL (ref 0.0–0.1)
Basophils Relative: 1 %
Eosinophils Absolute: 0.4 10*3/uL (ref 0.0–0.5)
Eosinophils Relative: 4 %
HCT: 37.7 % — ABNORMAL LOW (ref 39.0–52.0)
Hemoglobin: 12.6 g/dL — ABNORMAL LOW (ref 13.0–17.0)
Immature Granulocytes: 1 %
Lymphocytes Relative: 11 %
Lymphs Abs: 0.9 10*3/uL (ref 0.7–4.0)
MCH: 29 pg (ref 26.0–34.0)
MCHC: 33.4 g/dL (ref 30.0–36.0)
MCV: 86.9 fL (ref 80.0–100.0)
Monocytes Absolute: 0.7 10*3/uL (ref 0.1–1.0)
Monocytes Relative: 8 %
Neutro Abs: 6.3 10*3/uL (ref 1.7–7.7)
Neutrophils Relative %: 75 %
Platelet Count: 431 10*3/uL — ABNORMAL HIGH (ref 150–400)
RBC: 4.34 MIL/uL (ref 4.22–5.81)
RDW: 17.4 % — ABNORMAL HIGH (ref 11.5–15.5)
WBC Count: 8.5 10*3/uL (ref 4.0–10.5)
nRBC: 0.4 % — ABNORMAL HIGH (ref 0.0–0.2)

## 2021-12-22 LAB — CMP (CANCER CENTER ONLY)
ALT: 21 U/L (ref 0–44)
AST: 26 U/L (ref 15–41)
Albumin: 4.9 g/dL (ref 3.5–5.0)
Alkaline Phosphatase: 35 U/L — ABNORMAL LOW (ref 38–126)
Anion gap: 8 (ref 5–15)
BUN: 18 mg/dL (ref 8–23)
CO2: 27 mmol/L (ref 22–32)
Calcium: 9.5 mg/dL (ref 8.9–10.3)
Chloride: 107 mmol/L (ref 98–111)
Creatinine: 1.11 mg/dL (ref 0.61–1.24)
GFR, Estimated: >60 mL/min (ref >60)
Glucose, Bld: 109 mg/dL — ABNORMAL HIGH (ref 70–99)
Potassium: 3.9 mmol/L (ref 3.5–5.1)
Sodium: 142 mmol/L (ref 135–145)
Total Bilirubin: 0.8 mg/dL (ref 0.3–1.2)
Total Protein: 6.8 g/dL (ref 6.5–8.1)

## 2021-12-22 LAB — SAVE SMEAR(SSMR), FOR PROVIDER SLIDE REVIEW

## 2021-12-22 LAB — LACTATE DEHYDROGENASE: LDH: 471 U/L — ABNORMAL HIGH (ref 98–192)

## 2021-12-22 LAB — FERRITIN: Ferritin: 55 ng/mL (ref 24–336)

## 2021-12-22 NOTE — Progress Notes (Signed)
Hematology and Oncology Follow Up Visit  [name] [name] [id number] 06/30/1955 66 y.o. 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 Frampton is here today for follow-up.  He is doing pretty well.  He and his wife will be heading up to [place], [place] next week to visit one of their children.  He does have some bruising.  He is on Eliquis.  His platelet counts come down nicely.  We did not make any changes with his medications.  He is still on the [place].  His platelet count is now 431,000.  He has had no cough or shortness of breath.  There has been no abdominal pain.  He still has some problems with urine.  I am sure he probably has enlarged prostate.  He has had no problems with respect to the carpal tunnel.  He does have the atrial fibrillation.  Apparently, there is some consideration for ablation.  He has seen cardiology for this.  If this is going to be done, it will be done in December.  He has had no fever.  He has had no mouth sores.  He has had no leg swelling.  Overall, I would say his performance status is probably ECOG 1.     Medications:  Allergies as of 12/22/2021       Reactions   Mirtazapine    Other reaction(s): too sleepy   Niacin    Other reaction(s): dysegthesias   Trazodone Hcl    Other reaction(s): too sleepy in AM        Medication List        Accurate as of December 22, 2021 10:31 AM. If you have any questions, ask your nurse or doctor.          acetaminophen 500 MG tablet Commonly known as: TYLENOL Take  1,000 mg by mouth every 6 (six) hours as needed for mild pain.   aspirin EC 81 MG tablet Take 1 tablet (81 mg total) by mouth daily.   diltiazem 120 MG 24 hr capsule Commonly known as: CARDIZEM CD Take 1 capsule (120 mg total) by mouth daily.   Eliquis 5 MG Tabs tablet Generic drug: apixaban Take 1 tablet (5 mg total) by mouth 2 (two) times daily.   fluorouracil 5 % cream Commonly known as: EFUDEX Apply 1 application  topically 2 (two) times daily as needed (dry skin).   gabapentin 300 MG capsule Commonly known as: NEURONTIN Take 1 capsule (300 mg total) by mouth 4 (four) times daily. What changed:  how much to take when to take this   Jakafi 10 MG tablet Generic drug: ruxolitinib phosphate TAKE 1 TABLET BY MOUTH 2 TIMES DAILY.   melatonin 5 MG Tabs Take 5 mg by mouth at bedtime.   Mens Multivitamin Tabs Take 1 tablet by mouth daily.   NON FORMULARY Take 1 tablet by mouth every evening. Nervive ( for neuropathy)OTC   propranolol ER 160 MG SR capsule Commonly known as: INDERAL LA Take 1 capsule (160 mg total) by mouth daily.  rosuvastatin 20 MG tablet Commonly known as: CRESTOR Take 1 tablet (20 mg total) by mouth daily.   tadalafil 5 MG tablet Commonly known as: CIALIS Take 1 tablet by mouth daily as needed for erectile dysfunction.   Tadalafil 2.5 MG Tabs Take 1 tablet by mouth daily as needed.   triamcinolone acetonide 40 MG/ML Susp Commonly known as: TRIESENCE Inject into the articular space.   zolpidem 12.5 MG CR tablet Commonly known as: AMBIEN CR Take 6.25 mg by mouth at bedtime as needed for sleep.        Allergies:  Allergies  Allergen Reactions   Mirtazapine     Other reaction(s): too sleepy   Niacin     Other reaction(s): dysegthesias   Trazodone Hcl     Other reaction(s): too sleepy in AM     Past Medical History, Surgical history, Social history, and Family History were reviewed and updated.  Review of Systems: Review of  Systems  Constitutional: Negative.   HENT: Negative.    Eyes: Negative.   Respiratory: Negative.    Cardiovascular: Negative.   Gastrointestinal: Negative.   Genitourinary: Negative.   Musculoskeletal: Negative.   Skin:  Positive for rash.  Neurological: Negative.   Endo/Heme/Allergies: Negative.   Psychiatric/Behavioral: Negative.       Physical Exam:  weight is 200 lb (90.7 kg). His oral temperature is 98.2 F (36.8 C). His blood pressure is 109/60 and his pulse is 56 (abnormal). His respiration is 18 and oxygen saturation is 96%.   Wt Readings from Last 3 Encounters:  12/22/21 200 lb (90.7 kg)  12/04/21 200 lb 6.4 oz (90.9 kg)  11/22/21 196 lb 6.4 oz (89.1 kg)    Physical Exam Vitals reviewed.  HENT:     Head: Normocephalic and atraumatic.  Eyes:     Pupils: Pupils are equal, round, and reactive to light.  Cardiovascular:     Rate and Rhythm: Normal rate and regular rhythm.     Heart sounds: Normal heart sounds.  Pulmonary:     Effort: Pulmonary effort is normal.     Breath sounds: Normal breath sounds.  Abdominal:     General: Bowel sounds are normal.     Palpations: Abdomen is soft.     Comments: I cannot palpate his spleen.  Musculoskeletal:        General: No tenderness or deformity. Normal range of motion.     Cervical back: Normal range of motion.  Lymphadenopathy:     Cervical: No cervical adenopathy.  Skin:    General: Skin is warm and dry.     Findings: No erythema or rash.     Comments: He has the healing zoster rash in the left T7 dermatome.  There is some sensitivity to touch in this area.  Neurological:     Mental Status: He is alert and oriented to person, place, and time.  Psychiatric:        Behavior: Behavior normal.        Thought Content: Thought content normal.        Judgment: Judgment normal.      Lab Results  Component Value Date   WBC 8.5 12/22/2021   HGB 12.6 (L) 12/22/2021   HCT 37.7 (L) 12/22/2021   MCV 86.9 12/22/2021    PLT 431 (H) 12/22/2021   Lab Results  Component Value Date   FERRITIN 62 11/22/2021   IRON 185 (H) 11/22/2021   TIBC 428 11/22/2021   UIBC 243 11/22/2021  IRONPCTSAT 43 (H) 11/22/2021   Lab Results  Component Value Date   RETICCTPCT 3.6 (H) 01/18/2021   RBC 4.34 12/22/2021   RETICCTABS 87.6 02/21/2011   Lab Results  Component Value Date   KPAFRELGTCHN 10.7 09/27/2021   LAMBDASER 8.5 09/27/2021   KAPLAMBRATIO 1.26 09/27/2021   No results found for: "IGGSERUM", "IGA", "IGMSERUM" No results found for: "TOTALPROTELP", "ALBUMINELP", "A1GS", "A2GS", "BETS", "BETA2SER", "GAMS", "MSPIKE", "SPEI"   Chemistry      Component Value Date/Time   NA 142 12/22/2021 0858   NA 144 04/25/2017 1506   NA 139 04/12/2016 1113   K 3.9 12/22/2021 0858   K 4.2 04/25/2017 1506   K 4.1 04/12/2016 1113   CL 107 12/22/2021 0858   CL 105 04/25/2017 1506   CO2 27 12/22/2021 0858   CO2 29 04/25/2017 1506   CO2 22 04/12/2016 1113   BUN 18 12/22/2021 0858   BUN 17 04/25/2017 1506   BUN 16.3 04/12/2016 1113   CREATININE 1.11 12/22/2021 0858   CREATININE 1.2 04/25/2017 1506   CREATININE 0.9 04/12/2016 1113      Component Value Date/Time   CALCIUM 9.5 12/22/2021 0858   CALCIUM 9.5 04/25/2017 1506   CALCIUM 9.1 04/12/2016 1113   ALKPHOS 35 (L) 12/22/2021 0858   ALKPHOS 73 04/25/2017 1506   ALKPHOS 66 04/12/2016 1113   AST 26 12/22/2021 0858   AST 31 04/12/2016 1113   ALT 21 12/22/2021 0858   ALT 58 (H) 04/25/2017 1506   ALT 36 04/12/2016 1113   BILITOT 0.8 12/22/2021 0858   BILITOT 0.73 04/12/2016 1113       Impression and Plan: Mr. Vasil is a very pleasant 66 yo caucasian gentleman with polycythemia vera, JAK2 positive.   I feel much better now that the platelet count is back to where it needs to be.  I would like to get a ultrasound of his spleen when I see him back.  I think we need to see how his splenic enlargement has responded to the [facility].  From my point of view, I do not  see any problems with him having the cardiac ablation.  I will plan to see him back in early October.  We will get the ultrasound of the spleen the same day that I see him.    [name], MD 8/18/202310:31 AM

## 2021-12-23 LAB — LIPID PANEL
Chol/HDL Ratio: 3.1 ratio (ref 0.0–5.0)
Cholesterol, Total: 116 mg/dL (ref 100–199)
HDL: 38 mg/dL — ABNORMAL LOW (ref >39)
LDL Chol Calc (NIH): 52 mg/dL (ref 0–99)
Triglycerides: 155 mg/dL — ABNORMAL HIGH (ref 0–149)
VLDL Cholesterol Cal: 26 mg/dL (ref 5–40)

## 2021-12-25 LAB — IRON AND IRON BINDING CAPACITY (CC-WL,HP ONLY)
Iron: 158 ug/dL (ref 45–182)
Saturation Ratios: 40 % — ABNORMAL HIGH (ref 17.9–39.5)
TIBC: 399 ug/dL (ref 250–450)
UIBC: 241 ug/dL (ref 117–376)

## 2021-12-28 ENCOUNTER — Telehealth (HOSPITAL_COMMUNITY): Payer: Self-pay | Admitting: *Deleted

## 2021-12-28 ENCOUNTER — Other Ambulatory Visit (HOSPITAL_COMMUNITY): Payer: Self-pay

## 2021-12-28 NOTE — Telephone Encounter (Signed)
Reaching out to patient to offer assistance regarding upcoming cardiac imaging study; pt verbalizes understanding of appt date/time, parking situation and where to check in; name and call back number provided for further questions should they arise  Gordy Clement RN Navigator Cardiac Imaging [facility] [phone] office [phone] cell  Patient reports having MRI before without incident.

## 2021-12-29 ENCOUNTER — Other Ambulatory Visit: Payer: Self-pay | Admitting: Cardiology

## 2021-12-29 ENCOUNTER — Ambulatory Visit (HOSPITAL_COMMUNITY)
Admission: RE | Admit: 2021-12-29 | Discharge: 2021-12-29 | Disposition: A | Discharge status: Home / Self Care | Payer: Managed Care, Other (non HMO) | Source: Ambulatory Visit | Attending: Cardiology | Admitting: Cardiology

## 2021-12-29 DIAGNOSIS — R0602 Shortness of breath: Secondary | ICD-10-CM

## 2021-12-29 DIAGNOSIS — R0609 Other forms of dyspnea: Secondary | ICD-10-CM | POA: Insufficient documentation

## 2021-12-29 DIAGNOSIS — I77819 Aortic ectasia, unspecified site: Secondary | ICD-10-CM | POA: Insufficient documentation

## 2021-12-29 MED ORDER — GADOBUTROL 1 MMOL/ML IV SOLN
10.0000 mL | Freq: Once | INTRAVENOUS | Status: AC | PRN
  Administered 2021-12-29: 10 mL via INTRAVENOUS

## 2021-12-29 NOTE — Progress Notes (Signed)
Was asked to go see the patient after his cardiac MRI for c/o dizziness and numbness. On arrival to MRI department, pt was sitting in a wheelchair and a/o x 4. He states that his symptoms have improved since being out of the MRI scanner.  He states he has not had anything to eat or drink since last night  but was not diabetic.  Pt's HR was 55bpm, BP 123/70, O2 sat was 98% on room air.  Gave patient some crackers and lemon-lime soda.  Patient reports improvement of symptoms and states he feel better and feels like he can leave. Offered for patient to go the ED for evaluation and he declined. Patient encouraged to seek medical attention if anything changes and he verbalized understanding. Pt's IV removed. Patient ambulated to in department with a steady gait and no complaints of dizziness.

## 2022-01-03 NOTE — Progress Notes (Signed)
HPI: FU PAF. Stress echocardiogram June 2011 normal. Monitor February 2020 revealed sinus bradycardia, NSR, sinus tach, pacs, PVC, brief PAT and PAF. Admitted with CVA May 2020. 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 Aspirin added to apixaban by neurology as CVA occurred on apixaban. Patient was seen in follow-up by Dr. Marin Olp and it was felt that polycythemia may be contributing to his recent CVA.  Renal Dopplers May 2021 showed no renal artery stenosis.  Calcium score 5/21 18 which was 71st percentile.  Abdominal ultrasound April 2023 showed no aneurysm.  Echocardiogram May 2023 showed normal LV function, grade 1 diastolic dysfunction, mild right ventricular enlargement and mildly dilated ascending aorta at 39 mm.  Cardiac MRI August 2023 with poor patient tolerance and study was truncated; ejection fraction 54% with normal wall thickness; study felt to be low likelihood for cardiac amyloid; mildly dilated ascending aorta at 40 mm.  Readmitted with atrial fibrillation June 2023.  Patient converted to sinus rhythm spontaneously.  Seen by Dr. Curt Bears and discussed atrial fibrillation but patient wanted conservative measures for now.  Since last seen he has occasional flutters but no sustained palpitations and no recurrences of atrial fibrillation by his report.  He denies dyspnea on exertion, orthopnea, PND, pedal edema, chest pain or syncope.  Current Outpatient Medications  Medication Sig Dispense Refill   acetaminophen (TYLENOL) 500 MG tablet Take 1,000 mg by mouth every 6 (six) hours as needed for mild pain.      apixaban (ELIQUIS) 5 MG TABS tablet Take 1 tablet (5 mg total) by mouth 2 (two) times daily. 180 tablet 3   aspirin EC 81 MG EC tablet Take 1 tablet (81 mg total) by mouth daily. 60 tablet 0   diltiazem (CARDIZEM  CD) 120 MG 24 hr capsule Take 1 capsule (120 mg total) by mouth daily. 90 capsule 3   fluorouracil (EFUDEX) 5 % cream Apply 1 application  topically 2 (two) times daily as needed (dry skin).     gabapentin (NEURONTIN) 300 MG capsule Take 1 capsule (300 mg total) by mouth 4 (four) times daily. (Patient taking differently: Take 600 mg by mouth 2 (two) times daily.) 360 capsule 3   melatonin 5 MG TABS Take 5 mg by mouth at bedtime.     Multiple Vitamins-Minerals (MENS MULTIVITAMIN) TABS Take 1 tablet by mouth daily.     propranolol ER (INDERAL LA) 160 MG SR capsule Take 1 capsule (160 mg total) by mouth daily. 90 capsule 3   rosuvastatin (CRESTOR) 20 MG tablet Take 1 tablet (20 mg total) by mouth daily. 90 tablet 3   ruxolitinib phosphate (JAKAFI) 10 MG tablet TAKE 1 TABLET BY MOUTH 2 TIMES DAILY. 60 tablet 4   tadalafil (CIALIS) 5 MG tablet Take 1 tablet by mouth daily as needed for erectile dysfunction.     tamsulosin (FLOMAX) 0.4 MG CAPS capsule Take 1 capsule (0.4 mg total) by mouth daily. 30 capsule 0   traMADol (ULTRAM) 50 MG tablet Take 1 tablet (50 mg total) by mouth every 6 (six) hours as needed for severe pain. 9 tablet 0   zolpidem (AMBIEN CR) 12.5 MG CR tablet Take 6.25 mg by mouth at bedtime as needed for sleep.     No current facility-administered medications for this visit.     Past Medical History:  Diagnosis Date   Diverticulosis of  colon (without mention of hemorrhage)    Hemorrhoids    Hypertension    IBS (irritable bowel syndrome)    Iron deficiency anemia due to chronic blood loss 09/26/2017   Irregular heartbeat    Nephrolithiasis    Other and unspecified hyperlipidemia    Perirectal fistula    Polycythemia, secondary    Stroke (HCC)    Stye    blocker duct right eye lid    Past Surgical History:  Procedure Laterality Date   RETINAL DETACHMENT SURGERY     ROTATOR CUFF REPAIR     rt.   VASECTOMY      Social History   Socioeconomic History   Marital  status: Married    Spouse name: Mardene Celeste   Number of children: 2   Years of education: Not on file   Highest education level: Not on file  Occupational History   Occupation: Lobbyist: [employer]  Tobacco Use   Smoking status: Never   Smokeless tobacco: Never  Vaping Use   Vaping Use: Never used  Substance and Sexual Activity   Alcohol use: Yes    Alcohol/week: 0.0 standard drinks of alcohol    Comment: Occasional   Drug use: No   Sexual activity: Yes  Other Topics Concern   Not on file  Social History Narrative   Lives with wife   Social Determinants of Health   Financial Resource Strain: Not on file  Food Insecurity: Not on file  Transportation Needs: Not on file  Physical Activity: Not on file  Stress: Not on file  Social Connections: Not on file  Intimate Partner Violence: Not on file    Family History  Problem Relation Age of Onset   Aortic aneurysm Father    Heart attack Brother    Cerebral palsy Brother    Colon cancer Neg Hx    Esophageal cancer Neg Hx    Pancreatic cancer Neg Hx    Prostate cancer Neg Hx    Rectal cancer Neg Hx    Stomach cancer Neg Hx     ROS: no fevers or chills, productive cough, hemoptysis, dysphasia, odynophagia, melena, hematochezia, dysuria, hematuria, rash, seizure activity, orthopnea, PND, pedal edema, claudication. Remaining systems are negative.  Physical Exam: Well-developed well-nourished in no acute distress.  Skin is warm and dry.  HEENT is normal.  Neck is supple.  Chest is clear to auscultation with normal expansion.  Cardiovascular exam is regular rate and rhythm.  Abdominal exam nontender or distended. No masses palpated. Extremities show no edema. neuro grossly intact  A/P  1 paroxysmal atrial fibrillation-patient remains in sinus rhythm status post recent hospitalization in June.  Continue cardizem, Inderal and apixaban.  He is scheduled for atrial fibrillation ablation in December but  would like to discuss this again with Dr. Curt Bears before proceeding.  We will arrange.  2 hypertension-patient's blood pressure is controlled.  Continue present medical regimen.  3 hyperlipidemia-continue statin.  4 question fibromuscular dysplasia on prior CTA-follow-up renal Doppler showed no renal artery stenosis and findings were not suggestive of fibromuscular dysplasia.  5 coronary calcification-mildly elevated calcium score previously.  Continue statin.  6 polycythemia vera-Per oncology.  Kirk Ruths, MD

## 2022-01-15 ENCOUNTER — Encounter (HOSPITAL_BASED_OUTPATIENT_CLINIC_OR_DEPARTMENT_OTHER): Payer: Self-pay | Admitting: Emergency Medicine

## 2022-01-15 ENCOUNTER — Other Ambulatory Visit (HOSPITAL_COMMUNITY): Payer: Self-pay

## 2022-01-15 ENCOUNTER — Other Ambulatory Visit: Payer: Self-pay | Admitting: Hematology & Oncology

## 2022-01-15 ENCOUNTER — Emergency Department (HOSPITAL_BASED_OUTPATIENT_CLINIC_OR_DEPARTMENT_OTHER)
Admission: EM | Admit: 2022-01-15 | Discharge: 2022-01-15 | Disposition: A | Discharge status: Home / Self Care | Payer: Managed Care, Other (non HMO) | Attending: Emergency Medicine | Admitting: Emergency Medicine

## 2022-01-15 ENCOUNTER — Emergency Department (HOSPITAL_BASED_OUTPATIENT_CLINIC_OR_DEPARTMENT_OTHER): Payer: Managed Care, Other (non HMO)

## 2022-01-15 ENCOUNTER — Other Ambulatory Visit: Payer: Self-pay

## 2022-01-15 ENCOUNTER — Other Ambulatory Visit (HOSPITAL_BASED_OUTPATIENT_CLINIC_OR_DEPARTMENT_OTHER): Payer: Self-pay

## 2022-01-15 DIAGNOSIS — Z7982 Long term (current) use of aspirin: Secondary | ICD-10-CM | POA: Insufficient documentation

## 2022-01-15 DIAGNOSIS — N200 Calculus of kidney: Secondary | ICD-10-CM

## 2022-01-15 DIAGNOSIS — R1030 Lower abdominal pain, unspecified: Secondary | ICD-10-CM | POA: Diagnosis not present

## 2022-01-15 DIAGNOSIS — M545 Low back pain, unspecified: Secondary | ICD-10-CM | POA: Insufficient documentation

## 2022-01-15 DIAGNOSIS — D649 Anemia, unspecified: Secondary | ICD-10-CM | POA: Diagnosis not present

## 2022-01-15 DIAGNOSIS — Z7901 Long term (current) use of anticoagulants: Secondary | ICD-10-CM | POA: Diagnosis not present

## 2022-01-15 DIAGNOSIS — D751 Secondary polycythemia: Secondary | ICD-10-CM

## 2022-01-15 LAB — CBC WITH DIFFERENTIAL/PLATELET
Abs Immature Granulocytes: 0.12 10*3/uL — ABNORMAL HIGH (ref 0.00–0.07)
Basophils Absolute: 0.1 10*3/uL (ref 0.0–0.1)
Basophils Relative: 1 %
Eosinophils Absolute: 0.5 10*3/uL (ref 0.0–0.5)
Eosinophils Relative: 5 %
HCT: 37.6 % — ABNORMAL LOW (ref 39.0–52.0)
Hemoglobin: 12.6 g/dL — ABNORMAL LOW (ref 13.0–17.0)
Immature Granulocytes: 1 %
Lymphocytes Relative: 12 %
Lymphs Abs: 1.3 10*3/uL (ref 0.7–4.0)
MCH: 28.6 pg (ref 26.0–34.0)
MCHC: 33.5 g/dL (ref 30.0–36.0)
MCV: 85.3 fL (ref 80.0–100.0)
Monocytes Absolute: 0.9 10*3/uL (ref 0.1–1.0)
Monocytes Relative: 9 %
Neutro Abs: 7.2 10*3/uL (ref 1.7–7.7)
Neutrophils Relative %: 72 %
Platelets: 465 10*3/uL — ABNORMAL HIGH (ref 150–400)
RBC: 4.41 MIL/uL (ref 4.22–5.81)
RDW: 17.2 % — ABNORMAL HIGH (ref 11.5–15.5)
WBC: 10.1 10*3/uL (ref 4.0–10.5)
nRBC: 0.5 % — ABNORMAL HIGH (ref 0.0–0.2)

## 2022-01-15 LAB — URINALYSIS, ROUTINE W REFLEX MICROSCOPIC
Bilirubin Urine: NEGATIVE
Glucose, UA: NEGATIVE mg/dL
Ketones, ur: NEGATIVE mg/dL
Nitrite: NEGATIVE
Protein, ur: NEGATIVE mg/dL
Specific Gravity, Urine: 1.02 (ref 1.005–1.030)
pH: 6.5 (ref 5.0–8.0)

## 2022-01-15 LAB — URINALYSIS, MICROSCOPIC (REFLEX)

## 2022-01-15 LAB — BASIC METABOLIC PANEL
Anion gap: 6 (ref 5–15)
BUN: 15 mg/dL (ref 8–23)
CO2: 25 mmol/L (ref 22–32)
Calcium: 8.7 mg/dL — ABNORMAL LOW (ref 8.9–10.3)
Chloride: 108 mmol/L (ref 98–111)
Creatinine, Ser: 1.11 mg/dL (ref 0.61–1.24)
GFR, Estimated: >60 mL/min (ref >60)
Glucose, Bld: 120 mg/dL — ABNORMAL HIGH (ref 70–99)
Potassium: 3.8 mmol/L (ref 3.5–5.1)
Sodium: 139 mmol/L (ref 135–145)

## 2022-01-15 MED ORDER — TRAMADOL HCL 50 MG PO TABS
50.0000 mg | ORAL_TABLET | Freq: Four times a day (QID) | ORAL | 0 refills | Status: DC | PRN
  Filled 2022-01-15: qty 9, 3d supply, fill #0

## 2022-01-15 MED ORDER — TAMSULOSIN HCL 0.4 MG PO CAPS
0.4000 mg | ORAL_CAPSULE | Freq: Every day | ORAL | 0 refills | Status: DC
  Filled 2022-01-15: qty 30, 30d supply, fill #0

## 2022-01-15 MED ORDER — DICLOFENAC SODIUM ER 100 MG PO TB24
100.0000 mg | ORAL_TABLET | Freq: Every day | ORAL | 0 refills | Status: DC
Start: 2022-01-15 — End: 2022-01-15
  Filled 2022-01-15: qty 10, 10d supply, fill #0

## 2022-01-15 MED ORDER — ONDANSETRON 8 MG PO TBDP
8.0000 mg | ORAL_TABLET | Freq: Three times a day (TID) | ORAL | 0 refills | Status: DC | PRN
  Filled 2022-01-15: qty 10, 4d supply, fill #0

## 2022-01-15 MED ORDER — KETOROLAC TROMETHAMINE 30 MG/ML IJ SOLN
30.0000 mg | Freq: Once | INTRAMUSCULAR | Status: AC
  Administered 2022-01-15: 30 mg via INTRAVENOUS
  Filled 2022-01-15: qty 1

## 2022-01-15 MED ORDER — RUXOLITINIB PHOSPHATE 10 MG PO TABS
ORAL_TABLET | Freq: Two times a day (BID) | ORAL | 4 refills | Status: DC
  Filled 2022-01-15: qty 60, 30d supply, fill #0
  Filled 2022-02-19: qty 60, 30d supply, fill #1
  Filled 2022-03-27: qty 60, 30d supply, fill #2
  Filled 2022-04-17 – 2022-04-20 (×2): qty 60, 30d supply, fill #3
  Filled 2022-05-17 – 2022-06-04 (×2): qty 60, 30d supply, fill #4

## 2022-01-15 MED ORDER — TAMSULOSIN HCL 0.4 MG PO CAPS
0.4000 mg | ORAL_CAPSULE | ORAL | Status: AC
  Administered 2022-01-15: 0.4 mg via ORAL
  Filled 2022-01-15: qty 1

## 2022-01-15 MED ORDER — MAGNESIUM SULFATE 2 GM/50ML IV SOLN
2.0000 g | Freq: Once | INTRAVENOUS | Status: AC
Start: 2022-01-15 — End: 2022-01-15
  Administered 2022-01-15: 2 g via INTRAVENOUS
  Filled 2022-01-15: qty 50

## 2022-01-15 MED ORDER — TRAMADOL HCL 50 MG PO TABS
50.0000 mg | ORAL_TABLET | ORAL | Status: AC
  Administered 2022-01-15: 50 mg via ORAL
  Filled 2022-01-15: qty 1

## 2022-01-15 MED ORDER — ONDANSETRON 8 MG PO TBDP
ORAL_TABLET | ORAL | 0 refills | Status: DC
Start: 2022-01-15 — End: 2022-01-15
  Filled 2022-01-15: qty 10, fill #0

## 2022-01-15 MED ORDER — ONDANSETRON HCL 4 MG/2ML IJ SOLN
4.0000 mg | Freq: Once | INTRAMUSCULAR | Status: AC
  Administered 2022-01-15: 4 mg via INTRAVENOUS
  Filled 2022-01-15: qty 2

## 2022-01-15 NOTE — ED Provider Notes (Addendum)
[facility] Provider Note   CSN: [id number] Arrival date & time: 01/15/22  0455     History  Chief Complaint  Patient presents with   Back Pain   Groin Pain    [name] is a 66 y.o. male.  The history is provided by the patient.  Back Pain Pain location: lower left back. Quality:  Shooting (pressure) Radiates to: Left groin. Pain severity:  Severe Pain is:  Same all the time Onset quality:  Sudden Duration: hours. Timing:  Constant Progression:  Unchanged Chronicity:  New Relieved by:  Nothing Worsened by:  Nothing Ineffective treatments:  None tried Patient with polycythemia and diverticulitis, IBS and kidney stones and prostate issues presents with left low back pain that radiates to the left groin.      Home Medications Prior to Admission medications   Medication Sig Start Date End Date Taking? Authorizing Provider  acetaminophen (TYLENOL) 500 MG tablet Take 1,000 mg by mouth every 6 (six) hours as needed for mild pain.     [provider]  apixaban (ELIQUIS) 5 MG TABS tablet Take 1 tablet (5 mg total) by mouth 2 (two) times daily. 10/11/21 10/12/22  Lelon Perla, MD  aspirin EC 81 MG EC tablet Take 1 tablet (81 mg total) by mouth daily. 09/16/18   Lavina Hamman, MD  diltiazem (CARDIZEM CD) 120 MG 24 hr capsule Take 1 capsule (120 mg total) by mouth daily. 11/08/21   Lelon Perla, MD  fluorouracil (EFUDEX) 5 % cream Apply 1 application  topically 2 (two) times daily as needed (dry skin). 10/28/20   [provider]  gabapentin (NEURONTIN) 300 MG capsule Take 1 capsule (300 mg total) by mouth 4 (four) times daily. Patient taking differently: Take 600 mg by mouth 2 (two) times daily. 09/11/21   [name], NP  melatonin 5 MG TABS Take 5 mg by mouth at bedtime.    [provider]  Multiple Vitamins-Minerals (MENS MULTIVITAMIN) TABS Take 1 tablet by mouth daily.    [provider]  NON  FORMULARY Take 1 tablet by mouth every evening. Nervive ( for neuropathy)OTC Patient not taking: Reported on 12/04/2021    [provider]  propranolol ER (INDERAL LA) 160 MG SR capsule Take 1 capsule (160 mg total) by mouth daily. 10/11/21   Lelon Perla, MD  rosuvastatin (CRESTOR) 20 MG tablet Take 1 tablet (20 mg total) by mouth daily. 10/11/21   Lelon Perla, MD  ruxolitinib phosphate (JAKAFI) 10 MG tablet TAKE 1 TABLET BY MOUTH 2 TIMES DAILY. 08/17/21 08/17/22  [name], MD  tadalafil (CIALIS) 5 MG tablet Take 1 tablet by mouth daily as needed for erectile dysfunction. 05/16/21   [provider]  Tadalafil 2.5 MG TABS Take 1 tablet by mouth daily as needed. 12/11/21   [provider]  triamcinolone acetonide (TRIESENCE) 40 MG/ML SUSP Inject into the articular space. Patient not taking: Reported on 12/04/2021 09/25/21   [provider]  zolpidem (AMBIEN CR) 12.5 MG CR tablet Take 6.25 mg by mouth at bedtime as needed for sleep.    [provider]      Allergies    Mirtazapine, Niacin, and Trazodone hcl    Review of Systems   Review of Systems  Musculoskeletal:  Positive for back pain.    Physical Exam Updated Vital Signs BP (!) 151/80   Pulse (!) 50   Temp (!) 97.4 F (36.3 C)  Resp 18   Ht '5\' 11"'$  (1.803 m)   Wt 88.5 kg   SpO2 97%   BMI 27.20 kg/m  Physical Exam Vitals and nursing note reviewed. Exam conducted with a chaperone present.  Constitutional:      General: He is not in acute distress.    Appearance: He is well-developed. He is not diaphoretic.  HENT:     Head: Normocephalic and atraumatic.     Nose: Nose normal.  Eyes:     Conjunctiva/sclera: Conjunctivae normal.     Pupils: Pupils are equal, round, and reactive to light.  Cardiovascular:     Rate and Rhythm: Normal rate and regular rhythm.  Pulmonary:     Effort: Pulmonary effort is normal.     Breath sounds: Normal breath sounds. No wheezing or rales.   Abdominal:     General: Bowel sounds are normal.     Palpations: Abdomen is soft.     Tenderness: There is no abdominal tenderness. There is no guarding or rebound.  Musculoskeletal:        General: Normal range of motion.     Cervical back: Normal range of motion and neck supple.  Skin:    General: Skin is warm and dry.     Capillary Refill: Capillary refill takes less than 2 seconds.  Neurological:     General: No focal deficit present.     Mental Status: He is alert and oriented to person, place, and time.  Psychiatric:        Mood and Affect: Mood normal.        Behavior: Behavior normal.     ED Results / Procedures / Treatments   Labs (all labs ordered are listed, but only abnormal results are displayed) Results for orders placed or performed during the hospital encounter of 01/15/22  CBC with Differential  Result Value Ref Range   WBC 10.1 4.0 - 10.5 K/uL   RBC 4.41 4.22 - 5.81 MIL/uL   Hemoglobin 12.6 (L) 13.0 - 17.0 g/dL   HCT 37.6 (L) 39.0 - 52.0 %   MCV 85.3 80.0 - 100.0 fL   MCH 28.6 26.0 - 34.0 pg   MCHC 33.5 30.0 - 36.0 g/dL   RDW 17.2 (H) 11.5 - 15.5 %   Platelets 465 (H) 150 - 400 K/uL   nRBC 0.5 (H) 0.0 - 0.2 %   Neutrophils Relative % 72 %   Neutro Abs 7.2 1.7 - 7.7 K/uL   Lymphocytes Relative 12 %   Lymphs Abs 1.3 0.7 - 4.0 K/uL   Monocytes Relative 9 %   Monocytes Absolute 0.9 0.1 - 1.0 K/uL   Eosinophils Relative 5 %   Eosinophils Absolute 0.5 0.0 - 0.5 K/uL   Basophils Relative 1 %   Basophils Absolute 0.1 0.0 - 0.1 K/uL   Immature Granulocytes 1 %   Abs Immature Granulocytes 0.12 (H) 0.00 - 0.07 K/uL  Basic metabolic panel  Result Value Ref Range   Sodium 139 135 - 145 mmol/L   Potassium 3.8 3.5 - 5.1 mmol/L   Chloride 108 98 - 111 mmol/L   CO2 25 22 - 32 mmol/L   Glucose, Bld 120 (H) 70 - 99 mg/dL   BUN 15 8 - 23 mg/dL   Creatinine, Ser 1.11 0.61 - 1.24 mg/dL   Calcium 8.7 (L) 8.9 - 10.3 mg/dL   GFR, Estimated >60 >60 mL/min   Anion  gap 6 5 - 15   MR CARDIAC MORPHOLOGY W WO  CONTRAST  Result Date: 12/29/2021 CLINICAL DATA:  DOE, query amyloid EXAM: CARDIAC MRI TECHNIQUE: The patient was scanned on a 1.5 Tesla GE magnet. A dedicated cardiac coil was used. Functional imaging was done using Fiesta sequences. 2,3, and 4 chamber views were done to assess for RWMA's. Modified Simpson's rule using a short axis stack was used to calculate an ejection fraction on a dedicated work Conservation officer, nature. The patient received 66m GADAVIST GADOBUTROL 1 MMOL/ML IV SOLN. After 10 minutes inversion recovery sequences were used to assess for infiltration and scar tissue. Phase contrast velocity encoded images obtained. This examination is tailored for evaluation cardiac anatomy and function and provides very limited assessment of noncardiac structures, which are accordingly not evaluated during interpretation. If there is clinical concern for extracardiac pathology, further evaluation with CT imaging should be considered. FINDINGS: LEFT VENTRICLE: Mildly dilated left ventricular chamber size by indexed volume. Normal left ventricular wall thickness. Normal left ventricular systolic function. LVEF = 54% There are no regional wall motion abnormalities. No myocardial edema, T2 = 50 msec Normal first pass perfusion. There is no post contrast delayed myocardial enhancement on limited images obtained. Normal T1 myocardial nulling kinetics suggest against a diagnosis of cardiac amyloidosis. ECV = 27% Though complete set of post contrast images were not obtained, findings in combination suggest low likelihood of cardiac amyloidosis. RIGHT VENTRICLE: Mildly dilated right ventricular chamber size by indexed volume. Normal right ventricular wall thickness. Normal right ventricular systolic function. RVEF = 49% There are no regional wall motion abnormalities. No post contrast delayed myocardial enhancement. ATRIA: Normal left atrial size. Normal right atrial  size. VALVES: No significant valvular abnormalities. Tricuspid aortic valve. AORTA: 40 mm mid ascending aorta, mildly dilated. PERICARDIUM: Normal pericardium.  No pericardial effusion. OTHER: No significant extracardiac findings. MEASUREMENTS: Qp/Qs: 1.05 Left ventricle: LV male LV EF: 54% (Normal 56-78%) Absolute volumes: LV EDV: 235 mL (Normal 77-195 mL) LV ESV: 108 mL (Normal 19-72 mL) LV SV: 127 mL (Normal 51-133 mL) CO: 6.2 L/min (Normal 2.8-8.8 L/min) Indexed volumes: LV EDV: 109 mL/sq-m (Normal 47-92 mL/sq-m) LV ESV: 50 mL/sq-m (Normal 13-30 mL/sq-m) LV SV: 59 mL/sq-m (Normal 32-62 mL/sq-m) CI: 2.88 L/min/sq-m (Normal 1.7-4.2 L/min/sq-m) Right ventricle: RV male RV EF: 49 % (Normal 47-74%) Absolute volumes: RV EDV: 246 mL (Normal 88-227 mL) RV ESV: 127 mL (Normal 23-103 mL) RV SV: 120 mL (Normal 52-138 mL) CO: 5.9 L/min (Normal 2.8-8.8 L/min) Indexed volumes: RV EDV: 114 mL/sq-m (Normal 55-105 mL/sq-m) RV ESV: 59 mL/sq-m (Normal 15-43 mL/sq-m) RV SV: 56 mL/sq-m (Normal 32-64 mL/sq-m) CI: 2.73 L/min/sq-m (Normal 1.7-4.2 L/min/sq-m) IMPRESSION: Patient was unable to tolerate duration of scan, study was truncated per patient preference. Limited post-contrast delayed myocardial enhancement images available for review. 1. Mild biventricular dilation by indexed volume with preserved biventricular function. LVEF 54%, RVEF 49%. 2. Normal wall thickness. Normal range extracellular volume. Normal T1 myocardial nulling kinetics. Limited post-contrast images demonstrate no delayed myocardial enhancement. Finding in combination suggest low likelihood of cardiac amyloidosis. 3. Mild dilation of ascending aorta, 40 mm. May represent upper limit of normal for BSA and age. Electronically Signed   By: GCherlynn KaiserM.D.   On: 12/29/2021 15:58   MR CARDIAC VELOCITY FLOW MAP  Result Date: 12/29/2021 CLINICAL DATA:  DOE, query amyloid EXAM: CARDIAC MRI TECHNIQUE: The patient was scanned on a 1.5 Tesla GE magnet. A  dedicated cardiac coil was used. Functional imaging was done using Fiesta sequences. 2,3, and 4 chamber views were done to  assess for RWMA's. Modified Simpson's rule using a short axis stack was used to calculate an ejection fraction on a dedicated work Conservation officer, nature. The patient received 51m GADAVIST GADOBUTROL 1 MMOL/ML IV SOLN. After 10 minutes inversion recovery sequences were used to assess for infiltration and scar tissue. Phase contrast velocity encoded images obtained. This examination is tailored for evaluation cardiac anatomy and function and provides very limited assessment of noncardiac structures, which are accordingly not evaluated during interpretation. If there is clinical concern for extracardiac pathology, further evaluation with CT imaging should be considered. FINDINGS: LEFT VENTRICLE: Mildly dilated left ventricular chamber size by indexed volume. Normal left ventricular wall thickness. Normal left ventricular systolic function. LVEF = 54% There are no regional wall motion abnormalities. No myocardial edema, T2 = 50 msec Normal first pass perfusion. There is no post contrast delayed myocardial enhancement on limited images obtained. Normal T1 myocardial nulling kinetics suggest against a diagnosis of cardiac amyloidosis. ECV = 27% Though complete set of post contrast images were not obtained, findings in combination suggest low likelihood of cardiac amyloidosis. RIGHT VENTRICLE: Mildly dilated right ventricular chamber size by indexed volume. Normal right ventricular wall thickness. Normal right ventricular systolic function. RVEF = 49% There are no regional wall motion abnormalities. No post contrast delayed myocardial enhancement. ATRIA: Normal left atrial size. Normal right atrial size. VALVES: No significant valvular abnormalities. Tricuspid aortic valve. AORTA: 40 mm mid ascending aorta, mildly dilated. PERICARDIUM: Normal pericardium.  No pericardial effusion. OTHER: No  significant extracardiac findings. MEASUREMENTS: Qp/Qs: 1.05 Left ventricle: LV male LV EF: 54% (Normal 56-78%) Absolute volumes: LV EDV: 235 mL (Normal 77-195 mL) LV ESV: 108 mL (Normal 19-72 mL) LV SV: 127 mL (Normal 51-133 mL) CO: 6.2 L/min (Normal 2.8-8.8 L/min) Indexed volumes: LV EDV: 109 mL/sq-m (Normal 47-92 mL/sq-m) LV ESV: 50 mL/sq-m (Normal 13-30 mL/sq-m) LV SV: 59 mL/sq-m (Normal 32-62 mL/sq-m) CI: 2.88 L/min/sq-m (Normal 1.7-4.2 L/min/sq-m) Right ventricle: RV male RV EF: 49 % (Normal 47-74%) Absolute volumes: RV EDV: 246 mL (Normal 88-227 mL) RV ESV: 127 mL (Normal 23-103 mL) RV SV: 120 mL (Normal 52-138 mL) CO: 5.9 L/min (Normal 2.8-8.8 L/min) Indexed volumes: RV EDV: 114 mL/sq-m (Normal 55-105 mL/sq-m) RV ESV: 59 mL/sq-m (Normal 15-43 mL/sq-m) RV SV: 56 mL/sq-m (Normal 32-64 mL/sq-m) CI: 2.73 L/min/sq-m (Normal 1.7-4.2 L/min/sq-m) IMPRESSION: Patient was unable to tolerate duration of scan, study was truncated per patient preference. Limited post-contrast delayed myocardial enhancement images available for review. 1. Mild biventricular dilation by indexed volume with preserved biventricular function. LVEF 54%, RVEF 49%. 2. Normal wall thickness. Normal range extracellular volume. Normal T1 myocardial nulling kinetics. Limited post-contrast images demonstrate no delayed myocardial enhancement. Finding in combination suggest low likelihood of cardiac amyloidosis. 3. Mild dilation of ascending aorta, 40 mm. May represent upper limit of normal for BSA and age. Electronically Signed   By: GCherlynn KaiserM.D.   On: 12/29/2021 15:58     Procedures Procedures    Medications Ordered in ED Medications  ketorolac (TORADOL) 30 MG/ML injection 30 mg (has no administration in time range)  ondansetron (ZOFRAN) injection 4 mg (has no administration in time range)  magnesium sulfate IVPB 2 g 50 mL (has no administration in time range)    ED Course/ Medical Decision Making/ A&P                            Medical Decision Making Left low back pain and groin  pain that started during the night   Amount and/or Complexity of Data Reviewed External Data Reviewed: notes.    Details: Previous notes reviewed  Labs: ordered.    Details: All labs reviewed:  normal white count 10.1, hemoglobin slightly low 12.6, elevated platelet count 465K.  Normal sodium 139 and potassium 3.8, normal creatinine 1.11  Radiology: ordered.  Risk Prescription drug management. Risk Details: Treated for kidney stone in the ED.  Well appearing.  Stable for discharge.  Contact your urologist in the am for ongoing care.  Strain all urine.  He has been drinking water but is unable to urinate and states this is not unusual for him as he has trouble with his prostate.  He is advised to call urology today for follow up.      Final Clinical Impression(s) / ED Diagnoses Final diagnoses:  None   Signed out to Dr. Regenia Skeeter pending urine    Parul Porcelli, MD 01/15/22 9012

## 2022-01-15 NOTE — Discharge Instructions (Addendum)
If you develop fever, vomiting, intractable pain, or any other new/concerning symptoms then call 911 or return to the ER.  Given this is a urologic problem we would prefer [name] but know that in an emergency sometimes you have to go wherever you are closest to so we will gladly see you here if you cannot get to [facility].

## 2022-01-15 NOTE — ED Provider Notes (Signed)
Urinalysis shows no UTI on my interpretation.  Patient is requesting that the meds prescribed earlier are sent to this pharmacy rather than [facility].  I have reordered the meds from Dr. Randal Buba and send them here.   Sherwood Gambler, MD 01/15/22 [phone]

## 2022-01-15 NOTE — ED Notes (Signed)
Pt to CT in wheelchair with CT tech

## 2022-01-15 NOTE — ED Triage Notes (Signed)
Pt reports low back pain that radiates from lower back to LT groin. Pt reports a pressure and states that he was having difficulty urinating. Reports urine was clear. Endorses mild constipation; no fevers, N/V/D.

## 2022-01-17 ENCOUNTER — Ambulatory Visit: Payer: Managed Care, Other (non HMO) | Attending: Cardiology | Admitting: Cardiology

## 2022-01-17 ENCOUNTER — Encounter: Payer: Self-pay | Admitting: Cardiology

## 2022-01-17 VITALS — BP 110/68 | HR 68 | Ht 71.0 in | Wt 201.1 lb

## 2022-01-17 DIAGNOSIS — I480 Paroxysmal atrial fibrillation: Secondary | ICD-10-CM | POA: Diagnosis not present

## 2022-01-17 DIAGNOSIS — I10 Essential (primary) hypertension: Secondary | ICD-10-CM | POA: Diagnosis not present

## 2022-01-17 DIAGNOSIS — E7800 Pure hypercholesterolemia, unspecified: Secondary | ICD-10-CM

## 2022-01-17 NOTE — Patient Instructions (Signed)
  Follow-Up: At [facility], you and your health needs are our priority.  As part of our continuing mission to provide you with exceptional heart care, we have created designated Provider Care Teams.  These Care Teams include your primary Cardiologist (physician) and Advanced Practice Providers (APPs -  Physician Assistants and Nurse Practitioners) who all work together to provide you with the care you need, when you need it.  We recommend signing up for the patient portal called "MyChart".  Sign up information is provided on this After Visit Summary.  MyChart is used to connect with patients for Virtual Visits (Telemedicine).  Patients are able to view lab/test results, encounter notes, upcoming appointments, etc.  Non-urgent messages can be sent to your provider as well.   To learn more about what you can do with MyChart, go to [email or web address].    Your next appointment:   6 month(s)  The format for your next appointment:   In Person  Provider:  Brian Crenshaw MD     

## 2022-01-29 ENCOUNTER — Other Ambulatory Visit (HOSPITAL_COMMUNITY): Payer: Self-pay

## 2022-01-30 ENCOUNTER — Encounter: Payer: Self-pay | Admitting: Cardiology

## 2022-01-30 ENCOUNTER — Other Ambulatory Visit (HOSPITAL_COMMUNITY): Payer: Self-pay

## 2022-01-30 ENCOUNTER — Other Ambulatory Visit: Payer: Self-pay | Admitting: Cardiology

## 2022-01-30 DIAGNOSIS — I10 Essential (primary) hypertension: Secondary | ICD-10-CM

## 2022-01-30 MED ORDER — DILTIAZEM HCL ER COATED BEADS 120 MG PO CP24
120.0000 mg | ORAL_CAPSULE | Freq: Every day | ORAL | 3 refills | Status: DC
  Filled 2022-01-30: qty 90, 90d supply, fill #0
  Filled 2022-04-24: qty 90, 90d supply, fill #1
  Filled 2022-07-11: qty 90, 90d supply, fill #2
  Filled 2022-09-24: qty 90, 90d supply, fill #3

## 2022-01-30 MED ORDER — APIXABAN 5 MG PO TABS
5.0000 mg | ORAL_TABLET | Freq: Two times a day (BID) | ORAL | 3 refills | Status: DC
  Filled 2022-01-30: qty 180, 90d supply, fill #0
  Filled 2022-04-24: qty 180, 90d supply, fill #1
  Filled 2022-07-11: qty 180, 90d supply, fill #2
  Filled 2022-09-24: qty 180, 90d supply, fill #3

## 2022-01-30 MED ORDER — LOSARTAN POTASSIUM 100 MG PO TABS
100.0000 mg | ORAL_TABLET | Freq: Every day | ORAL | 3 refills | Status: DC
Start: 2022-01-30 — End: 2023-01-16
  Filled 2022-01-30: qty 90, 90d supply, fill #0
  Filled 2022-04-24: qty 90, 90d supply, fill #1
  Filled 2022-07-11: qty 90, 90d supply, fill #2
  Filled 2022-09-24: qty 90, 90d supply, fill #3

## 2022-02-01 ENCOUNTER — Other Ambulatory Visit (HOSPITAL_COMMUNITY): Payer: Self-pay

## 2022-02-16 ENCOUNTER — Other Ambulatory Visit (HOSPITAL_COMMUNITY): Payer: Self-pay

## 2022-02-19 ENCOUNTER — Other Ambulatory Visit (HOSPITAL_COMMUNITY): Payer: Self-pay

## 2022-02-20 ENCOUNTER — Inpatient Hospital Stay: Payer: Managed Care, Other (non HMO) | Attending: Hematology & Oncology | Admitting: Hematology & Oncology

## 2022-02-20 ENCOUNTER — Inpatient Hospital Stay: Payer: Managed Care, Other (non HMO)

## 2022-02-20 ENCOUNTER — Encounter: Payer: Self-pay | Admitting: Hematology & Oncology

## 2022-02-20 ENCOUNTER — Ambulatory Visit (HOSPITAL_BASED_OUTPATIENT_CLINIC_OR_DEPARTMENT_OTHER)
Admission: RE | Admit: 2022-02-20 | Discharge: 2022-02-20 | Disposition: A | Discharge status: Home / Self Care | Payer: Managed Care, Other (non HMO) | Source: Ambulatory Visit | Attending: Hematology & Oncology | Admitting: Hematology & Oncology

## 2022-02-20 ENCOUNTER — Other Ambulatory Visit: Payer: Self-pay

## 2022-02-20 VITALS — BP 109/67 | HR 50 | Temp 97.8°F | Resp 18 | Ht 71.0 in | Wt 201.0 lb

## 2022-02-20 DIAGNOSIS — I480 Paroxysmal atrial fibrillation: Secondary | ICD-10-CM | POA: Diagnosis not present

## 2022-02-20 DIAGNOSIS — D45 Polycythemia vera: Secondary | ICD-10-CM | POA: Insufficient documentation

## 2022-02-20 DIAGNOSIS — D751 Secondary polycythemia: Secondary | ICD-10-CM

## 2022-02-20 DIAGNOSIS — Z7901 Long term (current) use of anticoagulants: Secondary | ICD-10-CM | POA: Diagnosis not present

## 2022-02-20 DIAGNOSIS — E611 Iron deficiency: Secondary | ICD-10-CM | POA: Insufficient documentation

## 2022-02-20 LAB — CBC WITH DIFFERENTIAL (CANCER CENTER ONLY)
Abs Immature Granulocytes: 0.17 10*3/uL — ABNORMAL HIGH (ref 0.00–0.07)
Basophils Absolute: 0.2 10*3/uL — ABNORMAL HIGH (ref 0.0–0.1)
Basophils Relative: 1 %
Eosinophils Absolute: 0.6 10*3/uL — ABNORMAL HIGH (ref 0.0–0.5)
Eosinophils Relative: 4 %
HCT: 38.9 % — ABNORMAL LOW (ref 39.0–52.0)
Hemoglobin: 12.9 g/dL — ABNORMAL LOW (ref 13.0–17.0)
Immature Granulocytes: 1 %
Lymphocytes Relative: 10 %
Lymphs Abs: 1.3 10*3/uL (ref 0.7–4.0)
MCH: 28.9 pg (ref 26.0–34.0)
MCHC: 33.2 g/dL (ref 30.0–36.0)
MCV: 87 fL (ref 80.0–100.0)
Monocytes Absolute: 1.1 10*3/uL — ABNORMAL HIGH (ref 0.1–1.0)
Monocytes Relative: 9 %
Neutro Abs: 9.8 10*3/uL — ABNORMAL HIGH (ref 1.7–7.7)
Neutrophils Relative %: 75 %
Platelet Count: 532 10*3/uL — ABNORMAL HIGH (ref 150–400)
RBC: 4.47 MIL/uL (ref 4.22–5.81)
RDW: 18.9 % — ABNORMAL HIGH (ref 11.5–15.5)
WBC Count: 13.1 10*3/uL — ABNORMAL HIGH (ref 4.0–10.5)
nRBC: 0.3 % — ABNORMAL HIGH (ref 0.0–0.2)

## 2022-02-20 LAB — FERRITIN: Ferritin: 63 ng/mL (ref 24–336)

## 2022-02-20 LAB — CMP (CANCER CENTER ONLY)
ALT: 23 U/L (ref 0–44)
AST: 25 U/L (ref 15–41)
Albumin: 5.1 g/dL — ABNORMAL HIGH (ref 3.5–5.0)
Alkaline Phosphatase: 44 U/L (ref 38–126)
Anion gap: 9 (ref 5–15)
BUN: 24 mg/dL — ABNORMAL HIGH (ref 8–23)
CO2: 30 mmol/L (ref 22–32)
Calcium: 9.6 mg/dL (ref 8.9–10.3)
Chloride: 101 mmol/L (ref 98–111)
Creatinine: 1.05 mg/dL (ref 0.61–1.24)
GFR, Estimated: >60 mL/min (ref >60)
Glucose, Bld: 96 mg/dL (ref 70–99)
Potassium: 4.1 mmol/L (ref 3.5–5.1)
Sodium: 140 mmol/L (ref 135–145)
Total Bilirubin: 1.1 mg/dL (ref 0.3–1.2)
Total Protein: 6.9 g/dL (ref 6.5–8.1)

## 2022-02-20 LAB — IRON AND IRON BINDING CAPACITY (CC-WL,HP ONLY)
Iron: 139 ug/dL (ref 45–182)
Saturation Ratios: 30 % (ref 17.9–39.5)
TIBC: 458 ug/dL — ABNORMAL HIGH (ref 250–450)
UIBC: 319 ug/dL (ref 117–376)

## 2022-02-20 LAB — SAVE SMEAR(SSMR), FOR PROVIDER SLIDE REVIEW

## 2022-02-20 LAB — LACTATE DEHYDROGENASE: LDH: 443 U/L — ABNORMAL HIGH (ref 98–192)

## 2022-02-20 NOTE — Progress Notes (Signed)
Hematology and Oncology Follow Up Visit  [name] [name] [id number] 1955-12-24 66 y.o. 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  He did have a good time up in [place].  He was up there for about a week or so.  Before he went up there, he did have some kidney stones.  He is gotten considering a cardiac ablation for his atrial fibrillation.  This is most be in early December.  He did have a ultrasound done today was swelling.  Thankfully, the splenic size has decreased.  It is now 724 cm.  Previously, it was 810 cm.  Of note, his last iron studies that were done back in August showed a ferritin of 55 with an iron saturation of 40%.  He has had no problems with nausea or vomiting.  He has had no cough or shortness of breath.  He has had no change in bowel or bladder habits.  There is been no rashes.  He has had no leg swelling.  He continues on the [place].  His platelet count is up a little bit.  We will going to have to watch this closely.  Overall, I was his performance status is probably ECOG 1.     Medications:  Allergies as of 02/20/2022       Reactions   Trazodone Hcl Other (See Comments)   Other reaction(s): too sleepy in AM   Mirtazapine Other (See Comments)   Other reaction(s): too sleepy in the AM   Niacin Itching, Other (See Comments)   Flushed feeling        Medication List        Accurate as of February 20, 2022  9:54 AM. If you have any questions, ask your nurse or doctor.           STOP taking these medications    tamsulosin 0.4 MG Caps capsule Commonly known as: Flomax Stopped by: [name], MD   traMADol 50 MG tablet Commonly known as: ULTRAM Stopped by: [name], MD       TAKE these medications    acetaminophen 500 MG tablet Commonly known as: TYLENOL Take 1,000 mg by mouth every 6 (six) hours as needed for mild pain.   aspirin EC 81 MG tablet Take 1 tablet (81 mg total) by mouth daily.   diltiazem 120 MG 24 hr capsule Commonly known as: CARDIZEM CD Take 1 capsule (120 mg total) by mouth daily.   Eliquis 5 MG Tabs tablet Generic drug: apixaban Take 1 tablet (5 mg total) by mouth 2 (two) times daily.   fluorouracil 5 % cream Commonly known as: EFUDEX Apply 1 application  topically 2 (two) times daily as needed (dry skin).   gabapentin 300 MG capsule Commonly known as: NEURONTIN Take 1 capsule (300 mg total) by mouth 4 (four) times daily. What changed:  how much to take when to take this   Jakafi 10 MG tablet Generic drug: ruxolitinib phosphate TAKE 1 TABLET BY MOUTH 2 TIMES  DAILY.   losartan 100 MG tablet Commonly known as: COZAAR Take 1 tablet (100 mg total) by mouth daily.   melatonin 5 MG Tabs Take 5 mg by mouth at bedtime.   Mens Multivitamin Tabs Take 1 tablet by mouth daily.   propranolol ER 160 MG SR capsule Commonly known as: INDERAL LA Take 1 capsule (160 mg total) by mouth daily.   rosuvastatin 20 MG tablet Commonly known as: CRESTOR Take 1 tablet (20 mg total) by mouth daily.   tadalafil 5 MG tablet Commonly known as: CIALIS Take 1 tablet by mouth daily as needed for erectile dysfunction.   zolpidem 12.5 MG CR tablet Commonly known as: AMBIEN CR Take 6.25 mg by mouth at bedtime as needed for sleep.        Allergies:  Allergies  Allergen Reactions   Trazodone Hcl Other (See Comments)    Other reaction(s): too sleepy in AM   Mirtazapine Other (See Comments)    Other reaction(s): too  sleepy in the AM   Niacin Itching and Other (See Comments)    Flushed feeling     Past Medical History, Surgical history, Social history, and Family History were reviewed and updated.  Review of Systems: Review of Systems  Constitutional: Negative.   HENT: Negative.    Eyes: Negative.   Respiratory: Negative.    Cardiovascular: Negative.   Gastrointestinal: Negative.   Genitourinary: Negative.   Musculoskeletal: Negative.   Skin:  Positive for rash.  Neurological: Negative.   Endo/Heme/Allergies: Negative.   Psychiatric/Behavioral: Negative.       Physical Exam:  height is '5\' 11"'$  (1.803 m) and weight is 201 lb (91.2 kg). His oral temperature is 97.8 F (36.6 C). His blood pressure is 109/67 and his pulse is 50 (abnormal). His respiration is 18 and oxygen saturation is 100%.   Wt Readings from Last 3 Encounters:  02/20/22 201 lb (91.2 kg)  01/17/22 201 lb 1.9 oz (91.2 kg)  01/15/22 195 lb (88.5 kg)    Physical Exam Vitals reviewed.  HENT:     Head: Normocephalic and atraumatic.  Eyes:     Pupils: Pupils are equal, round, and reactive to light.  Cardiovascular:     Rate and Rhythm: Normal rate and regular rhythm.     Heart sounds: Normal heart sounds.  Pulmonary:     Effort: Pulmonary effort is normal.     Breath sounds: Normal breath sounds.  Abdominal:     General: Bowel sounds are normal.     Palpations: Abdomen is soft.     Comments: I cannot palpate his spleen.  Musculoskeletal:        General: No tenderness or deformity. Normal range of motion.     Cervical back: Normal range of motion.  Lymphadenopathy:     Cervical: No cervical adenopathy.  Skin:    General: Skin is warm and dry.     Findings: No erythema or rash.     Comments: He has the healing zoster rash in the left T7 dermatome.  There is some sensitivity to touch in this area.  Neurological:     Mental Status: He is alert and oriented to person, place, and time.  Psychiatric:        Behavior:  Behavior normal.        Thought Content: Thought content normal.        Judgment: Judgment normal.      Lab Results  Component Value Date   WBC 13.1 (H) 02/20/2022  HGB 12.9 (L) 02/20/2022   HCT 38.9 (L) 02/20/2022   MCV 87.0 02/20/2022   PLT 532 (H) 02/20/2022   Lab Results  Component Value Date   FERRITIN 55 12/22/2021   IRON 158 12/22/2021   TIBC 399 12/22/2021   UIBC 241 12/22/2021   IRONPCTSAT 40 (H) 12/22/2021   Lab Results  Component Value Date   RETICCTPCT 3.6 (H) 01/18/2021   RBC 4.47 02/20/2022   RETICCTABS 87.6 02/21/2011   Lab Results  Component Value Date   KPAFRELGTCHN 10.7 09/27/2021   LAMBDASER 8.5 09/27/2021   KAPLAMBRATIO 1.26 09/27/2021   No results found for: "IGGSERUM", "IGA", "IGMSERUM" No results found for: "TOTALPROTELP", "ALBUMINELP", "A1GS", "A2GS", "BETS", "BETA2SER", "GAMS", "MSPIKE", "SPEI"   Chemistry      Component Value Date/Time   NA 140 02/20/2022 0824   NA 144 04/25/2017 1506   NA 139 04/12/2016 1113   K 4.1 02/20/2022 0824   K 4.2 04/25/2017 1506   K 4.1 04/12/2016 1113   CL 101 02/20/2022 0824   CL 105 04/25/2017 1506   CO2 30 02/20/2022 0824   CO2 29 04/25/2017 1506   CO2 22 04/12/2016 1113   BUN 24 (H) 02/20/2022 0824   BUN 17 04/25/2017 1506   BUN 16.3 04/12/2016 1113   CREATININE 1.05 02/20/2022 0824   CREATININE 1.2 04/25/2017 1506   CREATININE 0.9 04/12/2016 1113      Component Value Date/Time   CALCIUM 9.6 02/20/2022 0824   CALCIUM 9.5 04/25/2017 1506   CALCIUM 9.1 04/12/2016 1113   ALKPHOS 44 02/20/2022 0824   ALKPHOS 73 04/25/2017 1506   ALKPHOS 66 04/12/2016 1113   AST 25 02/20/2022 0824   AST 31 04/12/2016 1113   ALT 23 02/20/2022 0824   ALT 58 (H) 04/25/2017 1506   ALT 36 04/12/2016 1113   BILITOT 1.1 02/20/2022 0824   BILITOT 0.73 04/12/2016 1113       Impression and Plan: Mr. Wenger is a very pleasant 66 yo caucasian gentleman with polycythemia vera, JAK2 positive.   Again, the platelet  count is up a little bit.  We will have to be careful with this.  I still do not think we have to make any change in the Jakafi dose.  I am happy that the splenic size is also improving.  I do not see a problem with him having the atrial fibrillation procedure.  I also do not see a problem having vaccinations.  We will plan to get him back probably right after Thanksgiving.  I think this would be a good time to get him back.   [name], MD 10/17/20239:54 AM

## 2022-02-22 ENCOUNTER — Other Ambulatory Visit (HOSPITAL_COMMUNITY): Payer: Self-pay

## 2022-03-07 ENCOUNTER — Other Ambulatory Visit (HOSPITAL_COMMUNITY): Payer: Self-pay
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
## 2022-03-13 ENCOUNTER — Other Ambulatory Visit (HOSPITAL_COMMUNITY): Payer: Self-pay

## 2022-03-14 ENCOUNTER — Ambulatory Visit: Payer: Managed Care, Other (non HMO) | Admitting: Adult Health

## 2022-03-14 ENCOUNTER — Encounter: Payer: Self-pay | Admitting: Adult Health

## 2022-03-14 VITALS — BP 123/73 | HR 54 | Ht 71.0 in | Wt 202.1 lb

## 2022-03-14 DIAGNOSIS — G629 Polyneuropathy, unspecified: Secondary | ICD-10-CM

## 2022-03-14 DIAGNOSIS — Z8673 Personal history of transient ischemic attack (TIA), and cerebral infarction without residual deficits: Secondary | ICD-10-CM | POA: Diagnosis not present

## 2022-03-14 DIAGNOSIS — G44221 Chronic tension-type headache, intractable: Secondary | ICD-10-CM | POA: Diagnosis not present

## 2022-03-14 DIAGNOSIS — G4730 Sleep apnea, unspecified: Secondary | ICD-10-CM

## 2022-03-14 MED ORDER — AMITRIPTYLINE HCL 25 MG PO TABS: 25.0000 mg | ORAL_TABLET | Freq: Every day | ORAL | 3 refills | Status: DC

## 2022-03-14 NOTE — Patient Instructions (Addendum)
Start amitriptyline '25mg'$  nightly for headaches and nerve pain - please hold ambien while taking this  Please let me know after 1 week for possible need of dosage increase  Continue gabapentin '600mg'$  twice daily for nerve pain and headaches   Continue aspirin 81 mg daily and Eliquis (apixaban) daily  and Crestor  for secondary stroke prevention  Continue to follow up with PCP regarding cholesterol and blood pressure management  Maintain strict control of hypertension with blood pressure goal below 130/90 and cholesterol with LDL cholesterol (bad cholesterol) goal below 70 mg/dL.   Signs of a Stroke? Follow the BEFAST method:  Balance Watch for a sudden loss of balance, trouble with coordination or vertigo Eyes Is there a sudden loss of vision in one or both eyes? Or double vision?  Face: Ask the person to smile. Does one side of the face droop or is it numb?  Arms: Ask the person to raise both arms. Does one arm drift downward? Is there weakness or numbness of a leg? Speech: Ask the person to repeat a simple phrase. Does the speech sound slurred/strange? Is the person confused ? Time: If you observe any of these signs, call 911.     Followup in the future with me in 5 months or call earlier if needed     Thank you for coming to see [place] at [facility]. I hope we have been able to provide you high quality care today.  You may receive a patient satisfaction survey over the next few weeks. We would appreciate your feedback and comments so that we may continue to improve ourselves and the health of our patients.

## 2022-03-15 ENCOUNTER — Other Ambulatory Visit (HOSPITAL_COMMUNITY): Payer: Self-pay

## 2022-03-16 ENCOUNTER — Telehealth: Payer: Self-pay

## 2022-03-16 NOTE — Telephone Encounter (Signed)
Called pt to go over instructions for ablation. Advised him that if he decides to go through with this we can get labs done at his visit on 11/20 with Dr. Curt Bears.   He is still unsure he wants to do this so I haven't done Instruction letters yet.

## 2022-03-19 NOTE — Telephone Encounter (Signed)
Followed up w/ pt. Aware we will go over instructions next week if he chooses to proceed with ablation. Patient verbalized understanding and agreeable to plan.

## 2022-03-22 ENCOUNTER — Other Ambulatory Visit (HOSPITAL_COMMUNITY): Payer: Self-pay

## 2022-03-26 ENCOUNTER — Encounter: Payer: Self-pay | Admitting: *Deleted

## 2022-03-26 ENCOUNTER — Ambulatory Visit: Payer: Managed Care, Other (non HMO) | Attending: Cardiology | Admitting: Cardiology

## 2022-03-26 ENCOUNTER — Encounter: Payer: Self-pay | Admitting: Cardiology

## 2022-03-26 VITALS — BP 126/70 | HR 68 | Ht 71.0 in | Wt 203.0 lb

## 2022-03-26 DIAGNOSIS — I10 Essential (primary) hypertension: Secondary | ICD-10-CM | POA: Diagnosis not present

## 2022-03-26 DIAGNOSIS — D6869 Other thrombophilia: Secondary | ICD-10-CM | POA: Diagnosis not present

## 2022-03-26 DIAGNOSIS — I480 Paroxysmal atrial fibrillation: Secondary | ICD-10-CM

## 2022-03-26 NOTE — Patient Instructions (Signed)
Medication Instructions:  Your physician recommends that you continue on your current medications as directed. Please refer to the Current Medication list given to you today.  *If you need a refill on your cardiac medications before your next appointment, please call your pharmacy*   Lab Work: Pre procedure labs -- see procedure instruction letter:  BMP & CBC  If you have labs (blood work) drawn today and your tests are completely normal, you will receive your results only by: [place] (if you have MyChart) OR A paper copy in the mail If you have any lab test that is abnormal or we need to change your treatment, we will call you to review the results.   Testing/Procedures: Your physician has requested that you have cardiac CT within 7 days PRIOR to your ablation. Cardiac computed tomography (CT) is a painless test that uses an x-ray machine to take clear, detailed pictures of your heart.  Please follow instruction below located under "other instructions". You will get a call from our office to schedule the date for this test.  Your physician has recommended that you have an ablation. Catheter ablation is a medical procedure used to treat some cardiac arrhythmias (irregular heartbeats). During catheter ablation, a long, thin, flexible tube is put into a blood vessel in your groin (upper thigh), or neck. This tube is called an ablation catheter. It is then guided to your heart through the blood vessel. Radio frequency waves destroy small areas of heart tissue where abnormal heartbeats may cause an arrhythmia to start. Please follow instruction letter given to you today.   Follow-Up: At [facility], you and your health needs are our priority.  As part of our continuing mission to provide you with exceptional heart care, we have created designated Provider Care Teams.  These Care Teams include your primary Cardiologist (physician) and Advanced Practice Providers (APPs -  Physician  Assistants and Nurse Practitioners) who all work together to provide you with the care you need, when you need it.  Your next appointment:   1 month(s) after your ablation  The format for your next appointment:   In Person  Provider:   AFib clinic   Thank you for choosing [facility]!!   Trinidad Curet, RN [phone]    Other Instructions   Cardiac Ablation Cardiac ablation is a procedure to destroy (ablate) some heart tissue that is sending bad signals. These bad signals cause problems in heart rhythm. The heart has many areas that make these signals. If there are problems in these areas, they can make the heart beat in a way that is not normal. Destroying some tissues can help make the heart rhythm normal. Tell your doctor about: Any allergies you have. All medicines you are taking. These include vitamins, herbs, eye drops, creams, and over-the-counter medicines. Any problems you or family members have had with medicines that make you fall asleep (anesthetics). Any blood disorders you have. Any surgeries you have had. Any medical conditions you have, such as kidney failure. Whether you are pregnant or may be pregnant. What are the risks? This is a safe procedure. But problems may occur, including: Infection. Bruising and bleeding. Bleeding into the chest. Stroke or blood clots. Damage to nearby areas of your body. Allergies to medicines or dyes. The need for a pacemaker if the normal system is damaged. Failure of the procedure to treat the problem. What happens before the procedure? Medicines Ask your doctor about: Changing or stopping your normal medicines. This  is important. Taking aspirin and ibuprofen. Do not take these medicines unless your doctor tells you to take them. Taking other medicines, vitamins, herbs, and supplements. General instructions Follow instructions from your doctor about what you cannot eat or drink. Plan to have someone take you  home from the hospital or clinic. If you will be going home right after the procedure, plan to have someone with you for 24 hours. Ask your doctor what steps will be taken to prevent infection. What happens during the procedure?  An IV tube will be put into one of your veins. You will be given a medicine to help you relax. The skin on your neck or groin will be numbed. A cut (incision) will be made in your neck or groin. A needle will be put through your cut and into a large vein. A tube (catheter) will be put into the needle. The tube will be moved to your heart. Dye may be put through the tube. This helps your doctor see your heart. Small devices (electrodes) on the tube will send out signals. A type of energy will be used to destroy some heart tissue. The tube will be taken out. Pressure will be held on your cut. This helps stop bleeding. A bandage will be put over your cut. The exact procedure may vary among doctors and hospitals. What happens after the procedure? You will be watched until you leave the hospital or clinic. This includes checking your heart rate, breathing rate, oxygen, and blood pressure. Your cut will be watched for bleeding. You will need to lie still for a few hours. Do not drive for 24 hours or as long as your doctor tells you. Summary Cardiac ablation is a procedure to destroy some heart tissue. This is done to treat heart rhythm problems. Tell your doctor about any medical conditions you may have. Tell him or her about all medicines you are taking to treat them. This is a safe procedure. But problems may occur. These include infection, bruising, bleeding, and damage to nearby areas of your body. Follow what your doctor tells you about food and drink. You may also be told to change or stop some of your medicines. After the procedure, do not drive for 24 hours or as long as your doctor tells you. This information is not intended to replace advice given to you by  your health care provider. Make sure you discuss any questions you have with your health care provider. Document Revised: 07/14/2021 Document Reviewed: 03/26/2019 Elsevier Patient Education  [place].

## 2022-03-26 NOTE — Progress Notes (Signed)
Electrophysiology Office Note   Date:  03/26/2022   ID:  [name] [name], DOB [birth year], MRN [id number]  PCP:  Donnajean Lopes, MD  Cardiologist:  Stanford Breed Primary Electrophysiologist:  Jonnie Truxillo [name], MD    Chief Complaint: AF   History of Present Illness: [name] [name] is a 66 y.o. male who is being seen today for the evaluation of AF at the request of Donnajean Lopes, MD. Presenting today for electrophysiology evaluation.  He has a history significant for hypertension hyperlipidemia, CVA, atrial fibrillation.  CVA was May 2020.  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  He presented to the hospital 10/17/2021 with rapid atrial fibrillation.  He had sudden onset of brief cramping in his left upper chest.  He developed palpitations and started feeling poorly.  He converted to sinus rhythm while in the hospital without need for intervention.  He is planned for atrial fibrillation ablation 04/13/2022.  Today, denies symptoms of palpitations, chest pain, shortness of breath, orthopnea, PND, lower extremity edema, claudication, dizziness, presyncope, syncope, bleeding, or neurologic sequela. The patient is tolerating medications without difficulties.      Past Medical History:  Diagnosis Date   Diverticulosis of colon (without mention of hemorrhage)    Hemorrhoids    Hypertension    IBS (irritable bowel syndrome)    Iron deficiency anemia due to chronic blood loss 09/26/2017   Irregular heartbeat    Nephrolithiasis    Other and unspecified hyperlipidemia    Perirectal fistula    Polycythemia, secondary    Stroke (HCC)    Stye    blocker duct right eye lid   Past Surgical History:  Procedure Laterality Date   RETINAL DETACHMENT SURGERY     ROTATOR CUFF REPAIR     rt.   VASECTOMY       Current Outpatient Medications  Medication Sig Dispense Refill   acetaminophen (TYLENOL) 500 MG tablet Take 1,000 mg by mouth every 6 (six) hours as needed for mild pain.       amitriptyline (ELAVIL) 25 MG tablet Take 1 tablet (25 mg total) by mouth at bedtime. 30 tablet 3   apixaban (ELIQUIS) 5 MG TABS tablet Take 1 tablet (5 mg total) by mouth 2 (two) times daily. 180 tablet 3   aspirin EC 81 MG EC tablet Take 1 tablet (81 mg total) by mouth daily. 60 tablet 0   diltiazem (CARDIZEM CD) 120 MG 24 hr capsule Take 1 capsule (120 mg total) by mouth daily. 90 capsule 3   fluorouracil (EFUDEX) 5 % cream Apply 1 application  topically 2 (two) times daily as needed (dry skin).     gabapentin (NEURONTIN) 300 MG capsule Take 1 capsule (300 mg total) by mouth 4 (four) times daily. (Patient taking differently: Take 600 mg by mouth 2 (two) times daily.) 360 capsule 3   losartan (COZAAR) 100 MG tablet Take 1 tablet (100 mg total) by mouth daily. 90 tablet 3   melatonin 5 MG TABS Take 5 mg by mouth at bedtime.     Multiple Vitamins-Minerals (MENS MULTIVITAMIN) TABS Take 1 tablet by mouth daily.     propranolol ER (INDERAL LA) 160 MG SR capsule Take 1 capsule (160 mg total) by mouth daily. 90 capsule 3   rosuvastatin (CRESTOR) 20 MG tablet Take 1 tablet (20 mg total) by mouth daily. 90 tablet 3   ruxolitinib phosphate (JAKAFI) 10 MG tablet TAKE 1 TABLET BY MOUTH 2 TIMES DAILY. 60 tablet 4  tadalafil (CIALIS) 5 MG tablet Take 1 tablet by mouth daily as needed for erectile dysfunction.     zolpidem (AMBIEN CR) 12.5 MG CR tablet Take 6.25 mg by mouth at bedtime as needed for sleep. (Patient not taking: Reported on 03/26/2022)     No current facility-administered medications for this visit.    Allergies:   Trazodone hcl, Mirtazapine, and Niacin   Social History:  The patient  reports that he has never smoked. He has never used smokeless tobacco. He reports current alcohol use. He reports that he does not use drugs.   Family History:  The patient's family history includes Aortic aneurysm in his father; Cerebral palsy in his brother; Heart attack in his brother.   ROS:  Please  see the history of present illness.   Otherwise, review of systems is positive for none.   All other systems are reviewed and negative.   PHYSICAL EXAM: VS:  BP 126/70   Pulse 68   Ht '5\' 11"'$  (1.803 m)   Wt 203 lb (92.1 kg)   SpO2 95%   BMI 28.31 kg/m  , BMI Body mass index is 28.31 kg/m. GEN: Well nourished, well developed, in no acute distress  HEENT: normal  Neck: no JVD, carotid bruits, or masses Cardiac: RRR; no murmurs, rubs, or gallops,no edema  Respiratory:  clear to auscultation bilaterally, normal work of breathing GI: soft, nontender, nondistended, + BS MS: no deformity or atrophy  Skin: warm and dry Neuro:  Strength and sensation are intact Psych: euthymic mood, full affect  EKG:  EKG is ordered today. Personal review of the ekg ordered shows sinus rhythm  Recent Labs: 10/17/2021: Magnesium 2.0; TSH 1.713 02/20/2022: ALT 23; BUN 24; Creatinine 1.05; Hemoglobin 12.9; Platelet Count 532; Potassium 4.1; Sodium 140    Lipid Panel     Component Value Date/Time   CHOL 116 12/22/2021 0000   TRIG 155 (H) 12/22/2021 0000   HDL 38 (L) 12/22/2021 0000   CHOLHDL 3.1 12/22/2021 0000   CHOLHDL 5.7 09/13/2018 0634   VLDL 41 (H) 09/13/2018 0634   LDLCALC 52 12/22/2021 0000     Wt Readings from Last 3 Encounters:  03/26/22 203 lb (92.1 kg)  03/14/22 202 lb 2 oz (91.7 kg)  02/20/22 201 lb (91.2 kg)      Other studies Reviewed: Additional studies/ records that were reviewed today include: TTE 09/07/21  Review of the above records today demonstrates:   1. Left ventricular ejection fraction, by estimation, is 55 to 60%. Left  ventricular ejection fraction by 3D volume is 58 %. The left ventricle has  normal function. The left ventricle has no regional wall motion  abnormalities. Left ventricular diastolic   parameters are consistent with Grade I diastolic dysfunction (impaired  relaxation). The average left ventricular global longitudinal strain is  -21.3 %. The global  longitudinal strain is normal.   2. Right ventricular systolic function is normal. The right ventricular  size is mildly enlarged. Tricuspid regurgitation signal is inadequate for  assessing PA pressure.   3. The mitral valve is normal in structure. Trivial mitral valve  regurgitation. No evidence of mitral stenosis.   4. The aortic valve is normal in structure. Aortic valve regurgitation is  not visualized. No aortic stenosis is present.   5. Aortic dilatation noted. There is mild dilatation of the ascending  aorta, measuring 39 mm.    ASSESSMENT AND PLAN:  1.  Paroxysmal atrial fibrillation: Currently on Eliquis 5 mg twice daily.  CHA2DS2-VASc 3 and he has not had any symptoms since last summer.Marland Kitchen  Despite that, he has elected to continue to move forward with his ablation.  Risk and benefits of been discussed.  He understands these risks and is agreed to the procedure.  Risk, benefits, and alternatives to EP study and radiofrequency ablation for afib were also discussed in detail today. These risks include but are not limited to stroke, bleeding, vascular damage, tamponade, perforation, damage to the esophagus, lungs, and other structures, pulmonary vein stenosis, worsening renal function, and death. The patient understands these risk and wishes to proceed.  We Zelphia Glover therefore proceed with catheter ablation at the next available time.  Carto, ICE, anesthesia are requested for the procedure.  Elynore Dolinski also obtain CT PV protocol prior to the procedure to exclude LAA thrombus and further evaluate atrial anatomy.   2.  Hypertension: Currently well controlled  3.  Hyperlipidemia: Continue statin per primary cardiology  4 secondary hypercoagulable state: Currently on Eliquis for atrial fibrillation as above  Current medicines are reviewed at length with the patient today.   The patient does not have concerns regarding his medicines.  The following changes were made today:  none  Labs/ tests ordered  today include:  Orders Placed This Encounter  Procedures   CT CARDIAC MORPH/PULM VEIN W/CM&W/O CA SCORE   EKG 12-Lead     Disposition:   FU with Eland Lamantia 3 months  Signed, [name] Mantz [name], MD  03/26/2022 4:38 PM     [address] [place] [address] [phone] (office) [phone] (fax)

## 2022-03-26 NOTE — H&P (View-Only) (Signed)
Electrophysiology Office Note   Date:  03/26/2022   ID:  Naveed Humphres, DOB Oct 30, 1955, MRN [id number]  PCP:  Donnajean Lopes, MD  Cardiologist:  Stanford Breed Primary Electrophysiologist:  Brittley Regner [name], MD    Chief Complaint: AF   History of Present Illness: Bianca Vester is a 66 y.o. male who is being seen today for the evaluation of AF at the request of Donnajean Lopes, MD. Presenting today for electrophysiology evaluation.  He has a history significant for hypertension hyperlipidemia, CVA, atrial fibrillation.  CVA was May 2020.  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  He presented to the hospital 10/17/2021 with rapid atrial fibrillation.  He had sudden onset of brief cramping in his left upper chest.  He developed palpitations and started feeling poorly.  He converted to sinus rhythm while in the hospital without need for intervention.  He is planned for atrial fibrillation ablation 04/13/2022.  Today, denies symptoms of palpitations, chest pain, shortness of breath, orthopnea, PND, lower extremity edema, claudication, dizziness, presyncope, syncope, bleeding, or neurologic sequela. The patient is tolerating medications without difficulties.      Past Medical History:  Diagnosis Date   Diverticulosis of colon (without mention of hemorrhage)    Hemorrhoids    Hypertension    IBS (irritable bowel syndrome)    Iron deficiency anemia due to chronic blood loss 09/26/2017   Irregular heartbeat    Nephrolithiasis    Other and unspecified hyperlipidemia    Perirectal fistula    Polycythemia, secondary    Stroke (HCC)    Stye    blocker duct right eye lid   Past Surgical History:  Procedure Laterality Date   RETINAL DETACHMENT SURGERY     ROTATOR CUFF REPAIR     rt.   VASECTOMY       Current Outpatient Medications  Medication Sig Dispense Refill   acetaminophen (TYLENOL) 500 MG tablet Take 1,000 mg by mouth every 6 (six) hours as needed for mild pain.       amitriptyline (ELAVIL) 25 MG tablet Take 1 tablet (25 mg total) by mouth at bedtime. 30 tablet 3   apixaban (ELIQUIS) 5 MG TABS tablet Take 1 tablet (5 mg total) by mouth 2 (two) times daily. 180 tablet 3   aspirin EC 81 MG EC tablet Take 1 tablet (81 mg total) by mouth daily. 60 tablet 0   diltiazem (CARDIZEM CD) 120 MG 24 hr capsule Take 1 capsule (120 mg total) by mouth daily. 90 capsule 3   fluorouracil (EFUDEX) 5 % cream Apply 1 application  topically 2 (two) times daily as needed (dry skin).     gabapentin (NEURONTIN) 300 MG capsule Take 1 capsule (300 mg total) by mouth 4 (four) times daily. (Patient taking differently: Take 600 mg by mouth 2 (two) times daily.) 360 capsule 3   losartan (COZAAR) 100 MG tablet Take 1 tablet (100 mg total) by mouth daily. 90 tablet 3   melatonin 5 MG TABS Take 5 mg by mouth at bedtime.     Multiple Vitamins-Minerals (MENS MULTIVITAMIN) TABS Take 1 tablet by mouth daily.     propranolol ER (INDERAL LA) 160 MG SR capsule Take 1 capsule (160 mg total) by mouth daily. 90 capsule 3   rosuvastatin (CRESTOR) 20 MG tablet Take 1 tablet (20 mg total) by mouth daily. 90 tablet 3   ruxolitinib phosphate (JAKAFI) 10 MG tablet TAKE 1 TABLET BY MOUTH 2 TIMES DAILY. 60 tablet 4  tadalafil (CIALIS) 5 MG tablet Take 1 tablet by mouth daily as needed for erectile dysfunction.     zolpidem (AMBIEN CR) 12.5 MG CR tablet Take 6.25 mg by mouth at bedtime as needed for sleep. (Patient not taking: Reported on 03/26/2022)     No current facility-administered medications for this visit.    Allergies:   Trazodone hcl, Mirtazapine, and Niacin   Social History:  The patient  reports that he has never smoked. He has never used smokeless tobacco. He reports current alcohol use. He reports that he does not use drugs.   Family History:  The patient's family history includes Aortic aneurysm in his father; Cerebral palsy in his brother; Heart attack in his brother.   ROS:  Please  see the history of present illness.   Otherwise, review of systems is positive for none.   All other systems are reviewed and negative.   PHYSICAL EXAM: VS:  BP 126/70   Pulse 68   Ht '5\' 11"'$  (1.803 m)   Wt 203 lb (92.1 kg)   SpO2 95%   BMI 28.31 kg/m  , BMI Body mass index is 28.31 kg/m. GEN: Well nourished, well developed, in no acute distress  HEENT: normal  Neck: no JVD, carotid bruits, or masses Cardiac: RRR; no murmurs, rubs, or gallops,no edema  Respiratory:  clear to auscultation bilaterally, normal work of breathing GI: soft, nontender, nondistended, + BS MS: no deformity or atrophy  Skin: warm and dry Neuro:  Strength and sensation are intact Psych: euthymic mood, full affect  EKG:  EKG is ordered today. Personal review of the ekg ordered shows sinus rhythm  Recent Labs: 10/17/2021: Magnesium 2.0; TSH 1.713 02/20/2022: ALT 23; BUN 24; Creatinine 1.05; Hemoglobin 12.9; Platelet Count 532; Potassium 4.1; Sodium 140    Lipid Panel     Component Value Date/Time   CHOL 116 12/22/2021 0000   TRIG 155 (H) 12/22/2021 0000   HDL 38 (L) 12/22/2021 0000   CHOLHDL 3.1 12/22/2021 0000   CHOLHDL 5.7 09/13/2018 0634   VLDL 41 (H) 09/13/2018 0634   LDLCALC 52 12/22/2021 0000     Wt Readings from Last 3 Encounters:  03/26/22 203 lb (92.1 kg)  03/14/22 202 lb 2 oz (91.7 kg)  02/20/22 201 lb (91.2 kg)      Other studies Reviewed: Additional studies/ records that were reviewed today include: TTE 09/07/21  Review of the above records today demonstrates:   1. Left ventricular ejection fraction, by estimation, is 55 to 60%. Left  ventricular ejection fraction by 3D volume is 58 %. The left ventricle has  normal function. The left ventricle has no regional wall motion  abnormalities. Left ventricular diastolic   parameters are consistent with Grade I diastolic dysfunction (impaired  relaxation). The average left ventricular global longitudinal strain is  -21.3 %. The global  longitudinal strain is normal.   2. Right ventricular systolic function is normal. The right ventricular  size is mildly enlarged. Tricuspid regurgitation signal is inadequate for  assessing PA pressure.   3. The mitral valve is normal in structure. Trivial mitral valve  regurgitation. No evidence of mitral stenosis.   4. The aortic valve is normal in structure. Aortic valve regurgitation is  not visualized. No aortic stenosis is present.   5. Aortic dilatation noted. There is mild dilatation of the ascending  aorta, measuring 39 mm.    ASSESSMENT AND PLAN:  1.  Paroxysmal atrial fibrillation: Currently on Eliquis 5 mg twice daily.  CHA2DS2-VASc 3 and he has not had any symptoms since last summer.Marland Kitchen  Despite that, he has elected to continue to move forward with his ablation.  Risk and benefits of been discussed.  He understands these risks and is agreed to the procedure.  Risk, benefits, and alternatives to EP study and radiofrequency ablation for afib were also discussed in detail today. These risks include but are not limited to stroke, bleeding, vascular damage, tamponade, perforation, damage to the esophagus, lungs, and other structures, pulmonary vein stenosis, worsening renal function, and death. The patient understands these risk and wishes to proceed.  We Shaune Westfall therefore proceed with catheter ablation at the next available time.  Carto, ICE, anesthesia are requested for the procedure.  Gresia Isidoro also obtain CT PV protocol prior to the procedure to exclude LAA thrombus and further evaluate atrial anatomy.   2.  Hypertension: Currently well controlled  3.  Hyperlipidemia: Continue statin per primary cardiology  4 secondary hypercoagulable state: Currently on Eliquis for atrial fibrillation as above  Current medicines are reviewed at length with the patient today.   The patient does not have concerns regarding his medicines.  The following changes were made today:  none  Labs/ tests ordered  today include:  Orders Placed This Encounter  Procedures   CT CARDIAC MORPH/PULM VEIN W/CM&W/O CA SCORE   EKG 12-Lead     Disposition:   FU with Khyrie Masi 3 months  Signed, Cece Milhouse [name], MD  03/26/2022 4:38 PM     [address] [place] [address] [phone] (office) [phone] (fax)

## 2022-03-27 ENCOUNTER — Other Ambulatory Visit (HOSPITAL_COMMUNITY): Payer: Self-pay

## 2022-03-28 ENCOUNTER — Other Ambulatory Visit (HOSPITAL_COMMUNITY): Payer: Self-pay

## 2022-03-28 ENCOUNTER — Encounter (HOSPITAL_COMMUNITY): Payer: Self-pay

## 2022-04-03 ENCOUNTER — Inpatient Hospital Stay: Payer: Managed Care, Other (non HMO) | Attending: Hematology & Oncology

## 2022-04-03 ENCOUNTER — Inpatient Hospital Stay (HOSPITAL_BASED_OUTPATIENT_CLINIC_OR_DEPARTMENT_OTHER): Payer: Managed Care, Other (non HMO) | Admitting: Hematology & Oncology

## 2022-04-03 ENCOUNTER — Encounter: Payer: Self-pay | Admitting: Hematology & Oncology

## 2022-04-03 VITALS — BP 121/75 | HR 58 | Temp 97.8°F | Resp 18 | Wt 204.8 lb

## 2022-04-03 DIAGNOSIS — Z7901 Long term (current) use of anticoagulants: Secondary | ICD-10-CM | POA: Insufficient documentation

## 2022-04-03 DIAGNOSIS — Z8673 Personal history of transient ischemic attack (TIA), and cerebral infarction without residual deficits: Secondary | ICD-10-CM | POA: Insufficient documentation

## 2022-04-03 DIAGNOSIS — D500 Iron deficiency anemia secondary to blood loss (chronic): Secondary | ICD-10-CM | POA: Diagnosis not present

## 2022-04-03 DIAGNOSIS — D45 Polycythemia vera: Secondary | ICD-10-CM | POA: Insufficient documentation

## 2022-04-03 DIAGNOSIS — E611 Iron deficiency: Secondary | ICD-10-CM | POA: Insufficient documentation

## 2022-04-03 DIAGNOSIS — D751 Secondary polycythemia: Secondary | ICD-10-CM | POA: Diagnosis not present

## 2022-04-03 LAB — CMP (CANCER CENTER ONLY)
ALT: 23 U/L (ref 0–44)
AST: 29 U/L (ref 15–41)
Albumin: 4.9 g/dL (ref 3.5–5.0)
Alkaline Phosphatase: 43 U/L (ref 38–126)
Anion gap: 7 (ref 5–15)
BUN: 18 mg/dL (ref 8–23)
CO2: 30 mmol/L (ref 22–32)
Calcium: 9.5 mg/dL (ref 8.9–10.3)
Chloride: 105 mmol/L (ref 98–111)
Creatinine: 1.12 mg/dL (ref 0.61–1.24)
GFR, Estimated: >60 mL/min (ref >60)
Glucose, Bld: 101 mg/dL — ABNORMAL HIGH (ref 70–99)
Potassium: 4.1 mmol/L (ref 3.5–5.1)
Sodium: 142 mmol/L (ref 135–145)
Total Bilirubin: 0.7 mg/dL (ref 0.3–1.2)
Total Protein: 6.4 g/dL — ABNORMAL LOW (ref 6.5–8.1)

## 2022-04-03 LAB — CBC WITH DIFFERENTIAL (CANCER CENTER ONLY)
Abs Immature Granulocytes: 0.07 10*3/uL (ref 0.00–0.07)
Basophils Absolute: 0.1 10*3/uL (ref 0.0–0.1)
Basophils Relative: 2 %
Eosinophils Absolute: 0.4 10*3/uL (ref 0.0–0.5)
Eosinophils Relative: 5 %
HCT: 38.6 % — ABNORMAL LOW (ref 39.0–52.0)
Hemoglobin: 12.5 g/dL — ABNORMAL LOW (ref 13.0–17.0)
Immature Granulocytes: 1 %
Lymphocytes Relative: 11 %
Lymphs Abs: 0.9 10*3/uL (ref 0.7–4.0)
MCH: 28.2 pg (ref 26.0–34.0)
MCHC: 32.4 g/dL (ref 30.0–36.0)
MCV: 86.9 fL (ref 80.0–100.0)
Monocytes Absolute: 0.7 10*3/uL (ref 0.1–1.0)
Monocytes Relative: 8 %
Neutro Abs: 5.8 10*3/uL (ref 1.7–7.7)
Neutrophils Relative %: 73 %
Platelet Count: 411 10*3/uL — ABNORMAL HIGH (ref 150–400)
RBC: 4.44 MIL/uL (ref 4.22–5.81)
RDW: 18.1 % — ABNORMAL HIGH (ref 11.5–15.5)
WBC Count: 8 10*3/uL (ref 4.0–10.5)
nRBC: 0.6 % — ABNORMAL HIGH (ref 0.0–0.2)

## 2022-04-03 LAB — IRON AND IRON BINDING CAPACITY (CC-WL,HP ONLY)
Iron: 114 ug/dL (ref 45–182)
Saturation Ratios: 28 % (ref 17.9–39.5)
TIBC: 402 ug/dL (ref 250–450)
UIBC: 288 ug/dL (ref 117–376)

## 2022-04-03 LAB — SAVE SMEAR(SSMR), FOR PROVIDER SLIDE REVIEW

## 2022-04-03 LAB — RETICULOCYTES
Immature Retic Fract: 21.1 % — ABNORMAL HIGH (ref 2.3–15.9)
RBC.: 4.4 MIL/uL (ref 4.22–5.81)
Retic Count, Absolute: 108.7 10*3/uL (ref 19.0–186.0)
Retic Ct Pct: 2.5 % (ref 0.4–3.1)

## 2022-04-03 LAB — FERRITIN: Ferritin: 57 ng/mL (ref 24–336)

## 2022-04-03 LAB — LACTATE DEHYDROGENASE: LDH: 460 U/L — ABNORMAL HIGH (ref 98–192)

## 2022-04-03 NOTE — Progress Notes (Signed)
Hematology and Oncology Follow Up Visit  [name] [name] [id number] Jul 29, 1955 66 y.o. 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  He is doing quite well.  He will undergo cardiac ablation.  This will be on December 8.  He had a nice Thanksgiving.  It was a fairly quiet Thanksgiving.  He has had no problems with abdominal pain.  He says his legs got weak recently.  This happened after he was working on a car.  He said he was squatting down.  When he got back up, he says his legs got little bit weak.  He did not fall.  He says that they eventually got better.  He has had no cough or shortness of breath.  He has had no problems with leg swelling.  He has had no rashes.  He has had no diarrhea or change in bowel or bladder habits.  Overall, I would say his performance status is ECOG 0.      Medications:  Allergies as of 04/03/2022       Reactions   Trazodone Hcl Other (See Comments)   Other reaction(s): too sleepy in AM   Mirtazapine Other (See Comments)   Other reaction(s): too sleepy in the AM   Niacin Itching, Other (See Comments)   Flushed feeling        Medication List        Accurate as of April 03, 2022  9:10 AM. If you have any questions, ask your nurse or doctor.          acetaminophen 500 MG tablet Commonly known as: TYLENOL Take 1,000 mg by mouth every 6 (six) hours as needed for mild pain.   amitriptyline 25 MG tablet Commonly known as: ELAVIL Take 1 tablet (25 mg total) by mouth at  bedtime.   aspirin EC 81 MG tablet Take 1 tablet (81 mg total) by mouth daily.   diltiazem 120 MG 24 hr capsule Commonly known as: CARDIZEM CD Take 1 capsule (120 mg total) by mouth daily.   Eliquis 5 MG Tabs tablet Generic drug: apixaban Take 1 tablet (5 mg total) by mouth 2 (two) times daily.   fluorouracil 5 % cream Commonly known as: EFUDEX Apply 1 application  topically 2 (two) times daily as needed (dry skin).   gabapentin 300 MG capsule Commonly known as: NEURONTIN Take 1 capsule (300 mg total) by mouth 4 (four) times daily. What changed:  how much to take when to take this   Jakafi 10 MG tablet Generic drug: ruxolitinib phosphate TAKE 1 TABLET BY MOUTH 2 TIMES DAILY.   losartan 100 MG tablet Commonly known as: COZAAR Take 1 tablet (100 mg total) by mouth daily.   melatonin 5 MG Tabs Take 5 mg by mouth at bedtime.   Mens Multivitamin Tabs Take 1 tablet by mouth daily.   propranolol ER 160 MG SR capsule Commonly known as: INDERAL LA Take 1 capsule (160 mg total) by mouth daily.   rosuvastatin 20 MG tablet Commonly  known as: CRESTOR Take 1 tablet (20 mg total) by mouth daily.   tadalafil 5 MG tablet Commonly known as: CIALIS Take 1 tablet by mouth daily as needed for erectile dysfunction.   zolpidem 12.5 MG CR tablet Commonly known as: AMBIEN CR Take 6.25 mg by mouth at bedtime as needed for sleep.        Allergies:  Allergies  Allergen Reactions   Trazodone Hcl Other (See Comments)    Other reaction(s): too sleepy in AM   Mirtazapine Other (See Comments)    Other reaction(s): too sleepy in the AM   Niacin Itching and Other (See Comments)    Flushed feeling     Past Medical History, Surgical history, Social history, and Family History were reviewed and updated.  Review of Systems: Review of Systems  Constitutional: Negative.   HENT: Negative.    Eyes: Negative.   Respiratory: Negative.    Cardiovascular: Negative.   Gastrointestinal:  Negative.   Genitourinary: Negative.   Musculoskeletal: Negative.   Skin:  Positive for rash.  Neurological: Negative.   Endo/Heme/Allergies: Negative.   Psychiatric/Behavioral: Negative.       Physical Exam:  weight is 204 lb 12.8 oz (92.9 kg). His oral temperature is 97.8 F (36.6 C). His blood pressure is 121/75 and his pulse is 58 (abnormal). His respiration is 18 and oxygen saturation is 99%.   Wt Readings from Last 3 Encounters:  04/03/22 204 lb 12.8 oz (92.9 kg)  03/26/22 203 lb (92.1 kg)  03/14/22 202 lb 2 oz (91.7 kg)    Physical Exam Vitals reviewed.  HENT:     Head: Normocephalic and atraumatic.  Eyes:     Pupils: Pupils are equal, round, and reactive to light.  Cardiovascular:     Rate and Rhythm: Normal rate and regular rhythm.     Heart sounds: Normal heart sounds.  Pulmonary:     Effort: Pulmonary effort is normal.     Breath sounds: Normal breath sounds.  Abdominal:     General: Bowel sounds are normal.     Palpations: Abdomen is soft.     Comments: I cannot palpate his spleen.  Musculoskeletal:        General: No tenderness or deformity. Normal range of motion.     Cervical back: Normal range of motion.  Lymphadenopathy:     Cervical: No cervical adenopathy.  Skin:    General: Skin is warm and dry.     Findings: No erythema or rash.     Comments: He has the healing zoster rash in the left T7 dermatome.  There is some sensitivity to touch in this area.  Neurological:     Mental Status: He is alert and oriented to person, place, and time.  Psychiatric:        Behavior: Behavior normal.        Thought Content: Thought content normal.        Judgment: Judgment normal.     Lab Results  Component Value Date   WBC 8.0 04/03/2022   HGB 12.5 (L) 04/03/2022   HCT 38.6 (L) 04/03/2022   MCV 86.9 04/03/2022   PLT 411 (H) 04/03/2022   Lab Results  Component Value Date   FERRITIN 63 02/20/2022   IRON 139 02/20/2022   TIBC 458 (H) 02/20/2022   UIBC  319 02/20/2022   IRONPCTSAT 30 02/20/2022   Lab Results  Component Value Date   RETICCTPCT 2.5 04/03/2022   RBC 4.40 04/03/2022   RBC  4.44 04/03/2022   RETICCTABS 87.6 02/21/2011   Lab Results  Component Value Date   KPAFRELGTCHN 10.7 09/27/2021   LAMBDASER 8.5 09/27/2021   KAPLAMBRATIO 1.26 09/27/2021   No results found for: "IGGSERUM", "IGA", "IGMSERUM" No results found for: "TOTALPROTELP", "ALBUMINELP", "A1GS", "A2GS", "BETS", "BETA2SER", "GAMS", "MSPIKE", "SPEI"   Chemistry      Component Value Date/Time   NA 142 04/03/2022 0821   NA 144 04/25/2017 1506   NA 139 04/12/2016 1113   K 4.1 04/03/2022 0821   K 4.2 04/25/2017 1506   K 4.1 04/12/2016 1113   CL 105 04/03/2022 0821   CL 105 04/25/2017 1506   CO2 30 04/03/2022 0821   CO2 29 04/25/2017 1506   CO2 22 04/12/2016 1113   BUN 18 04/03/2022 0821   BUN 17 04/25/2017 1506   BUN 16.3 04/12/2016 1113   CREATININE 1.12 04/03/2022 0821   CREATININE 1.2 04/25/2017 1506   CREATININE 0.9 04/12/2016 1113      Component Value Date/Time   CALCIUM 9.5 04/03/2022 0821   CALCIUM 9.5 04/25/2017 1506   CALCIUM 9.1 04/12/2016 1113   ALKPHOS 43 04/03/2022 0821   ALKPHOS 73 04/25/2017 1506   ALKPHOS 66 04/12/2016 1113   AST 29 04/03/2022 0821   AST 31 04/12/2016 1113   ALT 23 04/03/2022 0821   ALT 58 (H) 04/25/2017 1506   ALT 36 04/12/2016 1113   BILITOT 0.7 04/03/2022 0821   BILITOT 0.73 04/12/2016 1113       Impression and Plan: Mr. Chanthavong is a very pleasant 66 yo caucasian gentleman with polycythemia vera, JAK2 positive.   I am happy that his blood counts got back down.  Particular, his platelet count is doing much better.  As such, we do not have to change the Jakafi dose.  I have no problems with him having the cardiac ablation.  I do not see any problem with him having this done.  He is on Eliquis already.  I think we can move his appointments out a little bit longer now.  I will try to get him back now after  the Christmas holiday.   [name], MD 11/28/20239:10 AM

## 2022-04-06 ENCOUNTER — Other Ambulatory Visit: Payer: Self-pay | Admitting: Adult Health

## 2022-04-06 ENCOUNTER — Telehealth (HOSPITAL_COMMUNITY): Payer: Self-pay | Admitting: Emergency Medicine

## 2022-04-06 NOTE — Telephone Encounter (Signed)
Reaching out to patient to offer assistance regarding upcoming cardiac imaging study; pt verbalizes understanding of appt date/time, parking situation and where to check in, pre-test NPO status and medications ordered, and verified current allergies; name and call back number provided for further questions should they arise Marchia Bond RN Navigator Cardiac Imaging [facility] [phone] office [phone] cell  Arrival 730 Denies iv issues Daily meds per usual

## 2022-04-09 ENCOUNTER — Other Ambulatory Visit: Payer: Self-pay | Admitting: Adult Health

## 2022-04-09 ENCOUNTER — Ambulatory Visit (HOSPITAL_COMMUNITY)
Admission: RE | Admit: 2022-04-09 | Discharge: 2022-04-09 | Disposition: A | Discharge status: Home / Self Care | Payer: Managed Care, Other (non HMO) | Source: Ambulatory Visit | Attending: Cardiology | Admitting: Cardiology

## 2022-04-09 DIAGNOSIS — G629 Polyneuropathy, unspecified: Secondary | ICD-10-CM

## 2022-04-09 DIAGNOSIS — I480 Paroxysmal atrial fibrillation: Secondary | ICD-10-CM | POA: Diagnosis present

## 2022-04-09 MED ORDER — IOHEXOL 350 MG/ML SOLN
95.0000 mL | Freq: Once | INTRAVENOUS | Status: AC | PRN
  Administered 2022-04-09: 95 mL via INTRAVENOUS

## 2022-04-12 ENCOUNTER — Other Ambulatory Visit (HOSPITAL_COMMUNITY): Payer: Self-pay

## 2022-04-12 NOTE — Pre-Procedure Instructions (Signed)
Attempted to call patient regarding procedure instructions for tomorrow's procedure.  Left voice mail on the following items: Arrival time 0515 Nothing to eat or drink after midnight No meds AM of procedure Responsible person to drive you home and stay with you for 24 hrs  Have you missed any doses of anti-coagulant Eliquis- if you have misses any doses please let office know right away

## 2022-04-12 NOTE — Anesthesia Preprocedure Evaluation (Addendum)
Anesthesia Evaluation  Patient identified by MRN, date of birth, ID band Patient awake    Reviewed: Allergy & Precautions, H&P , NPO status , Patient's Chart, lab work & pertinent test results  Airway Mallampati: II  TM Distance: >3 FB Neck ROM: Full    Dental no notable dental hx. (+) Teeth Intact, Dental Advisory Given   Pulmonary neg pulmonary ROS   Pulmonary exam normal breath sounds clear to auscultation       Cardiovascular Exercise Tolerance: Good hypertension, Pt. on medications and Pt. on home beta blockers + dysrhythmias Atrial Fibrillation  Rhythm:Irregular Rate:Normal     Neuro/Psych CVA, No Residual Symptoms  negative psych ROS   GI/Hepatic negative GI ROS, Neg liver ROS,,,  Endo/Other  negative endocrine ROS    Renal/GU negative Renal ROS  negative genitourinary   Musculoskeletal   Abdominal   Peds  Hematology  (+) Blood dyscrasia, anemia   Anesthesia Other Findings   Reproductive/Obstetrics negative OB ROS                             Anesthesia Physical Anesthesia Plan  ASA: 3  Anesthesia Plan: General   Post-op Pain Management: Tylenol PO (pre-op)*   Induction: Intravenous  PONV Risk Score and Plan: 3 and Ondansetron, Dexamethasone and Midazolam  Airway Management Planned: Oral ETT  Additional Equipment:   Intra-op Plan:   Post-operative Plan: Extubation in OR  Informed Consent: I have reviewed the patients History and Physical, chart, labs and discussed the procedure including the risks, benefits and alternatives for the proposed anesthesia with the patient or authorized representative who has indicated his/her understanding and acceptance.     Dental advisory given  Plan Discussed with: CRNA  Anesthesia Plan Comments:        Anesthesia Quick Evaluation

## 2022-04-13 ENCOUNTER — Other Ambulatory Visit: Payer: Self-pay

## 2022-04-13 ENCOUNTER — Encounter (HOSPITAL_COMMUNITY)
Admission: RE | Disposition: A | Discharge status: Home / Self Care | Payer: Self-pay | Source: Home / Self Care | Attending: Cardiology

## 2022-04-13 ENCOUNTER — Ambulatory Visit (HOSPITAL_COMMUNITY): Payer: Managed Care, Other (non HMO) | Admitting: Certified Registered"

## 2022-04-13 ENCOUNTER — Ambulatory Visit (HOSPITAL_COMMUNITY)
Admission: RE | Admit: 2022-04-13 | Discharge: 2022-04-13 | Disposition: A | Discharge status: Home / Self Care | Payer: Managed Care, Other (non HMO) | Attending: Cardiology | Admitting: Cardiology

## 2022-04-13 ENCOUNTER — Encounter (HOSPITAL_COMMUNITY): Payer: Self-pay | Admitting: Cardiology

## 2022-04-13 ENCOUNTER — Ambulatory Visit (HOSPITAL_BASED_OUTPATIENT_CLINIC_OR_DEPARTMENT_OTHER): Payer: Managed Care, Other (non HMO) | Admitting: Certified Registered"

## 2022-04-13 DIAGNOSIS — I10 Essential (primary) hypertension: Secondary | ICD-10-CM | POA: Diagnosis not present

## 2022-04-13 DIAGNOSIS — Z8673 Personal history of transient ischemic attack (TIA), and cerebral infarction without residual deficits: Secondary | ICD-10-CM

## 2022-04-13 DIAGNOSIS — Z7901 Long term (current) use of anticoagulants: Secondary | ICD-10-CM | POA: Diagnosis not present

## 2022-04-13 DIAGNOSIS — D649 Anemia, unspecified: Secondary | ICD-10-CM | POA: Diagnosis not present

## 2022-04-13 DIAGNOSIS — I4891 Unspecified atrial fibrillation: Secondary | ICD-10-CM

## 2022-04-13 DIAGNOSIS — E785 Hyperlipidemia, unspecified: Secondary | ICD-10-CM | POA: Diagnosis not present

## 2022-04-13 DIAGNOSIS — I480 Paroxysmal atrial fibrillation: Secondary | ICD-10-CM | POA: Diagnosis not present

## 2022-04-13 DIAGNOSIS — D6869 Other thrombophilia: Secondary | ICD-10-CM | POA: Diagnosis not present

## 2022-04-13 HISTORY — PX: ATRIAL FIBRILLATION ABLATION: EP1191

## 2022-04-13 LAB — POCT ACTIVATED CLOTTING TIME
Activated Clotting Time: 298 seconds
Activated Clotting Time: 309 seconds

## 2022-04-13 SURGERY — ATRIAL FIBRILLATION ABLATION
Anesthesia: General

## 2022-04-13 MED ORDER — ACETAMINOPHEN 500 MG PO TABS
1000.0000 mg | ORAL_TABLET | Freq: Once | ORAL | Status: AC
  Administered 2022-04-13: 1000 mg via ORAL
  Filled 2022-04-13: qty 2

## 2022-04-13 MED ORDER — DEXAMETHASONE SODIUM PHOSPHATE 10 MG/ML IJ SOLN
INTRAMUSCULAR | Status: DC | PRN
  Administered 2022-04-13: 4 mg via INTRAVENOUS

## 2022-04-13 MED ORDER — LIDOCAINE-EPINEPHRINE 1 %-1:100000 IJ SOLN
INTRAMUSCULAR | Status: AC
  Filled 2022-04-13: qty 1

## 2022-04-13 MED ORDER — MIDAZOLAM HCL 2 MG/2ML IJ SOLN
INTRAMUSCULAR | Status: DC | PRN
  Administered 2022-04-13: 2 mg via INTRAVENOUS

## 2022-04-13 MED ORDER — HEPARIN (PORCINE) IN NACL 1000-0.9 UT/500ML-% IV SOLN
INTRAVENOUS | Status: AC
  Filled 2022-04-13: qty 1500

## 2022-04-13 MED ORDER — DOBUTAMINE INFUSION FOR EP/ECHO/NUC (1000 MCG/ML)
INTRAVENOUS | Status: DC | PRN
  Administered 2022-04-13: 20 ug/kg/min via INTRAVENOUS

## 2022-04-13 MED ORDER — PROPOFOL 10 MG/ML IV BOLUS
INTRAVENOUS | Status: DC | PRN
  Administered 2022-04-13: 200 mg via INTRAVENOUS

## 2022-04-13 MED ORDER — PROTAMINE SULFATE 10 MG/ML IV SOLN
INTRAVENOUS | Status: DC | PRN
  Administered 2022-04-13: 40 mg via INTRAVENOUS

## 2022-04-13 MED ORDER — PHENYLEPHRINE HCL-NACL 20-0.9 MG/250ML-% IV SOLN
INTRAVENOUS | Status: DC | PRN
  Administered 2022-04-13: 20 ug/min via INTRAVENOUS

## 2022-04-13 MED ORDER — LIDOCAINE 2% (20 MG/ML) 5 ML SYRINGE
INTRAMUSCULAR | Status: DC | PRN
  Administered 2022-04-13: 60 mg via INTRAVENOUS

## 2022-04-13 MED ORDER — ONDANSETRON HCL 4 MG/2ML IJ SOLN: 4.0000 mg | Freq: Four times a day (QID) | INTRAMUSCULAR | Status: DC | PRN

## 2022-04-13 MED ORDER — DOBUTAMINE INFUSION FOR EP/ECHO/NUC (1000 MCG/ML)
INTRAVENOUS | Status: AC
  Filled 2022-04-13: qty 250

## 2022-04-13 MED ORDER — SODIUM CHLORIDE 0.9 % IV SOLN: INTRAVENOUS | Status: DC

## 2022-04-13 MED ORDER — ROCURONIUM BROMIDE 10 MG/ML (PF) SYRINGE
PREFILLED_SYRINGE | INTRAVENOUS | Status: DC | PRN
  Administered 2022-04-13: 60 mg via INTRAVENOUS

## 2022-04-13 MED ORDER — HEPARIN SODIUM (PORCINE) 1000 UNIT/ML IJ SOLN
INTRAMUSCULAR | Status: AC
  Filled 2022-04-13: qty 10

## 2022-04-13 MED ORDER — SODIUM CHLORIDE 0.9% FLUSH: 3.0000 mL | Freq: Two times a day (BID) | INTRAVENOUS | Status: DC

## 2022-04-13 MED ORDER — SUGAMMADEX SODIUM 200 MG/2ML IV SOLN
INTRAVENOUS | Status: DC | PRN
  Administered 2022-04-13: 200 mg via INTRAVENOUS

## 2022-04-13 MED ORDER — HEPARIN (PORCINE) IN NACL 1000-0.9 UT/500ML-% IV SOLN
INTRAVENOUS | Status: DC | PRN
  Administered 2022-04-13 (×3): 500 mL

## 2022-04-13 MED ORDER — HEPARIN SODIUM (PORCINE) 1000 UNIT/ML IJ SOLN
INTRAMUSCULAR | Status: DC | PRN
  Administered 2022-04-13: 1000 [IU] via INTRAVENOUS

## 2022-04-13 MED ORDER — PHENYLEPHRINE HCL-NACL 20-0.9 MG/250ML-% IV SOLN
INTRAVENOUS | Status: AC
  Filled 2022-04-13: qty 750

## 2022-04-13 MED ORDER — EPHEDRINE SULFATE-NACL 50-0.9 MG/10ML-% IV SOSY
PREFILLED_SYRINGE | INTRAVENOUS | Status: DC | PRN
  Administered 2022-04-13: 5 mg via INTRAVENOUS

## 2022-04-13 MED ORDER — FENTANYL CITRATE (PF) 250 MCG/5ML IJ SOLN
INTRAMUSCULAR | Status: DC | PRN
  Administered 2022-04-13: 100 ug via INTRAVENOUS

## 2022-04-13 MED ORDER — SODIUM CHLORIDE 0.9 % IV SOLN: 250.0000 mL | INTRAVENOUS | Status: DC | PRN

## 2022-04-13 MED ORDER — ONDANSETRON HCL 4 MG/2ML IJ SOLN
INTRAMUSCULAR | Status: DC | PRN
  Administered 2022-04-13: 4 mg via INTRAVENOUS

## 2022-04-13 MED ORDER — HEPARIN SODIUM (PORCINE) 1000 UNIT/ML IJ SOLN
INTRAMUSCULAR | Status: DC | PRN
  Administered 2022-04-13: 14000 [IU] via INTRAVENOUS
  Administered 2022-04-13 (×2): 3000 [IU] via INTRAVENOUS

## 2022-04-13 MED ORDER — SODIUM CHLORIDE 0.9% FLUSH: 3.0000 mL | INTRAVENOUS | Status: DC | PRN

## 2022-04-13 SURGICAL SUPPLY — 17 items
CATH ABLAT QDOT MICRO BI TC FJ (CATHETERS) ×1
CATH OCTARAY 2.0 F 3-3-3-3-3 (CATHETERS) ×1
CATH PIGTAIL STEERABLE D1 8.7 (WIRE) ×1
CATH S-M CIRCA TEMP PROBE (CATHETERS) ×1
CATH SOUNDSTAR ECO 8FR (CATHETERS) ×1
CATH WEB BI DIR CSDF CRV REPRO (CATHETERS) ×1
CLOSURE PERCLOSE PROSTYLE (VASCULAR PRODUCTS) ×4
COVER SWIFTLINK CONNECTOR (BAG) ×1
PACK EP LATEX FREE (CUSTOM PROCEDURE TRAY) ×1
PACK EP LF (CUSTOM PROCEDURE TRAY) ×1
PAD DEFIB RADIO PHYSIO CONN (PAD) ×1
SHEATH CARTO VIZIGO SM CVD (SHEATH) ×1
SHEATH PINNACLE 7F 10CM (SHEATH) ×1
SHEATH PINNACLE 8F 10CM (SHEATH) ×2
SHEATH PINNACLE 9F 10CM (SHEATH) ×1
SHEATH PROBE COVER 6X72 (BAG) ×2
TUBING SMART ABLATE COOLFLOW (TUBING) ×1

## 2022-04-13 NOTE — Anesthesia Postprocedure Evaluation (Signed)
Anesthesia Post Note  Patient: [name] [name]  Procedure(s) Performed: ATRIAL FIBRILLATION ABLATION     Patient location during evaluation: Cath Lab Anesthesia Type: General Level of consciousness: awake and alert Pain management: pain level controlled Vital Signs Assessment: post-procedure vital signs reviewed and stable Respiratory status: spontaneous breathing, nonlabored ventilation and respiratory function stable Cardiovascular status: blood pressure returned to baseline and stable Postop Assessment: no apparent nausea or vomiting Anesthetic complications: no  There were no known notable events for this encounter.  Last Vitals:  Vitals:   04/13/22 1115 04/13/22 1241  BP: 111/71   Pulse: 67 (!) 59  Resp: 13 14  Temp:    SpO2: 96% 95%    Last Pain:  Vitals:   04/13/22 1241  TempSrc:   PainSc: 4                  Genasis Zingale,W. EDMOND

## 2022-04-13 NOTE — Progress Notes (Signed)
  Left groin with new drainage, dressing removed, manual pressure held for 10 minutes with continued ooze.  Dr Curt Bears called, Lidocaine with Epi 10cc injected into sub q space around left femoral sheath sites.  Manual pressure held again. Dressing applied.

## 2022-04-13 NOTE — Anesthesia Procedure Notes (Signed)
Procedure Name: Intubation Date/Time: 04/13/2022 7:48 AM  Performed by: Anastasio Auerbach, CRNAPre-anesthesia Checklist: Patient identified, Emergency Drugs available, Suction available and Patient being monitored Patient Re-evaluated:Patient Re-evaluated prior to induction Oxygen Delivery Method: Circle system utilized Preoxygenation: Pre-oxygenation with 100% oxygen Induction Type: IV induction Ventilation: Mask ventilation without difficulty Laryngoscope Size: Mac and 3 Grade View: Grade I Tube type: Oral Tube size: 8.0 mm Number of attempts: 1 Airway Equipment and Method: Stylet and Oral airway Placement Confirmation: ETT inserted through vocal cords under direct vision, positive ETCO2 and breath sounds checked- equal and bilateral Secured at: 22 cm Tube secured with: Tape Dental Injury: Teeth and Oropharynx as per pre-operative assessment

## 2022-04-13 NOTE — Transfer of Care (Signed)
Immediate Anesthesia Transfer of Care Note  Patient: [name] [name]  Procedure(s) Performed: ATRIAL FIBRILLATION ABLATION  Patient Location: Cath Lab  Anesthesia Type:General  Level of Consciousness: awake, alert , and oriented  Airway & Oxygen Therapy: Patient Spontanous Breathing and Patient connected to nasal cannula oxygen  Post-op Assessment: Report given to RN and Post -op Vital signs reviewed and stable  Post vital signs: Reviewed and stable  Last Vitals:  Vitals Value Taken Time  BP 117/70 04/13/22 0946  Temp 36.4 C 04/13/22 0945  Pulse 63 04/13/22 0950  Resp 13 04/13/22 0950  SpO2 100 % 04/13/22 0950  Vitals shown include unvalidated device data.  Last Pain:  Vitals:   04/13/22 0945  TempSrc: Tympanic  PainSc: 0-No pain      Patients Stated Pain Goal: 4 (24/11/46 4314)  Complications: There were no known notable events for this encounter.

## 2022-04-13 NOTE — Interval H&P Note (Signed)
History and Physical Interval Note:  04/13/2022 7:06 AM  [name] [name]  has presented today for surgery, with the diagnosis of afib.  The various methods of treatment have been discussed with the patient and family. After consideration of risks, benefits and other options for treatment, the patient has consented to  Procedure(s): ATRIAL FIBRILLATION ABLATION (N/A) as a surgical intervention.  The patient's history has been reviewed, patient examined, no change in status, stable for surgery.  I have reviewed the patient's chart and labs.  Questions were answered to the patient's satisfaction.     Nieko Clarin [employer]

## 2022-04-13 NOTE — Discharge Instructions (Addendum)
Cardiac Ablation, Care After  This sheet gives you information about how to care for yourself after your procedure. Your health care provider may also give you more specific instructions. If you have problems or questions, contact your health care provider. What can I expect after the procedure? After the procedure, it is common to have: Bruising around your puncture site. Tenderness around your puncture site. Skipped heartbeats. If you had an atrial fibrillation ablation, you may have atrial fibrillation during the first several months after your procedure.  Tiredness (fatigue).  Follow these instructions at home: Puncture site care  Follow instructions from your health care provider about how to take care of your puncture site. Make sure you: If present, leave stitches (sutures), skin glue, or adhesive strips in place. These skin closures may need to stay in place for up to 2 weeks. If adhesive strip edges start to loosen and curl up, you may trim the loose edges. Do not remove adhesive strips completely unless your health care provider tells you to do that. If a large square bandage is present, this may be removed 24 hours after surgery.  Check your puncture site every day for signs of infection. Check for: Redness, swelling, or pain. Fluid or blood. If your puncture site starts to bleed, lie down on your back, apply firm pressure to the area, and contact your health care provider. Warmth. Pus or a bad smell. A pea or small marble sized lump at the site is normal and can take up to three months to resolve.  Driving Do not drive for at least 4 days after your procedure or however long your health care provider recommends. (Do not resume driving if you have previously been instructed not to drive for other health reasons.) Do not drive or use heavy machinery while taking prescription pain medicine. Activity Avoid activities that take a lot of effort for at least 7 days after your  procedure. Do not lift anything that is heavier than 5 lb (4.5 kg) for one week.  No sexual activity for 1 week.  Return to your normal activities as told by your health care provider. Ask your health care provider what activities are safe for you. General instructions Take over-the-counter and prescription medicines only as told by your health care provider. Do not use any products that contain nicotine or tobacco, such as cigarettes and e-cigarettes. If you need help quitting, ask your health care provider. You may shower after 24 hours, but Do not take baths, swim, or use a hot tub for 1 week.  Do not drink alcohol for 24 hours after your procedure. Keep all follow-up visits as told by your health care provider. This is important. Contact a health care provider if: You have redness, mild swelling, or pain around your puncture site. You have fluid or blood coming from your puncture site that stops after applying firm pressure to the area. Your puncture site feels warm to the touch. You have pus or a bad smell coming from your puncture site. You have a fever. You have chest pain or discomfort that spreads to your neck, jaw, or arm. You have chest pain that is worse with lying on your back or taking a deep breath. You are sweating a lot. You feel nauseous. You have a fast or irregular heartbeat. You have shortness of breath. You are dizzy or light-headed and feel the need to lie down. You have pain or numbness in the arm or leg closest to your puncture   site. Get help right away if: Your puncture site suddenly swells. Your puncture site is bleeding and the bleeding does not stop after applying firm pressure to the area. These symptoms may represent a serious problem that is an emergency. Do not wait to see if the symptoms will go away. Get medical help right away. Call your local emergency services (911 in the [place] Do not drive yourself to the hospital. Summary After the procedure, it  is normal to have bruising and tenderness at the puncture site in your groin, neck, or forearm. Check your puncture site every day for signs of infection. Get help right away if your puncture site is bleeding and the bleeding does not stop after applying firm pressure to the area. This is a medical emergency. This information is not intended to replace advice given to you by your health care provider. Make sure you discuss any questions you have with your health care provider.  You have an appointment set up with the At[facility] Fibrillation Clinic.  Multiple studies have shown that being followed by a dedicated atrial fibrillation clinic in addition to the standard care you receive from your other physicians improves health. We believe that enrollment in the atrial fibrillation clinic will allow us to better care for you.   The phone number to the At[facility]ibrillation Clinic is [phone]. The clinic is staffed Monday through Friday from 8:30am to 5pm.  Directions: The clinic is located in the [facility], 6TH FLOOR Enter the hospital at the MAIN ENTRANCE "A", use North Tower Elevators to the 6th floor.  Registration desk to the right of elevators on 6th floor  If you have any trouble locating the clinic, please don't hesitate to call [phone].    

## 2022-04-17 ENCOUNTER — Other Ambulatory Visit (HOSPITAL_COMMUNITY): Payer: Self-pay

## 2022-04-20 ENCOUNTER — Other Ambulatory Visit (HOSPITAL_COMMUNITY): Payer: Self-pay

## 2022-04-20 ENCOUNTER — Other Ambulatory Visit: Payer: Self-pay

## 2022-04-24 ENCOUNTER — Other Ambulatory Visit (HOSPITAL_COMMUNITY): Payer: Self-pay

## 2022-04-24 ENCOUNTER — Other Ambulatory Visit: Payer: Self-pay

## 2022-05-11 ENCOUNTER — Ambulatory Visit (HOSPITAL_COMMUNITY)
Admission: RE | Admit: 2022-05-11 | Discharge: 2022-05-11 | Disposition: A | Discharge status: Home / Self Care | Payer: Managed Care, Other (non HMO) | Source: Ambulatory Visit | Attending: Physician Assistant | Admitting: Physician Assistant

## 2022-05-11 VITALS — BP 118/64 | HR 54 | Ht 71.0 in | Wt 210.0 lb

## 2022-05-11 DIAGNOSIS — Z79899 Other long term (current) drug therapy: Secondary | ICD-10-CM | POA: Insufficient documentation

## 2022-05-11 DIAGNOSIS — I10 Essential (primary) hypertension: Secondary | ICD-10-CM | POA: Diagnosis not present

## 2022-05-11 DIAGNOSIS — E785 Hyperlipidemia, unspecified: Secondary | ICD-10-CM | POA: Insufficient documentation

## 2022-05-11 DIAGNOSIS — Z8673 Personal history of transient ischemic attack (TIA), and cerebral infarction without residual deficits: Secondary | ICD-10-CM | POA: Insufficient documentation

## 2022-05-11 DIAGNOSIS — D6869 Other thrombophilia: Secondary | ICD-10-CM

## 2022-05-11 DIAGNOSIS — Z7901 Long term (current) use of anticoagulants: Secondary | ICD-10-CM | POA: Diagnosis not present

## 2022-05-11 DIAGNOSIS — I480 Paroxysmal atrial fibrillation: Secondary | ICD-10-CM

## 2022-05-11 DIAGNOSIS — I2510 Atherosclerotic heart disease of native coronary artery without angina pectoris: Secondary | ICD-10-CM | POA: Diagnosis not present

## 2022-05-11 NOTE — Progress Notes (Signed)
Primary Care Physician: Donnajean Lopes, MD Primary Cardiologist: Dr Stanford Breed  Primary Electrophysiologist: Dr Curt Bears Referring Physician: Dr Sherin Quarry Deshmukh is a 67 y.o. male with a history of HTN, HLD, CVA, CAD, HLD, atrial fibrillation who presents for follow up in the [facility]. He presented to the hospital 10/17/2021 with rapid atrial fibrillation. He had sudden onset of brief cramping in his left upper chest. He developed palpitations and started feeling poorly. He converted to sinus rhythm while in the hospital without need for intervention. Patient is on Eliquis for a CHADS2VASC score of 3. He was seen by Dr Curt Bears and underwent afib ablation 04/13/22.  On follow up today, patient reports that he has done well since the ablation. He had 3 days where his heart was beating more forcefully, not irregular, but his resolved. He denies chest pain, swallowing pain, or groin issues.   Today, he denies symptoms of palpitations, chest pain, shortness of breath, orthopnea, PND, lower extremity edema, dizziness, presyncope, syncope, snoring, daytime somnolence, bleeding, or neurologic sequela. The patient is tolerating medications without difficulties and is otherwise without complaint today.    Atrial Fibrillation Risk Factors:  he does not have symptoms or diagnosis of sleep apnea. he does not have a history of rheumatic fever.   he has a BMI of Body mass index is 29.29 kg/m.Marland Kitchen Filed Weights   05/11/22 0848  Weight: 95.3 kg    Family History  Problem Relation Age of Onset   Aortic aneurysm Father    Heart attack Brother    Cerebral palsy Brother    Colon cancer Neg Hx    Esophageal cancer Neg Hx    Pancreatic cancer Neg Hx    Prostate cancer Neg Hx    Rectal cancer Neg Hx    Stomach cancer Neg Hx      Atrial Fibrillation Management history:  Previous antiarrhythmic drugs: none Previous cardioversions: none Previous ablations:  04/13/22 CHADS2VASC score: 3 Anticoagulation history: Eliquis   Past Medical History:  Diagnosis Date   Diverticulosis of colon (without mention of hemorrhage)    Hemorrhoids    Hypertension    IBS (irritable bowel syndrome)    Iron deficiency anemia due to chronic blood loss 09/26/2017   Irregular heartbeat    Nephrolithiasis    Other and unspecified hyperlipidemia    Perirectal fistula    Polycythemia, secondary    Stroke (HCC)    Stye    blocker duct right eye lid   Past Surgical History:  Procedure Laterality Date   ATRIAL FIBRILLATION ABLATION N/A 04/13/2022   Procedure: ATRIAL FIBRILLATION ABLATION;  Surgeon: Constance Haw, MD;  Location: [facility];  Service: Cardiovascular;  Laterality: N/A;   RETINAL DETACHMENT SURGERY     ROTATOR CUFF REPAIR     rt.   VASECTOMY      Current Outpatient Medications  Medication Sig Dispense Refill   acetaminophen (TYLENOL) 500 MG tablet Take 1,000 mg by mouth every 6 (six) hours as needed for mild pain.      amitriptyline (ELAVIL) 25 MG tablet TAKE 1 TABLET BY MOUTH EVERYDAY AT BEDTIME 90 tablet 1   apixaban (ELIQUIS) 5 MG TABS tablet Take 1 tablet (5 mg total) by mouth 2 (two) times daily. 180 tablet 3   aspirin EC 81 MG EC tablet Take 1 tablet (81 mg total) by mouth daily. (Patient taking differently: Take 81 mg by mouth at bedtime.) 60 tablet 0  buPROPion (WELLBUTRIN SR) 150 MG 12 hr tablet Take 150 mg by mouth in the morning.     diltiazem (CARDIZEM CD) 120 MG 24 hr capsule Take 1 capsule (120 mg total) by mouth daily. 90 capsule 3   fluorouracil (EFUDEX) 5 % cream Apply 1 application  topically 2 (two) times daily as needed (dry skin).     gabapentin (NEURONTIN) 300 MG capsule Take 2 capsules (600 mg total) by mouth 2 (two) times daily. 360 capsule 1   losartan (COZAAR) 100 MG tablet Take 1 tablet (100 mg total) by mouth daily. (Patient taking differently: Take 100 mg by mouth every evening.) 90 tablet 3   Melatonin  10 MG TABS Take 10 mg by mouth at bedtime.     Multiple Vitamin (MULTIVITAMIN WITH MINERALS) TABS tablet Take 1 tablet by mouth in the morning.     Polyethyl Glycol-Propyl Glycol (LUBRICANT EYE DROPS) 0.4-0.3 % SOLN Place 1 drop into both eyes 3 (three) times daily as needed (dry/irritated eyes.).     propranolol ER (INDERAL LA) 160 MG SR capsule Take 1 capsule (160 mg total) by mouth daily. (Patient taking differently: Take 160 mg by mouth every evening.) 90 capsule 3   rosuvastatin (CRESTOR) 20 MG tablet Take 1 tablet (20 mg total) by mouth daily. 90 tablet 3   ruxolitinib phosphate (JAKAFI) 10 MG tablet TAKE 1 TABLET BY MOUTH 2 TIMES DAILY. 60 tablet 4   sodium chloride (OCEAN) 0.65 % SOLN nasal spray Place 1 spray into both nostrils as needed for congestion.     tadalafil (CIALIS) 5 MG tablet Take 5 mg by mouth every other day. In the morning.     zolpidem (AMBIEN CR) 12.5 MG CR tablet Take 6.25 mg by mouth at bedtime as needed for sleep.     No current facility-administered medications for this encounter.    Allergies  Allergen Reactions   Trazodone Hcl Other (See Comments)    Other reaction(s): too sleepy in AM   Niacin Itching and Other (See Comments)    Flushed feeling   Remeron [Mirtazapine] Other (See Comments)    Other reaction(s): too sleepy in the AM    Social History   Socioeconomic History   Marital status: Married    Spouse name: Mardene Celeste   Number of children: 2   Years of education: Not on file   Highest education level: Not on file  Occupational History   Occupation: Lobbyist: [employer]  Tobacco Use   Smoking status: Never   Smokeless tobacco: Never  Vaping Use   Vaping Use: Never used  Substance and Sexual Activity   Alcohol use: Yes    Alcohol/week: 0.0 standard drinks of alcohol    Comment: Occasional   Drug use: No   Sexual activity: Yes  Other Topics Concern   Not on file  Social History Narrative   Lives with wife   Social  Determinants of Health   Financial Resource Strain: Not on file  Food Insecurity: Not on file  Transportation Needs: Not on file  Physical Activity: Not on file  Stress: Not on file  Social Connections: Not on file  Intimate Partner Violence: Not on file     ROS- All systems are reviewed and negative except as per the HPI above.  Physical Exam: Vitals:   05/11/22 0848  BP: 118/64  Pulse: (!) 54  Weight: 95.3 kg  Height: '5\' 11"'$  (1.803 m)    GEN- The patient  is a well appearing male, alert and oriented x 3 today.   Head- normocephalic, atraumatic Eyes-  Sclera clear, conjunctiva pink Ears- hearing intact Oropharynx- clear Neck- supple  Lungs- Clear to ausculation bilaterally, normal work of breathing Heart- Regular rate and rhythm, no murmurs, rubs or gallops  GI- soft, NT, ND, + BS Extremities- no clubbing, cyanosis, or edema MS- no significant deformity or atrophy Skin- no rash or lesion Psych- euthymic mood, full affect Neuro- strength and sensation are intact  Wt Readings from Last 3 Encounters:  05/11/22 95.3 kg  04/13/22 90.7 kg  04/03/22 92.9 kg    EKG today demonstrates  SB, LAFB Vent. rate 54 BPM PR interval 178 ms QRS duration 110 ms QT/QTcB 442/419 ms  Echo 09/07/21 demonstrated   1. Left ventricular ejection fraction, by estimation, is 55 to 60%. Left  ventricular ejection fraction by 3D volume is 58 %. The left ventricle has  normal function. The left ventricle has no regional wall motion  abnormalities. Left ventricular diastolic parameters are consistent with Grade I diastolic dysfunction (impaired relaxation). The average left ventricular global longitudinal strain is -21.3 %. The global longitudinal strain is normal.   2. Right ventricular systolic function is normal. The right ventricular  size is mildly enlarged. Tricuspid regurgitation signal is inadequate for  assessing PA pressure.   3. The mitral valve is normal in structure. Trivial  mitral valve  regurgitation. No evidence of mitral stenosis.   4. The aortic valve is normal in structure. Aortic valve regurgitation is  not visualized. No aortic stenosis is present.   5. Aortic dilatation noted. There is mild dilatation of the ascending  aorta, measuring 39 mm.   Comparison(s): 09/13/18 EF 55-60%.   Epic records are reviewed at length today  CHA2DS2-VASc Score = 3  The patient's score is based upon: CHF History: 0 HTN History: 1 Diabetes History: 0 Stroke History: 2 Vascular Disease History: 0 (very low CAC score) Age Score: 0 Gender Score: 0       ASSESSMENT AND PLAN: 1. Paroxysmal Atrial Fibrillation (ICD10:  I48.0) The patient's CHA2DS2-VASc score is 3, indicating a 3.2% annual risk of stroke.   S/p afib ablation 04/13/22 Patient appears to be maintaining SR. Continue Eliquis 5 mg BID with no missed doses for 3 months post ablation.  Continue diltiazem 120 mg daily Continue propranolol 160 mg daily  2. Secondary Hypercoagulable State (ICD10:  D68.69) The patient is at significant risk for stroke/thromboembolism based upon his CHA2DS2-VASc Score of 3.  Continue Apixaban (Eliquis).   3. HTN Stable, no changes today.   Follow up with Dr Curt Bears as scheduled.    [facility] [address] [place], [place] [address] [phone] 05/11/2022 1:20 PM

## 2022-05-15 ENCOUNTER — Encounter: Payer: Self-pay | Admitting: Hematology & Oncology

## 2022-05-15 ENCOUNTER — Inpatient Hospital Stay: Payer: Managed Care, Other (non HMO) | Attending: Hematology & Oncology

## 2022-05-15 ENCOUNTER — Inpatient Hospital Stay: Payer: Managed Care, Other (non HMO) | Admitting: Hematology & Oncology

## 2022-05-15 ENCOUNTER — Other Ambulatory Visit: Payer: Self-pay

## 2022-05-15 VITALS — BP 115/65 | HR 56 | Temp 98.0°F | Resp 18 | Ht 71.0 in | Wt 207.0 lb

## 2022-05-15 DIAGNOSIS — E611 Iron deficiency: Secondary | ICD-10-CM | POA: Diagnosis not present

## 2022-05-15 DIAGNOSIS — D751 Secondary polycythemia: Secondary | ICD-10-CM

## 2022-05-15 DIAGNOSIS — Z7901 Long term (current) use of anticoagulants: Secondary | ICD-10-CM | POA: Diagnosis not present

## 2022-05-15 DIAGNOSIS — D45 Polycythemia vera: Secondary | ICD-10-CM | POA: Diagnosis not present

## 2022-05-15 DIAGNOSIS — D500 Iron deficiency anemia secondary to blood loss (chronic): Secondary | ICD-10-CM

## 2022-05-15 LAB — CMP (CANCER CENTER ONLY)
ALT: 22 U/L (ref 0–44)
AST: 26 U/L (ref 15–41)
Albumin: 4.7 g/dL (ref 3.5–5.0)
Alkaline Phosphatase: 51 U/L (ref 38–126)
Anion gap: 7 (ref 5–15)
BUN: 20 mg/dL (ref 8–23)
CO2: 30 mmol/L (ref 22–32)
Calcium: 9 mg/dL (ref 8.9–10.3)
Chloride: 105 mmol/L (ref 98–111)
Creatinine: 1.13 mg/dL (ref 0.61–1.24)
GFR, Estimated: >60 mL/min (ref >60)
Glucose, Bld: 93 mg/dL (ref 70–99)
Potassium: 4.4 mmol/L (ref 3.5–5.1)
Sodium: 142 mmol/L (ref 135–145)
Total Bilirubin: 0.9 mg/dL (ref 0.3–1.2)
Total Protein: 6.8 g/dL (ref 6.5–8.1)

## 2022-05-15 LAB — RETICULOCYTES
Immature Retic Fract: 21.8 % — ABNORMAL HIGH (ref 2.3–15.9)
RBC.: 4.32 MIL/uL (ref 4.22–5.81)
Retic Count, Absolute: 140.8 10*3/uL (ref 19.0–186.0)
Retic Ct Pct: 3.3 % — ABNORMAL HIGH (ref 0.4–3.1)

## 2022-05-15 LAB — CBC WITH DIFFERENTIAL (CANCER CENTER ONLY)
Abs Immature Granulocytes: 0.24 10*3/uL — ABNORMAL HIGH (ref 0.00–0.07)
Basophils Absolute: 0.1 10*3/uL (ref 0.0–0.1)
Basophils Relative: 1 %
Eosinophils Absolute: 0.3 10*3/uL (ref 0.0–0.5)
Eosinophils Relative: 4 %
HCT: 36.7 % — ABNORMAL LOW (ref 39.0–52.0)
Hemoglobin: 11.8 g/dL — ABNORMAL LOW (ref 13.0–17.0)
Immature Granulocytes: 3 %
Lymphocytes Relative: 11 %
Lymphs Abs: 0.9 10*3/uL (ref 0.7–4.0)
MCH: 27.9 pg (ref 26.0–34.0)
MCHC: 32.2 g/dL (ref 30.0–36.0)
MCV: 86.8 fL (ref 80.0–100.0)
Monocytes Absolute: 0.8 10*3/uL (ref 0.1–1.0)
Monocytes Relative: 10 %
Neutro Abs: 5.6 10*3/uL (ref 1.7–7.7)
Neutrophils Relative %: 71 %
Platelet Count: 377 10*3/uL (ref 150–400)
RBC: 4.23 MIL/uL (ref 4.22–5.81)
RDW: 18.9 % — ABNORMAL HIGH (ref 11.5–15.5)
WBC Count: 7.9 10*3/uL (ref 4.0–10.5)
nRBC: 0.5 % — ABNORMAL HIGH (ref 0.0–0.2)

## 2022-05-15 LAB — LACTATE DEHYDROGENASE: LDH: 454 U/L — ABNORMAL HIGH (ref 98–192)

## 2022-05-15 LAB — IRON AND IRON BINDING CAPACITY (CC-WL,HP ONLY)
Iron: 137 ug/dL (ref 45–182)
Saturation Ratios: 35 % (ref 17.9–39.5)
TIBC: 393 ug/dL (ref 250–450)
UIBC: 256 ug/dL (ref 117–376)

## 2022-05-15 LAB — FERRITIN: Ferritin: 68 ng/mL (ref 24–336)

## 2022-05-15 LAB — SAVE SMEAR(SSMR), FOR PROVIDER SLIDE REVIEW

## 2022-05-15 NOTE — Progress Notes (Signed)
Hematology and Oncology Follow Up Visit  [name] [name] [id number] 11/22/1955 67 y.o. 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  Had a pretty nice Holiday season.  His kids did come in for a little bit.  They had to go back to their home and other family members for Christmas.  He did undergo cardiac ablation.  This went quite well.  From my exam today, he is in sinus rhythm.  He has had no problems with bleeding.  He has had no cough or shortness of breath.  He is on both Eliquis and aspirin.  He is doing well with these.  He has had no change in bowel or bladder habits.  He has had no rashes.  He has had no nausea or vomiting.  There is been no leg swelling.  Has had no headache.  Overall, I would say his performance status is probably ECOG 0.    Medications:  Allergies as of 05/15/2022       Reactions   Trazodone Hcl Other (See Comments)   Other reaction(s): too sleepy in AM   Niacin Itching, Other (See Comments)   Flushed feeling   Remeron [mirtazapine] Other (See Comments)   Other reaction(s): too sleepy in the AM        Medication List        Accurate as of May 15, 2022  8:47 AM. If you have any questions, ask your nurse or doctor.          acetaminophen 500 MG tablet Commonly known as: TYLENOL Take 1,000 mg by mouth every 6 (six) hours as needed for mild pain.   amitriptyline 25 MG tablet Commonly known as: ELAVIL TAKE 1 TABLET BY MOUTH EVERYDAY AT BEDTIME   aspirin EC 81 MG  tablet Take 1 tablet (81 mg total) by mouth daily. What changed: when to take this   buPROPion 150 MG 12 hr tablet Commonly known as: WELLBUTRIN SR Take 150 mg by mouth in the morning.   diltiazem 120 MG 24 hr capsule Commonly known as: CARDIZEM CD Take 1 capsule (120 mg total) by mouth daily.   Eliquis 5 MG Tabs tablet Generic drug: apixaban Take 1 tablet (5 mg total) by mouth 2 (two) times daily.   fluorouracil 5 % cream Commonly known as: EFUDEX Apply 1 application  topically 2 (two) times daily as needed (dry skin).   gabapentin 300 MG capsule Commonly known as: NEURONTIN Take 2 capsules (600 mg total) by mouth 2 (two) times daily.   Jakafi 10 MG tablet Generic drug: ruxolitinib phosphate TAKE 1 TABLET BY MOUTH 2 TIMES DAILY.   losartan 100 MG tablet Commonly known as: COZAAR Take 1 tablet (100 mg total) by mouth daily. What changed: when to take this   Lubricant Eye Drops 0.4-0.3 % Soln Generic drug: Polyethyl Glycol-Propyl Glycol Place 1 drop into both eyes 3 (three) times daily as needed (dry/irritated eyes.).   Melatonin 10 MG Tabs Take 10 mg by mouth at bedtime.  multivitamin with minerals Tabs tablet Take 1 tablet by mouth in the morning.   propranolol ER 160 MG SR capsule Commonly known as: INDERAL LA Take 1 capsule (160 mg total) by mouth daily. What changed: when to take this   rosuvastatin 20 MG tablet Commonly known as: CRESTOR Take 1 tablet (20 mg total) by mouth daily.   sodium chloride 0.65 % Soln nasal spray Commonly known as: OCEAN Place 1 spray into both nostrils as needed for congestion.   tadalafil 5 MG tablet Commonly known as: CIALIS Take 5 mg by mouth every other day. In the morning.   zolpidem 12.5 MG CR tablet Commonly known as: AMBIEN CR Take 6.25 mg by mouth at bedtime as needed for sleep.        Allergies:  Allergies  Allergen Reactions   Trazodone Hcl Other (See Comments)    Other reaction(s): too sleepy in AM    Niacin Itching and Other (See Comments)    Flushed feeling   Remeron [Mirtazapine] Other (See Comments)    Other reaction(s): too sleepy in the AM     Past Medical History, Surgical history, Social history, and Family History were reviewed and updated.  Review of Systems: Review of Systems  Constitutional: Negative.   HENT: Negative.    Eyes: Negative.   Respiratory: Negative.    Cardiovascular: Negative.   Gastrointestinal: Negative.   Genitourinary: Negative.   Musculoskeletal: Negative.   Skin:  Positive for rash.  Neurological: Negative.   Endo/Heme/Allergies: Negative.   Psychiatric/Behavioral: Negative.       Physical Exam:  height is '5\' 11"'$  (1.803 m) and weight is 207 lb (93.9 kg). His oral temperature is 98 F (36.7 C). His blood pressure is 115/65 and his pulse is 56 (abnormal). His respiration is 18 and oxygen saturation is 100%.   Wt Readings from Last 3 Encounters:  05/15/22 207 lb (93.9 kg)  05/11/22 210 lb (95.3 kg)  04/13/22 200 lb (90.7 kg)    Physical Exam Vitals reviewed.  HENT:     Head: Normocephalic and atraumatic.  Eyes:     Pupils: Pupils are equal, round, and reactive to light.  Cardiovascular:     Rate and Rhythm: Normal rate and regular rhythm.     Heart sounds: Normal heart sounds.  Pulmonary:     Effort: Pulmonary effort is normal.     Breath sounds: Normal breath sounds.  Abdominal:     General: Bowel sounds are normal.     Palpations: Abdomen is soft.     Comments: I cannot palpate his spleen.  Musculoskeletal:        General: No tenderness or deformity. Normal range of motion.     Cervical back: Normal range of motion.  Lymphadenopathy:     Cervical: No cervical adenopathy.  Skin:    General: Skin is warm and dry.     Findings: No erythema or rash.     Comments: He has the healing zoster rash in the left T7 dermatome.  There is some sensitivity to touch in this area.  Neurological:     Mental Status: He is alert and oriented  to person, place, and time.  Psychiatric:        Behavior: Behavior normal.        Thought Content: Thought content normal.        Judgment: Judgment normal.     Lab Results  Component Value Date   WBC 7.9 05/15/2022   HGB 11.8 (L)  05/15/2022   HCT 36.7 (L) 05/15/2022   MCV 86.8 05/15/2022   PLT 377 05/15/2022   Lab Results  Component Value Date   FERRITIN 57 04/03/2022   IRON 114 04/03/2022   TIBC 402 04/03/2022   UIBC 288 04/03/2022   IRONPCTSAT 28 04/03/2022   Lab Results  Component Value Date   RETICCTPCT 3.3 (H) 05/15/2022   RBC 4.23 05/15/2022   RBC 4.32 05/15/2022   RETICCTABS 87.6 02/21/2011   Lab Results  Component Value Date   KPAFRELGTCHN 10.7 09/27/2021   LAMBDASER 8.5 09/27/2021   KAPLAMBRATIO 1.26 09/27/2021   No results found for: "IGGSERUM", "IGA", "IGMSERUM" No results found for: "TOTALPROTELP", "ALBUMINELP", "A1GS", "A2GS", "BETS", "BETA2SER", "GAMS", "MSPIKE", "SPEI"   Chemistry      Component Value Date/Time   NA 142 04/03/2022 0821   NA 144 04/25/2017 1506   NA 139 04/12/2016 1113   K 4.1 04/03/2022 0821   K 4.2 04/25/2017 1506   K 4.1 04/12/2016 1113   CL 105 04/03/2022 0821   CL 105 04/25/2017 1506   CO2 30 04/03/2022 0821   CO2 29 04/25/2017 1506   CO2 22 04/12/2016 1113   BUN 18 04/03/2022 0821   BUN 17 04/25/2017 1506   BUN 16.3 04/12/2016 1113   CREATININE 1.12 04/03/2022 0821   CREATININE 1.2 04/25/2017 1506   CREATININE 0.9 04/12/2016 1113      Component Value Date/Time   CALCIUM 9.5 04/03/2022 0821   CALCIUM 9.5 04/25/2017 1506   CALCIUM 9.1 04/12/2016 1113   ALKPHOS 43 04/03/2022 0821   ALKPHOS 73 04/25/2017 1506   ALKPHOS 66 04/12/2016 1113   AST 29 04/03/2022 0821   AST 31 04/12/2016 1113   ALT 23 04/03/2022 0821   ALT 58 (H) 04/25/2017 1506   ALT 36 04/12/2016 1113   BILITOT 0.7 04/03/2022 0821   BILITOT 0.73 04/12/2016 1113       Impression and Plan: [name] [name] is a very pleasant 67 yo caucasian  gentleman with polycythemia vera, JAK2 positive.   His platelet count is doing great.  His hemoglobin is holding steady.  I would like to get another ultrasound of his spleen whenever I see him back.  We will plan to get him back in about 6 weeks.  He is planning on going to [place] in late April.  We will have to coordinate our appointments for that trip.  I hope that this will be a good year for him.  Hopefully there will be no problems with his heart.  He is on blood thinner.   [name], MD 1/9/20248:47 AM

## 2022-05-17 ENCOUNTER — Other Ambulatory Visit (HOSPITAL_COMMUNITY): Payer: Self-pay

## 2022-05-29 ENCOUNTER — Other Ambulatory Visit: Payer: Self-pay

## 2022-05-30 ENCOUNTER — Encounter: Payer: Self-pay | Admitting: Family

## 2022-05-30 ENCOUNTER — Other Ambulatory Visit (HOSPITAL_COMMUNITY): Payer: Self-pay

## 2022-05-30 ENCOUNTER — Telehealth: Payer: Self-pay

## 2022-05-30 NOTE — Telephone Encounter (Signed)
Received phone call from patient with concerns over his prescription for [facility]. Pt stated he received phone calls from [facility] and was unsure if it was a scam. This RN reached out to [facility] who stated that there were technical issues with the phones this AM (he had tried to call patient and finish registration on a conference call) and patient needed to call [phone] and finish his registration with this company so he can continue to get his prescription mailed to him from the [facility]. Pt aware of need to call  for him to continue to receive medication. Pt verbalized understanding and had no further questions.

## 2022-06-01 ENCOUNTER — Other Ambulatory Visit (HOSPITAL_COMMUNITY): Payer: Self-pay

## 2022-06-04 ENCOUNTER — Encounter: Payer: Self-pay | Admitting: Family

## 2022-06-04 ENCOUNTER — Other Ambulatory Visit (HOSPITAL_COMMUNITY): Payer: Self-pay

## 2022-06-05 ENCOUNTER — Other Ambulatory Visit (HOSPITAL_COMMUNITY): Payer: Self-pay

## 2022-06-19 ENCOUNTER — Other Ambulatory Visit (HOSPITAL_COMMUNITY): Payer: Self-pay

## 2022-06-25 ENCOUNTER — Ambulatory Visit (HOSPITAL_BASED_OUTPATIENT_CLINIC_OR_DEPARTMENT_OTHER)
Admission: RE | Admit: 2022-06-25 | Discharge: 2022-06-25 | Disposition: A | Discharge status: Home / Self Care | Payer: Managed Care, Other (non HMO) | Source: Ambulatory Visit | Attending: Hematology & Oncology | Admitting: Hematology & Oncology

## 2022-06-25 DIAGNOSIS — D751 Secondary polycythemia: Secondary | ICD-10-CM | POA: Diagnosis not present

## 2022-06-26 ENCOUNTER — Other Ambulatory Visit: Payer: Self-pay

## 2022-06-26 ENCOUNTER — Ambulatory Visit (HOSPITAL_BASED_OUTPATIENT_CLINIC_OR_DEPARTMENT_OTHER): Payer: Managed Care, Other (non HMO)

## 2022-06-26 ENCOUNTER — Other Ambulatory Visit (HOSPITAL_BASED_OUTPATIENT_CLINIC_OR_DEPARTMENT_OTHER): Payer: Managed Care, Other (non HMO)

## 2022-06-26 ENCOUNTER — Inpatient Hospital Stay: Payer: Managed Care, Other (non HMO) | Admitting: Medical Oncology

## 2022-06-26 ENCOUNTER — Inpatient Hospital Stay: Payer: Managed Care, Other (non HMO) | Attending: Hematology & Oncology

## 2022-06-26 VITALS — BP 129/82 | HR 67 | Temp 97.9°F | Resp 16 | Ht 71.0 in | Wt 211.1 lb

## 2022-06-26 DIAGNOSIS — D45 Polycythemia vera: Secondary | ICD-10-CM | POA: Insufficient documentation

## 2022-06-26 DIAGNOSIS — D500 Iron deficiency anemia secondary to blood loss (chronic): Secondary | ICD-10-CM | POA: Diagnosis not present

## 2022-06-26 DIAGNOSIS — Z7901 Long term (current) use of anticoagulants: Secondary | ICD-10-CM | POA: Insufficient documentation

## 2022-06-26 DIAGNOSIS — E611 Iron deficiency: Secondary | ICD-10-CM | POA: Diagnosis not present

## 2022-06-26 DIAGNOSIS — D751 Secondary polycythemia: Secondary | ICD-10-CM

## 2022-06-26 LAB — CMP (CANCER CENTER ONLY)
ALT: 26 U/L (ref 0–44)
AST: 30 U/L (ref 15–41)
Albumin: 4.1 g/dL (ref 3.5–5.0)
Alkaline Phosphatase: 51 U/L (ref 38–126)
Anion gap: 7 (ref 5–15)
BUN: 14 mg/dL (ref 8–23)
CO2: 25 mmol/L (ref 22–32)
Calcium: 8.6 mg/dL — ABNORMAL LOW (ref 8.9–10.3)
Chloride: 106 mmol/L (ref 98–111)
Creatinine: 0.94 mg/dL (ref 0.61–1.24)
GFR, Estimated: >60 mL/min (ref >60)
Glucose, Bld: 112 mg/dL — ABNORMAL HIGH (ref 70–99)
Potassium: 3.5 mmol/L (ref 3.5–5.1)
Sodium: 138 mmol/L (ref 135–145)
Total Bilirubin: 1 mg/dL (ref 0.3–1.2)
Total Protein: 6.5 g/dL (ref 6.5–8.1)

## 2022-06-26 LAB — IRON AND IRON BINDING CAPACITY (CC-WL,HP ONLY)
Iron: 159 ug/dL (ref 45–182)
Saturation Ratios: 42 % — ABNORMAL HIGH (ref 17.9–39.5)
TIBC: 379 ug/dL (ref 250–450)
UIBC: 220 ug/dL (ref 117–376)

## 2022-06-26 LAB — CBC WITH DIFFERENTIAL (CANCER CENTER ONLY)
Abs Immature Granulocytes: 0.2 10*3/uL — ABNORMAL HIGH (ref 0.00–0.07)
Basophils Absolute: 0.1 10*3/uL (ref 0.0–0.1)
Basophils Relative: 2 %
Eosinophils Absolute: 0.3 10*3/uL (ref 0.0–0.5)
Eosinophils Relative: 4 %
HCT: 35.1 % — ABNORMAL LOW (ref 39.0–52.0)
Hemoglobin: 11.7 g/dL — ABNORMAL LOW (ref 13.0–17.0)
Immature Granulocytes: 3 %
Lymphocytes Relative: 11 %
Lymphs Abs: 0.8 10*3/uL (ref 0.7–4.0)
MCH: 28.1 pg (ref 26.0–34.0)
MCHC: 33.3 g/dL (ref 30.0–36.0)
MCV: 84.4 fL (ref 80.0–100.0)
Monocytes Absolute: 0.6 10*3/uL (ref 0.1–1.0)
Monocytes Relative: 9 %
Neutro Abs: 5.1 10*3/uL (ref 1.7–7.7)
Neutrophils Relative %: 71 %
Platelet Count: 346 10*3/uL (ref 150–400)
RBC: 4.16 MIL/uL — ABNORMAL LOW (ref 4.22–5.81)
RDW: 18.6 % — ABNORMAL HIGH (ref 11.5–15.5)
WBC Count: 7.1 10*3/uL (ref 4.0–10.5)
nRBC: 0.6 % — ABNORMAL HIGH (ref 0.0–0.2)

## 2022-06-26 LAB — SAVE SMEAR(SSMR), FOR PROVIDER SLIDE REVIEW

## 2022-06-26 LAB — FERRITIN: Ferritin: 68 ng/mL (ref 24–336)

## 2022-06-26 LAB — RETICULOCYTES
Immature Retic Fract: 22.8 % — ABNORMAL HIGH (ref 2.3–15.9)
RBC.: 4.23 MIL/uL (ref 4.22–5.81)
Retic Count, Absolute: 113.8 10*3/uL (ref 19.0–186.0)
Retic Ct Pct: 2.7 % (ref 0.4–3.1)

## 2022-06-26 LAB — LACTATE DEHYDROGENASE: LDH: 428 U/L — ABNORMAL HIGH (ref 98–192)

## 2022-06-26 NOTE — Progress Notes (Signed)
Hematology and Oncology Follow Up Visit  [name] [name] [id number] [birth year] 67 y.o. 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    He reports that he is doing well. Still in normal sinus rhythm following his cardiac ablation. He is happy about this.   Since his last visit he has had no problems with bleeding.  Has some bruising but non without injury and no significant bruises on body. He has had no cough or shortness of breath.  He is on both Eliquis and aspirin.  He is doing well with these.  He has had no change in bowel or bladder habits.  He has had no rashes.  He has had no nausea or vomiting.  There is been no leg swelling.  Has had no headache.  Overall, I would say his performance status is probably ECOG 0.   Wt Readings from Last 3 Encounters:  06/26/22 211 lb 1.9 oz (95.8 kg)  05/15/22 207 lb (93.9 kg)  05/11/22 210 lb (95.3 kg)    Medications:  Allergies as of 06/26/2022       Reactions   Trazodone Hcl Other (See Comments)   Other reaction(s): too sleepy in AM   Niacin Itching, Other (See Comments)   Flushed feeling   Remeron [mirtazapine] Other (See Comments)   Other reaction(s): too sleepy in the AM        Medication List        Accurate as of June 26, 2022  1:07 PM. If you have any questions, ask your nurse or doctor.          STOP taking these medications    Lubricant Eye Drops 0.4-0.3 % Soln Generic drug: Polyethyl Glycol-Propyl Glycol Stopped by: Hughie Closs,  PA-C   sodium chloride 0.65 % Soln nasal spray Commonly known as: OCEAN Stopped by: Hughie Closs, PA-C       TAKE these medications    acetaminophen 500 MG tablet Commonly known as: TYLENOL Take 1,000 mg by mouth every 6 (six) hours as needed for mild pain.   amitriptyline 25 MG tablet Commonly known as: ELAVIL TAKE 1 TABLET BY MOUTH EVERYDAY AT BEDTIME   aspirin EC 81 MG tablet Take 1 tablet (81 mg total) by mouth daily. What changed: when to take this   buPROPion 150 MG 12 hr tablet Commonly known as: WELLBUTRIN SR Take 150 mg by mouth in the morning.   diltiazem 120 MG 24 hr capsule Commonly known as: CARDIZEM CD Take 1 capsule (120 mg total) by mouth daily.   Eliquis 5 MG Tabs tablet Generic drug: apixaban Take 1 tablet (5 mg total) by mouth 2 (two) times daily.   fluorouracil 5 % cream Commonly known as: EFUDEX Apply 1 application  topically 2 (two) times daily as needed (dry skin).   gabapentin 300 MG capsule Commonly known as: NEURONTIN Take 2 capsules (600 mg total) by mouth 2 (two) times daily.   hydrOXYzine 25 MG capsule Commonly known as: VISTARIL 1 capsule  1 hour before bedtime as needed Orally Once a day for 90 days As needed for sleep   Jakafi 10 MG tablet Generic drug: ruxolitinib phosphate TAKE 1 TABLET BY MOUTH 2 TIMES DAILY.   losartan 100 MG tablet Commonly known as: COZAAR Take 1 tablet (100 mg total) by mouth daily. What changed: when to take this   Melatonin 10 MG Tabs Take 10 mg by mouth at bedtime.   multivitamin with minerals Tabs tablet Take 1 tablet by mouth in the morning.   propranolol ER 160 MG SR capsule Commonly known as: INDERAL LA Take 1 capsule (160 mg total) by mouth daily. What changed: when to take this   rosuvastatin 20 MG tablet Commonly known as: CRESTOR Take 1 tablet (20 mg total) by mouth daily.   tadalafil 5 MG tablet Commonly known as: CIALIS Take 5 mg by mouth every other day. In the  morning.   zolpidem 12.5 MG CR tablet Commonly known as: AMBIEN CR Take 6.25 mg by mouth at bedtime as needed for sleep.        Allergies:  Allergies  Allergen Reactions   Trazodone Hcl Other (See Comments)    Other reaction(s): too sleepy in AM   Niacin Itching and Other (See Comments)    Flushed feeling   Remeron [Mirtazapine] Other (See Comments)    Other reaction(s): too sleepy in the AM     Past Medical History, Surgical history, Social history, and Family History were reviewed and updated.  Review of Systems: Review of Systems  Constitutional: Negative.   HENT: Negative.    Eyes: Negative.   Respiratory: Negative.    Cardiovascular: Negative.   Gastrointestinal: Negative.   Genitourinary: Negative.   Musculoskeletal: Negative.   Skin:  Negative for rash.  Neurological: Negative.   Endo/Heme/Allergies: Negative.   Psychiatric/Behavioral: Negative.       Physical Exam:  height is 5' 11"$  (1.803 m) and weight is 211 lb 1.9 oz (95.8 kg). His oral temperature is 97.9 F (36.6 C). His blood pressure is 129/82 and his pulse is 67. His respiration is 16 and oxygen saturation is 99%.   Wt Readings from Last 3 Encounters:  06/26/22 211 lb 1.9 oz (95.8 kg)  05/15/22 207 lb (93.9 kg)  05/11/22 210 lb (95.3 kg)    Physical Exam Vitals reviewed.  HENT:     Head: Normocephalic and atraumatic.  Eyes:     Pupils: Pupils are equal, round, and reactive to light.  Cardiovascular:     Rate and Rhythm: Normal rate and regular rhythm.     Heart sounds: Normal heart sounds.  Pulmonary:     Effort: Pulmonary effort is normal.     Breath sounds: Normal breath sounds.  Abdominal:     General: Bowel sounds are normal.     Palpations: Abdomen is soft.  Musculoskeletal:        General: No tenderness or deformity. Normal range of motion.     Cervical back: Normal range of motion.  Lymphadenopathy:     Cervical: No cervical adenopathy.  Skin:    General: Skin is warm and  dry.     Findings: No erythema or rash.  Neurological:     Mental Status: He is alert and oriented to person, place, and time.  Psychiatric:        Behavior: Behavior normal.        Thought Content: Thought content normal.        Judgment: Judgment normal.  Lab Results  Component Value Date   WBC 7.1 06/26/2022   HGB 11.7 (L) 06/26/2022   HCT 35.1 (L) 06/26/2022   MCV 84.4 06/26/2022   PLT 346 06/26/2022   Lab Results  Component Value Date   FERRITIN 68 05/15/2022   IRON 159 06/26/2022   TIBC 379 06/26/2022   UIBC 220 06/26/2022   IRONPCTSAT 42 (H) 06/26/2022   Lab Results  Component Value Date   RETICCTPCT 2.7 06/26/2022   RBC 4.23 06/26/2022   RETICCTABS 87.6 02/21/2011   Lab Results  Component Value Date   KPAFRELGTCHN 10.7 09/27/2021   LAMBDASER 8.5 09/27/2021   KAPLAMBRATIO 1.26 09/27/2021   No results found for: "IGGSERUM", "IGA", "IGMSERUM" No results found for: "TOTALPROTELP", "ALBUMINELP", "A1GS", "A2GS", "BETS", "BETA2SER", "GAMS", "MSPIKE", "SPEI"   Chemistry      Component Value Date/Time   NA 138 06/26/2022 1006   NA 144 04/25/2017 1506   NA 139 04/12/2016 1113   K 3.5 06/26/2022 1006   K 4.2 04/25/2017 1506   K 4.1 04/12/2016 1113   CL 106 06/26/2022 1006   CL 105 04/25/2017 1506   CO2 25 06/26/2022 1006   CO2 29 04/25/2017 1506   CO2 22 04/12/2016 1113   BUN 14 06/26/2022 1006   BUN 17 04/25/2017 1506   BUN 16.3 04/12/2016 1113   CREATININE 0.94 06/26/2022 1006   CREATININE 1.2 04/25/2017 1506   CREATININE 0.9 04/12/2016 1113      Component Value Date/Time   CALCIUM 8.6 (L) 06/26/2022 1006   CALCIUM 9.5 04/25/2017 1506   CALCIUM 9.1 04/12/2016 1113   ALKPHOS 51 06/26/2022 1006   ALKPHOS 73 04/25/2017 1506   ALKPHOS 66 04/12/2016 1113   AST 30 06/26/2022 1006   AST 31 04/12/2016 1113   ALT 26 06/26/2022 1006   ALT 58 (H) 04/25/2017 1506   ALT 36 04/12/2016 1113   BILITOT 1.0 06/26/2022 1006   BILITOT 0.73 04/12/2016 1113       Impression and Plan: [name] [name] is a very pleasant 67 yo caucasian gentleman with polycythemia vera, JAK2 positive.   His platelet count continues to do great.  His hemoglobin is holding steady.  Recently had an updated [place] of his spleen which shows improvement in volume.   We will plan to get him back in about 8  weeks  No phlebotomy today   Hughie Closs, [place] 2/20/20241:07 PM

## 2022-06-27 ENCOUNTER — Other Ambulatory Visit: Payer: Self-pay | Admitting: Hematology & Oncology

## 2022-06-27 ENCOUNTER — Other Ambulatory Visit (HOSPITAL_COMMUNITY): Payer: Self-pay

## 2022-06-27 ENCOUNTER — Telehealth (HOSPITAL_COMMUNITY): Payer: Self-pay | Admitting: *Deleted

## 2022-06-27 DIAGNOSIS — D751 Secondary polycythemia: Secondary | ICD-10-CM

## 2022-06-27 MED ORDER — DILTIAZEM HCL 30 MG PO TABS: ORAL_TABLET | ORAL | 1 refills | Status: AC

## 2022-06-27 NOTE — Telephone Encounter (Signed)
Patient called in stating he has been in AF for the last 2 hours with elevated rates over 100. Pt states usually his afib breaks on it's own after a few minutes but not the case today. BP is stable. Discussed with Adline Peals PA will call in PRN cardizem 61m every 4 hours as needed for elevated rates. Pt will call if AF does not convert.

## 2022-06-27 NOTE — Progress Notes (Deleted)
HPI: FU PAF. Stress echocardiogram June 2011 normal. Monitor February 2020 revealed sinus bradycardia, NSR, sinus tach, pacs, PVC, brief PAT and PAF. Admitted with CVA May 2020. 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 Aspirin added to apixaban by neurology as CVA occurred on apixaban. Patient was seen in follow-up by Dr. Marin Olp and it was felt that polycythemia may be contributing to his recent CVA.  Renal Dopplers May 2021 showed no renal artery stenosis.  Calcium score 5/21 18 which was 71st percentile.  Abdominal ultrasound April 2023 showed no aneurysm.  Echocardiogram May 2023 showed normal LV function, grade 1 diastolic dysfunction, mild right ventricular enlargement and mildly dilated ascending aorta at 39 mm.  Cardiac MRI August 2023 with poor patient tolerance and study was truncated; ejection fraction 54% with normal wall thickness; study felt to be low likelihood for cardiac amyloid; mildly dilated ascending aorta at 40 mm.  Readmitted with atrial fibrillation June 2023.  Patient converted to sinus rhythm spontaneously.  Had successful atrial fibrillation ablation April 13, 2022.  Since last seen   Current Outpatient Medications  Medication Sig Dispense Refill   acetaminophen (TYLENOL) 500 MG tablet Take 1,000 mg by mouth every 6 (six) hours as needed for mild pain.      amitriptyline (ELAVIL) 25 MG tablet TAKE 1 TABLET BY MOUTH EVERYDAY AT BEDTIME 90 tablet 1   apixaban (ELIQUIS) 5 MG TABS tablet Take 1 tablet (5 mg total) by mouth 2 (two) times daily. 180 tablet 3   aspirin EC 81 MG EC tablet Take 1 tablet (81 mg total) by mouth daily. (Patient taking differently: Take 81 mg by mouth at bedtime.) 60 tablet 0   buPROPion (WELLBUTRIN SR) 150 MG 12 hr tablet Take 150 mg by mouth in the morning.     diltiazem  (CARDIZEM CD) 120 MG 24 hr capsule Take 1 capsule (120 mg total) by mouth daily. 90 capsule 3   fluorouracil (EFUDEX) 5 % cream Apply 1 application  topically 2 (two) times daily as needed (dry skin).     gabapentin (NEURONTIN) 300 MG capsule Take 2 capsules (600 mg total) by mouth 2 (two) times daily. 360 capsule 1   hydrOXYzine (VISTARIL) 25 MG capsule 1 capsule 1 hour before bedtime as needed Orally Once a day for 90 days As needed for sleep     losartan (COZAAR) 100 MG tablet Take 1 tablet (100 mg total) by mouth daily. (Patient taking differently: Take 100 mg by mouth every evening.) 90 tablet 3   Melatonin 10 MG TABS Take 10 mg by mouth at bedtime.     Multiple Vitamin (MULTIVITAMIN WITH MINERALS) TABS tablet Take 1 tablet by mouth in the morning.     propranolol ER (INDERAL LA) 160 MG SR capsule Take 1 capsule (160 mg total) by mouth daily. (Patient taking differently: Take 160 mg by mouth every evening.) 90 capsule 3   rosuvastatin (CRESTOR) 20 MG tablet Take 1 tablet (20 mg total) by mouth daily. 90 tablet 3   ruxolitinib phosphate (JAKAFI) 10 MG tablet TAKE 1 TABLET BY MOUTH 2 TIMES DAILY. 60 tablet 4   tadalafil (CIALIS) 5 MG tablet Take 5 mg by mouth every other day. In the morning.     zolpidem (AMBIEN CR) 12.5 MG CR tablet Take 6.25 mg by mouth at bedtime as needed for sleep.  No current facility-administered medications for this visit.     Past Medical History:  Diagnosis Date   Diverticulosis of colon (without mention of hemorrhage)    Hemorrhoids    Hypertension    IBS (irritable bowel syndrome)    Iron deficiency anemia due to chronic blood loss 09/26/2017   Irregular heartbeat    Nephrolithiasis    Other and unspecified hyperlipidemia    Perirectal fistula    Polycythemia, secondary    Stroke (HCC)    Stye    blocker duct right eye lid    Past Surgical History:  Procedure Laterality Date   ATRIAL FIBRILLATION ABLATION N/A 04/13/2022   Procedure: ATRIAL  FIBRILLATION ABLATION;  Surgeon: Constance Haw, MD;  Location: [facility];  Service: Cardiovascular;  Laterality: N/A;   RETINAL DETACHMENT SURGERY     ROTATOR CUFF REPAIR     rt.   VASECTOMY      Social History   Socioeconomic History   Marital status: Married    Spouse name: Mardene Celeste   Number of children: 2   Years of education: Not on file   Highest education level: Not on file  Occupational History   Occupation: Lobbyist: [employer]  Tobacco Use   Smoking status: Never   Smokeless tobacco: Never  Vaping Use   Vaping Use: Never used  Substance and Sexual Activity   Alcohol use: Yes    Alcohol/week: 0.0 standard drinks of alcohol    Comment: Occasional   Drug use: No   Sexual activity: Yes  Other Topics Concern   Not on file  Social History Narrative   Lives with wife   Social Determinants of Health   Financial Resource Strain: Not on file  Food Insecurity: Not on file  Transportation Needs: Not on file  Physical Activity: Not on file  Stress: Not on file  Social Connections: Not on file  Intimate Partner Violence: Not on file    Family History  Problem Relation Age of Onset   Aortic aneurysm Father    Heart attack Brother    Cerebral palsy Brother    Colon cancer Neg Hx    Esophageal cancer Neg Hx    Pancreatic cancer Neg Hx    Prostate cancer Neg Hx    Rectal cancer Neg Hx    Stomach cancer Neg Hx     ROS: no fevers or chills, productive cough, hemoptysis, dysphasia, odynophagia, melena, hematochezia, dysuria, hematuria, rash, seizure activity, orthopnea, PND, pedal edema, claudication. Remaining systems are negative.  Physical Exam: Well-developed well-nourished in no acute distress.  Skin is warm and dry.  HEENT is normal.  Neck is supple.  Chest is clear to auscultation with normal expansion.  Cardiovascular exam is regular rate and rhythm.  Abdominal exam nontender or distended. No masses  palpated. Extremities show no edema. neuro grossly intact  ECG- personally reviewed  A/P  1 paroxysmal atrial fibrillation-patient is in sinus rhythm today status post ablation.  Will continue Cardizem, Inderal and apixaban.  2 hypertension-patient's blood pressure is controlled today.  Continue present medical regimen and follow-up.  3 hyperlipidemia-continue statin.  4 coronary calcification-patient denies chest pain.  Continue statin.  5 question fibromuscular dysplasia on prior CTA-previous renal Doppler showed no renal artery stenosis and no suggestion of fibromuscular dysplasia.  6 history of polycythemia vera-managed by hematology.  Kirk Ruths, MD

## 2022-06-28 ENCOUNTER — Other Ambulatory Visit (HOSPITAL_COMMUNITY): Payer: Self-pay

## 2022-06-28 ENCOUNTER — Other Ambulatory Visit: Payer: Self-pay

## 2022-06-28 MED ORDER — RUXOLITINIB PHOSPHATE 10 MG PO TABS
ORAL_TABLET | Freq: Two times a day (BID) | ORAL | 4 refills | Status: DC
  Filled 2022-06-28 – 2022-06-29 (×2): qty 60, 30d supply, fill #0
  Filled 2022-07-24: qty 60, 30d supply, fill #1
  Filled 2022-08-29: qty 60, 30d supply, fill #2
  Filled 2022-09-24: qty 60, 30d supply, fill #3
  Filled 2022-10-18: qty 60, 30d supply, fill #4

## 2022-06-29 ENCOUNTER — Encounter: Payer: Self-pay | Admitting: Family

## 2022-06-29 ENCOUNTER — Other Ambulatory Visit (HOSPITAL_COMMUNITY): Payer: Self-pay

## 2022-06-29 ENCOUNTER — Telehealth: Payer: Self-pay

## 2022-06-29 NOTE — Telephone Encounter (Signed)
Left VM for patient to call me back to get signatures for application and explain what is going on.

## 2022-06-29 NOTE — Telephone Encounter (Signed)
Patient returned my call and informed me he became eligible for and enrolled in the [employer] program through his insurance Janurary 1st of 2024. I called [employer] at  [phone] to obtain billing information and shared with [facility]. All overrides have been put in place so patient can receive Jakafi at no cost through the remainder of 2024 per [employer].

## 2022-06-29 NOTE — Telephone Encounter (Signed)
Oral Oncology Patient Advocate Encounter   Received notification from [facility] that patient's co-pay card was out of funds. Called [employer] to check status of co-pay card and informed by representative that, for 2024, [employer] will cover a yearly maximum of $9,450. Advised to start PAP Application.    Began application for assistance for Jakafi through [facility] Patient Assistance Program.   Application will be submitted upon completion of necessary supporting documentation.   [employer]' phone number [phone].   I will continue to check the status until final determination.   Berdine Addison, [facility] Patient [place]  [phone] (phone) [phone] (fax) 06/29/2022 8:41 AM

## 2022-07-10 NOTE — Progress Notes (Unsigned)
Electrophysiology Office Note   Date:  07/11/2022   ID:  [name] [name], DOB Jan 02, 1956, MRN [id number]  PCP:  Donnajean Lopes, MD  Cardiologist:  Stanford Breed Primary Electrophysiologist:  Nayden Czajka [name], MD    Chief Complaint: AF   History of Present Illness: [name] [name] is a 67 y.o. male who is being seen today for the evaluation of AF at the request of Donnajean Lopes, MD. Presenting today for electrophysiology evaluation.  He has a history seen for hypertension, hyperlipidemia, CVA, atrial fibrillation.  CVA was May 2020.  It was felt that this was due to fibromuscular dysplasia.  He presented to the hospital 10/17/2021 with rapid atrial fibrillation.  He had sudden onset of brief cramping in his left upper chest.  He converted in the hospital without intervention.  He had atrial fibrillation ablation 04/13/2022.  Today, denies symptoms of palpitations, chest pain, shortness of breath, orthopnea, PND, lower extremity edema, claudication, dizziness, presyncope, syncope, bleeding, or neurologic sequela. The patient is tolerating medications without difficulties.  Since his ablation he has done well.  He has no chest pain or shortness of breath.  He is able to do all of his daily activities.  He is mildly fatigued.  He also has noted an episode of atrial fibrillation 2 weeks ago.  This lasted for a few hours.  He took a dose of diltiazem which improved his atrial fibrillation converted him back to normal rhythm.  He was working in the yard prior to the episode.    Past Medical History:  Diagnosis Date   Diverticulosis of colon (without mention of hemorrhage)    Hemorrhoids    Hypertension    IBS (irritable bowel syndrome)    Iron deficiency anemia due to chronic blood loss 09/26/2017   Irregular heartbeat    Nephrolithiasis    Other and unspecified hyperlipidemia    Perirectal fistula    Polycythemia, secondary    Stroke (HCC)    Stye    blocker duct right eye lid    Past Surgical History:  Procedure Laterality Date   ATRIAL FIBRILLATION ABLATION N/A 04/13/2022   Procedure: ATRIAL FIBRILLATION ABLATION;  Surgeon: Constance Haw, MD;  Location: [place] [facility];  Service: Cardiovascular;  Laterality: N/A;   RETINAL DETACHMENT SURGERY     ROTATOR CUFF REPAIR     rt.   VASECTOMY       Current Outpatient Medications  Medication Sig Dispense Refill   acetaminophen (TYLENOL) 500 MG tablet Take 1,000 mg by mouth every 6 (six) hours as needed for mild pain.      amitriptyline (ELAVIL) 25 MG tablet TAKE 1 TABLET BY MOUTH EVERYDAY AT BEDTIME 90 tablet 1   apixaban (ELIQUIS) 5 MG TABS tablet Take 1 tablet (5 mg total) by mouth 2 (two) times daily. 180 tablet 3   aspirin EC 81 MG EC tablet Take 1 tablet (81 mg total) by mouth daily. (Patient taking differently: Take 81 mg by mouth at bedtime.) 60 tablet 0   buPROPion (WELLBUTRIN SR) 150 MG 12 hr tablet Take 150 mg by mouth in the morning.     diltiazem (CARDIZEM CD) 120 MG 24 hr capsule Take 1 capsule (120 mg total) by mouth daily. 90 capsule 3   diltiazem (CARDIZEM) 30 MG tablet Take 1 tablet every 4 hours AS NEEDED for AFIB heart rate >100 as long as top BP >100. 30 tablet 1   fluorouracil (EFUDEX) 5 % cream Apply 1 application  topically  2 (two) times daily as needed (dry skin).     gabapentin (NEURONTIN) 300 MG capsule Take 2 capsules (600 mg total) by mouth 2 (two) times daily. 360 capsule 1   hydrOXYzine (VISTARIL) 25 MG capsule 1 capsule 1 hour before bedtime as needed Orally Once a day for 90 days As needed for sleep     losartan (COZAAR) 100 MG tablet Take 1 tablet (100 mg total) by mouth daily. (Patient taking differently: Take 100 mg by mouth every evening.) 90 tablet 3   Melatonin 10 MG TABS Take 10 mg by mouth at bedtime.     metoprolol succinate (TOPROL-XL) 50 MG 24 hr tablet Take 1 tablet (50 mg total) by mouth daily. Take with or immediately following a meal. 30 tablet 6   Multiple  Vitamin (MULTIVITAMIN WITH MINERALS) TABS tablet Take 1 tablet by mouth in the morning.     rosuvastatin (CRESTOR) 20 MG tablet Take 1 tablet (20 mg total) by mouth daily. 90 tablet 3   ruxolitinib phosphate (JAKAFI) 10 MG tablet TAKE 1 TABLET BY MOUTH 2 TIMES DAILY. 60 tablet 4   tadalafil (CIALIS) 5 MG tablet Take 5 mg by mouth every other day. In the morning.     zolpidem (AMBIEN CR) 12.5 MG CR tablet Take 6.25 mg by mouth at bedtime as needed for sleep.     No current facility-administered medications for this visit.    Allergies:   Trazodone hcl, Niacin, and Remeron [mirtazapine]   Social History:  The patient  reports that he has never smoked. He has never used smokeless tobacco. He reports current alcohol use. He reports that he does not use drugs.   Family History:  The patient's family history includes Aortic aneurysm in his father; Cerebral palsy in his brother; Heart attack in his brother.   ROS:  Please see the history of present illness.   Otherwise, review of systems is positive for none.   All other systems are reviewed and negative.   PHYSICAL EXAM: VS:  BP 114/68   Pulse 61   Ht '5\' 11"'$  (1.803 m)   Wt 213 lb (96.6 kg)   SpO2 97%   BMI 29.71 kg/m  , BMI Body mass index is 29.71 kg/m. GEN: Well nourished, well developed, in no acute distress  HEENT: normal  Neck: no JVD, carotid bruits, or masses Cardiac: RRR; no murmurs, rubs, or gallops,no edema  Respiratory:  clear to auscultation bilaterally, normal work of breathing GI: soft, nontender, nondistended, + BS MS: no deformity or atrophy  Skin: warm and dry Neuro:  Strength and sensation are intact Psych: euthymic mood, full affect  EKG:  EKG is ordered today. Personal review of the ekg ordered shows sinus rhythm, rate 61, left anterior fascicular block  Recent Labs: 10/17/2021: Magnesium 2.0; TSH 1.713 06/26/2022: ALT 26; BUN 14; Creatinine 0.94; Hemoglobin 11.7; Platelet Count 346; Potassium 3.5; Sodium 138     Lipid Panel     Component Value Date/Time   CHOL 116 12/22/2021 0000   TRIG 155 (H) 12/22/2021 0000   HDL 38 (L) 12/22/2021 0000   CHOLHDL 3.1 12/22/2021 0000   CHOLHDL 5.7 09/13/2018 0634   VLDL 41 (H) 09/13/2018 0634   LDLCALC 52 12/22/2021 0000     Wt Readings from Last 3 Encounters:  07/11/22 213 lb (96.6 kg)  06/26/22 211 lb 1.9 oz (95.8 kg)  05/15/22 207 lb (93.9 kg)      Other studies Reviewed: Additional studies/ records that were  reviewed today include: TTE 09/07/21  Review of the above records today demonstrates:   1. Left ventricular ejection fraction, by estimation, is 55 to 60%. Left  ventricular ejection fraction by 3D volume is 58 %. The left ventricle has  normal function. The left ventricle has no regional wall motion  abnormalities. Left ventricular diastolic   parameters are consistent with Grade I diastolic dysfunction (impaired  relaxation). The average left ventricular global longitudinal strain is  -21.3 %. The global longitudinal strain is normal.   2. Right ventricular systolic function is normal. The right ventricular  size is mildly enlarged. Tricuspid regurgitation signal is inadequate for  assessing PA pressure.   3. The mitral valve is normal in structure. Trivial mitral valve  regurgitation. No evidence of mitral stenosis.   4. The aortic valve is normal in structure. Aortic valve regurgitation is  not visualized. No aortic stenosis is present.   5. Aortic dilatation noted. There is mild dilatation of the ascending  aorta, measuring 39 mm.    ASSESSMENT AND PLAN:  1.  Paroxysmal atrial fibrillation: Currently on Eliquis.  CHA2DS2-VASc of 3.  He is status post ablation 04/13/2022.  His main complaint today is weakness and fatigue.  He otherwise feels well.  He did have an episode of atrial fibrillation 2 weeks ago.  Leviathan Macera stop propranolol and start Toprol-XL 50 mg.  2.  Hypertension: Currently well-controlled  3.  Hyperlipidemia: Continue  statin per primary cardiology  4.  Secondary hypercoagulable state: Currently on Eliquis for atrial fibrillation   Current medicines are reviewed at length with the patient today.   The patient does not have concerns regarding his medicines.  The following changes were made today: Stop propranolol, start metoprolol  Labs/ tests ordered today include:  Orders Placed This Encounter  Procedures   EKG 12-Lead     Disposition:   FU 6 months  Signed, Anneliese Leblond [name], MD  07/11/2022 8:36 AM     [facility] [address] [place]  [place] [address] [phone] (office) [phone] (fax)

## 2022-07-11 ENCOUNTER — Encounter: Payer: Self-pay | Admitting: Cardiology

## 2022-07-11 ENCOUNTER — Ambulatory Visit: Payer: Managed Care, Other (non HMO) | Admitting: Cardiology

## 2022-07-11 ENCOUNTER — Ambulatory Visit: Payer: Managed Care, Other (non HMO) | Attending: Cardiology | Admitting: Cardiology

## 2022-07-11 ENCOUNTER — Other Ambulatory Visit (HOSPITAL_COMMUNITY): Payer: Self-pay

## 2022-07-11 VITALS — BP 114/68 | HR 61 | Ht 71.0 in | Wt 213.0 lb

## 2022-07-11 DIAGNOSIS — D6869 Other thrombophilia: Secondary | ICD-10-CM | POA: Diagnosis not present

## 2022-07-11 DIAGNOSIS — I10 Essential (primary) hypertension: Secondary | ICD-10-CM

## 2022-07-11 DIAGNOSIS — I480 Paroxysmal atrial fibrillation: Secondary | ICD-10-CM | POA: Diagnosis not present

## 2022-07-11 MED ORDER — METOPROLOL SUCCINATE ER 50 MG PO TB24
50.0000 mg | ORAL_TABLET | Freq: Every day | ORAL | 6 refills | Status: DC
  Filled 2022-07-11: qty 30, 30d supply, fill #0
  Filled 2022-08-05: qty 30, 30d supply, fill #1
  Filled 2022-09-04: qty 30, 30d supply, fill #2

## 2022-07-11 NOTE — Patient Instructions (Signed)
Medication Instructions:  Your physician has recommended you make the following change in your medication:  STOP Propranolol START Metoprolol Succinate (Toprol) 50 mg once daily at bedtime  *If you need a refill on your cardiac medications before your next appointment, please call your pharmacy*   Lab Work: None ordered   Testing/Procedures: None ordered   Follow-Up: At [facility], you and your health needs are our priority.  As part of our continuing mission to provide you with exceptional heart care, we have created designated Provider Care Teams.  These Care Teams include your primary Cardiologist (physician) and Advanced Practice Providers (APPs -  Physician Assistants and Nurse Practitioners) who all work together to provide you with the care you need, when you need it.  Your next appointment:   6 month(s)  The format for your next appointment:   In Person  Provider:   Allegra Lai, MD    Thank you for choosing [facility]!!   Trinidad Curet, RN [phone]  Other Instructions

## 2022-07-13 ENCOUNTER — Other Ambulatory Visit (HOSPITAL_COMMUNITY): Payer: Self-pay

## 2022-07-13 ENCOUNTER — Encounter: Payer: Self-pay | Admitting: Pharmacist

## 2022-07-13 ENCOUNTER — Other Ambulatory Visit: Payer: Self-pay

## 2022-07-19 NOTE — Progress Notes (Signed)
HPI: FU PAF. Stress echocardiogram June 2011 normal. Monitor February 2020 revealed sinus bradycardia, NSR, sinus tach, pacs, PVC, brief PAT and PAF. Admitted with CVA May 2020. 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 Aspirin added to apixaban by neurology as CVA occurred on apixaban. Patient was seen in follow-up by Dr. Marin Olp and it was felt that polycythemia may be contributing to his recent CVA.  Renal Dopplers May 2021 showed no renal artery stenosis.  Calcium score 5/21 18 which was 71st percentile.  Abdominal ultrasound April 2023 showed no aneurysm.  Echocardiogram May 2023 showed normal LV function, grade 1 diastolic dysfunction, mild right ventricular enlargement and mildly dilated ascending aorta at 39 mm.  Cardiac MRI August 2023 with poor patient tolerance and study was truncated; ejection fraction 54% with normal wall thickness; study felt to be low likelihood for cardiac amyloid; mildly dilated ascending aorta at 40 mm.  Readmitted with atrial fibrillation June 2023.  Patient converted to sinus rhythm spontaneously.  Cardiac CTA prior to atrial fibrillation ablation December 2023 showed PFO.  Atrial fibrillation ablation was performed April 13, 2022.  Since last seen patient has some fatigue but denies dyspnea, chest pain or syncope.  He has also had brief flutters but no sustained palpitations.    Current Outpatient Medications  Medication Sig Dispense Refill   acetaminophen (TYLENOL) 500 MG tablet Take 1,000 mg by mouth every 6 (six) hours as needed for mild pain.      amitriptyline (ELAVIL) 25 MG tablet TAKE 1 TABLET BY MOUTH EVERYDAY AT BEDTIME 90 tablet 1   apixaban (ELIQUIS) 5 MG TABS tablet Take 1 tablet (5 mg total) by mouth 2 (two) times daily. 180 tablet 3   aspirin EC 81 MG EC tablet Take 1 tablet  (81 mg total) by mouth daily. 60 tablet 0   buPROPion (WELLBUTRIN SR) 150 MG 12 hr tablet Take 75 mg by mouth daily.     diltiazem (CARDIZEM CD) 120 MG 24 hr capsule Take 1 capsule (120 mg total) by mouth daily. 90 capsule 3   diltiazem (CARDIZEM) 30 MG tablet Take 1 tablet every 4 hours AS NEEDED for AFIB heart rate >100 as long as top BP >100. 30 tablet 1   fluorouracil (EFUDEX) 5 % cream Apply 1 application  topically 2 (two) times daily as needed (dry skin).     gabapentin (NEURONTIN) 300 MG capsule Take 2 capsules (600 mg total) by mouth 2 (two) times daily. 360 capsule 1   hydrOXYzine (VISTARIL) 25 MG capsule 1 capsule 1 hour before bedtime as needed Orally Once a day for 90 days As needed for sleep     losartan (COZAAR) 100 MG tablet Take 1 tablet (100 mg total) by mouth daily. 90 tablet 3   Melatonin 10 MG TABS Take 10 mg by mouth at bedtime.     metoprolol succinate (TOPROL-XL) 50 MG 24 hr tablet Take 1 tablet (50 mg total) by mouth daily. Take with or immediately following a meal. 30 tablet 6   Multiple Vitamin (MULTIVITAMIN WITH MINERALS) TABS tablet Take 1 tablet by mouth in the morning.     rosuvastatin (CRESTOR) 20 MG tablet Take 1 tablet (20 mg total) by mouth daily. 90 tablet 3   ruxolitinib phosphate (JAKAFI) 10 MG tablet TAKE 1 TABLET BY MOUTH 2 TIMES DAILY. 60 tablet 4   tadalafil (  CIALIS) 5 MG tablet Take 5 mg by mouth every other day. In the morning.     zolpidem (AMBIEN CR) 12.5 MG CR tablet Take 6.25 mg by mouth at bedtime as needed for sleep.     No current facility-administered medications for this visit.     Past Medical History:  Diagnosis Date   Diverticulosis of colon (without mention of hemorrhage)    Hemorrhoids    Hypertension    IBS (irritable bowel syndrome)    Iron deficiency anemia due to chronic blood loss 09/26/2017   Irregular heartbeat    Nephrolithiasis    Other and unspecified hyperlipidemia    Perirectal fistula    Polycythemia, secondary     Stroke (HCC)    Stye    blocker duct right eye lid    Past Surgical History:  Procedure Laterality Date   ATRIAL FIBRILLATION ABLATION N/A 04/13/2022   Procedure: ATRIAL FIBRILLATION ABLATION;  Surgeon: Constance Haw, MD;  Location: [place] [facility];  Service: Cardiovascular;  Laterality: N/A;   RETINAL DETACHMENT SURGERY     ROTATOR CUFF REPAIR     rt.   VASECTOMY      Social History   Socioeconomic History   Marital status: Married    Spouse name: Mardene Celeste   Number of children: 2   Years of education: Not on file   Highest education level: Not on file  Occupational History   Occupation: Lobbyist: [employer]  Tobacco Use   Smoking status: Never   Smokeless tobacco: Never  Vaping Use   Vaping Use: Never used  Substance and Sexual Activity   Alcohol use: Yes    Alcohol/week: 0.0 standard drinks of alcohol    Comment: Occasional   Drug use: No   Sexual activity: Yes  Other Topics Concern   Not on file  Social History Narrative   Lives with wife   Social Determinants of Health   Financial Resource Strain: Not on file  Food Insecurity: Not on file  Transportation Needs: Not on file  Physical Activity: Not on file  Stress: Not on file  Social Connections: Not on file  Intimate Partner Violence: Not on file    Family History  Problem Relation Age of Onset   Aortic aneurysm Father    Heart attack Brother    Cerebral palsy Brother    Colon cancer Neg Hx    Esophageal cancer Neg Hx    Pancreatic cancer Neg Hx    Prostate cancer Neg Hx    Rectal cancer Neg Hx    Stomach cancer Neg Hx     ROS: no fevers or chills, productive cough, hemoptysis, dysphasia, odynophagia, melena, hematochezia, dysuria, hematuria, rash, seizure activity, orthopnea, PND, pedal edema, claudication. Remaining systems are negative.  Physical Exam: Well-developed well-nourished in no acute distress.  Skin is warm and dry.  HEENT is normal.  Neck is supple.   Chest is clear to auscultation with normal expansion.  Cardiovascular exam is regular rate and rhythm.  Abdominal exam nontender or distended. No masses palpated. Extremities show no edema. neuro grossly intact  A/P  1 paroxysmal atrial fibrillation-patient remains in sinus rhythm status post ablation.  Continue Cardizem, Toprol and apixaban.  2 hypertension-blood pressure controlled.  Continue present medications and follow-up.  3 hyperlipidemia-continue statin.  4 coronary calcification-continue statin.  Patient denies chest pain.  5 polycythemia vera-managed by oncology.  Kirk Ruths, MD

## 2022-07-23 ENCOUNTER — Other Ambulatory Visit (HOSPITAL_COMMUNITY): Payer: Self-pay

## 2022-07-24 ENCOUNTER — Other Ambulatory Visit (HOSPITAL_COMMUNITY): Payer: Self-pay

## 2022-07-25 ENCOUNTER — Ambulatory Visit: Payer: Managed Care, Other (non HMO) | Admitting: Cardiology

## 2022-07-25 ENCOUNTER — Encounter: Payer: Self-pay | Admitting: Cardiology

## 2022-07-25 VITALS — BP 134/79 | HR 66 | Ht 71.0 in | Wt 217.4 lb

## 2022-07-25 DIAGNOSIS — E7800 Pure hypercholesterolemia, unspecified: Secondary | ICD-10-CM | POA: Diagnosis not present

## 2022-07-25 DIAGNOSIS — I480 Paroxysmal atrial fibrillation: Secondary | ICD-10-CM

## 2022-07-25 DIAGNOSIS — I2510 Atherosclerotic heart disease of native coronary artery without angina pectoris: Secondary | ICD-10-CM | POA: Diagnosis not present

## 2022-07-25 DIAGNOSIS — I2584 Coronary atherosclerosis due to calcified coronary lesion: Secondary | ICD-10-CM

## 2022-07-25 DIAGNOSIS — I10 Essential (primary) hypertension: Secondary | ICD-10-CM | POA: Diagnosis not present

## 2022-07-25 NOTE — Patient Instructions (Addendum)
    Follow-Up: At [facility], you and your health needs are our priority.  As part of our continuing mission to provide you with exceptional heart care, we have created designated Provider Care Teams.  These Care Teams include your primary Cardiologist (physician) and Advanced Practice Providers (APPs -  Physician Assistants and Nurse Practitioners) who all work together to provide you with the care you need, when you need it.  We recommend signing up for the patient portal called "MyChart".  Sign up information is provided on this After Visit Summary.  MyChart is used to connect with patients for Virtual Visits (Telemedicine).  Patients are able to view lab/test results, encounter notes, upcoming appointments, etc.  Non-urgent messages can be sent to your provider as well.   To learn more about what you can do with MyChart, go to [email or web address].    Your next appointment:   12 month(s)  Provider:   Brian Crenshaw, MD     

## 2022-07-27 ENCOUNTER — Other Ambulatory Visit: Payer: Self-pay

## 2022-08-05 ENCOUNTER — Encounter: Payer: Self-pay | Admitting: Family

## 2022-08-05 ENCOUNTER — Other Ambulatory Visit (HOSPITAL_COMMUNITY): Payer: Self-pay

## 2022-08-06 ENCOUNTER — Encounter: Payer: Self-pay | Admitting: Family

## 2022-08-06 ENCOUNTER — Other Ambulatory Visit (HOSPITAL_COMMUNITY): Payer: Self-pay

## 2022-08-07 ENCOUNTER — Telehealth: Payer: Self-pay | Admitting: Adult Health

## 2022-08-07 DIAGNOSIS — H524 Presbyopia: Secondary | ICD-10-CM | POA: Diagnosis not present

## 2022-08-07 NOTE — Telephone Encounter (Signed)
LVM and sent mychart msg informing pt of need to reschedule 08/13/22 appointment - NP out

## 2022-08-13 ENCOUNTER — Ambulatory Visit: Payer: Managed Care, Other (non HMO) | Admitting: Adult Health

## 2022-08-13 DIAGNOSIS — Z125 Encounter for screening for malignant neoplasm of prostate: Secondary | ICD-10-CM | POA: Diagnosis not present

## 2022-08-13 DIAGNOSIS — R310 Gross hematuria: Secondary | ICD-10-CM | POA: Diagnosis not present

## 2022-08-13 DIAGNOSIS — R311 Benign essential microscopic hematuria: Secondary | ICD-10-CM | POA: Diagnosis not present

## 2022-08-15 DIAGNOSIS — R310 Gross hematuria: Secondary | ICD-10-CM | POA: Diagnosis not present

## 2022-08-18 ENCOUNTER — Other Ambulatory Visit: Payer: Self-pay | Admitting: Adult Health

## 2022-08-22 DIAGNOSIS — N401 Enlarged prostate with lower urinary tract symptoms: Secondary | ICD-10-CM | POA: Diagnosis not present

## 2022-08-22 DIAGNOSIS — R3914 Feeling of incomplete bladder emptying: Secondary | ICD-10-CM | POA: Diagnosis not present

## 2022-08-23 ENCOUNTER — Other Ambulatory Visit: Payer: Self-pay

## 2022-08-23 DIAGNOSIS — D751 Secondary polycythemia: Secondary | ICD-10-CM

## 2022-08-24 ENCOUNTER — Inpatient Hospital Stay: Payer: Medicare Other | Admitting: Hematology & Oncology

## 2022-08-24 ENCOUNTER — Inpatient Hospital Stay: Payer: Medicare Other

## 2022-08-24 ENCOUNTER — Telehealth: Payer: Self-pay

## 2022-08-24 ENCOUNTER — Other Ambulatory Visit (HOSPITAL_COMMUNITY): Payer: Self-pay

## 2022-08-24 NOTE — Telephone Encounter (Signed)
Oral Oncology Patient Advocate Encounter   Received notification from [facility] that patient's insurance changed from commercial to Medicare Part D. Patient ineligible for grants due to income limits, so starting Adult nurse.  Began application for assistance for Jakafi through [facility] Patient Assistance Program.   Application will be submitted upon completion of necessary supporting documentation.   [employer]' phone number [phone].   I will continue to check the status until final determination.    Ardeen Fillers, CPhT Oncology Pharmacy Patient Advocate  [facility]  [phone] (phone) [phone] (fax) 08/24/2022 12:18 PM

## 2022-08-24 NOTE — Telephone Encounter (Signed)
Oral Oncology Patient Advocate Encounter  Reached out and spoke with patient regarding PAP paperwork, explained that I would send it to their preferred email via DocuSign.   Confirmed email address: [email or web address].    Patient expressed understanding and consent.  Will follow up once paperwork has been signed and returned.   Ardeen Fillers, CPhT Oncology Pharmacy Patient Advocate  [facility]  [phone] (phone) [phone] (fax) 08/24/2022 12:19 PM

## 2022-08-24 NOTE — Telephone Encounter (Signed)
Oral Oncology Patient Advocate Encounter   Submitted application for assistance for Jakafi to [facility] Patient Assistance Program.   Application submitted via e-fax to [phone]   [facility]' phone number [phone].   I will continue to check the status until final determination.    Ardeen Fillers, CPhT Oncology Pharmacy Patient Advocate  [facility]  [phone] (phone) [phone] (fax) 08/24/2022 1:21 PM

## 2022-08-24 NOTE — Telephone Encounter (Signed)
Received notification from [facility] that application was received and in processing. I will continue to follow and update until final determination.    Ardeen Fillers, CPhT Oncology Pharmacy Patient Advocate  [facility]  [phone] (phone) [phone] (fax) 08/24/2022 2:55 PM

## 2022-08-24 NOTE — Telephone Encounter (Signed)
Received back patient signature. I will submit once MD Signature has been returned to me.    Ardeen Fillers, CPhT Oncology Pharmacy Patient Advocate  [facility]  [phone] (phone) [phone] (fax) 08/24/2022 12:43 PM

## 2022-08-27 ENCOUNTER — Telehealth: Payer: Self-pay | Admitting: Cardiology

## 2022-08-27 ENCOUNTER — Encounter: Payer: Self-pay | Admitting: Family

## 2022-08-27 ENCOUNTER — Other Ambulatory Visit (HOSPITAL_COMMUNITY): Payer: Self-pay

## 2022-08-27 DIAGNOSIS — M1812 Unilateral primary osteoarthritis of first carpometacarpal joint, left hand: Secondary | ICD-10-CM | POA: Diagnosis not present

## 2022-08-27 DIAGNOSIS — G5603 Carpal tunnel syndrome, bilateral upper limbs: Secondary | ICD-10-CM | POA: Diagnosis not present

## 2022-08-27 NOTE — Telephone Encounter (Signed)
   Pre-operative Risk Assessment    Patient Name: [name] [name]  DOB: [birth year] MRN: [id number]      Request for Surgical Clearance    Procedure:   Rezum for BPH  Date of Surgery:  Clearance 11/12/22                                 Surgeon:  Dr. Alvester Morin Surgeon's Group or Practice Name:  [facility]  Phone number:  [phone] Fax number:  [phone]   Type of Clearance Requested:   - Pharmacy:  Hold Apixaban (Eliquis) 2 days prior   Type of Anesthesia:   Nitrous   Additional requests/questions:    [name]   08/27/2022, 9:16 AM

## 2022-08-27 NOTE — Telephone Encounter (Signed)
Received notification that [employer] needed Medicare A&B ID Number to continue processing application. Called patient and obtained Medicare #. That was provided and e-faxed to [facility]. I will continue to follow and update until final determination.    Ardeen Fillers, CPhT Oncology Pharmacy Patient Advocate  [facility]  [phone] (phone) [phone] (fax) 08/27/2022 11:06 AM

## 2022-08-27 NOTE — Telephone Encounter (Signed)
   Primary Cardiologist: Olga Millers, MD  Chart reviewed as part of pre-operative protocol coverage. Given past medical history and time since last visit, based on ACC/AHA guidelines, [name] [name] would be at acceptable risk for the planned procedure without further cardiovascular testing.   I called patient to advise that if he develops new symptoms prior to surgery to contact our office to arrange a follow-up appointment.  He verbalized understanding.  Per office protocol, patient can hold Eliquis for 2 days prior to procedure.    I will route this recommendation to the requesting party via Epic fax function and remove from pre-op pool.  Please call with questions.  Levi Aland, NP-C  08/27/2022, 1:13 PM [address], [address] Office [phone] Fax [phone]

## 2022-08-27 NOTE — Telephone Encounter (Signed)
Patient with diagnosis of PAF on Eliquis for anticoagulation.    Procedure: Rezum for BPH  Date of procedure: 11/12/2022   CHA2DS2-VASc Score = 3   This indicates a 3.2% annual risk of stroke. The patient's score is based upon: CHF History: 0 HTN History: 1 Diabetes History: 0 Stroke History: 2 Vascular Disease History: 0 (very low CAC score) Age Score: 0 Gender Score: 0    CrCl 95 mL/min (SrCr 0.94 06/26/2022) Platelet count 346 K (06/26/2022)    Per office protocol, patient can hold Eliquis for 2 days prior to procedure.     **This guidance is not considered finalized until pre-operative APP has relayed final recommendations.**

## 2022-08-28 ENCOUNTER — Other Ambulatory Visit (HOSPITAL_COMMUNITY): Payer: Self-pay

## 2022-08-28 ENCOUNTER — Telehealth: Payer: Self-pay

## 2022-08-28 NOTE — Telephone Encounter (Signed)
Oral Oncology Patient Advocate Encounter  Change in insurance   Received notification that prior authorization for [name] [name] is required.   PA submitted on 08/28/22  Key [id number]  Status is pending     Ardeen Fillers, CPhT Oncology Pharmacy Patient Advocate  [facility]  [phone] (phone) [phone] (fax) 08/28/2022 9:02 AM

## 2022-08-28 NOTE — Telephone Encounter (Signed)
Oral Oncology Patient Advocate Encounter  Prior Authorization for Earvin Hansen has been approved.    PA# [id number]  Effective dates: 08/28/22 through 08/28/23  Patients co-pay is $3,326.41.    Ardeen Fillers, CPhT Oncology Pharmacy Patient Advocate  [facility]  [phone] (phone) [phone] (fax) 08/28/2022 11:01 AM

## 2022-08-28 NOTE — Telephone Encounter (Signed)
Oral Oncology Patient Advocate Encounter   Received notification that the application for assistance for Jakafi through [facility] has been denied due to patient exceeding income limitations for Patient Assistance Program due to 2024 Medicare changes.   [employer]' phone number [phone].   Ardeen Fillers, CPhT Oncology Pharmacy Patient Advocate  [facility]  [phone] (phone) [phone] (fax) 08/28/2022 11:15 AM

## 2022-08-29 ENCOUNTER — Other Ambulatory Visit (HOSPITAL_COMMUNITY): Payer: Self-pay

## 2022-08-30 ENCOUNTER — Other Ambulatory Visit (HOSPITAL_COMMUNITY): Payer: Self-pay

## 2022-08-30 NOTE — Telephone Encounter (Signed)
Patient understands they were over income limit for Patient Assistance and [employer] and will pay co-pay for [facility]. Patient is aware of cost.    Ardeen Fillers, CPhT Oncology Pharmacy Patient Advocate  [facility]  [phone] (phone) [phone] (fax) 08/30/2022 11:10 AM

## 2022-08-31 ENCOUNTER — Other Ambulatory Visit: Payer: Self-pay

## 2022-08-31 ENCOUNTER — Other Ambulatory Visit (HOSPITAL_COMMUNITY): Payer: Self-pay

## 2022-09-04 ENCOUNTER — Other Ambulatory Visit (HOSPITAL_COMMUNITY): Payer: Self-pay

## 2022-09-05 HISTORY — PX: CARPAL TUNNEL RELEASE: SHX101

## 2022-09-06 ENCOUNTER — Telehealth: Payer: Self-pay | Admitting: Cardiology

## 2022-09-06 NOTE — Telephone Encounter (Signed)
Spoke with the patient who states that he was previously on propanolol and saw Dr. Elberta Fortis a couple of months ago and was having some fatigue. He was switched to metoprolol. He states that since starting the metoprolol he has noticed an increase in his palpitations. He reports that he also has been getting more short of breath with minimal exertion. No shortness of breath at rest. No lightheadedness or dizziness. He states that doing simple tasks will cause him to become winded. Advised that I would let Dr. Elberta Fortis know and we will call him back with recommendations on any med changes.

## 2022-09-06 NOTE — Telephone Encounter (Signed)
Pt c/o medication issue:  1. Name of Medication: metoprolol succinate (TOPROL-XL) 50 MG 24 hr tablet   2. How are you currently taking this medication (dosage and times per day)?    3. Are you having a reaction (difficulty breathing--STAT)? no  4. What is your medication issue? Patient states that he feels more sob at times. And wanted to discuss this with the dr. Please advise

## 2022-09-07 ENCOUNTER — Encounter: Payer: Self-pay | Admitting: Family

## 2022-09-07 MED ORDER — PROPRANOLOL HCL ER 160 MG PO CP24: 160.0000 mg | ORAL_CAPSULE | Freq: Every day | ORAL | 2 refills | Status: DC

## 2022-09-07 NOTE — Telephone Encounter (Signed)
Pt agreeable to switching back to Propranolol 160 mg daily at bedtime. Pt does not want Rx sent in at this time as he still has a full bottle at home. He will let us know if AFib symptoms worsen after change.

## 2022-09-10 DIAGNOSIS — M180 Bilateral primary osteoarthritis of first carpometacarpal joints: Secondary | ICD-10-CM | POA: Diagnosis not present

## 2022-09-10 DIAGNOSIS — G5602 Carpal tunnel syndrome, left upper limb: Secondary | ICD-10-CM | POA: Diagnosis not present

## 2022-09-10 DIAGNOSIS — M1812 Unilateral primary osteoarthritis of first carpometacarpal joint, left hand: Secondary | ICD-10-CM | POA: Diagnosis not present

## 2022-09-14 ENCOUNTER — Other Ambulatory Visit: Payer: Self-pay

## 2022-09-14 ENCOUNTER — Inpatient Hospital Stay: Payer: Medicare Other | Attending: Hematology & Oncology

## 2022-09-14 ENCOUNTER — Inpatient Hospital Stay: Payer: Medicare Other

## 2022-09-14 ENCOUNTER — Inpatient Hospital Stay (HOSPITAL_BASED_OUTPATIENT_CLINIC_OR_DEPARTMENT_OTHER): Payer: Medicare Other | Admitting: Hematology & Oncology

## 2022-09-14 ENCOUNTER — Encounter: Payer: Self-pay | Admitting: Hematology & Oncology

## 2022-09-14 VITALS — BP 128/72 | HR 58 | Temp 98.5°F | Resp 16 | Ht 71.0 in | Wt 212.0 lb

## 2022-09-14 DIAGNOSIS — D751 Secondary polycythemia: Secondary | ICD-10-CM

## 2022-09-14 DIAGNOSIS — Z7901 Long term (current) use of anticoagulants: Secondary | ICD-10-CM | POA: Insufficient documentation

## 2022-09-14 DIAGNOSIS — E611 Iron deficiency: Secondary | ICD-10-CM | POA: Insufficient documentation

## 2022-09-14 DIAGNOSIS — D45 Polycythemia vera: Secondary | ICD-10-CM | POA: Insufficient documentation

## 2022-09-14 LAB — CMP (CANCER CENTER ONLY)
ALT: 25 U/L (ref 0–44)
AST: 25 U/L (ref 15–41)
Albumin: 5 g/dL (ref 3.5–5.0)
Alkaline Phosphatase: 46 U/L (ref 38–126)
Anion gap: 9 (ref 5–15)
BUN: 21 mg/dL (ref 8–23)
CO2: 33 mmol/L — ABNORMAL HIGH (ref 22–32)
Calcium: 10.3 mg/dL (ref 8.9–10.3)
Chloride: 101 mmol/L (ref 98–111)
Creatinine: 1.18 mg/dL (ref 0.61–1.24)
GFR, Estimated: >60 mL/min (ref >60)
Glucose, Bld: 92 mg/dL (ref 70–99)
Potassium: 4 mmol/L (ref 3.5–5.1)
Sodium: 143 mmol/L (ref 135–145)
Total Bilirubin: 0.9 mg/dL (ref 0.3–1.2)
Total Protein: 7 g/dL (ref 6.5–8.1)

## 2022-09-14 LAB — CBC WITH DIFFERENTIAL (CANCER CENTER ONLY)
Abs Immature Granulocytes: 0.24 10*3/uL — ABNORMAL HIGH (ref 0.00–0.07)
Basophils Absolute: 0.1 10*3/uL (ref 0.0–0.1)
Basophils Relative: 1 %
Eosinophils Absolute: 0.5 10*3/uL (ref 0.0–0.5)
Eosinophils Relative: 6 %
HCT: 40.6 % (ref 39.0–52.0)
Hemoglobin: 13 g/dL (ref 13.0–17.0)
Immature Granulocytes: 3 %
Lymphocytes Relative: 13 %
Lymphs Abs: 1.3 10*3/uL (ref 0.7–4.0)
MCH: 27.3 pg (ref 26.0–34.0)
MCHC: 32 g/dL (ref 30.0–36.0)
MCV: 85.1 fL (ref 80.0–100.0)
Monocytes Absolute: 1 10*3/uL (ref 0.1–1.0)
Monocytes Relative: 11 %
Neutro Abs: 6.5 10*3/uL (ref 1.7–7.7)
Neutrophils Relative %: 66 %
Platelet Count: 443 10*3/uL — ABNORMAL HIGH (ref 150–400)
RBC: 4.77 MIL/uL (ref 4.22–5.81)
RDW: 19 % — ABNORMAL HIGH (ref 11.5–15.5)
WBC Count: 9.8 10*3/uL (ref 4.0–10.5)
nRBC: 1 % — ABNORMAL HIGH (ref 0.0–0.2)

## 2022-09-14 LAB — IRON AND IRON BINDING CAPACITY (CC-WL,HP ONLY)
Iron: 167 ug/dL (ref 45–182)
Saturation Ratios: 37 % (ref 17.9–39.5)
TIBC: 455 ug/dL — ABNORMAL HIGH (ref 250–450)
UIBC: 288 ug/dL (ref 117–376)

## 2022-09-14 LAB — RETICULOCYTES
Immature Retic Fract: 27.1 % — ABNORMAL HIGH (ref 2.3–15.9)
RBC.: 4.71 MIL/uL (ref 4.22–5.81)
Retic Count, Absolute: 139.4 10*3/uL (ref 19.0–186.0)
Retic Ct Pct: 3 % (ref 0.4–3.1)

## 2022-09-14 LAB — LACTATE DEHYDROGENASE: LDH: 451 U/L — ABNORMAL HIGH (ref 98–192)

## 2022-09-14 LAB — FERRITIN: Ferritin: 49 ng/mL (ref 24–336)

## 2022-09-14 NOTE — Progress Notes (Signed)
Hematology and Oncology Follow Up Visit  [name] [name] [id number] [birth year] 66 y.o. 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  He actually had carpal tunnel surgery for his left hand on Monday.  He looks quite good.  He does have a soft cast on the wrist.  Since we last saw him, has been doing well.  He will be doing some traveling.  He and his wife will be going to [place] in a week or so.  Then in June, they will be going to the beach with her whole family.  I think in July, they might be going out to [place].  He just got back from [place].  He is retired.  He truly is quite active during retirement.  He had to have the carpal tunnel surgery because he cannot play golf.  He is doing well on the [place].  He has had no problems with Jakafi.  There is no nausea or vomiting.  He has had no diarrhea.  He has had no cough or shortness of breath.  He does have a little bit of fullness in the right upper quadrant.  He has had no leg swelling.  He has had no rashes.  There is been no headache.  He continues on Eliquis and aspirin.  He actually did not have to stop either of these for his carpal tunnel surgery.  I think in July or August, he is going had to have prostate surgery.  Currently, I would say his performance status is probably ECOG 1.   Medications:  Allergies as of 09/14/2022       Reactions   Trazodone Hcl Other (See Comments)   Other reaction(s): too sleepy in AM   Mirtazapine Other (See Comments)    Other reaction(s): too sleepy in the AM Other Reaction(s): Other (See Comments)    Other reaction(s): too sleepy in the AM   Niacin Itching, Other (See Comments)   Flushed feeling Other Reaction(s): Other (See Comments)    Flushed feeling        Medication List        Accurate as of Sep 14, 2022 12:33 PM. If you have any questions, ask your nurse or doctor.          acetaminophen 500 MG tablet Commonly known as: TYLENOL Take 1,000 mg by mouth every 6 (six) hours as needed for mild pain.   amitriptyline 25 MG tablet Commonly known as: ELAVIL TAKE 1 TABLET BY MOUTH EVERYDAY AT BEDTIME   aspirin EC 81 MG tablet Take 1 tablet (81 mg total) by mouth daily.   buPROPion 150 MG 12 hr tablet Commonly known as: WELLBUTRIN SR Take 75 mg by mouth daily.   diltiazem 120 MG 24 hr capsule Commonly known as: CARDIZEM CD Take 1 capsule (120 mg total) by mouth daily.   diltiazem 30 MG tablet Commonly known as: Cardizem Take 1 tablet every 4 hours AS NEEDED for AFIB heart rate >100 as long as top BP >100.  Eliquis 5 MG Tabs tablet Generic drug: apixaban Take 1 tablet (5 mg total) by mouth 2 (two) times daily.   fluorouracil 5 % cream Commonly known as: EFUDEX Apply 1 application  topically 2 (two) times daily as needed (dry skin).   gabapentin 300 MG capsule Commonly known as: NEURONTIN Take 2 capsules (600 mg total) by mouth 2 (two) times daily.   hydrOXYzine 25 MG capsule Commonly known as: VISTARIL 1 capsule 1 hour before bedtime as needed Orally Once a day for 90 days As needed for sleep   Jakafi 10 MG tablet Generic drug: ruxolitinib phosphate TAKE 1 TABLET BY MOUTH 2 TIMES DAILY.   losartan 100 MG tablet Commonly known as: COZAAR Take 1 tablet (100 mg total) by mouth daily.   Melatonin 10 MG Tabs Take 10 mg by mouth at bedtime.   multivitamin with minerals Tabs tablet Take 1 tablet by mouth in the morning.   propranolol ER 160 MG SR capsule Commonly  known as: INDERAL LA Take 1 capsule (160 mg total) by mouth at bedtime.   rosuvastatin 20 MG tablet Commonly known as: CRESTOR Take 1 tablet (20 mg total) by mouth daily.   tadalafil 5 MG tablet Commonly known as: CIALIS Take 5 mg by mouth every other day. In the morning.   zolpidem 12.5 MG CR tablet Commonly known as: AMBIEN CR Take 6.25 mg by mouth at bedtime as needed for sleep.        Allergies:  Allergies  Allergen Reactions   Trazodone Hcl Other (See Comments)    Other reaction(s): too sleepy in AM   Mirtazapine Other (See Comments)    Other reaction(s): too sleepy in the AM  Other Reaction(s): Other (See Comments)    Other reaction(s): too sleepy in the AM   Niacin Itching and Other (See Comments)    Flushed feeling  Other Reaction(s): Other (See Comments)    Flushed feeling     Past Medical History, Surgical history, Social history, and Family History were reviewed and updated.  Review of Systems: Review of Systems  Constitutional: Negative.   HENT: Negative.    Eyes: Negative.   Respiratory: Negative.    Cardiovascular: Negative.   Gastrointestinal: Negative.   Genitourinary: Negative.   Musculoskeletal: Negative.   Skin:  Positive for rash.  Neurological: Negative.   Endo/Heme/Allergies: Negative.   Psychiatric/Behavioral: Negative.       Physical Exam:  height is 5\' 11"  (1.803 m) and weight is 212 lb (96.2 kg). His oral temperature is 98.5 F (36.9 C). His blood pressure is 128/72 and his pulse is 58 (abnormal). His respiration is 16 and oxygen saturation is 96%.   Wt Readings from Last 3 Encounters:  09/14/22 212 lb (96.2 kg)  07/25/22 217 lb 6.4 oz (98.6 kg)  07/11/22 213 lb (96.6 kg)    Physical Exam Vitals reviewed.  HENT:     Head: Normocephalic and atraumatic.  Eyes:     Pupils: Pupils are equal, round, and reactive to light.  Cardiovascular:     Rate and Rhythm: Normal rate and regular rhythm.     Heart sounds: Normal heart  sounds.  Pulmonary:     Effort: Pulmonary effort is normal.     Breath sounds: Normal breath sounds.  Abdominal:     General: Bowel sounds are normal.     Palpations: Abdomen is soft.     Comments: I cannot palpate his spleen.  Musculoskeletal:        General:  No tenderness or deformity. Normal range of motion.     Cervical back: Normal range of motion.     Comments: He does have an Ace wrapping on the left wrist.  Lymphadenopathy:     Cervical: No cervical adenopathy.  Skin:    General: Skin is warm and dry.     Findings: No erythema or rash.     Comments: He has the healing zoster rash in the left T7 dermatome.  There is some sensitivity to touch in this area.  Neurological:     Mental Status: He is alert and oriented to person, place, and time.  Psychiatric:        Behavior: Behavior normal.        Thought Content: Thought content normal.        Judgment: Judgment normal.      Lab Results  Component Value Date   WBC 9.8 09/14/2022   HGB 13.0 09/14/2022   HCT 40.6 09/14/2022   MCV 85.1 09/14/2022   PLT 443 (H) 09/14/2022   Lab Results  Component Value Date   FERRITIN 68 06/26/2022   IRON 159 06/26/2022   TIBC 379 06/26/2022   UIBC 220 06/26/2022   IRONPCTSAT 42 (H) 06/26/2022   Lab Results  Component Value Date   RETICCTPCT 3.0 09/14/2022   RBC 4.71 09/14/2022   RBC 4.77 09/14/2022   RETICCTABS 87.6 02/21/2011   Lab Results  Component Value Date   KPAFRELGTCHN 10.7 09/27/2021   LAMBDASER 8.5 09/27/2021   KAPLAMBRATIO 1.26 09/27/2021   No results found for: "IGGSERUM", "IGA", "IGMSERUM" No results found for: "TOTALPROTELP", "ALBUMINELP", "A1GS", "A2GS", "BETS", "BETA2SER", "GAMS", "MSPIKE", "SPEI"   Chemistry      Component Value Date/Time   NA 143 09/14/2022 1130   NA 144 04/25/2017 1506   NA 139 04/12/2016 1113   K 4.0 09/14/2022 1130   K 4.2 04/25/2017 1506   K 4.1 04/12/2016 1113   CL 101 09/14/2022 1130   CL 105 04/25/2017 1506   CO2 33 (H)  09/14/2022 1130   CO2 29 04/25/2017 1506   CO2 22 04/12/2016 1113   BUN 21 09/14/2022 1130   BUN 17 04/25/2017 1506   BUN 16.3 04/12/2016 1113   CREATININE 1.18 09/14/2022 1130   CREATININE 1.2 04/25/2017 1506   CREATININE 0.9 04/12/2016 1113      Component Value Date/Time   CALCIUM 10.3 09/14/2022 1130   CALCIUM 9.5 04/25/2017 1506   CALCIUM 9.1 04/12/2016 1113   ALKPHOS 46 09/14/2022 1130   ALKPHOS 73 04/25/2017 1506   ALKPHOS 66 04/12/2016 1113   AST 25 09/14/2022 1130   AST 31 04/12/2016 1113   ALT 25 09/14/2022 1130   ALT 58 (H) 04/25/2017 1506   ALT 36 04/12/2016 1113   BILITOT 0.9 09/14/2022 1130   BILITOT 0.73 04/12/2016 1113       Impression and Plan: [name] [name] is a very pleasant 67 yo caucasian gentleman with polycythemia vera, JAK2 positive.   His platelet count is up a little bit.  I suspect this might be inflammatory from his recent surgery.  I am not to change the Jakafi dose.  I think we do need to get an ultrasound of his abdomen to assess his splenomegaly when we see him back.  I would also like to have the ultrasound to assess his liver.  I am just happy that surgery went well for him.  I will plan to see him back in July.  Ever since  we started the [facility], we really not had to do any phlebotomies on him.   Josph Macho, MD 5/10/202412:33 PM

## 2022-09-19 ENCOUNTER — Other Ambulatory Visit (HOSPITAL_COMMUNITY): Payer: Self-pay

## 2022-09-20 ENCOUNTER — Other Ambulatory Visit (HOSPITAL_COMMUNITY): Payer: Self-pay

## 2022-09-24 ENCOUNTER — Other Ambulatory Visit (HOSPITAL_COMMUNITY): Payer: Self-pay

## 2022-09-24 ENCOUNTER — Encounter: Payer: Self-pay | Admitting: Family

## 2022-09-24 DIAGNOSIS — R351 Nocturia: Secondary | ICD-10-CM | POA: Diagnosis not present

## 2022-09-24 DIAGNOSIS — G5603 Carpal tunnel syndrome, bilateral upper limbs: Secondary | ICD-10-CM | POA: Diagnosis not present

## 2022-09-24 DIAGNOSIS — R3914 Feeling of incomplete bladder emptying: Secondary | ICD-10-CM | POA: Diagnosis not present

## 2022-09-25 ENCOUNTER — Encounter: Payer: Self-pay | Admitting: Family Medicine

## 2022-09-25 ENCOUNTER — Encounter: Payer: Self-pay | Admitting: Family

## 2022-09-25 ENCOUNTER — Ambulatory Visit: Payer: Managed Care, Other (non HMO) | Admitting: Family Medicine

## 2022-09-25 VITALS — BP 116/70 | HR 57 | Ht 71.5 in | Wt 214.0 lb

## 2022-09-25 DIAGNOSIS — Z8673 Personal history of transient ischemic attack (TIA), and cerebral infarction without residual deficits: Secondary | ICD-10-CM | POA: Diagnosis not present

## 2022-09-25 DIAGNOSIS — G4730 Sleep apnea, unspecified: Secondary | ICD-10-CM

## 2022-09-25 DIAGNOSIS — R202 Paresthesia of skin: Secondary | ICD-10-CM

## 2022-09-25 DIAGNOSIS — G44221 Chronic tension-type headache, intractable: Secondary | ICD-10-CM

## 2022-09-25 DIAGNOSIS — G629 Polyneuropathy, unspecified: Secondary | ICD-10-CM | POA: Diagnosis not present

## 2022-09-25 DIAGNOSIS — R4789 Other speech disturbances: Secondary | ICD-10-CM

## 2022-09-25 MED ORDER — AMITRIPTYLINE HCL 25 MG PO TABS: ORAL_TABLET | ORAL | 1 refills | Status: DC

## 2022-09-25 MED ORDER — GABAPENTIN 300 MG PO CAPS
600.0000 mg | ORAL_CAPSULE | Freq: Two times a day (BID) | ORAL | 1 refills | Status: DC
Start: 2022-09-25 — End: 2023-04-03

## 2022-09-25 NOTE — Patient Instructions (Addendum)
Below is our plan:  We will continue gabapentin 600mg  twice daily and amitriptyline 25mg  at bedtime. Consider weaning Ambien and hydroxyzine to see if this helps with word finding difficulty. Consider neurocognitive evaluation if needed.    Please make sure you are staying well hydrated. I recommend 50-60 ounces daily. Well balanced diet and regular exercise encouraged. Consistent sleep schedule with 6-8 hours recommended.   Please continue follow up with care team as directed.   Follow up with Shanda Bumps in 6 months   You may receive a survey regarding today's visit. I encourage you to leave honest feed back as I do use this information to improve patient care. Thank you for seeing me today!   Management of Memory Problems   There are some general things you can do to help manage your memory problems.  Your memory may not in fact recover, but by using techniques and strategies you will be able to manage your memory difficulties better.   1)  Establish a routine. Try to establish and then stick to a regular routine.  By doing this, you will get used to what to expect and you will reduce the need to rely on your memory.  Also, try to do things at the same time of day, such as taking your medication or checking your calendar first thing in the morning. Think about think that you can do as a part of a regular routine and make a list.  Then enter them into a daily planner to remind you.  This will help you establish a routine.   2)  Organize your environment. Organize your environment so that it is uncluttered.  Decrease visual stimulation.  Place everyday items such as keys or cell phone in the same place every day (ie.  Basket next to front door) Use post it notes with a brief message to yourself (ie. Turn off light, lock the door) Use labels to indicate where things go (ie. Which cupboards are for food, dishes, etc.) Keep a notepad and pen by the telephone to take messages   3)  Memory Aids A  diary or journal/notebook/daily planner Making a list (shopping list, chore list, to do list that needs to be done) Using an alarm as a reminder (kitchen timer or cell phone alarm) Using cell phone to store information (Notes, Calendar, Reminders) Calendar/White board placed in a prominent position Post-it notes   In order for memory aids to be useful, you need to have good habits.  It's no good remembering to make a note in your journal if you don't remember to look in it.  Try setting aside a certain time of day to look in journal.   4)  Improving mood and managing fatigue. There may be other factors that contribute to memory difficulties.  Factors, such as anxiety, depression and tiredness can affect memory. Regular gentle exercise can help improve your mood and give you more energy. Exercise: there are short videos created by the [employer] on Health specially for older adults: https://bit.ly/2I30q97.  Mediterranean diet: which emphasizes fruits, vegetables, whole grains, legumes, fish, and other seafood; unsaturated fats such as olive oils; and low amounts of red meat, eggs, and sweets. A variation of this, called MIND (Mediterranean-DASH Intervention for Neurodegenerative Delay) incorporates the DASH (Dietary Approaches to Stop Hypertension) diet, which has been shown to lower high blood pressure, a risk factor for Alzheimer's disease. More information at: ExitMarketing.de.  Aerobic exercise that improve heart health is also good for the  mind.  [employer] Aging have short videos for exercises that you can do at home: BlindWorkshop.com.pt Simple relaxation techniques may help relieve symptoms of anxiety Try to get back to completing activities or hobbies you enjoyed doing in the past. Learn to pace yourself through activities to decrease fatigue. Find out about some local support groups where you  can share experiences with others. Try and achieve 7-8 hours of sleep at night.

## 2022-09-25 NOTE — Progress Notes (Signed)
Chief Complaint  Patient presents with   Follow-up    Pt in room 1. Here for stoke follow up. Pt still has neuropathy in feet and leg, had carpal tunnel surgery beginning of May.. Pt noticed in last several months headaches maybe one every 3 week, quick sharp pain that comes and goes no need to take medication for this because it happens so fast.. Pt has also noticed he has hard time getting words out.     HISTORY OF PRESENT ILLNESS:  09/25/22 ALL:  [name] [name] is a 67 y.o. male here today for follow up for CVA, neuropathy and headaches. He continues gabapentin 600mg  BID and amitriptyline 25mg  daily. He continues asa 81mg  and rosuvastatin daily for stroke prevention. On Eliquis for atrial fib.   He reports neuropathy is stable. He continues to have some discomfort L>R, especially at night. No difficulty walking. He is s/p left CTR two weeks ago. He feels he is recovering well. He continues to have numbness of left thumb. Thought to be related to osteoarthritis.   Headaches are well managed. He has 1-2 tension style headaches a month, on average. He feels headaches have improved since retiring. He may have to take Tylenol 3-4 times a month. He continues to have random sharp shoot pain in the left temporal region that comes and goes. Pain lasts a few seconds then resolves. May happen 1-2 times per month.   He continues to have difficulty with word finding difficulty. No difficulty with memory. Able to manage home, drive and perform ADLs without difficulty. He admits to having some anxiety related to health history. Sleep waxes and wanes. Occasionally He continues to use oral appliance for management of OSA. He alternates Ambien and hydroxyzine for insomnia.   03/14/2022 JM: Patient returns for 76-month follow-up unaccompanied.   Overall stable from stroke standpoint.  No new stroke/TIA symptoms.  Remains on Eliquis, aspirin and Crestor.  Blood pressure well controlled.  Routinely follows with  PCP Dr. [name] and cardiologist Dr. [name].   Continued mild headaches approximately 2-3 times per week.  Usually not debilitating or severe.  Continued occasional sharp zap type sensation but less frequent.  Remains on gabapentin.    Continued L>R LE neuropathy, more bothersome at night.  Denies being debilitating or interfering with daily functioning but more of a discomfort.   Nightly use of dental device, continues to follow with dentistry   REVIEW OF SYSTEMS: Out of a complete 14 system review of symptoms, the patient complains only of the following symptoms, headaches, word finding difficulty, neuropathy, insomnia and all other reviewed systems are negative.   ALLERGIES: Allergies  Allergen Reactions   Trazodone Hcl Other (See Comments)    Other reaction(s): too sleepy in AM   Mirtazapine Other (See Comments)    Other reaction(s): too sleepy in the AM  Other Reaction(s): Other (See Comments)    Other reaction(s): too sleepy in the AM   Niacin Itching and Other (See Comments)    Flushed feeling  Other Reaction(s): Other (See Comments)    Flushed feeling     HOME MEDICATIONS: Outpatient Medications Prior to Visit  Medication Sig Dispense Refill   acetaminophen (TYLENOL) 500 MG tablet Take 1,000 mg by mouth every 6 (six) hours as needed for mild pain.      apixaban (ELIQUIS) 5 MG TABS tablet Take 1 tablet (5 mg total) by mouth 2 (two) times daily. 180 tablet 3   aspirin EC 81 MG EC tablet Take  1 tablet (81 mg total) by mouth daily. 60 tablet 0   diltiazem (CARDIZEM CD) 120 MG 24 hr capsule Take 1 capsule (120 mg total) by mouth daily. 90 capsule 3   diltiazem (CARDIZEM) 30 MG tablet Take 1 tablet every 4 hours AS NEEDED for AFIB heart rate >100 as long as top BP >100. 30 tablet 1   fluorouracil (EFUDEX) 5 % cream Apply 1 application  topically 2 (two) times daily as needed (dry skin).     hydrOXYzine (VISTARIL) 25 MG capsule 1 capsule 1 hour before bedtime as needed  Orally Once a day for 90 days As needed for sleep     losartan (COZAAR) 100 MG tablet Take 1 tablet (100 mg total) by mouth daily. 90 tablet 3   Melatonin 10 MG TABS Take 10 mg by mouth at bedtime.     Multiple Vitamin (MULTIVITAMIN WITH MINERALS) TABS tablet Take 1 tablet by mouth in the morning.     propranolol ER (INDERAL LA) 160 MG SR capsule Take 1 capsule (160 mg total) by mouth at bedtime. 90 capsule 2   rosuvastatin (CRESTOR) 20 MG tablet Take 1 tablet (20 mg total) by mouth daily. 90 tablet 3   ruxolitinib phosphate (JAKAFI) 10 MG tablet TAKE 1 TABLET BY MOUTH 2 TIMES DAILY. 60 tablet 4   tadalafil (CIALIS) 5 MG tablet Take 5 mg by mouth every other day. In the morning.     zolpidem (AMBIEN CR) 12.5 MG CR tablet Take 6.25 mg by mouth at bedtime as needed for sleep.     amitriptyline (ELAVIL) 25 MG tablet TAKE 1 TABLET BY MOUTH EVERYDAY AT BEDTIME 90 tablet 1   gabapentin (NEURONTIN) 300 MG capsule Take 2 capsules (600 mg total) by mouth 2 (two) times daily. 360 capsule 1   buPROPion (WELLBUTRIN SR) 150 MG 12 hr tablet Take 75 mg by mouth daily. (Patient not taking: Reported on 09/25/2022)     No facility-administered medications prior to visit.     PAST MEDICAL HISTORY: Past Medical History:  Diagnosis Date   Diverticulosis of colon (without mention of hemorrhage)    Hemorrhoids    Hypertension    IBS (irritable bowel syndrome)    Iron deficiency anemia due to chronic blood loss 09/26/2017   Irregular heartbeat    Nephrolithiasis    Other and unspecified hyperlipidemia    Perirectal fistula    Polycythemia, secondary    Stroke (HCC)    Stye    blocker duct right eye lid     PAST SURGICAL HISTORY: Past Surgical History:  Procedure Laterality Date   ATRIAL FIBRILLATION ABLATION N/A 04/13/2022   Procedure: ATRIAL FIBRILLATION ABLATION;  Surgeon: Regan Lemming, MD;  Location: [facility];  Service: Cardiovascular;  Laterality: N/A;   RETINAL DETACHMENT  SURGERY     ROTATOR CUFF REPAIR     rt.   VASECTOMY       FAMILY HISTORY: Family History  Problem Relation Age of Onset   Aortic aneurysm Father    Heart attack Brother    Cerebral palsy Brother    Colon cancer Neg Hx    Esophageal cancer Neg Hx    Pancreatic cancer Neg Hx    Prostate cancer Neg Hx    Rectal cancer Neg Hx    Stomach cancer Neg Hx      SOCIAL HISTORY: Social History   Socioeconomic History   Marital status: Married    Spouse name: Elease Hashimoto   Number  of children: 2   Years of education: Not on file   Highest education level: Not on file  Occupational History   Occupation: ENGINEER    Employer: [employer]  Tobacco Use   Smoking status: Never   Smokeless tobacco: Never  Vaping Use   Vaping Use: Never used  Substance and Sexual Activity   Alcohol use: Yes    Alcohol/week: 0.0 standard drinks of alcohol    Comment: Occasional   Drug use: No   Sexual activity: Yes  Other Topics Concern   Not on file  Social History Narrative   Lives with wife   Social Determinants of Health   Financial Resource Strain: Not on file  Food Insecurity: Not on file  Transportation Needs: Not on file  Physical Activity: Not on file  Stress: Not on file  Social Connections: Not on file  Intimate Partner Violence: Not on file     PHYSICAL EXAM  Vitals:   09/25/22 0918  BP: 116/70  Pulse: (!) 57  Weight: 214 lb (97.1 kg)  Height: 5' 11.5" (1.816 m)   Body mass index is 29.43 kg/m.  Generalized: Well developed, in no acute distress  Cardiology: normal rate and rhythm, no murmur auscultated  Respiratory: clear to auscultation bilaterally    Neurological examination  Mentation: Alert oriented to time, place, history taking. Follows all commands speech and language fluent Cranial nerve II-XII: Pupils were equal round reactive to light. Extraocular movements were full, visual field were full on confrontational test. Facial sensation and strength were  normal. Uvula tongue midline. Head turning and shoulder shrug  were normal and symmetric. Motor: The motor testing reveals 5 over 5 strength of all 4 extremities. Good symmetric motor tone is noted throughout.  Sensory: Sensory testing is intact to soft touch on all 4 extremities. No evidence of extinction is noted.  Coordination: Cerebellar testing reveals good finger-nose-finger and heel-to-shin bilaterally.  Gait and station: Gait is normal.  Reflexes: Deep tendon reflexes are symmetric and normal bilaterally.    DIAGNOSTIC DATA (LABS, IMAGING, TESTING) - I reviewed patient records, labs, notes, testing and imaging myself where available.  Lab Results  Component Value Date   WBC 9.8 09/14/2022   HGB 13.0 09/14/2022   HCT 40.6 09/14/2022   MCV 85.1 09/14/2022   PLT 443 (H) 09/14/2022      Component Value Date/Time   NA 143 09/14/2022 1130   NA 144 04/25/2017 1506   NA 139 04/12/2016 1113   K 4.0 09/14/2022 1130   K 4.2 04/25/2017 1506   K 4.1 04/12/2016 1113   CL 101 09/14/2022 1130   CL 105 04/25/2017 1506   CO2 33 (H) 09/14/2022 1130   CO2 29 04/25/2017 1506   CO2 22 04/12/2016 1113   GLUCOSE 92 09/14/2022 1130   GLUCOSE 95 04/25/2017 1506   BUN 21 09/14/2022 1130   BUN 17 04/25/2017 1506   BUN 16.3 04/12/2016 1113   CREATININE 1.18 09/14/2022 1130   CREATININE 1.2 04/25/2017 1506   CREATININE 0.9 04/12/2016 1113   CALCIUM 10.3 09/14/2022 1130   CALCIUM 9.5 04/25/2017 1506   CALCIUM 9.1 04/12/2016 1113   PROT 7.0 09/14/2022 1130   PROT 6.4 04/25/2017 1506   PROT 7.0 04/12/2016 1113   ALBUMIN 5.0 09/14/2022 1130   ALBUMIN 3.8 04/25/2017 1506   ALBUMIN 4.6 09/12/2016 1457   ALBUMIN 4.2 04/12/2016 1113   AST 25 09/14/2022 1130   AST 31 04/12/2016 1113   ALT 25  09/14/2022 1130   ALT 58 (H) 04/25/2017 1506   ALT 36 04/12/2016 1113   ALKPHOS 46 09/14/2022 1130   ALKPHOS 73 04/25/2017 1506   ALKPHOS 66 04/12/2016 1113   BILITOT 0.9 09/14/2022 1130   BILITOT  0.73 04/12/2016 1113   GFRNONAA >60 09/14/2022 1130   GFRAA >60 12/24/2019 0944   Lab Results  Component Value Date   CHOL 116 12/22/2021   HDL 38 (L) 12/22/2021   LDLCALC 52 12/22/2021   TRIG 155 (H) 12/22/2021   CHOLHDL 3.1 12/22/2021   Lab Results  Component Value Date   HGBA1C 4.8 09/13/2018   Lab Results  Component Value Date   VITAMINB12 1,133 08/22/2020   Lab Results  Component Value Date   TSH 1.713 10/17/2021        No data to display               No data to display           ASSESSMENT AND PLAN  67 y.o. year old male  has a past medical history of Diverticulosis of colon (without mention of hemorrhage), Hemorrhoids, Hypertension, IBS (irritable bowel syndrome), Iron deficiency anemia due to chronic blood loss (09/26/2017), Irregular heartbeat, Nephrolithiasis, Other and unspecified hyperlipidemia, Perirectal fistula, Polycythemia, secondary, Stroke (HCC), and Stye. here with    History of stroke  Chronic tension-type headache, intractable  Small fiber neuropathy - Plan: gabapentin (NEURONTIN) 300 MG capsule  Mild sleep apnea  Paresthesias  Word finding difficulty  [name] [name] reports symptoms are stable. No new TIA/stroke like symptoms. We will continue gabapentin 600mg  BID and amitriptyline 25mg  QHS. We have reviewed concerns of word finding difficulty. Likely multifactorial. We have discussed weaning sedating meds in the future if possible. No concerns of dementia raised. He was encouraged to use memory compensation strategies and focus on regular physical and mental exercise. Consider eval of OSA on oral appliance. Healthy lifestyle habits encouraged. He will follow up with PCP as directed. He will return to see Shanda Bumps in 6 months, sooner if needed. He verbalizes understanding and agreement with this plan.   No orders of the defined types were placed in this encounter.    Meds ordered this encounter  Medications   amitriptyline  (ELAVIL) 25 MG tablet    Sig: TAKE 1 TABLET BY MOUTH EVERYDAY AT BEDTIME    Dispense:  90 tablet    Refill:  1    Order Specific Question:   Supervising Provider    Answer:   Anson Fret [[id number]]   gabapentin (NEURONTIN) 300 MG capsule    Sig: Take 2 capsules (600 mg total) by mouth 2 (two) times daily.    Dispense:  360 capsule    Refill:  1    Order Specific Question:   Supervising Provider    Answer:   Anson Fret [[id number]]     XBJ YNWGN, MSN, FNP-C 09/25/2022, 11:18 AM  [facility] [address], [address] [place], [place] [address] [phone]

## 2022-09-26 ENCOUNTER — Other Ambulatory Visit (HOSPITAL_COMMUNITY): Payer: Self-pay

## 2022-09-28 ENCOUNTER — Other Ambulatory Visit: Payer: Self-pay | Admitting: Cardiology

## 2022-09-28 DIAGNOSIS — R002 Palpitations: Secondary | ICD-10-CM

## 2022-10-10 DIAGNOSIS — L570 Actinic keratosis: Secondary | ICD-10-CM | POA: Diagnosis not present

## 2022-10-10 DIAGNOSIS — C4441 Basal cell carcinoma of skin of scalp and neck: Secondary | ICD-10-CM | POA: Diagnosis not present

## 2022-10-10 DIAGNOSIS — L738 Other specified follicular disorders: Secondary | ICD-10-CM | POA: Diagnosis not present

## 2022-10-10 DIAGNOSIS — D485 Neoplasm of uncertain behavior of skin: Secondary | ICD-10-CM | POA: Diagnosis not present

## 2022-10-10 DIAGNOSIS — L820 Inflamed seborrheic keratosis: Secondary | ICD-10-CM | POA: Diagnosis not present

## 2022-10-16 DIAGNOSIS — G5603 Carpal tunnel syndrome, bilateral upper limbs: Secondary | ICD-10-CM | POA: Diagnosis not present

## 2022-10-18 ENCOUNTER — Other Ambulatory Visit: Payer: Self-pay

## 2022-10-18 ENCOUNTER — Other Ambulatory Visit: Payer: Self-pay | Admitting: Cardiology

## 2022-10-19 ENCOUNTER — Other Ambulatory Visit (HOSPITAL_COMMUNITY): Payer: Self-pay

## 2022-10-22 ENCOUNTER — Other Ambulatory Visit: Payer: Self-pay

## 2022-10-22 ENCOUNTER — Other Ambulatory Visit (HOSPITAL_COMMUNITY): Payer: Self-pay

## 2022-10-26 ENCOUNTER — Other Ambulatory Visit (HOSPITAL_COMMUNITY): Payer: Self-pay

## 2022-10-29 ENCOUNTER — Other Ambulatory Visit (HOSPITAL_COMMUNITY): Payer: Self-pay

## 2022-11-14 ENCOUNTER — Ambulatory Visit: Payer: Managed Care, Other (non HMO) | Admitting: Adult Health

## 2022-11-15 ENCOUNTER — Encounter: Payer: Self-pay | Admitting: Family

## 2022-11-19 ENCOUNTER — Ambulatory Visit (HOSPITAL_BASED_OUTPATIENT_CLINIC_OR_DEPARTMENT_OTHER)
Admission: RE | Admit: 2022-11-19 | Discharge: 2022-11-19 | Disposition: A | Discharge status: Home / Self Care | Payer: Medicare Other | Source: Ambulatory Visit | Attending: Hematology & Oncology | Admitting: Hematology & Oncology

## 2022-11-19 DIAGNOSIS — D751 Secondary polycythemia: Secondary | ICD-10-CM | POA: Insufficient documentation

## 2022-11-19 DIAGNOSIS — R161 Splenomegaly, not elsewhere classified: Secondary | ICD-10-CM | POA: Diagnosis not present

## 2022-11-21 ENCOUNTER — Other Ambulatory Visit (HOSPITAL_COMMUNITY): Payer: Self-pay

## 2022-11-21 ENCOUNTER — Other Ambulatory Visit: Payer: Self-pay

## 2022-11-21 ENCOUNTER — Encounter: Payer: Self-pay | Admitting: Hematology & Oncology

## 2022-11-21 ENCOUNTER — Inpatient Hospital Stay: Payer: Medicare Other | Attending: Hematology & Oncology

## 2022-11-21 ENCOUNTER — Inpatient Hospital Stay: Payer: Medicare Other | Admitting: Hematology & Oncology

## 2022-11-21 ENCOUNTER — Other Ambulatory Visit: Payer: Self-pay | Admitting: Hematology & Oncology

## 2022-11-21 VITALS — BP 117/78 | HR 58 | Temp 97.7°F | Resp 16 | Ht 71.0 in | Wt 216.0 lb

## 2022-11-21 DIAGNOSIS — Z8673 Personal history of transient ischemic attack (TIA), and cerebral infarction without residual deficits: Secondary | ICD-10-CM | POA: Diagnosis not present

## 2022-11-21 DIAGNOSIS — Z7901 Long term (current) use of anticoagulants: Secondary | ICD-10-CM | POA: Diagnosis not present

## 2022-11-21 DIAGNOSIS — D751 Secondary polycythemia: Secondary | ICD-10-CM | POA: Diagnosis not present

## 2022-11-21 DIAGNOSIS — D500 Iron deficiency anemia secondary to blood loss (chronic): Secondary | ICD-10-CM | POA: Diagnosis not present

## 2022-11-21 DIAGNOSIS — D45 Polycythemia vera: Secondary | ICD-10-CM | POA: Insufficient documentation

## 2022-11-21 DIAGNOSIS — E611 Iron deficiency: Secondary | ICD-10-CM | POA: Insufficient documentation

## 2022-11-21 LAB — LACTATE DEHYDROGENASE: LDH: 417 U/L — ABNORMAL HIGH (ref 98–192)

## 2022-11-21 LAB — CBC WITH DIFFERENTIAL (CANCER CENTER ONLY)
Abs Immature Granulocytes: 0.07 10*3/uL (ref 0.00–0.07)
Basophils Absolute: 0.1 10*3/uL (ref 0.0–0.1)
Basophils Relative: 2 %
Eosinophils Absolute: 0.3 10*3/uL (ref 0.0–0.5)
Eosinophils Relative: 5 %
HCT: 36.9 % — ABNORMAL LOW (ref 39.0–52.0)
Hemoglobin: 12.3 g/dL — ABNORMAL LOW (ref 13.0–17.0)
Immature Granulocytes: 1 %
Lymphocytes Relative: 15 %
Lymphs Abs: 0.9 10*3/uL (ref 0.7–4.0)
MCH: 28.4 pg (ref 26.0–34.0)
MCHC: 33.3 g/dL (ref 30.0–36.0)
MCV: 85.2 fL (ref 80.0–100.0)
Monocytes Absolute: 0.7 10*3/uL (ref 0.1–1.0)
Monocytes Relative: 10 %
Neutro Abs: 4.1 10*3/uL (ref 1.7–7.7)
Neutrophils Relative %: 67 %
Platelet Count: 310 10*3/uL (ref 150–400)
RBC: 4.33 MIL/uL (ref 4.22–5.81)
RDW: 18.7 % — ABNORMAL HIGH (ref 11.5–15.5)
WBC Count: 6.3 10*3/uL (ref 4.0–10.5)
nRBC: 1.3 % — ABNORMAL HIGH (ref 0.0–0.2)

## 2022-11-21 LAB — IRON AND IRON BINDING CAPACITY (CC-WL,HP ONLY)
Iron: 173 ug/dL (ref 45–182)
Saturation Ratios: 43 % — ABNORMAL HIGH (ref 17.9–39.5)
TIBC: 407 ug/dL (ref 250–450)
UIBC: 234 ug/dL (ref 117–376)

## 2022-11-21 LAB — CMP (CANCER CENTER ONLY)
ALT: 21 U/L (ref 0–44)
AST: 28 U/L (ref 15–41)
Albumin: 4.9 g/dL (ref 3.5–5.0)
Alkaline Phosphatase: 44 U/L (ref 38–126)
Anion gap: 9 (ref 5–15)
BUN: 22 mg/dL (ref 8–23)
CO2: 25 mmol/L (ref 22–32)
Calcium: 9.2 mg/dL (ref 8.9–10.3)
Chloride: 106 mmol/L (ref 98–111)
Creatinine: 1.18 mg/dL (ref 0.61–1.24)
GFR, Estimated: >60 mL/min (ref >60)
Glucose, Bld: 117 mg/dL — ABNORMAL HIGH (ref 70–99)
Potassium: 3.8 mmol/L (ref 3.5–5.1)
Sodium: 140 mmol/L (ref 135–145)
Total Bilirubin: 1 mg/dL (ref 0.3–1.2)
Total Protein: 6.5 g/dL (ref 6.5–8.1)

## 2022-11-21 LAB — FERRITIN: Ferritin: 93 ng/mL (ref 24–336)

## 2022-11-21 MED ORDER — RUXOLITINIB PHOSPHATE 10 MG PO TABS
ORAL_TABLET | Freq: Two times a day (BID) | ORAL | 4 refills | Status: DC
  Filled 2022-11-21: qty 60, 30d supply, fill #0
  Filled 2022-12-25: qty 60, 30d supply, fill #1
  Filled 2023-01-21: qty 60, 30d supply, fill #2
  Filled 2023-02-27: qty 60, 30d supply, fill #3
  Filled 2023-04-01: qty 60, 30d supply, fill #4

## 2022-11-21 NOTE — Progress Notes (Signed)
Hematology and Oncology Follow Up Visit  [name] [name] [id number] 02-23-1956 67 y.o. 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  Unfortunately, he hurt his back.  He said he felt his back pull on him.  He was not doing anything strenuous.  He is not playing golf he is not twisting.  Not lifting or picking up anything.  He says this happens every few years.  He has been taking Tylenol for this.  I told that he could take an occasional Aleve or Advil despite being on Eliquis.  I told him to take the Aleve or Advil with food.  We did do a sonogram of his spleen.  This was done on 11/19/2022.  His spleen was a little bit larger.  It went from 553 cm up to 744 cm.  He is doing well on the [place].  He has had no nausea or vomiting.  He said no change in bowel or bladder habits.  He is on medicine to help with his urination.  He has had no leg swelling.  He has had no rashes.  He and his wife are about to go up to [facility] to see where their children.  He and his wife are always traveling to visit one of the kids.  His iron studies show ferritin of 93 with an iron saturation of 43%.  He is still recovering from the carpal tunnel surgery that he had back in I think April or May.  Currently, his performance status is ECOG 1.    Medications:  Allergies as of 11/21/2022       Reactions   Trazodone Hcl Other (See Comments)   Other reaction(s): too sleepy in AM   Mirtazapine Other (See Comments)   Other reaction(s): too sleepy  in the AM Other Reaction(s): Other (See Comments)    Other reaction(s): too sleepy in the AM   Niacin Itching, Other (See Comments)   Flushed feeling Other Reaction(s): Other (See Comments)    Flushed feeling        Medication List        Accurate as of November 21, 2022  4:45 PM. If you have any questions, ask your nurse or doctor.          STOP taking these medications    buPROPion 150 MG 12 hr tablet Commonly known as: WELLBUTRIN SR Stopped by: Josph Macho       TAKE these medications    acetaminophen 500 MG tablet Commonly known as: TYLENOL Take 1,000 mg by mouth every 6 (six) hours as needed for mild pain.   amitriptyline 25 MG tablet Commonly known as: ELAVIL TAKE 1 TABLET BY MOUTH EVERYDAY AT BEDTIME   aspirin EC 81 MG tablet Take 1 tablet (81 mg total) by mouth daily.   diltiazem 120 MG 24 hr capsule Commonly known as: CARDIZEM CD Take 1 capsule (120 mg total) by mouth daily.   diltiazem 30 MG tablet Commonly known as: Cardizem Take 1 tablet every 4  hours AS NEEDED for AFIB heart rate >100 as long as top BP >100.   Eliquis 5 MG Tabs tablet Generic drug: apixaban Take 1 tablet (5 mg total) by mouth 2 (two) times daily.   fluorouracil 5 % cream Commonly known as: EFUDEX Apply 1 application  topically 2 (two) times daily as needed (dry skin).   gabapentin 300 MG capsule Commonly known as: NEURONTIN Take 2 capsules (600 mg total) by mouth 2 (two) times daily.   Gemtesa 75 MG Tabs Generic drug: Vibegron Take 1 tablet by mouth daily.   hydrOXYzine 25 MG capsule Commonly known as: VISTARIL 1 capsule 1 hour before bedtime as needed Orally Once a day for 90 days As needed for sleep   losartan 100 MG tablet Commonly known as: COZAAR Take 1 tablet (100 mg total) by mouth daily.   Melatonin 10 MG Tabs Take 10 mg by mouth at bedtime.   multivitamin with minerals Tabs tablet Take 1 tablet by mouth in the morning.   propranolol ER 160 MG SR  capsule Commonly known as: INDERAL LA TAKE 1 CAPSULE BY MOUTH EVERY DAY   rosuvastatin 20 MG tablet Commonly known as: CRESTOR Take 1 tablet (20 mg total) by mouth daily.   ruxolitinib phosphate 10 MG tablet Commonly known as: JAKAFI TAKE 1 TABLET BY MOUTH 2 TIMES DAILY.   tadalafil 5 MG tablet Commonly known as: CIALIS Take 5 mg by mouth every other day. In the morning.   zolpidem 12.5 MG CR tablet Commonly known as: AMBIEN CR Take 6.25 mg by mouth at bedtime as needed for sleep.        Allergies:  Allergies  Allergen Reactions   Trazodone Hcl Other (See Comments)    Other reaction(s): too sleepy in AM   Mirtazapine Other (See Comments)    Other reaction(s): too sleepy in the AM  Other Reaction(s): Other (See Comments)    Other reaction(s): too sleepy in the AM   Niacin Itching and Other (See Comments)    Flushed feeling  Other Reaction(s): Other (See Comments)    Flushed feeling     Past Medical History, Surgical history, Social history, and Family History were reviewed and updated.  Review of Systems: Review of Systems  Constitutional: Negative.   HENT: Negative.    Eyes: Negative.   Respiratory: Negative.    Cardiovascular: Negative.   Gastrointestinal: Negative.   Genitourinary: Negative.   Musculoskeletal: Negative.   Skin:  Positive for rash.  Neurological: Negative.   Endo/Heme/Allergies: Negative.   Psychiatric/Behavioral: Negative.       Physical Exam:  height is 5\' 11"  (1.803 m) and weight is 216 lb (98 kg). His oral temperature is 97.7 F (36.5 C). His blood pressure is 117/78 and his pulse is 58 (abnormal). His respiration is 16 and oxygen saturation is 97%.   Wt Readings from Last 3 Encounters:  11/21/22 216 lb (98 kg)  09/25/22 214 lb (97.1 kg)  09/14/22 212 lb (96.2 kg)    Physical Exam Vitals reviewed.  HENT:     Head: Normocephalic and atraumatic.  Eyes:     Pupils: Pupils are equal, round, and reactive to light.   Cardiovascular:     Rate and Rhythm: Normal rate and regular rhythm.     Heart sounds: Normal heart sounds.  Pulmonary:     Effort: Pulmonary effort is normal.     Breath sounds: Normal breath sounds.  Abdominal:     General: Bowel sounds are normal.  Palpations: Abdomen is soft.     Comments: I cannot palpate his spleen.  Musculoskeletal:        General: No tenderness or deformity. Normal range of motion.     Cervical back: Normal range of motion.     Comments: He does have an Ace wrapping on the left wrist.  Lymphadenopathy:     Cervical: No cervical adenopathy.  Skin:    General: Skin is warm and dry.     Findings: No erythema or rash.     Comments: He has the healing zoster rash in the left T7 dermatome.  There is some sensitivity to touch in this area.  Neurological:     Mental Status: He is alert and oriented to person, place, and time.  Psychiatric:        Behavior: Behavior normal.        Thought Content: Thought content normal.        Judgment: Judgment normal.      Lab Results  Component Value Date   WBC 6.3 11/21/2022   HGB 12.3 (L) 11/21/2022   HCT 36.9 (L) 11/21/2022   MCV 85.2 11/21/2022   PLT 310 11/21/2022   Lab Results  Component Value Date   FERRITIN 93 11/21/2022   IRON 173 11/21/2022   TIBC 407 11/21/2022   UIBC 234 11/21/2022   IRONPCTSAT 43 (H) 11/21/2022   Lab Results  Component Value Date   RETICCTPCT 3.0 09/14/2022   RBC 4.33 11/21/2022   RETICCTABS 87.6 02/21/2011   Lab Results  Component Value Date   KPAFRELGTCHN 10.7 09/27/2021   LAMBDASER 8.5 09/27/2021   KAPLAMBRATIO 1.26 09/27/2021   No results found for: "IGGSERUM", "IGA", "IGMSERUM" No results found for: "TOTALPROTELP", "ALBUMINELP", "A1GS", "A2GS", "BETS", "BETA2SER", "GAMS", "MSPIKE", "SPEI"   Chemistry      Component Value Date/Time   NA 140 11/21/2022 0904   NA 144 04/25/2017 1506   NA 139 04/12/2016 1113   K 3.8 11/21/2022 0904   K 4.2 04/25/2017 1506   K  4.1 04/12/2016 1113   CL 106 11/21/2022 0904   CL 105 04/25/2017 1506   CO2 25 11/21/2022 0904   CO2 29 04/25/2017 1506   CO2 22 04/12/2016 1113   BUN 22 11/21/2022 0904   BUN 17 04/25/2017 1506   BUN 16.3 04/12/2016 1113   CREATININE 1.18 11/21/2022 0904   CREATININE 1.2 04/25/2017 1506   CREATININE 0.9 04/12/2016 1113      Component Value Date/Time   CALCIUM 9.2 11/21/2022 0904   CALCIUM 9.5 04/25/2017 1506   CALCIUM 9.1 04/12/2016 1113   ALKPHOS 44 11/21/2022 0904   ALKPHOS 73 04/25/2017 1506   ALKPHOS 66 04/12/2016 1113   AST 28 11/21/2022 0904   AST 31 04/12/2016 1113   ALT 21 11/21/2022 0904   ALT 58 (H) 04/25/2017 1506   ALT 36 04/12/2016 1113   BILITOT 1.0 11/21/2022 0904   BILITOT 0.73 04/12/2016 1113       Impression and Plan: [name] [name] is a very pleasant 67 yo caucasian gentleman with polycythemia vera, JAK2 positive.   His platelet count is doing quite nicely right now.  Again we will have to watch for the splenomegaly.  This hopefully will not be more of a problem for him.  He denies anything about having surgery for prostate enlargement.  Hopefully the Leslye Peer will help him out.  I would like to get him back in about 6 weeks just for follow-up.  Hopefully, his back  will get better.  I told him if his back does not improve to let us know we will certainly help him out.    Josph Macho, MD 7/17/20244:45 PM

## 2022-11-23 ENCOUNTER — Other Ambulatory Visit (HOSPITAL_COMMUNITY): Payer: Self-pay

## 2022-11-26 DIAGNOSIS — C4441 Basal cell carcinoma of skin of scalp and neck: Secondary | ICD-10-CM | POA: Diagnosis not present

## 2022-11-30 ENCOUNTER — Encounter: Payer: Self-pay | Admitting: Family

## 2022-11-30 ENCOUNTER — Encounter: Payer: Self-pay | Admitting: Cardiology

## 2022-11-30 ENCOUNTER — Ambulatory Visit: Payer: Medicare Other | Admitting: Cardiology

## 2022-11-30 VITALS — BP 120/72 | HR 55 | Ht 71.0 in | Wt 215.2 lb

## 2022-11-30 DIAGNOSIS — I10 Essential (primary) hypertension: Secondary | ICD-10-CM

## 2022-11-30 DIAGNOSIS — I480 Paroxysmal atrial fibrillation: Secondary | ICD-10-CM

## 2022-11-30 DIAGNOSIS — R002 Palpitations: Secondary | ICD-10-CM | POA: Diagnosis not present

## 2022-11-30 DIAGNOSIS — D6869 Other thrombophilia: Secondary | ICD-10-CM | POA: Diagnosis not present

## 2022-11-30 DIAGNOSIS — L905 Scar conditions and fibrosis of skin: Secondary | ICD-10-CM | POA: Diagnosis not present

## 2022-11-30 NOTE — Patient Instructions (Signed)
Medication Instructions:  Your physician recommends that you continue on your current medications as directed. Please refer to the Current Medication list given to you today.  *If you need a refill on your cardiac medications before your next appointment, please call your pharmacy*   Lab Work: None ordered   Testing/Procedures: None ordered   Follow-Up: At [facility], you and your health needs are our priority.  As part of our continuing mission to provide you with exceptional heart care, we have created designated Provider Care Teams.  These Care Teams include your primary Cardiologist (physician) and Advanced Practice Providers (APPs -  Physician Assistants and Nurse Practitioners) who all work together to provide you with the care you need, when you need it.  Your next appointment:   6 month(s)  The format for your next appointment:   In Person  Provider:   You will follow up in the Atrial Fibrillation Clinic located at [facility]. Your provider will be: Clint R. Fenton, PA-C  Or  Jon Suarez, PA  Thank you for choosing [facility]!!    Sherri Price, RN [phone]          

## 2022-11-30 NOTE — Progress Notes (Signed)
  Electrophysiology Office Note:   Date:  11/30/2022  ID:  Shamal Upshaw, DOB 07/27/1955, MRN [id number]  Primary Cardiologist: Olga Millers, MD Electrophysiologist: Regan Lemming, MD      History of Present Illness:   [name] [name] is a 67 y.o. male with h/o fibrillation seen today for routine electrophysiology followup.  Since last being seen in our clinic the patient reports doing well.  He was previously switched to metoprolol.  He he felt poorly on this.  He was switched back to his propranolol.  He felt better on this medication.  He has not had minimal palpitations.  he denies chest pain, palpitations, dyspnea, PND, orthopnea, nausea, vomiting, dizziness, syncope, edema, weight gain, or early satiety.     He has a history seen for hypertension, hyperlipidemia, CVA, atrial fibrillation.  CVA was May 2020.  It was felt that this was due to fibromuscular dysplasia. He presented to the hospital 10/17/2021 with rapid atrial fibrillation.  He had sudden onset of brief cramping in his left upper chest.  He converted in the hospital without intervention.  He had atrial fibrillation ablation 04/13/2022.     Review of systems complete and found to be negative unless listed in HPI.   EP Information / Studies Reviewed:    EKG is ordered today. Personal review as below.  EKG Interpretation Date/Time:  Friday November 30 2022 10:12:00 EDT Ventricular Rate:  55 PR Interval:  210 QRS Duration:  116 QT Interval:  446 QTC Calculation: 426 R Axis:   -41  Text Interpretation: Sinus bradycardia with 1st degree A-V block Left axis deviation Left ventricular hypertrophy with QRS widening ( R in aVL , Cornell product ) Nonspecific T wave abnormality When compared with ECG of 11-May-2022 08:59, No significant change since last tracing Confirmed by Darris Staiger ([id number]) on 11/30/2022 10:17:15 AM     Risk Assessment/Calculations:    CHA2DS2-VASc Score = 3   This indicates a 3.2% annual risk of  stroke. The patient's score is based upon: CHF History: 0 HTN History: 1 Diabetes History: 0 Stroke History: 2 Vascular Disease History: 0 (very low CAC score) Age Score: 0 Gender Score: 0             Physical Exam:   VS:  BP 120/72 (BP Location: Left Arm, Patient Position: Sitting, Cuff Size: Large)   Pulse (!) 55   Ht 5\' 11"  (1.803 m)   Wt 215 lb 3.2 oz (97.6 kg)   SpO2 95%   BMI 30.01 kg/m    Wt Readings from Last 3 Encounters:  11/30/22 215 lb 3.2 oz (97.6 kg)  11/21/22 216 lb (98 kg)  09/25/22 214 lb (97.1 kg)     GEN: Well nourished, well developed in no acute distress NECK: No JVD; No carotid bruits CARDIAC: Regular rate and rhythm, no murmurs, rubs, gallops RESPIRATORY:  Clear to auscultation without rales, wheezing or rhonchi  ABDOMEN: Soft, non-tender, non-distended EXTREMITIES:  No edema; No deformity   ASSESSMENT AND PLAN:    1.  Paroxysmal atrial fibrillation: Currently on Eliquis and propranolol.  He is feeling well.  He has had minimal episodes of atrial fibrillation since his ablation.  Ablation 04/13/2022.  Janann Boeve continue with current management.  2.  Hypertension: Currently well-controlled  3.  Hyperlipidemia: Continue statin per primary cardiology  4.  Secondary hypercoagulable state: Currently on Eliquis for atrial fibrillation  Follow up with [facility] in 6 months  Signed, Nancy Manuele Jorja Loa, MD

## 2022-12-10 ENCOUNTER — Encounter: Payer: Self-pay | Admitting: Family

## 2022-12-18 ENCOUNTER — Ambulatory Visit (HOSPITAL_BASED_OUTPATIENT_CLINIC_OR_DEPARTMENT_OTHER)
Admission: RE | Admit: 2022-12-18 | Discharge: 2022-12-18 | Disposition: A | Discharge status: Home / Self Care | Payer: Medicare Other | Source: Ambulatory Visit | Attending: Registered Nurse | Admitting: Registered Nurse

## 2022-12-18 ENCOUNTER — Other Ambulatory Visit (HOSPITAL_BASED_OUTPATIENT_CLINIC_OR_DEPARTMENT_OTHER): Payer: Self-pay | Admitting: Registered Nurse

## 2022-12-18 DIAGNOSIS — R1012 Left upper quadrant pain: Secondary | ICD-10-CM

## 2022-12-18 DIAGNOSIS — N390 Urinary tract infection, site not specified: Secondary | ICD-10-CM | POA: Diagnosis not present

## 2022-12-18 DIAGNOSIS — R1032 Left lower quadrant pain: Secondary | ICD-10-CM | POA: Diagnosis not present

## 2022-12-18 DIAGNOSIS — R161 Splenomegaly, not elsewhere classified: Secondary | ICD-10-CM | POA: Diagnosis not present

## 2022-12-18 DIAGNOSIS — N132 Hydronephrosis with renal and ureteral calculous obstruction: Secondary | ICD-10-CM | POA: Diagnosis not present

## 2022-12-18 DIAGNOSIS — N179 Acute kidney failure, unspecified: Secondary | ICD-10-CM | POA: Diagnosis not present

## 2022-12-18 DIAGNOSIS — R197 Diarrhea, unspecified: Secondary | ICD-10-CM | POA: Diagnosis not present

## 2022-12-18 DIAGNOSIS — K429 Umbilical hernia without obstruction or gangrene: Secondary | ICD-10-CM | POA: Diagnosis not present

## 2022-12-18 DIAGNOSIS — K5730 Diverticulosis of large intestine without perforation or abscess without bleeding: Secondary | ICD-10-CM | POA: Diagnosis not present

## 2022-12-18 DIAGNOSIS — R0600 Dyspnea, unspecified: Secondary | ICD-10-CM | POA: Diagnosis not present

## 2022-12-18 NOTE — Progress Notes (Signed)
Called report to Dr Eleonore Chiquito at approx 7:30 pm - Gave her the patient's phone number per her request

## 2022-12-19 DIAGNOSIS — N200 Calculus of kidney: Secondary | ICD-10-CM | POA: Diagnosis not present

## 2022-12-19 DIAGNOSIS — R3915 Urgency of urination: Secondary | ICD-10-CM | POA: Diagnosis not present

## 2022-12-19 DIAGNOSIS — N401 Enlarged prostate with lower urinary tract symptoms: Secondary | ICD-10-CM | POA: Diagnosis not present

## 2022-12-19 DIAGNOSIS — R351 Nocturia: Secondary | ICD-10-CM | POA: Diagnosis not present

## 2022-12-19 DIAGNOSIS — R350 Frequency of micturition: Secondary | ICD-10-CM | POA: Diagnosis not present

## 2022-12-20 DIAGNOSIS — R350 Frequency of micturition: Secondary | ICD-10-CM | POA: Diagnosis not present

## 2022-12-21 ENCOUNTER — Telehealth: Payer: Self-pay | Admitting: Cardiology

## 2022-12-21 NOTE — Telephone Encounter (Signed)
   Pre-operative Risk Assessment    Patient Name: [name] [name]  DOB: 12/13/1955 MRN: [id number]      Request for Surgical Clearance    Procedure:   Left ureteroscopy   Date of Surgery:  Clearance 01/09/23                                 Surgeon:  Dr. Alvester Morin  Surgeon's Group or Practice Name:  [facility]  Phone number:  [phone]  Fax number:  [phone]   Type of Clearance Requested:   - Medical  - Pharmacy:  Hold Apixaban (Eliquis)     Type of Anesthesia:  General    Additional requests/questions:    [name]   12/21/2022, 1:20 PM

## 2022-12-21 NOTE — Telephone Encounter (Signed)
     Primary Cardiologist: Olga Millers, MD  Chart reviewed as part of pre-operative protocol coverage. Given past medical history and time since last visit, based on ACC/AHA guidelines, Kearney Woodridge would be at acceptable risk for the planned procedure without further cardiovascular testing.   His RCRI is a class II risk, 0.9% risk of major cardiac event.  Patient with diagnosis of atrial fibrillation on Eliquis for anticoagulation.     Procedure:   Left ureteroscopy    Date of Surgery:  Clearance 01/09/23      CHA2DS2-VASc Score = 3   This indicates a 3.2% annual risk of stroke. The patient's score is based upon: CHF History: 0 HTN History: 1 Diabetes History: 0 Stroke History: 2 Vascular Disease History: 0 (very low CAC score) Age Score: 0 Gender Score: 0   Per chart, CVA was acute ischemic stroke 09/2018   CrCl 86 Platelet count 310     Per office protocol, patient can hold Eliquis for 2 days prior to procedure.   Patient will not need bridging with Lovenox (enoxaparin) around procedure.    I will route this recommendation to the requesting party via Epic fax function and remove from pre-op pool.  Please call with questions.  Thomasene Ripple. Mekhi Lascola NP-C     12/21/2022, 3:28 PM [facility] [address] Office [phone] Fax [phone]

## 2022-12-21 NOTE — Telephone Encounter (Signed)
Patient with diagnosis of atrial fibrillation on Eliquis for anticoagulation.    Procedure:   Left ureteroscopy    Date of Surgery:  Clearance 01/09/23    CHA2DS2-VASc Score = 3   This indicates a 3.2% annual risk of stroke. The patient's score is based upon: CHF History: 0 HTN History: 1 Diabetes History: 0 Stroke History: 2 Vascular Disease History: 0 (very low CAC score) Age Score: 0 Gender Score: 0   Per chart, CVA was acute ischemic stroke 09/2018  CrCl 86 Platelet count 310   Per office protocol, patient can hold Eliquis for 2 days prior to procedure.   Patient will not need bridging with Lovenox (enoxaparin) around procedure.  **This guidance is not considered finalized until pre-operative APP has relayed final recommendations.**

## 2022-12-25 ENCOUNTER — Other Ambulatory Visit (HOSPITAL_COMMUNITY): Payer: Self-pay

## 2022-12-27 ENCOUNTER — Encounter: Payer: Self-pay | Admitting: Family

## 2022-12-28 ENCOUNTER — Other Ambulatory Visit (HOSPITAL_COMMUNITY): Payer: Self-pay

## 2023-01-02 ENCOUNTER — Encounter: Payer: Self-pay | Admitting: Family

## 2023-01-02 ENCOUNTER — Other Ambulatory Visit: Payer: Self-pay

## 2023-01-02 ENCOUNTER — Inpatient Hospital Stay: Payer: Medicare Other | Admitting: Hematology & Oncology

## 2023-01-02 ENCOUNTER — Inpatient Hospital Stay: Payer: Medicare Other | Attending: Hematology & Oncology

## 2023-01-02 ENCOUNTER — Encounter: Payer: Self-pay | Admitting: Hematology & Oncology

## 2023-01-02 VITALS — BP 105/57 | HR 62 | Temp 99.1°F | Resp 18 | Ht 71.0 in | Wt 211.0 lb

## 2023-01-02 DIAGNOSIS — Z7901 Long term (current) use of anticoagulants: Secondary | ICD-10-CM | POA: Insufficient documentation

## 2023-01-02 DIAGNOSIS — D500 Iron deficiency anemia secondary to blood loss (chronic): Secondary | ICD-10-CM | POA: Diagnosis not present

## 2023-01-02 DIAGNOSIS — D751 Secondary polycythemia: Secondary | ICD-10-CM

## 2023-01-02 DIAGNOSIS — D45 Polycythemia vera: Secondary | ICD-10-CM | POA: Diagnosis not present

## 2023-01-02 DIAGNOSIS — N400 Enlarged prostate without lower urinary tract symptoms: Secondary | ICD-10-CM | POA: Insufficient documentation

## 2023-01-02 DIAGNOSIS — E611 Iron deficiency: Secondary | ICD-10-CM | POA: Insufficient documentation

## 2023-01-02 LAB — CMP (CANCER CENTER ONLY)
ALT: 14 U/L (ref 0–44)
AST: 16 U/L (ref 15–41)
Albumin: 4.2 g/dL (ref 3.5–5.0)
Alkaline Phosphatase: 72 U/L (ref 38–126)
Anion gap: 8 (ref 5–15)
BUN: 28 mg/dL — ABNORMAL HIGH (ref 8–23)
CO2: 27 mmol/L (ref 22–32)
Calcium: 9 mg/dL (ref 8.9–10.3)
Chloride: 105 mmol/L (ref 98–111)
Creatinine: 2.06 mg/dL — ABNORMAL HIGH (ref 0.61–1.24)
GFR, Estimated: 35 mL/min — ABNORMAL LOW (ref >60)
Glucose, Bld: 104 mg/dL — ABNORMAL HIGH (ref 70–99)
Potassium: 4.7 mmol/L (ref 3.5–5.1)
Sodium: 140 mmol/L (ref 135–145)
Total Bilirubin: 0.6 mg/dL (ref 0.3–1.2)
Total Protein: 6.4 g/dL — ABNORMAL LOW (ref 6.5–8.1)

## 2023-01-02 LAB — CBC WITH DIFFERENTIAL (CANCER CENTER ONLY)
Abs Immature Granulocytes: 0.11 10*3/uL — ABNORMAL HIGH (ref 0.00–0.07)
Basophils Absolute: 0.1 10*3/uL (ref 0.0–0.1)
Basophils Relative: 1 %
Eosinophils Absolute: 0.3 10*3/uL (ref 0.0–0.5)
Eosinophils Relative: 3 %
HCT: 29.3 % — ABNORMAL LOW (ref 39.0–52.0)
Hemoglobin: 9.4 g/dL — ABNORMAL LOW (ref 13.0–17.0)
Immature Granulocytes: 1 %
Lymphocytes Relative: 8 %
Lymphs Abs: 0.8 10*3/uL (ref 0.7–4.0)
MCH: 27.9 pg (ref 26.0–34.0)
MCHC: 32.1 g/dL (ref 30.0–36.0)
MCV: 86.9 fL (ref 80.0–100.0)
Monocytes Absolute: 0.7 10*3/uL (ref 0.1–1.0)
Monocytes Relative: 7 %
Neutro Abs: 7.7 10*3/uL (ref 1.7–7.7)
Neutrophils Relative %: 80 %
Platelet Count: 299 10*3/uL (ref 150–400)
RBC: 3.37 MIL/uL — ABNORMAL LOW (ref 4.22–5.81)
RDW: 19 % — ABNORMAL HIGH (ref 11.5–15.5)
WBC Count: 9.8 10*3/uL (ref 4.0–10.5)
nRBC: 0.4 % — ABNORMAL HIGH (ref 0.0–0.2)

## 2023-01-02 LAB — LACTATE DEHYDROGENASE: LDH: 415 U/L — ABNORMAL HIGH (ref 98–192)

## 2023-01-02 NOTE — Progress Notes (Signed)
Hematology and Oncology Follow Up Visit  [name] [name] [id number] Oct 09, 1955 67 y.o. 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  He did go out to [place] to visit family.  Unfortunately, he had a flareup of his kidney stone.  This really derailed his medication a little bit.  Is going to have surgery to remove the kidney stone.  This will be him on 01/09/2023.  Otherwise, he seems to be managing.  He continues on Jakafi.  We did do a ultrasound of his spleen back in July.  His splenic surface area was 744 cm.  He has had no problems with the Eliquis.  He has had no bleeding from Eliquis.  He has had no fever.  He did have a little bit of a chills when he had a kidney stone.  He has had no hematuria.  There is been no melena or bright red blood per rectum.  His hemoglobin is down a little bit.  His renal function is down significantly.  I suspect this probably might be from the kidney stone.  Hopefully, after he has this removed, he will be able to hydrate better.  His iron studies that we did back in July showed a ferritin of 93 with an iron saturation of 43%.   Overall, I would say that his performance status is probably ECOG 1.     Medications:  Allergies as of 01/02/2023       Reactions   Trazodone Hcl Other (See Comments)   Other reaction(s): too sleepy in AM   Mirtazapine Other (See Comments)   Other reaction(s): too sleepy in the AM Other Reaction(s): Other (See Comments)    Other reaction(s): too  sleepy in the AM   Niacin Itching, Other (See Comments)   Flushed feeling Other Reaction(s): Other (See Comments)    Flushed feeling        Medication List        Accurate as of January 02, 2023 10:08 AM. If you have any questions, ask your nurse or doctor.          acetaminophen 500 MG tablet Commonly known as: TYLENOL Take 1,000 mg by mouth every 6 (six) hours as needed for mild pain.   amitriptyline 25 MG tablet Commonly known as: ELAVIL TAKE 1 TABLET BY MOUTH EVERYDAY AT BEDTIME   aspirin EC 81 MG tablet Take 1 tablet (81 mg total) by mouth daily.   diltiazem 120 MG 24 hr capsule Commonly known as: CARDIZEM CD Take 1 capsule (120 mg total) by mouth daily.   diltiazem 30 MG tablet Commonly known as: Cardizem Take 1 tablet every 4 hours AS NEEDED for AFIB heart rate >100 as long as top BP >100.   Eliquis 5 MG Tabs tablet Generic drug: apixaban Take 1 tablet (5 mg total) by mouth 2 (two) times daily.   fluorouracil 5 % cream Commonly known as: EFUDEX Apply 1 application  topically 2 (two) times daily as needed (dry skin).   gabapentin 300  MG capsule Commonly known as: NEURONTIN Take 2 capsules (600 mg total) by mouth 2 (two) times daily.   Gemtesa 75 MG Tabs Generic drug: Vibegron Take 1 tablet by mouth daily.   hydrOXYzine 25 MG capsule Commonly known as: VISTARIL 1 capsule 1 hour before bedtime as needed Orally Once a day for 90 days As needed for sleep   Jakafi 10 MG tablet Generic drug: ruxolitinib phosphate TAKE 1 TABLET BY MOUTH 2 TIMES DAILY.   losartan 100 MG tablet Commonly known as: COZAAR Take 1 tablet (100 mg total) by mouth daily.   Melatonin 10 MG Tabs Take 10 mg by mouth at bedtime.   multivitamin with minerals Tabs tablet Take 1 tablet by mouth in the morning.   propranolol ER 160 MG SR capsule Commonly known as: INDERAL LA TAKE 1 CAPSULE BY MOUTH EVERY DAY   rosuvastatin 20 MG tablet Commonly known as: CRESTOR Take 1  tablet (20 mg total) by mouth daily.   tadalafil 5 MG tablet Commonly known as: CIALIS Take 5 mg by mouth every other day. In the morning.   zolpidem 12.5 MG CR tablet Commonly known as: AMBIEN CR Take 6.25 mg by mouth at bedtime as needed for sleep.        Allergies:  Allergies  Allergen Reactions   Trazodone Hcl Other (See Comments)    Other reaction(s): too sleepy in AM   Mirtazapine Other (See Comments)    Other reaction(s): too sleepy in the AM  Other Reaction(s): Other (See Comments)    Other reaction(s): too sleepy in the AM   Niacin Itching and Other (See Comments)    Flushed feeling  Other Reaction(s): Other (See Comments)    Flushed feeling     Past Medical History, Surgical history, Social history, and Family History were reviewed and updated.  Review of Systems: Review of Systems  Constitutional: Negative.   HENT: Negative.    Eyes: Negative.   Respiratory: Negative.    Cardiovascular: Negative.   Gastrointestinal: Negative.   Genitourinary: Negative.   Musculoskeletal: Negative.   Skin:  Positive for rash.  Neurological: Negative.   Endo/Heme/Allergies: Negative.   Psychiatric/Behavioral: Negative.       Physical Exam:  height is 5\' 11"  (1.803 m) and weight is 211 lb (95.7 kg). His oral temperature is 99.1 F (37.3 C). His blood pressure is 105/57 (abnormal) and his pulse is 62. His respiration is 18 and oxygen saturation is 98%.   Wt Readings from Last 3 Encounters:  01/02/23 211 lb (95.7 kg)  11/30/22 215 lb 3.2 oz (97.6 kg)  11/21/22 216 lb (98 kg)    Physical Exam Vitals reviewed.  HENT:     Head: Normocephalic and atraumatic.  Eyes:     Pupils: Pupils are equal, round, and reactive to light.  Cardiovascular:     Rate and Rhythm: Normal rate and regular rhythm.     Heart sounds: Normal heart sounds.  Pulmonary:     Effort: Pulmonary effort is normal.     Breath sounds: Normal breath sounds.  Abdominal:     General: Bowel  sounds are normal.     Palpations: Abdomen is soft.     Comments: I cannot palpate his spleen.  Musculoskeletal:        General: No tenderness or deformity. Normal range of motion.     Cervical back: Normal range of motion.     Comments: He does have an Ace wrapping on the left wrist.  Lymphadenopathy:  Cervical: No cervical adenopathy.  Skin:    General: Skin is warm and dry.     Findings: No erythema or rash.     Comments: He has the healing zoster rash in the left T7 dermatome.  There is some sensitivity to touch in this area.  Neurological:     Mental Status: He is alert and oriented to person, place, and time.  Psychiatric:        Behavior: Behavior normal.        Thought Content: Thought content normal.        Judgment: Judgment normal.      Lab Results  Component Value Date   WBC 9.8 01/02/2023   HGB 9.4 (L) 01/02/2023   HCT 29.3 (L) 01/02/2023   MCV 86.9 01/02/2023   PLT 299 01/02/2023   Lab Results  Component Value Date   FERRITIN 93 11/21/2022   IRON 173 11/21/2022   TIBC 407 11/21/2022   UIBC 234 11/21/2022   IRONPCTSAT 43 (H) 11/21/2022   Lab Results  Component Value Date   RETICCTPCT 3.0 09/14/2022   RBC 3.37 (L) 01/02/2023   RETICCTABS 87.6 02/21/2011   Lab Results  Component Value Date   KPAFRELGTCHN 10.7 09/27/2021   LAMBDASER 8.5 09/27/2021   KAPLAMBRATIO 1.26 09/27/2021   No results found for: "IGGSERUM", "IGA", "IGMSERUM" No results found for: "TOTALPROTELP", "ALBUMINELP", "A1GS", "A2GS", "BETS", "BETA2SER", "GAMS", "MSPIKE", "SPEI"   Chemistry      Component Value Date/Time   NA 140 01/02/2023 0907   NA 144 04/25/2017 1506   NA 139 04/12/2016 1113   K 4.7 01/02/2023 0907   K 4.2 04/25/2017 1506   K 4.1 04/12/2016 1113   CL 105 01/02/2023 0907   CL 105 04/25/2017 1506   CO2 27 01/02/2023 0907   CO2 29 04/25/2017 1506   CO2 22 04/12/2016 1113   BUN 28 (H) 01/02/2023 0907   BUN 17 04/25/2017 1506   BUN 16.3 04/12/2016 1113    CREATININE 2.06 (H) 01/02/2023 0907   CREATININE 1.2 04/25/2017 1506   CREATININE 0.9 04/12/2016 1113      Component Value Date/Time   CALCIUM 9.0 01/02/2023 0907   CALCIUM 9.5 04/25/2017 1506   CALCIUM 9.1 04/12/2016 1113   ALKPHOS 72 01/02/2023 0907   ALKPHOS 73 04/25/2017 1506   ALKPHOS 66 04/12/2016 1113   AST 16 01/02/2023 0907   AST 31 04/12/2016 1113   ALT 14 01/02/2023 0907   ALT 58 (H) 04/25/2017 1506   ALT 36 04/12/2016 1113   BILITOT 0.6 01/02/2023 0907   BILITOT 0.73 04/12/2016 1113       Impression and Plan: [name] [name] is a very pleasant 67 yo caucasian gentleman with polycythemia vera, JAK2 positive.   His platelet count is doing quite nicely right now.  However, his hemoglobin is dropped.  I suspect this probably is from his renal insufficiency.  I would like to hope that the renal insufficiency will improve once he has a kidney stone.  He does have prostate hypertrophy.  He is on Gemtesa for this.  I still would like to get him back in 6 weeks.  I do not think we have to do any scans on him ultrasound.    Josph Macho, MD 8/28/202410:08 AM

## 2023-01-09 DIAGNOSIS — N202 Calculus of kidney with calculus of ureter: Secondary | ICD-10-CM | POA: Diagnosis not present

## 2023-01-16 ENCOUNTER — Other Ambulatory Visit: Payer: Self-pay | Admitting: Cardiology

## 2023-01-16 ENCOUNTER — Other Ambulatory Visit: Payer: Self-pay

## 2023-01-16 ENCOUNTER — Other Ambulatory Visit (HOSPITAL_COMMUNITY): Payer: Self-pay

## 2023-01-16 DIAGNOSIS — I480 Paroxysmal atrial fibrillation: Secondary | ICD-10-CM

## 2023-01-16 DIAGNOSIS — N200 Calculus of kidney: Secondary | ICD-10-CM | POA: Diagnosis not present

## 2023-01-16 MED ORDER — APIXABAN 5 MG PO TABS
5.0000 mg | ORAL_TABLET | Freq: Two times a day (BID) | ORAL | 1 refills | Status: DC
Start: 2023-01-16 — End: 2023-07-10
  Filled 2023-01-16: qty 180, 90d supply, fill #0
  Filled 2023-04-17: qty 180, 90d supply, fill #1

## 2023-01-16 MED ORDER — ROSUVASTATIN CALCIUM 20 MG PO TABS
20.0000 mg | ORAL_TABLET | Freq: Every day | ORAL | 3 refills | Status: DC
  Filled 2023-01-16: qty 90, 90d supply, fill #0
  Filled 2023-04-17: qty 90, 90d supply, fill #1
  Filled 2023-07-10: qty 90, 90d supply, fill #2
  Filled 2023-10-09: qty 90, 90d supply, fill #3

## 2023-01-16 MED ORDER — DILTIAZEM HCL ER COATED BEADS 120 MG PO CP24
120.0000 mg | ORAL_CAPSULE | Freq: Every day | ORAL | 3 refills | Status: DC
Start: 2023-01-16 — End: 2024-01-05
  Filled 2023-01-16: qty 90, 90d supply, fill #0
  Filled 2023-04-17: qty 90, 90d supply, fill #1
  Filled 2023-07-10: qty 90, 90d supply, fill #2
  Filled 2023-10-09: qty 90, 90d supply, fill #3

## 2023-01-16 MED ORDER — LOSARTAN POTASSIUM 100 MG PO TABS
100.0000 mg | ORAL_TABLET | Freq: Every day | ORAL | 3 refills | Status: DC
  Filled 2023-01-16: qty 90, 90d supply, fill #0
  Filled 2023-04-17: qty 90, 90d supply, fill #1
  Filled 2023-07-10: qty 90, 90d supply, fill #2
  Filled 2023-10-09: qty 90, 90d supply, fill #3

## 2023-01-16 NOTE — Telephone Encounter (Signed)
Prescription refill request for Eliquis received. Indication: Afib  Last office visit: 11/30/22 ([place])  Scr: 2.06 (01/02/23)  Age: 67 Weight: 95.7kg  Appropriate dose. Refill sent.

## 2023-01-21 ENCOUNTER — Ambulatory Visit: Payer: Managed Care, Other (non HMO) | Admitting: Cardiology

## 2023-01-21 ENCOUNTER — Other Ambulatory Visit (HOSPITAL_COMMUNITY): Payer: Self-pay

## 2023-02-04 ENCOUNTER — Encounter: Payer: Self-pay | Admitting: Family

## 2023-02-11 ENCOUNTER — Encounter: Payer: Self-pay | Admitting: Hematology & Oncology

## 2023-02-11 ENCOUNTER — Inpatient Hospital Stay (HOSPITAL_BASED_OUTPATIENT_CLINIC_OR_DEPARTMENT_OTHER): Payer: Medicare Other | Admitting: Hematology & Oncology

## 2023-02-11 ENCOUNTER — Inpatient Hospital Stay: Payer: Medicare Other | Attending: Hematology & Oncology

## 2023-02-11 VITALS — BP 126/65 | HR 60 | Temp 98.7°F | Resp 17 | Wt 210.0 lb

## 2023-02-11 DIAGNOSIS — Z7901 Long term (current) use of anticoagulants: Secondary | ICD-10-CM | POA: Insufficient documentation

## 2023-02-11 DIAGNOSIS — E611 Iron deficiency: Secondary | ICD-10-CM | POA: Insufficient documentation

## 2023-02-11 DIAGNOSIS — D751 Secondary polycythemia: Secondary | ICD-10-CM | POA: Diagnosis not present

## 2023-02-11 DIAGNOSIS — D45 Polycythemia vera: Secondary | ICD-10-CM | POA: Diagnosis not present

## 2023-02-11 DIAGNOSIS — D500 Iron deficiency anemia secondary to blood loss (chronic): Secondary | ICD-10-CM

## 2023-02-11 LAB — CBC WITH DIFFERENTIAL (CANCER CENTER ONLY)
Abs Immature Granulocytes: 0.15 10*3/uL — ABNORMAL HIGH (ref 0.00–0.07)
Basophils Absolute: 0.1 10*3/uL (ref 0.0–0.1)
Basophils Relative: 2 %
Eosinophils Absolute: 0.5 10*3/uL (ref 0.0–0.5)
Eosinophils Relative: 7 %
HCT: 31.3 % — ABNORMAL LOW (ref 39.0–52.0)
Hemoglobin: 10.3 g/dL — ABNORMAL LOW (ref 13.0–17.0)
Immature Granulocytes: 2 %
Lymphocytes Relative: 17 %
Lymphs Abs: 1.2 10*3/uL (ref 0.7–4.0)
MCH: 27.9 pg (ref 26.0–34.0)
MCHC: 32.9 g/dL (ref 30.0–36.0)
MCV: 84.8 fL (ref 80.0–100.0)
Monocytes Absolute: 0.5 10*3/uL (ref 0.1–1.0)
Monocytes Relative: 8 %
Neutro Abs: 4.5 10*3/uL (ref 1.7–7.7)
Neutrophils Relative %: 64 %
Platelet Count: 398 10*3/uL (ref 150–400)
RBC: 3.69 MIL/uL — ABNORMAL LOW (ref 4.22–5.81)
RDW: 20.6 % — ABNORMAL HIGH (ref 11.5–15.5)
WBC Count: 6.9 10*3/uL (ref 4.0–10.5)
nRBC: 1.6 % — ABNORMAL HIGH (ref 0.0–0.2)

## 2023-02-11 LAB — SAVE SMEAR(SSMR), FOR PROVIDER SLIDE REVIEW

## 2023-02-11 LAB — RETICULOCYTES
Immature Retic Fract: 25.2 % — ABNORMAL HIGH (ref 2.3–15.9)
RBC.: 3.63 MIL/uL — ABNORMAL LOW (ref 4.22–5.81)
Retic Count, Absolute: 128.9 10*3/uL (ref 19.0–186.0)
Retic Ct Pct: 3.6 % — ABNORMAL HIGH (ref 0.4–3.1)

## 2023-02-11 LAB — CMP (CANCER CENTER ONLY)
ALT: 17 U/L (ref 0–44)
AST: 26 U/L (ref 15–41)
Albumin: 4.5 g/dL (ref 3.5–5.0)
Alkaline Phosphatase: 50 U/L (ref 38–126)
Anion gap: 8 (ref 5–15)
BUN: 19 mg/dL (ref 8–23)
CO2: 26 mmol/L (ref 22–32)
Calcium: 9.2 mg/dL (ref 8.9–10.3)
Chloride: 107 mmol/L (ref 98–111)
Creatinine: 1.26 mg/dL — ABNORMAL HIGH (ref 0.61–1.24)
GFR, Estimated: >60 mL/min (ref >60)
Glucose, Bld: 93 mg/dL (ref 70–99)
Potassium: 3.9 mmol/L (ref 3.5–5.1)
Sodium: 141 mmol/L (ref 135–145)
Total Bilirubin: 0.8 mg/dL (ref 0.3–1.2)
Total Protein: 6.7 g/dL (ref 6.5–8.1)

## 2023-02-11 LAB — FERRITIN: Ferritin: 108 ng/mL (ref 24–336)

## 2023-02-11 LAB — LACTATE DEHYDROGENASE: LDH: 404 U/L — ABNORMAL HIGH (ref 98–192)
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
## 2023-02-12 LAB — IRON AND IRON BINDING CAPACITY (CC-WL,HP ONLY)
Iron: 163 ug/dL (ref 45–182)
Saturation Ratios: 40 % — ABNORMAL HIGH (ref 17.9–39.5)
TIBC: 405 ug/dL (ref 250–450)
UIBC: 242 ug/dL (ref 117–376)

## 2023-02-12 LAB — ERYTHROPOIETIN: Erythropoietin: 22.4 m[IU]/mL — ABNORMAL HIGH (ref 2.6–18.5)

## 2023-02-22 DIAGNOSIS — R3914 Feeling of incomplete bladder emptying: Secondary | ICD-10-CM | POA: Diagnosis not present

## 2023-02-22 DIAGNOSIS — R311 Benign essential microscopic hematuria: Secondary | ICD-10-CM | POA: Diagnosis not present

## 2023-02-22 DIAGNOSIS — N202 Calculus of kidney with calculus of ureter: Secondary | ICD-10-CM | POA: Diagnosis not present

## 2023-02-22 DIAGNOSIS — N130 Hydronephrosis with ureteropelvic junction obstruction: Secondary | ICD-10-CM | POA: Diagnosis not present

## 2023-02-25 DIAGNOSIS — M25522 Pain in left elbow: Secondary | ICD-10-CM | POA: Diagnosis not present

## 2023-02-25 DIAGNOSIS — M25561 Pain in right knee: Secondary | ICD-10-CM | POA: Diagnosis not present

## 2023-02-25 DIAGNOSIS — M25511 Pain in right shoulder: Secondary | ICD-10-CM | POA: Diagnosis not present

## 2023-02-27 ENCOUNTER — Other Ambulatory Visit: Payer: Self-pay

## 2023-02-27 NOTE — Progress Notes (Signed)
Specialty Pharmacy Refill Coordination Note  [name] [name] is a 67 y.o. male contacted today regarding refills of specialty medication(s) Ruxolitinib Phosphate (Antineoplastic Enzyme Inhibitors)   Patient requested Delivery   Delivery date: 03/08/23   Verified address: [address] [place] [address]   Medication will be filled on 03/07/23.

## 2023-03-25 DIAGNOSIS — M25522 Pain in left elbow: Secondary | ICD-10-CM | POA: Diagnosis not present

## 2023-03-29 ENCOUNTER — Other Ambulatory Visit: Payer: Self-pay

## 2023-04-01 ENCOUNTER — Other Ambulatory Visit: Payer: Self-pay

## 2023-04-01 MED ORDER — AMITRIPTYLINE HCL 25 MG PO TABS: ORAL_TABLET | ORAL | 0 refills | Status: DC

## 2023-04-01 NOTE — Progress Notes (Signed)
Specialty Pharmacy Refill Coordination Note  [name] [name] is a 67 y.o. male contacted today regarding refills of specialty medication(s) Ruxolitinib Phosphate (Antineoplastic Enzyme Inhibitors)   Patient requested Delivery   Delivery date: 04/03/23   Verified address: [address] [facility] [place] [address]   Medication will be filled on 04/02/23.

## 2023-04-02 ENCOUNTER — Other Ambulatory Visit: Payer: Self-pay

## 2023-04-03 ENCOUNTER — Other Ambulatory Visit: Payer: Self-pay

## 2023-04-03 DIAGNOSIS — M19211 Secondary osteoarthritis, right shoulder: Secondary | ICD-10-CM | POA: Diagnosis not present

## 2023-04-03 DIAGNOSIS — G629 Polyneuropathy, unspecified: Secondary | ICD-10-CM

## 2023-04-03 MED ORDER — GABAPENTIN 300 MG PO CAPS: 600.0000 mg | ORAL_CAPSULE | Freq: Two times a day (BID) | ORAL | 0 refills | Status: DC

## 2023-04-04 ENCOUNTER — Encounter: Payer: Self-pay | Admitting: Family

## 2023-04-08 ENCOUNTER — Other Ambulatory Visit: Payer: Self-pay | Admitting: Orthopedic Surgery

## 2023-04-08 DIAGNOSIS — M19011 Primary osteoarthritis, right shoulder: Secondary | ICD-10-CM

## 2023-04-11 ENCOUNTER — Encounter: Payer: Self-pay | Admitting: Adult Health

## 2023-04-11 ENCOUNTER — Ambulatory Visit: Payer: Medicare Other | Admitting: Adult Health

## 2023-04-11 ENCOUNTER — Encounter: Payer: Self-pay | Admitting: Family

## 2023-04-11 VITALS — BP 94/63 | HR 61 | Ht 71.0 in | Wt 215.0 lb

## 2023-04-11 DIAGNOSIS — Z8673 Personal history of transient ischemic attack (TIA), and cerebral infarction without residual deficits: Secondary | ICD-10-CM | POA: Diagnosis not present

## 2023-04-11 DIAGNOSIS — G44221 Chronic tension-type headache, intractable: Secondary | ICD-10-CM | POA: Diagnosis not present

## 2023-04-11 DIAGNOSIS — G629 Polyneuropathy, unspecified: Secondary | ICD-10-CM

## 2023-04-11 DIAGNOSIS — G4730 Sleep apnea, unspecified: Secondary | ICD-10-CM

## 2023-04-11 DIAGNOSIS — G5603 Carpal tunnel syndrome, bilateral upper limbs: Secondary | ICD-10-CM

## 2023-04-11 MED ORDER — AMITRIPTYLINE HCL 25 MG PO TABS: 50.0000 mg | ORAL_TABLET | Freq: Every day | ORAL | 3 refills | Status: DC

## 2023-04-11 NOTE — Patient Instructions (Addendum)
Your Plan:  Increase amitriptyline to 50mg  nightly   Continue gabapentin 600mg  twice daily - can consider increasing night time dose in the future if needed  Please keep me updated regarding neuropathy - if this continues to worsen, please let me know and we can consider repeat imaging or repeat EMG/NCV   Please let me know if you are interested in participating in speech therapy for continued speech difficulties  Continue stroke prevention medications and routine follow-up with PCP and cardiology      Follow up in 6 months or call earlier if needed      Thank you for coming to see [place] at [facility]. I hope we have been able to provide you high quality care today.  You may receive a patient satisfaction survey over the next few weeks. We would appreciate your feedback and comments so that we may continue to improve ourselves and the health of our patients.

## 2023-04-11 NOTE — Progress Notes (Signed)
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 polycythemia, HTN, HLD, atrial fibrillation and prior stroke.  Stable from stroke standpoint with residual expressive aphasia    PLAN:  L precentral gyrus  stroke:  Continued fluctuation of expressive aphasia, discussed participating in SLP but declines interest at this time, he will call if interested in pursuing in the future Continue aspirin 81 mg daily and Eliquis (apixaban) daily  and lipitor 40mg   for secondary stroke prevention.  Discussed secondary stroke prevention measures and importance of close PCP follow-up for aggressive stroke risk factor management including BP goal<130/90, and HLD with LDL goal<70    Mild OSA: HST 07/2019 mild OSA.  Continue use of dental device.  Followed by dentistry.  Small fiber neuropathy:  Gait impairment Notes some worsening  Recommend trying to increase amitriptyline to 50 mg nightly (which can also benefit headaches) Continue gabapentin 600 mg BID but can consider increasing nighttime dose in the future if needed Advised if symptoms continue to worsen, can consider repeat MRI L-spine and/or  EMG/NCV EMG/NCV 10/13/2019 BUE and RLE unremarkable Neuropathy panel unremarkable.  MR cervical 03/2021 possible C5-C6 moderate right foraminal narrowing possibly affecting right C6 nerve root MR lumbar 03/2021 L2-3 moderate left lateral recess stenosis, L3-4 moderate lateral recess stenosis , L>R, impingement upon transversing L4 nerve root but no definite nerve root compression, L4-5 moderate left lateral recess stenosis and moderately severe right lateral recess stenosis, potential for right L5 nerve root compression, L5-S1 moderate bilateral foraminal narrowing and moderate bilateral lateral recess stenosis Intolerant to topiramate  Chronic headaches:  approx 2-3/week.  Increase amitriptyline to 50 mg nightly as noted above Prior intolerance to topiramate MR brain negative.  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  This included previsit chart review, lab review, study review, order entry, electronic health record documentation, patient education and discussion regarding above diagnoses and treatment plan and answered all the questions to patient's satisfaction  Ihor Austin, [facility]  [facility] [address] [place], [place] [address]  Phone [phone] Fax [phone] Note: This document was prepared with digital dictation and possible smart phrase technology. Any transcriptional errors that result from this process are unintentional.

## 2023-04-15 DIAGNOSIS — M25511 Pain in right shoulder: Secondary | ICD-10-CM | POA: Diagnosis not present

## 2023-04-17 ENCOUNTER — Other Ambulatory Visit (HOSPITAL_COMMUNITY): Payer: Self-pay

## 2023-04-18 ENCOUNTER — Other Ambulatory Visit: Payer: Self-pay

## 2023-04-22 ENCOUNTER — Other Ambulatory Visit: Payer: Self-pay

## 2023-04-22 ENCOUNTER — Ambulatory Visit (HOSPITAL_BASED_OUTPATIENT_CLINIC_OR_DEPARTMENT_OTHER)
Admission: RE | Admit: 2023-04-22 | Discharge: 2023-04-22 | Disposition: A | Discharge status: Home / Self Care | Payer: Medicare Other | Source: Ambulatory Visit | Attending: Hematology & Oncology | Admitting: Hematology & Oncology

## 2023-04-22 ENCOUNTER — Inpatient Hospital Stay: Payer: Medicare Other | Attending: Hematology & Oncology

## 2023-04-22 ENCOUNTER — Inpatient Hospital Stay (HOSPITAL_BASED_OUTPATIENT_CLINIC_OR_DEPARTMENT_OTHER): Payer: Medicare Other | Admitting: Hematology & Oncology

## 2023-04-22 ENCOUNTER — Inpatient Hospital Stay: Payer: Medicare Other

## 2023-04-22 ENCOUNTER — Encounter: Payer: Self-pay | Admitting: Hematology & Oncology

## 2023-04-22 VITALS — BP 123/59 | HR 60 | Temp 98.1°F | Resp 16 | Ht 71.0 in | Wt 216.0 lb

## 2023-04-22 DIAGNOSIS — D45 Polycythemia vera: Secondary | ICD-10-CM | POA: Diagnosis not present

## 2023-04-22 DIAGNOSIS — E611 Iron deficiency: Secondary | ICD-10-CM | POA: Diagnosis not present

## 2023-04-22 DIAGNOSIS — D751 Secondary polycythemia: Secondary | ICD-10-CM

## 2023-04-22 DIAGNOSIS — R161 Splenomegaly, not elsewhere classified: Secondary | ICD-10-CM | POA: Diagnosis not present

## 2023-04-22 LAB — RETICULOCYTES
Immature Retic Fract: 23.8 % — ABNORMAL HIGH (ref 2.3–15.9)
RBC.: 4.02 MIL/uL — ABNORMAL LOW (ref 4.22–5.81)
Retic Count, Absolute: 115.4 10*3/uL (ref 19.0–186.0)
Retic Ct Pct: 2.9 % (ref 0.4–3.1)

## 2023-04-22 LAB — SAVE SMEAR(SSMR), FOR PROVIDER SLIDE REVIEW

## 2023-04-22 LAB — CMP (CANCER CENTER ONLY)
ALT: 21 U/L (ref 0–44)
AST: 26 U/L (ref 15–41)
Albumin: 4.5 g/dL (ref 3.5–5.0)
Alkaline Phosphatase: 39 U/L (ref 38–126)
Anion gap: 7 (ref 5–15)
BUN: 15 mg/dL (ref 8–23)
CO2: 29 mmol/L (ref 22–32)
Calcium: 9.3 mg/dL (ref 8.9–10.3)
Chloride: 106 mmol/L (ref 98–111)
Creatinine: 1.21 mg/dL (ref 0.61–1.24)
GFR, Estimated: >60 mL/min (ref >60)
Glucose, Bld: 112 mg/dL — ABNORMAL HIGH (ref 70–99)
Potassium: 4 mmol/L (ref 3.5–5.1)
Sodium: 142 mmol/L (ref 135–145)
Total Bilirubin: 0.8 mg/dL (ref <1.2)
Total Protein: 6.4 g/dL — ABNORMAL LOW (ref 6.5–8.1)

## 2023-04-22 LAB — CBC WITH DIFFERENTIAL (CANCER CENTER ONLY)
Abs Immature Granulocytes: 0.13 10*3/uL — ABNORMAL HIGH (ref 0.00–0.07)
Basophils Absolute: 0.1 10*3/uL (ref 0.0–0.1)
Basophils Relative: 2 %
Eosinophils Absolute: 0.3 10*3/uL (ref 0.0–0.5)
Eosinophils Relative: 5 %
HCT: 34.6 % — ABNORMAL LOW (ref 39.0–52.0)
Hemoglobin: 11.2 g/dL — ABNORMAL LOW (ref 13.0–17.0)
Immature Granulocytes: 2 %
Lymphocytes Relative: 15 %
Lymphs Abs: 0.9 10*3/uL (ref 0.7–4.0)
MCH: 27.7 pg (ref 26.0–34.0)
MCHC: 32.4 g/dL (ref 30.0–36.0)
MCV: 85.6 fL (ref 80.0–100.0)
Monocytes Absolute: 0.5 10*3/uL (ref 0.1–1.0)
Monocytes Relative: 8 %
Neutro Abs: 4.3 10*3/uL (ref 1.7–7.7)
Neutrophils Relative %: 68 %
Platelet Count: 341 10*3/uL (ref 150–400)
RBC: 4.04 MIL/uL — ABNORMAL LOW (ref 4.22–5.81)
RDW: 18.6 % — ABNORMAL HIGH (ref 11.5–15.5)
WBC Count: 6.3 10*3/uL (ref 4.0–10.5)
nRBC: 0.6 % — ABNORMAL HIGH (ref 0.0–0.2)

## 2023-04-22 LAB — IRON AND IRON BINDING CAPACITY (CC-WL,HP ONLY)
Iron: 141 ug/dL (ref 45–182)
Saturation Ratios: 36 % (ref 17.9–39.5)
TIBC: 392 ug/dL (ref 250–450)
UIBC: 251 ug/dL (ref 117–376)

## 2023-04-22 LAB — LACTATE DEHYDROGENASE: LDH: 414 U/L — ABNORMAL HIGH (ref 98–192)

## 2023-04-22 LAB — FERRITIN: Ferritin: 58 ng/mL (ref 24–336)

## 2023-04-22 NOTE — Progress Notes (Signed)
Hematology and Oncology Follow Up Visit  Dashaun Daskal [id number] 1956-01-23 67 y.o. 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 Tillis is here today for follow-up.  He is doing pretty well.  He recover from his kidney stone surgery.  I think had this back in September.  A nice and quiet Thanksgiving.  He will have a busier Christmas as his family will be coming in from the [place].  We did do ultrasound of spleen today.  His splenic volume is loss.  The splenic volume is 662 cubic centimeters.  Prior, it was 744 cm.  He has had no problems with fever.  He has had no rashes.  He has had no leg swelling.  He has had no change in bowel or bladder habits  There is been no cough or shortness of breath.  He has had no bleeding.  Overall, I would say performance status about ECOG 0.    Medications:  Allergies as of 04/22/2023       Reactions   Trazodone Hcl Other (See Comments)   Other reaction(s): too sleepy in AM   Mirtazapine Other (See Comments)   Other reaction(s): too sleepy in the AM Other Reaction(s): Other (See Comments)    Other reaction(s): too sleepy in the AM   Niacin Itching, Other (See Comments)   Flushed feeling Other Reaction(s): Other (See Comments)    Flushed feeling        Medication List        Accurate as of April 22, 2023  9:53 AM. If you have any questions, ask your nurse or doctor.          acetaminophen 500 MG tablet Commonly known as: TYLENOL Take 1,000 mg by mouth every 6 (six) hours as needed for mild  pain.   amitriptyline 25 MG tablet Commonly known as: ELAVIL Take 2 tablets (50 mg total) by mouth at bedtime. TAKE 1 TABLET BY MOUTH EVERYDAY AT BEDTIME   aspirin EC 81 MG tablet Take 1 tablet (81 mg total) by mouth daily.   diltiazem 120 MG 24 hr capsule Commonly known as: CARDIZEM CD Take 1 capsule (120 mg total) by mouth daily.   diltiazem 30 MG tablet Commonly known as: Cardizem Take 1 tablet every 4 hours AS NEEDED for AFIB heart rate >100 as long as top BP >100.   Eliquis 5 MG Tabs tablet Generic drug: apixaban Take 1 tablet (5 mg total) by mouth 2 (two) times daily.   fluorouracil 5 % cream Commonly known as: EFUDEX Apply 1 application  topically 2 (two) times daily as needed (dry skin).   gabapentin 300 MG capsule Commonly known as: NEURONTIN Take 2 capsules (600 mg total) by mouth 2 (two) times daily.   Gemtesa 75 MG Tabs Generic drug: Vibegron Take 1 tablet by mouth daily.   hydrOXYzine 25 MG capsule Commonly known as: VISTARIL 1 capsule 1 hour before bedtime as needed Orally Once a day for 90 days As needed for sleep   Jakafi 10 MG tablet Generic drug:  ruxolitinib phosphate TAKE 1 TABLET BY MOUTH 2 TIMES DAILY.   losartan 100 MG tablet Commonly known as: COZAAR Take 1 tablet (100 mg total) by mouth daily.   Melatonin 10 MG Tabs Take 10 mg by mouth at bedtime.   multivitamin with minerals Tabs tablet Take 1 tablet by mouth in the morning.   propranolol ER 160 MG SR capsule Commonly known as: INDERAL LA TAKE 1 CAPSULE BY MOUTH EVERY DAY   rosuvastatin 20 MG tablet Commonly known as: CRESTOR Take 1 tablet (20 mg total) by mouth daily.   tadalafil 5 MG tablet Commonly known as: CIALIS Take 5 mg by mouth every other day. In the morning.   zolpidem 12.5 MG CR tablet Commonly known as: AMBIEN CR Take 6.25 mg by mouth at bedtime as needed for sleep.        Allergies:  Allergies  Allergen Reactions   Trazodone Hcl Other (See Comments)     Other reaction(s): too sleepy in AM   Mirtazapine Other (See Comments)    Other reaction(s): too sleepy in the AM  Other Reaction(s): Other (See Comments)    Other reaction(s): too sleepy in the AM   Niacin Itching and Other (See Comments)    Flushed feeling  Other Reaction(s): Other (See Comments)    Flushed feeling     Past Medical History, Surgical history, Social history, and Family History were reviewed and updated.  Review of Systems: Review of Systems  Constitutional: Negative.   HENT: Negative.    Eyes: Negative.   Respiratory: Negative.    Cardiovascular: Negative.   Gastrointestinal: Negative.   Genitourinary: Negative.   Musculoskeletal: Negative.   Skin:  Positive for rash.  Neurological: Negative.   Endo/Heme/Allergies: Negative.   Psychiatric/Behavioral: Negative.       Physical Exam:  height is 5\' 11"  (1.803 m) and weight is 216 lb (98 kg). His oral temperature is 98.1 F (36.7 C). His blood pressure is 123/59 (abnormal) and his pulse is 60. His respiration is 16 and oxygen saturation is 100%.   Wt Readings from Last 3 Encounters:  04/22/23 216 lb (98 kg)  04/11/23 215 lb (97.5 kg)  02/11/23 210 lb 0.6 oz (95.3 kg)    Physical Exam Vitals reviewed.  HENT:     Head: Normocephalic and atraumatic.  Eyes:     Pupils: Pupils are equal, round, and reactive to light.  Cardiovascular:     Rate and Rhythm: Normal rate and regular rhythm.     Heart sounds: Normal heart sounds.  Pulmonary:     Effort: Pulmonary effort is normal.     Breath sounds: Normal breath sounds.  Abdominal:     General: Bowel sounds are normal.     Palpations: Abdomen is soft.     Comments: I cannot palpate his spleen.  Musculoskeletal:        General: No tenderness or deformity. Normal range of motion.     Cervical back: Normal range of motion.     Comments: He does have an Ace wrapping on the left wrist.  Lymphadenopathy:     Cervical: No cervical adenopathy.  Skin:     General: Skin is warm and dry.     Findings: No erythema or rash.     Comments: He has the healing zoster rash in the left T7 dermatome.  There is some sensitivity to touch in this area.  Neurological:     Mental Status: He is alert and oriented to person, place,  and time.  Psychiatric:        Behavior: Behavior normal.        Thought Content: Thought content normal.        Judgment: Judgment normal.      Lab Results  Component Value Date   WBC 6.3 04/22/2023   HGB 11.2 (L) 04/22/2023   HCT 34.6 (L) 04/22/2023   MCV 85.6 04/22/2023   PLT 341 04/22/2023   Lab Results  Component Value Date   FERRITIN 108 02/11/2023   IRON 163 02/11/2023   TIBC 405 02/11/2023   UIBC 242 02/11/2023   IRONPCTSAT 40 (H) 02/11/2023   Lab Results  Component Value Date   RETICCTPCT 2.9 04/22/2023   RBC 4.02 (L) 04/22/2023   RETICCTABS 87.6 02/21/2011   Lab Results  Component Value Date   KPAFRELGTCHN 10.7 09/27/2021   LAMBDASER 8.5 09/27/2021   KAPLAMBRATIO 1.26 09/27/2021   No results found for: "IGGSERUM", "IGA", "IGMSERUM" No results found for: "TOTALPROTELP", "ALBUMINELP", "A1GS", "A2GS", "BETS", "BETA2SER", "GAMS", "MSPIKE", "SPEI"   Chemistry      Component Value Date/Time   NA 142 04/22/2023 0839   NA 144 04/25/2017 1506   NA 139 04/12/2016 1113   K 4.0 04/22/2023 0839   K 4.2 04/25/2017 1506   K 4.1 04/12/2016 1113   CL 106 04/22/2023 0839   CL 105 04/25/2017 1506   CO2 29 04/22/2023 0839   CO2 29 04/25/2017 1506   CO2 22 04/12/2016 1113   BUN 15 04/22/2023 0839   BUN 17 04/25/2017 1506   BUN 16.3 04/12/2016 1113   CREATININE 1.21 04/22/2023 0839   CREATININE 1.2 04/25/2017 1506   CREATININE 0.9 04/12/2016 1113      Component Value Date/Time   CALCIUM 9.3 04/22/2023 0839   CALCIUM 9.5 04/25/2017 1506   CALCIUM 9.1 04/12/2016 1113   ALKPHOS 39 04/22/2023 0839   ALKPHOS 73 04/25/2017 1506   ALKPHOS 66 04/12/2016 1113   AST 26 04/22/2023 0839   AST 31 04/12/2016  1113   ALT 21 04/22/2023 0839   ALT 58 (H) 04/25/2017 1506   ALT 36 04/12/2016 1113   BILITOT 0.8 04/22/2023 0839   BILITOT 0.73 04/12/2016 1113       Impression and Plan: Mr. Weitkamp is a very pleasant 67 yo caucasian gentleman with polycythemia vera, JAK2 positive.  Everything looks quite good right now.  His platelet count is lower.  His splenic volume is lower.  He is doing well on the [place].  Will plan to get him back in 3 months now.  I will make sure that he is going be okay when he goes to [place] in May for the [employer]. Marland Kitchen Josph Macho, MD 12/16/20249:53 AM

## 2023-04-23 ENCOUNTER — Other Ambulatory Visit (HOSPITAL_COMMUNITY): Payer: Self-pay

## 2023-04-23 ENCOUNTER — Other Ambulatory Visit: Payer: Self-pay | Admitting: Hematology & Oncology

## 2023-04-23 ENCOUNTER — Other Ambulatory Visit (HOSPITAL_COMMUNITY): Payer: Self-pay | Admitting: Pharmacy Technician

## 2023-04-23 DIAGNOSIS — D751 Secondary polycythemia: Secondary | ICD-10-CM

## 2023-04-23 MED ORDER — RUXOLITINIB PHOSPHATE 10 MG PO TABS
ORAL_TABLET | Freq: Two times a day (BID) | ORAL | 4 refills | Status: DC
  Filled 2023-04-24: qty 60, 30d supply, fill #0
  Filled 2023-05-29: qty 60, 30d supply, fill #1
  Filled 2023-07-01 (×2): qty 60, 30d supply, fill #2
  Filled 2023-07-25: qty 60, 30d supply, fill #3
  Filled 2023-09-06 (×2): qty 60, 30d supply, fill #4

## 2023-04-23 NOTE — Progress Notes (Signed)
Specialty Pharmacy Ongoing Clinical Assessment Note  [name] [name] is a 67 y.o. male who is being followed by the specialty pharmacy service for RxSp Bleeding Disorders   Patient's specialty medication(s) reviewed today: Ruxolitinib Phosphate (JAKAFI)   Missed doses in the last 4 weeks: 0   Patient/Caregiver did not have any additional questions or concerns.   Therapeutic benefit summary: Patient is achieving benefit   Adverse events/side effects summary: No adverse events/side effects   Patient's therapy is appropriate to: Continue    Goals Addressed             This Visit's Progress    Stabilization of disease       Patient is on track. Patient will maintain adherence.  Dr. Myna Hidalgo notated at his office visit yesterday that he is doing well at this time and his platelet count and splenic volume where both decreased.          Follow up:  6 months  [name] [name] Specialty Pharmacist

## 2023-04-23 NOTE — Progress Notes (Signed)
Specialty Pharmacy Refill Coordination Note  Cindy Austgen is a 67 y.o. male contacted today regarding refills of specialty medication(s) Ruxolitinib Phosphate (JAKAFI)   Patient requested Delivery   Delivery date: 05/03/23   Verified address: [address] [place]   Medication will be filled on 05/02/23.  Refill Request sent to MD; Call if any delays

## 2023-04-24 ENCOUNTER — Other Ambulatory Visit: Payer: Self-pay

## 2023-04-24 ENCOUNTER — Other Ambulatory Visit (HOSPITAL_COMMUNITY): Payer: Self-pay

## 2023-04-29 DIAGNOSIS — L570 Actinic keratosis: Secondary | ICD-10-CM | POA: Diagnosis not present

## 2023-04-29 DIAGNOSIS — Z85828 Personal history of other malignant neoplasm of skin: Secondary | ICD-10-CM | POA: Diagnosis not present

## 2023-04-29 DIAGNOSIS — L820 Inflamed seborrheic keratosis: Secondary | ICD-10-CM | POA: Diagnosis not present

## 2023-04-29 DIAGNOSIS — Z129 Encounter for screening for malignant neoplasm, site unspecified: Secondary | ICD-10-CM | POA: Diagnosis not present

## 2023-04-29 DIAGNOSIS — B009 Herpesviral infection, unspecified: Secondary | ICD-10-CM | POA: Diagnosis not present

## 2023-05-02 ENCOUNTER — Encounter: Payer: Self-pay | Admitting: Family

## 2023-05-02 ENCOUNTER — Ambulatory Visit
Admission: RE | Admit: 2023-05-02 | Discharge: 2023-05-02 | Disposition: A | Discharge status: Home / Self Care | Payer: Medicare Other | Source: Ambulatory Visit | Attending: Orthopedic Surgery | Admitting: Orthopedic Surgery

## 2023-05-02 DIAGNOSIS — M19011 Primary osteoarthritis, right shoulder: Secondary | ICD-10-CM

## 2023-05-08 ENCOUNTER — Encounter: Payer: Self-pay | Admitting: Family

## 2023-05-13 DIAGNOSIS — Z8669 Personal history of other diseases of the nervous system and sense organs: Secondary | ICD-10-CM | POA: Insufficient documentation

## 2023-05-13 DIAGNOSIS — L719 Rosacea, unspecified: Secondary | ICD-10-CM | POA: Insufficient documentation

## 2023-05-13 DIAGNOSIS — H43812 Vitreous degeneration, left eye: Secondary | ICD-10-CM | POA: Insufficient documentation

## 2023-05-13 DIAGNOSIS — H25012 Cortical age-related cataract, left eye: Secondary | ICD-10-CM | POA: Insufficient documentation

## 2023-05-13 DIAGNOSIS — H0100A Unspecified blepharitis right eye, upper and lower eyelids: Secondary | ICD-10-CM | POA: Insufficient documentation

## 2023-05-13 DIAGNOSIS — H43391 Other vitreous opacities, right eye: Secondary | ICD-10-CM | POA: Insufficient documentation

## 2023-05-13 DIAGNOSIS — H5213 Myopia, bilateral: Secondary | ICD-10-CM | POA: Insufficient documentation

## 2023-05-13 DIAGNOSIS — H2513 Age-related nuclear cataract, bilateral: Secondary | ICD-10-CM | POA: Insufficient documentation

## 2023-05-15 DIAGNOSIS — R3915 Urgency of urination: Secondary | ICD-10-CM | POA: Diagnosis not present

## 2023-05-15 DIAGNOSIS — N202 Calculus of kidney with calculus of ureter: Secondary | ICD-10-CM | POA: Diagnosis not present

## 2023-05-15 DIAGNOSIS — N401 Enlarged prostate with lower urinary tract symptoms: Secondary | ICD-10-CM | POA: Diagnosis not present

## 2023-05-15 DIAGNOSIS — N130 Hydronephrosis with ureteropelvic junction obstruction: Secondary | ICD-10-CM | POA: Diagnosis not present

## 2023-05-15 DIAGNOSIS — M19211 Secondary osteoarthritis, right shoulder: Secondary | ICD-10-CM | POA: Diagnosis not present

## 2023-05-17 DIAGNOSIS — H0100A Unspecified blepharitis right eye, upper and lower eyelids: Secondary | ICD-10-CM | POA: Diagnosis not present

## 2023-05-17 DIAGNOSIS — H25012 Cortical age-related cataract, left eye: Secondary | ICD-10-CM | POA: Diagnosis not present

## 2023-05-17 DIAGNOSIS — L718 Other rosacea: Secondary | ICD-10-CM | POA: Diagnosis not present

## 2023-05-17 DIAGNOSIS — H2513 Age-related nuclear cataract, bilateral: Secondary | ICD-10-CM | POA: Diagnosis not present

## 2023-05-22 ENCOUNTER — Encounter: Payer: Self-pay | Admitting: Hematology & Oncology

## 2023-05-22 ENCOUNTER — Encounter: Payer: Self-pay | Admitting: Adult Health

## 2023-05-22 ENCOUNTER — Encounter: Payer: Self-pay | Admitting: Cardiology

## 2023-05-27 ENCOUNTER — Telehealth: Payer: Self-pay | Admitting: *Deleted

## 2023-05-27 NOTE — Telephone Encounter (Signed)
   Pre-operative Risk Assessment    Patient Name: [name] [name]  DOB: Jan 28, 1956 MRN: [id number]   Date of last office visit: 11/30/22 DR. CAMNITZ Date of next office visit: NONE   Request for Surgical Clearance    Procedure:   RIGHT REVERSE TOTAL SHOULDER ARTHROPLASTY  Date of Surgery:  Clearance TBD                                Surgeon:  DR. Jones Broom Surgeon's Group or Practice Name:  [facility] Phone number:  [phone] ATTN: Emilio Aspen Fax number:  [phone]   Type of Clearance Requested:   - Medical  - Pharmacy:  Hold Apixaban (Eliquis)     Type of Anesthesia:   CHOICE   Additional requests/questions:    Elpidio Anis   05/27/2023, 9:15 AM

## 2023-05-29 ENCOUNTER — Other Ambulatory Visit: Payer: Self-pay

## 2023-05-29 ENCOUNTER — Telehealth: Payer: Self-pay | Admitting: *Deleted

## 2023-05-29 NOTE — Progress Notes (Signed)
Specialty Pharmacy Refill Coordination Note  [name] [name] is a 68 y.o. male contacted today regarding refills of specialty medication(s) Ruxolitinib Phosphate (JAKAFI)   Patient requested Pickup at [facility] date: 06/03/23   Medication will be filled on 05/31/23.   Patient aware of $2000 copayment.

## 2023-05-29 NOTE — Telephone Encounter (Signed)
Patient with diagnosis of atrial fibrillation on Eliquis for anticoagulation.    Procedure:   RIGHT REVERSE TOTAL SHOULDER ARTHROPLASTY   Date of Surgery:  Clearance TBD    CHA2DS2-VASc Score = 4   This indicates a 4.8% annual risk of stroke. The patient's score is based upon: CHF History: 0 HTN History: 1 Diabetes History: 0 Stroke History: 2 Vascular Disease History: 0 Age Score: 1 Gender Score: 0   Per chart, CVA was acute ischemic stroke 09/2018   CrCl 84 Platelet count 341  Per office protocol, patient can hold Eliquis for 3 days prior to procedure.   Patient will not need bridging with Lovenox (enoxaparin) around procedure.  **This guidance is not considered finalized until pre-operative APP has relayed final recommendations.**

## 2023-05-29 NOTE — Telephone Encounter (Signed)
   Name: [name] [name]  DOB: [birth year]  MRN: [id number]  Primary Cardiologist: Olga Millers, MD   Preoperative team, please contact this patient and set up a phone call appointment for further preoperative risk assessment. Please obtain consent and complete medication review. Thank you for your help.  I confirm that guidance regarding antiplatelet and oral anticoagulation therapy has been completed and, if necessary, noted below.  Per office protocol, patient can hold Eliquis for 3 days prior to procedure.   Patient will not need bridging with Lovenox (enoxaparin) around procedure.  I also confirmed the patient resides in the state of [place]. As per [facility] Medical Board telemedicine laws, the patient must reside in the state in which the provider is licensed.   Napoleon Form, Leodis Rains, NP 05/29/2023, 9:18 AM [facility]

## 2023-05-29 NOTE — Telephone Encounter (Signed)
Pt has been scheduled tele pre op appt 06/12/23. Med rec and consent are done.

## 2023-05-29 NOTE — Telephone Encounter (Signed)
Pt has been scheduled tele pre op appt 06/12/23. Med rec and consent are done.     Patient Consent for Virtual Visit        [name] [name] has provided verbal consent on 05/29/2023 for a virtual visit (video or telephone).   CONSENT FOR VIRTUAL VISIT FOR:  [name] [name]  By participating in this virtual visit I agree to the following:  I hereby voluntarily request, consent and authorize [facility] and its employed or contracted physicians, physician assistants, nurse practitioners or other licensed health care professionals (the Practitioner), to provide me with telemedicine health care services (the "Services") as deemed necessary by the treating Practitioner. I acknowledge and consent to receive the Services by the Practitioner via telemedicine. I understand that the telemedicine visit will involve communicating with the Practitioner through live audiovisual communication technology and the disclosure of certain medical information by electronic transmission. I acknowledge that I have been given the opportunity to request an in-person assessment or other available alternative prior to the telemedicine visit and am voluntarily participating in the telemedicine visit.  I understand that I have the right to withhold or withdraw my consent to the use of telemedicine in the course of my care at any time, without affecting my right to future care or treatment, and that the Practitioner or I may terminate the telemedicine visit at any time. I understand that I have the right to inspect all information obtained and/or recorded in the course of the telemedicine visit and may receive copies of available information for a reasonable fee.  I understand that some of the potential risks of receiving the Services via telemedicine include:  Delay or interruption in medical evaluation due to technological equipment failure or disruption; Information transmitted may not be sufficient (e.g. poor resolution  of images) to allow for appropriate medical decision making by the Practitioner; and/or  In rare instances, security protocols could fail, causing a breach of personal health information.  Furthermore, I acknowledge that it is my responsibility to provide information about my medical history, conditions and care that is complete and accurate to the best of my ability. I acknowledge that Practitioner's advice, recommendations, and/or decision may be based on factors not within their control, such as incomplete or inaccurate data provided by me or distortions of diagnostic images or specimens that may result from electronic transmissions. I understand that the practice of medicine is not an exact science and that Practitioner makes no warranties or guarantees regarding treatment outcomes. I acknowledge that a copy of this consent can be made available to me via my patient portal [facility] MyChart), or I can request a printed copy by calling the office of [facility].    I understand that my insurance will be billed for this visit.   I have read or had this consent read to me. I understand the contents of this consent, which adequately explains the benefits and risks of the Services being provided via telemedicine.  I have been provided ample opportunity to ask questions regarding this consent and the Services and have had my questions answered to my satisfaction. I give my informed consent for the services to be provided through the use of telemedicine in my medical care

## 2023-05-31 ENCOUNTER — Other Ambulatory Visit: Payer: Self-pay

## 2023-06-03 ENCOUNTER — Encounter (HOSPITAL_COMMUNITY): Payer: Self-pay | Admitting: Physician Assistant

## 2023-06-03 ENCOUNTER — Ambulatory Visit (HOSPITAL_COMMUNITY)
Admission: RE | Admit: 2023-06-03 | Discharge: 2023-06-03 | Disposition: A | Discharge status: Home / Self Care | Payer: Medicare Other | Source: Ambulatory Visit | Attending: Physician Assistant | Admitting: Physician Assistant

## 2023-06-03 VITALS — BP 114/64 | HR 61 | Ht 71.0 in | Wt 220.0 lb

## 2023-06-03 DIAGNOSIS — G4733 Obstructive sleep apnea (adult) (pediatric): Secondary | ICD-10-CM | POA: Diagnosis not present

## 2023-06-03 DIAGNOSIS — R9431 Abnormal electrocardiogram [ECG] [EKG]: Secondary | ICD-10-CM | POA: Diagnosis not present

## 2023-06-03 DIAGNOSIS — Z7901 Long term (current) use of anticoagulants: Secondary | ICD-10-CM | POA: Diagnosis not present

## 2023-06-03 DIAGNOSIS — I480 Paroxysmal atrial fibrillation: Secondary | ICD-10-CM | POA: Diagnosis not present

## 2023-06-03 DIAGNOSIS — D6869 Other thrombophilia: Secondary | ICD-10-CM

## 2023-06-03 DIAGNOSIS — I2510 Atherosclerotic heart disease of native coronary artery without angina pectoris: Secondary | ICD-10-CM | POA: Diagnosis not present

## 2023-06-03 DIAGNOSIS — I4891 Unspecified atrial fibrillation: Secondary | ICD-10-CM | POA: Diagnosis not present

## 2023-06-03 DIAGNOSIS — E785 Hyperlipidemia, unspecified: Secondary | ICD-10-CM | POA: Diagnosis not present

## 2023-06-03 DIAGNOSIS — I10 Essential (primary) hypertension: Secondary | ICD-10-CM | POA: Diagnosis not present

## 2023-06-03 NOTE — Progress Notes (Signed)
Primary Care Physician: Garlan Fillers, MD Primary Cardiologist: Dr [name]  Primary Electrophysiologist: Dr Elberta Fortis Referring Physician: Dr Domingo Pulse Rehman is a 68 y.o. male with a history of HTN, HLD, CVA, OSA, CAD, HLD, atrial fibrillation who presents for follow up in the [facility]. He presented to the hospital 10/17/2021 with rapid atrial fibrillation. He had sudden onset of brief cramping in his left upper chest. He developed palpitations and started feeling poorly. He converted to sinus rhythm while in the hospital without need for intervention. Patient is on Eliquis for a CHADS2VASC score of 3. He was seen by Dr Elberta Fortis and underwent afib ablation 04/13/22.  Patient returns for follow up for atrial fibrillation. He reports that he has done well since his last visit. He did have an episode of afib two night ago which lasted 15-20 minutes. He has not had to use an PRN diltiazem recently. No significant bleeding issues on anticoagulation.   Today, he denies symptoms of chest pain, orthopnea, PND, lower extremity edema, dizziness, presyncope, syncope, snoring, daytime somnolence, bleeding, or neurologic sequela. The patient is tolerating medications without difficulties and is otherwise without complaint today.    Atrial Fibrillation Risk Factors:  he does have symptoms or diagnosis of sleep apnea. he does not have a history of rheumatic fever.   Atrial Fibrillation Management history:  Previous antiarrhythmic drugs: none Previous cardioversions: none Previous ablations: 04/13/22 Anticoagulation history: Eliquis   Past Medical History:  Diagnosis Date   Diverticulosis of colon (without mention of hemorrhage)    Hemorrhoids    Hypertension    IBS (irritable bowel syndrome)    Iron deficiency anemia due to chronic blood loss 09/26/2017   Irregular heartbeat    Nephrolithiasis    Other and unspecified hyperlipidemia    Perirectal  fistula    Polycythemia, secondary    Stroke (HCC)    Stye    blocker duct right eye lid    Current Outpatient Medications  Medication Sig Dispense Refill   acetaminophen (TYLENOL) 500 MG tablet Take 1,000 mg by mouth every 6 (six) hours as needed for mild pain.      amitriptyline (ELAVIL) 25 MG tablet Take 2 tablets (50 mg total) by mouth at bedtime. TAKE 1 TABLET BY MOUTH EVERYDAY AT BEDTIME 180 tablet 3   apixaban (ELIQUIS) 5 MG TABS tablet Take 1 tablet (5 mg total) by mouth 2 (two) times daily. 180 tablet 1   aspirin EC 81 MG EC tablet Take 1 tablet (81 mg total) by mouth daily. 60 tablet 0   diltiazem (CARDIZEM CD) 120 MG 24 hr capsule Take 1 capsule (120 mg total) by mouth daily. 90 capsule 3   diltiazem (CARDIZEM) 30 MG tablet Take 1 tablet every 4 hours AS NEEDED for AFIB heart rate >100 as long as top BP >100. 30 tablet 1   fluorouracil (EFUDEX) 5 % cream Apply 1 application  topically 2 (two) times daily as needed (dry skin).     gabapentin (NEURONTIN) 300 MG capsule Take 2 capsules (600 mg total) by mouth 2 (two) times daily. 360 capsule 0   GEMTESA 75 MG TABS Take 1 tablet by mouth daily.     hydrOXYzine (VISTARIL) 25 MG capsule 1 capsule 1 hour before bedtime as needed Orally Once a day for 90 days As needed for sleep     losartan (COZAAR) 100 MG tablet Take 1 tablet (100 mg total) by mouth daily. 90 tablet 3  Melatonin 10 MG TABS Take 10 mg by mouth at bedtime.     Multiple Vitamin (MULTIVITAMIN WITH MINERALS) TABS tablet Take 1 tablet by mouth in the morning.     propranolol ER (INDERAL LA) 160 MG SR capsule TAKE 1 CAPSULE BY MOUTH EVERY DAY 90 capsule 3   rosuvastatin (CRESTOR) 20 MG tablet Take 1 tablet (20 mg total) by mouth daily. 90 tablet 3   ruxolitinib phosphate (JAKAFI) 10 MG tablet TAKE 1 TABLET BY MOUTH 2 TIMES DAILY. 60 tablet 4   tadalafil (CIALIS) 5 MG tablet Take 5 mg by mouth every other day. In the morning.     zolpidem (AMBIEN CR) 12.5 MG CR tablet Take  6.25 mg by mouth at bedtime as needed for sleep.     No current facility-administered medications for this encounter.    ROS- All systems are reviewed and negative except as per the HPI above.  Physical Exam: Vitals:   06/03/23 0926  BP: 114/64  Pulse: 61  Weight: 99.8 kg  Height: 5\' 11"  (1.803 m)    GEN: Well nourished, well developed in no acute distress CARDIAC: Regular rate and rhythm, no murmurs, rubs, gallops RESPIRATORY:  Clear to auscultation without rales, wheezing or rhonchi  ABDOMEN: Soft, non-tender, non-distended EXTREMITIES:  No edema; No deformity    Wt Readings from Last 3 Encounters:  06/03/23 99.8 kg  04/22/23 98 kg  04/11/23 97.5 kg    EKG today demonstrates  SR, LAFB, NST Vent. rate 61 BPM PR interval 196 ms QRS duration 114 ms QT/QTcB 426/428 ms   Echo 09/07/21 demonstrated   1. Left ventricular ejection fraction, by estimation, is 55 to 60%. Left  ventricular ejection fraction by 3D volume is 58 %. The left ventricle has  normal function. The left ventricle has no regional wall motion  abnormalities. Left ventricular diastolic parameters are consistent with Grade I diastolic dysfunction (impaired relaxation). The average left ventricular global longitudinal strain is -21.3 %. The global longitudinal strain is normal.   2. Right ventricular systolic function is normal. The right ventricular  size is mildly enlarged. Tricuspid regurgitation signal is inadequate for  assessing PA pressure.   3. The mitral valve is normal in structure. Trivial mitral valve  regurgitation. No evidence of mitral stenosis.   4. The aortic valve is normal in structure. Aortic valve regurgitation is  not visualized. No aortic stenosis is present.   5. Aortic dilatation noted. There is mild dilatation of the ascending  aorta, measuring 39 mm.   Comparison(s): 09/13/18 EF 55-60%.   Epic records are reviewed at length today  CHA2DS2-VASc Score = 4  The patient's score is  based upon: CHF History: 0 HTN History: 1 Diabetes History: 0 Stroke History: 2 Vascular Disease History: 0 Age Score: 1 Gender Score: 0       ASSESSMENT AND PLAN: Paroxysmal Atrial Fibrillation (ICD10:  I48.0) The patient's CHA2DS2-VASc score is 4, indicating a 4.8% annual risk of stroke.   S/p afib ablation 04/13/22 Patient appears to be maintaining SR with only rare brief episodes.  Continue Eliquis 5 mg BID Continue diltiazem 120 mg daily with 30 mg PRN q 4 hours for heart racing Continue propranolol 160 mg daily  Secondary Hypercoagulable State (ICD10:  D68.69) The patient is at significant risk for stroke/thromboembolism based upon his CHA2DS2-VASc Score of 4.  Continue Apixaban (Eliquis).   HTN Stable on current regimen   Follow up in the AF clinic in 6 months.  Jorja Loa PA-C Afib Clinic [facility] [address] [place], [place] [address] [phone] 06/03/2023 9:32 AM

## 2023-06-04 DIAGNOSIS — I10 Essential (primary) hypertension: Secondary | ICD-10-CM | POA: Diagnosis not present

## 2023-06-04 DIAGNOSIS — I480 Paroxysmal atrial fibrillation: Secondary | ICD-10-CM | POA: Diagnosis not present

## 2023-06-04 DIAGNOSIS — D473 Essential (hemorrhagic) thrombocythemia: Secondary | ICD-10-CM | POA: Diagnosis not present

## 2023-06-12 ENCOUNTER — Ambulatory Visit: Payer: Medicare Other | Attending: Cardiology | Admitting: Cardiology

## 2023-06-12 DIAGNOSIS — Z01810 Encounter for preprocedural cardiovascular examination: Secondary | ICD-10-CM

## 2023-06-12 NOTE — Progress Notes (Signed)
 Virtual Visit via Telephone Note   Because of [name]'s co-morbid illnesses, he is at least at moderate risk for complications without adequate follow up.  This format is felt to be most appropriate for this patient at this time.  The patient did not have access to video technology/had technical difficulties with video requiring transitioning to audio format only (telephone).  All issues noted in this document were discussed and addressed.  No physical exam could be performed with this format.  Please refer to the patient's chart for his consent to telehealth for [name].  Evaluation Performed:  Preoperative cardiovascular risk assessment _____________   Date:  06/12/2023   Patient ID:  [name] [name], DOB 1955/12/03, MRN [id number] Patient Location:  Home Provider location:   Office  Primary Care Provider:  [name], MD Primary Cardiologist:  Redell Shallow, MD  Chief Complaint / Patient Profile  68 y.o. y/o male with a h/o paroxysmal atrial fibrillation, hypertension, hypercholesterolemia, CVA in May 2020 felt to be due to fibromuscular dysplasia, who is pending right reverse total shoulder arthroplasty with Dr. Dozier and presents today for telephonic preoperative cardiovascular risk assessment. History of Present Illness   [name] [name] is a 68 y.o. male who presents via audio/video conferencing for a telehealth visit today.  Pt was last seen in cardiology clinic on 06/03/23 by Quita Kicks, PA.  At that time [name] was doing well.  The patient is now pending procedure as outlined above. Since his last visit, he has remained stable from a cardiac standpoint.  Today he denies chest pain, new shortness of breath, lower extremity edema, fatigue, palpitations, melena, hematuria, hemoptysis, diaphoresis, weakness, presyncope, syncope, orthopnea, and PND.  Past Medical History    Past Medical History:  Diagnosis Date   Diverticulosis of colon (without  mention of hemorrhage)    Hemorrhoids    Hypertension    IBS (irritable bowel syndrome)    Iron deficiency anemia due to chronic blood loss 09/26/2017   Irregular heartbeat    Nephrolithiasis    Other and unspecified hyperlipidemia    Perirectal fistula    Polycythemia, secondary    Stroke (HCC)    Stye    blocker duct right eye lid   Past Surgical History:  Procedure Laterality Date   ATRIAL FIBRILLATION ABLATION N/A 04/13/2022   Procedure: ATRIAL FIBRILLATION ABLATION;  Surgeon: Inocencio Soyla Lunger, MD;  Location: [facility];  Service: Cardiovascular;  Laterality: N/A;   RETINAL DETACHMENT SURGERY     ROTATOR CUFF REPAIR     rt.   VASECTOMY     Allergies Allergies  Allergen Reactions   Trazodone Hcl Other (See Comments)    Other reaction(s): too sleepy in AM   Mirtazapine Other (See Comments)    Other reaction(s): too sleepy in the AM  Other Reaction(s): Other (See Comments)    Other reaction(s): too sleepy in the AM   Niacin Itching and Other (See Comments)    Flushed feeling  Other Reaction(s): Other (See Comments)    Flushed feeling   Home Medications    Prior to Admission medications   Medication Sig Start Date End Date Taking? Authorizing Provider  acetaminophen  (TYLENOL ) 500 MG tablet Take 1,000 mg by mouth every 6 (six) hours as needed for mild pain.     [provider]  amitriptyline  (ELAVIL ) 25 MG tablet Take 2 tablets (50 mg total) by mouth at bedtime. TAKE 1 TABLET BY MOUTH EVERYDAY AT BEDTIME 04/11/23  Whitfield Raisin, NP  apixaban  (ELIQUIS ) 5 MG TABS tablet Take 1 tablet (5 mg total) by mouth 2 (two) times daily. 01/16/23 01/17/24  Camnitz, Soyla Lunger, MD  aspirin  EC 81 MG EC tablet Take 1 tablet (81 mg total) by mouth daily. 09/16/18   [name], MD  diltiazem  (CARDIZEM  CD) 120 MG 24 hr capsule Take 1 capsule (120 mg total) by mouth daily. 01/16/23   Pietro Redell RAMAN, MD  diltiazem  (CARDIZEM ) 30 MG tablet Take 1 tablet every 4 hours  AS NEEDED for AFIB heart rate >100 as long as top BP >100. 06/27/22   Fenton, Clint R, PA  fluorouracil (EFUDEX) 5 % cream Apply 1 application  topically 2 (two) times daily as needed (dry skin). 10/28/20   [provider]  gabapentin  (NEURONTIN ) 300 MG capsule Take 2 capsules (600 mg total) by mouth 2 (two) times daily. 04/03/23   Lomax, Amy, NP  GEMTESA 75 MG TABS Take 1 tablet by mouth daily. 10/04/22   [provider]  hydrOXYzine (VISTARIL) 25 MG capsule 1 capsule 1 hour before bedtime as needed Orally Once a day for 90 days As needed for sleep 01/03/22   [provider]  losartan  (COZAAR ) 100 MG tablet Take 1 tablet (100 mg total) by mouth daily. 01/16/23   Pietro Redell RAMAN, MD  Melatonin 10 MG TABS Take 10 mg by mouth at bedtime.    [provider]  Multiple Vitamin (MULTIVITAMIN WITH MINERALS) TABS tablet Take 1 tablet by mouth in the morning.    [provider]  propranolol  ER (INDERAL  LA) 160 MG SR capsule TAKE 1 CAPSULE BY MOUTH EVERY DAY 09/28/22   Pietro Redell RAMAN, MD  rosuvastatin  (CRESTOR ) 20 MG tablet Take 1 tablet (20 mg total) by mouth daily. 01/16/23   Pietro Redell RAMAN, MD  ruxolitinib  phosphate (JAKAFI ) 10 MG tablet TAKE 1 TABLET BY MOUTH 2 TIMES DAILY. 04/23/23 04/22/24  Timmy Maude SAUNDERS, MD  tadalafil (CIALIS) 5 MG tablet Take 5 mg by mouth every other day. In the morning. 05/16/21   [provider]  zolpidem  (AMBIEN  CR) 12.5 MG CR tablet Take 6.25 mg by mouth at bedtime as needed for sleep.    [provider]   Physical Exam    Vital Signs:  [name] does not have vital signs available for review today.  Given telephonic nature of communication, physical exam is limited. AAOx3. NAD. Normal affect.  Speech and respirations are unlabored.  Accessory Clinical Findings   None Assessment & Plan    1.  Preoperative Cardiovascular Risk Assessment: Right reverse total shoulder arthroplasty with Dr. Dozier.    Mr. Manuele's perioperative risk of a major cardiac event is 0.9% according to the Revised Cardiac Risk Index (RCRI).  Therefore, he is at low risk for perioperative complications.   His functional capacity is excellent at 9.89 METs according to the Duke Activity Status Index (DASI). Recommendations: According to ACC/AHA guidelines, no further cardiovascular testing needed.  The patient may proceed to surgery at acceptable risk.   Antiplatelet and/or Anticoagulation Recommendations: Eliquis  (Apixaban ) can be held for 3 days prior to surgery.  Please resume post op when felt to be safe.    The patient was advised that if he develops new symptoms prior to surgery to contact our office to arrange for a follow-up visit, and he verbalized understanding.  A copy of this note will be routed to requesting surgeon. Time:   Today, I have spent 10 minutes  with the patient with telehealth technology discussing medical history, symptoms, and management plan.    Reeya Bound D Merleen Picazo, NP  06/12/2023, 9:05 AM

## 2023-06-19 ENCOUNTER — Other Ambulatory Visit: Payer: Self-pay

## 2023-06-19 DIAGNOSIS — G629 Polyneuropathy, unspecified: Secondary | ICD-10-CM

## 2023-06-19 MED ORDER — GABAPENTIN 300 MG PO CAPS: 600.0000 mg | ORAL_CAPSULE | Freq: Two times a day (BID) | ORAL | 0 refills | Status: DC

## 2023-06-21 ENCOUNTER — Other Ambulatory Visit (HOSPITAL_COMMUNITY): Payer: Self-pay

## 2023-06-25 ENCOUNTER — Other Ambulatory Visit: Payer: Self-pay | Admitting: Orthopedic Surgery

## 2023-07-01 ENCOUNTER — Other Ambulatory Visit: Payer: Self-pay

## 2023-07-01 ENCOUNTER — Other Ambulatory Visit (HOSPITAL_COMMUNITY): Payer: Self-pay

## 2023-07-01 ENCOUNTER — Other Ambulatory Visit: Payer: Self-pay | Admitting: Pharmacy Technician

## 2023-07-01 DIAGNOSIS — Z125 Encounter for screening for malignant neoplasm of prostate: Secondary | ICD-10-CM | POA: Diagnosis not present

## 2023-07-01 DIAGNOSIS — E785 Hyperlipidemia, unspecified: Secondary | ICD-10-CM | POA: Diagnosis not present

## 2023-07-01 MED ORDER — AMITRIPTYLINE HCL 25 MG PO TABS: 50.0000 mg | ORAL_TABLET | Freq: Every day | ORAL | 3 refills | Status: DC

## 2023-07-01 NOTE — Progress Notes (Signed)
 Specialty Pharmacy Refill Coordination Note  [name] [name] is a 68 y.o. male contacted today regarding refills of specialty medication(s) Ruxolitinib Phosphate (JAKAFI)   Patient requested Delivery   Delivery date: 07/08/23   Verified address: Patient address [address]  [place]   Medication will be filled on 07/05/23.

## 2023-07-02 ENCOUNTER — Telehealth: Payer: Self-pay | Admitting: Adult Health

## 2023-07-02 MED ORDER — AMITRIPTYLINE HCL 25 MG PO TABS: 50.0000 mg | ORAL_TABLET | Freq: Every day | ORAL | 3 refills | Status: AC

## 2023-07-02 NOTE — Telephone Encounter (Signed)
 I called and spoke with pharmacist Roe Coombs and explained per note on 05/22/23 " Increase amitriptyline to 50 mg nightly as noted above"  Don said I did not need to send a new Rx since he was given verbal via phone. Rx directions were updated on medication list

## 2023-07-02 NOTE — Telephone Encounter (Signed)
 Tyler(tech at pharmacy, Roe Coombs is pharmacist) Joselyn Glassman called asking for clarification on the Rx for amitriptyline (ELAVIL) 25 MG tablet please call.

## 2023-07-04 DIAGNOSIS — Z1212 Encounter for screening for malignant neoplasm of rectum: Secondary | ICD-10-CM | POA: Diagnosis not present

## 2023-07-05 ENCOUNTER — Other Ambulatory Visit: Payer: Self-pay

## 2023-07-10 ENCOUNTER — Encounter: Payer: Self-pay | Admitting: Hematology & Oncology

## 2023-07-10 ENCOUNTER — Other Ambulatory Visit: Payer: Self-pay | Admitting: Cardiology

## 2023-07-10 ENCOUNTER — Encounter (HOSPITAL_COMMUNITY): Payer: Self-pay

## 2023-07-10 DIAGNOSIS — I480 Paroxysmal atrial fibrillation: Secondary | ICD-10-CM

## 2023-07-10 NOTE — Progress Notes (Addendum)
 PCP - Jackquline Denmark, MD Cardiologist - Tele visit 06-12-23 preop eval epic [name] [name]. NP  PPM/ICD -  Device Orders -  Rep Notified -   Chest x-ray -  EKG - 06-03-23 epic Stress Test -  ECHO - 09-07-21 epic Cardiac Cath -  CT cardiac-04-09-22  Sleep Study -  CPAP -   Fasting Blood Sugar -  Checks Blood Sugar _____ times a day  Blood Thinner Instructions:Elequis hold 3 days  Last Dose- Aspirin Instructions:  ERAS Protcol - PRE-SURGERY Ensure or G2-    COVID vaccine -  Activity-- Anesthesia review: CVA 2020,HTN, polycythemia, Afib  Patient denies shortness of breath, fever, cough and chest pain at PAT appointment   All instructions explained to the patient, with a verbal understanding of the material. Patient agrees to go over the instructions while at home for a better understanding. Patient also instructed to self quarantine after being tested for COVID-19. The opportunity to ask questions was provided.

## 2023-07-10 NOTE — Patient Instructions (Addendum)
 SURGICAL WAITING ROOM VISITATION  Patients having surgery or a procedure may have no more than 2 support people in the waiting area - these visitors may rotate.    Children under the age of 27 must have an adult with them who is not the patient.  Due to an increase in RSV and influenza rates and associated hospitalizations, children ages 73 and under may not visit patients in [facility] hospitals.  Visitors with respiratory illnesses are discouraged from visiting and should remain at home.  If the patient needs to stay at the hospital during part of their recovery, the visitor guidelines for inpatient rooms apply. Pre-op nurse will coordinate an appropriate time for 1 support person to accompany patient in pre-op.  This support person may not rotate.    Please refer to the [facility] website for the visitor guidelines for Inpatients (after your surgery is over and you are in a regular room).       Your procedure is scheduled on: 07-18-23   Report to [facility] Main Entrance    Report to admitting at      0515 AM   Call this number if you have problems the morning of surgery [phone]   Do not eat food :After Midnight.   After Midnight you may have the following liquids until _0430 _____ AM/  DAY OF SURGERY  then nothing by mouth  Water Non-Citrus Juices (without pulp, NO RED-Apple, White grape, White cranberry) Black Coffee (NO MILK/CREAM OR CREAMERS, sugar ok)  Clear Tea (NO MILK/CREAM OR CREAMERS, sugar ok) regular and decaf                             Plain Jell-O (NO RED)                                           Fruit ices (not with fruit pulp, NO RED)                                     Popsicles (NO RED)                                                               Sports drinks like Gatorade (NO RED)                  The day of surgery:  Drink ONE (1) Pre-Surgery Clear Ensure BY 0430  AM the morning of surgery. Drink in one sitting. Do not sip.  This  drink was given to you during your hospital  pre-op appointment visit. Nothing else to drink after completing the  Pre-Surgery Clear Ensure.          If you have questions, please contact your surgeon's office.   FOLLOW BOWEL PREP AND ANY ADDITIONAL PRE OP INSTRUCTIONS YOU RECEIVED FROM YOUR SURGEON'S OFFICE!!!     Oral Hygiene is also important to reduce your risk of infection.  Remember - BRUSH YOUR TEETH THE MORNING OF SURGERY WITH YOUR REGULAR TOOTHPASTE  DENTURES WILL BE REMOVED PRIOR TO SURGERY PLEASE DO NOT APPLY "Poly grip" OR ADHESIVES!!!   Do NOT smoke after Midnight   Stop all vitamins and herbal supplements 7 days before surgery.   Take these medicines the morning of surgery with A SIP OF WATER: Diltiazem, gabapentin, Gemtesa, Rosuvastatin, Jakafi, Tylenol if needed    Bring CPAP mask and tubing day of surgery.                              You may not have any metal on your body including hair pins, jewelry, and body piercing             Do not wear  lotions, powders, perfumes/cologne, or deodorant              Men may shave face and neck.   Do not bring valuables to the hospital. [place] IS NOT             RESPONSIBLE   FOR VALUABLES.   Contacts, glasses, dentures or bridgework may not be worn into surgery.   Bring small overnight bag day of surgery.   DO NOT BRING YOUR HOME MEDICATIONS TO THE HOSPITAL. PHARMACY WILL DISPENSE MEDICATIONS LISTED ON YOUR MEDICATION LIST TO YOU DURING YOUR ADMISSION IN THE HOSPITAL!    Patients discharged on the day of surgery will not be allowed to drive home.  Someone NEEDS to stay with you for the first 24 hours after anesthesia.   Special Instructions: Bring a copy of your healthcare power of attorney and living will documents the day of surgery if you haven't scanned them before.              Please read over the following fact sheets you were given: IF YOU HAVE QUESTIONS ABOUT YOUR  PRE-OP INSTRUCTIONS PLEASE CALL [phone]   If you test positive for Covid or have been in contact with anyone that has tested positive in the last 10 days please notify you surgeon.      Pre-operative 5 CHG Bath Instructions   You can play a key role in reducing the risk of infection after surgery. Your skin needs to be as free of germs as possible. You can reduce the number of germs on your skin by washing with CHG (chlorhexidine gluconate) soap before surgery. CHG is an antiseptic soap that kills germs and continues to kill germs even after washing.   DO NOT use if you have an allergy to chlorhexidine/CHG or antibacterial soaps. If your skin becomes reddened or irritated, stop using the CHG and notify one of our RNs at [phone].   Please shower with the CHG soap starting 4 days before surgery using the following schedule:     Please keep in mind the following:  DO NOT shave, including legs and underarms, starting the day of your first shower.   You may shave your face at any point before/day of surgery.  Place clean sheets on your bed the day you start using CHG soap. Use a clean washcloth (not used since being washed) for each shower. DO NOT sleep with pets once you start using the CHG.   CHG Shower Instructions:  If you choose to wash your hair and private area, wash first with your normal shampoo/soap.  After you use shampoo/soap, rinse your hair and body thoroughly to remove  shampoo/soap residue.  Turn the water OFF and apply about 3 tablespoons (45 ml) of CHG soap to a CLEAN washcloth.  Apply CHG soap ONLY FROM YOUR NECK DOWN TO YOUR TOES (washing for 3-5 minutes)  DO NOT use CHG soap on face, private areas, open wounds, or sores.  Pay special attention to the area where your surgery is being performed.  If you are having back surgery, having someone wash your back for you may be helpful. Wait 2 minutes after CHG soap is applied, then you may rinse off the CHG soap.   Pat dry with a clean towel  Put on clean clothes/pajamas   If you choose to wear lotion, please use ONLY the CHG-compatible lotions on the back of this paper.     Additional instructions for the day of surgery: DO NOT APPLY any lotions, deodorants, cologne, or perfumes.   Put on clean/comfortable clothes.  Brush your teeth.  Ask your nurse before applying any prescription medications to the skin.      CHG Compatible Lotions   Aveeno Moisturizing lotion  Cetaphil Moisturizing Cream  Cetaphil Moisturizing Lotion  Clairol Herbal Essence Moisturizing Lotion, Dry Skin  Clairol Herbal Essence Moisturizing Lotion, Extra Dry Skin  Clairol Herbal Essence Moisturizing Lotion, Normal Skin  Curel Age Defying Therapeutic Moisturizing Lotion with Alpha Hydroxy  Curel Extreme Care Body Lotion  Curel Soothing Hands Moisturizing Hand Lotion  Curel Therapeutic Moisturizing Cream, Fragrance-Free  Curel Therapeutic Moisturizing Lotion, Fragrance-Free  Curel Therapeutic Moisturizing Lotion, Original Formula  Eucerin Daily Replenishing Lotion  Eucerin Dry Skin Therapy Plus Alpha Hydroxy Crme  Eucerin Dry Skin Therapy Plus Alpha Hydroxy Lotion  Eucerin Original Crme  Eucerin Original Lotion  Eucerin Plus Crme Eucerin Plus Lotion  Eucerin TriLipid Replenishing Lotion  Keri Anti-Bacterial Hand Lotion  Keri Deep Conditioning Original Lotion Dry Skin Formula Softly Scented  Keri Deep Conditioning Original Lotion, Fragrance Free Sensitive Skin Formula  Keri Lotion Fast Absorbing Fragrance Free Sensitive Skin Formula  Keri Lotion Fast Absorbing Softly Scented Dry Skin Formula  Keri Original Lotion  Keri Skin Renewal Lotion Keri Silky Smooth Lotion  Keri Silky Smooth Sensitive Skin Lotion  Nivea Body Creamy Conditioning Oil  Nivea Body Extra Enriched Lotion  Nivea Body Original Lotion  Nivea Body Sheer Moisturizing Lotion Nivea Crme  Nivea Skin Firming Lotion  NutraDerm 30 Skin Lotion   NutraDerm Skin Lotion  NutraDerm Therapeutic Skin Cream  NutraDerm Therapeutic Skin Lotion  ProShield Protective Hand Cream   [place]- Preparing for Total Shoulder Arthroplasty    Before surgery, you can play an important role. Because skin is not sterile, your skin needs to be as free of germs as possible. You can reduce the number of germs on your skin by using the following products.  Benzoyl Peroxide Gel Reduces the number of germs present on the skin Applied twice a day to shoulder area starting two days before surgery    ==================================================================  Please follow these instructions carefully:  BENZOYL PEROXIDE 5% GEL  Please do not use if you have an allergy to benzoyl peroxide.   If your skin becomes reddened/irritated stop using the benzoyl peroxide.  Starting two days before surgery, apply as follows: Apply benzoyl peroxide in the morning and at night. Apply after taking a shower. If you are not taking a shower clean entire shoulder front, back, and side along with the armpit with a clean wet washcloth.  Place a quarter-sized dollop on your shoulder and rub in thoroughly, making  sure to cover the front, back, and side of your shoulder, along with the armpit.   2 days before ____ AM   ____ PM              1 day before ____ AM   ____ PM                         Do this twice a day for two days.  (Last application is the night before surgery, AFTER using the CHG soap as described below).  Do NOT apply benzoyl peroxide gel on the day of surgery.   Incentive Spirometer  An incentive spirometer is a tool that can help keep your lungs clear and active. This tool measures how well you are filling your lungs with each breath. Taking long deep breaths may help reverse or decrease the chance of developing breathing (pulmonary) problems (especially infection) following: A long period of time when you are unable to move or be active. BEFORE  THE PROCEDURE  If the spirometer includes an indicator to show your best effort, your nurse or respiratory therapist will set it to a desired goal. If possible, sit up straight or lean slightly forward. Try not to slouch. Hold the incentive spirometer in an upright position. INSTRUCTIONS FOR USE  Sit on the edge of your bed if possible, or sit up as far as you can in bed or on a chair. Hold the incentive spirometer in an upright position. Breathe out normally. Place the mouthpiece in your mouth and seal your lips tightly around it. Breathe in slowly and as deeply as possible, raising the piston or the ball toward the top of the column. Hold your breath for 3-5 seconds or for as long as possible. Allow the piston or ball to fall to the bottom of the column. Remove the mouthpiece from your mouth and breathe out normally. Rest for a few seconds and repeat Steps 1 through 7 at least 10 times every 1-2 hours when you are awake. Take your time and take a few normal breaths between deep breaths. The spirometer may include an indicator to show your best effort. Use the indicator as a goal to work toward during each repetition. After each set of 10 deep breaths, practice coughing to be sure your lungs are clear. If you have an incision (the cut made at the time of surgery), support your incision when coughing by placing a pillow or rolled up towels firmly against it. Once you are able to get out of bed, walk around indoors and cough well. You may stop using the incentive spirometer when instructed by your caregiver.  RISKS AND COMPLICATIONS Take your time so you do not get dizzy or light-headed. If you are in pain, you may need to take or ask for pain medication before doing incentive spirometry. It is harder to take a deep breath if you are having pain. AFTER USE Rest and breathe slowly and easily. It can be helpful to keep track of a log of your progress. Your caregiver can provide you with a simple  table to help with this. If you are using the spirometer at home, follow these instructions: SEEK MEDICAL CARE IF:  You are having difficultly using the spirometer. You have trouble using the spirometer as often as instructed. Your pain medication is not giving enough relief while using the spirometer. You develop fever of 100.5 F (38.1 C) or higher. SEEK IMMEDIATE MEDICAL CARE IF:  You cough up bloody sputum that had not been present before. You develop fever of 102 F (38.9 C) or greater. You develop worsening pain at or near the incision site. MAKE SURE YOU:  Understand these instructions. Will watch your condition. Will get help right away if you are not doing well or get worse. Document Released: 09/03/2006 Document Revised: 07/16/2011 Document Reviewed: 11/04/2006 [facility] Patient Information 2014 [place], [place].   ________________________________________________________________________

## 2023-07-11 ENCOUNTER — Encounter (HOSPITAL_COMMUNITY): Payer: Self-pay

## 2023-07-11 ENCOUNTER — Other Ambulatory Visit: Payer: Self-pay

## 2023-07-11 ENCOUNTER — Encounter (HOSPITAL_COMMUNITY)
Admission: RE | Admit: 2023-07-11 | Discharge: 2023-07-11 | Disposition: A | Discharge status: Home / Self Care | Payer: Medicare Other | Source: Ambulatory Visit | Attending: Orthopedic Surgery | Admitting: Orthopedic Surgery

## 2023-07-11 ENCOUNTER — Other Ambulatory Visit (HOSPITAL_COMMUNITY): Payer: Self-pay

## 2023-07-11 ENCOUNTER — Ambulatory Visit (HOSPITAL_COMMUNITY)
Admission: RE | Admit: 2023-07-11 | Discharge: 2023-07-11 | Disposition: A | Discharge status: Home / Self Care | Source: Ambulatory Visit | Attending: Orthopedic Surgery | Admitting: Orthopedic Surgery

## 2023-07-11 VITALS — BP 124/79 | HR 62 | Temp 98.7°F | Resp 16 | Ht 71.0 in | Wt 217.0 lb

## 2023-07-11 DIAGNOSIS — R82998 Other abnormal findings in urine: Secondary | ICD-10-CM | POA: Diagnosis not present

## 2023-07-11 DIAGNOSIS — Z01818 Encounter for other preprocedural examination: Secondary | ICD-10-CM | POA: Insufficient documentation

## 2023-07-11 DIAGNOSIS — M19011 Primary osteoarthritis, right shoulder: Secondary | ICD-10-CM | POA: Diagnosis not present

## 2023-07-11 DIAGNOSIS — Z8673 Personal history of transient ischemic attack (TIA), and cerebral infarction without residual deficits: Secondary | ICD-10-CM | POA: Insufficient documentation

## 2023-07-11 DIAGNOSIS — I480 Paroxysmal atrial fibrillation: Secondary | ICD-10-CM | POA: Diagnosis not present

## 2023-07-11 DIAGNOSIS — Z0000 Encounter for general adult medical examination without abnormal findings: Secondary | ICD-10-CM | POA: Diagnosis not present

## 2023-07-11 DIAGNOSIS — I10 Essential (primary) hypertension: Secondary | ICD-10-CM | POA: Insufficient documentation

## 2023-07-11 HISTORY — DX: Sleep apnea, unspecified: G47.30

## 2023-07-11 HISTORY — DX: Malignant (primary) neoplasm, unspecified: C80.1

## 2023-07-11 HISTORY — DX: Cardiac murmur, unspecified: R01.1

## 2023-07-11 HISTORY — DX: Cardiac arrhythmia, unspecified: I49.9

## 2023-07-11 HISTORY — DX: Myoneural disorder, unspecified: G70.9

## 2023-07-11 HISTORY — DX: Personal history of urinary calculi: Z87.442

## 2023-07-11 HISTORY — DX: Unspecified osteoarthritis, unspecified site: M19.90

## 2023-07-11 LAB — BASIC METABOLIC PANEL
Anion gap: 7 (ref 5–15)
BUN: 19 mg/dL (ref 8–23)
CO2: 24 mmol/L (ref 22–32)
Calcium: 9.4 mg/dL (ref 8.9–10.3)
Chloride: 107 mmol/L (ref 98–111)
Creatinine, Ser: 1.11 mg/dL (ref 0.61–1.24)
GFR, Estimated: >60 mL/min (ref >60)
Glucose, Bld: 92 mg/dL (ref 70–99)
Potassium: 4 mmol/L (ref 3.5–5.1)
Sodium: 138 mmol/L (ref 135–145)

## 2023-07-11 LAB — CBC
HCT: 37.5 % — ABNORMAL LOW (ref 39.0–52.0)
Hemoglobin: 11.9 g/dL — ABNORMAL LOW (ref 13.0–17.0)
MCH: 28 pg (ref 26.0–34.0)
MCHC: 31.7 g/dL (ref 30.0–36.0)
MCV: 88.2 fL (ref 80.0–100.0)
Platelets: 322 10*3/uL (ref 150–400)
RBC: 4.25 MIL/uL (ref 4.22–5.81)
RDW: 19.7 % — ABNORMAL HIGH (ref 11.5–15.5)
WBC: 7.5 10*3/uL (ref 4.0–10.5)
nRBC: 2.7 % — ABNORMAL HIGH (ref 0.0–0.2)

## 2023-07-11 LAB — SURGICAL PCR SCREEN
MRSA, PCR: NEGATIVE
Staphylococcus aureus: NEGATIVE

## 2023-07-11 MED ORDER — APIXABAN 5 MG PO TABS
5.0000 mg | ORAL_TABLET | Freq: Two times a day (BID) | ORAL | 1 refills | Status: DC
  Filled 2023-07-11: qty 180, 90d supply, fill #0
  Filled 2023-10-09: qty 180, 90d supply, fill #1

## 2023-07-11 NOTE — Telephone Encounter (Signed)
 Prescription refill request for Eliquis received.  Indication: afib  Last office visit: [place], 06/03/2023 Scr:1.21, 04/22/2023 Age: 68 yo  Weight: 99.8 kg   Refill sent.

## 2023-07-12 NOTE — Anesthesia Preprocedure Evaluation (Addendum)
 Anesthesia Evaluation  Patient identified by MRN, date of birth, ID band Patient awake    Reviewed: Allergy & Precautions, H&P , NPO status , Patient's Chart, lab work & pertinent test results  Airway Mallampati: II   Neck ROM: full    Dental   Pulmonary sleep apnea    breath sounds clear to auscultation       Cardiovascular hypertension, + dysrhythmias Atrial Fibrillation  Rhythm:regular Rate:Normal     Neuro/Psych  Neuromuscular disease CVA    GI/Hepatic   Endo/Other    Renal/GU      Musculoskeletal  (+) Arthritis ,    Abdominal   Peds  Hematology   Anesthesia Other Findings   Reproductive/Obstetrics                             Anesthesia Physical Anesthesia Plan  ASA: 3  Anesthesia Plan: General   Post-op Pain Management: Regional block*   Induction: Intravenous  PONV Risk Score and Plan: 2 and Ondansetron, Dexamethasone, Midazolam and Treatment may vary due to age or medical condition  Airway Management Planned: Oral ETT  Additional Equipment:   Intra-op Plan:   Post-operative Plan: Extubation in OR  Informed Consent: I have reviewed the patients History and Physical, chart, labs and discussed the procedure including the risks, benefits and alternatives for the proposed anesthesia with the patient or authorized representative who has indicated his/her understanding and acceptance.     Dental advisory given  Plan Discussed with: CRNA, Anesthesiologist and Surgeon  Anesthesia Plan Comments: (See PAT note 07/11/2023)       Anesthesia Quick Evaluation

## 2023-07-12 NOTE — Progress Notes (Signed)
 Anesthesia Chart Review   Case: [id number] Date/Time: 07/18/23 0715   Procedure: ARTHROPLASTY, SHOULDER, TOTAL, REVERSE (Right: Shoulder)   Anesthesia type: Choice   Pre-op diagnosis: RIGHT SHOULDER ARTHROPATHY   Location: [facility] ROOM 07 / [facility]   Surgeons: Jones Broom, MD       DISCUSSION:68 y.o. never smoker with h/o HTN, sleep apnea, atrial fibrillation, stroke 2020, right shoulder OA scheduled for above procedure 3/13/20025 with Dr. Jones Broom.   Per cardiology preoperative evaluation 06/12/2023, " Preoperative Cardiovascular Risk Assessment: Right reverse total shoulder arthroplasty with Dr. Ave Filter.    Mr. Banta's perioperative risk of a major cardiac event is 0.9% according to the Revised Cardiac Risk Index (RCRI).  Therefore, he is at low risk for perioperative complications.   His functional capacity is excellent at 9.89 METs according to the Duke Activity Status Index (DASI). Recommendations: According to ACC/AHA guidelines, no further cardiovascular testing needed.  The patient may proceed to surgery at acceptable risk.   Antiplatelet and/or Anticoagulation Recommendations: Eliquis (Apixaban) can be held for 3 days prior to surgery.  Please resume post op when felt to be safe."  Pt reports last dose of Eliquis 07/14/2023.  VS: BP 124/79   Pulse 62   Temp 37.1 C (Oral)   Resp 16   Ht 5\' 11"  (1.803 m)   Wt 98.4 kg   SpO2 98%   BMI 30.27 kg/m   PROVIDERS: Garlan Fillers, MD is PCP   Primary Cardiologist:  Olga Millers, MD  LABS: Labs reviewed: Acceptable for surgery. (all labs ordered are listed, but only abnormal results are displayed)  Labs Reviewed  CBC - Abnormal; Notable for the following components:      Result Value   Hemoglobin 11.9 (*)    HCT 37.5 (*)    RDW 19.7 (*)    nRBC 2.7 (*)    All other components within normal limits  SURGICAL PCR SCREEN  BASIC METABOLIC PANEL     IMAGES:   EKG:   CV: Echo 09/07/2021 1. Left  ventricular ejection fraction, by estimation, is 55 to 60%. Left  ventricular ejection fraction by 3D volume is 58 %. The left ventricle has  normal function. The left ventricle has no regional wall motion  abnormalities. Left ventricular diastolic   parameters are consistent with Grade I diastolic dysfunction (impaired  relaxation). The average left ventricular global longitudinal strain is  -21.3 %. The global longitudinal strain is normal.   2. Right ventricular systolic function is normal. The right ventricular  size is mildly enlarged. Tricuspid regurgitation signal is inadequate for  assessing PA pressure.   3. The mitral valve is normal in structure. Trivial mitral valve  regurgitation. No evidence of mitral stenosis.   4. The aortic valve is normal in structure. Aortic valve regurgitation is  not visualized. No aortic stenosis is present.   5. Aortic dilatation noted. There is mild dilatation of the ascending  aorta, measuring 39 mm.  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   Diverticulosis of colon (without mention of hemorrhage)    Dysrhythmia    A fib   Heart murmur    slight  no issues   Hemorrhoids    History of kidney stones    Hypertension    IBS (irritable bowel syndrome)    Iron deficiency anemia due to chronic blood loss 09/26/2017  Irregular heartbeat    Neuromuscular disorder (HCC)    neuropathy legs and feet   Other and unspecified hyperlipidemia    Perirectal fistula    Polycythemia, secondary    Sleep apnea    mild cpap uses dental appliance   Stroke (HCC)    mild speech glich at times no other issues   Stye    blocker duct right eye lid    Past Surgical History:  Procedure Laterality Date   ATRIAL FIBRILLATION ABLATION N/A 04/13/2022   Procedure: ATRIAL FIBRILLATION ABLATION;  Surgeon: Regan Lemming, MD;  Location: [facility] INVASIVE CV LAB;  Service:  Cardiovascular;  Laterality: N/A;   CARPAL TUNNEL RELEASE Left 09/2022   RETINAL DETACHMENT SURGERY  2005   ROTATOR CUFF REPAIR  1995   rt.   VASECTOMY     1991-or 1992    MEDICATIONS:  acetaminophen (TYLENOL) 500 MG tablet   amitriptyline (ELAVIL) 25 MG tablet   apixaban (ELIQUIS) 5 MG TABS tablet   aspirin EC 81 MG EC tablet   diltiazem (CARDIZEM CD) 120 MG 24 hr capsule   diltiazem (CARDIZEM) 30 MG tablet   fluorouracil (EFUDEX) 5 % cream   gabapentin (NEURONTIN) 300 MG capsule   GEMTESA 75 MG TABS   hydrOXYzine (VISTARIL) 25 MG capsule   losartan (COZAAR) 100 MG tablet   Melatonin 10 MG TABS   Multiple Vitamin (MULTIVITAMIN WITH MINERALS) TABS tablet   propranolol ER (INDERAL LA) 160 MG SR capsule   rosuvastatin (CRESTOR) 20 MG tablet   ruxolitinib phosphate (JAKAFI) 10 MG tablet   tadalafil (CIALIS) 5 MG tablet   zolpidem (AMBIEN CR) 12.5 MG CR tablet   No current facility-administered medications for this encounter.      Jodell Cipro Ward, PA-C [facility] Pre-Surgical Testing [phone]

## 2023-07-17 DIAGNOSIS — C44311 Basal cell carcinoma of skin of nose: Secondary | ICD-10-CM | POA: Diagnosis not present

## 2023-07-17 DIAGNOSIS — Z872 Personal history of diseases of the skin and subcutaneous tissue: Secondary | ICD-10-CM | POA: Diagnosis not present

## 2023-07-17 DIAGNOSIS — L821 Other seborrheic keratosis: Secondary | ICD-10-CM | POA: Diagnosis not present

## 2023-07-17 DIAGNOSIS — D485 Neoplasm of uncertain behavior of skin: Secondary | ICD-10-CM | POA: Diagnosis not present

## 2023-07-18 ENCOUNTER — Other Ambulatory Visit: Payer: Self-pay

## 2023-07-18 ENCOUNTER — Ambulatory Visit (HOSPITAL_COMMUNITY): Admitting: Anesthesiology

## 2023-07-18 ENCOUNTER — Ambulatory Visit (HOSPITAL_COMMUNITY)
Admission: RE | Admit: 2023-07-18 | Discharge: 2023-07-18 | Disposition: A | Discharge status: Home / Self Care | Payer: Medicare Other | Source: Ambulatory Visit | Attending: Orthopedic Surgery | Admitting: Orthopedic Surgery

## 2023-07-18 ENCOUNTER — Encounter (HOSPITAL_COMMUNITY): Payer: Self-pay | Admitting: Orthopedic Surgery

## 2023-07-18 ENCOUNTER — Ambulatory Visit (HOSPITAL_COMMUNITY): Payer: Self-pay | Admitting: Physician Assistant

## 2023-07-18 ENCOUNTER — Encounter (HOSPITAL_COMMUNITY)
Admission: RE | Disposition: A | Discharge status: Home / Self Care | Payer: Self-pay | Source: Ambulatory Visit | Attending: Orthopedic Surgery

## 2023-07-18 DIAGNOSIS — Z96611 Presence of right artificial shoulder joint: Secondary | ICD-10-CM | POA: Diagnosis not present

## 2023-07-18 DIAGNOSIS — M19011 Primary osteoarthritis, right shoulder: Secondary | ICD-10-CM | POA: Diagnosis not present

## 2023-07-18 DIAGNOSIS — G4730 Sleep apnea, unspecified: Secondary | ICD-10-CM | POA: Insufficient documentation

## 2023-07-18 DIAGNOSIS — M12811 Other specific arthropathies, not elsewhere classified, right shoulder: Secondary | ICD-10-CM | POA: Diagnosis not present

## 2023-07-18 DIAGNOSIS — I4891 Unspecified atrial fibrillation: Secondary | ICD-10-CM | POA: Insufficient documentation

## 2023-07-18 DIAGNOSIS — I10 Essential (primary) hypertension: Secondary | ICD-10-CM

## 2023-07-18 DIAGNOSIS — I480 Paroxysmal atrial fibrillation: Secondary | ICD-10-CM | POA: Diagnosis not present

## 2023-07-18 DIAGNOSIS — Z7901 Long term (current) use of anticoagulants: Secondary | ICD-10-CM | POA: Diagnosis not present

## 2023-07-18 DIAGNOSIS — G8918 Other acute postprocedural pain: Secondary | ICD-10-CM | POA: Diagnosis not present

## 2023-07-18 HISTORY — PX: REVERSE SHOULDER ARTHROPLASTY: SHX5054

## 2023-07-18 SURGERY — ARTHROPLASTY, SHOULDER, TOTAL, REVERSE
Anesthesia: General | Site: Shoulder | Laterality: Right

## 2023-07-18 MED ORDER — ACETAMINOPHEN 500 MG PO TABS
ORAL_TABLET | ORAL | Status: AC
  Filled 2023-07-18: qty 2

## 2023-07-18 MED ORDER — CHLORHEXIDINE GLUCONATE 0.12 % MT SOLN
15.0000 mL | Freq: Once | OROMUCOSAL | Status: AC
  Administered 2023-07-18: 15 mL via OROMUCOSAL

## 2023-07-18 MED ORDER — EPHEDRINE 5 MG/ML INJ
INTRAVENOUS | Status: AC
  Filled 2023-07-18: qty 5

## 2023-07-18 MED ORDER — PHENYLEPHRINE HCL-NACL 20-0.9 MG/250ML-% IV SOLN
INTRAVENOUS | Status: AC
  Filled 2023-07-18: qty 500

## 2023-07-18 MED ORDER — ONDANSETRON HCL 4 MG/2ML IJ SOLN
INTRAMUSCULAR | Status: DC | PRN
  Administered 2023-07-18: 4 mg via INTRAVENOUS

## 2023-07-18 MED ORDER — DEXAMETHASONE SODIUM PHOSPHATE 10 MG/ML IJ SOLN
INTRAMUSCULAR | Status: AC
  Filled 2023-07-18: qty 1

## 2023-07-18 MED ORDER — PROPOFOL 10 MG/ML IV BOLUS
INTRAVENOUS | Status: DC | PRN
  Administered 2023-07-18: 200 mg via INTRAVENOUS

## 2023-07-18 MED ORDER — METHOCARBAMOL 500 MG PO TABS: 500.0000 mg | ORAL_TABLET | Freq: Four times a day (QID) | ORAL | 0 refills | Status: DC | PRN

## 2023-07-18 MED ORDER — 0.9 % SODIUM CHLORIDE (POUR BTL) OPTIME
TOPICAL | Status: DC | PRN
  Administered 2023-07-18: 1000 mL

## 2023-07-18 MED ORDER — OXYCODONE HCL 5 MG PO TABS
ORAL_TABLET | ORAL | Status: AC
  Administered 2023-07-18: 5 mg via ORAL
  Filled 2023-07-18: qty 1

## 2023-07-18 MED ORDER — FENTANYL CITRATE PF 50 MCG/ML IJ SOSY
25.0000 ug | PREFILLED_SYRINGE | INTRAMUSCULAR | Status: DC | PRN
Start: 2023-07-18 — End: 2023-07-18

## 2023-07-18 MED ORDER — ACETAMINOPHEN 500 MG PO TABS
1000.0000 mg | ORAL_TABLET | Freq: Four times a day (QID) | ORAL | Status: DC
  Administered 2023-07-18: 1000 mg via ORAL

## 2023-07-18 MED ORDER — BUPIVACAINE LIPOSOME 1.3 % IJ SUSP
INTRAMUSCULAR | Status: DC | PRN
  Administered 2023-07-18: 10 mL via PERINEURAL

## 2023-07-18 MED ORDER — LIDOCAINE HCL (PF) 2 % IJ SOLN
INTRAMUSCULAR | Status: DC | PRN
Start: 2023-07-18 — End: 2023-07-18
  Administered 2023-07-18: 60 mg via INTRADERMAL

## 2023-07-18 MED ORDER — ROCURONIUM BROMIDE 10 MG/ML (PF) SYRINGE
PREFILLED_SYRINGE | INTRAVENOUS | Status: DC | PRN
  Administered 2023-07-18: 50 mg via INTRAVENOUS

## 2023-07-18 MED ORDER — PHENYLEPHRINE 80 MCG/ML (10ML) SYRINGE FOR IV PUSH (FOR BLOOD PRESSURE SUPPORT)
PREFILLED_SYRINGE | INTRAVENOUS | Status: AC
  Filled 2023-07-18: qty 10

## 2023-07-18 MED ORDER — DEXAMETHASONE SODIUM PHOSPHATE 10 MG/ML IJ SOLN
INTRAMUSCULAR | Status: DC | PRN
  Administered 2023-07-18: 10 mg via INTRAVENOUS

## 2023-07-18 MED ORDER — OXYCODONE HCL 5 MG PO TABS
ORAL_TABLET | ORAL | Status: AC
  Filled 2023-07-18: qty 1

## 2023-07-18 MED ORDER — EPHEDRINE SULFATE-NACL 50-0.9 MG/10ML-% IV SOSY
PREFILLED_SYRINGE | INTRAVENOUS | Status: DC | PRN
  Administered 2023-07-18: 10 mg via INTRAVENOUS
  Administered 2023-07-18: 25 mg via INTRAVENOUS
  Administered 2023-07-18: 15 mg via INTRAVENOUS

## 2023-07-18 MED ORDER — METHOCARBAMOL 500 MG PO TABS
500.0000 mg | ORAL_TABLET | Freq: Four times a day (QID) | ORAL | Status: DC | PRN
  Administered 2023-07-18: 500 mg via ORAL

## 2023-07-18 MED ORDER — MIDAZOLAM HCL 5 MG/5ML IJ SOLN
INTRAMUSCULAR | Status: DC | PRN
  Administered 2023-07-18: 2 mg via INTRAVENOUS

## 2023-07-18 MED ORDER — ROCURONIUM BROMIDE 10 MG/ML (PF) SYRINGE
PREFILLED_SYRINGE | INTRAVENOUS | Status: AC
  Filled 2023-07-18: qty 10

## 2023-07-18 MED ORDER — OXYCODONE HCL 5 MG PO TABS
5.0000 mg | ORAL_TABLET | ORAL | 0 refills | Status: DC | PRN
Start: 2023-07-18 — End: 2023-08-29

## 2023-07-18 MED ORDER — PHENYLEPHRINE HCL-NACL 20-0.9 MG/250ML-% IV SOLN
INTRAVENOUS | Status: DC | PRN
  Administered 2023-07-18: 50 ug/min via INTRAVENOUS

## 2023-07-18 MED ORDER — CEFAZOLIN SODIUM-DEXTROSE 2-4 GM/100ML-% IV SOLN
2.0000 g | INTRAVENOUS | Status: AC
  Administered 2023-07-18: 2 g via INTRAVENOUS
  Filled 2023-07-18: qty 100

## 2023-07-18 MED ORDER — TRANEXAMIC ACID-NACL 1000-0.7 MG/100ML-% IV SOLN
1000.0000 mg | INTRAVENOUS | Status: AC
  Administered 2023-07-18: 1000 mg via INTRAVENOUS
  Filled 2023-07-18: qty 100

## 2023-07-18 MED ORDER — METHOCARBAMOL 500 MG PO TABS
ORAL_TABLET | ORAL | Status: AC
  Filled 2023-07-18: qty 1

## 2023-07-18 MED ORDER — LACTATED RINGERS IV SOLN: INTRAVENOUS | Status: DC

## 2023-07-18 MED ORDER — METHOCARBAMOL 1000 MG/10ML IJ SOLN: 500.0000 mg | Freq: Four times a day (QID) | INTRAMUSCULAR | Status: DC | PRN

## 2023-07-18 MED ORDER — BUPIVACAINE HCL (PF) 0.5 % IJ SOLN
INTRAMUSCULAR | Status: DC | PRN
  Administered 2023-07-18: 15 mL via PERINEURAL

## 2023-07-18 MED ORDER — FENTANYL CITRATE (PF) 100 MCG/2ML IJ SOLN
INTRAMUSCULAR | Status: DC | PRN
  Administered 2023-07-18: 100 ug via INTRAVENOUS

## 2023-07-18 MED ORDER — SUGAMMADEX SODIUM 200 MG/2ML IV SOLN
INTRAVENOUS | Status: DC | PRN
  Administered 2023-07-18: 200 mg via INTRAVENOUS

## 2023-07-18 MED ORDER — FENTANYL CITRATE (PF) 100 MCG/2ML IJ SOLN
INTRAMUSCULAR | Status: AC
  Filled 2023-07-18: qty 2

## 2023-07-18 MED ORDER — SODIUM CHLORIDE 0.9 % IR SOLN
Status: DC | PRN
  Administered 2023-07-18: 1000 mL

## 2023-07-18 MED ORDER — ONDANSETRON HCL 4 MG/2ML IJ SOLN: 4.0000 mg | Freq: Four times a day (QID) | INTRAMUSCULAR | Status: DC | PRN

## 2023-07-18 MED ORDER — OXYCODONE HCL 5 MG PO TABS
5.0000 mg | ORAL_TABLET | ORAL | Status: DC | PRN
  Administered 2023-07-18: 5 mg via ORAL

## 2023-07-18 MED ORDER — PHENYLEPHRINE 80 MCG/ML (10ML) SYRINGE FOR IV PUSH (FOR BLOOD PRESSURE SUPPORT)
PREFILLED_SYRINGE | INTRAVENOUS | Status: DC | PRN
  Administered 2023-07-18 (×2): 160 ug via INTRAVENOUS

## 2023-07-18 MED ORDER — ORAL CARE MOUTH RINSE: 15.0000 mL | Freq: Once | OROMUCOSAL | Status: AC

## 2023-07-18 MED ORDER — ONDANSETRON HCL 4 MG/2ML IJ SOLN
INTRAMUSCULAR | Status: AC
  Filled 2023-07-18: qty 2

## 2023-07-18 MED ORDER — PROPOFOL 10 MG/ML IV BOLUS
INTRAVENOUS | Status: AC
  Filled 2023-07-18: qty 20

## 2023-07-18 MED ORDER — MIDAZOLAM HCL 2 MG/2ML IJ SOLN
INTRAMUSCULAR | Status: AC
  Filled 2023-07-18: qty 2

## 2023-07-18 MED ORDER — LIDOCAINE HCL (PF) 2 % IJ SOLN
INTRAMUSCULAR | Status: AC
  Filled 2023-07-18: qty 5

## 2023-07-18 SURGICAL SUPPLY — 70 items
BAG COUNTER SPONGE SURGICOUNT (BAG) IMPLANT
BAG ZIPLOCK 12X15 (MISCELLANEOUS) ×1 IMPLANT
BASEPLATE P2 COATD GLND 6.5X30 (Shoulder) IMPLANT
BIT DRILL 1.6MX128 (BIT) IMPLANT
BIT DRILL 2.5 DIA 127 CALI (BIT) IMPLANT
BIT DRILL 4 DIA CALIBRATED (BIT) IMPLANT
BLADE SAW SGTL 73X25 THK (BLADE) ×1 IMPLANT
BOOTIES KNEE HIGH SLOAN (MISCELLANEOUS) ×2 IMPLANT
COOLER ICEMAN CLASSIC (MISCELLANEOUS) ×1 IMPLANT
COVER BACK TABLE 60X90IN (DRAPES) ×1 IMPLANT
COVER SURGICAL LIGHT HANDLE (MISCELLANEOUS) ×1 IMPLANT
DRAPE INCISE IOBAN 66X45 STRL (DRAPES) ×1 IMPLANT
DRAPE POUCH INSTRU U-SHP 10X18 (DRAPES) ×1 IMPLANT
DRAPE SHEET LG 3/4 BI-LAMINATE (DRAPES) ×1 IMPLANT
DRAPE SURG 17X11 SM STRL (DRAPES) ×1 IMPLANT
DRAPE SURG ORHT 6 SPLT 77X108 (DRAPES) ×2 IMPLANT
DRAPE TOP 10253 STERILE (DRAPES) ×1 IMPLANT
DRAPE U-SHAPE 47X51 STRL (DRAPES) ×1 IMPLANT
DRSG AQUACEL AG ADV 3.5X 6 (GAUZE/BANDAGES/DRESSINGS) ×1 IMPLANT
DURAPREP 26ML APPLICATOR (WOUND CARE) ×2 IMPLANT
ELECT BLADE TIP CTD 4 INCH (ELECTRODE) ×1 IMPLANT
ELECT REM PT RETURN 15FT ADLT (MISCELLANEOUS) ×1 IMPLANT
FACESHIELD WRAPAROUND (MASK) ×1 IMPLANT
FACESHIELD WRAPAROUND OR TEAM (MASK) ×1 IMPLANT
GLOVE BIO SURGEON STRL SZ7.5 (GLOVE) ×1 IMPLANT
GLOVE BIOGEL PI IND STRL 6.5 (GLOVE) ×1 IMPLANT
GLOVE BIOGEL PI IND STRL 8 (GLOVE) ×1 IMPLANT
GLOVE SURG SS PI 6.5 STRL IVOR (GLOVE) ×1 IMPLANT
GOWN STRL REUS W/ TWL LRG LVL3 (GOWN DISPOSABLE) ×1 IMPLANT
GOWN STRL REUS W/ TWL XL LVL3 (GOWN DISPOSABLE) ×1 IMPLANT
HEAD GLENOID W/SCREW 32MM (Shoulder) IMPLANT
HOOD PEEL AWAY T7 (MISCELLANEOUS) ×3 IMPLANT
INSERT EPOLY STND HUMERUS 36MM (Shoulder) ×1 IMPLANT
INSERT EPOLYSTD HUMERUS 36MM (Shoulder) IMPLANT
KIT BASIN OR (CUSTOM PROCEDURE TRAY) ×1 IMPLANT
KIT TURNOVER KIT A (KITS) IMPLANT
MANIFOLD NEPTUNE II (INSTRUMENTS) ×1 IMPLANT
NDL TROCAR POINT SZ 2 1/2 (NEEDLE) IMPLANT
NEEDLE TROCAR POINT SZ 2 1/2 (NEEDLE) IMPLANT
NS IRRIG 1000ML POUR BTL (IV SOLUTION) ×1 IMPLANT
P2 COATDE GLNOID BSEPLT 6.5X30 (Shoulder) ×1 IMPLANT
PACK SHOULDER (CUSTOM PROCEDURE TRAY) ×1 IMPLANT
PAD COLD SHLDR WRAP-ON (PAD) ×1 IMPLANT
PROTECTOR NERVE ULNAR (MISCELLANEOUS) IMPLANT
RESTRAINT HEAD UNIVERSAL NS (MISCELLANEOUS) ×1 IMPLANT
RETRIEVER SUT HEWSON (MISCELLANEOUS) IMPLANT
SCREW BONE LOCKING RSP 5.0X30 (Screw) ×1 IMPLANT
SCREW BONE LOCKING RSP 5.0X34 (Screw) ×1 IMPLANT
SCREW BONE RSP LOCK 5X18 (Screw) IMPLANT
SCREW BONE RSP LOCK 5X30 (Screw) IMPLANT
SCREW BONE RSP LOCK 5X34 (Screw) IMPLANT
SCREW BONE RSP LOCKING 18MM LG (Screw) ×1 IMPLANT
SET HNDPC FAN SPRY TIP SCT (DISPOSABLE) ×1 IMPLANT
SLING ARM IMMOBILIZER LRG (SOFTGOODS) IMPLANT
SLING ARM IMMOBILIZER MED (SOFTGOODS) IMPLANT
STEM HUMERAL STD SHELL 14X48 (Miscellaneous) IMPLANT
STRIP CLOSURE SKIN 1/2X4 (GAUZE/BANDAGES/DRESSINGS) ×1 IMPLANT
SUCTION TUBE FRAZIER 10FR DISP (SUCTIONS) IMPLANT
SUPPORT WRAP ARM LG (MISCELLANEOUS) ×1 IMPLANT
SUT ETHIBOND 2 V 37 (SUTURE) IMPLANT
SUT FIBERWIRE #2 38 REV NDL BL (SUTURE) IMPLANT
SUT MNCRL AB 4-0 PS2 18 (SUTURE) ×1 IMPLANT
SUT VIC AB 2-0 CT1 TAPERPNT 27 (SUTURE) ×2 IMPLANT
SUTURE FIBERWR#2 38 REV NDL BL (SUTURE) IMPLANT
TAP SURG THRD DJ 6.5 (ORTHOPEDIC DISPOSABLE SUPPLIES) IMPLANT
TAPE LABRALWHITE 1.5X36 (TAPE) IMPLANT
TAPE SUT LABRALTAP WHT/BLK (SUTURE) IMPLANT
TOWEL OR 17X26 10 PK STRL BLUE (TOWEL DISPOSABLE) ×1 IMPLANT
TUBE SUCTION HIGH CAP CLEAR NV (SUCTIONS) IMPLANT
WATER STERILE IRR 1000ML POUR (IV SOLUTION) ×1 IMPLANT

## 2023-07-18 NOTE — Evaluation (Addendum)
 Occupational Therapy Evaluation Patient Details Name: [name] [name] MRN: [id number] DOB: 03-19-1956 Today's Date: 07/18/2023   History of Present Illness   Pt is a 68 yo male s/p R Reverse TSA. PMH: OA, Ca, Afib, HTN, IBS, PN, hx of stroke     Clinical Impressions PTA pt lives at home with wife and was independent with all aspects of A/IADL's and driving.  Education completed regarding compensatory strategies for ADL tasks and functional mobility, management of sling, R ROM per specified parameters in the order set as indicated below, positioning of operative arm in sitting and supine and edema control, including use of "Iceman" Cold Therapy machine. Caregiver present for education, written handouts provided and reviewed using Teach Back and pt/caregiver verbalized/demonstrated understanding. Due to the below listed deficits, pt requires min assistance with ADL tasks and mod I with functional mobility. Pt to follow up with MD to progress rehab of the operative shoulder. TO NOTE: Pt's wife brought to ED as outlined below and pt was handed off to PACU nurses for continued instruction. May continue to need skilled OT if patient remains in hospital due to wife and caregiver issues. Since caregiver was taken to ED following education and training, patient was calling neighbor for assistance and nursing handoff. Patient reported he was unsure who will be able to provide necessary level of assistance at discharge so will remain on OT services if needed for follow up in Acute setting.       If plan is discharge home, recommend the following:   A little help with walking and/or transfers;A little help with bathing/dressing/bathroom;Assistance with cooking/housework;Direct supervision/assist for medications management;Direct supervision/assist for financial management;Assist for transportation;Help with stairs or ramp for entrance     Functional Status Assessment   Patient has had a recent decline in  their functional status and demonstrates the ability to make significant improvements in function in a reasonable and predictable amount of time.     Equipment Recommendations   None recommended by OT      Precautions/Restrictions   Precautions Precautions: Shoulder Required Braces or Orthoses: Sling Restrictions Weight Bearing Restrictions Per Provider Order: No Other Position/Activity Restrictions:  (R sling)  No shoulder ROM allowed; elbow/wrist/hand AROM only.    Mobility   Transfers Overall transfer level: Independent Equipment used: None                      Balance Overall balance assessment: No apparent balance deficits (not formally assessed)                                         ADL either performed or assessed with clinical judgement   ADL Overall ADL's : Needs assistance/impaired    Per orders, R shoulder parameters as follows for ADL tasks: No shoulder ROM; elbow/wrist/hand ROM only. While moving within specified parameters, pt/caregiver instructed on bathing and how to donn/doff shirt, placing operative arm through sleeve first when donning and off last when doffing.Pt/caregiver educated on compensatory strategies for LB ADL and strategies to reduce risk of falls.  Pt educated on donning/doffing sling and to wear the sling at all times with the exception of ADL, and to loosen the neck strap of the sling when the operative arm is in a supported position when sitting. In sitting or supine, pt instructed to have a pillow behind and under their operative arm to provide support.  Education regarding use of "IceMan" Cold Therapy completed, including the importance of using a barrier on the shoulder prior to positioning the wrap-on pad. Pt/caregiver verbalized/demonstrated understanding. Teach Back used while caregiver assisted with dressing. Pt's wife brought to ED as outlined below and pt was handed off to PACU nurses for continued  instruction. May continue to need skilled OT if patient remains in hospital due to wife and caregiver uncertainty.                                           Vision Baseline Vision/History: 1 Wears glasses Ability to See in Adequate Light: 0 Adequate Patient Visual Report: No change from baseline Vision Assessment?: No apparent visual deficits     Perception Perception: Within Functional Limits       Praxis Praxis: WFL       Pertinent Vitals/Pain Pain Assessment Pain Assessment: 0-10 Pain Score: 6  Pain Location:  (R axilla) Pain Descriptors / Indicators: Dull Pain Intervention(s): Limited activity within patient's tolerance, Monitored during session, Repositioned, Premedicated before session, Relaxation, Ice applied     Extremity/Trunk Assessment Upper Extremity Assessment Upper Extremity Assessment: Left hand dominant;RUE deficits/detail RUE Deficits / Details:  (post op R Reverse TSA see details) RUE: Unable to fully assess due to immobilization RUE Sensation: decreased light touch RUE Coordination: decreased gross motor   Lower Extremity Assessment Lower Extremity Assessment: Overall WFL for tasks assessed   Cervical / Trunk Assessment Cervical / Trunk Assessment: Normal   Communication Communication Communication: No apparent difficulties   Cognition Arousal: Alert Behavior During Therapy: WFL for tasks assessed/performed Cognition: No apparent impairments                               Following commands: Intact       Cueing  General Comments   Cueing Techniques: Verbal cues  post op R sh dressing intact   Exercises Exercises: Other exercises (see listed allowable R distal mvmnt)   Shoulder Instructions  See above     Home Living Family/patient expects to be discharged to:: Private residence Living Arrangements: Spouse/significant other Available Help at Discharge: Family Type of Home: House Home Access: Stairs to  enter Secretary/administrator of Steps: 2   Home Layout: Multi-level;Able to live on main level with bedroom/bathroom Alternate Level Stairs-Number of Steps: FF   Bathroom Shower/Tub: Estate manager/land agent Accessibility: Yes   Home Equipment: None          Prior Functioning/Environment Prior Level of Function : Independent/Modified Independent                    OT Problem List: Impaired UE functional use;Pain        OT Goals(Current goals can be found in the care plan section)   Acute Rehab OT Goals Patient Stated Goal: to go home OT Goal Formulation: With patient/family Time For Goal Achievement: 07/18/23 Potential to Achieve Goals: Good   AM-PAC OT "6 Clicks" Daily Activity     Outcome Measure Help from another person eating meals?: A Little Help from another person taking care of personal grooming?: A Little Help from another person toileting, which includes using toliet, bedpan, or urinal?: A Little Help from another person bathing (including washing, rinsing, drying)?: A Little Help from another person to put on  and taking off regular upper body clothing?: A Little Help from another person to put on and taking off regular lower body clothing?: A Little 6 Click Score: 18   End of Session Equipment Utilized During Treatment:  (none) Nurse Communication: Mobility status;Precautions;Other (comment) (wife had what appeared to be vagal episode and low BP and was sent to ED during session, patient left in pacu bay with all needs and nursing assisting, calling for neighbor support and all handouts and ice provided)  Activity Tolerance: Patient tolerated treatment well Patient left: in chair;with call bell/phone within reach  OT Visit Diagnosis: Pain Pain - Right/Left: Right Pain - part of body: Arm                Time: 1040-1100 OT Time Calculation (min): 20 min Charges:  OT General Charges $OT Visit: 1 Visit OT Evaluation $OT Eval Low Complexity: 1  Low  Dail Meece OT/L Acute Rehabilitation Department  [phone]   Vicenta Dunning 07/18/2023, 11:49 AM

## 2023-07-18 NOTE — Transfer of Care (Signed)
 Immediate Anesthesia Transfer of Care Note  Patient: Vicente Masson  Procedure(s) Performed: ARTHROPLASTY, SHOULDER, TOTAL, REVERSE (Right: Shoulder)  Patient Location: PACU  Anesthesia Type:General  Level of Consciousness: sedated  Airway & Oxygen Therapy: Patient Spontanous Breathing and Patient connected to face mask oxygen  Post-op Assessment: Report given to RN and Post -op Vital signs reviewed and stable  Post vital signs: Reviewed and stable  Last Vitals:  Vitals Value Taken Time  BP 117/81 07/18/23 0853  Temp    Pulse 57 07/18/23 0854  Resp 18 07/18/23 0854  SpO2 97 % 07/18/23 0854  Vitals shown include unfiled device data.  Last Pain:  Vitals:   07/18/23 0631  TempSrc:   PainSc: 0-No pain      Patients Stated Pain Goal: 3 (07/18/23 0631)  Complications: No notable events documented.

## 2023-07-18 NOTE — H&P (Signed)
 [name] [name] is an 68 y.o. male.   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  Pain interferes with sleep and quality of life.   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   Diverticulosis of colon (without mention of hemorrhage)    Dysrhythmia    A fib   Heart murmur    slight  no issues   Hemorrhoids    History of kidney stones    Hypertension    IBS (irritable bowel syndrome)    Iron deficiency anemia due to chronic blood loss 09/26/2017   Irregular heartbeat    Neuromuscular disorder (HCC)    neuropathy legs and feet   Other and unspecified hyperlipidemia    Perirectal fistula    Polycythemia, secondary    Sleep apnea    mild cpap uses dental appliance   Stroke (HCC)    mild speech glich at times no other issues   Stye    blocker duct right eye lid    Past Surgical History:  Procedure Laterality Date   ATRIAL FIBRILLATION ABLATION N/A 04/13/2022   Procedure: ATRIAL FIBRILLATION ABLATION;  Surgeon: Regan Lemming, MD;  Location: [facility];  Service: Cardiovascular;  Laterality: N/A;   CARPAL TUNNEL RELEASE Left 09/2022   RETINAL DETACHMENT SURGERY  2005   ROTATOR CUFF REPAIR  1995   rt.   VASECTOMY     1991-or 1992    Family History  Problem Relation Age of Onset   Aortic aneurysm Father    Heart attack Brother    Cerebral palsy Brother    Colon cancer Neg Hx    Esophageal cancer Neg Hx    Pancreatic cancer Neg Hx    Prostate cancer Neg Hx    Rectal cancer Neg Hx    Stomach cancer Neg Hx    Social History:  reports that he has never smoked. He has never used smokeless tobacco. He reports current alcohol use of about 2.0 standard drinks of alcohol per week. He reports that he does not use drugs.  Allergies:  Allergies  Allergen  Reactions   Trazodone Hcl Other (See Comments)    Other reaction(s): too sleepy in AM   Mirtazapine Other (See Comments)    Other reaction(s): too sleepy in the AM  Other Reaction(s): Other (See Comments)    Other reaction(s): too sleepy in the AM   Niacin Itching and Other (See Comments)    Flushed feeling  Other Reaction(s): Other (See Comments)    Flushed feeling    Medications Prior to Admission  Medication Sig Dispense Refill   acetaminophen (TYLENOL) 500 MG tablet Take 1,000 mg by mouth every 6 (six) hours as needed for mild pain.      amitriptyline (ELAVIL) 25 MG tablet Take 2 tablets (50 mg total) by mouth at bedtime. 180 tablet 3   aspirin EC 81 MG EC tablet Take 1 tablet (81 mg total) by mouth daily. (Patient taking differently: Take 81 mg by mouth at bedtime.) 60 tablet 0   diltiazem (CARDIZEM CD) 120 MG 24 hr capsule Take 1 capsule (120 mg total) by mouth daily. 90 capsule 3   gabapentin (NEURONTIN) 300 MG capsule Take 2 capsules (600 mg total) by mouth 2 (two) times daily. (Patient taking differently: Take 600-900 mg  by mouth See admin instructions. 600 mg in the morning, 900 mg at bedtime) 360 capsule 0   GEMTESA 75 MG TABS Take 1 tablet by mouth daily.     hydrOXYzine (VISTARIL) 25 MG capsule Take 25 mg by mouth at bedtime as needed (sleep).     losartan (COZAAR) 100 MG tablet Take 1 tablet (100 mg total) by mouth daily. (Patient taking differently: Take 100 mg by mouth at bedtime.) 90 tablet 3   Melatonin 10 MG TABS Take 10 mg by mouth at bedtime.     Multiple Vitamin (MULTIVITAMIN WITH MINERALS) TABS tablet Take 1 tablet by mouth in the morning.     propranolol ER (INDERAL LA) 160 MG SR capsule TAKE 1 CAPSULE BY MOUTH EVERY DAY (Patient taking differently: Take 160 mg by mouth at bedtime.) 90 capsule 3   rosuvastatin (CRESTOR) 20 MG tablet Take 1 tablet (20 mg total) by mouth daily. 90 tablet 3   ruxolitinib phosphate (JAKAFI) 10 MG tablet TAKE 1 TABLET BY MOUTH 2  TIMES DAILY. 60 tablet 4   tadalafil (CIALIS) 5 MG tablet Take 5 mg by mouth every other day. In the morning.     zolpidem (AMBIEN CR) 12.5 MG CR tablet Take 6.25 mg by mouth at bedtime as needed for sleep.     apixaban (ELIQUIS) 5 MG TABS tablet Take 1 tablet (5 mg total) by mouth 2 (two) times daily. 180 tablet 1   diltiazem (CARDIZEM) 30 MG tablet Take 1 tablet every 4 hours AS NEEDED for AFIB heart rate >100 as long as top BP >100. 30 tablet 1   fluorouracil (EFUDEX) 5 % cream Apply 1 application  topically 2 (two) times daily as needed (dry skin).      No results found for this or any previous visit (from the past 48 hours). No results found.  Review of Systems  All other systems reviewed and are negative.   Blood pressure 116/74, pulse 76, temperature 99.3 F (37.4 C), temperature source Oral, resp. rate 18, height 5\' 11"  (1.803 m), weight 98.4 kg, SpO2 95%. Physical Exam Constitutional:      Appearance: He is well-developed.  HENT:     Head: Atraumatic.  Eyes:     Extraocular Movements: Extraocular movements intact.  Cardiovascular:     Pulses: Normal pulses.  Pulmonary:     Effort: Pulmonary effort is normal.  Musculoskeletal:     Comments: R shoulder pain with limited ROM. NVID  Skin:    General: Skin is warm and dry.  Neurological:     Mental Status: He is alert and oriented to person, place, and time.  Psychiatric:        Mood and Affect: Mood normal.      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 Plan R reverse TSA Risks / benefits of surgery discussed Consent on chart  NPO for OR Preop antibiotics   Glennon Hamilton, MD 07/18/2023, 6:33 AM

## 2023-07-18 NOTE — Op Note (Signed)
 Procedure(s): ARTHROPLASTY, SHOULDER, TOTAL, REVERSE Procedure Note  [name] [name] male 68 y.o. 07/18/2023  Preoperative diagnosis: Right shoulder post capsulorrhaphy arthropathy  Postoperative diagnosis: Same  Procedure(s) and Anesthesia Type:    * ARTHROPLASTY, SHOULDER, TOTAL, REVERSE - Choice   Indications:  68 y.o. male  With endstage right shoulder arthritis with history of open capsulorrhaphy with violation of the subscapularis.  Pain and dysfunction interfered with quality of life and nonoperative treatment with activity modification, NSAIDS and injections failed.     Surgeon: Glennon Hamilton   Assistants: Fredia Sorrow PA-C Amber was present and scrubbed throughout the procedure and was essential in positioning, retraction, exposure, and closure)  Anesthesia: General endotracheal anesthesia with preoperative interscalene block given by the attending anesthesiologist     Procedure Detail  ARTHROPLASTY, SHOULDER, TOTAL, REVERSE   Estimated Blood Loss:  200 mL         Drains: none  Blood Given: none          Specimens: none        Complications:  * No complications entered in OR log *         Disposition: PACU - hemodynamically stable.         Condition: stable      OPERATIVE FINDINGS:  A DJO Altivate pressfit reverse total shoulder arthroplasty was placed with a  size 14 stem, a 36 standard glenosphere, and a standard-mm poly insert. The base plate  fixation was excellent.  Anterior screw was not used secondary to interference with the previous metallic anchors.  PROCEDURE: The patient was identified in the preoperative holding area  where I personally marked the operative site after verifying site, side,  and procedure with the patient. An interscalene block given by  the attending anesthesiologist in the holding area and the patient was taken back to the operating room where all extremities were  carefully padded in position after general  anesthesia was induced. She  was placed in a beach-chair position and the operative upper extremity was  prepped and draped in a standard sterile fashion. An approximately 10-  cm incision was made from the tip of the coracoid process to the center  point of the humerus at the level of the axilla. Dissection was carried  down through subcutaneous tissues to the level of the cephalic vein  which was taken laterally with the deltoid. The pectoralis major was  retracted medially. The subdeltoid space was developed and the lateral  edge of the conjoined tendon was identified. The undersurface of  conjoined tendon was palpated and the musculocutaneous nerve was not in  the field. Retractor was placed underneath the conjoined and second  retractor was placed lateral into the deltoid. The circumflex humeral  artery and vessels were identified and clamped and coagulated. The  biceps tendon was tenotomized.  The subscapularis was taken down as a peel with the underlying capsule.  This tissue was very stiff and fibrotic..  The  joint was then gently externally rotated while the capsule was released  from the humeral neck around to just beyond the 6 o'clock position. At  this point, the joint was dislocated and the humeral head was presented  into the wound. The excessive osteophyte formation was removed with a  large rongeur.  The cutting guide was used to make the appropriate  head cut and the head was saved for potentially bone grafting.  The glenoid was exposed with the arm in an  abducted extended position. The anterior and  posterior labrum were  completely excised and the capsule was released circumferentially to  allow for exposure of the glenoid for preparation. The 2.5 mm drill was  placed using the guide in 5-10 inferior angulation and the tap was then advanced in the same hole. Small and large reamers were then used. The tap was then removed and the Metaglene was then screwed in with  excellent purchase.  The peripheral guide was then used to drilled measured and filled peripheral locking screws.  The anterior screw was not placed secondary to interference from the metallic anchors in the glenoid.  The size 36 standard glenosphere was then impacted on the [facility] taper and the central screw was placed. The humerus was then again exposed and the diaphyseal reamers were used followed by the metaphyseal reamers. The final broach was left in place in the proximal trial was placed. The joint was reduced and with this implant it was felt that soft tissue tensioning was appropriate with excellent stability and excellent range of motion. Therefore, final humeral stem was placed press-fit.  And then the trial polyethylene inserts were tested again and the above implant was felt to be the most appropriate for final insertion. The joint was reduced taken through full range of motion and felt to be stable. Soft tissue tension was appropriate.  The joint was then copiously irrigated with pulse  lavage and the wound was then closed. The subscapularis was not repaired.  Skin was closed with 2-0 Vicryl in a deep dermal layer and 4-0  Monocryl for skin closure. Steri-Strips were applied. Sterile  dressings were then applied as well as a sling. The patient was allowed  to awaken from general anesthesia, transferred to stretcher, and taken  to recovery room in stable condition.   POSTOPERATIVE PLAN: The patient will be observed in the recovery room and if his pain is well-controlled with regional anesthesia and he is hemodynamically stable he can be discharged home today with family.

## 2023-07-18 NOTE — Anesthesia Procedure Notes (Signed)
 Anesthesia Regional Block: Interscalene brachial plexus block   Pre-Anesthetic Checklist: , timeout performed,  Correct Patient, Correct Site, Correct Laterality,  Correct Procedure, Correct Position, site marked,  Risks and benefits discussed,  Surgical consent,  Pre-op evaluation,  At surgeon's request and post-op pain management  Laterality: Right  Prep: chloraprep       Needles:  Injection technique: Single-shot  Needle Type: Echogenic Stimulator Needle     Needle Length: 5cm  Needle Gauge: 22     Additional Needles:   Procedures:, nerve stimulator,,,,,     Nerve Stimulator or Paresthesia:  Response: biceps flexion, 0.45 mA  Additional Responses:   Narrative:  Start time: 07/18/2023 7:07 AM End time: 07/18/2023 7:13 AM Injection made incrementally with aspirations every 5 mL.  Performed by: Personally  Anesthesiologist: Achille Rich, MD  Additional Notes: Functioning IV was confirmed and monitors were applied.  A 50mm 22ga Arrow echogenic stimulator needle was used. Sterile prep and drape,hand hygiene and sterile gloves were used.  Negative aspiration and negative test dose prior to incremental administration of local anesthetic. The patient tolerated the procedure well.  Ultrasound guidance: relevent anatomy identified, needle position confirmed, local anesthetic spread visualized around nerve(s), vascular puncture avoided.  Image printed for medical record.

## 2023-07-18 NOTE — Discharge Instructions (Addendum)
 Discharge Instructions after Reverse Total Shoulder Arthroplasty   A sling has been provided for you. You are to wear this at all times (except for bathing and dressing), until your first post operative visit with Dr. Ave Filter. Please also wear while sleeping at night. While you bath and dress, let the arm/elbow extend straight down to stretch your elbow. Wiggle your fingers and pump your first while your in the sling to prevent hand swelling. Use ice on the shoulder intermittently over the first 48 hours after surgery. Continue to use ice or and ice machine as needed after 48 hours for pain control/swelling.  Pain medicine has been prescribed for you.  Use your medicine liberally over the first 48 hours, and then you can begin to taper your use. You may take Extra Strength Tylenol or Tylenol only in place of the pain pills. DO NOT take ANY nonsteroidal anti-inflammatory pain medications: Advil, Motrin, Ibuprofen, Aleve, Naproxen or Naprosyn.  Resume Eliquis and aspirin the day after surgery Leave your dressing on until your first follow up visit.  You may shower with the dressing.  Hold your arm as if you still have your sling on while you shower. Simply allow the water to wash over the site and then pat dry. Make sure your axilla (armpit) is completely dry after showering.    Please call [phone] during normal business hours or [phone] after hours for any problems. Including the following:  - excessive redness of the incisions - drainage for more than 4 days - fever of more than 101.5 F  *Please note that pain medications will not be refilled after hours or on weekends.    Dental Antibiotics:  In most cases prophylactic antibiotics for Dental procdeures after total joint surgery are not necessary.  Exceptions are as follows:  1. History of prior total joint infection  2. Severely immunocompromised (Organ Transplant, cancer chemotherapy, Rheumatoid biologic meds such as  Humera)  3. Poorly controlled diabetes (A1C &gt; 8.0, blood glucose over 200)  If you have one of these conditions, contact your surgeon for an antibiotic prescription, prior to your dental procedure.

## 2023-07-18 NOTE — Anesthesia Procedure Notes (Signed)
 Procedure Name: Intubation Date/Time: 07/18/2023 7:29 AM  Performed by: Doran Clay, CRNAPre-anesthesia Checklist: Patient identified, Emergency Drugs available, Suction available, Patient being monitored and Timeout performed Patient Re-evaluated:Patient Re-evaluated prior to induction Oxygen Delivery Method: Circle system utilized Preoxygenation: Pre-oxygenation with 100% oxygen Induction Type: IV induction Ventilation: Mask ventilation without difficulty Laryngoscope Size: 4 and Mac Grade View: Grade II Tube type: Oral Tube size: 7.5 mm Number of attempts: 1 Airway Equipment and Method: Stylet Placement Confirmation: ETT inserted through vocal cords under direct vision, positive ETCO2 and breath sounds checked- equal and bilateral Secured at: 22 cm Tube secured with: Tape Dental Injury: Teeth and Oropharynx as per pre-operative assessment

## 2023-07-18 NOTE — Progress Notes (Signed)
 While patient was working with OT, patients wife, Tysen Roesler, became dizzy and light headed. OT was present in the room and stated that patients wife, became dizzy, sat down on the bench, briefly lost consciousness, vomited and I was called to the room by OT. Patti's feet were elevated and blood pressure was taken, showing 67/23 and 67/48. Patients wife was placed on stretcher and transferred to the ED by myself and Berniece Pap. Report was given to [facility] in the ER. Patient is made aware that he cannot be released to wife driving and is is being released currently to family friend named [place].

## 2023-07-18 NOTE — Anesthesia Postprocedure Evaluation (Signed)
 Anesthesia Post Note  Patient: [name] [name]  Procedure(s) Performed: ARTHROPLASTY, SHOULDER, TOTAL, REVERSE (Right: Shoulder)     Patient location during evaluation: PACU Anesthesia Type: General and Regional Level of consciousness: awake and alert Pain management: pain level controlled Vital Signs Assessment: post-procedure vital signs reviewed and stable Respiratory status: spontaneous breathing, nonlabored ventilation, respiratory function stable and patient connected to nasal cannula oxygen Cardiovascular status: blood pressure returned to baseline and stable Postop Assessment: no apparent nausea or vomiting Anesthetic complications: no   No notable events documented.  Last Vitals:  Vitals:   07/18/23 1000 07/18/23 1045  BP: 94/72 92/70  Pulse: 63 60  Resp:    Temp:    SpO2: 96% 96%    Last Pain:  Vitals:   07/18/23 1034  TempSrc:   PainSc: 7                  [name]

## 2023-07-19 ENCOUNTER — Encounter (HOSPITAL_COMMUNITY): Payer: Self-pay | Admitting: Orthopedic Surgery

## 2023-07-22 ENCOUNTER — Inpatient Hospital Stay (HOSPITAL_BASED_OUTPATIENT_CLINIC_OR_DEPARTMENT_OTHER): Payer: Managed Care, Other (non HMO) | Admitting: Hematology & Oncology

## 2023-07-22 ENCOUNTER — Inpatient Hospital Stay: Payer: Medicare Other

## 2023-07-22 ENCOUNTER — Encounter: Payer: Self-pay | Admitting: Hematology & Oncology

## 2023-07-22 ENCOUNTER — Inpatient Hospital Stay: Payer: Medicare Other | Attending: Hematology & Oncology

## 2023-07-22 VITALS — BP 125/66 | HR 73 | Temp 98.2°F | Resp 20 | Ht 71.0 in | Wt 221.8 lb

## 2023-07-22 DIAGNOSIS — Z7901 Long term (current) use of anticoagulants: Secondary | ICD-10-CM | POA: Insufficient documentation

## 2023-07-22 DIAGNOSIS — D45 Polycythemia vera: Secondary | ICD-10-CM | POA: Insufficient documentation

## 2023-07-22 DIAGNOSIS — E611 Iron deficiency: Secondary | ICD-10-CM | POA: Insufficient documentation

## 2023-07-22 DIAGNOSIS — Z8673 Personal history of transient ischemic attack (TIA), and cerebral infarction without residual deficits: Secondary | ICD-10-CM | POA: Diagnosis not present

## 2023-07-22 DIAGNOSIS — D751 Secondary polycythemia: Secondary | ICD-10-CM

## 2023-07-22 LAB — CBC WITH DIFFERENTIAL (CANCER CENTER ONLY)
Abs Immature Granulocytes: 0.5 10*3/uL — ABNORMAL HIGH (ref 0.00–0.07)
Basophils Absolute: 0.2 10*3/uL — ABNORMAL HIGH (ref 0.0–0.1)
Basophils Relative: 1 %
Eosinophils Absolute: 0.7 10*3/uL — ABNORMAL HIGH (ref 0.0–0.5)
Eosinophils Relative: 5 %
HCT: 35.5 % — ABNORMAL LOW (ref 39.0–52.0)
Hemoglobin: 11.6 g/dL — ABNORMAL LOW (ref 13.0–17.0)
Immature Granulocytes: 4 %
Lymphocytes Relative: 14 %
Lymphs Abs: 1.8 10*3/uL (ref 0.7–4.0)
MCH: 28.2 pg (ref 26.0–34.0)
MCHC: 32.7 g/dL (ref 30.0–36.0)
MCV: 86.4 fL (ref 80.0–100.0)
Monocytes Absolute: 1.2 10*3/uL — ABNORMAL HIGH (ref 0.1–1.0)
Monocytes Relative: 9 %
Neutro Abs: 8.7 10*3/uL — ABNORMAL HIGH (ref 1.7–7.7)
Neutrophils Relative %: 67 %
Platelet Count: 510 10*3/uL — ABNORMAL HIGH (ref 150–400)
RBC: 4.11 MIL/uL — ABNORMAL LOW (ref 4.22–5.81)
RDW: 20 % — ABNORMAL HIGH (ref 11.5–15.5)
WBC Count: 13 10*3/uL — ABNORMAL HIGH (ref 4.0–10.5)
nRBC: 4.1 % — ABNORMAL HIGH (ref 0.0–0.2)

## 2023-07-22 LAB — SAVE SMEAR(SSMR), FOR PROVIDER SLIDE REVIEW

## 2023-07-22 LAB — CMP (CANCER CENTER ONLY)
ALT: 38 U/L (ref 0–44)
AST: 41 U/L (ref 15–41)
Albumin: 4.7 g/dL (ref 3.5–5.0)
Alkaline Phosphatase: 73 U/L (ref 38–126)
Anion gap: 11 (ref 5–15)
BUN: 21 mg/dL (ref 8–23)
CO2: 29 mmol/L (ref 22–32)
Calcium: 9.6 mg/dL (ref 8.9–10.3)
Chloride: 99 mmol/L (ref 98–111)
Creatinine: 1.21 mg/dL (ref 0.61–1.24)
GFR, Estimated: >60 mL/min (ref >60)
Glucose, Bld: 90 mg/dL (ref 70–99)
Potassium: 4.2 mmol/L (ref 3.5–5.1)
Sodium: 139 mmol/L (ref 135–145)
Total Bilirubin: 1.4 mg/dL — ABNORMAL HIGH (ref 0.0–1.2)
Total Protein: 6.8 g/dL (ref 6.5–8.1)

## 2023-07-22 LAB — LACTATE DEHYDROGENASE: LDH: 577 U/L — ABNORMAL HIGH (ref 98–192)

## 2023-07-22 NOTE — Progress Notes (Signed)
 Hematology and Oncology Follow Up Visit  [name] [name] [id number] 08/11/1955 68 y.o. 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  The big news clearly is fact that he had surgery for his right shoulder 5 days ago.  He had a reverse shoulder replacement.  He is in a sling right now.  He actually did well with surgery.  There were no problems with bleeding.  He has had no problems with infection or healing right now.  He was [place].  I think he will restart this up in the next couple days.  He has had no cardiac difficulties.  He has had no palpitations.  He has had no change in bowel or bladder habits.  There has been no issues with kidney stones.  He has had no abdominal pain.  Overall, I would have to say that his performance status is probably ECOG 1.    Medications:  Allergies as of 07/22/2023       Reactions   Trazodone Hcl Other (See Comments)   Other reaction(s): too sleepy in AM   Mirtazapine Other (See Comments)   Other reaction(s): too sleepy in the AM Other Reaction(s): Other (See Comments)    Other reaction(s): too sleepy in the AM   Niacin Itching, Other (See Comments)   Flushed feeling Other Reaction(s): Other (See Comments)    Flushed feeling        Medication List        Accurate as of July 22, 2023  3:46 PM. If you have any questions, ask your nurse or doctor.          STOP taking these medications    hydrOXYzine 25 MG capsule Commonly known as: VISTARIL Stopped by: Josph Macho       TAKE these medications    acetaminophen 500 MG tablet Commonly known as: TYLENOL Take 1,000 mg by mouth every 6 (six) hours as needed for mild pain.   amitriptyline 25 MG tablet Commonly known as: ELAVIL Take 2 tablets (50 mg total) by mouth at bedtime.   aspirin EC 81 MG tablet Take 1 tablet (81 mg total) by mouth daily. What changed: when to take this   diltiazem 120 MG 24 hr capsule Commonly known as: CARDIZEM CD Take 1 capsule (120 mg total) by mouth daily.   diltiazem 30 MG tablet Commonly known as: Cardizem Take 1 tablet every 4 hours AS NEEDED for AFIB heart rate >100 as long as top BP >100.   Eliquis 5 MG Tabs tablet Generic drug: apixaban Take 1 tablet (5 mg total) by mouth 2 (two) times daily.   fluocinonide 0.05 % external solution Commonly known as: LIDEX Apply 1 Application topically.   fluorouracil 5 % cream Commonly known as: EFUDEX Apply 1 application  topically 2 (two) times daily as needed (dry skin).   gabapentin 300 MG capsule Commonly known as: NEURONTIN Take 2 capsules (600 mg total) by mouth 2 (two) times daily. What changed:  how much to take  when to take this additional instructions   Gemtesa 75 MG Tabs Generic drug: Vibegron Take 1 tablet by mouth daily.   Jakafi 10 MG tablet Generic drug: ruxolitinib phosphate TAKE 1 TABLET BY MOUTH 2 TIMES DAILY.   losartan 100 MG tablet Commonly known as: COZAAR Take 1 tablet (100 mg total) by mouth daily. What changed: when to take this   Melatonin 10 MG Tabs Take 10 mg by mouth at bedtime.   methocarbamol 500 MG tablet Commonly known as: ROBAXIN Take 1 tablet (500 mg total) by mouth every 6 (six) hours as needed.   multivitamin with minerals Tabs tablet Take 1 tablet by mouth in the morning.   oxyCODONE 5 MG immediate release tablet Commonly known as: Roxicodone Take 1 tablet (5 mg total) by mouth every 4 (four) hours as needed.   propranolol ER 160 MG SR  capsule Commonly known as: INDERAL LA TAKE 1 CAPSULE BY MOUTH EVERY DAY What changed:  how much to take when to take this   rosuvastatin 20 MG tablet Commonly known as: CRESTOR Take 1 tablet (20 mg total) by mouth daily.   tadalafil 5 MG tablet Commonly known as: CIALIS Take 5 mg by mouth every other day. In the morning.   valACYclovir 1000 MG tablet Commonly known as: VALTREX Take 1,000 mg by mouth daily.   zolpidem 12.5 MG CR tablet Commonly known as: AMBIEN CR Take 6.25 mg by mouth at bedtime as needed for sleep.        Allergies:  Allergies  Allergen Reactions   Trazodone Hcl Other (See Comments)    Other reaction(s): too sleepy in AM   Mirtazapine Other (See Comments)    Other reaction(s): too sleepy in the AM  Other Reaction(s): Other (See Comments)    Other reaction(s): too sleepy in the AM   Niacin Itching and Other (See Comments)    Flushed feeling  Other Reaction(s): Other (See Comments)    Flushed feeling     Past Medical History, Surgical history, Social history, and Family History were reviewed and updated.  Review of Systems: Review of Systems  Constitutional: Negative.   HENT: Negative.    Eyes: Negative.   Respiratory: Negative.    Cardiovascular: Negative.   Gastrointestinal: Negative.   Genitourinary: Negative.   Musculoskeletal: Negative.   Skin:  Positive for rash.  Neurological: Negative.   Endo/Heme/Allergies: Negative.   Psychiatric/Behavioral: Negative.       Physical Exam:  height is 5\' 11"  (1.803 m) and weight is 221 lb 12.8 oz (100.6 kg). His oral temperature is 98.2 F (36.8 C). His blood pressure is 125/66 and his pulse is 73. His respiration is 20 and oxygen saturation is 98%.   Wt Readings from Last 3 Encounters:  07/22/23 221 lb 12.8 oz (100.6 kg)  07/18/23 217 lb (98.4 kg)  07/11/23 217 lb (98.4 kg)    Physical Exam Vitals reviewed.  HENT:     Head: Normocephalic and atraumatic.  Eyes:     Pupils: Pupils  are equal, round, and reactive to light.  Cardiovascular:     Rate and Rhythm: Normal rate and regular rhythm.     Heart sounds: Normal heart sounds.  Pulmonary:     Effort: Pulmonary effort is normal.     Breath sounds: Normal breath sounds.  Abdominal:     General: Bowel sounds are normal.     Palpations: Abdomen is soft.     Comments: I cannot palpate his spleen.  Musculoskeletal:  General: No tenderness or deformity. Normal range of motion.     Cervical back: Normal range of motion.     Comments: He is in a shoulder harness for the right shoulder.  This is immobilized right now.  Lymphadenopathy:     Cervical: No cervical adenopathy.  Skin:    General: Skin is warm and dry.     Findings: No erythema or rash.  Neurological:     Mental Status: He is alert and oriented to person, place, and time.  Psychiatric:        Behavior: Behavior normal.        Thought Content: Thought content normal.        Judgment: Judgment normal.      Lab Results  Component Value Date   WBC 13.0 (H) 07/22/2023   HGB 11.6 (L) 07/22/2023   HCT 35.5 (L) 07/22/2023   MCV 86.4 07/22/2023   PLT 510 (H) 07/22/2023   Lab Results  Component Value Date   FERRITIN 58 04/22/2023   IRON 141 04/22/2023   TIBC 392 04/22/2023   UIBC 251 04/22/2023   IRONPCTSAT 36 04/22/2023   Lab Results  Component Value Date   RETICCTPCT 2.9 04/22/2023   RBC 4.11 (L) 07/22/2023   RETICCTABS 87.6 02/21/2011   Lab Results  Component Value Date   KPAFRELGTCHN 10.7 09/27/2021   LAMBDASER 8.5 09/27/2021   KAPLAMBRATIO 1.26 09/27/2021   No results found for: "IGGSERUM", "IGA", "IGMSERUM" No results found for: "TOTALPROTELP", "ALBUMINELP", "A1GS", "A2GS", "BETS", "BETA2SER", "GAMS", "MSPIKE", "SPEI"   Chemistry      Component Value Date/Time   NA 139 07/22/2023 1441   NA 144 04/25/2017 1506   NA 139 04/12/2016 1113   K 4.2 07/22/2023 1441   K 4.2 04/25/2017 1506   K 4.1 04/12/2016 1113   CL 99  07/22/2023 1441   CL 105 04/25/2017 1506   CO2 29 07/22/2023 1441   CO2 29 04/25/2017 1506   CO2 22 04/12/2016 1113   BUN 21 07/22/2023 1441   BUN 17 04/25/2017 1506   BUN 16.3 04/12/2016 1113   CREATININE 1.21 07/22/2023 1441   CREATININE 1.2 04/25/2017 1506   CREATININE 0.9 04/12/2016 1113      Component Value Date/Time   CALCIUM 9.6 07/22/2023 1441   CALCIUM 9.5 04/25/2017 1506   CALCIUM 9.1 04/12/2016 1113   ALKPHOS 73 07/22/2023 1441   ALKPHOS 73 04/25/2017 1506   ALKPHOS 66 04/12/2016 1113   AST 41 07/22/2023 1441   AST 31 04/12/2016 1113   ALT 38 07/22/2023 1441   ALT 58 (H) 04/25/2017 1506   ALT 36 04/12/2016 1113   BILITOT 1.4 (H) 07/22/2023 1441   BILITOT 0.73 04/12/2016 1113       Impression and Plan: Mr. Mercier is a very pleasant 68 yo caucasian gentleman with polycythemia vera, JAK2 positive.  Everything looks quite good right now.  His platelet count is lower.  His splenic volume is lower.  He is doing well on the [place].  I am not surprised that his platelet count is on the high side.  He just had surgery 5 days ago so it is reasonable think that the platelets would be high because they are an acute phase reactant.  I would like to see him back in about 3 months now.  Will try to get him through the [place] day All day.  Only see him back, we will also do a ultrasound of his spleen.  Will plan to  get him back in 3 months now.  I will make sure that he is going be okay when he goes to [place] in May for the [employer]. Marland Kitchen Josph Macho, MD 3/17/20253:46 PM

## 2023-07-23 DIAGNOSIS — C44311 Basal cell carcinoma of skin of nose: Secondary | ICD-10-CM | POA: Diagnosis not present

## 2023-07-25 ENCOUNTER — Other Ambulatory Visit: Payer: Self-pay

## 2023-07-25 NOTE — Progress Notes (Signed)
 Specialty Pharmacy Refill Coordination Note  [name] [name] is a 68 y.o. male contacted today regarding refills of specialty medication(s) Ruxolitinib Phosphate (JAKAFI)   Patient requested (Patient-Rptd) Delivery   Delivery date: (Patient-Rptd) 08/08/23   Verified address: (Patient-Rptd) [address] [place], [place] [address]   Medication will be filled on 04.02.25.

## 2023-08-02 DIAGNOSIS — M19011 Primary osteoarthritis, right shoulder: Secondary | ICD-10-CM | POA: Diagnosis not present

## 2023-08-05 DIAGNOSIS — Z4789 Encounter for other orthopedic aftercare: Secondary | ICD-10-CM | POA: Diagnosis not present

## 2023-08-05 DIAGNOSIS — M25511 Pain in right shoulder: Secondary | ICD-10-CM | POA: Diagnosis not present

## 2023-08-05 DIAGNOSIS — R531 Weakness: Secondary | ICD-10-CM | POA: Diagnosis not present

## 2023-08-07 ENCOUNTER — Other Ambulatory Visit: Payer: Self-pay

## 2023-08-09 DIAGNOSIS — Z4789 Encounter for other orthopedic aftercare: Secondary | ICD-10-CM | POA: Diagnosis not present

## 2023-08-09 DIAGNOSIS — R531 Weakness: Secondary | ICD-10-CM | POA: Diagnosis not present

## 2023-08-09 DIAGNOSIS — M25511 Pain in right shoulder: Secondary | ICD-10-CM | POA: Diagnosis not present

## 2023-08-12 DIAGNOSIS — R531 Weakness: Secondary | ICD-10-CM | POA: Diagnosis not present

## 2023-08-12 DIAGNOSIS — M25511 Pain in right shoulder: Secondary | ICD-10-CM | POA: Diagnosis not present

## 2023-08-12 DIAGNOSIS — Z4789 Encounter for other orthopedic aftercare: Secondary | ICD-10-CM | POA: Diagnosis not present

## 2023-08-14 DIAGNOSIS — R531 Weakness: Secondary | ICD-10-CM | POA: Diagnosis not present

## 2023-08-14 DIAGNOSIS — Z4789 Encounter for other orthopedic aftercare: Secondary | ICD-10-CM | POA: Diagnosis not present

## 2023-08-14 DIAGNOSIS — M25511 Pain in right shoulder: Secondary | ICD-10-CM | POA: Diagnosis not present

## 2023-08-16 ENCOUNTER — Encounter: Payer: Self-pay | Admitting: Family

## 2023-08-26 ENCOUNTER — Telehealth: Payer: Self-pay

## 2023-08-26 DIAGNOSIS — Z4789 Encounter for other orthopedic aftercare: Secondary | ICD-10-CM | POA: Diagnosis not present

## 2023-08-26 DIAGNOSIS — C44311 Basal cell carcinoma of skin of nose: Secondary | ICD-10-CM | POA: Diagnosis not present

## 2023-08-26 DIAGNOSIS — R531 Weakness: Secondary | ICD-10-CM | POA: Diagnosis not present

## 2023-08-26 DIAGNOSIS — M25511 Pain in right shoulder: Secondary | ICD-10-CM | POA: Diagnosis not present

## 2023-08-26 NOTE — Telephone Encounter (Signed)
   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 Celedonio Coil, MD Surgeon's Group or Practice Name: [facility] Phone number: [phone] Fax number: [phone]   Type of Clearance Requested:   - Medical  - Pharmacy:  Hold Apixaban  (Eliquis )     Type of Anesthesia:  Not Indicated   Additional requests/questions:    [name]   08/26/2023, 5:11 PM

## 2023-08-27 ENCOUNTER — Other Ambulatory Visit: Payer: Self-pay | Admitting: *Deleted

## 2023-08-27 DIAGNOSIS — G629 Polyneuropathy, unspecified: Secondary | ICD-10-CM

## 2023-08-27 NOTE — Telephone Encounter (Signed)
 Spoke to patient taking Gabapentin  dosage  differently Take  2 capsules (600mg  )  by mouth in morning , 2 capsules (600 mg) in afternoon and 1 capsule (300mg )  at bedtime  Jessica,NP aware sending her new rx for approval

## 2023-08-28 DIAGNOSIS — R531 Weakness: Secondary | ICD-10-CM | POA: Diagnosis not present

## 2023-08-28 DIAGNOSIS — M25511 Pain in right shoulder: Secondary | ICD-10-CM | POA: Diagnosis not present

## 2023-08-28 DIAGNOSIS — Z4789 Encounter for other orthopedic aftercare: Secondary | ICD-10-CM | POA: Diagnosis not present

## 2023-08-28 MED ORDER — GABAPENTIN 300 MG PO CAPS: ORAL_CAPSULE | ORAL | 3 refills | Status: AC

## 2023-08-28 NOTE — Telephone Encounter (Signed)
   Name: [name] [name]  DOB: [birth year]  MRN: [id number]  Primary Cardiologist: Alexandria Angel, MD   Preoperative team, please contact this patient and set up a phone call appointment for further preoperative risk assessment. Please obtain consent and complete medication review. Thank you for your help.  I confirm that guidance regarding antiplatelet and oral anticoagulation therapy has been completed and, if necessary, noted below.  Per office protocol, patient can hold Eliquis  for 2 days prior to procedure.   Patient will not need bridging with Lovenox (enoxaparin) around procedure. Please resume Eliquis  as soon as possible postprocedure, at the discretion of the surgeon.   I also confirmed the patient resides in the state of [place] . As per [facility] Medical Board telemedicine laws, the patient must reside in the state in which the provider is licensed.   Jude Norton, NP 08/28/2023, 4:41 PM [facility]

## 2023-08-28 NOTE — Telephone Encounter (Signed)
 Patient with diagnosis of atrial fibrillation on Eliquis  for anticoagulation.    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 The patient's score is based upon: CHF History: 0 HTN History: 1 Diabetes History: 0 Stroke History: 2 Vascular Disease History: 1 Age Score: 1 Gender Score: 0   Per chart stroke was 09/2018, no residual deficits  CrCl 87 Platelet count 510  Patient has not  had an Afib/aflutter ablation within the last 3 months or DCCV within the last 30 days  Per office protocol, patient can hold Eliquis  for 2 days prior to procedure.   Patient will not need bridging with Lovenox (enoxaparin) around procedure.  **This guidance is not considered finalized until pre-operative APP has relayed final recommendations.**

## 2023-08-29 ENCOUNTER — Telehealth: Payer: Self-pay | Admitting: *Deleted

## 2023-08-29 NOTE — Telephone Encounter (Signed)
  Patient Consent for Virtual Visit         [name] [name] has provided verbal consent on 08/29/2023 for a virtual visit (video or telephone).   CONSENT FOR VIRTUAL VISIT FOR:  [name] [name]  By participating in this virtual visit I agree to the following:  I hereby voluntarily request, consent and authorize [facility] and its employed or contracted physicians, physician assistants, nurse practitioners or other licensed health care professionals (the Practitioner), to provide me with telemedicine health care services (the "Services") as deemed necessary by the treating Practitioner. I acknowledge and consent to receive the Services by the Practitioner via telemedicine. I understand that the telemedicine visit will involve communicating with the Practitioner through live audiovisual communication technology and the disclosure of certain medical information by electronic transmission. I acknowledge that I have been given the opportunity to request an in-person assessment or other available alternative prior to the telemedicine visit and am voluntarily participating in the telemedicine visit.  I understand that I have the right to withhold or withdraw my consent to the use of telemedicine in the course of my care at any time, without affecting my right to future care or treatment, and that the Practitioner or I may terminate the telemedicine visit at any time. I understand that I have the right to inspect all information obtained and/or recorded in the course of the telemedicine visit and may receive copies of available information for a reasonable fee.  I understand that some of the potential risks of receiving the Services via telemedicine include:  Delay or interruption in medical evaluation due to technological equipment failure or disruption; Information transmitted may not be sufficient (e.g. poor resolution of images) to allow for appropriate medical decision making by the Practitioner;  and/or  In rare instances, security protocols could fail, causing a breach of personal health information.  Furthermore, I acknowledge that it is my responsibility to provide information about my medical history, conditions and care that is complete and accurate to the best of my ability. I acknowledge that Practitioner's advice, recommendations, and/or decision may be based on factors not within their control, such as incomplete or inaccurate data provided by me or distortions of diagnostic images or specimens that may result from electronic transmissions. I understand that the practice of medicine is not an exact science and that Practitioner makes no warranties or guarantees regarding treatment outcomes. I acknowledge that a copy of this consent can be made available to me via my patient portal [facility] MyChart), or I can request a printed copy by calling the office of [facility].    I understand that my insurance will be billed for this visit.   I have read or had this consent read to me. I understand the contents of this consent, which adequately explains the benefits and risks of the Services being provided via telemedicine.  I have been provided ample opportunity to ask questions regarding this consent and the Services and have had my questions answered to my satisfaction. I give my informed consent for the services to be provided through the use of telemedicine in my medical care

## 2023-08-29 NOTE — Telephone Encounter (Signed)
 PATIENT SCHEDULED 09-12-23

## 2023-09-03 ENCOUNTER — Other Ambulatory Visit: Payer: Self-pay

## 2023-09-03 DIAGNOSIS — M25511 Pain in right shoulder: Secondary | ICD-10-CM | POA: Diagnosis not present

## 2023-09-03 DIAGNOSIS — Z4789 Encounter for other orthopedic aftercare: Secondary | ICD-10-CM | POA: Diagnosis not present

## 2023-09-03 DIAGNOSIS — R531 Weakness: Secondary | ICD-10-CM | POA: Diagnosis not present

## 2023-09-05 DIAGNOSIS — Z4789 Encounter for other orthopedic aftercare: Secondary | ICD-10-CM | POA: Diagnosis not present

## 2023-09-05 DIAGNOSIS — R531 Weakness: Secondary | ICD-10-CM | POA: Diagnosis not present

## 2023-09-05 DIAGNOSIS — M25511 Pain in right shoulder: Secondary | ICD-10-CM | POA: Diagnosis not present

## 2023-09-06 ENCOUNTER — Other Ambulatory Visit: Payer: Self-pay

## 2023-09-06 ENCOUNTER — Other Ambulatory Visit (HOSPITAL_COMMUNITY): Payer: Self-pay

## 2023-09-06 NOTE — Progress Notes (Signed)
 Specialty Pharmacy Refill Coordination Note  [name] [name] is a 68 y.o. male contacted today regarding refills of specialty medication(s) Ruxolitinib  Phosphate (JAKAFI )   Patient requested Delivery   Delivery date: 09/10/23   Verified address: [address]   [place] [address]   Medication will be filled on 09/09/23.

## 2023-09-09 ENCOUNTER — Other Ambulatory Visit: Payer: Self-pay

## 2023-09-09 DIAGNOSIS — Z4789 Encounter for other orthopedic aftercare: Secondary | ICD-10-CM | POA: Diagnosis not present

## 2023-09-09 DIAGNOSIS — R531 Weakness: Secondary | ICD-10-CM | POA: Diagnosis not present

## 2023-09-09 DIAGNOSIS — M25511 Pain in right shoulder: Secondary | ICD-10-CM | POA: Diagnosis not present

## 2023-09-12 ENCOUNTER — Ambulatory Visit: Attending: Internal Medicine | Admitting: Student

## 2023-09-12 DIAGNOSIS — Z01810 Encounter for preprocedural cardiovascular examination: Secondary | ICD-10-CM

## 2023-09-12 NOTE — Progress Notes (Signed)
 Virtual Visit via Telephone Note   Because of [name]'s co-morbid illnesses, he is at least at moderate risk for complications without adequate follow up.  This format is felt to be most appropriate for this patient at this time.  The patient did not have access to video technology/had technical difficulties with video requiring transitioning to audio format only (telephone).  All issues noted in this document were discussed and addressed.  No physical exam could be performed with this format.  Please refer to the patient's chart for his consent to telehealth for [facility]  Evaluation Performed:  Preoperative cardiovascular risk assessment _____________   Date:  09/12/2023   Patient ID:  [name] [name], DOB 03/23/1956, MRN [id number] Patient Location:  Home Provider location:   Office  Primary Care Provider:  Bertha Broad, MD Primary Cardiologist:  Alexandria Angel, MD  Chief Complaint / Patient Profile   68 y.o. y/o male with a h/o PAF on anticoagulation, hypertension, hyperlipidemia, CVA, anemia who is pending forehead flap for Mohs reconstruction (BCC to nasal tip x 2) by Dr. Amy Kansky on 12/03/2023 and presents today for telephonic preoperative cardiovascular risk assessment.  History of Present Illness    [name] [name] is a 68 y.o. male who presents via audio/video conferencing for a telehealth visit today.  Pt was last seen in cardiology clinic on 11/30/2022 by Dr. Lawana Pray.  At that time [name] [name] was stable from a cardiac standpoint.  The patient is now pending procedure as outlined above. Since his last visit, he is doing well. He has chronic mild shortness of breath that has been a long standing issue for him. It does not prevent him from doing his normal activities. He denies chest pain, pressure or tightness. No lower extremity edema, orthopnea or PND. He has occasional palpitations lasting 10-15 minutes and resolving with rest and controlled breathing. He is  active walking for exercise and performing yard work and household chores.   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   Diverticulosis of colon (without mention of hemorrhage)    Dysrhythmia    A fib   Heart murmur    slight  no issues   Hemorrhoids    History of kidney stones    Hypertension    IBS (irritable bowel syndrome)    Iron deficiency anemia due to chronic blood loss 09/26/2017   Irregular heartbeat    Neuromuscular disorder (HCC)    neuropathy legs and feet   Other and unspecified hyperlipidemia    Perirectal fistula    Polycythemia, secondary    Sleep apnea    mild cpap uses dental appliance   Stroke (HCC)    mild speech glich at times no other issues   Stye    blocker duct right eye lid   Past Surgical History:  Procedure Laterality Date   ATRIAL FIBRILLATION ABLATION N/A 04/13/2022   Procedure: ATRIAL FIBRILLATION ABLATION;  Surgeon: Lei Pump, MD;  Location: [facility];  Service: Cardiovascular;  Laterality: N/A;   CARPAL TUNNEL RELEASE Left 09/2022   RETINAL DETACHMENT SURGERY  2005   REVERSE SHOULDER ARTHROPLASTY Right 07/18/2023   Procedure: ARTHROPLASTY, SHOULDER, TOTAL, REVERSE;  Surgeon: Sammye Cristal, MD;  Location: [facility];  Service: Orthopedics;  Laterality: Right;   ROTATOR CUFF REPAIR  1995   rt.   VASECTOMY  1991-or 1992    Allergies  Allergies  Allergen Reactions   Trazodone Hcl Other (See Comments)    Other reaction(s): too sleepy in AM   Mirtazapine Other (See Comments)    Other reaction(s): too sleepy in the AM  Other Reaction(s): Other (See Comments)    Other reaction(s): too sleepy in the AM   Niacin Itching and Other (See Comments)    Flushed feeling  Other Reaction(s): Other (See Comments)    Flushed feeling    Home Medications    Prior to Admission medications   Medication Sig Start Date  End Date Taking? Authorizing Provider  acetaminophen  (TYLENOL ) 500 MG tablet Take 1,000 mg by mouth every 6 (six) hours as needed for mild pain.     [provider]  amitriptyline  (ELAVIL ) 25 MG tablet Take 2 tablets (50 mg total) by mouth at bedtime. 07/02/23   Johny Nap, NP  apixaban  (ELIQUIS ) 5 MG TABS tablet Take 1 tablet (5 mg total) by mouth 2 (two) times daily. 07/11/23 07/11/24  Camnitz, Babetta Lesch, MD  aspirin  EC 81 MG EC tablet Take 1 tablet (81 mg total) by mouth daily. Patient taking differently: Take 81 mg by mouth at bedtime. 09/16/18   Kraig Peru, MD  diltiazem  (CARDIZEM  CD) 120 MG 24 hr capsule Take 1 capsule (120 mg total) by mouth daily. 01/16/23   Lenise Quince, MD  diltiazem  (CARDIZEM ) 30 MG tablet Take 1 tablet every 4 hours AS NEEDED for AFIB heart rate >100 as long as top BP >100. 06/27/22   Fenton, Clint R, PA  fluocinonide (LIDEX) 0.05 % external solution Apply 1 Application topically. 07/19/23   [provider]  fluorouracil (EFUDEX) 5 % cream Apply 1 application  topically 2 (two) times daily as needed (dry skin). Patient not taking: Reported on 07/22/2023 10/28/20   [provider]  gabapentin  (NEURONTIN ) 300 MG capsule Take  2 capsules (600mg  )  by mouth in morning , 2 capsules (600 mg) in afternoon and 1 capsule (300mg )  at bedtime 08/28/23   Johny Nap, NP  GEMTESA 75 MG TABS Take 1 tablet by mouth daily. 10/04/22   [provider]  losartan  (COZAAR ) 100 MG tablet Take 1 tablet (100 mg total) by mouth daily. Patient taking differently: Take 100 mg by mouth at bedtime. 01/16/23   Lenise Quince, MD  Melatonin 10 MG TABS Take 10 mg by mouth at bedtime.    [provider]  Multiple Vitamin (MULTIVITAMIN WITH MINERALS) TABS tablet Take 1 tablet by mouth in the morning.    [provider]  propranolol  ER (INDERAL  LA) 160 MG SR capsule TAKE 1 CAPSULE BY MOUTH EVERY DAY Patient taking differently: Take 160 mg by  mouth at bedtime. 09/28/22   Lenise Quince, MD  rosuvastatin  (CRESTOR ) 20 MG tablet Take 1 tablet (20 mg total) by mouth daily. 01/16/23   Lenise Quince, MD  ruxolitinib  phosphate (JAKAFI ) 10 MG tablet TAKE 1 TABLET BY MOUTH 2 TIMES DAILY. 04/23/23 04/22/24  Ivor Mars, MD  tadalafil (CIALIS) 5 MG tablet Take 5 mg by mouth every other day. In the morning. 05/16/21   [provider]  zolpidem  (AMBIEN  CR) 12.5 MG CR tablet Take 6.25 mg by mouth at bedtime as needed for sleep.    [provider]    Physical Exam    Vital Signs:  [name] [name] does not have vital signs available for review today.  Given telephonic nature of communication,  physical exam is limited. AAOx3. NAD. Normal affect.  Speech and respirations are unlabored.   Assessment & Plan    Primary Cardiologist: Alexandria Angel, MD  Preoperative cardiovascular risk assessment.  Forehead flap for Mohs reconstruction (BCC to nasal tip x 2) by Dr. Amy Kansky on 12/03/2023.  Chart reviewed as part of pre-operative protocol coverage. According to the RCRI, patient has a 0.9% risk of MACE. Patient reports activity equivalent to >4.0 METS (walking for exercise, yard work and household chores).   Given past medical history and time since last visit, based on ACC/AHA guidelines, [name] [name] would be at acceptable risk for the planned procedure without further cardiovascular testing.   Patient was advised that if he develops new symptoms prior to surgery to contact our office to arrange a follow-up appointment.  he verbalized understanding.  Per Pharm D, patient may hold Eliquis  for 2 days prior to procedure.  Patient will not need bridging with Lovenox (enoxaparin) around procedure.  I will route this recommendation to the requesting party via Epic fax function.  Please call with questions.  Time:   Today, I have spent 6 minutes with the patient with telehealth technology discussing medical history, symptoms,  and management plan.     [name] Ar, NP  09/12/2023, 8:46 AM

## 2023-09-13 DIAGNOSIS — Z4789 Encounter for other orthopedic aftercare: Secondary | ICD-10-CM | POA: Diagnosis not present

## 2023-09-13 DIAGNOSIS — R531 Weakness: Secondary | ICD-10-CM | POA: Diagnosis not present

## 2023-09-13 DIAGNOSIS — M25511 Pain in right shoulder: Secondary | ICD-10-CM | POA: Diagnosis not present

## 2023-09-17 DIAGNOSIS — R531 Weakness: Secondary | ICD-10-CM | POA: Diagnosis not present

## 2023-09-17 DIAGNOSIS — M25511 Pain in right shoulder: Secondary | ICD-10-CM | POA: Diagnosis not present

## 2023-09-17 DIAGNOSIS — Z4789 Encounter for other orthopedic aftercare: Secondary | ICD-10-CM | POA: Diagnosis not present

## 2023-09-20 DIAGNOSIS — M25511 Pain in right shoulder: Secondary | ICD-10-CM | POA: Diagnosis not present

## 2023-09-20 DIAGNOSIS — Z4789 Encounter for other orthopedic aftercare: Secondary | ICD-10-CM | POA: Diagnosis not present

## 2023-09-20 DIAGNOSIS — R531 Weakness: Secondary | ICD-10-CM | POA: Diagnosis not present

## 2023-09-23 DIAGNOSIS — R531 Weakness: Secondary | ICD-10-CM | POA: Diagnosis not present

## 2023-09-23 DIAGNOSIS — Z4789 Encounter for other orthopedic aftercare: Secondary | ICD-10-CM | POA: Diagnosis not present

## 2023-09-23 DIAGNOSIS — M25511 Pain in right shoulder: Secondary | ICD-10-CM | POA: Diagnosis not present

## 2023-09-25 DIAGNOSIS — M25511 Pain in right shoulder: Secondary | ICD-10-CM | POA: Diagnosis not present

## 2023-09-25 DIAGNOSIS — R531 Weakness: Secondary | ICD-10-CM | POA: Diagnosis not present

## 2023-09-25 DIAGNOSIS — Z4789 Encounter for other orthopedic aftercare: Secondary | ICD-10-CM | POA: Diagnosis not present

## 2023-09-28 ENCOUNTER — Other Ambulatory Visit: Payer: Self-pay | Admitting: Cardiology

## 2023-09-28 DIAGNOSIS — R002 Palpitations: Secondary | ICD-10-CM

## 2023-10-07 ENCOUNTER — Other Ambulatory Visit: Payer: Self-pay

## 2023-10-07 DIAGNOSIS — Z4789 Encounter for other orthopedic aftercare: Secondary | ICD-10-CM | POA: Diagnosis not present

## 2023-10-07 DIAGNOSIS — M25511 Pain in right shoulder: Secondary | ICD-10-CM | POA: Diagnosis not present

## 2023-10-07 DIAGNOSIS — R531 Weakness: Secondary | ICD-10-CM | POA: Diagnosis not present

## 2023-10-08 ENCOUNTER — Other Ambulatory Visit: Payer: Self-pay | Admitting: Hematology & Oncology

## 2023-10-08 ENCOUNTER — Other Ambulatory Visit: Payer: Self-pay

## 2023-10-08 ENCOUNTER — Other Ambulatory Visit: Payer: Self-pay | Admitting: Pharmacy Technician

## 2023-10-08 ENCOUNTER — Other Ambulatory Visit (HOSPITAL_COMMUNITY): Payer: Self-pay

## 2023-10-08 DIAGNOSIS — D751 Secondary polycythemia: Secondary | ICD-10-CM

## 2023-10-08 MED ORDER — RUXOLITINIB PHOSPHATE 10 MG PO TABS
ORAL_TABLET | Freq: Two times a day (BID) | ORAL | 4 refills | Status: DC
  Filled 2023-10-08: qty 60, 30d supply, fill #0
  Filled 2023-11-01 – 2023-11-04 (×2): qty 60, 30d supply, fill #1
  Filled 2023-12-04: qty 60, 30d supply, fill #2
  Filled 2023-12-27: qty 60, 30d supply, fill #3
  Filled 2024-02-05: qty 60, 30d supply, fill #4

## 2023-10-08 NOTE — Progress Notes (Signed)
 Specialty Pharmacy Refill Coordination Note  [name] [name] is a 68 y.o. male contacted today regarding refills of specialty medication(s) Ruxolitinib  Phosphate (JAKAFI )   Patient requested Delivery   Delivery date: 10/11/23   Verified address: [address]  [place]   Medication will be filled on 10/10/23.  This fill date is pending response to refill request from provider. Patient is aware and if they have not received fill by intended date they must follow up with pharmacy.

## 2023-10-09 ENCOUNTER — Other Ambulatory Visit: Payer: Self-pay

## 2023-10-09 DIAGNOSIS — M25511 Pain in right shoulder: Secondary | ICD-10-CM | POA: Diagnosis not present

## 2023-10-09 DIAGNOSIS — R531 Weakness: Secondary | ICD-10-CM | POA: Diagnosis not present

## 2023-10-09 DIAGNOSIS — Z4789 Encounter for other orthopedic aftercare: Secondary | ICD-10-CM | POA: Diagnosis not present

## 2023-10-10 ENCOUNTER — Inpatient Hospital Stay: Admitting: Hematology & Oncology

## 2023-10-10 ENCOUNTER — Ambulatory Visit (HOSPITAL_BASED_OUTPATIENT_CLINIC_OR_DEPARTMENT_OTHER)
Admission: RE | Admit: 2023-10-10 | Discharge: 2023-10-10 | Disposition: A | Discharge status: Home / Self Care | Source: Ambulatory Visit | Attending: Hematology & Oncology | Admitting: Hematology & Oncology

## 2023-10-10 ENCOUNTER — Inpatient Hospital Stay: Attending: Hematology & Oncology

## 2023-10-10 ENCOUNTER — Encounter: Payer: Self-pay | Admitting: Hematology & Oncology

## 2023-10-10 VITALS — BP 121/71 | HR 56 | Temp 97.7°F | Resp 20 | Ht 71.0 in | Wt 221.4 lb

## 2023-10-10 DIAGNOSIS — Z7901 Long term (current) use of anticoagulants: Secondary | ICD-10-CM | POA: Insufficient documentation

## 2023-10-10 DIAGNOSIS — D45 Polycythemia vera: Secondary | ICD-10-CM | POA: Insufficient documentation

## 2023-10-10 DIAGNOSIS — R161 Splenomegaly, not elsewhere classified: Secondary | ICD-10-CM | POA: Diagnosis not present

## 2023-10-10 DIAGNOSIS — D751 Secondary polycythemia: Secondary | ICD-10-CM

## 2023-10-10 DIAGNOSIS — E611 Iron deficiency: Secondary | ICD-10-CM | POA: Diagnosis not present

## 2023-10-10 LAB — CMP (CANCER CENTER ONLY)
ALT: 20 U/L (ref 0–44)
AST: 26 U/L (ref 15–41)
Albumin: 4.8 g/dL (ref 3.5–5.0)
Alkaline Phosphatase: 57 U/L (ref 38–126)
Anion gap: 6 (ref 5–15)
BUN: 22 mg/dL (ref 8–23)
CO2: 29 mmol/L (ref 22–32)
Calcium: 9.2 mg/dL (ref 8.9–10.3)
Chloride: 105 mmol/L (ref 98–111)
Creatinine: 1.27 mg/dL — ABNORMAL HIGH (ref 0.61–1.24)
GFR, Estimated: >60 mL/min (ref >60)
Glucose, Bld: 97 mg/dL (ref 70–99)
Potassium: 4.3 mmol/L (ref 3.5–5.1)
Sodium: 140 mmol/L (ref 135–145)
Total Bilirubin: 0.9 mg/dL (ref 0.0–1.2)
Total Protein: 6.6 g/dL (ref 6.5–8.1)

## 2023-10-10 LAB — CBC WITH DIFFERENTIAL (CANCER CENTER ONLY)
Abs Immature Granulocytes: 0.17 10*3/uL — ABNORMAL HIGH (ref 0.00–0.07)
Basophils Absolute: 0.2 10*3/uL — ABNORMAL HIGH (ref 0.0–0.1)
Basophils Relative: 2 %
Eosinophils Absolute: 0.4 10*3/uL (ref 0.0–0.5)
Eosinophils Relative: 4 %
HCT: 35.6 % — ABNORMAL LOW (ref 39.0–52.0)
Hemoglobin: 11.8 g/dL — ABNORMAL LOW (ref 13.0–17.0)
Immature Granulocytes: 2 %
Lymphocytes Relative: 13 %
Lymphs Abs: 1.3 10*3/uL (ref 0.7–4.0)
MCH: 27.6 pg (ref 26.0–34.0)
MCHC: 33.1 g/dL (ref 30.0–36.0)
MCV: 83.4 fL (ref 80.0–100.0)
Monocytes Absolute: 1 10*3/uL (ref 0.1–1.0)
Monocytes Relative: 10 %
Neutro Abs: 6.8 10*3/uL (ref 1.7–7.7)
Neutrophils Relative %: 69 %
Platelet Count: 364 10*3/uL (ref 150–400)
RBC: 4.27 MIL/uL (ref 4.22–5.81)
RDW: 19.1 % — ABNORMAL HIGH (ref 11.5–15.5)
WBC Count: 9.8 10*3/uL (ref 4.0–10.5)
nRBC: 0.9 % — ABNORMAL HIGH (ref 0.0–0.2)

## 2023-10-10 LAB — LACTATE DEHYDROGENASE: LDH: 489 U/L — ABNORMAL HIGH (ref 98–192)

## 2023-10-10 NOTE — Progress Notes (Signed)
 Hematology and Oncology Follow Up Visit  [name] [name] [id number] [birth year] 68 y.o. 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  He now has been retired for about 3 years.  He is enjoying his retirement.  He really has recovered nicely from his right shoulder surgery.  He really has a lot of range of motion in that shoulder.  He and his wife I think just got back from another vacation.  He was in [place].  He saw the [place].  He then went down to [place].  He enjoyed reveal quite a bit.  They do like to travel and see their children.  He had a splenic ultrasound today.  Surprising, the spleen has shrunk quite a bit.  The size now is 300 cm.  The prior size of the spleen was 662 cm.  He has had no problems with his blood thinner.  He has had no issues with respect to the patent foramen ovale.  This was closed.  He has had no issues with nausea or vomiting.  He has had no change in bowel or bladder habits.  He has had no leg swelling.  He has had no problems with kidney stones.  Of note, he said that he did have a "cold" when he was on his trip.  Overall, I would have to say that his performance status is ECOG 1.   Medications:  Allergies as of 10/10/2023       Reactions   Trazodone Hcl Other (See Comments)   Other reaction(s): too sleepy in AM   Mirtazapine Other (See Comments)   Other reaction(s): too sleepy in the AM Other Reaction(s): Other (See Comments)    Other reaction(s): too sleepy  in the AM   Niacin Itching, Other (See Comments)   Flushed feeling Other Reaction(s): Other (See Comments)    Flushed feeling        Medication List        Accurate as of October 10, 2023  9:41 AM. If you have any questions, ask your nurse or doctor.          acetaminophen  500 MG tablet Commonly known as: TYLENOL  Take 1,000 mg by mouth every 6 (six) hours as needed for mild pain.   amitriptyline  25 MG tablet Commonly known as: ELAVIL  Take 2 tablets (50 mg total) by mouth at bedtime.   aspirin  EC 81 MG tablet Take 1 tablet (81 mg total) by mouth daily. What changed: when to take this   diltiazem  120 MG 24 hr capsule Commonly known as: CARDIZEM  CD Take 1 capsule (120 mg total) by mouth daily.   diltiazem  30 MG tablet Commonly known as: Cardizem  Take 1 tablet every 4 hours AS NEEDED for AFIB heart rate >100 as long as top BP >100.   Eliquis  5 MG Tabs tablet Generic drug: apixaban  Take 1 tablet (5 mg total) by mouth 2 (two) times daily.   fluocinonide 0.05 % external solution Commonly known as: LIDEX Apply 1 Application topically.  fluorouracil 5 % cream Commonly known as: EFUDEX Apply 1 application  topically 2 (two) times daily as needed (dry skin).   gabapentin  300 MG capsule Commonly known as: NEURONTIN  Take  2 capsules (600mg  )  by mouth in morning , 2 capsules (600 mg) in afternoon and 1 capsule (300mg )  at bedtime   Gemtesa 75 MG Tabs Generic drug: Vibegron Take 1 tablet by mouth daily.   losartan  100 MG tablet Commonly known as: COZAAR  Take 1 tablet (100 mg total) by mouth daily. What changed: when to take this   Melatonin 10 MG Tabs Take 10 mg by mouth at bedtime.   multivitamin with minerals Tabs tablet Take 1 tablet by mouth in the morning.   propranolol  ER 160 MG SR capsule Commonly known as: INDERAL  LA TAKE 1 CAPSULE BY MOUTH DAILY   rosuvastatin  20 MG tablet Commonly known as: CRESTOR  Take 1 tablet (20 mg total) by mouth daily.    ruxolitinib  phosphate 10 MG tablet Commonly known as: JAKAFI  TAKE 1 TABLET BY MOUTH 2 TIMES DAILY.   tadalafil 5 MG tablet Commonly known as: CIALIS Take 5 mg by mouth every other day. In the morning.   zolpidem  12.5 MG CR tablet Commonly known as: AMBIEN  CR Take 6.25 mg by mouth at bedtime as needed for sleep.        Allergies:  Allergies  Allergen Reactions   Trazodone Hcl Other (See Comments)    Other reaction(s): too sleepy in AM   Mirtazapine Other (See Comments)    Other reaction(s): too sleepy in the AM  Other Reaction(s): Other (See Comments)    Other reaction(s): too sleepy in the AM   Niacin Itching and Other (See Comments)    Flushed feeling  Other Reaction(s): Other (See Comments)    Flushed feeling     Past Medical History, Surgical history, Social history, and Family History were reviewed and updated.  Review of Systems: Review of Systems  Constitutional: Negative.   HENT: Negative.    Eyes: Negative.   Respiratory: Negative.    Cardiovascular: Negative.   Gastrointestinal: Negative.   Genitourinary: Negative.   Musculoskeletal: Negative.   Skin:  Positive for rash.  Neurological: Negative.   Endo/Heme/Allergies: Negative.   Psychiatric/Behavioral: Negative.       Physical Exam:  height is 5\' 11"  (1.803 m) and weight is 221 lb 6.4 oz (100.4 kg). His oral temperature is 97.7 F (36.5 C). His blood pressure is 121/71 and his pulse is 56 (abnormal). His respiration is 20 and oxygen saturation is 95%.   Wt Readings from Last 3 Encounters:  10/10/23 221 lb 6.4 oz (100.4 kg)  07/22/23 221 lb 12.8 oz (100.6 kg)  07/18/23 217 lb (98.4 kg)    Physical Exam Vitals reviewed.  HENT:     Head: Normocephalic and atraumatic.  Eyes:     Pupils: Pupils are equal, round, and reactive to light.  Cardiovascular:     Rate and Rhythm: Normal rate and regular rhythm.     Heart sounds: Normal heart sounds.  Pulmonary:     Effort: Pulmonary effort is  normal.     Breath sounds: Normal breath sounds.  Abdominal:     General: Bowel sounds are normal.     Palpations: Abdomen is soft.     Comments: I cannot palpate his spleen.  Musculoskeletal:        General: No tenderness or deformity. Normal range of motion.     Cervical back: Normal range  of motion.     Comments: He is in a shoulder harness for the right shoulder.  This is immobilized right now.  Lymphadenopathy:     Cervical: No cervical adenopathy.  Skin:    General: Skin is warm and dry.     Findings: No erythema or rash.  Neurological:     Mental Status: He is alert and oriented to person, place, and time.  Psychiatric:        Behavior: Behavior normal.        Thought Content: Thought content normal.        Judgment: Judgment normal.      Lab Results  Component Value Date   WBC 9.8 10/10/2023   HGB 11.8 (L) 10/10/2023   HCT 35.6 (L) 10/10/2023   MCV 83.4 10/10/2023   PLT 364 10/10/2023   Lab Results  Component Value Date   FERRITIN 58 04/22/2023   IRON 141 04/22/2023   TIBC 392 04/22/2023   UIBC 251 04/22/2023   IRONPCTSAT 36 04/22/2023   Lab Results  Component Value Date   RETICCTPCT 2.9 04/22/2023   RBC 4.27 10/10/2023   RETICCTABS 87.6 02/21/2011   Lab Results  Component Value Date   KPAFRELGTCHN 10.7 09/27/2021   LAMBDASER 8.5 09/27/2021   KAPLAMBRATIO 1.26 09/27/2021   No results found for: "IGGSERUM", "IGA", "IGMSERUM" No results found for: "TOTALPROTELP", "ALBUMINELP", "A1GS", "A2GS", "BETS", "BETA2SER", "GAMS", "MSPIKE", "SPEI"   Chemistry      Component Value Date/Time   NA 140 10/10/2023 0840   NA 144 04/25/2017 1506   NA 139 04/12/2016 1113   K 4.3 10/10/2023 0840   K 4.2 04/25/2017 1506   K 4.1 04/12/2016 1113   CL 105 10/10/2023 0840   CL 105 04/25/2017 1506   CO2 29 10/10/2023 0840   CO2 29 04/25/2017 1506   CO2 22 04/12/2016 1113   BUN 22 10/10/2023 0840   BUN 17 04/25/2017 1506   BUN 16.3 04/12/2016 1113   CREATININE 1.27  (H) 10/10/2023 0840   CREATININE 1.2 04/25/2017 1506   CREATININE 0.9 04/12/2016 1113      Component Value Date/Time   CALCIUM  9.2 10/10/2023 0840   CALCIUM  9.5 04/25/2017 1506   CALCIUM  9.1 04/12/2016 1113   ALKPHOS 57 10/10/2023 0840   ALKPHOS 73 04/25/2017 1506   ALKPHOS 66 04/12/2016 1113   AST 26 10/10/2023 0840   AST 31 04/12/2016 1113   ALT 20 10/10/2023 0840   ALT 58 (H) 04/25/2017 1506   ALT 36 04/12/2016 1113   BILITOT 0.9 10/10/2023 0840   BILITOT 0.73 04/12/2016 1113       Impression and Plan: [name] [name] is a very pleasant 68 yo caucasian gentleman with polycythemia vera, JAK2 positive.  Everything looks quite good right now.  His platelet count is lower.  His splenic volume is lower.  He is doing well on the Jakafi .  I am very impressed with his blood counts and the fact that his spleen has decreased in size so much.  Think we can get him through the Summer now.   Ivor Mars, MD 6/5/20259:41 AM

## 2023-10-14 DIAGNOSIS — M25511 Pain in right shoulder: Secondary | ICD-10-CM | POA: Diagnosis not present

## 2023-10-15 DIAGNOSIS — R531 Weakness: Secondary | ICD-10-CM | POA: Diagnosis not present

## 2023-10-15 DIAGNOSIS — M25511 Pain in right shoulder: Secondary | ICD-10-CM | POA: Diagnosis not present

## 2023-10-15 DIAGNOSIS — Z4789 Encounter for other orthopedic aftercare: Secondary | ICD-10-CM | POA: Diagnosis not present

## 2023-10-17 ENCOUNTER — Other Ambulatory Visit: Payer: Self-pay

## 2023-10-17 DIAGNOSIS — R531 Weakness: Secondary | ICD-10-CM | POA: Diagnosis not present

## 2023-10-17 DIAGNOSIS — Z4789 Encounter for other orthopedic aftercare: Secondary | ICD-10-CM | POA: Diagnosis not present

## 2023-10-17 DIAGNOSIS — M25511 Pain in right shoulder: Secondary | ICD-10-CM | POA: Diagnosis not present

## 2023-10-17 NOTE — Progress Notes (Signed)
 Specialty Pharmacy Ongoing Clinical Assessment Note  [name] [name] is a 68 y.o. 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   Therapeutic benefit summary: Patient is achieving benefit   Adverse events/side effects summary: No adverse events/side effects   Patient's therapy is appropriate to: Continue    Goals Addressed             This Visit's Progress    Stabilization of disease   On track    Patient is on track. Patient will maintain adherence.  Dr. Maria Shiner notated at his office visit on 10/10/23 that he is doing well at this time and his platelet count and splenic volume were both decreased.          Follow up: 6 months  [facility]

## 2023-10-21 ENCOUNTER — Ambulatory Visit (HOSPITAL_COMMUNITY): Admitting: Physician Assistant

## 2023-10-21 ENCOUNTER — Telehealth (HOSPITAL_COMMUNITY): Payer: Self-pay

## 2023-10-21 NOTE — Telephone Encounter (Signed)
 Patient called in to let us  know he has been having A fib/Flutter since Friday. HR has been ranging around 115 while he is in A-fib. He has been taking his Diltiazem  30 mg as needed to help bring his HR down. The Diltiazem  30 mg has brought his HR down to in the 60's. Scheduled patient for appointment on 6/20 @ 1:30 pm to see Clint Fenton-PA. Consulted with patient and he verbalized understanding.

## 2023-10-23 DIAGNOSIS — M25511 Pain in right shoulder: Secondary | ICD-10-CM | POA: Diagnosis not present

## 2023-10-23 DIAGNOSIS — R531 Weakness: Secondary | ICD-10-CM | POA: Diagnosis not present

## 2023-10-23 DIAGNOSIS — Z4789 Encounter for other orthopedic aftercare: Secondary | ICD-10-CM | POA: Diagnosis not present

## 2023-10-24 ENCOUNTER — Ambulatory Visit: Payer: Medicare Other | Admitting: Adult Health

## 2023-10-25 ENCOUNTER — Ambulatory Visit (HOSPITAL_COMMUNITY)
Admission: RE | Admit: 2023-10-25 | Discharge: 2023-10-25 | Disposition: A | Discharge status: Home / Self Care | Source: Ambulatory Visit | Attending: Physician Assistant | Admitting: Physician Assistant

## 2023-10-25 ENCOUNTER — Encounter (HOSPITAL_COMMUNITY): Payer: Self-pay | Admitting: Physician Assistant

## 2023-10-25 VITALS — BP 106/80 | HR 58 | Ht 71.0 in | Wt 222.0 lb

## 2023-10-25 DIAGNOSIS — R531 Weakness: Secondary | ICD-10-CM | POA: Diagnosis not present

## 2023-10-25 DIAGNOSIS — I480 Paroxysmal atrial fibrillation: Secondary | ICD-10-CM

## 2023-10-25 DIAGNOSIS — Z4789 Encounter for other orthopedic aftercare: Secondary | ICD-10-CM | POA: Diagnosis not present

## 2023-10-25 DIAGNOSIS — D6869 Other thrombophilia: Secondary | ICD-10-CM

## 2023-10-25 DIAGNOSIS — M25511 Pain in right shoulder: Secondary | ICD-10-CM | POA: Diagnosis not present

## 2023-10-25 NOTE — Progress Notes (Signed)
 Primary Care Physician: Bertha Broad, MD Primary Cardiologist: Dr Audery Blazing  Primary Electrophysiologist: Dr Lawana Pray Referring Physician: Dr Abbey Abbe [name] [name] is a 68 y.o. male with a history of HTN, HLD, CVA, OSA, CAD, HLD, atrial fibrillation who presents for follow up in the [facility]. He presented to the hospital 10/17/2021 with rapid atrial fibrillation. He had sudden onset of brief cramping in his left upper chest. He developed palpitations and started feeling poorly. He converted to sinus rhythm while in the hospital without need for intervention. Patient is on Eliquis  for a CHADS2VASC score of 3. He was seen by Dr Lawana Pray and underwent afib ablation 04/13/22.  Patient returns for follow up for atrial fibrillation. He reports that 6/13 he started having tachypalpitations. They continued through the weekend and then resolved. He did take one dose of PRN diltiazem . There were no specific triggers that he could identify. No bleeding issues on anticoagulation.   Today, he  denies symptoms of chest pain, shortness of breath, orthopnea, PND, lower extremity edema, dizziness, presyncope, syncope, bleeding, or neurologic sequela. The patient is tolerating medications without difficulties and is otherwise without complaint today.    Atrial Fibrillation Risk Factors:  he does have symptoms or diagnosis of sleep apnea. he does not have a history of rheumatic fever.   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   Diverticulosis of colon (without mention of hemorrhage)    Dysrhythmia    A fib   Heart murmur    slight  no issues   Hemorrhoids    History of kidney stones    Hypertension    IBS  (irritable bowel syndrome)    Iron deficiency anemia due to chronic blood loss 09/26/2017   Irregular heartbeat    Neuromuscular disorder (HCC)    neuropathy legs and feet   Other and unspecified hyperlipidemia    Perirectal fistula    Polycythemia, secondary    Sleep apnea    mild cpap uses dental appliance   Stroke (HCC)    mild speech glich at times no other issues   Stye    blocker duct right eye lid    Current Outpatient Medications  Medication Sig Dispense Refill   acetaminophen  (TYLENOL ) 500 MG tablet Take 1,000 mg by mouth every 6 (six) hours as needed for mild pain.      amitriptyline  (ELAVIL ) 25 MG tablet Take 2 tablets (50 mg total) by mouth at bedtime. 180 tablet 3   apixaban  (ELIQUIS ) 5 MG TABS tablet Take 1 tablet (5 mg total) by mouth 2 (two) times daily. 180 tablet 1   aspirin  EC 81 MG EC tablet Take 1 tablet (81 mg total) by mouth daily. 60 tablet 0   diltiazem  (CARDIZEM  CD) 120 MG 24 hr capsule Take 1 capsule (120 mg total) by mouth daily. 90 capsule 3   diltiazem  (CARDIZEM ) 30 MG tablet Take 1 tablet every 4 hours AS NEEDED for AFIB heart rate >100 as long as top BP >100. 30 tablet 1   fluocinonide (LIDEX) 0.05 % external solution Apply 1 Application topically.     fluorouracil (EFUDEX) 5 % cream Apply 1 application  topically 2 (two) times daily as needed (dry skin).  gabapentin  (NEURONTIN ) 300 MG capsule Take  2 capsules (600mg  )  by mouth in morning , 2 capsules (600 mg) in afternoon and 1 capsule (300mg )  at bedtime 450 capsule 3   GEMTESA 75 MG TABS Take 1 tablet by mouth daily.     losartan  (COZAAR ) 100 MG tablet Take 1 tablet (100 mg total) by mouth daily. 90 tablet 3   Melatonin 10 MG TABS Take 10 mg by mouth at bedtime.     Multiple Vitamin (MULTIVITAMIN WITH MINERALS) TABS tablet Take 1 tablet by mouth in the morning.     propranolol  ER (INDERAL  LA) 160 MG SR capsule TAKE 1 CAPSULE BY MOUTH DAILY 30 capsule 0   rosuvastatin  (CRESTOR ) 20 MG tablet Take 1  tablet (20 mg total) by mouth daily. 90 tablet 3   ruxolitinib  phosphate (JAKAFI ) 10 MG tablet TAKE 1 TABLET BY MOUTH 2 TIMES DAILY. 60 tablet 4   tadalafil (CIALIS) 5 MG tablet Take 5 mg by mouth every other day. In the morning.     zolpidem  (AMBIEN  CR) 12.5 MG CR tablet Take 6.25 mg by mouth at bedtime as needed for sleep.     No current facility-administered medications for this encounter.    ROS- All systems are reviewed and negative except as per the HPI above.  Physical Exam: Vitals:   10/25/23 1302  BP: 106/80  Pulse: (!) 58  Weight: 100.7 kg  Height: 5' 11 (1.803 m)     GEN: Well nourished, well developed in no acute distress CARDIAC: Regular rate and rhythm, no murmurs, rubs, gallops RESPIRATORY:  Clear to auscultation without rales, wheezing or rhonchi  ABDOMEN: Soft, non-tender, non-distended EXTREMITIES:  No edema; No deformity    Wt Readings from Last 3 Encounters:  10/25/23 100.7 kg  10/10/23 100.4 kg  07/22/23 100.6 kg    EKG today demonstrates  SB, LAFB Vent. rate 58 BPM PR interval 188 ms QRS duration 118 ms QT/QTcB 438/429 ms   Echo 09/07/21 demonstrated   1. Left ventricular ejection fraction, by estimation, is 55 to 60%. Left  ventricular ejection fraction by 3D volume is 58 %. The left ventricle has  normal function. The left ventricle has no regional wall motion  abnormalities. Left ventricular diastolic parameters are consistent with Grade I diastolic dysfunction (impaired relaxation). The average left ventricular global longitudinal strain is -21.3 %. The global longitudinal strain is normal.   2. Right ventricular systolic function is normal. The right ventricular  size is mildly enlarged. Tricuspid regurgitation signal is inadequate for  assessing PA pressure.   3. The mitral valve is normal in structure. Trivial mitral valve  regurgitation. No evidence of mitral stenosis.   4. The aortic valve is normal in structure. Aortic valve  regurgitation is  not visualized. No aortic stenosis is present.   5. Aortic dilatation noted. There is mild dilatation of the ascending  aorta, measuring 39 mm.   Comparison(s): 09/13/18 EF 55-60%.   Epic records are reviewed at length today  CHA2DS2-VASc Score = 5  The patient's score is based upon: CHF History: 0 HTN History: 1 Diabetes History: 0 Stroke History: 2 Vascular Disease History: 1 Age Score: 1 Gender Score: 0       ASSESSMENT AND PLAN: Paroxysmal Atrial Fibrillation (ICD10:  I48.0) The patient's CHA2DS2-VASc score is 5, indicating a 7.2% annual risk of stroke.   S/p afib ablation 04/13/22 He had 3 days of intermittent palpitations about one week ago, none since. We discussed rhythm  control options today (flecainide). He would like pursue watchful waiting for now.  Continue Eliquis  5 mg BID Continue diltiazem  120 mg daily with 30 mg PRN q 4 hours for heart racing Continue propranolol  180 mg daily  Secondary Hypercoagulable State (ICD10:  D68.69) The patient is at significant risk for stroke/thromboembolism based upon his CHA2DS2-VASc Score of 5.  Continue Apixaban  (Eliquis ). No bleeding issues.   HTN Stable on current regimen  OSA  Encouraged nightly use of oral device    Follow up in the AF clinic in 6 months.     Myrtha Ates PA-C [facility] [address] [place], [place] [address] [phone] 10/25/2023 1:28 PM

## 2023-10-29 ENCOUNTER — Other Ambulatory Visit (HOSPITAL_COMMUNITY): Payer: Self-pay

## 2023-11-01 ENCOUNTER — Other Ambulatory Visit (HOSPITAL_COMMUNITY): Payer: Self-pay

## 2023-11-04 ENCOUNTER — Other Ambulatory Visit: Payer: Self-pay | Admitting: Cardiology

## 2023-11-04 ENCOUNTER — Other Ambulatory Visit: Payer: Self-pay

## 2023-11-04 ENCOUNTER — Other Ambulatory Visit (HOSPITAL_COMMUNITY): Payer: Self-pay

## 2023-11-04 DIAGNOSIS — R002 Palpitations: Secondary | ICD-10-CM

## 2023-11-04 NOTE — Progress Notes (Signed)
 Specialty Pharmacy Refill Coordination Note  Spoke with [name] [name] (Self)   [name] [name] is a 68 y.o. male contacted today regarding refills of specialty medication(s) Ruxolitinib  Phosphate (JAKAFI )  Patient requested: Delivery   Delivery date: 11/06/23   Verified address: [address]  [place] [id number]  Medication will be filled on 11/05/23.

## 2023-11-06 DIAGNOSIS — M25511 Pain in right shoulder: Secondary | ICD-10-CM | POA: Diagnosis not present

## 2023-11-06 DIAGNOSIS — R531 Weakness: Secondary | ICD-10-CM | POA: Diagnosis not present

## 2023-11-06 DIAGNOSIS — Z4789 Encounter for other orthopedic aftercare: Secondary | ICD-10-CM | POA: Diagnosis not present

## 2023-11-06 DIAGNOSIS — M7712 Lateral epicondylitis, left elbow: Secondary | ICD-10-CM | POA: Diagnosis not present

## 2023-11-11 ENCOUNTER — Encounter: Payer: Self-pay | Admitting: Physician Assistant

## 2023-11-11 DIAGNOSIS — N5201 Erectile dysfunction due to arterial insufficiency: Secondary | ICD-10-CM | POA: Diagnosis not present

## 2023-11-11 DIAGNOSIS — N401 Enlarged prostate with lower urinary tract symptoms: Secondary | ICD-10-CM | POA: Diagnosis not present

## 2023-11-11 DIAGNOSIS — R531 Weakness: Secondary | ICD-10-CM | POA: Diagnosis not present

## 2023-11-11 DIAGNOSIS — M25511 Pain in right shoulder: Secondary | ICD-10-CM | POA: Diagnosis not present

## 2023-11-11 DIAGNOSIS — R3914 Feeling of incomplete bladder emptying: Secondary | ICD-10-CM | POA: Diagnosis not present

## 2023-11-11 DIAGNOSIS — Z4789 Encounter for other orthopedic aftercare: Secondary | ICD-10-CM | POA: Diagnosis not present

## 2023-11-11 NOTE — Patient Instructions (Signed)
 Below is our plan:  Goals:  1) Maintain strict control of hypertension with blood pressure goal below 130/90 2) Maintain good control of diabetes with hemoglobin A1c goal below 7%  3) Maintain good control of lipids with LDL cholesterol goal below 70 mg/dL.  4) Eat a healthy diet with plenty of whole grains, cereals, fruits and vegetables, exercise regularly and maintain ideal body weight   Resources: https://www.williams.biz/  Please make sure you are staying well hydrated. I recommend 50-60 ounces daily. Well balanced diet and regular exercise encouraged. Consistent sleep schedule with 6-8 hours recommended.   Please continue follow up with care team as directed.   Follow up with *** in ***  You may receive a survey regarding today's visit. I encourage you to leave honest feed back as I do use this information to improve patient care. Thank you for seeing me today!    GENERAL HEADACHE INFORMATION:   Natural supplements: Magnesium  Oxide or Magnesium  Glycinate 500 mg at bed (up to 800 mg daily) Coenzyme Q10 300 mg in AM Vitamin B2- 200 mg twice a day   Add 1 supplement at a time since even natural supplements can have undesirable side effects. You can sometimes buy supplements cheaper (especially Coenzyme Q10) at www.WebmailGuide.co.za or at [facility].  Migraine with aura: There is increased risk for stroke in women with migraine with aura and a contraindication for the combined contraceptive pill for use by women who have migraine with aura. The risk for women with migraine without aura is lower. However other risk factors like smoking are far more likely to increase stroke risk than migraine. There is a recommendation for no smoking and for the use of OCPs without estrogen such as progestogen only pills particularly for women with migraine with aura.SABRA People who have migraine headaches with auras may be 3 times more likely to have a  stroke caused by a blood clot, compared to migraine patients who don't see auras. Women who take hormone-replacement therapy may be 30 percent more likely to suffer a clot-based stroke than women not taking medication containing estrogen. Other risk factors like smoking and high blood pressure may be  much more important.    Vitamins and herbs that show potential:   Magnesium : Magnesium  (250 mg twice a day or 500 mg at bed) has a relaxant effect on smooth muscles such as blood vessels. Individuals suffering from frequent or daily headache usually have low magnesium  levels which can be increase with daily supplementation of 400-750 mg. Three trials found 40-90% average headache reduction  when used as a preventative. Magnesium  may help with headaches are aura, the best evidence for magnesium  is for migraine with aura is its thought to stop the cortical spreading depression we believe is the pathophysiology of migraine aura.Magnesium  also demonstrated the benefit in menstrually related migraine.  Magnesium  is part of the messenger system in the serotonin cascade and it is a good muscle relaxant.  It is also useful for constipation which can be a side effect of other medications used to treat migraine. Good sources include nuts, whole grains, and tomatoes. Side Effects: loose stool/diarrhea  Riboflavin (vitamin B 2) 200 mg twice a day. This vitamin assists nerve cells in the production of ATP a principal energy storing molecule.  It is necessary for many chemical reactions in the body.  There have been at least 3 clinical trials of riboflavin using 400 mg per day all of which suggested that migraine frequency can be decreased.  All  3 trials showed significant improvement in over half of migraine sufferers.  The supplement is found in bread, cereal, milk, meat, and poultry.  Most Americans get more riboflavin than the recommended daily allowance, however riboflavin deficiency is not necessary for the supplements  to help prevent headache. Side effects: energizing, green urine   Coenzyme Q10: This is present in almost all cells in the body and is critical component for the conversion of energy.  Recent studies have shown that a nutritional supplement of CoQ10 can reduce the frequency of migraine attacks by improving the energy production of cells as with riboflavin.  Doses of 150 mg twice a day have been shown to be effective.   Melatonin: Increasing evidence shows correlation between melatonin secretion and headache conditions.  Melatonin supplementation has decreased headache intensity and duration.  It is widely used as a sleep aid.  Sleep is natures way of dealing with migraine.  A dose of 3 mg is recommended to start for headaches including cluster headache. Higher doses up to 15 mg has been reviewed for use in Cluster headache and have been used. The rationale behind using melatonin for cluster is that many theories regarding the cause of Cluster headache center around the disruption of the normal circadian rhythm in the brain.  This helps restore the normal circadian rhythm.   HEADACHE DIET: Foods and beverages which may trigger migraine Note that only 20% of headache patients are food sensitive. You will know if you are food sensitive if you get a headache consistently 20 minutes to 2 hours after eating a certain food. Only cut out a food if it causes headaches, otherwise you might remove foods you enjoy! What matters most for diet is to eat a well balanced healthy diet full of vegetables and low fat protein, and to not miss meals.   Chocolate, other sweets ALL cheeses except cottage and cream cheese Dairy products, yogurt, sour cream, ice cream Liver Meat extracts (Bovril, Marmite, meat tenderizers) Meats or fish which have undergone aging, fermenting, pickling or smoking. These include: Hotdogs,salami,Lox,sausage, mortadellas,smoked salmon, pepperoni, Pickled herring Pods of broad bean (English  beans, Chinese pea pods, [place] (fava) beans, lima and navy beans Ripe avocado, ripe banana Yeast extracts or active yeast preparations such as Brewer's or Fleishman's (commercial bakes goods are permitted) Tomato based foods, pizza (lasagna, etc.)   MSG (monosodium glutamate) is disguised as many things; look for these common aliases: Monopotassium glutamate Autolysed yeast Hydrolysed protein Sodium caseinate "flavorings" "all natural preservatives Nutrasweet   Avoid all other foods that convincingly provoke headaches.   Resources: The Dizzy Bluford Aid Your Headache Diet, migrainestrong.com  https://zamora-andrews.com/   Caffeine and Migraine For patients that have migraine, caffeine intake more than 3 days per week can lead to dependency and increased migraine frequency. I would recommend cutting back on your caffeine intake as best you can. The recommended amount of caffeine is 200-300 mg daily, although migraine patients may experience dependency at even lower doses. While you may notice an increase in headache temporarily, cutting back will be helpful for headaches in the long run. For more information on caffeine and migraine, visit: https://americanmigrainefoundation.org/resource-library/caffeine-and-migraine/   Headache Prevention Strategies:   1. Maintain a headache diary; learn to identify and avoid triggers.  - This can be a simple note where you log when you had a headache, associated symptoms, and medications used - There are several smartphone apps developed to help track migraines: Migraine Buddy, Migraine Monitor, Curelator N1-Headache App   Common  triggers include: Emotional triggers: Emotional/Upset family or friends Emotional/Upset occupation Business reversal/success Anticipation anxiety Crisis-serious Post-crisis periodNew job/position   Physical triggers: Vacation Day Weekend Strenuous Exercise High  Altitude Location New Move Menstrual Day Physical Illness Oversleep/Not enough sleep Weather changes Light: Photophobia or light sesnitivity treatment involves a balance between desensitization and reduction in overly strong input. Use dark polarized glasses outside, but not inside. Avoid bright or fluorescent light, but do not dim environment to the point that going into a normally lit room hurts. Consider FL-41 tint lenses, which reduce the most irritating wavelengths without blocking too much light.  These can be obtained at axonoptics.com or theraspecs.com Foods: see list above.   2. Limit use of acute treatments (over-the-counter medications, triptans, etc.) to no more than 2 days per week or 10 days per month to prevent medication overuse headache (rebound headache).     3. Follow a regular schedule (including weekends and holidays): Don't skip meals. Eat a balanced diet. 8 hours of sleep nightly. Minimize stress. Exercise 30 minutes per day. Being overweight is associated with a 5 times increased risk of chronic migraine. Keep well hydrated and drink 6-8 glasses of water per day.   4. Initiate non-pharmacologic measures at the earliest onset of your headache. Rest and quiet environment. Relax and reduce stress. Breathe2Relax is a free app that can instruct you on    some simple relaxtion and breathing techniques. Http://Dawnbuse.com is a    free website that provides teaching videos on relaxation.  Also, there are  many apps that   can be downloaded for "mindful" relaxation.  An app called YOGA NIDRA will help walk you through mindfulness. Another app called Calm can be downloaded to give you a structured mindfulness guide with daily reminders and skill development. Headspace for guided meditation Mindfulness Based Stress Reduction Online Course: www.palousemindfulness.com Cold compresses.   5. Don't wait!! Take the maximum allowable dosage of prescribed medication at the first sign of  migraine.   6. Compliance:  Take prescribed medication regularly as directed and at the first sign of a migraine.   7. Communicate:  Call your physician when problems arise, especially if your headaches change, increase in frequency/severity, or become associated with neurological symptoms (weakness, numbness, slurred speech, etc.). Proceed to emergency room if you experience new or worsening symptoms or symptoms do not resolve, if you have new neurologic symptoms or if headache is severe, or for any concerning symptom.   8. Headache/pain management therapies: Consider various complementary methods, including medication, behavioral therapy, psychological counselling, biofeedback, massage therapy, acupuncture, dry needling, and other modalities.  Such measures may reduce the need for medications. Counseling for pain management, where patients learn to function and ignore/minimize their pain, seems to work very well.   9. Recommend changing family's attention and focus away from patient's headaches. Instead, emphasize daily activities. If first question of day is 'How are your headaches/Do you have a headache today?', then patient will constantly think about headaches, thus making them worse. Goal is to re-direct attention away from headaches, toward daily activities and other distractions.   10. Helpful Websites: www.AmericanHeadacheSociety.org PatentHood.ch www.headaches.org TightMarket.nl www.achenet.org

## 2023-11-11 NOTE — Progress Notes (Unsigned)
 No chief complaint on file.   HISTORY OF PRESENT ILLNESS:  11/11/23 ALL:  [name] [name] presents today for follow up for CVA, neuropathy and headaches. He was last seen by Harlene Bogaert, NP 04/2023. Amitriptyline  was increased to 50mg  daily d/t worsening neuropathy and more frequent headaches. He continued gabapentin  600mg  BID.   Neuropathy  Headaches   He continues asa 81mg  and rosuvastatin  daily for stroke prevention. On Eliquis  for atrial fib.   He continues to use dental appliance for OSA. Managed by dentist.   09/25/2022 ALL:  [name] [name] is a 67 y.o. male here today for follow up for CVA, neuropathy and headaches. He continues gabapentin  600mg  BID and amitriptyline  25mg  daily. He continues asa 81mg  and rosuvastatin  daily for stroke prevention. On Eliquis  for atrial fib.   He reports neuropathy is stable. He continues to have some discomfort L>R, especially at night. No difficulty walking. He is s/p left CTR two weeks ago. He feels he is recovering well. He continues to have numbness of left thumb. Thought to be related to osteoarthritis.   Headaches are well managed. He has 1-2 tension style headaches a month, on average. He feels headaches have improved since retiring. He may have to take Tylenol  3-4 times a month. He continues to have random sharp shoot pain in the left temporal region that comes and goes. Pain lasts a few seconds then resolves. May happen 1-2 times per month.   He continues to have difficulty with word finding difficulty. No difficulty with memory. Able to manage home, drive and perform ADLs without difficulty. He admits to having some anxiety related to health history. Sleep waxes and wanes. Occasionally He continues to use oral appliance for management of OSA. He alternates Ambien  and hydroxyzine for insomnia.   03/14/2022 JM: Patient returns for 81-month follow-up unaccompanied.   Overall stable from stroke standpoint.  No new stroke/TIA symptoms.  Remains  on Eliquis , aspirin  and Crestor .  Blood pressure well controlled.  Routinely follows with PCP Dr. Yolande and cardiologist Dr. Pietro.   Continued mild headaches approximately 2-3 times per week.  Usually not debilitating or severe.  Continued occasional sharp zap type sensation but less frequent.  Remains on gabapentin .    Continued L>R LE neuropathy, more bothersome at night.  Denies being debilitating or interfering with daily functioning but more of a discomfort.   Nightly use of dental device, continues to follow with dentistry   REVIEW OF SYSTEMS: Out of a complete 14 system review of symptoms, the patient complains only of the following symptoms, headaches, word finding difficulty, neuropathy, insomnia and all other reviewed systems are negative.   ALLERGIES: Allergies  Allergen Reactions   Trazodone Hcl Other (See Comments)    Other reaction(s): too sleepy in AM   Mirtazapine Other (See Comments)    Other reaction(s): too sleepy in the AM  Other Reaction(s): Other (See Comments)    Other reaction(s): too sleepy in the AM   Niacin Itching and Other (See Comments)    Flushed feeling  Other Reaction(s): Other (See Comments)    Flushed feeling     HOME MEDICATIONS: Outpatient Medications Prior to Visit  Medication Sig Dispense Refill   acetaminophen  (TYLENOL ) 500 MG tablet Take 1,000 mg by mouth every 6 (six) hours as needed for mild pain.      amitriptyline  (ELAVIL ) 25 MG tablet Take 2 tablets (50 mg total) by mouth at bedtime. 180 tablet 3   apixaban  (ELIQUIS ) 5 MG TABS tablet  Take 1 tablet (5 mg total) by mouth 2 (two) times daily. 180 tablet 1   aspirin  EC 81 MG EC tablet Take 1 tablet (81 mg total) by mouth daily. 60 tablet 0   diltiazem  (CARDIZEM  CD) 120 MG 24 hr capsule Take 1 capsule (120 mg total) by mouth daily. 90 capsule 3   diltiazem  (CARDIZEM ) 30 MG tablet Take 1 tablet every 4 hours AS NEEDED for AFIB heart rate >100 as long as top BP >100. 30 tablet 1    fluocinonide (LIDEX) 0.05 % external solution Apply 1 Application topically.     fluorouracil (EFUDEX) 5 % cream Apply 1 application  topically 2 (two) times daily as needed (dry skin).     gabapentin  (NEURONTIN ) 300 MG capsule Take  2 capsules (600mg  )  by mouth in morning , 2 capsules (600 mg) in afternoon and 1 capsule (300mg )  at bedtime 450 capsule 3   GEMTESA 75 MG TABS Take 1 tablet by mouth daily.     losartan  (COZAAR ) 100 MG tablet Take 1 tablet (100 mg total) by mouth daily. 90 tablet 3   Melatonin 10 MG TABS Take 10 mg by mouth at bedtime.     Multiple Vitamin (MULTIVITAMIN WITH MINERALS) TABS tablet Take 1 tablet by mouth in the morning.     propranolol  ER (INDERAL  LA) 160 MG SR capsule Take 1 capsule (160 mg total) by mouth daily. 15 capsule 0   rosuvastatin  (CRESTOR ) 20 MG tablet Take 1 tablet (20 mg total) by mouth daily. 90 tablet 3   ruxolitinib  phosphate (JAKAFI ) 10 MG tablet TAKE 1 TABLET BY MOUTH 2 TIMES DAILY. 60 tablet 4   tadalafil (CIALIS) 5 MG tablet Take 5 mg by mouth every other day. In the morning.     zolpidem  (AMBIEN  CR) 12.5 MG CR tablet Take 6.25 mg by mouth at bedtime as needed for sleep.     No facility-administered medications prior to visit.     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   Diverticulosis of colon (without mention of hemorrhage)    Dysrhythmia    A fib   Heart murmur    slight  no issues   Hemorrhoids    History of kidney stones    Hypertension    IBS (irritable bowel syndrome)    Iron deficiency anemia due to chronic blood loss 09/26/2017   Irregular heartbeat    Neuromuscular disorder (HCC)    neuropathy legs and feet   Other and unspecified hyperlipidemia    Perirectal fistula    Polycythemia, secondary    Sleep apnea    mild cpap uses dental appliance   Stroke (HCC)    mild speech glich at times no other issues   Stye     blocker duct right eye lid     PAST SURGICAL HISTORY: Past Surgical History:  Procedure Laterality Date   ATRIAL FIBRILLATION ABLATION N/A 04/13/2022   Procedure: ATRIAL FIBRILLATION ABLATION;  Surgeon: Inocencio Soyla Lunger, MD;  Location: [facility];  Service: Cardiovascular;  Laterality: N/A;   CARPAL TUNNEL RELEASE Left 09/2022   RETINAL DETACHMENT SURGERY  2005   REVERSE SHOULDER ARTHROPLASTY Right 07/18/2023   Procedure: ARTHROPLASTY, SHOULDER, TOTAL, REVERSE;  Surgeon: Dozier Soulier, MD;  Location: [facility];  Service: Orthopedics;  Laterality: Right;   ROTATOR CUFF REPAIR  1995  rt.   VASECTOMY     1991-or 1992     FAMILY HISTORY: Family History  Problem Relation Age of Onset   Aortic aneurysm Father    Heart attack Brother    Cerebral palsy Brother    Colon cancer Neg Hx    Esophageal cancer Neg Hx    Pancreatic cancer Neg Hx    Prostate cancer Neg Hx    Rectal cancer Neg Hx    Stomach cancer Neg Hx      SOCIAL HISTORY: Social History   Socioeconomic History   Marital status: Married    Spouse name: Avelina   Number of children: 2   Years of education: Not on file   Highest education level: Not on file  Occupational History   Occupation: Garment/textile technologist: [employer]  Tobacco Use   Smoking status: Never   Smokeless tobacco: Never   Tobacco comments:    Never smoked 06/03/23  Vaping Use   Vaping status: Never Used  Substance and Sexual Activity   Alcohol use: Yes    Alcohol/week: 2.0 standard drinks of alcohol    Types: 1 Cans of beer, 1 Shots of liquor per week    Comment: 3-4 mixed drinks or beer 10/25/23   Drug use: No   Sexual activity: Yes  Other Topics Concern   Not on file  Social History Narrative   Lives with wife   Social Drivers of Corporate investment banker Strain: Not on file  Food Insecurity: Low Risk  (08/26/2023)   Received from [facility]   Hunger Vital Sign    Within the past 12 months, you worried  that your food would run out before you got money to buy more: Never true    Within the past 12 months, the food you bought just didn't last and you didn't have money to get more. : Never true  Transportation Needs: No Transportation Needs (08/26/2023)   Received from [facility]    In the past 12 months, has lack of reliable transportation kept you from medical appointments, meetings, work or from getting things needed for daily living? : No  Physical Activity: Not on file  Stress: Not on file  Social Connections: Not on file  Intimate Partner Violence: Not on file     PHYSICAL EXAM  There were no vitals filed for this visit.  There is no height or weight on file to calculate BMI.  Generalized: Well developed, in no acute distress  Cardiology: normal rate and rhythm, no murmur auscultated  Respiratory: clear to auscultation bilaterally    Neurological examination  Mentation: Alert oriented to time, place, history taking. Follows all commands speech and language fluent Cranial nerve II-XII: Pupils were equal round reactive to light. Extraocular movements were full, visual field were full on confrontational test. Facial sensation and strength were normal. Uvula tongue midline. Head turning and shoulder shrug  were normal and symmetric. Motor: The motor testing reveals 5 over 5 strength of all 4 extremities. Good symmetric motor tone is noted throughout.  Sensory: Sensory testing is intact to soft touch on all 4 extremities. No evidence of extinction is noted.  Coordination: Cerebellar testing reveals good finger-nose-finger and heel-to-shin bilaterally.  Gait and station: Gait is normal.  Reflexes: Deep tendon reflexes are symmetric and normal bilaterally.    DIAGNOSTIC DATA (LABS, IMAGING, TESTING) - I reviewed patient records, labs, notes, testing and imaging myself where available.  Lab  Results  Component Value Date   WBC 9.8 10/10/2023   HGB 11.8 (L)  10/10/2023   HCT 35.6 (L) 10/10/2023   MCV 83.4 10/10/2023   PLT 364 10/10/2023      Component Value Date/Time   NA 140 10/10/2023 0840   NA 144 04/25/2017 1506   NA 139 04/12/2016 1113   K 4.3 10/10/2023 0840   K 4.2 04/25/2017 1506   K 4.1 04/12/2016 1113   CL 105 10/10/2023 0840   CL 105 04/25/2017 1506   CO2 29 10/10/2023 0840   CO2 29 04/25/2017 1506   CO2 22 04/12/2016 1113   GLUCOSE 97 10/10/2023 0840   GLUCOSE 95 04/25/2017 1506   BUN 22 10/10/2023 0840   BUN 17 04/25/2017 1506   BUN 16.3 04/12/2016 1113   CREATININE 1.27 (H) 10/10/2023 0840   CREATININE 1.2 04/25/2017 1506   CREATININE 0.9 04/12/2016 1113   CALCIUM  9.2 10/10/2023 0840   CALCIUM  9.5 04/25/2017 1506   CALCIUM  9.1 04/12/2016 1113   PROT 6.6 10/10/2023 0840   PROT 6.4 04/25/2017 1506   PROT 7.0 04/12/2016 1113   ALBUMIN 4.8 10/10/2023 0840   ALBUMIN 3.8 04/25/2017 1506   ALBUMIN 4.6 09/12/2016 1457   ALBUMIN 4.2 04/12/2016 1113   AST 26 10/10/2023 0840   AST 31 04/12/2016 1113   ALT 20 10/10/2023 0840   ALT 58 (H) 04/25/2017 1506   ALT 36 04/12/2016 1113   ALKPHOS 57 10/10/2023 0840   ALKPHOS 73 04/25/2017 1506   ALKPHOS 66 04/12/2016 1113   BILITOT 0.9 10/10/2023 0840   BILITOT 0.73 04/12/2016 1113   GFRNONAA >60 10/10/2023 0840   GFRAA >60 12/24/2019 0944   Lab Results  Component Value Date   CHOL 116 12/22/2021   HDL 38 (L) 12/22/2021   LDLCALC 52 12/22/2021   TRIG 155 (H) 12/22/2021   CHOLHDL 3.1 12/22/2021   Lab Results  Component Value Date   HGBA1C 4.8 09/13/2018   Lab Results  Component Value Date   VITAMINB12 1,133 08/22/2020   Lab Results  Component Value Date   TSH 1.713 10/17/2021        No data to display               No data to display           ASSESSMENT AND PLAN  68 y.o. year old male  has a past medical history of Arthritis, Cancer (HCC), Diverticulosis of colon (without mention of hemorrhage), Dysrhythmia, Heart murmur, Hemorrhoids,  History of kidney stones, Hypertension, IBS (irritable bowel syndrome), Iron deficiency anemia due to chronic blood loss (09/26/2017), Irregular heartbeat, Neuromuscular disorder (HCC), Other and unspecified hyperlipidemia, Perirectal fistula, Polycythemia, secondary, Sleep apnea, Stroke (HCC), and Stye. here with    No diagnosis found.  [name] [name] reports symptoms are stable. No new TIA/stroke like symptoms. We will continue gabapentin  600mg  BID and amitriptyline  25mg  QHS. We have reviewed concerns of word finding difficulty. Likely multifactorial. We have discussed weaning sedating meds in the future if possible. No concerns of dementia raised. He was encouraged to use memory compensation strategies and focus on regular physical and mental exercise. Consider eval of OSA on oral appliance. Healthy lifestyle habits encouraged. He will follow up with PCP as directed. He will return to see Harlene in 6 months, sooner if needed. He verbalizes understanding and agreement with this plan.   No orders of the defined types were placed in this encounter.    No orders of  the defined types were placed in this encounter.    Greig Forbes, MSN, FNP-C 11/11/2023, 2:35 PM  [facility] [address], [address] [place], [place] [address] [phone]

## 2023-11-12 ENCOUNTER — Encounter: Payer: Self-pay | Admitting: Family Medicine

## 2023-11-12 ENCOUNTER — Ambulatory Visit: Admitting: Family Medicine

## 2023-11-12 VITALS — BP 120/73 | HR 62 | Ht 71.0 in | Wt 222.8 lb

## 2023-11-12 DIAGNOSIS — G44221 Chronic tension-type headache, intractable: Secondary | ICD-10-CM | POA: Diagnosis not present

## 2023-11-12 DIAGNOSIS — M79671 Pain in right foot: Secondary | ICD-10-CM

## 2023-11-12 DIAGNOSIS — G629 Polyneuropathy, unspecified: Secondary | ICD-10-CM

## 2023-11-12 DIAGNOSIS — G4730 Sleep apnea, unspecified: Secondary | ICD-10-CM

## 2023-11-12 DIAGNOSIS — R4789 Other speech disturbances: Secondary | ICD-10-CM

## 2023-11-12 DIAGNOSIS — Z8673 Personal history of transient ischemic attack (TIA), and cerebral infarction without residual deficits: Secondary | ICD-10-CM | POA: Diagnosis not present

## 2023-11-12 DIAGNOSIS — M79672 Pain in left foot: Secondary | ICD-10-CM

## 2023-11-13 DIAGNOSIS — M7712 Lateral epicondylitis, left elbow: Secondary | ICD-10-CM | POA: Diagnosis not present

## 2023-11-13 DIAGNOSIS — Z4789 Encounter for other orthopedic aftercare: Secondary | ICD-10-CM | POA: Diagnosis not present

## 2023-11-13 DIAGNOSIS — R531 Weakness: Secondary | ICD-10-CM | POA: Diagnosis not present

## 2023-11-13 DIAGNOSIS — M25511 Pain in right shoulder: Secondary | ICD-10-CM | POA: Diagnosis not present

## 2023-11-14 DIAGNOSIS — E785 Hyperlipidemia, unspecified: Secondary | ICD-10-CM | POA: Diagnosis not present

## 2023-11-14 DIAGNOSIS — Z8673 Personal history of transient ischemic attack (TIA), and cerebral infarction without residual deficits: Secondary | ICD-10-CM | POA: Diagnosis not present

## 2023-11-14 DIAGNOSIS — I10 Essential (primary) hypertension: Secondary | ICD-10-CM | POA: Diagnosis not present

## 2023-11-14 DIAGNOSIS — G4733 Obstructive sleep apnea (adult) (pediatric): Secondary | ICD-10-CM | POA: Diagnosis not present

## 2023-11-18 ENCOUNTER — Other Ambulatory Visit: Payer: Self-pay | Admitting: Cardiology

## 2023-11-18 DIAGNOSIS — R002 Palpitations: Secondary | ICD-10-CM

## 2023-11-18 NOTE — Telephone Encounter (Signed)
 This is a A-Fib clinic pt that was just seen in the clinic. Please address

## 2023-11-19 DIAGNOSIS — R531 Weakness: Secondary | ICD-10-CM | POA: Diagnosis not present

## 2023-11-19 DIAGNOSIS — M7712 Lateral epicondylitis, left elbow: Secondary | ICD-10-CM | POA: Diagnosis not present

## 2023-11-19 DIAGNOSIS — M25511 Pain in right shoulder: Secondary | ICD-10-CM | POA: Diagnosis not present

## 2023-11-19 DIAGNOSIS — Z4789 Encounter for other orthopedic aftercare: Secondary | ICD-10-CM | POA: Diagnosis not present

## 2023-11-27 DIAGNOSIS — Z4789 Encounter for other orthopedic aftercare: Secondary | ICD-10-CM | POA: Diagnosis not present

## 2023-11-27 DIAGNOSIS — M7712 Lateral epicondylitis, left elbow: Secondary | ICD-10-CM | POA: Diagnosis not present

## 2023-11-27 DIAGNOSIS — M25511 Pain in right shoulder: Secondary | ICD-10-CM | POA: Diagnosis not present

## 2023-11-27 DIAGNOSIS — R531 Weakness: Secondary | ICD-10-CM | POA: Diagnosis not present

## 2023-11-29 DIAGNOSIS — M7712 Lateral epicondylitis, left elbow: Secondary | ICD-10-CM | POA: Diagnosis not present

## 2023-11-29 DIAGNOSIS — M25511 Pain in right shoulder: Secondary | ICD-10-CM | POA: Diagnosis not present

## 2023-11-29 DIAGNOSIS — Z4789 Encounter for other orthopedic aftercare: Secondary | ICD-10-CM | POA: Diagnosis not present

## 2023-11-29 DIAGNOSIS — R531 Weakness: Secondary | ICD-10-CM | POA: Diagnosis not present

## 2023-12-02 ENCOUNTER — Ambulatory Visit: Admitting: Family Medicine

## 2023-12-02 ENCOUNTER — Ambulatory Visit (HOSPITAL_COMMUNITY): Payer: Medicare Other | Admitting: Physician Assistant

## 2023-12-03 DIAGNOSIS — C44319 Basal cell carcinoma of skin of other parts of face: Secondary | ICD-10-CM | POA: Diagnosis not present

## 2023-12-03 DIAGNOSIS — C44311 Basal cell carcinoma of skin of nose: Secondary | ICD-10-CM | POA: Diagnosis not present

## 2023-12-03 DIAGNOSIS — D170 Benign lipomatous neoplasm of skin and subcutaneous tissue of head, face and neck: Secondary | ICD-10-CM | POA: Diagnosis not present

## 2023-12-03 DIAGNOSIS — M950 Acquired deformity of nose: Secondary | ICD-10-CM | POA: Diagnosis not present

## 2023-12-03 DIAGNOSIS — Z483 Aftercare following surgery for neoplasm: Secondary | ICD-10-CM | POA: Diagnosis not present

## 2023-12-03 DIAGNOSIS — Z85828 Personal history of other malignant neoplasm of skin: Secondary | ICD-10-CM | POA: Diagnosis not present

## 2023-12-03 DIAGNOSIS — Z9889 Other specified postprocedural states: Secondary | ICD-10-CM | POA: Diagnosis not present

## 2023-12-04 ENCOUNTER — Other Ambulatory Visit: Payer: Self-pay

## 2023-12-04 ENCOUNTER — Encounter (INDEPENDENT_AMBULATORY_CARE_PROVIDER_SITE_OTHER): Payer: Self-pay

## 2023-12-04 NOTE — Progress Notes (Signed)
 Specialty Pharmacy Refill Coordination Note  [name] [name] is a 68 y.o. male contacted today regarding refills of specialty medication(s) Ruxolitinib  Phosphate (JAKAFI )   Patient requested Delivery   Delivery date: 12/09/23   Verified address: [address] [place] [address]   Medication will be filled on 12/06/23.

## 2023-12-05 ENCOUNTER — Other Ambulatory Visit (HOSPITAL_COMMUNITY): Payer: Self-pay

## 2023-12-05 ENCOUNTER — Other Ambulatory Visit: Payer: Self-pay

## 2023-12-05 NOTE — Progress Notes (Signed)
 Clinical Intervention Note  Clinical Intervention Notes: Patient reported starting on Arnica, Bromelain, and a probiotic.  No DDIs were identified with his Jakafi .   Clinical Intervention Outcomes: Prevention of an adverse drug event   [name] [name]

## 2023-12-11 DIAGNOSIS — C44311 Basal cell carcinoma of skin of nose: Secondary | ICD-10-CM | POA: Diagnosis not present

## 2023-12-11 DIAGNOSIS — Z483 Aftercare following surgery for neoplasm: Secondary | ICD-10-CM | POA: Diagnosis not present

## 2023-12-25 DIAGNOSIS — H538 Other visual disturbances: Secondary | ICD-10-CM | POA: Diagnosis not present

## 2023-12-25 DIAGNOSIS — C760 Malignant neoplasm of head, face and neck: Secondary | ICD-10-CM | POA: Diagnosis not present

## 2023-12-25 DIAGNOSIS — T8189XA Other complications of procedures, not elsewhere classified, initial encounter: Secondary | ICD-10-CM | POA: Diagnosis not present

## 2023-12-25 DIAGNOSIS — C44311 Basal cell carcinoma of skin of nose: Secondary | ICD-10-CM | POA: Diagnosis not present

## 2023-12-25 DIAGNOSIS — Y849 Medical procedure, unspecified as the cause of abnormal reaction of the patient, or of later complication, without mention of misadventure at the time of the procedure: Secondary | ICD-10-CM | POA: Diagnosis not present

## 2023-12-25 DIAGNOSIS — Z483 Aftercare following surgery for neoplasm: Secondary | ICD-10-CM | POA: Diagnosis not present

## 2023-12-27 ENCOUNTER — Other Ambulatory Visit: Payer: Self-pay

## 2023-12-27 NOTE — Progress Notes (Signed)
 Specialty Pharmacy Refill Coordination Note  [name] [name] is a 68 y.o. male contacted today regarding refills of specialty medication(s) Ruxolitinib  Phosphate (JAKAFI )   Patient requested (Patient-Rptd) Delivery   Delivery date: 01/07/24   Verified address: (Patient-Rptd) [address] [place] [address]   Medication will be filled on 01/06/24.    Patient reports 19 days of medication on hand as of 12/27/23. Will mail 01/06/24 when insurance will pay and closer to actual need by date. Notified patient via MyChart.

## 2023-12-30 ENCOUNTER — Other Ambulatory Visit: Payer: Self-pay

## 2024-01-05 ENCOUNTER — Other Ambulatory Visit: Payer: Self-pay | Admitting: Cardiology

## 2024-01-05 DIAGNOSIS — I480 Paroxysmal atrial fibrillation: Secondary | ICD-10-CM

## 2024-01-06 NOTE — Progress Notes (Unsigned)
 Ellouise Console, PA-C [address] [place], [place]  [address] Phone: [phone]   Gastroenterology Consultation  Referring Provider:     [name], MD Primary Care Physician:  [name], MD Primary Gastroenterologist:  Ellouise Console, PA-C / Norleen Kiang, MD  Reason for Consultation:     Discussed repeat colonoscopy, GERD symptoms        HPI:   [name] [name] is a 68 y.o. y/o male referred for consultation & management  by [name], MD.    08/2013 last colonoscopy by Dr. Kiang: No polyps.  Good prep.  Moderate diverticulosis.  Otherwise normal.  10-year repeat (was due 08/2023).  Current symptoms: Patient has history of irritable bowel syndrome and acid reflux for many years.  He has episodes of nocturnal acid reflux and sour belches in the morning.  Takes OTC omeprazole 20 Mg daily which helps.  He is afraid to take omeprazole long-term.  Also takes Tums as needed for breakthrough reflux.  Denies dysphagia.  No previous EGD.  He has occasional lower abdominal cramping with bowel irregularities.  Episodes of diarrhea alternating with constipation.  Takes OTC MiraLAX and fiber supplement as needed with benefit.  Has rare episode of mild bright red blood on the tissue attributed to hemorrhoids a few times per year.  No recent rectal bleeding.  He denies family history of colon, esophageal, or stomach cancer.  Denies unintentional weight loss.  PMH: Atrial fibrillation, Hx CVA, on Eliquis , HTN, sleep apnea, CAD, polycythemia, IDA, hyperlipidemia, constipation, IBS, perirectal fistula in 2013, hemorrhoids.  He underwent AFib ablation 04/2022.  Echocardiogram 09/2021 showed LVEF 55 to 60%.  Cardiologist Dr. Pietro.  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   Diverticulosis of colon (without mention of hemorrhage)    Dysrhythmia    A fib   Heart murmur    slight  no issues    Hemorrhoids    History of kidney stones    Hypertension    IBS (irritable bowel syndrome)    Iron deficiency anemia due to chronic blood loss 09/26/2017   Irregular heartbeat    Neuromuscular disorder (HCC)    neuropathy legs and feet   Other and unspecified hyperlipidemia    Perirectal fistula    Polycythemia, secondary    Sleep apnea    mild cpap uses dental appliance   Stroke (HCC)    mild speech glich at times no other issues   Stye    blocker duct right eye lid    Past Surgical History:  Procedure Laterality Date   ATRIAL FIBRILLATION ABLATION N/A 04/13/2022   Procedure: ATRIAL FIBRILLATION ABLATION;  Surgeon: Inocencio Soyla Lunger, MD;  Location: [facility];  Service: Cardiovascular;  Laterality: N/A;   CARPAL TUNNEL RELEASE Left 09/2022   RETINAL DETACHMENT SURGERY  2005   REVERSE SHOULDER ARTHROPLASTY Right 07/18/2023   Procedure: ARTHROPLASTY, SHOULDER, TOTAL, REVERSE;  Surgeon: Dozier Soulier, MD;  Location: [facility];  Service: Orthopedics;  Laterality: Right;   ROTATOR CUFF REPAIR  1995   rt.   VASECTOMY     1991-or 1992    Prior to Admission medications   Medication Sig Start Date End Date Taking? Authorizing Provider  acetaminophen  (TYLENOL ) 500 MG tablet Take 1,000 mg by mouth every 6 (six) hours as needed for mild pain.     [provider]  amitriptyline  (ELAVIL ) 25 MG tablet Take 2 tablets (50 mg total) by mouth at bedtime. 07/02/23   Whitfield Raisin, NP  apixaban  (ELIQUIS ) 5 MG TABS tablet Take 1 tablet (5 mg total) by mouth 2 (two) times daily. 07/11/23 07/11/24  Camnitz, Soyla Lunger, MD  aspirin  EC 81 MG EC tablet Take 1 tablet (81 mg total) by mouth daily. 09/16/18   [name], MD  diltiazem  (CARDIZEM  CD) 120 MG 24 hr capsule Take 1 capsule (120 mg total) by mouth daily. 01/16/23   Pietro Redell RAMAN, MD  diltiazem  (CARDIZEM ) 30 MG tablet Take 1 tablet every 4 hours AS NEEDED for AFIB heart rate >100 as long as top BP >100. 06/27/22   Fenton,  Clint R, PA  fluocinonide (LIDEX) 0.05 % external solution Apply 1 Application topically. 07/19/23   [provider]  fluorouracil (EFUDEX) 5 % cream Apply 1 application  topically 2 (two) times daily as needed (dry skin). 10/28/20   [provider]  gabapentin  (NEURONTIN ) 300 MG capsule Take  2 capsules (600mg  )  by mouth in morning , 2 capsules (600 mg) in afternoon and 1 capsule (300mg )  at bedtime 08/28/23   Whitfield Raisin, NP  GEMTESA 75 MG TABS Take 1 tablet by mouth daily. 10/04/22   [provider]  losartan  (COZAAR ) 100 MG tablet Take 1 tablet (100 mg total) by mouth daily. 01/16/23   Pietro Redell RAMAN, MD  Melatonin 10 MG TABS Take 10 mg by mouth at bedtime.    [provider]  Multiple Vitamin (MULTIVITAMIN WITH MINERALS) TABS tablet Take 1 tablet by mouth in the morning.    [provider]  propranolol  ER (INDERAL  LA) 160 MG SR capsule TAKE 1 CAPSULE BY MOUTH DAILY 11/18/23   Fenton, Clint R, PA  rosuvastatin  (CRESTOR ) 20 MG tablet Take 1 tablet (20 mg total) by mouth daily. 01/16/23   Pietro Redell RAMAN, MD  ruxolitinib  phosphate (JAKAFI ) 10 MG tablet TAKE 1 TABLET BY MOUTH 2 TIMES DAILY. 10/08/23 10/07/24  Timmy Maude SAUNDERS, MD  tadalafil (CIALIS) 5 MG tablet Take 5 mg by mouth every other day. In the morning. 05/16/21   [provider]    Family History  Problem Relation Age of Onset   Aortic aneurysm Father    Heart attack Brother    Cerebral palsy Brother    Colon cancer Neg Hx    Esophageal cancer Neg Hx    Pancreatic cancer Neg Hx    Prostate cancer Neg Hx    Rectal cancer Neg Hx    Stomach cancer Neg Hx      Social History   Tobacco Use   Smoking status: Never   Smokeless tobacco: Never   Tobacco comments:    Never smoked 06/03/23  Vaping Use   Vaping status: Never Used  Substance Use Topics   Alcohol use: Yes    Alcohol/week: 2.0 standard drinks of alcohol    Types: 1 Cans of beer, 1 Shots of liquor per week     Comment: 3-4 mixed drinks or beer 10/25/23   Drug use: No    Allergies as of 01/07/2024 - Review Complete 01/07/2024  Allergen Reaction Noted   Trazodone hcl Other (See Comments) 08/10/2009   Mirtazapine Other (See Comments) 08/10/2009   Niacin Itching and Other (See Comments) 08/10/2009    Review of Systems:    All systems reviewed and negative except where noted in HPI.   Physical Exam:  BP  100/68   Pulse (!) 57   Ht 5' 11 (1.803 m)   Wt 221 lb 9.6 oz (100.5 kg)   BMI 30.91 kg/m  No LMP for male patient.  General:   Alert,  Well-developed, well-nourished, pleasant and cooperative in NAD Lungs:  Respirations even and unlabored.  Clear throughout to auscultation.   No wheezes, crackles, or rhonchi. No acute distress. Heart:  Regular rate and rhythm; no murmurs, clicks, rubs, or gallops. Abdomen:  Normal bowel sounds.  No bruits.  Soft, and non-distended without masses, hepatosplenomegaly or hernias noted.  No Tenderness.  No guarding or rebound tenderness.    Neurologic:  Alert and oriented x3;  grossly normal neurologically. Psych:  Alert and cooperative. Normal mood and affect.  Imaging Studies: No results found.  Labs: CBC    Component Value Date/Time   WBC 9.8 10/10/2023 0840   WBC 7.5 07/11/2023 1400   RBC 4.27 10/10/2023 0840   HGB 11.8 (L) 10/10/2023 0840   HGB 13.4 04/25/2017 1506   HGB 15.2 11/06/2007 1542   HCT 35.6 (L) 10/10/2023 0840   HCT 39.8 04/25/2017 1506   HCT 43.9 11/06/2007 1542   PLT 364 10/10/2023 0840   PLT 651 (H) 04/25/2017 1506   PLT 507 (H) 11/06/2007 1542   MCV 83.4 10/10/2023 0840   MCV 91 04/25/2017 1506   MCV 95.3 11/06/2007 1542    CMP     Component Value Date/Time   NA 140 10/10/2023 0840   NA 144 04/25/2017 1506   NA 139 04/12/2016 1113   K 4.3 10/10/2023 0840   K 4.2 04/25/2017 1506   K 4.1 04/12/2016 1113   CL 105 10/10/2023 0840   CL 105 04/25/2017 1506   CO2 29 10/10/2023 0840   CO2 29 04/25/2017 1506   CO2 22  04/12/2016 1113   GLUCOSE 97 10/10/2023 0840   GLUCOSE 95 04/25/2017 1506   BUN 22 10/10/2023 0840   BUN 17 04/25/2017 1506   BUN 16.3 04/12/2016 1113   CREATININE 1.27 (H) 10/10/2023 0840   CREATININE 1.2 04/25/2017 1506   CREATININE 0.9 04/12/2016 1113   CALCIUM  9.2 10/10/2023 0840   CALCIUM  9.5 04/25/2017 1506   CALCIUM  9.1 04/12/2016 1113   PROT 6.6 10/10/2023 0840   PROT 6.4 04/25/2017 1506   PROT 7.0 04/12/2016 1113   ALBUMIN 4.8 10/10/2023 0840   ALBUMIN 3.8 04/25/2017 1506   ALBUMIN 4.6 09/12/2016 1457   ALBUMIN 4.2 04/12/2016 1113   AST 26 10/10/2023 0840   AST 31 04/12/2016 1113   ALT 20 10/10/2023 0840   ALT 58 (H) 04/25/2017 1506   ALT 36 04/12/2016 1113   ALKPHOS 57 10/10/2023 0840   ALKPHOS 73 04/25/2017 1506   ALKPHOS 66 04/12/2016 1113   BILITOT 0.9 10/10/2023 0840   BILITOT 0.73 04/12/2016 1113   GFRNONAA >60 10/10/2023 0840   GFRAA >60 12/24/2019 0944    Assessment and Plan:   [name] [name] is a 68 y.o. y/o male has been referred for:   Chronic GERD requiring daily PPI - Scheduling EGD to screen for Barretts I discussed risks of EGD with patient to include risk of bleeding, perforation, and risk of sedation.  Patient expressed understanding and agrees to proceed with EGD.  - Continue omeprazole 20 Mg once daily.  Gave reassurance regarding low-dose PPI safety. - Recommend Lifestyle Modifications to prevent Acid Reflux.  Rec. Avoid coffee, sodas, peppermint, garlic, onions, alcohol, citrus fruits, chocolate, tomatoes, fatty and spicey foods.  Avoid  eating 2-3 hours before bedtime.    IBS-M (constipation alternating with diarrhea) - Continue Miralax / Metamucil as needed.  3.  Colon cancer screening: Overdue for 10-year repeat - Scheduling Colonoscopy I discussed risks of colonoscopy with patient to include risk of bleeding, colon perforation, and risk of sedation.  Patient expressed understanding and agrees to proceed with colonoscopy.   4.   Comorbidities:  Atrial fibrillation, Hx CVA, on Eliquis , HTN, sleep apnea, CAD, polycythemia, IDA, hyperlipidemia, constipation, IBS, perirectal fistula in 2013, hemorrhoids.  He underwent atrial fibrillation ablation 04/2022.  Echocardiogram 09/2021 showed LVEF 55 to 60%.  Cardiologist Dr. Pietro. - Requesting cardiac permission to hold Eliquis  2 days before colonoscopy and EGD.  Follow up as Needed based on procedure results and GI symptoms.  Ellouise Console, PA-C

## 2024-01-07 ENCOUNTER — Other Ambulatory Visit (HOSPITAL_COMMUNITY): Payer: Self-pay

## 2024-01-07 ENCOUNTER — Encounter: Payer: Self-pay | Admitting: Family Medicine

## 2024-01-07 ENCOUNTER — Encounter: Payer: Self-pay | Admitting: Physician Assistant

## 2024-01-07 ENCOUNTER — Other Ambulatory Visit: Payer: Self-pay

## 2024-01-07 ENCOUNTER — Ambulatory Visit: Admitting: Physician Assistant

## 2024-01-07 ENCOUNTER — Telehealth: Payer: Self-pay

## 2024-01-07 VITALS — BP 100/68 | HR 57 | Ht 71.0 in | Wt 221.6 lb

## 2024-01-07 DIAGNOSIS — Z1211 Encounter for screening for malignant neoplasm of colon: Secondary | ICD-10-CM

## 2024-01-07 DIAGNOSIS — K582 Mixed irritable bowel syndrome: Secondary | ICD-10-CM | POA: Diagnosis not present

## 2024-01-07 DIAGNOSIS — Z7901 Long term (current) use of anticoagulants: Secondary | ICD-10-CM

## 2024-01-07 DIAGNOSIS — K219 Gastro-esophageal reflux disease without esophagitis: Secondary | ICD-10-CM

## 2024-01-07 DIAGNOSIS — I6930 Unspecified sequelae of cerebral infarction: Secondary | ICD-10-CM | POA: Insufficient documentation

## 2024-01-07 MED ORDER — ROSUVASTATIN CALCIUM 20 MG PO TABS
20.0000 mg | ORAL_TABLET | Freq: Every day | ORAL | 0 refills | Status: DC
  Filled 2024-01-07: qty 30, 30d supply, fill #0

## 2024-01-07 MED ORDER — LOSARTAN POTASSIUM 100 MG PO TABS
100.0000 mg | ORAL_TABLET | Freq: Every day | ORAL | 0 refills | Status: DC
  Filled 2024-01-07: qty 30, 30d supply, fill #0

## 2024-01-07 MED ORDER — APIXABAN 5 MG PO TABS
5.0000 mg | ORAL_TABLET | Freq: Two times a day (BID) | ORAL | 1 refills | Status: AC
Start: 2024-01-07 — End: 2025-01-07
  Filled 2024-01-07: qty 180, 90d supply, fill #0
  Filled 2024-04-03: qty 180, 90d supply, fill #1

## 2024-01-07 MED ORDER — DILTIAZEM HCL ER COATED BEADS 120 MG PO CP24
120.0000 mg | ORAL_CAPSULE | Freq: Every day | ORAL | 0 refills | Status: DC
Start: 2024-01-07 — End: 2024-01-20
  Filled 2024-01-07: qty 30, 30d supply, fill #0

## 2024-01-07 MED ORDER — NA SULFATE-K SULFATE-MG SULF 17.5-3.13-1.6 GM/177ML PO SOLN: 1.0000 | Freq: Once | ORAL | 0 refills | Status: AC

## 2024-01-07 NOTE — Telephone Encounter (Signed)
 Patient with diagnosis of A Fib on Eliquis  for anticoagulation.    Procedure: Colonoscopy and Endoscopy  Date of procedure: 02/04/24   CHA2DS2-VASc Score = 5  This indicates a 7.2% annual risk of stroke. The patient's score is based upon: CHF History: 0 HTN History: 1 Diabetes History: 0 Stroke History: 2 Vascular Disease History: 1 Age Score: 1 Gender Score: 0    CrCl 81 m/min Platelet count 364K  Per office protocol, patient can hold Eliquis  for 2 days prior to procedure.    **This guidance is not considered finalized until pre-operative APP has relayed final recommendations.**

## 2024-01-07 NOTE — Patient Instructions (Signed)
 You have been scheduled for an Endoscopy and Colonoscopy. Please follow the written instructions given to you at your visit today.  If you use inhalers (even only as needed), please bring them with you on the day of your procedure.  DO NOT TAKE 7 DAYS PRIOR TO TEST- Trulicity (dulaglutide) Ozempic, Wegovy (semaglutide) Mounjaro (tirzepatide) Bydureon Bcise (exanatide extended release)  DO NOT TAKE 1 DAY PRIOR TO YOUR TEST Rybelsus (semaglutide) Adlyxin (lixisenatide) Victoza (liraglutide) Byetta (exanatide) ___________________________________________________________________________  Please follow up sooner if symptoms increase or worsen __________________________________________________________________________  Due to recent changes in healthcare laws, you may see the results of your imaging and laboratory studies on MyChart before your provider has had a chance to review them.  We understand that in some cases there may be results that are confusing or concerning to you. Not all laboratory results come back in the same time frame and the provider may be waiting for multiple results in order to interpret others.  Please give us  48 hours in order for your provider to thoroughly review all the results before contacting the office for clarification of your results.   Thank you for trusting me with your gastrointestinal care!   Ellouise Console, PA-C _______________________________________________________  If your blood pressure at your visit was 140/90 or greater, please contact your primary care physician to follow up on this.  _______________________________________________________  If you are age 21 or older, your body mass index should be between 23-30. Your Body mass index is 30.91 kg/m. If this is out of the aforementioned range listed, please consider follow up with your Primary Care Provider.  If you are age 33 or younger, your body mass index should be between 19-25. Your Body  mass index is 30.91 kg/m. If this is out of the aformentioned range listed, please consider follow up with your Primary Care Provider.   ________________________________________________________  The [facility] providers would like to encourage you to use MYCHART to communicate with providers for non-urgent requests or questions.  Due to long hold times on the telephone, sending your provider a message by [facility] may be a faster and more efficient way to get a response.  Please allow 48 business hours for a response.  Please remember that this is for non-urgent requests.  _______________________________________________________

## 2024-01-07 NOTE — Telephone Encounter (Signed)
  [facility] Pre-operative Risk Assessment     Request for surgical clearance:     Endoscopy Procedure  What type of surgery is being performed?     Colonoscopy and Endoscopy   When is this surgery scheduled?     02/04/24  What type of clearance is required ?   Pharmacy  Are there any medications that need to be held prior to surgery and how long? Eliquis  2 days   Practice name and name of physician performing surgery?      [facility]  What is your office phone and fax number?      Phone- [phone]  Fax- [phone]  Anesthesia type (None, local, MAC, general) ?       MAC   Please route your response to Alethea Blocker, CMA

## 2024-01-07 NOTE — Telephone Encounter (Signed)
 Prescription refill request for Eliquis  received. Indication: AF Last office visit: 6/25 Scr: 1.27 Age: 68 Weight:101.1

## 2024-01-07 NOTE — Progress Notes (Signed)
 Noted

## 2024-01-09 ENCOUNTER — Other Ambulatory Visit (HOSPITAL_COMMUNITY): Payer: Self-pay

## 2024-01-13 NOTE — Progress Notes (Signed)
 HPI: FU PAF. Stress echocardiogram June 2011 normal. Monitor February 2020 revealed sinus bradycardia, NSR, sinus tach, pacs, PVC, brief PAT and PAF. Admitted with CVA May 2020. 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 Aspirin  added to apixaban  by neurology as CVA occurred on apixaban . Patient was seen in follow-up by Dr. Timmy and it was felt that polycythemia may be contributing to his recent CVA.  Renal Dopplers May 2021 showed no renal artery stenosis.  Calcium  score 5/21 18 which was 71st percentile.  Abdominal ultrasound April 2023 showed no aneurysm.  Echocardiogram May 2023 showed normal LV function, grade 1 diastolic dysfunction, mild right ventricular enlargement and mildly dilated ascending aorta at 39 mm.  Cardiac MRI August 2023 with poor patient tolerance and study was truncated; ejection fraction 54% with normal wall thickness; study felt to be low likelihood for cardiac amyloid; mildly dilated ascending aorta at 40 mm.  Readmitted with atrial fibrillation June 2023.  Patient converted to sinus rhythm spontaneously.  Cardiac CTA prior to atrial fibrillation ablation December 2023 showed PFO.  Atrial fibrillation ablation was performed April 13, 2022.  Since last seen patient denies dyspnea on exertion, orthopnea, PND, pedal edema, exertional chest pain or syncope.  He feels as though he has had several episodes of atrial fibrillation lasting approximately 5 minutes.  They resolve spontaneously.  Current Outpatient Medications  Medication Sig Dispense Refill   acetaminophen  (TYLENOL ) 500 MG tablet Take 1,000 mg by mouth every 6 (six) hours as needed for mild pain.      amitriptyline  (ELAVIL ) 25 MG tablet Take 2 tablets (50 mg total) by mouth at bedtime. 180 tablet 3   apixaban  (ELIQUIS ) 5 MG TABS tablet Take 1  tablet (5 mg total) by mouth 2 (two) times daily. 180 tablet 1   aspirin  EC 81 MG EC tablet Take 1 tablet (81 mg total) by mouth daily. 60 tablet 0   diltiazem  (CARDIZEM  CD) 120 MG 24 hr capsule Take 1 capsule (120 mg total) by mouth daily. 30 capsule 0   diltiazem  (CARDIZEM ) 30 MG tablet Take 1 tablet every 4 hours AS NEEDED for AFIB heart rate >100 as long as top BP >100. (Patient taking differently: Take 30 mg by mouth as directed. Take 1 tablet every 4 hours AS NEEDED for AFIB heart rate >100 as long as top BP >100.) 30 tablet 1   fluocinonide (LIDEX) 0.05 % external solution Apply 1 Application topically.     fluorouracil (EFUDEX) 5 % cream Apply 1 application  topically 2 (two) times daily as needed (dry skin).     gabapentin  (NEURONTIN ) 300 MG capsule Take  2 capsules (600mg  )  by mouth in morning , 2 capsules (600 mg) in afternoon and 1 capsule (300mg )  at bedtime 450 capsule 3   GEMTESA 75 MG TABS Take 1 tablet by mouth daily.     Homeopathic Products (T-RELIEF ARNICA + 12 PO) Take by mouth daily at 6 (six) AM.     losartan  (COZAAR ) 100 MG tablet Take 1 tablet (100 mg total) by mouth daily. 30 tablet 0   Melatonin 10 MG TABS Take 10 mg by mouth at bedtime.     Multiple Vitamin (MULTIVITAMIN WITH MINERALS) TABS tablet Take 1 tablet by mouth in the morning.     NON FORMULARY daily at 6 (six) AM. Bromiline  Probiotic Product (PROBIOTIC PO) Take 1 capsule by mouth daily at 6 (six) AM.     propranolol  ER (INDERAL  LA) 160 MG SR capsule TAKE 1 CAPSULE BY MOUTH DAILY 30 capsule 3   rosuvastatin  (CRESTOR ) 20 MG tablet Take 1 tablet (20 mg total) by mouth daily. 30 tablet 0   ruxolitinib  phosphate (JAKAFI ) 10 MG tablet TAKE 1 TABLET BY MOUTH 2 TIMES DAILY. 60 tablet 4   tadalafil (CIALIS) 5 MG tablet Take 5 mg by mouth every other day. In the morning.     No current facility-administered medications for this visit.     Past Medical History:  Diagnosis Date   Arthritis    Cancer (HCC)     polycythemia  basal cell skin CA LEFT SIDE OF NECK   Diverticulosis of colon (without mention of hemorrhage)    Dysrhythmia    A fib   Heart murmur    slight  no issues   Hemorrhoids    History of kidney stones    Hypertension    IBS (irritable bowel syndrome)    Iron deficiency anemia due to chronic blood loss 09/26/2017   Irregular heartbeat    Neuromuscular disorder (HCC)    neuropathy legs and feet   Other and unspecified hyperlipidemia    Perirectal fistula    Polycythemia, secondary    Sleep apnea    mild cpap uses dental appliance   Stroke (HCC)    mild speech glich at times no other issues   Stye    blocker duct right eye lid    Past Surgical History:  Procedure Laterality Date   ATRIAL FIBRILLATION ABLATION N/A 04/13/2022   Procedure: ATRIAL FIBRILLATION ABLATION;  Surgeon: Inocencio Soyla Lunger, MD;  Location: [facility];  Service: Cardiovascular;  Laterality: N/A;   CARPAL TUNNEL RELEASE Left 09/2022   RETINAL DETACHMENT SURGERY  2005   REVERSE SHOULDER ARTHROPLASTY Right 07/18/2023   Procedure: ARTHROPLASTY, SHOULDER, TOTAL, REVERSE;  Surgeon: Dozier Soulier, MD;  Location: [facility];  Service: Orthopedics;  Laterality: Right;   ROTATOR CUFF REPAIR  1995   rt.   VASECTOMY     1991-or 1992    Social History   Socioeconomic History   Marital status: Married    Spouse name: Avelina   Number of children: 2   Years of education: Not on file   Highest education level: Not on file  Occupational History   Occupation: Garment/textile technologist: [employer]  Tobacco Use   Smoking status: Never   Smokeless tobacco: Never   Tobacco comments:    Never smoked 06/03/23  Vaping Use   Vaping status: Never Used  Substance and Sexual Activity   Alcohol use: Yes    Alcohol/week: 2.0 standard drinks of alcohol    Types: 1 Cans of beer, 1 Shots of liquor per week    Comment: 3-4 mixed drinks or beer 10/25/23   Drug use: No   Sexual activity: Yes  Other  Topics Concern   Not on file  Social History Narrative   Lives with wife   Social Drivers of Health   Financial Resource Strain: Not on file  Food Insecurity: Low Risk  (08/26/2023)   Received from [facility]   Hunger Vital Sign    Within the past 12 months, you worried that your food would run out before you got money to buy more: Never true    Within the past 12 months, the food you bought  just didn't last and you didn't have money to get more. : Never true  Transportation Needs: No Transportation Needs (08/26/2023)   Received from [facility]    In the past 12 months, has lack of reliable transportation kept you from medical appointments, meetings, work or from getting things needed for daily living? : No  Physical Activity: Not on file  Stress: Not on file  Social Connections: Not on file  Intimate Partner Violence: Not on file    Family History  Problem Relation Age of Onset   Aortic aneurysm Father    Heart attack Brother    Cerebral palsy Brother    Colon cancer Neg Hx    Esophageal cancer Neg Hx    Pancreatic cancer Neg Hx    Prostate cancer Neg Hx    Rectal cancer Neg Hx    Stomach cancer Neg Hx     ROS: no fevers or chills, productive cough, hemoptysis, dysphasia, odynophagia, melena, hematochezia, dysuria, hematuria, rash, seizure activity, orthopnea, PND, pedal edema, claudication. Remaining systems are negative.  Physical Exam: Well-developed well-nourished in no acute distress.  Skin is warm and dry.  HEENT is normal.  Neck is supple.  Chest is clear to auscultation with normal expansion.  Cardiovascular exam is regular rate and rhythm.  Abdominal exam nontender or distended. No masses palpated. Extremities show no edema. neuro grossly intact  A/P  1 paroxysmal atrial fibrillation-patient remains in sinus rhythm status post ablation.  Will continue Cardizem , propranolol  and apixaban .  If he has frequent, sustained episodes in the  future we will consider antiarrhythmic therapy versus referral for repeat ablation.  2 coronary calcification-patient is not having chest pain.  Continue statin.  3 hyperlipidemia-continue statin.  4 hypertension-patient's blood pressure is controlled.  Continue present medical regimen.  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  6 obstructive sleep apnea-patient uses a dental appliance.  Redell Shallow, MD

## 2024-01-16 ENCOUNTER — Inpatient Hospital Stay (HOSPITAL_BASED_OUTPATIENT_CLINIC_OR_DEPARTMENT_OTHER): Admitting: Hematology & Oncology

## 2024-01-16 ENCOUNTER — Encounter: Payer: Self-pay | Admitting: Hematology & Oncology

## 2024-01-16 ENCOUNTER — Inpatient Hospital Stay: Attending: Hematology & Oncology

## 2024-01-16 VITALS — BP 122/67 | HR 58 | Temp 98.8°F | Resp 17 | Wt 220.1 lb

## 2024-01-16 DIAGNOSIS — D751 Secondary polycythemia: Secondary | ICD-10-CM | POA: Diagnosis not present

## 2024-01-16 DIAGNOSIS — Z8673 Personal history of transient ischemic attack (TIA), and cerebral infarction without residual deficits: Secondary | ICD-10-CM | POA: Diagnosis not present

## 2024-01-16 DIAGNOSIS — Z7901 Long term (current) use of anticoagulants: Secondary | ICD-10-CM | POA: Diagnosis not present

## 2024-01-16 DIAGNOSIS — D45 Polycythemia vera: Secondary | ICD-10-CM | POA: Insufficient documentation

## 2024-01-16 DIAGNOSIS — Z7982 Long term (current) use of aspirin: Secondary | ICD-10-CM | POA: Insufficient documentation

## 2024-01-16 DIAGNOSIS — E611 Iron deficiency: Secondary | ICD-10-CM | POA: Insufficient documentation

## 2024-01-16 DIAGNOSIS — C44311 Basal cell carcinoma of skin of nose: Secondary | ICD-10-CM

## 2024-01-16 DIAGNOSIS — D500 Iron deficiency anemia secondary to blood loss (chronic): Secondary | ICD-10-CM | POA: Diagnosis not present

## 2024-01-16 LAB — CBC WITH DIFFERENTIAL (CANCER CENTER ONLY)
Abs Immature Granulocytes: 0.12 K/uL — ABNORMAL HIGH (ref 0.00–0.07)
Basophils Absolute: 0.1 K/uL (ref 0.0–0.1)
Basophils Relative: 2 %
Eosinophils Absolute: 0.3 K/uL (ref 0.0–0.5)
Eosinophils Relative: 4 %
HCT: 34.2 % — ABNORMAL LOW (ref 39.0–52.0)
Hemoglobin: 11.3 g/dL — ABNORMAL LOW (ref 13.0–17.0)
Immature Granulocytes: 2 %
Lymphocytes Relative: 13 %
Lymphs Abs: 0.9 K/uL (ref 0.7–4.0)
MCH: 28.5 pg (ref 26.0–34.0)
MCHC: 33 g/dL (ref 30.0–36.0)
MCV: 86.1 fL (ref 80.0–100.0)
Monocytes Absolute: 0.6 K/uL (ref 0.1–1.0)
Monocytes Relative: 8 %
Neutro Abs: 4.9 K/uL (ref 1.7–7.7)
Neutrophils Relative %: 71 %
Platelet Count: 285 K/uL (ref 150–400)
RBC: 3.97 MIL/uL — ABNORMAL LOW (ref 4.22–5.81)
RDW: 19.1 % — ABNORMAL HIGH (ref 11.5–15.5)
WBC Count: 7 K/uL (ref 4.0–10.5)
nRBC: 1.3 % — ABNORMAL HIGH (ref 0.0–0.2)

## 2024-01-16 LAB — CMP (CANCER CENTER ONLY)
ALT: 23 U/L (ref 0–44)
AST: 33 U/L (ref 15–41)
Albumin: 4.7 g/dL (ref 3.5–5.0)
Alkaline Phosphatase: 52 U/L (ref 38–126)
Anion gap: 10 (ref 5–15)
BUN: 18 mg/dL (ref 8–23)
CO2: 26 mmol/L (ref 22–32)
Calcium: 9.3 mg/dL (ref 8.9–10.3)
Chloride: 106 mmol/L (ref 98–111)
Creatinine: 1.27 mg/dL — ABNORMAL HIGH (ref 0.61–1.24)
GFR, Estimated: >60 mL/min (ref >60)
Glucose, Bld: 105 mg/dL — ABNORMAL HIGH (ref 70–99)
Potassium: 4.9 mmol/L (ref 3.5–5.1)
Sodium: 142 mmol/L (ref 135–145)
Total Bilirubin: 0.9 mg/dL (ref 0.0–1.2)
Total Protein: 6.5 g/dL (ref 6.5–8.1)

## 2024-01-16 LAB — LACTATE DEHYDROGENASE: LDH: 496 U/L — ABNORMAL HIGH (ref 98–192)

## 2024-01-16 LAB — SAVE SMEAR(SSMR), FOR PROVIDER SLIDE REVIEW

## 2024-01-16 NOTE — Progress Notes (Signed)
 Hematology and Oncology Follow Up Visit  Rico Massar [id number] 03-24-1956 68 y.o. 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 Geathers is here today for follow-up.  He is doing okay.  He actually had plastic surgery done.  He had a lesion on his nose.  He needed to have this removed.  He had a skin graft placed that was taken from his forehead.  He said that it took quite a while for this to heal up.  Looks quite good right now.  He and his family will be going to the beach next week.  I am sure that they are looking forward to this.  He has done grossly well from his right shoulder surgery.  He is out playing golf.  He has had no fever.  He has had no bleeding.  He has had no nausea or vomiting.  He has had no change in bowel or bladder habits.  He is doing quite well on the Jakafi .  He continues on Eliquis .  He does have some bruising from where he was working on his car.  Overall, I would have to say that his performance status is probably ECOG 1.   Medications:  Allergies as of 01/16/2024       Reactions   Trazodone Hcl Other (See Comments)   Other reaction(s): too sleepy in AM   Mirtazapine Other (See Comments)   Other reaction(s): too sleepy in the AM Other Reaction(s): Other (See Comments)    Other reaction(s): too sleepy in the AM   Niacin Itching, Other (See Comments)   Flushed feeling Other Reaction(s): Other (See Comments)    Flushed feeling        Medication List        Accurate as of January 16, 2024 10:11 AM. If you  have any questions, ask your nurse or doctor.          acetaminophen  500 MG tablet Commonly known as: TYLENOL  Take 1,000 mg by mouth every 6 (six) hours as needed for mild pain.   amitriptyline  25 MG tablet Commonly known as: ELAVIL  Take 2 tablets (50 mg total) by mouth at bedtime.   aspirin  EC 81 MG tablet Take 1 tablet (81 mg total) by mouth daily.   diltiazem  120 MG 24 hr capsule Commonly known as: CARDIZEM  CD Take 1 capsule (120 mg total) by mouth daily.   diltiazem  30 MG tablet Commonly known as: Cardizem  Take 1 tablet every 4 hours AS NEEDED for AFIB heart rate >100 as long as top BP >100. What changed:  how much to take how to take this when to take this   Eliquis  5 MG Tabs tablet Generic drug: apixaban  Take 1 tablet (5 mg total) by mouth 2 (two) times daily.   fluocinonide 0.05 % external solution Commonly known as: LIDEX Apply 1 Application topically.   fluorouracil 5 % cream Commonly known as: EFUDEX Apply 1 application  topically 2 (two) times daily as needed (dry skin).   gabapentin  300 MG capsule Commonly known as: NEURONTIN   Take  2 capsules (600mg  )  by mouth in morning , 2 capsules (600 mg) in afternoon and 1 capsule (300mg )  at bedtime   Gemtesa 75 MG Tabs Generic drug: Vibegron Take 1 tablet by mouth daily.   Jakafi  10 MG tablet Generic drug: ruxolitinib  phosphate TAKE 1 TABLET BY MOUTH 2 TIMES DAILY.   losartan  100 MG tablet Commonly known as: COZAAR  Take 1 tablet (100 mg total) by mouth daily.   Melatonin 10 MG Tabs Take 10 mg by mouth at bedtime.   multivitamin with minerals Tabs tablet Take 1 tablet by mouth in the morning.   NON FORMULARY daily at 6 (six) AM. Bromiline   PROBIOTIC PO Take 1 capsule by mouth daily at 6 (six) AM.   propranolol  ER 160 MG SR capsule Commonly known as: INDERAL  LA TAKE 1 CAPSULE BY MOUTH DAILY   rosuvastatin  20 MG tablet Commonly known as: CRESTOR  Take 1 tablet (20 mg total) by mouth  daily.   T-RELIEF ARNICA + 12 PO Take by mouth daily at 6 (six) AM.   tadalafil 5 MG tablet Commonly known as: CIALIS Take 5 mg by mouth every other day. In the morning.        Allergies:  Allergies  Allergen Reactions   Trazodone Hcl Other (See Comments)    Other reaction(s): too sleepy in AM   Mirtazapine Other (See Comments)    Other reaction(s): too sleepy in the AM  Other Reaction(s): Other (See Comments)    Other reaction(s): too sleepy in the AM   Niacin Itching and Other (See Comments)    Flushed feeling  Other Reaction(s): Other (See Comments)    Flushed feeling     Past Medical History, Surgical history, Social history, and Family History were reviewed and updated.  Review of Systems: Review of Systems  Constitutional: Negative.   HENT: Negative.    Eyes: Negative.   Respiratory: Negative.    Cardiovascular: Negative.   Gastrointestinal: Negative.   Genitourinary: Negative.   Musculoskeletal: Negative.   Skin:  Positive for rash.  Neurological: Negative.   Endo/Heme/Allergies: Negative.   Psychiatric/Behavioral: Negative.       Physical Exam:  weight is 220 lb 1.9 oz (99.8 kg). His oral temperature is 98.8 F (37.1 C). His blood pressure is 122/67 and his pulse is 58 (abnormal). His respiration is 17 and oxygen saturation is 96%.   Wt Readings from Last 3 Encounters:  01/16/24 220 lb 1.9 oz (99.8 kg)  01/07/24 221 lb 9.6 oz (100.5 kg)  11/12/23 222 lb 12.8 oz (101.1 kg)    Physical Exam Vitals reviewed.  HENT:     Head: Normocephalic and atraumatic.  Eyes:     Pupils: Pupils are equal, round, and reactive to light.  Cardiovascular:     Rate and Rhythm: Normal rate and regular rhythm.     Heart sounds: Normal heart sounds.  Pulmonary:     Effort: Pulmonary effort is normal.     Breath sounds: Normal breath sounds.  Abdominal:     General: Bowel sounds are normal.     Palpations: Abdomen is soft.     Comments: I cannot palpate his  spleen.  Musculoskeletal:        General: No tenderness or deformity. Normal range of motion.     Cervical back: Normal range of motion.     Comments: He is in a shoulder harness for the right shoulder.  This is immobilized right now.  Lymphadenopathy:  Cervical: No cervical adenopathy.  Skin:    General: Skin is warm and dry.     Findings: No erythema or rash.  Neurological:     Mental Status: He is alert and oriented to person, place, and time.  Psychiatric:        Behavior: Behavior normal.        Thought Content: Thought content normal.        Judgment: Judgment normal.      Lab Results  Component Value Date   WBC 7.0 01/16/2024   HGB 11.3 (L) 01/16/2024   HCT 34.2 (L) 01/16/2024   MCV 86.1 01/16/2024   PLT 285 01/16/2024   Lab Results  Component Value Date   FERRITIN 58 04/22/2023   IRON 141 04/22/2023   TIBC 392 04/22/2023   UIBC 251 04/22/2023   IRONPCTSAT 36 04/22/2023   Lab Results  Component Value Date   RETICCTPCT 2.9 04/22/2023   RBC 3.97 (L) 01/16/2024   RETICCTABS 87.6 02/21/2011   Lab Results  Component Value Date   KPAFRELGTCHN 10.7 09/27/2021   LAMBDASER 8.5 09/27/2021   KAPLAMBRATIO 1.26 09/27/2021   No results found for: IGGSERUM, IGA, IGMSERUM No results found for: STEPHANY CARLOTA BENSON MARKEL EARLA JOANNIE DOC, MSPIKE, SPEI   Chemistry      Component Value Date/Time   NA 142 01/16/2024 0924   NA 144 04/25/2017 1506   NA 139 04/12/2016 1113   K 4.9 01/16/2024 0924   K 4.2 04/25/2017 1506   K 4.1 04/12/2016 1113   CL 106 01/16/2024 0924   CL 105 04/25/2017 1506   CO2 26 01/16/2024 0924   CO2 29 04/25/2017 1506   CO2 22 04/12/2016 1113   BUN 18 01/16/2024 0924   BUN 17 04/25/2017 1506   BUN 16.3 04/12/2016 1113   CREATININE 1.27 (H) 01/16/2024 0924   CREATININE 1.2 04/25/2017 1506   CREATININE 0.9 04/12/2016 1113      Component Value Date/Time   CALCIUM  9.3 01/16/2024 0924   CALCIUM  9.5  04/25/2017 1506   CALCIUM  9.1 04/12/2016 1113   ALKPHOS 52 01/16/2024 0924   ALKPHOS 73 04/25/2017 1506   ALKPHOS 66 04/12/2016 1113   AST 33 01/16/2024 0924   AST 31 04/12/2016 1113   ALT 23 01/16/2024 0924   ALT 58 (H) 04/25/2017 1506   ALT 36 04/12/2016 1113   BILITOT 0.9 01/16/2024 0924   BILITOT 0.73 04/12/2016 1113       Impression and Plan: Mr. Scalise is a very pleasant 68 yo caucasian gentleman with polycythemia vera, JAK2 positive.  Everything looks quite good right now.  His platelet count is lower.  His splenic volume is lower.  He is doing well on the Jakafi .  Everything is doing quite nicely.  Not very happy for him.  We will plan to get him back in another 3 months I think.  Will get him back after Thanksgiving.  At that time, I will repeat a splenic ultrasound.   Maude JONELLE Crease, MD 9/11/202510:11 AM

## 2024-01-20 ENCOUNTER — Ambulatory Visit (INDEPENDENT_AMBULATORY_CARE_PROVIDER_SITE_OTHER): Admitting: Cardiology

## 2024-01-20 ENCOUNTER — Encounter: Payer: Self-pay | Admitting: Cardiology

## 2024-01-20 VITALS — BP 136/79 | HR 60 | Ht 71.0 in | Wt 215.0 lb

## 2024-01-20 DIAGNOSIS — E7800 Pure hypercholesterolemia, unspecified: Secondary | ICD-10-CM | POA: Diagnosis not present

## 2024-01-20 DIAGNOSIS — I480 Paroxysmal atrial fibrillation: Secondary | ICD-10-CM

## 2024-01-20 DIAGNOSIS — I10 Essential (primary) hypertension: Secondary | ICD-10-CM | POA: Diagnosis not present

## 2024-01-20 DIAGNOSIS — I2510 Atherosclerotic heart disease of native coronary artery without angina pectoris: Secondary | ICD-10-CM | POA: Diagnosis not present

## 2024-01-20 MED ORDER — ROSUVASTATIN CALCIUM 20 MG PO TABS: 20.0000 mg | ORAL_TABLET | Freq: Every day | ORAL | 3 refills | Status: AC

## 2024-01-20 MED ORDER — LOSARTAN POTASSIUM 100 MG PO TABS: 100.0000 mg | ORAL_TABLET | Freq: Every day | ORAL | 3 refills | Status: AC

## 2024-01-20 MED ORDER — DILTIAZEM HCL ER COATED BEADS 120 MG PO CP24: 120.0000 mg | ORAL_CAPSULE | Freq: Every day | ORAL | 3 refills | Status: AC

## 2024-01-20 NOTE — Patient Instructions (Signed)

## 2024-01-28 ENCOUNTER — Encounter: Payer: Self-pay | Admitting: Internal Medicine

## 2024-01-29 ENCOUNTER — Other Ambulatory Visit (HOSPITAL_COMMUNITY): Payer: Self-pay

## 2024-01-31 ENCOUNTER — Encounter: Payer: Self-pay | Admitting: Internal Medicine

## 2024-02-04 ENCOUNTER — Other Ambulatory Visit: Payer: Self-pay

## 2024-02-04 ENCOUNTER — Encounter: Payer: Self-pay | Admitting: Internal Medicine

## 2024-02-04 ENCOUNTER — Ambulatory Visit: Admitting: Internal Medicine

## 2024-02-04 VITALS — BP 110/62 | HR 51 | Temp 98.2°F | Resp 13 | Ht 71.0 in | Wt 221.0 lb

## 2024-02-04 DIAGNOSIS — K635 Polyp of colon: Secondary | ICD-10-CM

## 2024-02-04 DIAGNOSIS — K219 Gastro-esophageal reflux disease without esophagitis: Secondary | ICD-10-CM | POA: Diagnosis not present

## 2024-02-04 DIAGNOSIS — Z860101 Personal history of adenomatous and serrated colon polyps: Secondary | ICD-10-CM | POA: Diagnosis not present

## 2024-02-04 DIAGNOSIS — Z13810 Encounter for screening for upper gastrointestinal disorder: Secondary | ICD-10-CM | POA: Diagnosis not present

## 2024-02-04 DIAGNOSIS — Z1211 Encounter for screening for malignant neoplasm of colon: Secondary | ICD-10-CM

## 2024-02-04 DIAGNOSIS — K449 Diaphragmatic hernia without obstruction or gangrene: Secondary | ICD-10-CM

## 2024-02-04 DIAGNOSIS — D128 Benign neoplasm of rectum: Secondary | ICD-10-CM | POA: Diagnosis not present

## 2024-02-04 DIAGNOSIS — Z860100 Personal history of colon polyps, unspecified: Secondary | ICD-10-CM

## 2024-02-04 DIAGNOSIS — D122 Benign neoplasm of ascending colon: Secondary | ICD-10-CM

## 2024-02-04 DIAGNOSIS — K5730 Diverticulosis of large intestine without perforation or abscess without bleeding: Secondary | ICD-10-CM

## 2024-02-04 MED ORDER — SODIUM CHLORIDE 0.9 % IV SOLN: 500.0000 mL | Freq: Once | INTRAVENOUS | Status: DC

## 2024-02-04 NOTE — Op Note (Signed)
 [facility] Patient Name: [name] [name] Procedure Date: 02/04/2024 1:17 PM MRN: [id number] Endoscopist: Norleen SAILOR. Abran , MD, [id number] Age: 68 Referring MD:  Date of Birth: Nov 20, 1955 Gender: Male Account #: [id number] Procedure:                Upper GI endoscopy Indications:              Esophageal reflux, Screening for Barrett's esophagus Medicines:                Monitored Anesthesia Care Procedure:                Pre-Anesthesia Assessment:                           - Prior to the procedure, a History and Physical                            was performed, and patient medications and                            allergies were reviewed. The patient's tolerance of                            previous anesthesia was also reviewed. The risks                            and benefits of the procedure and the sedation                            options and risks were discussed with the patient.                            All questions were answered, and informed consent                            was obtained. Prior Anticoagulants: The patient has                            taken Eliquis  (apixaban ), last dose was 3 days                            prior to procedure. ASA Grade Assessment: III - A                            patient with severe systemic disease. After                            reviewing the risks and benefits, the patient was                            deemed in satisfactory condition to undergo the                            procedure.  After obtaining informed consent, the endoscope was                            passed under direct vision. Throughout the                            procedure, the patient's blood pressure, pulse, and                            oxygen saturations were monitored continuously. The                            Olympus scope [id number] was introduced through the                            mouth, and advanced to the  second part of duodenum.                            The upper GI endoscopy was accomplished without                            difficulty. The patient tolerated the procedure                            well. Scope In: Scope Out: Findings:                 The esophagus was normal. No Barrett's.                           The stomach was normal. Small hiatal hernia.                           The examined duodenum was normal.                           The cardia and gastric fundus were normal on                            retroflexion. Complications:            No immediate complications. Estimated Blood Loss:     Estimated blood loss: none. Impression:               1. Normal EGD                           2. No Barrett's                           3. GERD. Recommendation:           - Patient has a contact number available for                            emergencies. The signs and symptoms of potential  delayed complications were discussed with the                            patient. Return to normal activities tomorrow.                            Written discharge instructions were provided to the                            patient.                           - Resume previous diet.                           - Continue present medications.                           ?" Resume Eliquis  today                           ?" Return to the care of your primary provider Norleen SAILOR. Abran, MD 02/04/2024 2:25:15 PM This report has been signed electronically.

## 2024-02-04 NOTE — Progress Notes (Signed)
 Sedate, gd SR, tolerated procedure well, VSS, report to RN

## 2024-02-04 NOTE — Op Note (Signed)
 [facility] Patient Name: [name] [name] Procedure Date: 02/04/2024 1:18 PM MRN: [id number] Endoscopist: Norleen SAILOR. Abran , MD, [id number] Age: 68 Referring MD:  Date of Birth: 01/22/1956 Gender: Male Account #: [id number] Procedure:                Colonoscopy with cold snare polypectomy x 2 Indications:              High risk colon cancer surveillance: Personal                            history of non-advanced adenoma. Previous                            examinations 2010, 2015 Medicines:                Monitored Anesthesia Care Procedure:                Pre-Anesthesia Assessment:                           - Prior to the procedure, a History and Physical                            was performed, and patient medications and                            allergies were reviewed. The patient's tolerance of                            previous anesthesia was also reviewed. The risks                            and benefits of the procedure and the sedation                            options and risks were discussed with the patient.                            All questions were answered, and informed consent                            was obtained. Prior Anticoagulants: The patient has                            taken Eliquis  (apixaban ), last dose was 3 days                            prior to procedure. ASA Grade Assessment: III - A                            patient with severe systemic disease. After                            reviewing the risks and benefits, the patient was  deemed in satisfactory condition to undergo the                            procedure.                           After obtaining informed consent, the colonoscope                            was passed under direct vision. Throughout the                            procedure, the patient's blood pressure, pulse, and                            oxygen saturations were monitored  continuously. The                            Olympus Scope SN: [id number] was introduced through                            the anus and advanced to the the cecum, identified                            by appendiceal orifice and ileocecal valve. The                            terminal ileum, ileocecal valve, appendiceal                            orifice, and rectum were photographed. The quality                            of the bowel preparation was excellent. The                            colonoscopy was performed without difficulty. The                            patient tolerated the procedure well. The bowel                            preparation used was SUPREP via split dose                            instruction. Scope In: 1:32:38 PM Scope Out: 1:51:32 PM Scope Withdrawal Time: 0 hours 17 minutes 16 seconds  Total Procedure Duration: 0 hours 18 minutes 54 seconds  Findings:                 The terminal ileum appeared normal.                           Two polyps were found in the rectum and ascending  colon. The polyps were 3 to 4 mm in size. These                            polyps were removed with a cold snare. Resection                            and retrieval were complete.                           Many diverticula were found in the left colon.                           The exam was otherwise without abnormality on                            direct and retroflexion views. Complications:            No immediate complications. Estimated blood loss:                            None. Estimated Blood Loss:     Estimated blood loss: none. Impression:               - The examined portion of the ileum was normal.                           - Two 3 to 4 mm polyps in the rectum and in the                            ascending colon, removed with a cold snare.                            Resected and retrieved.                           - Diverticulosis in the left  colon.                           - The examination was otherwise normal on direct                            and retroflexion views. Recommendation:           - Repeat colonoscopy in 5 years for surveillance.                           - Patient has a contact number available for                            emergencies. The signs and symptoms of potential                            delayed complications were discussed with the  patient. Return to normal activities tomorrow.                            Written discharge instructions were provided to the                            patient.                           - Resume previous diet.                           - Continue present medications.                           - RESUME ELIQUIS  today                           - Await pathology results. Norleen SAILOR. Abran, MD 02/04/2024 1:58:15 PM This report has been signed electronically.

## 2024-02-04 NOTE — Progress Notes (Signed)
 Expand All Collapse All       Ellouise Console, PA-C [address] [place], [place]  [address] Phone: [phone]   Gastroenterology Consultation   Referring Provider:     [name], MD Primary Care Physician:  [name], MD Primary Gastroenterologist:  Ellouise Console, PA-C / Norleen Kiang, MD  Reason for Consultation:     Discussed repeat colonoscopy, GERD symptoms        HPI:   [name] [name] is a 68 y.o. y/o male referred for consultation & management  by [name], MD.     08/2013 last colonoscopy by Dr. Kiang: No polyps.  Good prep.  Moderate diverticulosis.  Otherwise normal.  10-year repeat (was due 08/2023).   Current symptoms: Patient has history of irritable bowel syndrome and acid reflux for many years.  He has episodes of nocturnal acid reflux and sour belches in the morning.  Takes OTC omeprazole 20 Mg daily which helps.  He is afraid to take omeprazole long-term.  Also takes Tums as needed for breakthrough reflux.  Denies dysphagia.  No previous EGD.  He has occasional lower abdominal cramping with bowel irregularities.  Episodes of diarrhea alternating with constipation.  Takes OTC MiraLAX and fiber supplement as needed with benefit.  Has rare episode of mild bright red blood on the tissue attributed to hemorrhoids a few times per year.  No recent rectal bleeding.  He denies family history of colon, esophageal, or stomach cancer.  Denies unintentional weight loss.   PMH: Atrial fibrillation, Hx CVA, on Eliquis , HTN, sleep apnea, CAD, polycythemia, IDA, hyperlipidemia, constipation, IBS, perirectal fistula in 2013, hemorrhoids.  He underwent AFib ablation 04/2022.  Echocardiogram 09/2021 showed LVEF 55 to 60%.  Cardiologist Dr. Pietro.       Past Medical History:  Diagnosis Date   Arthritis     Cancer (HCC)      polycythemia  basal cell skin CA LEFT SIDE OF NECK   Diverticulosis of colon (without mention of hemorrhage)     Dysrhythmia      A fib    Heart murmur      slight  no issues   Hemorrhoids     History of kidney stones     Hypertension     IBS (irritable bowel syndrome)     Iron deficiency anemia due to chronic blood loss 09/26/2017   Irregular heartbeat     Neuromuscular disorder (HCC)      neuropathy legs and feet   Other and unspecified hyperlipidemia     Perirectal fistula     Polycythemia, secondary     Sleep apnea      mild cpap uses dental appliance   Stroke (HCC)      mild speech glich at times no other issues   Stye      blocker duct right eye lid               Past Surgical History:  Procedure Laterality Date   ATRIAL FIBRILLATION ABLATION N/A 04/13/2022    Procedure: ATRIAL FIBRILLATION ABLATION;  Surgeon: Inocencio Soyla Lunger, MD;  Location: [facility];  Service: Cardiovascular;  Laterality: N/A;   CARPAL TUNNEL RELEASE Left 09/2022   RETINAL DETACHMENT SURGERY   2005   REVERSE SHOULDER ARTHROPLASTY Right 07/18/2023    Procedure: ARTHROPLASTY, SHOULDER, TOTAL, REVERSE;  Surgeon: Dozier Soulier, MD;  Location: [facility];  Service: Orthopedics;  Laterality: Right;   ROTATOR CUFF REPAIR   1995  rt.   VASECTOMY        1991-or 1992                 Prior to Admission medications   Medication Sig Start Date End Date Taking? Authorizing Provider  acetaminophen  (TYLENOL ) 500 MG tablet Take 1,000 mg by mouth every 6 (six) hours as needed for mild pain.        [provider]  amitriptyline  (ELAVIL ) 25 MG tablet Take 2 tablets (50 mg total) by mouth at bedtime. 07/02/23     Whitfield Raisin, NP  apixaban  (ELIQUIS ) 5 MG TABS tablet Take 1 tablet (5 mg total) by mouth 2 (two) times daily. 07/11/23 07/11/24   Inocencio Soyla Lunger, MD  aspirin  EC 81 MG EC tablet Take 1 tablet (81 mg total) by mouth daily. 09/16/18     [name], MD  diltiazem  (CARDIZEM  CD) 120 MG 24 hr capsule Take 1 capsule (120 mg total) by mouth daily. 01/16/23     Pietro Redell RAMAN, MD  diltiazem  (CARDIZEM ) 30 MG tablet Take  1 tablet every 4 hours AS NEEDED for AFIB heart rate >100 as long as top BP >100. 06/27/22     Fenton, Clint R, PA  fluocinonide (LIDEX) 0.05 % external solution Apply 1 Application topically. 07/19/23     [provider]  fluorouracil (EFUDEX) 5 % cream Apply 1 application  topically 2 (two) times daily as needed (dry skin). 10/28/20     [provider]  gabapentin  (NEURONTIN ) 300 MG capsule Take  2 capsules (600mg  )  by mouth in morning , 2 capsules (600 mg) in afternoon and 1 capsule (300mg )  at bedtime 08/28/23     Whitfield Raisin, NP  GEMTESA 75 MG TABS Take 1 tablet by mouth daily. 10/04/22     [provider]  losartan  (COZAAR ) 100 MG tablet Take 1 tablet (100 mg total) by mouth daily. 01/16/23     Pietro Redell RAMAN, MD  Melatonin 10 MG TABS Take 10 mg by mouth at bedtime.       [provider]  Multiple Vitamin (MULTIVITAMIN WITH MINERALS) TABS tablet Take 1 tablet by mouth in the morning.       [provider]  propranolol  ER (INDERAL  LA) 160 MG SR capsule TAKE 1 CAPSULE BY MOUTH DAILY 11/18/23     Fenton, Clint R, PA  rosuvastatin  (CRESTOR ) 20 MG tablet Take 1 tablet (20 mg total) by mouth daily. 01/16/23     Pietro Redell RAMAN, MD  ruxolitinib  phosphate (JAKAFI ) 10 MG tablet TAKE 1 TABLET BY MOUTH 2 TIMES DAILY. 10/08/23 10/07/24   Timmy Maude SAUNDERS, MD  tadalafil (CIALIS) 5 MG tablet Take 5 mg by mouth every other day. In the morning. 05/16/21     [provider]           Family History  Problem Relation Age of Onset   Aortic aneurysm Father     Heart attack Brother     Cerebral palsy Brother     Colon cancer Neg Hx     Esophageal cancer Neg Hx     Pancreatic cancer Neg Hx     Prostate cancer Neg Hx     Rectal cancer Neg Hx     Stomach cancer Neg Hx            Social History  Social History         Tobacco Use   Smoking status: Never  Smokeless tobacco: Never   Tobacco comments:      Never smoked 06/03/23  Vaping Use   Vaping  status: Never Used  Substance Use Topics   Alcohol use: Yes      Alcohol/week: 2.0 standard drinks of alcohol      Types: 1 Cans of beer, 1 Shots of liquor per week      Comment: 3-4 mixed drinks or beer 10/25/23   Drug use: No             Allergies as of 01/07/2024 - Review Complete 01/07/2024  Allergen Reaction Noted   Trazodone hcl Other (See Comments) 08/10/2009   Mirtazapine Other (See Comments) 08/10/2009   Niacin Itching and Other (See Comments) 08/10/2009      Review of Systems:    All systems reviewed and negative except where noted in HPI.    Physical Exam:  BP 100/68   Pulse (!) 57   Ht 5' 11 (1.803 m)   Wt 221 lb 9.6 oz (100.5 kg)   BMI 30.91 kg/m  No LMP for male patient.   General:   Alert,  Well-developed, well-nourished, pleasant and cooperative in NAD Lungs:  Respirations even and unlabored.  Clear throughout to auscultation.   No wheezes, crackles, or rhonchi. No acute distress. Heart:  Regular rate and rhythm; no murmurs, clicks, rubs, or gallops. Abdomen:  Normal bowel sounds.  No bruits.  Soft, and non-distended without masses, hepatosplenomegaly or hernias noted.  No Tenderness.  No guarding or rebound tenderness.    Neurologic:  Alert and oriented x3;  grossly normal neurologically. Psych:  Alert and cooperative. Normal mood and affect.   Imaging Studies: Imaging Results  No results found.     Labs: CBC Labs (Brief)          Component Value Date/Time    WBC 9.8 10/10/2023 0840    WBC 7.5 07/11/2023 1400    RBC 4.27 10/10/2023 0840    HGB 11.8 (L) 10/10/2023 0840    HGB 13.4 04/25/2017 1506    HGB 15.2 11/06/2007 1542    HCT 35.6 (L) 10/10/2023 0840    HCT 39.8 04/25/2017 1506    HCT 43.9 11/06/2007 1542    PLT 364 10/10/2023 0840    PLT 651 (H) 04/25/2017 1506    PLT 507 (H) 11/06/2007 1542    MCV 83.4 10/10/2023 0840    MCV 91 04/25/2017 1506    MCV 95.3 11/06/2007 1542        CMP     Labs (Brief)          Component Value  Date/Time    NA 140 10/10/2023 0840    NA 144 04/25/2017 1506    NA 139 04/12/2016 1113    K 4.3 10/10/2023 0840    K 4.2 04/25/2017 1506    K 4.1 04/12/2016 1113    CL 105 10/10/2023 0840    CL 105 04/25/2017 1506    CO2 29 10/10/2023 0840    CO2 29 04/25/2017 1506    CO2 22 04/12/2016 1113    GLUCOSE 97 10/10/2023 0840    GLUCOSE 95 04/25/2017 1506    BUN 22 10/10/2023 0840    BUN 17 04/25/2017 1506    BUN 16.3 04/12/2016 1113    CREATININE 1.27 (H) 10/10/2023 0840    CREATININE 1.2 04/25/2017 1506    CREATININE 0.9 04/12/2016 1113    CALCIUM  9.2 10/10/2023 0840    CALCIUM  9.5 04/25/2017 1506  CALCIUM  9.1 04/12/2016 1113    PROT 6.6 10/10/2023 0840    PROT 6.4 04/25/2017 1506    PROT 7.0 04/12/2016 1113    ALBUMIN 4.8 10/10/2023 0840    ALBUMIN 3.8 04/25/2017 1506    ALBUMIN 4.6 09/12/2016 1457    ALBUMIN 4.2 04/12/2016 1113    AST 26 10/10/2023 0840    AST 31 04/12/2016 1113    ALT 20 10/10/2023 0840    ALT 58 (H) 04/25/2017 1506    ALT 36 04/12/2016 1113    ALKPHOS 57 10/10/2023 0840    ALKPHOS 73 04/25/2017 1506    ALKPHOS 66 04/12/2016 1113    BILITOT 0.9 10/10/2023 0840    BILITOT 0.73 04/12/2016 1113    GFRNONAA >60 10/10/2023 0840    GFRAA >60 12/24/2019 0944        Assessment and Plan:    Wilfredo Canterbury is a 68 y.o. y/o male has been referred for:    Chronic GERD requiring daily PPI - Scheduling EGD to screen for Barretts I discussed risks of EGD with patient to include risk of bleeding, perforation, and risk of sedation.  Patient expressed understanding and agrees to proceed with EGD.  - Continue omeprazole 20 Mg once daily.  Gave reassurance regarding low-dose PPI safety. - Recommend Lifestyle Modifications to prevent Acid Reflux.  Rec. Avoid coffee, sodas, peppermint, garlic, onions, alcohol, citrus fruits, chocolate, tomatoes, fatty and spicey foods.  Avoid eating 2-3 hours before bedtime.     IBS-M (constipation alternating with diarrhea) -  Continue Miralax / Metamucil as needed.   3.  Colon cancer screening: Overdue for 10-year repeat - Scheduling Colonoscopy I discussed risks of colonoscopy with patient to include risk of bleeding, colon perforation, and risk of sedation.  Patient expressed understanding and agrees to proceed with colonoscopy.    4.  Comorbidities:  Atrial fibrillation, Hx CVA, on Eliquis , HTN, sleep apnea, CAD, polycythemia, IDA, hyperlipidemia, constipation, IBS, perirectal fistula in 2013, hemorrhoids.  He underwent atrial fibrillation ablation 04/2022.  Echocardiogram 09/2021 showed LVEF 55 to 60%.  Cardiologist Dr. Pietro. - Requesting cardiac permission to hold Eliquis  2 days before colonoscopy and EGD.   Follow up as Needed based on procedure results and GI symptoms.   Ellouise Console, PA-C

## 2024-02-04 NOTE — Progress Notes (Signed)
 Called to room to assist during endoscopic procedure.  Patient ID and intended procedure confirmed with present staff. Received instructions for my participation in the procedure from the performing physician.

## 2024-02-04 NOTE — Patient Instructions (Addendum)
 Resume previous diet Continue present medications, resume Eliquis  TODAY AT USUAL DOSE Await pathology results Repeat screening colonoscopy in 5 years  Handouts/information given for polyps, diverticulosis   YOU HAD AN ENDOSCOPIC PROCEDURE TODAY AT THE [facility]:   Refer to the procedure report that was given to you for any specific questions about what was found during the examination.  If the procedure report does not answer your questions, please call your gastroenterologist to clarify.  If you requested that your care partner not be given the details of your procedure findings, then the procedure report has been included in a sealed envelope for you to review at your convenience later.  YOU SHOULD EXPECT: Some feelings of bloating in the abdomen. Passage of more gas than usual.  Walking can help get rid of the air that was put into your GI tract during the procedure and reduce the bloating. If you had a lower endoscopy (such as a colonoscopy or flexible sigmoidoscopy) you may notice spotting of blood in your stool or on the toilet paper. If you underwent a bowel prep for your procedure, you may not have a normal bowel movement for a few days.  Please Note:  You might notice some irritation and congestion in your nose or some drainage.  This is from the oxygen used during your procedure.  There is no need for concern and it should clear up in a day or so.  SYMPTOMS TO REPORT IMMEDIATELY:  Following lower endoscopy (colonoscopy):  Excessive amounts of blood in the stool  Significant tenderness or worsening of abdominal pains  Swelling of the abdomen that is new, acute  Fever of 100F or higher Following upper endoscopy (EGD)  Vomiting of blood or coffee ground material  New chest pain or pain under the shoulder blades  Painful or persistently difficult swallowing  New shortness of breath  Black, tarry-looking stools For urgent or emergent issues, a gastroenterologist can be  reached at any hour by calling [phone]. Do not use MyChart messaging for urgent concerns.   DIET:  We do recommend a small meal at first, but then you may proceed to your regular diet.  Drink plenty of fluids but you should avoid alcoholic beverages for 24 hours.  ACTIVITY:  You should plan to take it easy for the rest of today and you should NOT DRIVE or use heavy machinery until tomorrow (because of the sedation medicines used during the test).    FOLLOW UP: Our staff will call the number listed on your records the next business day following your procedure.  We will call around 7:15- 8:00 am to check on you and address any questions or concerns that you may have regarding the information given to you following your procedure. If we do not reach you, we will leave a message.     If any biopsies were taken you will be contacted by phone or by letter within the next 1-3 weeks.  Please call us  at [phone] if you have not heard about the biopsies in 3 weeks.   SIGNATURES/CONFIDENTIALITY: You and/or your care partner have signed paperwork which will be entered into your electronic medical record.  These signatures attest to the fact that that the information above on your After Visit Summary has been reviewed and is understood.  Full responsibility of the confidentiality of this discharge information lies with you and/or your care-partner.

## 2024-02-05 ENCOUNTER — Telehealth: Payer: Self-pay | Admitting: *Deleted

## 2024-02-05 ENCOUNTER — Other Ambulatory Visit: Payer: Self-pay

## 2024-02-05 NOTE — Progress Notes (Signed)
 Specialty Pharmacy Refill Coordination Note  [name] [name] is a 68 y.o. male contacted today regarding refills of specialty medication(s) Ruxolitinib  Phosphate (JAKAFI )   Patient requested Delivery   Delivery date: 02/11/24   Verified address: [address] [facility] [place] [address]   Medication will be filled on 10.06.25.

## 2024-02-05 NOTE — Telephone Encounter (Signed)
 No answer on follow up call. Left message.

## 2024-02-07 LAB — SURGICAL PATHOLOGY

## 2024-02-10 ENCOUNTER — Ambulatory Visit: Payer: Self-pay | Admitting: Internal Medicine

## 2024-03-04 ENCOUNTER — Other Ambulatory Visit: Payer: Self-pay

## 2024-03-04 ENCOUNTER — Other Ambulatory Visit: Payer: Self-pay | Admitting: Hematology & Oncology

## 2024-03-04 DIAGNOSIS — D751 Secondary polycythemia: Secondary | ICD-10-CM

## 2024-03-04 MED ORDER — RUXOLITINIB PHOSPHATE 10 MG PO TABS
ORAL_TABLET | Freq: Two times a day (BID) | ORAL | 4 refills | Status: AC
  Filled 2024-03-04: qty 60, fill #0
  Filled 2024-03-05: qty 60, 30d supply, fill #0
  Filled 2024-04-01 – 2024-04-06 (×3): qty 60, 30d supply, fill #1
  Filled 2024-04-29 – 2024-05-04 (×2): qty 60, 30d supply, fill #2
  Filled 2024-06-04 – 2024-06-05 (×2): qty 60, 30d supply, fill #3

## 2024-03-05 ENCOUNTER — Other Ambulatory Visit: Payer: Self-pay

## 2024-03-09 ENCOUNTER — Other Ambulatory Visit: Payer: Self-pay

## 2024-03-09 ENCOUNTER — Other Ambulatory Visit (HOSPITAL_COMMUNITY): Payer: Self-pay

## 2024-03-09 NOTE — Progress Notes (Signed)
 Specialty Pharmacy Refill Coordination Note  [name] [name] is a 68 y.o. male contacted today regarding refills of specialty medication(s) Ruxolitinib  Phosphate (JAKAFI )   Patient requested Delivery   Delivery date: 03/11/24   Verified address: [address] [facility] [place] [address]   Medication will be filled on: 03/10/24

## 2024-04-01 ENCOUNTER — Other Ambulatory Visit: Payer: Self-pay

## 2024-04-03 ENCOUNTER — Other Ambulatory Visit (HOSPITAL_COMMUNITY): Payer: Self-pay

## 2024-04-03 ENCOUNTER — Other Ambulatory Visit: Payer: Self-pay

## 2024-04-06 ENCOUNTER — Other Ambulatory Visit (HOSPITAL_COMMUNITY): Payer: Self-pay

## 2024-04-06 ENCOUNTER — Other Ambulatory Visit: Payer: Self-pay | Admitting: Pharmacy Technician

## 2024-04-06 ENCOUNTER — Other Ambulatory Visit: Payer: Self-pay

## 2024-04-06 NOTE — Progress Notes (Signed)
 Specialty Pharmacy Refill Coordination Note  [name] [name] is a 68 y.o. male contacted today regarding refills of specialty medication(s)  Ruxolitinib  Phosphate (JAKAFI )   Patient requested (Patient-Rptd) Delivery   Delivery date: 04/09/2024 Verified address: (Patient-Rptd) [address]  [place]  [address]   Medication will be filled on: 04/08/2024

## 2024-04-09 ENCOUNTER — Other Ambulatory Visit: Payer: Self-pay

## 2024-04-09 NOTE — Progress Notes (Signed)
 Specialty Pharmacy Ongoing Clinical Assessment Note  [name] [name] is a 68 y.o. 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   Therapeutic benefit summary: Patient is achieving benefit   Adverse events/side effects summary: No adverse events/side effects   Patient's therapy is appropriate to: Continue    Goals Addressed             This Visit's Progress    Stabilization of disease   On track    Patient is on track. Patient will maintain adherence.  Dr. Timmy notated at his office visit on 01/16/24 that he is doing well at this time and his platelet count and splenic volume were both decreased.          Follow up: 6 months  [facility]

## 2024-04-15 ENCOUNTER — Inpatient Hospital Stay: Attending: Hematology & Oncology

## 2024-04-15 ENCOUNTER — Inpatient Hospital Stay: Admitting: Hematology & Oncology

## 2024-04-15 ENCOUNTER — Other Ambulatory Visit: Payer: Self-pay

## 2024-04-15 ENCOUNTER — Ambulatory Visit: Payer: Self-pay | Admitting: Hematology & Oncology

## 2024-04-15 ENCOUNTER — Ambulatory Visit (HOSPITAL_BASED_OUTPATIENT_CLINIC_OR_DEPARTMENT_OTHER)
Admission: RE | Admit: 2024-04-15 | Discharge: 2024-04-15 | Disposition: A | Discharge status: Home / Self Care | Source: Ambulatory Visit | Attending: Hematology & Oncology | Admitting: Hematology & Oncology

## 2024-04-15 VITALS — BP 117/69 | HR 56 | Temp 98.6°F | Resp 18 | Ht 71.0 in | Wt 219.0 lb

## 2024-04-15 DIAGNOSIS — D500 Iron deficiency anemia secondary to blood loss (chronic): Secondary | ICD-10-CM | POA: Diagnosis not present

## 2024-04-15 DIAGNOSIS — D751 Secondary polycythemia: Secondary | ICD-10-CM

## 2024-04-15 DIAGNOSIS — D45 Polycythemia vera: Secondary | ICD-10-CM | POA: Diagnosis present

## 2024-04-15 DIAGNOSIS — R161 Splenomegaly, not elsewhere classified: Secondary | ICD-10-CM | POA: Diagnosis not present

## 2024-04-15 DIAGNOSIS — E611 Iron deficiency: Secondary | ICD-10-CM | POA: Insufficient documentation

## 2024-04-15 DIAGNOSIS — C44311 Basal cell carcinoma of skin of nose: Secondary | ICD-10-CM

## 2024-04-15 DIAGNOSIS — Z8673 Personal history of transient ischemic attack (TIA), and cerebral infarction without residual deficits: Secondary | ICD-10-CM | POA: Diagnosis not present

## 2024-04-15 DIAGNOSIS — Z7901 Long term (current) use of anticoagulants: Secondary | ICD-10-CM | POA: Insufficient documentation

## 2024-04-15 LAB — CBC WITH DIFFERENTIAL (CANCER CENTER ONLY)
Abs Immature Granulocytes: 0.08 K/uL — ABNORMAL HIGH (ref 0.00–0.07)
Basophils Absolute: 0.1 K/uL (ref 0.0–0.1)
Basophils Relative: 1 %
Eosinophils Absolute: 0.4 K/uL (ref 0.0–0.5)
Eosinophils Relative: 6 %
HCT: 36.2 % — ABNORMAL LOW (ref 39.0–52.0)
Hemoglobin: 11.9 g/dL — ABNORMAL LOW (ref 13.0–17.0)
Immature Granulocytes: 1 %
Lymphocytes Relative: 14 %
Lymphs Abs: 1 K/uL (ref 0.7–4.0)
MCH: 28.7 pg (ref 26.0–34.0)
MCHC: 32.9 g/dL (ref 30.0–36.0)
MCV: 87.4 fL (ref 80.0–100.0)
Monocytes Absolute: 0.7 K/uL (ref 0.1–1.0)
Monocytes Relative: 9 %
Neutro Abs: 5.4 K/uL (ref 1.7–7.7)
Neutrophils Relative %: 69 %
Platelet Count: 321 K/uL (ref 150–400)
RBC: 4.14 MIL/uL — ABNORMAL LOW (ref 4.22–5.81)
RDW: 18.8 % — ABNORMAL HIGH (ref 11.5–15.5)
WBC Count: 7.7 K/uL (ref 4.0–10.5)
nRBC: 0.9 % — ABNORMAL HIGH (ref 0.0–0.2)

## 2024-04-15 LAB — IRON AND IRON BINDING CAPACITY (CC-WL,HP ONLY)
Iron: 134 ug/dL (ref 45–182)
Saturation Ratios: 33 % (ref 17.9–39.5)
TIBC: 409 ug/dL (ref 250–450)
UIBC: 275 ug/dL

## 2024-04-15 LAB — CMP (CANCER CENTER ONLY)
ALT: 29 U/L (ref 0–44)
AST: 35 U/L (ref 15–41)
Albumin: 4.8 g/dL (ref 3.5–5.0)
Alkaline Phosphatase: 53 U/L (ref 38–126)
Anion gap: 10 (ref 5–15)
BUN: 20 mg/dL (ref 8–23)
CO2: 27 mmol/L (ref 22–32)
Calcium: 9.4 mg/dL (ref 8.9–10.3)
Chloride: 106 mmol/L (ref 98–111)
Creatinine: 1.09 mg/dL (ref 0.61–1.24)
GFR, Estimated: >60 mL/min (ref >60)
Glucose, Bld: 96 mg/dL (ref 70–99)
Potassium: 4 mmol/L (ref 3.5–5.1)
Sodium: 143 mmol/L (ref 135–145)
Total Bilirubin: 0.9 mg/dL (ref 0.0–1.2)
Total Protein: 6.6 g/dL (ref 6.5–8.1)

## 2024-04-15 LAB — SAVE SMEAR(SSMR), FOR PROVIDER SLIDE REVIEW

## 2024-04-15 LAB — LACTATE DEHYDROGENASE: LDH: 483 U/L — ABNORMAL HIGH (ref 105–235)

## 2024-04-15 LAB — FERRITIN: Ferritin: 78 ng/mL (ref 24–336)

## 2024-04-15 NOTE — Progress Notes (Signed)
 Hematology and Oncology Follow Up Visit  [name] [name] [id number] 11-Dec-1955 67 y.o. 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  We actually last saw him back on 01/16/2024.  Since then, he has been doing pretty well.  He had a nice Thanksgiving.  His family will be coming in from out of town for Christmas.  He did have a ultrasound of the spleen done today.  This showed that his splenic size was holding steady.  I think the splenic size was 681 cm.  The prior ultrasound that was done back in June showed a splenic size of 761 cm.  He has had no chest pain.  He has had no problems with cerebrovascular events.  He continues on Eliquis  and baby aspirin ..  Continues on Jakafi .  He has managed the Jakafi  very nicely.  He has had no bleeding.  He has had no change in bowel or bladder habits.  He has had no fever.  He has had no leg swelling.  Overall, I will say that his performance status is probably ECOG 0.   Medications:  Allergies as of 04/15/2024       Reactions   Trazodone Hcl Other (See Comments)   Other reaction(s): too sleepy in AM   Mirtazapine Other (See Comments)   Other reaction(s): too sleepy in the AM Other Reaction(s): Other (See Comments)    Other reaction(s): too sleepy in the AM   Niacin Itching, Other (See Comments)   Flushed feeling Other Reaction(s): Other (See Comments)    Flushed feeling        Medication List        Accurate as of April 15, 2024 11:01 AM. If you have any  questions, ask your nurse or doctor.          acetaminophen  500 MG tablet Commonly known as: TYLENOL  Take 1,000 mg by mouth every 6 (six) hours as needed for mild pain.   amitriptyline  25 MG tablet Commonly known as: ELAVIL  Take 2 tablets (50 mg total) by mouth at bedtime.   aspirin  EC 81 MG tablet Take 1 tablet (81 mg total) by mouth daily.   cephALEXin 500 MG capsule Commonly known as: KEFLEX Take 500 mg by mouth 3 (three) times daily.   diltiazem  120 MG 24 hr capsule Commonly known as: CARDIZEM  CD Take 1 capsule (120 mg total) by mouth daily.   diltiazem  120 MG 24 hr capsule Commonly known as: TIAZAC  Take 120 mg by mouth daily.   diltiazem  30 MG tablet Commonly known as: Cardizem  Take 1 tablet every 4 hours AS NEEDED for AFIB heart rate >100 as long as top BP >100.   Eliquis  5 MG Tabs tablet Generic drug: apixaban  Take 1 tablet (5 mg total) by mouth 2 (two) times daily.   fluocinonide 0.05 % external solution Commonly known as: LIDEX Apply 1 Application topically.   fluorouracil 5 % cream Commonly known as: EFUDEX Apply 1 application  topically 2 (two) times daily as needed (dry skin).  gabapentin  300 MG capsule Commonly known as: NEURONTIN  Take  2 capsules (600mg  )  by mouth in morning , 2 capsules (600 mg) in afternoon and 1 capsule (300mg )  at bedtime   Gemtesa 75 MG Tabs Generic drug: Vibegron Take 1 tablet by mouth daily.   Jakafi  10 MG tablet Generic drug: ruxolitinib  phosphate TAKE 1 TABLET BY MOUTH 2 TIMES DAILY.   losartan  100 MG tablet Commonly known as: COZAAR  Take 1 tablet (100 mg total) by mouth daily.   Melatonin 10 MG Tabs Take 10 mg by mouth at bedtime.   multivitamin with minerals Tabs tablet Take 1 tablet by mouth in the morning.   PROBIOTIC PO Take 1 capsule by mouth daily at 6 (six) AM.   propranolol  ER 160 MG SR capsule Commonly known as: INDERAL  LA TAKE 1 CAPSULE BY MOUTH DAILY   rosuvastatin  20 MG tablet Commonly  known as: CRESTOR  Take 1 tablet (20 mg total) by mouth daily.   tadalafil 5 MG tablet Commonly known as: CIALIS Take 5 mg by mouth every other day. In the morning.   traMADol  50 MG tablet Commonly known as: ULTRAM  Take 50 mg by mouth every 8 (eight) hours as needed.        Allergies:  Allergies  Allergen Reactions   Trazodone Hcl Other (See Comments)    Other reaction(s): too sleepy in AM   Mirtazapine Other (See Comments)    Other reaction(s): too sleepy in the AM  Other Reaction(s): Other (See Comments)    Other reaction(s): too sleepy in the AM   Niacin Itching and Other (See Comments)    Flushed feeling  Other Reaction(s): Other (See Comments)    Flushed feeling     Past Medical History, Surgical history, Social history, and Family History were reviewed and updated.  Review of Systems: Review of Systems  Constitutional: Negative.   HENT: Negative.    Eyes: Negative.   Respiratory: Negative.    Cardiovascular: Negative.   Gastrointestinal: Negative.   Genitourinary: Negative.   Musculoskeletal: Negative.   Skin:  Positive for rash.  Neurological: Negative.   Endo/Heme/Allergies: Negative.   Psychiatric/Behavioral: Negative.       Physical Exam:  Vital signs show temperature of 98.6.  Pulse 56.  Blood pressure 117/69.  Weight is 219 pounds.  Wt Readings from Last 3 Encounters:  02/04/24 221 lb (100.2 kg)  01/20/24 215 lb (97.5 kg)  01/16/24 220 lb 1.9 oz (99.8 kg)    Physical Exam Vitals reviewed.  HENT:     Head: Normocephalic and atraumatic.  Eyes:     Pupils: Pupils are equal, round, and reactive to light.  Cardiovascular:     Rate and Rhythm: Normal rate and regular rhythm.     Heart sounds: Normal heart sounds.  Pulmonary:     Effort: Pulmonary effort is normal.     Breath sounds: Normal breath sounds.  Abdominal:     General: Bowel sounds are normal.     Palpations: Abdomen is soft.     Comments: I cannot palpate his spleen.   Musculoskeletal:        General: No tenderness or deformity. Normal range of motion.     Cervical back: Normal range of motion.     Comments:    Lymphadenopathy:     Cervical: No cervical adenopathy.  Skin:    General: Skin is warm and dry.     Findings: No erythema or rash.  Neurological:     Mental  Status: He is alert and oriented to person, place, and time.  Psychiatric:        Behavior: Behavior normal.        Thought Content: Thought content normal.        Judgment: Judgment normal.      Lab Results  Component Value Date   WBC 7.7 04/15/2024   HGB 11.9 (L) 04/15/2024   HCT 36.2 (L) 04/15/2024   MCV 87.4 04/15/2024   PLT 321 04/15/2024   Lab Results  Component Value Date   FERRITIN 58 04/22/2023   IRON 141 04/22/2023   TIBC 392 04/22/2023   UIBC 251 04/22/2023   IRONPCTSAT 36 04/22/2023   Lab Results  Component Value Date   RETICCTPCT 2.9 04/22/2023   RBC 4.14 (L) 04/15/2024   RETICCTABS 87.6 02/21/2011   Lab Results  Component Value Date   KPAFRELGTCHN 10.7 09/27/2021   LAMBDASER 8.5 09/27/2021   KAPLAMBRATIO 1.26 09/27/2021   No results found for: IGGSERUM, IGA, IGMSERUM No results found for: STEPHANY CARLOTA BENSON MARKEL EARLA JOANNIE DOC, MSPIKE, SPEI   Chemistry      Component Value Date/Time   NA 143 04/15/2024 0944   NA 144 04/25/2017 1506   NA 139 04/12/2016 1113   K 4.0 04/15/2024 0944   K 4.2 04/25/2017 1506   K 4.1 04/12/2016 1113   CL 106 04/15/2024 0944   CL 105 04/25/2017 1506   CO2 27 04/15/2024 0944   CO2 29 04/25/2017 1506   CO2 22 04/12/2016 1113   BUN 20 04/15/2024 0944   BUN 17 04/25/2017 1506   BUN 16.3 04/12/2016 1113   CREATININE 1.09 04/15/2024 0944   CREATININE 1.2 04/25/2017 1506   CREATININE 0.9 04/12/2016 1113      Component Value Date/Time   CALCIUM  9.4 04/15/2024 0944   CALCIUM  9.5 04/25/2017 1506   CALCIUM  9.1 04/12/2016 1113   ALKPHOS 53 04/15/2024 0944   ALKPHOS 73  04/25/2017 1506   ALKPHOS 66 04/12/2016 1113   AST 35 04/15/2024 0944   AST 31 04/12/2016 1113   ALT 29 04/15/2024 0944   ALT 58 (H) 04/25/2017 1506   ALT 36 04/12/2016 1113   BILITOT 0.9 04/15/2024 0944   BILITOT 0.73 04/12/2016 1113       Impression and Plan: Mr. Vanatta is a very pleasant 68 yo caucasian gentleman with polycythemia vera, JAK2 positive.    Everything looks quite good right now.  So far, his disease really has been fairly quiescent.  His blood counts been holding pretty steady.  We have not had to phlebotomize him for quite a while.  I am just happy that his quality of life is doing so well.  I know that he will enjoy the Christmas holiday with his family coming into visit.  We will plan to get him back in 3 more months.  We will get him through the Winter.  I do not think we need another ultrasound probably until the Summer.    Maude JONELLE Crease, MD 12/10/202511:01 AM

## 2024-04-22 ENCOUNTER — Ambulatory Visit (HOSPITAL_COMMUNITY)
Admission: RE | Admit: 2024-04-22 | Discharge: 2024-04-22 | Attending: Physician Assistant | Admitting: Physician Assistant

## 2024-04-22 VITALS — BP 104/66 | HR 58 | Ht 71.0 in

## 2024-04-22 DIAGNOSIS — I4891 Unspecified atrial fibrillation: Secondary | ICD-10-CM | POA: Diagnosis not present

## 2024-04-22 DIAGNOSIS — D6869 Other thrombophilia: Secondary | ICD-10-CM

## 2024-04-22 DIAGNOSIS — I480 Paroxysmal atrial fibrillation: Secondary | ICD-10-CM | POA: Diagnosis not present

## 2024-04-22 NOTE — Progress Notes (Signed)
 Primary Care Physician: [name] Toribio MATSU, MD Primary Cardiologist: Dr Pietro  Primary Electrophysiologist: Dr Inocencio Referring Physician: Dr Inocencio Anes [name] [name] is a 68 y.o. male with a history of HTN, HLD, CVA, OSA, CAD, HLD, atrial fibrillation who presents for follow up in the [facility]. He presented to the hospital 10/17/2021 with rapid atrial fibrillation. He had sudden onset of brief cramping in his left upper chest. He developed palpitations and started feeling poorly. He converted to sinus rhythm while in the hospital without need for intervention. Patient is on Eliquis  for stroke prevention. He was seen by Dr Inocencio and underwent afib ablation 04/13/22.  Patient returns for follow up for atrial fibrillation. He remains in SR today and feels well. He has rare palpitations that last only a few seconds, no sustained symptoms. He denies any significant bleeding but does bruise easily.   Today, he  denies symptoms of palpitations, chest pain, shortness of breath, orthopnea, PND, lower extremity edema, dizziness, presyncope, syncope, bleeding, or neurologic sequela. The patient is tolerating medications without difficulties and is otherwise without complaint today.    Atrial Fibrillation Risk Factors:  he does have symptoms or diagnosis of sleep apnea. he does not have a history of rheumatic fever.   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   Diverticulosis of colon (without mention of hemorrhage)    Dysrhythmia    A fib   Heart murmur    slight  no issues   Hemorrhoids    History of kidney stones    Hypertension    IBS (irritable bowel syndrome)    Iron deficiency anemia due to chronic  blood loss 09/26/2017   Irregular heartbeat    Neuromuscular disorder (HCC)    neuropathy legs and feet   Other and unspecified hyperlipidemia    Perirectal fistula    Polycythemia, secondary    Sleep apnea    mild cpap uses dental appliance   Stroke (HCC)    mild speech glich at times no other issues   Stye    blocker duct right eye lid    Current Outpatient Medications  Medication Sig Dispense Refill   acetaminophen  (TYLENOL ) 500 MG tablet Take 1,000 mg by mouth every 6 (six) hours as needed for mild pain.  (Patient taking differently: 500mg -650mg  as needed)     amitriptyline  (ELAVIL ) 25 MG tablet Take 2 tablets (50 mg total) by mouth at bedtime. 180 tablet 3   apixaban  (ELIQUIS ) 5 MG TABS tablet Take 1 tablet (5 mg total) by mouth 2 (two) times daily. 180 tablet 1   aspirin  EC 81 MG EC tablet Take 1 tablet (81 mg total) by mouth daily. 60 tablet 0   diltiazem  (CARDIZEM  CD) 120 MG 24 hr capsule Take 1 capsule (120 mg total) by mouth daily. 90 capsule 3   diltiazem  (CARDIZEM ) 30 MG tablet Take 1 tablet every 4 hours AS NEEDED for AFIB heart rate >100 as long as top BP >100. 30 tablet 1   fluocinonide (LIDEX) 0.05 % external solution Apply 1 Application topically.     fluorouracil (EFUDEX) 5 % cream Apply 1 application  topically 2 (two) times daily as needed (dry skin).     gabapentin  (NEURONTIN ) 300 MG  capsule Take  2 capsules (600mg  )  by mouth in morning , 2 capsules (600 mg) in afternoon and 1 capsule (300mg )  at bedtime (Patient taking differently: 300 mg in the morning, 300 mg in the afternoon and 300 mg at bedtime) 450 capsule 3   GEMTESA 75 MG TABS Take 1 tablet by mouth daily.     losartan  (COZAAR ) 100 MG tablet Take 1 tablet (100 mg total) by mouth daily. 90 tablet 3   Melatonin 10 MG TABS Take 10 mg by mouth at bedtime.     Multiple Vitamin (MULTIVITAMIN WITH MINERALS) TABS tablet Take 1 tablet by mouth in the morning.     Probiotic Product (PROBIOTIC PO) Take 1 capsule by  mouth daily at 6 (six) AM. (Patient taking differently: Take 1 capsule by mouth as needed.)     propranolol  ER (INDERAL  LA) 160 MG SR capsule TAKE 1 CAPSULE BY MOUTH DAILY 30 capsule 3   rosuvastatin  (CRESTOR ) 20 MG tablet Take 1 tablet (20 mg total) by mouth daily. 90 tablet 3   ruxolitinib  phosphate (JAKAFI ) 10 MG tablet TAKE 1 TABLET BY MOUTH 2 TIMES DAILY. 60 tablet 4   tadalafil (CIALIS) 5 MG tablet Take 5 mg by mouth every other day. In the morning.     No current facility-administered medications for this encounter.    ROS- All systems are reviewed and negative except as per the HPI above.  Physical Exam: Vitals:   04/22/24 0834  BP: 104/66  Pulse: (!) 58  Height: 5' 11 (1.803 m)     GEN: Well nourished, well developed in no acute distress CARDIAC: Regular rate and rhythm, no murmurs, rubs, gallops RESPIRATORY:  Clear to auscultation without rales, wheezing or rhonchi  ABDOMEN: Soft, non-tender, non-distended EXTREMITIES:  No edema; No deformity    Wt Readings from Last 3 Encounters:  04/15/24 99.3 kg  02/04/24 100.2 kg  01/20/24 97.5 kg    EKG Interpretation Date/Time:  Wednesday April 22 2024 08:40:02 EST Ventricular Rate:  58 PR Interval:  204 QRS Duration:  116 QT Interval:  436 QTC Calculation: 428 R Axis:   -39  Text Interpretation: Sinus bradycardia with 1st degree A-V block Left axis deviation Left ventricular hypertrophy with QRS widening ( R in aVL , Cornell product ) Abnormal ECG When compared with ECG of 25-Oct-2023 13:04, No significant change was found Confirmed by [name] [name] ([id number]) on 04/22/2024 9:00:50 AM    Echo 09/07/21 demonstrated   1. Left ventricular ejection fraction, by estimation, is 55 to 60%. Left  ventricular ejection fraction by 3D volume is 58 %. The left ventricle has  normal function. The left ventricle has no regional wall motion  abnormalities. Left ventricular diastolic parameters are consistent with Grade I diastolic  dysfunction (impaired relaxation). The average left ventricular global longitudinal strain is -21.3 %. The global longitudinal strain is normal.   2. Right ventricular systolic function is normal. The right ventricular  size is mildly enlarged. Tricuspid regurgitation signal is inadequate for  assessing PA pressure.   3. The mitral valve is normal in structure. Trivial mitral valve  regurgitation. No evidence of mitral stenosis.   4. The aortic valve is normal in structure. Aortic valve regurgitation is  not visualized. No aortic stenosis is present.   5. Aortic dilatation noted. There is mild dilatation of the ascending  aorta, measuring 39 mm.   Comparison(s): 09/13/18 EF 55-60%.   Epic records are reviewed at length today   CHA2DS2-VASc Score =  5  The patient's score is based upon: CHF History: 0 HTN History: 1 Diabetes History: 0 Stroke History: 2 Vascular Disease History: 1 Age Score: 1 Gender Score: 0       ASSESSMENT AND PLAN: Paroxysmal Atrial Fibrillation (ICD10:  I48.0) The patient's CHA2DS2-VASc score is 5, indicating a 7.2% annual risk of stroke.   S/p afib ablation 04/13/22 Patient appears to be maintaining SR Continue Eliquis  5 mg BID Continue diltiazem  120 mg daily with 30 mg PRN q 4 hours for heart racing. Continue propranolol  180 mg daily  Secondary Hypercoagulable State (ICD10:  D68.69) The patient is at significant risk for stroke/thromboembolism based upon his CHA2DS2-VASc Score of 5.  Continue Apixaban  (Eliquis ). No bleeding issues.   HTN Stable on current regimen  OSA  Encouraged nightly use of oral device.   Follow up in the AF clinic in one year.    Daril Kicks PA-C [facility] [address] [place], [place] [address] [phone] 04/22/2024 9:03 AM

## 2024-04-29 ENCOUNTER — Other Ambulatory Visit: Payer: Self-pay

## 2024-05-04 ENCOUNTER — Other Ambulatory Visit (HOSPITAL_COMMUNITY): Payer: Self-pay

## 2024-05-06 ENCOUNTER — Other Ambulatory Visit (HOSPITAL_COMMUNITY): Payer: Self-pay

## 2024-05-06 NOTE — Progress Notes (Signed)
 Specialty Pharmacy Refill Coordination Note  [name] [name] is a 68 y.o. male contacted today regarding refills of specialty medication(s) Ruxolitinib  Phosphate (JAKAFI )   Patient requested Delivery   Delivery date: 05/13/24   Verified address: [address]  [place]  [address]   Medication will be filled on: 05/12/24

## 2024-05-12 ENCOUNTER — Other Ambulatory Visit: Payer: Self-pay

## 2024-05-19 ENCOUNTER — Other Ambulatory Visit (HOSPITAL_COMMUNITY): Payer: Self-pay | Admitting: *Deleted

## 2024-05-19 DIAGNOSIS — R002 Palpitations: Secondary | ICD-10-CM

## 2024-05-19 MED ORDER — PROPRANOLOL HCL ER 160 MG PO CP24: 160.0000 mg | ORAL_CAPSULE | Freq: Every day | ORAL | 11 refills | Status: AC

## 2024-06-04 ENCOUNTER — Other Ambulatory Visit: Payer: Self-pay

## 2024-06-05 ENCOUNTER — Other Ambulatory Visit: Payer: Self-pay

## 2024-06-05 ENCOUNTER — Other Ambulatory Visit (HOSPITAL_COMMUNITY): Payer: Self-pay

## 2024-06-05 NOTE — Progress Notes (Signed)
 Specialty Pharmacy Refill Coordination Note  [name] [name] is a 69 y.o. male contacted today regarding refills of specialty medication(s) Ruxolitinib  Phosphate (JAKAFI )   Patient requested Pickup at [facility] [place] date: 06/09/24   Medication will be filled on: 06/08/24  Patient aware of $1909.14 copay and discussed options with Cassie. Patient agreed to pay OOP.

## 2024-07-20 ENCOUNTER — Ambulatory Visit: Admitting: Adult Health

## 2024-07-23 ENCOUNTER — Inpatient Hospital Stay: Attending: Hematology & Oncology

## 2024-07-23 ENCOUNTER — Inpatient Hospital Stay: Admitting: Hematology & Oncology
